# Patient Record
Sex: Male | Born: 1955 | Race: White | Hispanic: No | Marital: Single | Smoking: Former smoker
Health system: Southern US, Community
[De-identification: ages and names within clinical notes are randomized; demographics above are authoritative.]

## PROBLEM LIST (undated history)

## (undated) DIAGNOSIS — K219 Gastro-esophageal reflux disease without esophagitis: Secondary | ICD-10-CM

## (undated) DIAGNOSIS — I10 Essential (primary) hypertension: Secondary | ICD-10-CM

## (undated) DIAGNOSIS — J449 Chronic obstructive pulmonary disease, unspecified: Secondary | ICD-10-CM

## (undated) DIAGNOSIS — G4733 Obstructive sleep apnea (adult) (pediatric): Secondary | ICD-10-CM

## (undated) DIAGNOSIS — Z8619 Personal history of other infectious and parasitic diseases: Secondary | ICD-10-CM

## (undated) DIAGNOSIS — E785 Hyperlipidemia, unspecified: Secondary | ICD-10-CM

## (undated) HISTORY — DX: Essential (primary) hypertension: I10

## (undated) HISTORY — PX: ORTHOPEDIC SURGERY: SHX850

## (undated) HISTORY — DX: Gastro-esophageal reflux disease without esophagitis: K21.9

## (undated) HISTORY — DX: Hyperlipidemia, unspecified: E78.5

## (undated) HISTORY — DX: Obstructive sleep apnea (adult) (pediatric): G47.33

## (undated) HISTORY — PX: CATARACT EXTRACTION, BILATERAL: SHX1313

## (undated) HISTORY — DX: Personal history of other infectious and parasitic diseases: Z86.19

## (undated) HISTORY — PX: EYE SURGERY: SHX253

---

## 2000-08-08 ENCOUNTER — Encounter (INDEPENDENT_AMBULATORY_CARE_PROVIDER_SITE_OTHER): Payer: Self-pay

## 2000-08-08 ENCOUNTER — Ambulatory Visit (HOSPITAL_COMMUNITY): Admission: RE | Admit: 2000-08-08 | Discharge: 2000-08-08 | Payer: Self-pay | Admitting: Gastroenterology

## 2004-08-25 ENCOUNTER — Emergency Department (HOSPITAL_COMMUNITY): Admission: EM | Admit: 2004-08-25 | Discharge: 2004-08-25 | Payer: Self-pay | Admitting: Emergency Medicine

## 2008-08-04 ENCOUNTER — Inpatient Hospital Stay (HOSPITAL_COMMUNITY): Admission: EM | Admit: 2008-08-04 | Discharge: 2008-08-05 | Payer: Self-pay | Admitting: Emergency Medicine
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
## 2011-03-15 ENCOUNTER — Emergency Department (HOSPITAL_COMMUNITY)
Admission: EM | Admit: 2011-03-15 | Discharge: 2011-03-16 | Disposition: A | Discharge status: Home / Self Care | Payer: Self-pay | Attending: Emergency Medicine | Admitting: Emergency Medicine

## 2011-03-15 DIAGNOSIS — W540XXA Bitten by dog, initial encounter: Secondary | ICD-10-CM | POA: Insufficient documentation

## 2011-03-15 DIAGNOSIS — S01309A Unspecified open wound of unspecified ear, initial encounter: Secondary | ICD-10-CM | POA: Insufficient documentation

## 2011-06-22 LAB — POCT I-STAT, CHEM 8
Calcium, Ion: 1.04 — ABNORMAL LOW
HCT: 52
Hemoglobin: 17.7 — ABNORMAL HIGH
Sodium: 139

## 2011-06-22 LAB — TYPE AND SCREEN

## 2011-06-22 LAB — RAPID URINE DRUG SCREEN, HOSP PERFORMED: Benzodiazepines: POSITIVE — AB

## 2013-06-10 ENCOUNTER — Ambulatory Visit
Admission: RE | Admit: 2013-06-10 | Discharge: 2013-06-10 | Disposition: A | Discharge status: Home / Self Care | Payer: BC Managed Care – PPO | Source: Ambulatory Visit | Attending: Nurse Practitioner | Admitting: Nurse Practitioner

## 2013-06-10 ENCOUNTER — Other Ambulatory Visit: Payer: Self-pay | Admitting: Nurse Practitioner

## 2013-06-10 DIAGNOSIS — F17200 Nicotine dependence, unspecified, uncomplicated: Secondary | ICD-10-CM

## 2013-06-10 DIAGNOSIS — Z139 Encounter for screening, unspecified: Secondary | ICD-10-CM

## 2013-11-14 ENCOUNTER — Ambulatory Visit: Payer: Self-pay | Admitting: Podiatry

## 2013-11-20 ENCOUNTER — Encounter: Payer: Self-pay | Admitting: Podiatry

## 2013-11-20 ENCOUNTER — Ambulatory Visit (INDEPENDENT_AMBULATORY_CARE_PROVIDER_SITE_OTHER): Payer: BC Managed Care – PPO

## 2013-11-20 ENCOUNTER — Ambulatory Visit (INDEPENDENT_AMBULATORY_CARE_PROVIDER_SITE_OTHER): Payer: BC Managed Care – PPO | Admitting: Podiatry

## 2013-11-20 VITALS — BP 170/94 | HR 83 | Resp 18 | Ht 74.0 in | Wt 201.0 lb

## 2013-11-20 DIAGNOSIS — M79609 Pain in unspecified limb: Secondary | ICD-10-CM

## 2013-11-20 DIAGNOSIS — M7750 Other enthesopathy of unspecified foot: Secondary | ICD-10-CM

## 2013-11-20 DIAGNOSIS — M722 Plantar fascial fibromatosis: Secondary | ICD-10-CM

## 2013-11-20 DIAGNOSIS — M79606 Pain in leg, unspecified: Secondary | ICD-10-CM

## 2013-11-20 DIAGNOSIS — B372 Candidiasis of skin and nail: Secondary | ICD-10-CM

## 2013-11-20 MED ORDER — DICLOFENAC SODIUM 75 MG PO TBEC: 75.0000 mg | DELAYED_RELEASE_TABLET | Freq: Two times a day (BID) | ORAL | Status: DC

## 2013-11-20 MED ORDER — TERBINAFINE HCL 250 MG PO TABS: 250.0000 mg | ORAL_TABLET | Freq: Every day | ORAL | Status: DC

## 2013-11-20 MED ORDER — TRIAMCINOLONE ACETONIDE 10 MG/ML IJ SUSP
10.0000 mg | Freq: Once | INTRAMUSCULAR | Status: AC
  Administered 2013-11-20: 10 mg

## 2013-11-20 NOTE — Patient Instructions (Signed)

## 2013-11-20 NOTE — Progress Notes (Signed)
   Subjective:    Patient ID: [name], male    DOB: 1955-11-10, 58 y.o.   MRN: [id number]  HPI N B/L foot pain        L left dorsal midfoot, and right heel and inferior arch         D and O over 1 year        C left - swelling, pain             Right - pain         A left and right worse after rest and in the morning, but left has problems when stooping or climbing        T left foot lateral and left heel were injected, Ibuprofen and topical analgesic cream    Review of Systems  Constitutional: Negative.   HENT: Negative.   Eyes:       Cataracts and macular degeneration  Respiratory: Negative.   Cardiovascular: Negative.   Gastrointestinal: Negative.   Endocrine: Negative.   Musculoskeletal: Negative.   Skin: Positive for rash.       Athletes foot using Lotrimin   Allergic/Immunologic: Negative.   Neurological: Negative.        Objective:   Physical Exam        Assessment & Plan:

## 2013-11-21 NOTE — Progress Notes (Signed)
Subjective:     Patient ID: [name], male   DOB: 04-Aug-1956, 58 y.o.   MRN: [id number]  HPI patient presents stating he has been having heel pain in his right heel for about 6 months history of heel pain in his left heel with pain now in the dorsum of the foot and a history of injections by another doctor and skin condition left plantar foot and digits of several months duration   Review of Systems  All other systems reviewed and are negative.       Objective:   Physical Exam  Nursing note and vitals reviewed. Constitutional: He is oriented to person, place, and time.  Cardiovascular: Intact distal pulses.   Musculoskeletal: Normal range of motion.  Neurological: He is oriented to person, place, and time.  Skin: Skin is warm.   neurovascular status intact with range of motion adequate and mild equinus condition noted of both feet. Muscle strength adequate and I noted pain in the plantar heel right of a intense nature moderate discomfort in the left plantar heel and quite a bit of discomfort across the midtarsal joint left with inflammation noted. There is a patch of skin approximately 5 x 5 cm plantar aspect left arch and the digits have discoloration noted into through 5     Assessment:     Plantar fasciitis left heel with inflammation and probable compensatory tendinitis and inflammation dorsal left foot secondary to heel pain left along with dermatitis and mycotic infection left plantar foot and digits    Plan:     H&P and x-rays reviewed. Injected the right plantar heel 3 mg Kenalog 5 mg Xylocaine Marcaine mixture and the dorsum left foot 3 mg Kenalog 5 mg Xylocaine Marcaine mixture and dispensed fascial brace for the right foot. Discussed long-term orthotics and reviewed orthotic treatment and placed on full tear and 75 mg twice a day and Lamisil 250 mg daily for 30 days

## 2013-11-27 ENCOUNTER — Ambulatory Visit: Payer: BC Managed Care – PPO | Admitting: Podiatry

## 2013-12-04 ENCOUNTER — Ambulatory Visit (INDEPENDENT_AMBULATORY_CARE_PROVIDER_SITE_OTHER): Payer: BC Managed Care – PPO | Admitting: Podiatry

## 2013-12-04 VITALS — BP 150/96 | HR 87 | Resp 16 | Ht 72.0 in | Wt 200.0 lb

## 2013-12-04 DIAGNOSIS — M722 Plantar fascial fibromatosis: Secondary | ICD-10-CM

## 2013-12-04 NOTE — Progress Notes (Signed)
Subjective:     Patient ID: [name], male   DOB: 1956-06-28, 58 y.o.   MRN: [id number]  HPI patient presents stating my heels are feeling better but I still have pain if him on them too much or if I am to act   Review of Systems     Objective:   Physical Exam Neurovascular status unchanged with significant diminishment of pain in the plantar heel region of both feet with inflammation still noted upon deep palpation    Assessment:     Plantar fasciitis improved both feet but still present    Plan:     Explained the biomechanical nature of his condition and scanned for custom orthotics to reduce stress against his heels. Continue home physical therapy and brace usage and reappoint when they are complete

## 2013-12-27 ENCOUNTER — Ambulatory Visit (INDEPENDENT_AMBULATORY_CARE_PROVIDER_SITE_OTHER): Payer: BC Managed Care – PPO | Admitting: *Deleted

## 2013-12-27 DIAGNOSIS — M722 Plantar fascial fibromatosis: Secondary | ICD-10-CM

## 2013-12-27 NOTE — Progress Notes (Signed)
Dispensed patient's orthotics with oral and written instructions for wearing. Patient will follow up with Dr. Regal in 1 month for an orthotic check. 

## 2013-12-27 NOTE — Patient Instructions (Signed)

## 2014-01-15 ENCOUNTER — Ambulatory Visit: Payer: BC Managed Care – PPO | Admitting: Podiatry

## 2014-01-22 ENCOUNTER — Ambulatory Visit: Payer: BC Managed Care – PPO | Admitting: Podiatry

## 2014-01-27 ENCOUNTER — Ambulatory Visit (INDEPENDENT_AMBULATORY_CARE_PROVIDER_SITE_OTHER): Payer: BC Managed Care – PPO | Admitting: Podiatry

## 2014-01-27 ENCOUNTER — Encounter: Payer: Self-pay | Admitting: Podiatry

## 2014-01-27 VITALS — BP 165/95 | HR 84 | Resp 16

## 2014-01-27 DIAGNOSIS — M722 Plantar fascial fibromatosis: Secondary | ICD-10-CM

## 2014-01-27 MED ORDER — DICLOFENAC SODIUM 75 MG PO TBEC: 75.0000 mg | DELAYED_RELEASE_TABLET | Freq: Two times a day (BID) | ORAL | Status: DC

## 2014-01-27 NOTE — Progress Notes (Signed)
Subjective:     Patient ID: [name], male   DOB: 06/22/1956, 58 y.o.   MRN: [id number]  HPI patient states my heels are much better but still gets sore at the end of the day and the oral medicine seems to be helping   Review of Systems     Objective:   Physical Exam Neurovascular status intact no health history changes noted with minimal discomfort upon palpation of the plantar fascia    Assessment:     Improved plantar fasciitis of the heel region both feet    Plan:     Continue tear and at this time and advised on physical therapy anti-inflammatories and continued orthotic usage. Patient will be seen back if symptoms indicate

## 2014-01-28 ENCOUNTER — Encounter: Payer: Self-pay | Admitting: Podiatry

## 2014-01-28 NOTE — Progress Notes (Signed)
Prescription refill came over fax for lamisil 250 mg , per dr Charlsie Merlesregal no more

## 2014-02-19 ENCOUNTER — Ambulatory Visit: Payer: BC Managed Care – PPO | Admitting: Podiatry

## 2014-02-26 ENCOUNTER — Ambulatory Visit (INDEPENDENT_AMBULATORY_CARE_PROVIDER_SITE_OTHER): Payer: BC Managed Care – PPO | Admitting: Podiatry

## 2014-02-26 ENCOUNTER — Encounter: Payer: Self-pay | Admitting: Podiatry

## 2014-02-26 VITALS — BP 106/70 | HR 85 | Resp 12

## 2014-02-26 DIAGNOSIS — M722 Plantar fascial fibromatosis: Secondary | ICD-10-CM

## 2014-02-26 MED ORDER — TRIAMCINOLONE ACETONIDE 10 MG/ML IJ SUSP
10.0000 mg | Freq: Once | INTRAMUSCULAR | Status: AC
  Administered 2014-02-26: 10 mg

## 2014-02-26 NOTE — Progress Notes (Signed)
Subjective:     Patient ID: [name], male   DOB: 05/19/1956, 58 y.o.   MRN: [id number]  HPI patient states my heel has started to hurt again and I know I may need some kind of support   Review of Systems     Objective:   Physical Exam Neurovascular status intact with discomfort in the plantar heel region right at the insertional point in the calcaneus with biomechanical flattening the arch upon weight    Assessment:     Plan her fasciitis right with inflammation and fluid around the medial band    Plan:     H&P and x-ray reviewed and today I injected the right plantar fascia 3 mg Kenalog 5 mg like Marcaine mixture and scanned for custom orthotics to reduce all plantar pressure on the arch and foot

## 2014-03-06 ENCOUNTER — Emergency Department (HOSPITAL_COMMUNITY): Payer: BC Managed Care – PPO

## 2014-03-06 ENCOUNTER — Emergency Department (HOSPITAL_COMMUNITY)
Admission: EM | Admit: 2014-03-06 | Discharge: 2014-03-06 | Disposition: A | Discharge status: Home / Self Care | Payer: BC Managed Care – PPO | Attending: Emergency Medicine | Admitting: Emergency Medicine

## 2014-03-06 ENCOUNTER — Encounter (HOSPITAL_COMMUNITY): Payer: Self-pay | Admitting: Emergency Medicine

## 2014-03-06 DIAGNOSIS — K219 Gastro-esophageal reflux disease without esophagitis: Secondary | ICD-10-CM | POA: Insufficient documentation

## 2014-03-06 DIAGNOSIS — I10 Essential (primary) hypertension: Secondary | ICD-10-CM | POA: Insufficient documentation

## 2014-03-06 DIAGNOSIS — Z792 Long term (current) use of antibiotics: Secondary | ICD-10-CM | POA: Insufficient documentation

## 2014-03-06 DIAGNOSIS — R059 Cough, unspecified: Secondary | ICD-10-CM | POA: Insufficient documentation

## 2014-03-06 DIAGNOSIS — Z79899 Other long term (current) drug therapy: Secondary | ICD-10-CM | POA: Insufficient documentation

## 2014-03-06 DIAGNOSIS — A770 Spotted fever due to Rickettsia rickettsii: Secondary | ICD-10-CM

## 2014-03-06 DIAGNOSIS — Z791 Long term (current) use of non-steroidal anti-inflammatories (NSAID): Secondary | ICD-10-CM | POA: Insufficient documentation

## 2014-03-06 DIAGNOSIS — Z87891 Personal history of nicotine dependence: Secondary | ICD-10-CM | POA: Insufficient documentation

## 2014-03-06 DIAGNOSIS — R05 Cough: Secondary | ICD-10-CM | POA: Insufficient documentation

## 2014-03-06 DIAGNOSIS — A938 Other specified arthropod-borne viral fevers: Secondary | ICD-10-CM | POA: Insufficient documentation

## 2014-03-06 DIAGNOSIS — J441 Chronic obstructive pulmonary disease with (acute) exacerbation: Secondary | ICD-10-CM

## 2014-03-06 HISTORY — DX: Chronic obstructive pulmonary disease, unspecified: J44.9

## 2014-03-06 LAB — I-STAT TROPONIN, ED
TROPONIN I, POC: 0 ng/mL (ref 0.00–0.08)
TROPONIN I, POC: 0.01 ng/mL (ref 0.00–0.08)

## 2014-03-06 LAB — BASIC METABOLIC PANEL
BUN: 15 mg/dL (ref 6–23)
CHLORIDE: 99 meq/L (ref 96–112)
CO2: 18 mEq/L — ABNORMAL LOW (ref 19–32)
Calcium: 9.2 mg/dL (ref 8.4–10.5)
Creatinine, Ser: 0.75 mg/dL (ref 0.50–1.35)
GFR calc Af Amer: >90 mL/min (ref >90)
GFR calc non Af Amer: >90 mL/min (ref >90)
Glucose, Bld: 133 mg/dL — ABNORMAL HIGH (ref 70–99)
Potassium: 4.3 mEq/L (ref 3.7–5.3)
SODIUM: 132 meq/L — AB (ref 137–147)

## 2014-03-06 LAB — CBC
HEMATOCRIT: 35.1 % — AB (ref 39.0–52.0)
HEMOGLOBIN: 12.4 g/dL — AB (ref 13.0–17.0)
MCH: 33.9 pg (ref 26.0–34.0)
MCHC: 35.3 g/dL (ref 30.0–36.0)
MCV: 95.9 fL (ref 78.0–100.0)
Platelets: 142 10*3/uL — ABNORMAL LOW (ref 150–400)
RBC: 3.66 MIL/uL — ABNORMAL LOW (ref 4.22–5.81)
RDW: 12.5 % (ref 11.5–15.5)
WBC: 9.7 10*3/uL (ref 4.0–10.5)

## 2014-03-06 MED ORDER — DOXYCYCLINE HYCLATE 100 MG PO CAPS: 100.0000 mg | ORAL_CAPSULE | Freq: Two times a day (BID) | ORAL | Status: DC

## 2014-03-06 MED ORDER — ALBUTEROL SULFATE (2.5 MG/3ML) 0.083% IN NEBU
2.5000 mg | INHALATION_SOLUTION | Freq: Once | RESPIRATORY_TRACT | Status: AC
  Administered 2014-03-06: 2.5 mg via RESPIRATORY_TRACT
  Filled 2014-03-06: qty 3

## 2014-03-06 NOTE — ED Notes (Signed)
Pt states he woke up last night with SOB and has been dx with COPD. Pt states he had to use his inhalers last night, knows one of them is albuterol. Could not tell difference afterwards. Also reports 2 weeks ago pulled a tick off of his groin without any markings but wanted to make us aware.

## 2014-03-06 NOTE — ED Notes (Signed)
Patient declined wheelchair. RN escorted to exit 

## 2014-03-06 NOTE — ED Notes (Signed)
Pt ambulated to the bathroom with minimal assistance. Pulse was 110 and oxygen 97

## 2014-03-06 NOTE — ED Notes (Signed)
Pt states that he was outside in the heat and felt like he was struggling to take deep breaths.  Denies any pain.

## 2014-03-06 NOTE — ED Provider Notes (Signed)
CSN: [id number]     Arrival date & time 03/06/14  1337 History   First MD Initiated Contact with Patient 03/06/14 1743     Chief Complaint  Patient presents with  . Shortness of Breath     (Consider location/radiation/quality/duration/timing/severity/associated sxs/prior Treatment) Patient is a 58 y.o. male presenting with shortness of breath. The history is provided by the patient and the spouse.  Shortness of Breath Severity:  Moderate Onset quality:  Gradual Timing:  Constant Progression:  Worsening Chronicity:  New Relieved by:  Inhaler and rest Worsened by:  Activity Associated symptoms: cough (chronic) and wheezing   Associated symptoms: no abdominal pain, no chest pain, no fever, no headaches and no vomiting     58 yo male pw fatigue and SOB. Tick exposure 1-2 weeks ago. Had been there for about 7-10 days per wife. Did not initially realize it was a tick. Removed. Fatigue since this time. Last night felt worsening sob from chronic sob (COPD). Chronic cough. No fevers. No chest pain. No lower extremity edema or leg pain. No h/o DVT/PE.  Dyspnea worse on exertion.  Tried home albuterol with mild relief.   Past Medical History  Diagnosis Date  . Hypertension   . GERD (gastroesophageal reflux disease)   . COPD (chronic obstructive pulmonary disease)    Past Surgical History  Procedure Laterality Date  . Orthopedic surgery Bilateral     pt states fracture repairs to arms and legs.  . Eye surgery Bilateral   . Cataract extraction, bilateral     Family History  Problem Relation Age of Onset  . Heart disease Mother   . Hypertension Father    History  Substance Use Topics  . Smoking status: Former Games developer  . Smokeless tobacco: Not on file  . Alcohol Use: 3.0 oz/week    5 Cans of beer per week     Comment: daily    Review of Systems  Constitutional: Negative for fever and chills.  HENT: Negative for congestion and rhinorrhea.   Eyes: Negative for pain and visual  disturbance.  Respiratory: Positive for cough (chronic), shortness of breath and wheezing.   Cardiovascular: Negative for chest pain and leg swelling.  Gastrointestinal: Negative for nausea, vomiting, abdominal pain and diarrhea.  Genitourinary: Negative for flank pain and difficulty urinating.  Musculoskeletal: Negative for back pain.  Neurological: Negative for dizziness and headaches.  All other systems reviewed and are negative.     Allergies  Review of patient's allergies indicates no known allergies.  Home Medications   Prior to Admission medications   Medication Sig Start Date End Date Taking? Authorizing Provider  Aclidinium Bromide (TUDORZA PRESSAIR IN) Inhale 1 puff into the lungs daily.   Yes Historical Provider, MD  albuterol (PROVENTIL HFA;VENTOLIN HFA) 108 (90 BASE) MCG/ACT inhaler Inhale 2 puffs into the lungs every 6 (six) hours as needed for wheezing or shortness of breath.   Yes Historical Provider, MD  diclofenac (VOLTAREN) 75 MG EC tablet Take 75 mg by mouth 2 (two) times daily. In the morning and in the afternoon   Yes Historical Provider, MD  hydroxypropyl methylcellulose (ISOPTO TEARS) 2.5 % ophthalmic solution Place 2 drops into the left eye daily as needed for dry eyes.   Yes Historical Provider, MD  ibuprofen (ADVIL,MOTRIN) 200 MG tablet Take 400 mg by mouth every 6 (six) hours as needed for moderate pain.   Yes Historical Provider, MD  lisinopril (PRINIVIL,ZESTRIL) 20 MG tablet Take 20 mg by mouth every morning.  Yes Historical Provider, MD  Multiple Vitamins-Minerals (MEGA MULTI MEN PO) Take 1 tablet by mouth daily.   Yes Historical Provider, MD  pantoprazole (PROTONIX) 40 MG tablet Take 40 mg by mouth every morning.   Yes Historical Provider, MD  doxycycline (VIBRAMYCIN) 100 MG capsule Take 1 capsule (100 mg total) by mouth 2 (two) times daily. One po bid x 7 days 03/06/14   Stevie Kernyan McLennan, MD   BP 127/75  Pulse 119  Temp(Src) 98.5 F (36.9 C) (Oral)  Resp  18  SpO2 97% Physical Exam  Nursing note and vitals reviewed. Constitutional: He is oriented to person, place, and time. He appears well-developed and well-nourished. No distress.  HENT:  Head: Normocephalic and atraumatic.  Eyes: Conjunctivae are normal. Right eye exhibits no discharge. Left eye exhibits no discharge.  Neck: No tracheal deviation present.  Cardiovascular: Normal rate, regular rhythm, normal heart sounds and intact distal pulses.   Pulmonary/Chest: Effort normal. No stridor. No respiratory distress. He has wheezes (mild. diffuse). He has no rales.  Abdominal: Soft. He exhibits no distension. There is no tenderness. There is no guarding.  Musculoskeletal: He exhibits no edema and no tenderness.  Neurological: He is alert and oriented to person, place, and time.  Skin: Skin is warm and dry.  Psychiatric: He has a normal mood and affect. His behavior is normal.    ED Course  Procedures (including critical care time) Labs Review Labs Reviewed  BASIC METABOLIC PANEL - Abnormal; Notable for the following:    Sodium 132 (*)    CO2 18 (*)    Glucose, Bld 133 (*)    All other components within normal limits  CBC - Abnormal; Notable for the following:    RBC 3.66 (*)    Hemoglobin 12.4 (*)    HCT 35.1 (*)    Platelets 142 (*)    All other components within normal limits  I-STAT TROPOININ, ED  I-STAT TROPOININ, ED    Imaging Review Dg Chest 2 View (if Patient Has Fever And/or Copd)  03/06/2014   CLINICAL DATA:  Productive cough.  Emphysema.  EXAM: CHEST  2 VIEW  COMPARISON:  06/10/2013  FINDINGS: The cardiac silhouette, mediastinal and hilar contours are within normal limits and stable. There is mild tortuosity of the thoracic aorta. Mild emphysematous changes are again demonstrated. No definite acute overlying pulmonary process. There is artifact overlying the left lower chest. The bony thorax is intact.  IMPRESSION: Emphysematous changes and pulmonary scarring but no  definite acute overlying pulmonary process.   Electronically Signed   By: Loralie ChampagneMark  Gallerani M.D.   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 Onset after tick exposure.  HDS, af. Except for tachycardic from 90-110 (triage HR 119, improved upon my evaluation).  Mild wheezing. Resolved after albuterol neb. Not hypoxic during ED course.  No CP. EKG reassuring. Troponin neg x2. Doubt ACS Low risk for PE. Do not believe further imaging indicated at this time.  Tachycardia likely 2/2 beta agonist use.  Hyponatremia and thrombocytopenia on labs. Findings in conjunction with general illness after tick exposure raise suspicion for RMSF. Will treat with doxycycline.   Patient discharged home. Return precautions given. To follow up with PCP. Patient and spouse in agreement with plan.  Discussed case with  Dr. Rubin PayorPickering who is in agreement with assessment and plan.     Stevie Kernyan McLennan, MD 03/07/14 [phone]

## 2014-03-06 NOTE — Discharge Instructions (Signed)
Chronic Obstructive Pulmonary Disease Exacerbation Chronic obstructive pulmonary disease (COPD) is a common lung condition in which airflow from the lungs is limited. COPD is a general term that can be used to describe many different lung problems that limit airflow, including chronic bronchitis and emphysema. COPD exacerbations are episodes when breathing symptoms become much worse and require extra treatment. Without treatment, COPD exacerbations can be life threatening, and frequent COPD exacerbations can cause further damage to your lungs. CAUSES   Respiratory infections.   Exposure to smoke.   Exposure to air pollution, chemical fumes, or dust. Sometimes there is no apparent cause or trigger. RISK FACTORS  Smoking cigarettes.  Older age.  Frequent prior COPD exacerbations. SIGNS AND SYMPTOMS   Increased coughing.   Increased thick spit (sputum) production.   Increased wheezing.   Increased shortness of breath.   Rapid breathing.   Chest tightness. DIAGNOSIS  Your medical history, a physical exam, and tests will help your health care provider make a diagnosis. Tests may include:  A chest X-ray.  Basic lab tests.  Sputum testing.  An arterial blood gas test. TREATMENT  Depending on the severity of your COPD exacerbation, you may need to be admitted to a hospital for treatment. Some of the treatments commonly used to treat COPD exacerbations are:   Antibiotic medicines.   Bronchodilators. These are drugs that expand the air passages. They may be given with an inhaler or nebulizer. Spacer devices may be needed to help improve drug delivery.  Corticosteroid medicines.  Supplemental oxygen therapy.  HOME CARE INSTRUCTIONS   Do not smoke. Quitting smoking is very important to prevent COPD from getting worse and exacerbations from happening as often.  Avoid exposure to all substances that irritate the airway, especially to tobacco smoke.   If prescribed,  take your antibiotics as directed. Finish them even if you start to feel better.  Only take over-the-counter or prescription medicines as directed by your health care provider.It is important to use correct technique with inhaled medicines.  Drink enough fluids to keep your urine clear or pale yellow (unless you have a medical condition that requires fluid restriction).  Use a cool mist vaporizer. This makes it easier to clear your chest when you cough.   If you have a home nebulizer and oxygen, continue to use them as directed.   Maintain all necessary vaccinations to prevent infections.   Exercise regularly.   Eat a healthy diet.   Keep all follow-up appointments as directed by your health care provider. SEEK IMMEDIATE MEDICAL CARE IF:  You have worsening shortness of breath.   You have trouble talking.   You have severe chest pain.  You have blood in your sputum.  You have a fever.  You have weakness, vomit repeatedly, or faint.   You feel confused.   You continue to get worse. MAKE SURE YOU:   Understand these instructions.  Will watch your condition.  Will get help right away if you are not doing well or get worse. Document Released: 07/03/2007 Document Revised: 06/26/2013 Document Reviewed: 05/10/2013 [facility] Patient Information 2015 [place], [place]. This information is not intended to replace advice given to you by your health care provider. Make sure you discuss any questions you have with your health care provider.  [place] Spotted Fever [place] Spotted Fever (RMSF) is the oldest known tick-borne disease of people in the [place]. This disease was named because it was first described [place]  who had an illness characterized by a rash with red-purple-black spots. This disease is caused by a rickettsia (Rickettsia rickettsii), a bacteria carried by the tick. The [facility] wood tick and the  American dog tick, acquire and transmit the RMSF bacteria (pictures NOT actual size). When a larval, nymphal or adult tick feeds on an infected rodent or larger animal, the tick can become infected. Infected adult ticks then feed on people who may then get RMSF. The tick transmits the disease to humans during a prolonged period of feeding that lasts many hours, days or even a couple weeks. The bite is painless and frequently goes unnoticed. An infected male tick may also pass the rickettsial bacteria to her eggs that then may mature to be infected adult ticks. The rickettsia that causes RMSF can also get into a person's body through damaged skin. A tick bite is not necessary. People can get RMSF if they crush a tick and get it's blood or body fluids on their skin through a small cut or sore.  DIAGNOSIS Diagnosis is made by laboratory tests.  TREATMENT Treatment is with antibiotics (medications that kill rickettsia and other bacteria). Immediate treatment usually prevents death. GEOGRAPHIC RANGE This disease was reported only in the [facility] until 1931. RMSF has more recently been described among individuals in all states except [place], [place] and [place]. The highest reported incidences of RMSF now occur among residents of [place], [place], [place] and [address]. TIME OF YEAR  Most cases are diagnosed during late spring and summer when ticks are most active. However, especially in the warmer [place] states, a few cases occur during the winter. SYMPTOMS   Symptoms of RMSF begin from 2 to 14 days after a tick bite. The most common early symptoms are fever, muscle aches and headache followed by nausea (feeling sick to your stomach) or vomiting.  The RMSF rash is typically delayed until 3 or more days after symptom onset, and eventually develops in 9 of 10 infected patients by the 5th day of illness. If the disease is not treated it can cause death. If you get a fever, headache, muscle  aches, rash, nausea or vomiting within 2 weeks of a possible tick bite or exposure you should see your caregiver immediately. PREVENTION Ticks prefer to hide in shady, moist ground litter. They can often be found above the ground clinging to tall grass, brush, shrubs and low tree branches. They also inhabit lawns and gardens, especially at the edges of woodlands and around old stone walls. Within the areas where ticks generally live, no naturally vegetated area can be considered completely free of infected ticks. The best precaution against RMSF is to avoid contact with soil, leaf litter and vegetation as much as possible in tick infested areas. For those who enjoy gardening or walking in their yards, clear brush and mow tall grass around houses and at the edges of gardens. This may help reduce the tick population in the immediate area. Applications of chemical insecticides by a licensed professional in the spring (late May) and Fall (September) will also control ticks, especially in heavily infested areas. Treatment will never get rid of all the ticks. Getting rid of small animal populations that host ticks will also decrease the tick population. When working in the garden, [employer], or handling soil and vegetation, wear light-colored protective clothing and gloves. Spot-check often to prevent ticks from reaching the skin. Ticks cannot jump or fly. They will not drop from an above-ground perch onto  a passing animal. Once a tick gains access to human skin it climbs upward until it reaches a more protected area. For example, the back of the knee, groin, navel, armpit, ears or nape of the neck. It then begins the slow process of embedding itself in the skin. Campers, hikers, field workers, and others who spend time in wooded, brushy or tall grassy areas can avoid exposure to ticks by using the following precautions:  Wear light-colored clothing with a tight weave to spot ticks more easily and prevent  contact with the skin.  Wear long pants tucked into socks, long-sleeved shirts tucked into pants and enclosed shoes or boots along with insect repellent.  Spray clothes with insect repellent containing either DEET or Permethrin. Only DEET can be used on exposed skin. Follow the manufacturer's directions carefully.  Wear a hat and keep long hair pulled back.  Stay on cleared, well-worn trails whenever possible.  Spot-check yourself and others often for the presence of ticks on clothes. If you find one, there are likely to be others. Check thoroughly.  Remove clothes after leaving tick-infested areas. If possible, wash them to eliminate any unseen ticks. Check yourself, your children and any pets from head to toe for the presence of ticks.  Shower and shampoo. You can greatly reduce your chances of contracting RMSF if you remove attached ticks as soon as possible. Regular checks of the body, including all body sites covered by hair (head, armpits, genitals), allow removal of the tick before rickettsial transmission. To remove an attached tick, use a forceps or tweezers to detach the intact tick without leaving mouth parts in the skin. The tick bite wound should be cleansed after tick removal. Remember the most common symptoms of RMSF are fever, muscle aches, headache and nausea or vomiting with a later onset of rash. If you get these symptoms after a tick bite and while living in an area where RMSF is found, RMSF should be suspected. If the disease is not treated, it can cause death. See your caregiver immediately if you get these symptoms. Do this even if not aware of a tick bite. Document Released: 12/18/2000 Document Revised: 11/28/2011 Document Reviewed: 08/10/2009 [facility] Patient Information 2015 [place], [place]. This information is not intended to replace advice given to you by your health care provider. Make sure you discuss any questions you have with your health care provider.

## 2014-03-10 NOTE — ED Provider Notes (Signed)
I saw and evaluated the patient, reviewed the resident's note and I agree with the findings and plan.   EKG Interpretation   Date/Time:  Thursday March 06 2014 13:46:48 EDT Ventricular Rate:  118 PR Interval:  162 QRS Duration: 84 QT Interval:  316 QTC Calculation: 442 R Axis:   92 Text Interpretation:   Suspect arm lead reversal, interpretation  assumes no reversal Sinus tachycardia Rightward axis Possible Anterior  infarct , age undetermined Reconfirmed by [facility] Surgery CenterCKERING  MD, NATHAN [phone]([id number]) on  03/10/2014 6:58:03 AM     Patient shortness of breath. Tachycardia has improved. His history of COPD. He had tick exposure. We'll treat for discharge  Juliet Rudeathan R. Rubin PayorPickering, MD 03/10/14 [phone]

## 2014-03-19 ENCOUNTER — Ambulatory Visit: Payer: BC Managed Care – PPO | Admitting: Podiatry

## 2014-04-07 ENCOUNTER — Ambulatory Visit: Payer: BC Managed Care – PPO | Admitting: Podiatry

## 2014-04-10 ENCOUNTER — Ambulatory Visit (INDEPENDENT_AMBULATORY_CARE_PROVIDER_SITE_OTHER): Payer: BC Managed Care – PPO | Admitting: Podiatry

## 2014-04-10 ENCOUNTER — Encounter: Payer: Self-pay | Admitting: Podiatry

## 2014-04-10 VITALS — BP 121/86 | HR 90 | Resp 17

## 2014-04-10 DIAGNOSIS — M722 Plantar fascial fibromatosis: Secondary | ICD-10-CM

## 2014-04-10 MED ORDER — TRIAMCINOLONE ACETONIDE 10 MG/ML IJ SUSP
10.0000 mg | Freq: Once | INTRAMUSCULAR | Status: AC
  Administered 2014-04-10: 10 mg

## 2014-04-10 MED ORDER — PREDNISONE 10 MG PO TABS: ORAL_TABLET | ORAL | Status: DC

## 2014-04-11 NOTE — Progress Notes (Signed)
Subjective:     Patient ID: [name], male   DOB: 09-02-1956, 58 y.o.   MRN: [id number]  HPI patient states of the right heel is still really bothering him and it's worse when he is working its worse after work and it's now hurting 24 hours a day but he is very busy on cement floors   Review of Systems     Objective:   Physical Exam Neurovascular status intact with health history unchanged and exquisite discomfort plantar aspect right heel at the insertional point of the tendon into the calcaneus    Assessment:     Continue plan her fasciitis right to get irritated by extreme weightbearing hours    Plan:     We're trying to help the CPM off this area and today I did dispense air fracture walker with instructions to use as much as possible and I did dispense a fascially brace with instructions on usage. I injected the plantar fascia right 3 mg Kenalog 5 mg  Marcaine mixture and discussed continued ice therapy and supportive shoes

## 2014-04-24 ENCOUNTER — Ambulatory Visit (INDEPENDENT_AMBULATORY_CARE_PROVIDER_SITE_OTHER): Payer: BC Managed Care – PPO | Admitting: Podiatry

## 2014-04-24 ENCOUNTER — Encounter: Payer: Self-pay | Admitting: Podiatry

## 2014-04-24 VITALS — BP 137/89 | HR 91 | Resp 16

## 2014-04-24 DIAGNOSIS — M722 Plantar fascial fibromatosis: Secondary | ICD-10-CM

## 2014-04-24 NOTE — Progress Notes (Signed)
Subjective:     Patient ID: [name], male   DOB: September 26, 1955, 58 y.o.   MRN: [id number]  HPI patient states I'm doing a lot better with my foot and I feel like I'm able to walk normally without intense discomfort. States he is still using his boot and splint   Review of Systems     Objective:   Physical Exam Neurovascular status intact with muscle strength adequate in range of motion within normal limits. Plantar pain has reduced quite nicely upon palpation    Assessment:     Improved plantar fasciitis right heel    Plan:     Instructed on physical therapy continued boot usage and splint usage area reappoint for us to recheck if symptoms were to recur

## 2014-08-19 ENCOUNTER — Other Ambulatory Visit: Payer: Self-pay | Admitting: *Deleted

## 2014-08-19 MED ORDER — DICLOFENAC SODIUM 75 MG PO TBEC: 75.0000 mg | DELAYED_RELEASE_TABLET | Freq: Two times a day (BID) | ORAL | Status: DC

## 2014-08-19 NOTE — Telephone Encounter (Signed)
[facility] sent refill request for diclofenac 75 mg #60. Take one by mouth twice daily. Per dr Charlsie Merlesregal refill plus 3 refills.

## 2014-09-15 ENCOUNTER — Emergency Department (HOSPITAL_COMMUNITY): Payer: BC Managed Care – PPO

## 2014-09-15 ENCOUNTER — Emergency Department (HOSPITAL_COMMUNITY)
Admission: EM | Admit: 2014-09-15 | Discharge: 2014-09-15 | Disposition: A | Discharge status: Home / Self Care | Payer: BC Managed Care – PPO | Attending: Emergency Medicine | Admitting: Emergency Medicine

## 2014-09-15 ENCOUNTER — Encounter (HOSPITAL_COMMUNITY): Payer: Self-pay | Admitting: Family Medicine

## 2014-09-15 DIAGNOSIS — K219 Gastro-esophageal reflux disease without esophagitis: Secondary | ICD-10-CM | POA: Diagnosis not present

## 2014-09-15 DIAGNOSIS — R911 Solitary pulmonary nodule: Secondary | ICD-10-CM | POA: Insufficient documentation

## 2014-09-15 DIAGNOSIS — J441 Chronic obstructive pulmonary disease with (acute) exacerbation: Secondary | ICD-10-CM | POA: Diagnosis not present

## 2014-09-15 DIAGNOSIS — Z87891 Personal history of nicotine dependence: Secondary | ICD-10-CM | POA: Insufficient documentation

## 2014-09-15 DIAGNOSIS — Z791 Long term (current) use of non-steroidal anti-inflammatories (NSAID): Secondary | ICD-10-CM | POA: Diagnosis not present

## 2014-09-15 DIAGNOSIS — Z7952 Long term (current) use of systemic steroids: Secondary | ICD-10-CM | POA: Insufficient documentation

## 2014-09-15 DIAGNOSIS — R0602 Shortness of breath: Secondary | ICD-10-CM | POA: Diagnosis present

## 2014-09-15 DIAGNOSIS — I10 Essential (primary) hypertension: Secondary | ICD-10-CM | POA: Insufficient documentation

## 2014-09-15 DIAGNOSIS — Z79899 Other long term (current) drug therapy: Secondary | ICD-10-CM | POA: Insufficient documentation

## 2014-09-15 LAB — BASIC METABOLIC PANEL
ANION GAP: 8 (ref 5–15)
BUN: 10 mg/dL (ref 6–23)
CALCIUM: 8.6 mg/dL (ref 8.4–10.5)
CO2: 18 mmol/L — ABNORMAL LOW (ref 19–32)
Chloride: 107 mEq/L (ref 96–112)
Creatinine, Ser: 0.82 mg/dL (ref 0.50–1.35)
Glucose, Bld: 139 mg/dL — ABNORMAL HIGH (ref 70–99)
POTASSIUM: 3.9 mmol/L (ref 3.5–5.1)
SODIUM: 133 mmol/L — AB (ref 135–145)

## 2014-09-15 LAB — I-STAT TROPONIN, ED: TROPONIN I, POC: 0 ng/mL (ref 0.00–0.08)

## 2014-09-15 LAB — CBC
HCT: 42.2 % (ref 39.0–52.0)
Hemoglobin: 14.8 g/dL (ref 13.0–17.0)
MCH: 34.2 pg — ABNORMAL HIGH (ref 26.0–34.0)
MCHC: 35.1 g/dL (ref 30.0–36.0)
MCV: 97.5 fL (ref 78.0–100.0)
PLATELETS: 119 10*3/uL — AB (ref 150–400)
RBC: 4.33 MIL/uL (ref 4.22–5.81)
RDW: 12.7 % (ref 11.5–15.5)
WBC: 6.6 10*3/uL (ref 4.0–10.5)

## 2014-09-15 MED ORDER — IBUPROFEN 400 MG PO TABS
600.0000 mg | ORAL_TABLET | Freq: Once | ORAL | Status: AC
  Administered 2014-09-15: 600 mg via ORAL
  Filled 2014-09-15 (×2): qty 1

## 2014-09-15 MED ORDER — PREDNISONE 20 MG PO TABS: 20.0000 mg | ORAL_TABLET | Freq: Two times a day (BID) | ORAL | Status: DC

## 2014-09-15 MED ORDER — SODIUM CHLORIDE 0.9 % IV BOLUS (SEPSIS)
1000.0000 mL | Freq: Once | INTRAVENOUS | Status: AC
  Administered 2014-09-15: 1000 mL via INTRAVENOUS

## 2014-09-15 MED ORDER — ACETAMINOPHEN 325 MG PO TABS
650.0000 mg | ORAL_TABLET | Freq: Once | ORAL | Status: DC
  Filled 2014-09-15: qty 2

## 2014-09-15 MED ORDER — ALBUTEROL SULFATE (2.5 MG/3ML) 0.083% IN NEBU
5.0000 mg | INHALATION_SOLUTION | Freq: Once | RESPIRATORY_TRACT | Status: AC
  Administered 2014-09-15: 2.5 mg via RESPIRATORY_TRACT
  Filled 2014-09-15: qty 6

## 2014-09-15 MED ORDER — IPRATROPIUM-ALBUTEROL 0.5-2.5 (3) MG/3ML IN SOLN
3.0000 mL | Freq: Once | RESPIRATORY_TRACT | Status: AC
  Administered 2014-09-15: 3 mL via RESPIRATORY_TRACT
  Filled 2014-09-15: qty 3

## 2014-09-15 MED ORDER — PREDNISONE 20 MG PO TABS
60.0000 mg | ORAL_TABLET | Freq: Once | ORAL | Status: AC
  Administered 2014-09-15: 60 mg via ORAL
  Filled 2014-09-15: qty 3

## 2014-09-15 NOTE — ED Notes (Signed)
Per pt SOB and he woke himself up gasping for air last night. sts coughing up yellow mucous. sts severe cough. sts using puffers.

## 2014-09-15 NOTE — Discharge Instructions (Signed)
Chronic Asthmatic Bronchitis Chronic asthmatic bronchitis is a complication of persistent asthma. After a period of time with asthma, some people develop airflow obstruction that is present all the time, even when not having an asthma attack.There is also persistent inflammation of the airways, and the bronchial tubes produce more mucus. Chronic asthmatic bronchitis usually is a permanent problem with the lungs. CAUSES  Chronic asthmatic bronchitis happens most often in people who have asthma and also smoke cigarettes. Occasionally, it can happen to a person with long-standing or severe asthma even if the person is not a smoker. SIGNS AND SYMPTOMS  Chronic asthmatic bronchitis usually causes symptoms of both asthma and chronic bronchitis, including:   Coughing.  Increased sputum production.  Wheezing and shortness of breath.  Chest discomfort.  Recurring infections. DIAGNOSIS  Your health care provider will take a medical history and perform a physical exam. Chronic asthmatic bronchitis is suspected when a person with asthma has abnormal results on breathing tests (pulmonary function tests) even when breathing symptoms are at their best. Other tests, such as a chest X-ray, may be performed to rule out other conditions.  TREATMENT  Treatment involves controlling symptoms with medicine and lifestyle changes.  Your health care provider may prescribe asthma medicines, including inhaler and nebulizer medicines.  Infection can be treated with medicine to kill germs (antibiotics). Serious infections may require hospitalization. These can include:  Pneumonia.  Sinus infections.  Acute bronchitis.   Preventing infection and hospitalization is very important. Get an influenza vaccination every year as directed by your health care provider. Ask your health care provider whether you need a pneumonia vaccine.  Ask your health care provider whether you would benefit from a pulmonary  rehabilitation program. HOME CARE INSTRUCTIONS  Take medicines only as directed by your health care provider.  If you are a cigarette smoker, the most important thing that you can do is quit. Talk to your health care provider for help with quitting smoking.  Avoid pollen, dust, animal dander, molds, smoke, and other things that cause attacks.  Regular exercise is very important to help you feel better. Discuss possible exercise routines with your health care provider.  If animal dander is the cause of asthma, you may not be able to keep pets.  It is important that you:  Become educated about your medical condition.  Participate in maintaining wellness.  Seek medical care as directed. Delay in seeking medical care could cause permanent injury and may be a risk to your life. SEEK MEDICAL CARE IF:  You have wheezing and shortness of breath even if taking medicine to prevent attacks.  You have muscle aches, chest pain, or thickening of sputum.  Your sputum changes from clear or white to yellow, green, gray, or bloody. SEEK IMMEDIATE MEDICAL CARE IF:  Your usual medicines do not stop your wheezing.  You have increased coughing or shortness of breath or both.  You have increased difficulty breathing.  You have any problems from the medicine you are taking, such as a rash, itching, swelling, or trouble breathing. MAKE SURE YOU:   Understand these instructions.  Will watch your condition.  Will get help right away if you are not doing well or get worse. Document Released: 06/23/2006 Document Revised: 01/20/2014 Document Reviewed: 10/14/2013 Exit[employer] Patient Information 2015 [employer]. This information is not intended to replace advice given to you by your health care provider. Make sure you discuss any questions you have with your health care provider.  

## 2014-09-15 NOTE — ED Provider Notes (Signed)
CSN: [id number]     Arrival date & time 09/15/14  [id number] History   First MD Initiated Contact with Patient 09/15/14 [phone]     Chief Complaint  Patient presents with  . Shortness of Breath     (Consider location/radiation/quality/duration/timing/severity/associated sxs/prior Treatment) HPI   [name] is a 58 y.o. male who presents for evaluation of shortness of breath which began during the night, while asleep.  He uses albuterol inhaler with partial relief.  He went to work but was too short of breath to continue soaking here.  Had myalgias and cough with yellow mucus for several days.  He denies nausea, vomiting, weakness or dizziness.  He is taking his usual medications, without relief.  There are no other known modifying factors.   Past Medical History  Diagnosis Date  . Hypertension   . GERD (gastroesophageal reflux disease)   . COPD (chronic obstructive pulmonary disease)    Past Surgical History  Procedure Laterality Date  . Orthopedic surgery Bilateral     pt states fracture repairs to arms and legs.  . Eye surgery Bilateral   . Cataract extraction, bilateral     Family History  Problem Relation Age of Onset  . Heart disease Mother   . Hypertension Father    History  Substance Use Topics  . Smoking status: Former Games developermoker  . Smokeless tobacco: Not on file  . Alcohol Use: 3.0 oz/week    5 Cans of beer per week     Comment: daily    Review of Systems  All other systems reviewed and are negative.     Allergies  Review of patient's allergies indicates no known allergies.  Home Medications   Prior to Admission medications   Medication Sig Start Date End Date Taking? Authorizing Provider  Aclidinium Bromide (TUDORZA PRESSAIR IN) Inhale 1 puff into the lungs daily.   Yes Historical Provider, MD  albuterol (PROVENTIL HFA;VENTOLIN HFA) 108 (90 BASE) MCG/ACT inhaler Inhale 2 puffs into the lungs every 6 (six) hours as needed for wheezing or shortness of breath.    Yes Historical Provider, MD  diclofenac (VOLTAREN) 75 MG EC tablet Take 1 tablet (75 mg total) by mouth 2 (two) times daily. In the morning and in the afternoon Patient taking differently: Take 75 mg by mouth daily. In the morning and in the afternoon 08/19/14  Yes Charles PeriNorman S Brewer, DPM  ibuprofen (ADVIL,MOTRIN) 200 MG tablet Take 800 mg by mouth every 6 (six) hours as needed for moderate pain.    Yes Historical Provider, MD  lisinopril (PRINIVIL,ZESTRIL) 20 MG tablet Take 20 mg by mouth every morning.   Yes Historical Provider, MD  Multiple Vitamins-Minerals (MEGA MULTI MEN PO) Take 1 tablet by mouth daily.   Yes Historical Provider, MD  pantoprazole (PROTONIX) 40 MG tablet Take 40 mg by mouth every morning.   Yes Historical Provider, MD  Phenylephrine-DM-GG-APAP (MUCINEX FAST-MAX SEVERE COLD) 5-10-200-325 MG/10ML LIQD Take 20 mLs by mouth every 6 (six) hours as needed (cough/cold).   Yes Historical Provider, MD  doxycycline (VIBRAMYCIN) 100 MG capsule Take 1 capsule (100 mg total) by mouth 2 (two) times daily. One po bid x 7 days Patient not taking: Reported on 09/15/2014 03/06/14   Charles Kernyan McLennan, MD  hydroxypropyl methylcellulose (ISOPTO TEARS) 2.5 % ophthalmic solution Place 2 drops into the left eye daily as needed for dry eyes.    Historical Provider, MD  predniSONE (DELTASONE) 20 MG tablet Take 1 tablet (20 mg total) by mouth  2 (two) times daily. 09/15/14   Charles MelterElliott Brewer Dragon Thrush, MD   BP 104/83 mmHg  Pulse 103  Temp(Src) 99 F (37.2 C) (Oral)  Resp 18  SpO2 93% Physical Exam  Constitutional: He is oriented to person, place, and time. He appears well-developed and well-nourished. No distress.  HENT:  Head: Normocephalic and atraumatic.  Right Ear: External ear normal.  Left Ear: External ear normal.  Eyes: Conjunctivae and EOM are normal. Pupils are equal, round, and reactive to light.  Neck: Normal range of motion and phonation normal. Neck supple.  Cardiovascular: Normal rate, regular rhythm  and normal heart sounds.   Pulmonary/Chest: Effort normal. No respiratory distress. He exhibits no bony tenderness.  No increased work of breathing.  Mild decreased air movement bilaterally without audible wheezes, rales or rhonchi, on the initial examination prior to nebulizer treatment.  Abdominal: Soft. There is no tenderness.  Musculoskeletal: Normal range of motion.  Neurological: He is alert and oriented to person, place, and time. No cranial nerve deficit or sensory deficit. He exhibits normal muscle tone. Coordination normal.  Skin: Skin is warm, dry and intact.  Psychiatric: He has a normal mood and affect. His behavior is normal. Judgment and thought content normal.  Nursing note and vitals reviewed.   ED Course  Procedures (including critical care time)  Medications  ipratropium-albuterol (DUONEB) 0.5-2.5 (3) MG/3ML nebulizer solution 3 mL (3 mLs Nebulization Given 09/15/14 0937)  albuterol (PROVENTIL) (2.5 MG/3ML) 0.083% nebulizer solution 5 mg (2.5 mg Nebulization Given 09/15/14 0937)  predniSONE (DELTASONE) tablet 60 mg (60 mg Oral Given 09/15/14 0959)  sodium chloride 0.9 % bolus 1,000 mL (0 mLs Intravenous Stopped 09/15/14 1237)  ibuprofen (ADVIL,MOTRIN) tablet 600 mg (600 mg Oral Given 09/15/14 1115)    Patient Vitals for the past 24 hrs:  BP Temp Temp src Pulse Resp SpO2  09/15/14 1338 104/83 mmHg 99 F (37.2 C) Oral 103 18 93 %  09/15/14 1203 132/81 mmHg 99.8 F (37.7 C) Oral (!) 123 - 95 %  09/15/14 1100 129/77 mmHg - - (!) 126 - 91 %  09/15/14 1015 146/84 mmHg - - (!) 131 22 94 %  09/15/14 0908 (!) 174/138 mmHg - - (!) 140 26 92 %  09/15/14 0907 - 99.3 F (37.4 C) - - - -    1:53 PM Reevaluation with update and discussion. After initial assessment and treatment, an updated evaluation reveals he is more comfortable now.  Lungs have increased air movement without significant wheezing.  Findings discussed with patient and family member.  The patient understands  that he will need follow-up for an abnormal chest x-ray, by his primary care provider.  All questions were answered. [name]    Labs Review Labs Reviewed  BASIC METABOLIC PANEL - Abnormal; Notable for the following:    Sodium 133 (*)    CO2 18 (*)    Glucose, Bld 139 (*)    All other components within normal limits  CBC - Abnormal; Notable for the following:    MCH 34.2 (*)    Platelets 119 (*)    All other components within normal limits  I-STAT TROPOININ, ED    Imaging Review Dg Chest 2 View  09/15/2014   CLINICAL DATA:  Shortness of breath onset 1 day ago.  EXAM: CHEST  2 VIEW  COMPARISON:  09/15/2014.  FINDINGS: Trachea is midline. Heart size normal. Lungs are hyperinflated with linear scarring in the left lower lobe. A half rounded density projecting along the  right second anterior rib is again seen and new from 03/06/2014. No airspace consolidation or pleural fluid.  IMPRESSION: 1. Half rounded density projecting along the anterior right second rib margin, new from 03/06/2014. Additional evaluation with nonemergent CT chest without contrast is recommended in further evaluation, as clinically indicated. 2. Hyperinflation without acute finding.   Electronically Signed   By: Leanna Battles M.D.   On: 09/15/2014 12:58   Dg Chest Portable 1 View  09/15/2014   CLINICAL DATA:  Patient reports cough with fever for few days. The patient woke up last night gasping for air. Now short of breath. History of COPD and hypertension.  EXAM: PORTABLE CHEST - 1 VIEW  COMPARISON:  03/06/2014.  FINDINGS: Lungs are hyperinflated. There is mild patchy density at the left lung base reason the question of developing infiltrate or atelectasis. At the right lung apex there is question of a rounded mass. Recommend removing all EKG wires leads for a subsequent film. Consider erect PA chest if possible.  IMPRESSION: 1. Suspect left lower lobe atelectasis or early infiltrate. 2. Question of right upper lobe  mass warrants further evaluation. 3. Recommend follow-up erect PA chest in the department without any EKG wires or leads if possible.   Electronically Signed   By: Rosalie Gums M.D.   On: 09/15/2014 09:45     EKG Interpretation   Date/Time:  Monday September 15 2014 09:04:06 EST Ventricular Rate:  137 PR Interval:  160 QRS Duration: 74 QT Interval:  262 QTC Calculation: 395 R Axis:   100 Text Interpretation:  Sinus tachycardia Rightward axis Septal infarct ,  age undetermined Abnormal ECG since last tracing no significant change  Confirmed by Effie Shy  MD, Shamir Tuzzolino [phone]) on 09/15/2014 11:53:17 AM      MDM   Final diagnoses:  COPD exacerbation  Lung nodule    COPD exacerbation.  Improved with treatment.  Incidental lung abnormality, doubt acute worrisome findings.   Nursing Notes Reviewed/ Care Coordinated Applicable Imaging Reviewed Interpretation of Laboratory Data incorporated into ED treatment  The patient appears reasonably screened and/or stabilized for discharge and I doubt any other medical condition or other [facility] requiring further screening, evaluation, or treatment in the ED at this time prior to discharge.  Plan: Home Medications- Prednisone; Home Treatments- rest; return here if the recommended treatment, does not improve the symptoms; Recommended follow up- PCP in 1 week for check up and to arrange Chest CT.     Charles Melter, MD 09/15/14 [phone]

## 2014-09-15 NOTE — ED Notes (Signed)
Xray at the bedside.

## 2014-09-15 NOTE — ED Notes (Signed)
Provider at the bedside.  

## 2014-09-16 ENCOUNTER — Other Ambulatory Visit: Payer: Self-pay | Admitting: Nurse Practitioner

## 2014-09-16 DIAGNOSIS — R918 Other nonspecific abnormal finding of lung field: Secondary | ICD-10-CM

## 2014-09-17 ENCOUNTER — Ambulatory Visit
Admission: RE | Admit: 2014-09-17 | Discharge: 2014-09-17 | Disposition: A | Discharge status: Home / Self Care | Payer: BC Managed Care – PPO | Source: Ambulatory Visit | Attending: Nurse Practitioner | Admitting: Nurse Practitioner

## 2014-09-17 DIAGNOSIS — R918 Other nonspecific abnormal finding of lung field: Secondary | ICD-10-CM

## 2014-09-17 MED ORDER — IOHEXOL 300 MG/ML  SOLN
75.0000 mL | Freq: Once | INTRAMUSCULAR | Status: AC | PRN
Start: 2014-09-17 — End: 2014-09-17
  Administered 2014-09-17: 75 mL via INTRAVENOUS

## 2014-10-01 ENCOUNTER — Ambulatory Visit (INDEPENDENT_AMBULATORY_CARE_PROVIDER_SITE_OTHER): Payer: BLUE CROSS/BLUE SHIELD | Admitting: Internal Medicine

## 2014-10-01 ENCOUNTER — Encounter: Payer: Self-pay | Admitting: Internal Medicine

## 2014-10-01 VITALS — BP 124/76 | HR 91 | Ht 74.0 in | Wt 209.8 lb

## 2014-10-01 DIAGNOSIS — J449 Chronic obstructive pulmonary disease, unspecified: Secondary | ICD-10-CM

## 2014-10-01 DIAGNOSIS — Z23 Encounter for immunization: Secondary | ICD-10-CM

## 2014-10-01 DIAGNOSIS — R9389 Abnormal findings on diagnostic imaging of other specified body structures: Secondary | ICD-10-CM | POA: Insufficient documentation

## 2014-10-01 DIAGNOSIS — R938 Abnormal findings on diagnostic imaging of other specified body structures: Secondary | ICD-10-CM

## 2014-10-01 DIAGNOSIS — J441 Chronic obstructive pulmonary disease with (acute) exacerbation: Secondary | ICD-10-CM | POA: Insufficient documentation

## 2014-10-01 NOTE — Patient Instructions (Signed)
Pneumovax 23  Flu vax  Order - schedule PFT   Dx COPD mixed type

## 2014-10-01 NOTE — Assessment & Plan Note (Signed)
No parenchymal nodule. The specific right upper lobe shadow is insignificant. His exposure history puts him at long-term increased risk for bronchogenic carcinoma. Plan-surveillance

## 2014-10-01 NOTE — Assessment & Plan Note (Signed)
We discussed his dyspnea on exertion, chronic cough and exposure history. Plan-pneumococcal and flu vaccines were discussed in detail and administered. Formal PFT scheduled. Current bronchodilator medicines are appropriate pending results of PFT.

## 2014-10-01 NOTE — Progress Notes (Signed)
10/01/14- [age] yoM referred courtesy of Dr Tomi BambergerSusan Fuller; COPD, SOB, CT chest 09-17-14 showed lung mass.  Stopped smoking 2-1/2 years ago. 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 No history of pneumonia. He has been more aware of dyspnea on exertion for the last 2 years and was told 2 years ago that he had COPD. About 2 weeks ago he had an acute bronchitis syndrome "flu" slowly resolving. Chest x-ray showed question of a right upper lobe nodule. CT chest showed central lobular emphysema, bronchiectasis, and identified the right upper lobe shadow as a prior fracture of the anterior right second rib with bony overgrowth. He says he has had several rib fractures.  Daily cough-dry or with some clear phlegm. No blood. Occasional wheeze. He denies weight loss, exertional chest pain, palpitation, swollen glands, edema or fever.  CT chest 09/17/14-images reviewed with patient IMPRESSION: Underlying emphysema with mild bronchiectatic change. No parenchymal lung mass. The area questioned on the recent chest radiograph is felt to be due to a prior fracture of the anterior second rib on the right. No edema or consolidation. No adenopathy. Hepatic steatosis. Upper thoracic compression fractures are noted, age uncertain. Electronically Signed  By: Bretta BangWilliam Woodruff M.D.  On: 09/17/2014 10:17  Prior to Admission medications   Medication Sig Start Date End Date Taking? Authorizing Provider  Aclidinium Bromide (TUDORZA PRESSAIR IN) Inhale 1 puff into the lungs daily.   Yes Historical Provider, MD  Aflibercept 2 MG/0.05ML SOLN Inject into the eye. Injection every 5 weeks   Yes Historical Provider, MD  albuterol (PROVENTIL HFA;VENTOLIN HFA) 108 (90 BASE) MCG/ACT inhaler Inhale 2 puffs into the lungs every 6 (six) hours as needed for wheezing or shortness of breath.   Yes Historical  Provider, MD  diclofenac (VOLTAREN) 75 MG EC tablet Take 1 tablet (75 mg total) by mouth 2 (two) times daily. In the morning and in the afternoon Patient taking differently: Take 75 mg by mouth daily. In the morning and in the afternoon 08/19/14  Yes Kirstie PeriNorman S Regal, DPM  ibuprofen (ADVIL,MOTRIN) 200 MG tablet Take 800 mg by mouth every 6 (six) hours as needed for moderate pain.    Yes Historical Provider, MD  lisinopril (PRINIVIL,ZESTRIL) 20 MG tablet Take 20 mg by mouth every morning.   Yes Historical Provider, MD  Multiple Vitamins-Minerals (MEGA MULTI MEN PO) Take 1 tablet by mouth daily.   Yes Historical Provider, MD  pantoprazole (PROTONIX) 40 MG tablet Take 40 mg by mouth every morning.   Yes Historical Provider, MD   Past Medical History  Diagnosis Date  . Hypertension   . GERD (gastroesophageal reflux disease)   . COPD (chronic obstructive pulmonary disease)    Past Surgical History  Procedure Laterality Date  . Orthopedic surgery Bilateral     pt states fracture repairs to arms and legs.  . Eye surgery Bilateral   . Cataract extraction, bilateral     Family History  Problem Relation Age of Onset  . Heart disease Mother   . Hypertension Father    History   Social History  . Marital Status: Single    Spouse Name: N/A    Number of Children: 2  . Years of Education: N/A   Occupational History  . Holiday representativeconstruction    Social History Main Topics  . Smoking status: Former Smoker -- 1.00 packs/day for 35 years    Types: Cigarettes  Quit date: 10/01/2012  . Smokeless tobacco: Not on file  . Alcohol Use: 3.0 oz/week    5 Cans of beer per week     Comment: daily  . Drug Use: No  . Sexual Activity: Not on file   Other Topics Concern  . Not on file   Social History Narrative   ROS-see HPI Constitutional:   No-   weight loss, night sweats, fevers, chills, fatigue, lassitude. HEENT:   No-  headaches, difficulty swallowing, tooth/dental problems, sore throat,       No-   sneezing, itching, ear ache, nasal congestion, post nasal drip,  CV:  No-   chest pain, orthopnea, PND, swelling in lower extremities, anasarca,                                  dizziness, palpitations Resp: +  shortness of breath with exertion or at rest.             +   productive cough,  + non-productive cough,  No- coughing up of blood.              No-   change in color of mucus.  No- wheezing.  +snores Skin: No-   rash or lesions. GI:  No-   heartburn, indigestion, +abdominal pain, nausea, vomiting, diarrhea,                 change in bowel habits, loss of appetite GU: No-   dysuria, change in color of urine, no urgency or frequency.  No- flank pain. MS:  No-   joint pain or swelling.  No- decreased range of motion.  No- back pain. Neuro-     nothing unusual Psych:  No- change in mood or affect. No depression or anxiety.  No memory loss.  OBJ- Physical Exam General- Alert, Oriented, Affect-appropriate, Distress- none acute Skin- +facial rash c/w rosacea Lymphadenopathy- none Head- atraumatic            Eyes- Gross vision intact, PERRLA, conjunctivae and secretions clear            Ears- Hearing, canals-normal            Nose- Clear, no-Septal dev, mucus, polyps, erosion, perforation             Throat- Mallampati III-IV , mucosa clear , drainage- none, tonsils- atrophic Neck- flexible , trachea midline, no stridor , thyroid nl, carotid no bruit Chest - symmetrical excursion , unlabored           Heart/CV- RRR , no murmur , no gallop  , no rub, nl s1 s2                           - JVD- none , edema- none, stasis changes- none, varices- none           Lung- +clear but distant, wheeze- none, cough- none , dullness-none, rub- none           Chest wall-  Abd- tender-no, distended-no, bowel sounds-present, HSM- no Br/ Gen/ Rectal- Not done, not indicated Extrem- cyanosis- none, clubbing, none, atrophy- none, strength- nl,             +Nails bitten short Neuro- grossly intact to  observation

## 2014-12-09 ENCOUNTER — Ambulatory Visit: Payer: BLUE CROSS/BLUE SHIELD | Admitting: Podiatry

## 2014-12-15 ENCOUNTER — Ambulatory Visit (INDEPENDENT_AMBULATORY_CARE_PROVIDER_SITE_OTHER): Payer: BLUE CROSS/BLUE SHIELD | Admitting: Podiatry

## 2014-12-15 VITALS — BP 138/99 | HR 83 | Resp 16

## 2014-12-15 DIAGNOSIS — M722 Plantar fascial fibromatosis: Secondary | ICD-10-CM

## 2014-12-15 DIAGNOSIS — M779 Enthesopathy, unspecified: Secondary | ICD-10-CM

## 2014-12-15 MED ORDER — DICLOFENAC SODIUM 75 MG PO TBEC: 75.0000 mg | DELAYED_RELEASE_TABLET | Freq: Two times a day (BID) | ORAL | Status: DC

## 2014-12-15 MED ORDER — TRIAMCINOLONE ACETONIDE 10 MG/ML IJ SUSP
10.0000 mg | Freq: Once | INTRAMUSCULAR | Status: AC
  Administered 2014-12-15: 10 mg

## 2014-12-15 NOTE — Progress Notes (Signed)
Subjective:     Patient ID: [name], male   DOB: [birth year], 59 y.o.   MRN: [id number]  HPI patient states my pain is most pronounced on the top of my feet in my heels are also still bothering me. States it's just been going on for the last approximate 4 weeks   Review of Systems     Objective:   Physical Exam Neurovascular status intact with muscle strength adequate range of motion within normal limits and noted to have dorsal pain around the midtarsal joint bilateral and moderate plantar heel pain at the medial insertion    Assessment:     Continued plantar fasciitis and midtarsal joint inflammation with probable tendinitis dorsal left and right    Plan:     Reviewed condition and at this time did dorsal injections 3 mg Kenalog 5 mg Xylocaine and dispensed fascial brace for each foot to lift up the arch which was tolerated well. Reappoint to recheck again in the next 3 weeks

## 2015-01-05 ENCOUNTER — Ambulatory Visit: Payer: BLUE CROSS/BLUE SHIELD | Admitting: Podiatry

## 2015-01-14 ENCOUNTER — Encounter: Payer: Self-pay | Admitting: Podiatry

## 2015-01-14 ENCOUNTER — Ambulatory Visit (INDEPENDENT_AMBULATORY_CARE_PROVIDER_SITE_OTHER): Payer: BLUE CROSS/BLUE SHIELD | Admitting: Podiatry

## 2015-01-14 VITALS — BP 137/91 | HR 95 | Resp 15

## 2015-01-14 DIAGNOSIS — M79673 Pain in unspecified foot: Secondary | ICD-10-CM

## 2015-01-14 DIAGNOSIS — M779 Enthesopathy, unspecified: Secondary | ICD-10-CM

## 2015-01-14 MED ORDER — TRIAMCINOLONE ACETONIDE 10 MG/ML IJ SUSP
10.0000 mg | Freq: Once | INTRAMUSCULAR | Status: AC
  Administered 2015-01-14: 10 mg

## 2015-01-15 NOTE — Progress Notes (Signed)
Subjective:     Patient ID: [name], male   DOB: April 03, 1956, 59 y.o.   MRN: [id number]  HPI patient presents stating I'm doing a lot better with most my foot but the pain has moved more around the inside of the left ankle   Review of Systems     Objective:   Physical Exam Neurovascular status intact muscle strength adequate with significant reduction of primary inflammation. There is discomfort around the anterior tibial tendon as it comes across the ankle with mild edema noted but no indication of tendon dysfunction    Assessment:     Improved inflammatory condition with mild anterior tibial tendinitis left    Plan:     Careful steroid injection administered around the sheath of the anterior tibial tendon with explaining chances for rupture to patient prior to initiation of procedure. I then went ahead and advised on reduced activity heat and ice and supportive shoe gear. Reappoint if symptoms persist

## 2015-01-20 ENCOUNTER — Encounter: Payer: Self-pay | Admitting: Podiatry

## 2015-01-20 ENCOUNTER — Ambulatory Visit (INDEPENDENT_AMBULATORY_CARE_PROVIDER_SITE_OTHER): Payer: BLUE CROSS/BLUE SHIELD | Admitting: Podiatry

## 2015-01-20 VITALS — BP 124/84 | HR 90 | Resp 14

## 2015-01-20 DIAGNOSIS — M779 Enthesopathy, unspecified: Secondary | ICD-10-CM | POA: Diagnosis not present

## 2015-01-20 NOTE — Progress Notes (Signed)
Subjective:     Patient ID: [name], male   DOB: Nov 28, 1955, 59 y.o.   MRN: [id number]  HPI patient states I'm still having pain in this left ankle and not sure where it's coming from   Review of Systems     Objective:   Physical Exam Neurovascular status intact with no health history changes and continued discomfort in the dorsal medial aspect of the left ankle with no tendon dysfunction or muscular insufficiency at this time    Assessment:     Appears to be some kind of a inflammatory tendinitis or other condition left ankle    Plan:     Reviewed condition and at this point recommended reviewed usage of which patient has one at home. Aggressive heat and ice and if improvement not forthcoming in 3-4 weeks we may need to consider MRI or CT scan

## 2015-01-28 ENCOUNTER — Ambulatory Visit: Payer: BLUE CROSS/BLUE SHIELD | Admitting: Podiatry

## 2015-06-24 ENCOUNTER — Telehealth: Payer: Self-pay | Admitting: Internal Medicine

## 2015-06-24 DIAGNOSIS — R042 Hemoptysis: Secondary | ICD-10-CM

## 2015-06-24 MED ORDER — AZITHROMYCIN 250 MG PO TABS: ORAL_TABLET | ORAL | Status: AC

## 2015-06-24 NOTE — Telephone Encounter (Signed)
LVM for pt to return call

## 2015-06-24 NOTE — Telephone Encounter (Signed)
Pt's girlfriend Rosey Bath called back Pt has been having gray mucus mixed with dark red blood worse x2 weeks, no fever, increased SOB, some wheezing, no tightness. Rosey Bath stated pt "doesn't feel bad, it's just his cough" No otc meds taken so far Rosey Bath states pt cannot take mucinex - "messes up his breathing"  Will forward to CY for recs Pt last seen 09/2014 and rec'd to follow up in 1 year  Dr Maple Hudson please advise, thank you.

## 2015-06-24 NOTE — Telephone Encounter (Signed)
Offer Zpak and suggest Mucinex DM  Recommend outpatient CXR   Dx hemoptysis NOS

## 2015-06-24 NOTE — Telephone Encounter (Signed)
Spoke with pt's girlfriend. She is aware of CY's recommendation. Rx and order have been placed. Order has been faxed to [facility] ([address]) per their request.

## 2015-06-25 ENCOUNTER — Telehealth: Payer: Self-pay | Admitting: Internal Medicine

## 2015-06-25 NOTE — Telephone Encounter (Signed)
Spoke with pt's girlfriend, states that [facility] states they cannot get pt the cxr until a physical order for the cxr is in hand.  I advised that this order was faxed to [facility] yesterday. ATC [facility] X2, continued to be routed back to main menu and placed on hold for greater than 6 minutes each time.   Called pt back, states that they wish for the cxr order to be reprinted and mailed to verified address on file.   Order printed, signed, and placed in outgoing mail.  Nothing further needed.

## 2015-07-20 ENCOUNTER — Ambulatory Visit
Admission: RE | Admit: 2015-07-20 | Discharge: 2015-07-20 | Disposition: A | Discharge status: Home / Self Care | Payer: BLUE CROSS/BLUE SHIELD | Source: Ambulatory Visit | Attending: Internal Medicine | Admitting: Internal Medicine

## 2015-07-20 DIAGNOSIS — R042 Hemoptysis: Secondary | ICD-10-CM

## 2015-07-27 ENCOUNTER — Ambulatory Visit (INDEPENDENT_AMBULATORY_CARE_PROVIDER_SITE_OTHER)
Admission: RE | Admit: 2015-07-27 | Discharge: 2015-07-27 | Disposition: A | Discharge status: Home / Self Care | Payer: BLUE CROSS/BLUE SHIELD | Source: Ambulatory Visit | Attending: Internal Medicine | Admitting: Internal Medicine

## 2015-07-27 ENCOUNTER — Ambulatory Visit (INDEPENDENT_AMBULATORY_CARE_PROVIDER_SITE_OTHER): Payer: BLUE CROSS/BLUE SHIELD | Admitting: Internal Medicine

## 2015-07-27 ENCOUNTER — Other Ambulatory Visit (INDEPENDENT_AMBULATORY_CARE_PROVIDER_SITE_OTHER): Payer: BLUE CROSS/BLUE SHIELD

## 2015-07-27 ENCOUNTER — Encounter: Payer: Self-pay | Admitting: Internal Medicine

## 2015-07-27 VITALS — BP 122/84 | HR 86 | Ht 74.0 in | Wt 218.2 lb

## 2015-07-27 DIAGNOSIS — R9389 Abnormal findings on diagnostic imaging of other specified body structures: Secondary | ICD-10-CM

## 2015-07-27 DIAGNOSIS — Z23 Encounter for immunization: Secondary | ICD-10-CM | POA: Diagnosis not present

## 2015-07-27 DIAGNOSIS — J432 Centrilobular emphysema: Secondary | ICD-10-CM | POA: Diagnosis not present

## 2015-07-27 DIAGNOSIS — R918 Other nonspecific abnormal finding of lung field: Secondary | ICD-10-CM

## 2015-07-27 DIAGNOSIS — J449 Chronic obstructive pulmonary disease, unspecified: Secondary | ICD-10-CM

## 2015-07-27 DIAGNOSIS — R938 Abnormal findings on diagnostic imaging of other specified body structures: Secondary | ICD-10-CM | POA: Diagnosis not present

## 2015-07-27 LAB — CBC WITH DIFFERENTIAL/PLATELET
BASOS ABS: 0.1 10*3/uL (ref 0.0–0.1)
Basophils Relative: 0.7 % (ref 0.0–3.0)
Eosinophils Absolute: 0.2 10*3/uL (ref 0.0–0.7)
Eosinophils Relative: 2.5 % (ref 0.0–5.0)
HCT: 43.1 % (ref 39.0–52.0)
Hemoglobin: 14.5 g/dL (ref 13.0–17.0)
LYMPHS ABS: 1.2 10*3/uL (ref 0.7–4.0)
LYMPHS PCT: 16.1 % (ref 12.0–46.0)
MCHC: 33.7 g/dL (ref 30.0–36.0)
MCV: 97.9 fl (ref 78.0–100.0)
MONO ABS: 0.8 10*3/uL (ref 0.1–1.0)
Monocytes Relative: 10.9 % (ref 3.0–12.0)
NEUTROS PCT: 69.8 % (ref 43.0–77.0)
Neutro Abs: 5.4 10*3/uL (ref 1.4–7.7)
PLATELETS: 298 10*3/uL (ref 150.0–400.0)
RBC: 4.4 Mil/uL (ref 4.22–5.81)
RDW: 12.7 % (ref 11.5–15.5)
WBC: 7.8 10*3/uL (ref 4.0–10.5)

## 2015-07-27 NOTE — Patient Instructions (Addendum)
Order- schedule CT chest , no contrast     Dx lung nodules, hx asbestos exposure  Flu vax  Order- lab- CBC w diff, quantiferon Gold TB assay, a-1-AT assay   Order- lab- office spirometry  Dx centrilobular emphysema

## 2015-07-27 NOTE — Progress Notes (Signed)
10/01/14- 59 yoM referred courtesy of Dr Tomi Bamberger; COPD, SOB, CT chest 09-17-14 showed lung mass.  Stopped smoking 2-1/2 years ago. 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 No history of pneumonia. He has been more aware of dyspnea on exertion for the last 2 years and was told 2 years ago that he had COPD. About 2 weeks ago he had an acute bronchitis syndrome "flu" slowly resolving. Chest x-ray showed question of a right upper lobe nodule. CT chest showed central lobular emphysema, bronchiectasis, and identified the right upper lobe shadow as a prior fracture of the anterior right second rib with bony overgrowth. He says he has had several rib fractures.  Daily cough-dry or with some clear phlegm. No blood. Occasional wheeze. He denies weight loss, exertional chest pain, palpitation, swollen glands, edema or fever.  CT chest 09/17/14-images reviewed with patient IMPRESSION: Underlying emphysema with mild bronchiectatic change. No parenchymal lung mass. The area questioned on the recent chest radiograph is felt to be due to a prior fracture of the anterior second rib on the right. No edema or consolidation. No adenopathy. Hepatic steatosis. Upper thoracic compression fractures are noted, 59 age uncertain. Electronically Signed  By: Bretta Bang M.D.  On: 09/17/2014 10:17   07/27/15- 59 year old male former smoker followed for COPD, history of asbestos exposure Follows For: pt returns with xray today. pt states he feels fine, with some effects of the COPD thats expected. pt c/o SOB with exertion and some wheezing on awakening. no c/o chest tightness or cough.  We reviewed recent chest x-ray images showing soft fluffy nodularity upper left lung. Girlfriend tells him he coughs especially in his sleep and as he first wakes up. Clear mucus. Denies night sweats, fever,  purulent or bloody sputum, adenopathy, chest pain or recent infection. No history of TB exposure. Recent Z-Pak. CXR 07/20/15 IMPRESSION: Multiple new pulmonary nodular densities scattered throughout the left lung with associated left lower lobe infiltrate. Infectious etiology is suspected. Malignant etiology cannot be excluded. Follow-up chest x-rays recommended to demonstrate clearing. If these findings do not clear IV contrast-enhanced chest CT should be considered for further evaluation. Electronically Signed  By: Maisie Fus Register  On: 07/20/2015 10:27 CXR 07/27/15 IMPRESSION: 1. Nodules are again noted in the left mid and upper lung field, not present on the prior CT of the chest from December 2015. Recommend CT of the chest to assess further. 2. Emphysema. 3. Stable upper thoracic compression deformities. Electronically Signed  By: Dwyane Dee M.D.  On: 07/27/2015 11:15 Office Spirometry 07/27/2015-not valid. Despite repetitive coaching he did not generate acceptable curves.  ROS-see HPI Constitutional:   No-   weight loss, night sweats, fevers, chills, fatigue, lassitude. HEENT:   No-  headaches, difficulty swallowing, tooth/dental problems, sore throat,       No-  sneezing, itching, ear ache, nasal congestion, post nasal drip,  CV:  No-   chest pain, orthopnea, PND, swelling in lower extremities, anasarca,                                                    dizziness, palpitations Resp: +  shortness of breath with exertion or at rest.             +  productive cough,  + non-productive cough,  No- coughing up of blood.              No-   change in color of mucus.  No- wheezing.  +snores Skin: No-   rash or lesions. GI:  No-   heartburn, indigestion, abdominal pain, nausea, vomiting,  GU:  MS:  No-   joint pain or swelling.  . Neuro-     nothing unusual Psych:  No- change in mood or affect. No depression or anxiety.  No memory loss.  OBJ- Physical Exam General- Alert,  Oriented, Affect-appropriate, Distress- none acute Skin- +facial rash c/w rosacea Lymphadenopathy- none Head- atraumatic            Eyes- Gross vision intact, PERRLA, conjunctivae and secretions clear            Ears- Hearing, canals-normal            Nose- Clear, no-Septal dev, mucus, polyps, erosion, perforation             Throat- Mallampati III-IV , mucosa clear , drainage- none, tonsils- atrophic Neck- flexible , trachea midline, no stridor , thyroid nl, carotid no bruit Chest - symmetrical excursion , unlabored           Heart/CV- RRR , no murmur , no gallop  , no rub, nl s1 s2                           - JVD- none , edema- none, stasis changes- none, varices- none           Lung- +clear but distant, wheeze- none, cough- none , dullness-none, rub- none           Chest wall-  Abd- tender-no, distended-no, bowel sounds-present, HSM- no Br/ Gen/ Rectal- Not done, not indicated Extrem- cyanosis- none, clubbing, none, atrophy- none, strength- nl,             +Nails bitten short Neuro- grossly intact to observation

## 2015-07-28 NOTE — Assessment & Plan Note (Signed)
Previously a chest wall nodule was identified but the current left upper lung zone nodularity seen on chest x-ray is different and looks like an inflammatory process. Plan-flu vaccine, CBC with differential, QuantiFERON-TB assay, CT chest

## 2015-07-28 NOTE — Assessment & Plan Note (Signed)
Symptoms consistent with some chronic bronchitis. Consider trying formal PFT in the future. Current effort on office spirometry was clearly nonphysiologic despite a lot of coaching.

## 2015-07-29 ENCOUNTER — Ambulatory Visit (INDEPENDENT_AMBULATORY_CARE_PROVIDER_SITE_OTHER)
Admission: RE | Admit: 2015-07-29 | Discharge: 2015-07-29 | Disposition: A | Discharge status: Home / Self Care | Payer: BLUE CROSS/BLUE SHIELD | Source: Ambulatory Visit | Attending: Internal Medicine | Admitting: Internal Medicine

## 2015-07-29 DIAGNOSIS — R918 Other nonspecific abnormal finding of lung field: Secondary | ICD-10-CM | POA: Diagnosis not present

## 2015-07-29 LAB — QUANTIFERON TB GOLD ASSAY (BLOOD)
INTERFERON GAMMA RELEASE ASSAY: NEGATIVE
Mitogen value: >10 IU/mL
QUANTIFERON NIL VALUE: 0.02 [IU]/mL
QUANTIFERON TB AG MINUS NIL: 0 [IU]/mL
TB AG VALUE: 0.02 [IU]/mL

## 2015-07-30 ENCOUNTER — Telehealth: Payer: Self-pay | Admitting: Internal Medicine

## 2015-07-30 DIAGNOSIS — R911 Solitary pulmonary nodule: Secondary | ICD-10-CM

## 2015-07-30 LAB — GALACTOSE-ALPHA-1,3-GALACTOSE IGE: Galactose-alpha-1,3-galactose IgE: 9.75 kU/L — ABNORMAL HIGH (ref <0.35)

## 2015-07-30 MED ORDER — CLARITHROMYCIN 500 MG PO TABS: 500.0000 mg | ORAL_TABLET | Freq: Two times a day (BID) | ORAL | Status: DC

## 2015-07-30 NOTE — Telephone Encounter (Signed)
Notes Recorded by Waymon Budgelinton D Young, MD on 07/29/2015 at 7:31 PM CT chest- confirms multiple nodules in left lung, new this year. Suspect this is infection. Recommend: order- Rx Biaxin 500 mg, # 14, 1 twice daily with food             Order f/u CXR in 3 weeks  For dx lung nodules --  Called spoke with pt and is aware of results. [facility]. Pt is aware he will need repeat CXR done in 3 weeks (order placed). He will do so. Nothing further needed

## 2015-08-26 ENCOUNTER — Ambulatory Visit (INDEPENDENT_AMBULATORY_CARE_PROVIDER_SITE_OTHER)
Admission: RE | Admit: 2015-08-26 | Discharge: 2015-08-26 | Disposition: A | Discharge status: Home / Self Care | Payer: BLUE CROSS/BLUE SHIELD | Source: Ambulatory Visit | Attending: Internal Medicine | Admitting: Internal Medicine

## 2015-08-26 ENCOUNTER — Other Ambulatory Visit: Payer: Self-pay | Admitting: Internal Medicine

## 2015-08-26 DIAGNOSIS — R911 Solitary pulmonary nodule: Secondary | ICD-10-CM | POA: Diagnosis not present

## 2015-10-05 ENCOUNTER — Ambulatory Visit (INDEPENDENT_AMBULATORY_CARE_PROVIDER_SITE_OTHER)
Admission: RE | Admit: 2015-10-05 | Discharge: 2015-10-05 | Disposition: A | Discharge status: Home / Self Care | Payer: BLUE CROSS/BLUE SHIELD | Source: Ambulatory Visit | Attending: Internal Medicine | Admitting: Internal Medicine

## 2015-10-05 DIAGNOSIS — R911 Solitary pulmonary nodule: Secondary | ICD-10-CM | POA: Diagnosis not present

## 2015-10-26 ENCOUNTER — Telehealth: Payer: Self-pay | Admitting: Internal Medicine

## 2015-10-26 NOTE — Telephone Encounter (Signed)
Pt can be seen on Wednesday 10/28/15 at 3:15pm. Thanks.

## 2015-10-26 NOTE — Telephone Encounter (Signed)
Spoke with patient's girlfriend Aggie Cosier, states that the patient needs a sooner OV than 11/25/15 with CY. Pt has been more SOB and has been placed on O2 at night -- 2Liters cont. Pt was seen by Eye Doctor and was advised that they believed he had low O2 and that his eyes were not getting adequate amount of O2. The Ophthalmologist ordered an ONO which qualified the patient for nocturnal O2 (per the patient's girlfriend). Requesting sooner OV to evaluate for any further issues. Requesting a cxr in case there is something else going on. Please advise Dr Maple Hudson. Thanks.

## 2015-10-26 NOTE — Telephone Encounter (Signed)
Called spoke with Aggie Cosier. appt scheduled to see Dr. Maple Hudson on 2/8 at 3:15. Nothing further needed

## 2015-10-27 ENCOUNTER — Telehealth: Payer: Self-pay | Admitting: *Deleted

## 2015-10-27 MED ORDER — DICLOFENAC SODIUM 75 MG PO TBEC: 75.0000 mg | DELAYED_RELEASE_TABLET | Freq: Two times a day (BID) | ORAL | Status: DC

## 2015-10-27 NOTE — Telephone Encounter (Signed)
Refill request for Voltaren  #60 received.  Dr. Charlsie Merles refilled for #30 to get pt to an appt date.

## 2015-10-28 ENCOUNTER — Encounter: Payer: Self-pay | Admitting: Internal Medicine

## 2015-10-28 ENCOUNTER — Ambulatory Visit (INDEPENDENT_AMBULATORY_CARE_PROVIDER_SITE_OTHER): Payer: BLUE CROSS/BLUE SHIELD | Admitting: Internal Medicine

## 2015-10-28 VITALS — BP 132/80 | HR 91 | Ht 74.0 in | Wt 218.4 lb

## 2015-10-28 DIAGNOSIS — J42 Unspecified chronic bronchitis: Secondary | ICD-10-CM

## 2015-10-28 DIAGNOSIS — R938 Abnormal findings on diagnostic imaging of other specified body structures: Secondary | ICD-10-CM | POA: Diagnosis not present

## 2015-10-28 DIAGNOSIS — G4733 Obstructive sleep apnea (adult) (pediatric): Secondary | ICD-10-CM

## 2015-10-28 DIAGNOSIS — R918 Other nonspecific abnormal finding of lung field: Secondary | ICD-10-CM

## 2015-10-28 DIAGNOSIS — R9389 Abnormal findings on diagnostic imaging of other specified body structures: Secondary | ICD-10-CM

## 2015-10-28 DIAGNOSIS — J449 Chronic obstructive pulmonary disease, unspecified: Secondary | ICD-10-CM

## 2015-10-28 MED ORDER — BENZONATATE 200 MG PO CAPS: 200.0000 mg | ORAL_CAPSULE | Freq: Three times a day (TID) | ORAL | Status: DC | PRN

## 2015-10-28 MED ORDER — FLUTICASONE FUROATE-VILANTEROL 200-25 MCG/INH IN AEPB: 1.0000 | INHALATION_SPRAY | Freq: Every day | RESPIRATORY_TRACT | Status: DC

## 2015-10-28 NOTE — Patient Instructions (Addendum)
Order- unattended home sleep test    Dx OSA   Don't wear your oxygen on this study night  Order- schedule PFT     Dx chronic bronchitis  Sample Breo Ellipta 200    Inhale 1 puff, once daily, then rinse mouth  Script sent for benzonatate perles to use for cough if needed

## 2015-10-28 NOTE — Assessment & Plan Note (Signed)
Cough consistent with recent exacerbation. Plan-schedule PFT, try sample Breo Ellipta, benzonatate Perles

## 2015-10-28 NOTE — Assessment & Plan Note (Signed)
Getting smaller, consistent with inflammatory etiology. He will be watched long-term with at least occasional chest x-ray and possibly CT

## 2015-10-28 NOTE — Assessment & Plan Note (Signed)
Nodules look smaller on latest imaging consistent with inflammatory process. We will continue to watch.

## 2015-10-28 NOTE — Progress Notes (Signed)
10/01/14- 89 yoM referred courtesy of Dr Tomi Bamberger; COPD, SOB, CT chest 09-17-14 showed lung mass.  Stopped smoking 2-1/2 years ago. 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 No history of pneumonia. He has been more aware of dyspnea on exertion for the last 2 years and was told 2 years ago that he had COPD. About 2 weeks ago he had an acute bronchitis syndrome "flu" slowly resolving. Chest x-ray showed question of a right upper lobe nodule. CT chest showed central lobular emphysema, bronchiectasis, and identified the right upper lobe shadow as a prior fracture of the anterior right second rib with bony overgrowth. He says he has had several rib fractures.  Daily cough-dry or with some clear phlegm. No blood. Occasional wheeze. He denies weight loss, exertional chest pain, palpitation, swollen glands, edema or fever.  CT chest 09/17/14-images reviewed with patient IMPRESSION: Underlying emphysema with mild bronchiectatic change. No parenchymal lung mass. The area questioned on the recent chest radiograph is felt to be due to a prior fracture of the anterior second rib on the right. No edema or consolidation. No adenopathy. Hepatic steatosis. Upper thoracic compression fractures are noted, age uncertain. Electronically Signed  By: Bretta Bang M.D.  On: 09/17/2014 10:17   07/27/15- 60 year old male former smoker followed for COPD, history of asbestos exposure Follows For: pt returns with xray today. pt states he feels fine, with some effects of the COPD thats expected. pt c/o SOB with exertion and some wheezing on awakening. no c/o chest tightness or cough.  We reviewed recent chest x-ray images showing soft fluffy nodularity upper left lung. Girlfriend tells him he coughs especially in his sleep and as he first wakes up. Clear mucus. Denies night sweats, fever,  purulent or bloody sputum, adenopathy, chest pain or recent infection. No history of TB exposure. Recent Z-Pak. CXR 07/20/15 IMPRESSION: Multiple new pulmonary nodular densities scattered throughout the left lung with associated left lower lobe infiltrate. Infectious etiology is suspected. Malignant etiology cannot be excluded. Follow-up chest x-rays recommended to demonstrate clearing. If these findings do not clear IV contrast-enhanced chest CT should be considered for further evaluation. Electronically Signed  By: Maisie Fus Register  On: 07/20/2015 10:27 CXR 07/27/15 IMPRESSION: 1. Nodules are again noted in the left mid and upper lung field, not present on the prior CT of the chest from December 2015. Recommend CT of the chest to assess further. 2. Emphysema. 3. Stable upper thoracic compression deformities. Electronically Signed  By: Dwyane Dee M.D.  On: 07/27/2015 11:15 Office Spirometry 07/27/2015-not valid. Despite repetitive coaching he did not generate acceptable curves.  10/28/2015-60 year old male former smoker followed for COPD, history asbestos exposure/lung nodules O2 2 L sleep/[facility] ACUTE VISIT: Increased SOB; cough-productive-white to yellow. Pt states had attack recently from cough(was around heat) and could not breath-almost called 911 but was able to get to cold air and take breath. Eye (Dr Luciana Axe near [place]() put patient on O2-had ONO done as well. Describes episodes of obvious laryngospasm when he walked into a hot store, relieved by cool air and relaxation. We discussed oxygen during sleep. His girlfriend tells him he snores loudly suggesting possible sleep apnea. He has mostly stopped eating meat after our testing previously and is doing better. Developed a cough 2 weeks ago. We discussed lisinopril but he says he does not usually have a cough. They have had a chest infection at the time. Still notices  shortness of breath especially with exertion. Discussed  chest CT showing some improvement in nodules consistent with inflammation. No night sweats. CT chest 07/29/2015 IMPRESSION: Scattered peribronchovascular nodular consolidation throughout the left lung, most likely infectious in etiology, possibly atypical/fungal. Followup PA and lateral chest X-ray is recommended in 3-4 weeks following trial of antibiotic therapy to ensure resolution and exclude underlying malignancy. Electronically Signed  By: Leanna Battles M.D.  On: 07/29/2015 11:49 CXR 10/05/2015 IMPRESSION: No acute cardiopulmonary disease. Nodular opacities previously identified have decreased in size suggesting an infectious etiology. Continued follow-up chest x-rays to demonstrate complete resolution suggested. Electronically Signed  By: Maisie Fus Register  On: 10/05/2015 12:55  ROS-see HPI Constitutional:   No-   weight loss, night sweats, fevers, chills, fatigue, lassitude. HEENT:   No-  headaches, difficulty swallowing, tooth/dental problems, sore throat,       No-  sneezing, itching, ear ache, nasal congestion, post nasal drip,  CV:  No-   chest pain, orthopnea, PND, swelling in lower extremities, anasarca,                                                    dizziness, palpitations Resp: +  shortness of breath with exertion or at rest.             +   productive cough,  + non-productive cough,  No- coughing up of blood.              No-   change in color of mucus.  No- wheezing.  +snores Skin: No-   rash or lesions. GI:  No-   heartburn, indigestion, abdominal pain, nausea, vomiting,  GU:  MS:  No-   joint pain or swelling.  . Neuro-     nothing unusual Psych:  No- change in mood or affect. No depression or anxiety.  No memory loss.  OBJ- Physical Exam General- Alert, Oriented, Affect-appropriate, Distress- none acute Skin- +facial rash c/w rosacea Lymphadenopathy- none Head- atraumatic            Eyes- Gross vision intact, PERRLA, conjunctivae and secretions  clear            Ears- Hearing, canals-normal            Nose- Clear, no-Septal dev, mucus, polyps, erosion, perforation             Throat- Mallampati III-IV , mucosa clear , drainage- none, tonsils- atrophic Neck- flexible , trachea midline, no stridor , thyroid nl, carotid no bruit Chest - symmetrical excursion , unlabored           Heart/CV- RRR , no murmur , no gallop  , no rub, nl s1 s2                           - JVD- none , edema- none, stasis changes- none, varices- none           Lung- +clear but distant, wheeze- none, cough- none , dullness-none, rub- none           Chest wall-  Abd- tender-no, distended-no, bowel sounds-present, HSM- no Br/ Gen/ Rectal- Not done, not indicated Extrem- cyanosis- none, clubbing, none, atrophy- none, strength- nl,             +Nails bitten short Neuro- grossly intact to  observation

## 2015-11-04 ENCOUNTER — Ambulatory Visit (INDEPENDENT_AMBULATORY_CARE_PROVIDER_SITE_OTHER): Payer: BLUE CROSS/BLUE SHIELD | Admitting: Internal Medicine

## 2015-11-04 ENCOUNTER — Other Ambulatory Visit: Payer: Self-pay | Admitting: Internal Medicine

## 2015-11-04 DIAGNOSIS — J42 Unspecified chronic bronchitis: Secondary | ICD-10-CM | POA: Diagnosis not present

## 2015-11-04 DIAGNOSIS — G4733 Obstructive sleep apnea (adult) (pediatric): Secondary | ICD-10-CM

## 2015-11-04 LAB — PULMONARY FUNCTION TEST
DL/VA % pred: 67 %
DL/VA: 3.18 ml/min/mmHg/L
DLCO COR % PRED: 68 %
DLCO UNC: 23.37 ml/min/mmHg
DLCO cor: 23.85 ml/min/mmHg
DLCO unc % pred: 66 %
FEF 25-75 POST: 1.39 L/s
FEF 25-75 Pre: 1.15 L/sec
FEF2575-%Change-Post: 20 %
FEF2575-%Pred-Post: 43 %
FEF2575-%Pred-Pre: 35 %
FEV1-%CHANGE-POST: 6 %
FEV1-%PRED-POST: 67 %
FEV1-%PRED-PRE: 63 %
FEV1-POST: 2.64 L
FEV1-Pre: 2.47 L
FEV1FVC-%Change-Post: 4 %
FEV1FVC-%PRED-PRE: 80 %
FEV6-%Change-Post: 3 %
FEV6-%PRED-POST: 82 %
FEV6-%PRED-PRE: 79 %
FEV6-POST: 4.07 L
FEV6-PRE: 3.91 L
FEV6FVC-%CHANGE-POST: 1 %
FEV6FVC-%PRED-POST: 102 %
FEV6FVC-%PRED-PRE: 100 %
FVC-%CHANGE-POST: 2 %
FVC-%PRED-PRE: 78 %
FVC-%Pred-Post: 80 %
FVC-POST: 4.13 L
FVC-PRE: 4.04 L
PRE FEV6/FVC RATIO: 97 %
Post FEV1/FVC ratio: 64 %
Post FEV6/FVC ratio: 99 %
Pre FEV1/FVC ratio: 61 %
RV % pred: 155 %
RV: 3.62 L
TLC % PRED: 110 %
TLC: 8.2 L

## 2015-11-04 NOTE — Progress Notes (Signed)
PFT done today. 

## 2015-11-13 ENCOUNTER — Telehealth: Payer: Self-pay | Admitting: Internal Medicine

## 2015-11-13 ENCOUNTER — Other Ambulatory Visit: Payer: Self-pay | Admitting: *Deleted

## 2015-11-13 MED ORDER — FLUTICASONE FUROATE-VILANTEROL 200-25 MCG/INH IN AEPB: 1.0000 | INHALATION_SPRAY | Freq: Every day | RESPIRATORY_TRACT | Status: DC

## 2015-11-13 NOTE — Telephone Encounter (Signed)
Patient says Virgel Bouquet works great and wanted refills. Refill sent to pharmacy. Patient aware. Nothing further needed.

## 2015-11-25 ENCOUNTER — Ambulatory Visit (INDEPENDENT_AMBULATORY_CARE_PROVIDER_SITE_OTHER): Payer: BLUE CROSS/BLUE SHIELD | Admitting: Internal Medicine

## 2015-11-25 ENCOUNTER — Encounter: Payer: Self-pay | Admitting: Internal Medicine

## 2015-11-25 VITALS — BP 128/70 | HR 84 | Ht 74.0 in | Wt 218.4 lb

## 2015-11-25 DIAGNOSIS — J449 Chronic obstructive pulmonary disease, unspecified: Secondary | ICD-10-CM

## 2015-11-25 MED ORDER — UMECLIDINIUM-VILANTEROL 62.5-25 MCG/INH IN AEPB: 1.0000 | INHALATION_SPRAY | Freq: Every day | RESPIRATORY_TRACT | Status: DC

## 2015-11-25 NOTE — Patient Instructions (Addendum)
Keep appointment for sleep study   Sample Anoro Ellipta inhaler     Inhale 1 puff, once daily     Try this instead of Breo. See if either device makes a useful difference in your breathing  Appointment back after sleep study to review results

## 2015-11-25 NOTE — Progress Notes (Signed)
10/01/14- 60 yoM referred courtesy of Dr Tomi BambergerSusan Fuller; COPD, SOB, CT chest 09-17-14 showed lung mass.  Stopped smoking 2-1/2 years ago. 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 No history of pneumonia. He has been more aware of dyspnea on exertion for the last 2 years and was told 2 years ago that he had COPD. About 2 weeks ago he had an acute bronchitis syndrome "flu" slowly resolving. Chest x-ray showed question of a right upper lobe nodule. CT chest showed central lobular emphysema, bronchiectasis, and identified the right upper lobe shadow as a prior fracture of the anterior right second rib with bony overgrowth. He says he has had several rib fractures.  Daily cough-dry or with some clear phlegm. No blood. Occasional wheeze. He denies weight loss, exertional chest pain, palpitation, swollen glands, edema or fever.  CT chest 09/17/14-images reviewed with patient IMPRESSION: Underlying emphysema with mild bronchiectatic change. No parenchymal lung mass. The area questioned on the recent chest radiograph is felt to be due to a prior fracture of the anterior second rib on the right. No edema or consolidation. No adenopathy. Hepatic steatosis. Upper thoracic compression fractures are noted, age uncertain. Electronically Signed  By: Bretta BangWilliam Woodruff M.D.  On: 09/17/2014 10:17   07/27/15- 60 year old male former smoker followed for COPD, history of asbestos exposure Follows For: pt returns with xray today. pt states he feels fine, with some effects of the COPD thats expected. pt c/o SOB with exertion and some wheezing on awakening. no c/o chest tightness or cough.  We reviewed recent chest x-ray images showing soft fluffy nodularity upper left lung. Girlfriend tells him he coughs especially in his sleep and as he first wakes up. Clear mucus. Denies night sweats, fever,  purulent or bloody sputum, adenopathy, chest pain or recent infection. No history of TB exposure. Recent Z-Pak. CXR 07/20/15 IMPRESSION: Multiple new pulmonary nodular densities scattered throughout the left lung with associated left lower lobe infiltrate. Infectious etiology is suspected. Malignant etiology cannot be excluded. Follow-up chest x-rays recommended to demonstrate clearing. If these findings do not clear IV contrast-enhanced chest CT should be considered for further evaluation. Electronically Signed  By: Maisie Fushomas Register  On: 07/20/2015 10:27 CXR 07/27/15 IMPRESSION: 1. Nodules are again noted in the left mid and upper lung field, not present on the prior CT of the chest from December 2015. Recommend CT of the chest to assess further. 2. Emphysema. 3. Stable upper thoracic compression deformities. Electronically Signed  By: Dwyane DeePaul Barry M.D.  On: 07/27/2015 11:15 Office Spirometry 07/27/2015-not valid. Despite repetitive coaching he did not generate acceptable curves.  10/28/2015-60 year old male former smoker followed for COPD, history asbestos exposure/lung nodules O2 2 L sleep/[facility] ACUTE VISIT: Increased SOB; cough-productive-white to yellow. Pt states had attack recently from cough(was around heat) and could not breath-almost called 911 but was able to get to cold air and take breath. Eye (Dr Luciana Axeankin [place] put patient on O2-had ONO done as well. Describes episodes of obvious laryngospasm when he walked into a hot store, relieved by cool air and relaxation. We discussed oxygen during sleep. His girlfriend tells him he snores loudly suggesting possible sleep apnea. He has mostly stopped eating meat after our testing previously and is doing better. Developed a cough 2 weeks ago. We discussed lisinopril but he says he does not usually have a cough. They have had a chest infection at the time. Still notices  shortness of breath especially with exertion. Discussed  chest CT showing some improvement in nodules consistent with inflammation. No night sweats. CT chest 07/29/2015 IMPRESSION: Scattered peribronchovascular nodular consolidation throughout the left lung, most likely infectious in etiology, possibly atypical/fungal. Followup PA and lateral chest X-ray is recommended in 3-4 weeks following trial of antibiotic therapy to ensure resolution and exclude underlying malignancy. Electronically Signed  By: Leanna Battles M.D.  On: 07/29/2015 11:49 CXR 10/05/2015 IMPRESSION: No acute cardiopulmonary disease. Nodular opacities previously identified have decreased in size suggesting an infectious etiology. Continued follow-up chest x-rays to demonstrate complete resolution suggested. Electronically Signed  By: Maisie Fus Register  On: 10/05/2015 12:55  11/25/2015-60 year old male former smoker followed for COPD, history asbestos exposure/lung nodules, food (meat) allergy O2 2 L/[facility] FOLLOWS FOR: Pt here to review PFT results; Sleep Study scheduled next month. Pt states he breathing is about the same since last visit. 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 HEENT:   No-  headaches, difficulty swallowing, tooth/dental problems, sore throat,       No-  sneezing, itching, ear ache, nasal congestion, post nasal drip,  CV:  No-   chest pain, orthopnea, PND, swelling in lower extremities, anasarca,                                                    dizziness, palpitations Resp: +  shortness of breath with exertion or at rest.             +   productive cough,  + non-productive cough,  No- coughing up of blood.              No-   change in color of mucus.  No- wheezing.  +snores Skin: No-   rash or lesions. GI:  No-   heartburn, indigestion, abdominal pain, nausea, vomiting,  GU:  MS:  No-   joint pain or swelling.  . Neuro-     nothing  unusual Psych:  No- change in mood or affect. No depression or anxiety.  No memory loss.  OBJ- Physical Exam General- Alert, Oriented, Affect-appropriate, Distress- none acute Skin- +facial rash c/w rosacea Lymphadenopathy- none Head- atraumatic            Eyes- Gross vision intact, PERRLA, conjunctivae and secretions clear            Ears- Hearing, canals-normal            Nose- Clear, no-Septal dev, mucus, polyps, erosion, perforation             Throat- Mallampati III-IV , mucosa clear , drainage- none, tonsils- atrophic Neck- flexible , trachea midline, no stridor , thyroid nl, carotid no bruit Chest - symmetrical excursion , unlabored           Heart/CV- RRR , no murmur , no gallop  , no rub, nl s1 s2                           - JVD- none , edema- none, stasis changes- none, varices- none           Lung- +clear but distant, wheeze- none, cough- none , dullness-none, rub- none  Chest wall-  Abd- tender-no, distended-no, bowel sounds-present, HSM- no Br/ Gen/ Rectal- Not done, not indicated Extrem- cyanosis- none, clubbing, none, atrophy- none, strength- nl,             +Nails bitten short Neuro- grossly intact to observation

## 2015-12-01 ENCOUNTER — Telehealth: Payer: Self-pay | Admitting: *Deleted

## 2015-12-01 NOTE — Telephone Encounter (Signed)
Received refill request for Diclofenac.  Dr. Charlsie Merlesegal states pt needs an appt prior to refills.  Return fax as denied and pt needs an appt.

## 2016-01-12 ENCOUNTER — Ambulatory Visit (HOSPITAL_BASED_OUTPATIENT_CLINIC_OR_DEPARTMENT_OTHER): Payer: BLUE CROSS/BLUE SHIELD

## 2016-01-27 ENCOUNTER — Ambulatory Visit: Payer: BLUE CROSS/BLUE SHIELD | Admitting: Internal Medicine

## 2016-03-04 ENCOUNTER — Encounter (HOSPITAL_BASED_OUTPATIENT_CLINIC_OR_DEPARTMENT_OTHER): Payer: BLUE CROSS/BLUE SHIELD

## 2016-03-30 ENCOUNTER — Ambulatory Visit (INDEPENDENT_AMBULATORY_CARE_PROVIDER_SITE_OTHER): Payer: BLUE CROSS/BLUE SHIELD | Admitting: Podiatry

## 2016-03-30 ENCOUNTER — Ambulatory Visit (INDEPENDENT_AMBULATORY_CARE_PROVIDER_SITE_OTHER): Payer: BLUE CROSS/BLUE SHIELD

## 2016-03-30 ENCOUNTER — Encounter: Payer: Self-pay | Admitting: Podiatry

## 2016-03-30 DIAGNOSIS — M779 Enthesopathy, unspecified: Secondary | ICD-10-CM

## 2016-03-30 DIAGNOSIS — M79671 Pain in right foot: Secondary | ICD-10-CM

## 2016-03-30 DIAGNOSIS — M79672 Pain in left foot: Secondary | ICD-10-CM | POA: Diagnosis not present

## 2016-03-30 DIAGNOSIS — M722 Plantar fascial fibromatosis: Secondary | ICD-10-CM | POA: Diagnosis not present

## 2016-03-30 MED ORDER — DICLOFENAC SODIUM 75 MG PO TBEC: 75.0000 mg | DELAYED_RELEASE_TABLET | Freq: Two times a day (BID) | ORAL | Status: DC

## 2016-03-30 NOTE — Progress Notes (Signed)
Subjective:     Patient ID: [name], male   DOB: 07/11/1956, 60 y.o.   MRN: [id number]  HPI patient presents with discomfort in the forefoot bilateral and also heels and does not remember specific injury and states it's been about one month   Review of Systems     Objective:   Physical Exam Neurovascular status intact muscle strength adequate discomfort in the heels and forefoot that's mild in intensity with no specific areas of intense discomfort    Assessment:     Mild fasciitis capsulitis-like symptomatology    Plan:     Advised on physical therapy anti-inflammatories supportive shoe gear and placed on diclofenac 75 mg twice a day to reduce inflammation  X-rays were negative for signs of fracture with no indications of advanced arthritis

## 2016-03-31 ENCOUNTER — Ambulatory Visit: Payer: BLUE CROSS/BLUE SHIELD | Admitting: Internal Medicine

## 2016-04-14 ENCOUNTER — Other Ambulatory Visit: Payer: Self-pay | Admitting: Internal Medicine

## 2016-04-22 ENCOUNTER — Encounter (HOSPITAL_BASED_OUTPATIENT_CLINIC_OR_DEPARTMENT_OTHER): Payer: BLUE CROSS/BLUE SHIELD

## 2016-08-22 ENCOUNTER — Telehealth: Payer: Self-pay | Admitting: *Deleted

## 2016-08-22 MED ORDER — DICLOFENAC SODIUM 75 MG PO TBEC: 75.0000 mg | DELAYED_RELEASE_TABLET | Freq: Two times a day (BID) | ORAL | 2 refills | Status: DC

## 2016-08-22 NOTE — Telephone Encounter (Signed)
Received request for diclofenac refill #50. Dr. Everlena Cooperegal okayed #50 and pt needs an appt prior to future refills.

## 2016-10-21 ENCOUNTER — Other Ambulatory Visit: Payer: Self-pay | Admitting: Internal Medicine

## 2016-11-22 ENCOUNTER — Telehealth: Payer: Self-pay | Admitting: Internal Medicine

## 2016-11-22 NOTE — Telephone Encounter (Signed)
Spoke with pt's girlfriend, [name]. States that pt is complaining of his lungs "shutting down." Reports SOB, chest tightness, wheezing and cough. Denies fever. Symptoms started on Sunday. [name] states that the pt tried some OTC cough syrup with no relief. Pt has not been seen since 11/2015. We do not have any available appointments this week.  CY - please advise. Thanks.  No Known Allergies Current Outpatient Prescriptions on File Prior to Visit  Medication Sig Dispense Refill  . Aclidinium Bromide (TUDORZA PRESSAIR IN) Inhale 1 puff into the lungs daily.    . Aflibercept 2 MG/0.05ML SOLN Inject into the eye. Injection every 5 weeks    . albuterol (PROVENTIL HFA;VENTOLIN HFA) 108 (90 BASE) MCG/ACT inhaler Inhale 2 puffs into the lungs every 6 (six) hours as needed for wheezing or shortness of breath.    . benzonatate (TESSALON) 200 MG capsule Take 1 capsule (200 mg total) by mouth 3 (three) times daily as needed for cough. 30 capsule 1  . BREO ELLIPTA 200-25 MCG/INH AEPB INHALE 1 PUFF INTO LUNGS DAILY.  RINSE MOUTH 60 each 5  . diclofenac (VOLTAREN) 75 MG EC tablet Take 1 tablet (75 mg total) by mouth 2 (two) times daily. In the morning and in the afternoon 30 tablet 0  . diclofenac (VOLTAREN) 75 MG EC tablet Take 1 tablet (75 mg total) by mouth 2 (two) times daily. 50 tablet 2  . ibuprofen (ADVIL,MOTRIN) 200 MG tablet Take 800 mg by mouth every 6 (six) hours as needed for moderate pain.     Marland Kitchen. lisinopril (PRINIVIL,ZESTRIL) 20 MG tablet Take 20 mg by mouth every morning.    . Multiple Vitamins-Minerals (MEGA MULTI MEN PO) Take 1 tablet by mouth daily.    . pantoprazole (PROTONIX) 40 MG tablet Take 40 mg by mouth every morning.    . umeclidinium-vilanterol (ANORO ELLIPTA) 62.5-25 MCG/INH AEPB Inhale 1 puff into the lungs daily. 1 each 0   No current facility-administered medications on file prior to visit.

## 2016-11-22 NOTE — Telephone Encounter (Signed)
Pt can be seen Thursday 11-24-16 at 4:30pm slot. Thanks.

## 2016-11-22 NOTE — Telephone Encounter (Signed)
Pt scheduled 11/24/16 @ 430p with CY. Nothing further needed.

## 2016-11-24 ENCOUNTER — Encounter: Payer: Self-pay | Admitting: Internal Medicine

## 2016-11-24 ENCOUNTER — Ambulatory Visit (INDEPENDENT_AMBULATORY_CARE_PROVIDER_SITE_OTHER): Payer: BLUE CROSS/BLUE SHIELD | Admitting: Internal Medicine

## 2016-11-24 ENCOUNTER — Ambulatory Visit (INDEPENDENT_AMBULATORY_CARE_PROVIDER_SITE_OTHER)
Admission: RE | Admit: 2016-11-24 | Discharge: 2016-11-24 | Disposition: A | Discharge status: Home / Self Care | Payer: BLUE CROSS/BLUE SHIELD | Source: Ambulatory Visit | Attending: Internal Medicine | Admitting: Internal Medicine

## 2016-11-24 VITALS — BP 122/74 | HR 80 | Ht 74.0 in | Wt 199.4 lb

## 2016-11-24 DIAGNOSIS — J441 Chronic obstructive pulmonary disease with (acute) exacerbation: Secondary | ICD-10-CM | POA: Diagnosis not present

## 2016-11-24 DIAGNOSIS — R918 Other nonspecific abnormal finding of lung field: Secondary | ICD-10-CM | POA: Diagnosis not present

## 2016-11-24 DIAGNOSIS — J449 Chronic obstructive pulmonary disease, unspecified: Secondary | ICD-10-CM | POA: Diagnosis not present

## 2016-11-24 MED ORDER — TIOTROPIUM BROMIDE-OLODATEROL 2.5-2.5 MCG/ACT IN AERS: 2.0000 | INHALATION_SPRAY | Freq: Every day | RESPIRATORY_TRACT | 12 refills | Status: DC

## 2016-11-24 MED ORDER — TIOTROPIUM BROMIDE-OLODATEROL 2.5-2.5 MCG/ACT IN AERS: 2.0000 | INHALATION_SPRAY | Freq: Every day | RESPIRATORY_TRACT | 0 refills | Status: DC

## 2016-11-24 MED ORDER — GLYCOPYRROLATE-FORMOTEROL 9-4.8 MCG/ACT IN AERO: 2.0000 | INHALATION_SPRAY | Freq: Two times a day (BID) | RESPIRATORY_TRACT | 12 refills | Status: DC

## 2016-11-24 MED ORDER — LEVALBUTEROL HCL 0.63 MG/3ML IN NEBU
0.6300 mg | INHALATION_SOLUTION | Freq: Once | RESPIRATORY_TRACT | Status: AC
  Administered 2016-11-24: 0.63 mg via RESPIRATORY_TRACT

## 2016-11-24 MED ORDER — PREDNISONE 10 MG PO TABS: ORAL_TABLET | ORAL | 0 refills | Status: DC

## 2016-11-24 MED ORDER — ALBUTEROL SULFATE HFA 108 (90 BASE) MCG/ACT IN AERS: 2.0000 | INHALATION_SPRAY | Freq: Four times a day (QID) | RESPIRATORY_TRACT | 12 refills | Status: DC | PRN

## 2016-11-24 MED ORDER — METHYLPREDNISOLONE ACETATE 80 MG/ML IJ SUSP
80.0000 mg | Freq: Once | INTRAMUSCULAR | Status: AC
  Administered 2016-11-24: 80 mg via INTRAMUSCULAR

## 2016-11-24 NOTE — Assessment & Plan Note (Signed)
Exacerbation of COPD with. Occupational dust exposure but I think he probably also had infection triggering bronchitis-possibly viral Plan-nebulizer treatments Xopenex, Depo-Medrol, prednisone taper, try change from Breo/ Tudorza to [employer] Corporation[employer]iolto for comparison. CXR. He needs to minimize dust exposure-recommending more routine use of respirator mask

## 2016-11-24 NOTE — Assessment & Plan Note (Signed)
On previous imaging these were acting inflammatory and beginning to shrink. Plan-CXR

## 2016-11-24 NOTE — Progress Notes (Signed)
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 Despite repetitive coaching he did not generate acceptable curves.  Quant TB Gold- NEG Alpha-Gal- high  ----------------------------------------------------------------------------------------------------  11/25/2015-61 year old male former smoker followed for COPD, history asbestos exposure/lung nodules, food (meat) allergy O2 2 L/[facility] FOLLOWS FOR: Pt here to review PFT results; Sleep Study scheduled next month. Pt states he breathing is about the same since last visit. Quant TB Gold- NEG Alpha-Gal- high  11/25/2811-61 year old male former smoker followed for COPD, history asbestos exposure/lung nodules, food (meat) allergy PFT 11/04/2015-moderate obstruction, mild diffusion deficit, no response to dilator Increased of SOB and cough over the past few days. Productive cough that started with green mucus but changed over to clear phlegm recently.  He had been working in a Psychiatrist for 3 weeks, initially with a simple particle mask and then with a respirator mask. "Lead oxide dust everywhere" similar exacerbation a few years ago in a different dusty work environment, responded then to prednisone. Chest feels tight. Has been using [place] Now very tight especially in the last couple of days, without fever or chills, blood or chest pain  ROS-see HPI Constitutional:   No-   weight loss, night sweats, fevers, chills, fatigue, lassitude. HEENT:   No-  headaches, difficulty swallowing, tooth/dental problems, sore throat,       No-  sneezing, itching, ear ache, nasal congestion, post nasal drip,  CV:  No-   chest pain, orthopnea, PND, swelling in lower extremities, anasarca,                                                    dizziness, palpitations Resp: +  shortness of breath with exertion or at rest.              +   productive cough,  + non-productive cough,  No- coughing up of blood.         + change in color of mucus.  No- wheezing.  +snores Skin: No-   rash or lesions. GI:  No-   heartburn, indigestion, abdominal pain, nausea, vomiting,  GU:  MS:  No-   joint pain or swelling.  . Neuro-     nothing unusual Psych:  No- change in mood or affect. No depression or anxiety.  No memory loss.  OBJ- Physical Exam General- Alert, Oriented, Affect-appropriate, Distress- none acute Skin- +facial rash c/w rosacea Lymphadenopathy- none Head- atraumatic            Eyes- Gross vision intact, PERRLA, conjunctivae and secretions clear            Ears- Hearing, canals-normal            Nose- Clear, no-Septal dev, mucus, polyps, erosion, perforation             Throat- Mallampati III-IV , mucosa clear , drainage- none, tonsils- atrophic Neck- flexible , trachea midline, no stridor , thyroid nl, carotid no bruit Chest - symmetrical excursion , unlabored           Heart/CV- RRR , no murmur , no gallop  , no rub, nl s1 s2                           -  JVD- none , edema- none, stasis changes- none, varices- none           Lung- +clear but distant, wheeze- none, cough- none , dullness-none, rub- none           Chest wall-  Abd- tender-no, distended-no, bowel sounds-present, HSM- no Br/ Gen/ Rectal- Not done, not indicated Extrem- cyanosis- none, clubbing, none, atrophy- none, strength- nl,             +Nails bitten short Neuro- grossly intact to observation

## 2016-11-24 NOTE — Progress Notes (Signed)
Patient seen in the office today and instructed on use of Stiolto.  Patient expressed understanding and demonstrated technique. Shaleta Ruacho CMA 11/24/16 

## 2016-11-24 NOTE — Patient Instructions (Addendum)
Neb xop 0.63  Dx exacerbation COPD  Depo 80       Script sent for Zpak and prednisone taper  Order- CXR   Sample and script printed for Stiolto inhaler   Inhale 2 puffs, once daily    Try this instead of Tudorza  Ok to use your nebulizer up to 4 times daily if needed  Please call as needed

## 2016-11-25 ENCOUNTER — Telehealth: Payer: Self-pay | Admitting: Internal Medicine

## 2016-11-25 MED ORDER — AZITHROMYCIN 250 MG PO TABS: ORAL_TABLET | ORAL | 0 refills | Status: DC

## 2016-11-25 NOTE — Telephone Encounter (Signed)
Spoke with pharmacist and they did not get the rx was pts z pack. I have sent that in. Nothing further is needed at this time.    4. Waymon Budge, MD (Physician) at 11/24/2016 4:53 PM - Signed    Neb xop 0.63  Dx exacerbation COPD  Depo 80       Script sent for Zpak and prednisone taper  Order- CXR   Sample and script printed for Stiolto inhaler   Inhale 2 puffs, once daily    Try this instead of Tudorza  Ok to use your nebulizer up to 4 times daily if needed  Please call as needed

## 2016-11-30 ENCOUNTER — Telehealth: Payer: Self-pay

## 2016-11-30 NOTE — Telephone Encounter (Signed)
I called pt to advise him of his results and he wanted to see if CY can call in another antibiotic. He states his sinus infection did not go completely away. He informed me that the ZPAK never clears his sinus infection completely. Please advise. Thanks

## 2016-12-01 MED ORDER — AMOXICILLIN-POT CLAVULANATE 875-125 MG PO TABS: 1.0000 | ORAL_TABLET | Freq: Two times a day (BID) | ORAL | 0 refills | Status: DC

## 2016-12-01 NOTE — Telephone Encounter (Signed)
Spoke with pt. He is aware of CY's recommendation. Rx has been sent in. Nothing further was needed. 

## 2016-12-01 NOTE — Telephone Encounter (Signed)
Offer augmentin 875 mg, # 14, 1 twice daily, refill x 1 

## 2017-01-30 ENCOUNTER — Other Ambulatory Visit: Payer: Self-pay | Admitting: Internal Medicine

## 2017-01-30 NOTE — Telephone Encounter (Signed)
CY Please advise on refill. Pt last seen 11-2016. Thanks.

## 2017-01-31 ENCOUNTER — Telehealth: Payer: Self-pay | Admitting: Internal Medicine

## 2017-01-31 MED ORDER — PREDNISONE 10 MG PO TABS: ORAL_TABLET | ORAL | 0 refills | Status: DC

## 2017-01-31 NOTE — Telephone Encounter (Signed)
I think we sent that earlier. If not, then pred 10 mg, # 20, 4 X 2 DAYS, 3 X 2 DAYS, 2 X 2 DAYS, 1 X 2 DAYS

## 2017-01-31 NOTE — Telephone Encounter (Signed)
Pt c/o increased chest tightness, sob, prod cough with green mucus.  Denies fever, sharp chest pains, sinus congestion.  S/s present X4-5 days.  Pt has been taking maintenance inhalers to help with symptoms and nothing else.  Requesting a pred taper.    Pt uses [facility].    CY please advise if ok to refill.  Thanks!

## 2017-01-31 NOTE — Telephone Encounter (Signed)
Ok to refill 

## 2017-01-31 NOTE — Telephone Encounter (Signed)
lmomtcb x 1 for the pt 

## 2017-01-31 NOTE — Telephone Encounter (Signed)
Spoke with the pt and notified that rx was sent

## 2017-01-31 NOTE — Telephone Encounter (Signed)
lmtcb for pt. Rx sent to preferred pharmacy.  

## 2017-01-31 NOTE — Telephone Encounter (Signed)
Patient returned phone call..ert ° °

## 2017-05-24 ENCOUNTER — Encounter: Payer: Self-pay | Admitting: Podiatry

## 2017-05-24 ENCOUNTER — Ambulatory Visit (INDEPENDENT_AMBULATORY_CARE_PROVIDER_SITE_OTHER): Payer: BLUE CROSS/BLUE SHIELD | Admitting: Podiatry

## 2017-05-24 DIAGNOSIS — M722 Plantar fascial fibromatosis: Secondary | ICD-10-CM | POA: Diagnosis not present

## 2017-05-24 MED ORDER — TRIAMCINOLONE ACETONIDE 10 MG/ML IJ SUSP
10.0000 mg | Freq: Once | INTRAMUSCULAR | Status: AC
  Administered 2017-05-24: 10 mg

## 2017-05-24 MED ORDER — TRIAMCINOLONE ACETONIDE 10 MG/ML IJ SUSP: 10.0000 mg | Freq: Once | INTRAMUSCULAR | Status: DC

## 2017-05-24 NOTE — Progress Notes (Signed)
Subjective:    Patient ID: Charles ReachVoigt W Laughter Brewer, male   DOB: 61 y.o.   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 Discussed physical therapy for condition and reappoint for us to recheck

## 2017-06-05 ENCOUNTER — Other Ambulatory Visit: Payer: BLUE CROSS/BLUE SHIELD | Admitting: Orthotics

## 2017-06-08 ENCOUNTER — Telehealth: Payer: Self-pay | Admitting: Internal Medicine

## 2017-06-08 ENCOUNTER — Encounter: Payer: Self-pay | Admitting: Podiatry

## 2017-06-08 ENCOUNTER — Ambulatory Visit (INDEPENDENT_AMBULATORY_CARE_PROVIDER_SITE_OTHER): Payer: BLUE CROSS/BLUE SHIELD | Admitting: Podiatry

## 2017-06-08 DIAGNOSIS — M722 Plantar fascial fibromatosis: Secondary | ICD-10-CM | POA: Diagnosis not present

## 2017-06-08 MED ORDER — TRIAMCINOLONE ACETONIDE 10 MG/ML IJ SUSP
10.0000 mg | Freq: Once | INTRAMUSCULAR | Status: AC
  Administered 2017-06-08: 10 mg

## 2017-06-08 MED ORDER — PREDNISONE 10 MG PO TABS: ORAL_TABLET | ORAL | 0 refills | Status: DC

## 2017-06-08 NOTE — Telephone Encounter (Deleted)
CY - please advise. Thanks! 

## 2017-06-08 NOTE — Telephone Encounter (Signed)
Note on ACEi/ not my pt

## 2017-06-08 NOTE — Progress Notes (Signed)
Subjective:    Patient ID: [name], male   DOB: 61 y.o.   MRN: [id number]   HPI patient states that his heel is feeling quite a bit better but is still sore in one area and also he continues to develop forefoot pain bilateral with inflammation of the metatarsal joints    ROS      Objective:  Physical Exam neurovascular status intact with patient noted to have discomfort in the plantar fascial left of a moderate nature and does have a high arch foot structure with forefoot pressure that's been chronic bilateral. 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 Today I went ahead and I scheduled him with Raiford Noble to have orthotics made that we'll need to have thick metatarsal padding to reduce pressure on the metatarsals and also provide for moderate support but not too much as I believe it will be hard for him to tolerate. He is scheduled with Raiford Noble for this. I then went ahead and injected the left plantar fashion 3 mg Kenalog 5 mg Xylocaine

## 2017-06-08 NOTE — Telephone Encounter (Signed)
Called and spoke with pt and he is aware of CY recs.  pred taper has been sent to his pharmacy and nothing further is needed.

## 2017-06-08 NOTE — Telephone Encounter (Addendum)
Called and spoke with pt.  Pt reports of increased sob &  prod cough with green mucus x1w. Pt is requesting Rx to be sent to [facility] pharmacy, as this has helped previously.  CY please advise. Thanks.    Current Outpatient Prescriptions on File Prior to Visit  Medication Sig Dispense Refill  . Aclidinium Bromide (TUDORZA PRESSAIR IN) Inhale 1 puff into the lungs daily.    . Aflibercept 2 MG/0.05ML SOLN Inject into the eye. Injection every 5 weeks    . albuterol (PROVENTIL HFA;VENTOLIN HFA) 108 (90 Base) MCG/ACT inhaler Inhale 2 puffs into the lungs every 6 (six) hours as needed for wheezing or shortness of breath. 1 Inhaler 12  . diclofenac (VOLTAREN) 75 MG EC tablet Take 1 tablet (75 mg total) by mouth 2 (two) times daily. In the morning and in the afternoon 30 tablet 0  . ibuprofen (ADVIL,MOTRIN) 200 MG tablet Take 800 mg by mouth every 6 (six) hours as needed for moderate pain.     Marland Kitchen lisinopril (PRINIVIL,ZESTRIL) 20 MG tablet Take 20 mg by mouth every morning.    . Multiple Vitamins-Minerals (MEGA MULTI MEN PO) Take 1 tablet by mouth daily.    . pantoprazole (PROTONIX) 40 MG tablet Take 40 mg by mouth every morning.    . Tiotropium Bromide-Olodaterol (STIOLTO RESPIMAT) 2.5-2.5 MCG/ACT AERS Inhale 2 puffs into the lungs daily. 1 Inhaler 12   Current Facility-Administered Medications on File Prior to Visit  Medication Dose Route Frequency Provider Last Rate Last Dose  . triamcinolone acetonide (KENALOG) 10 MG/ML injection 10 mg  10 mg Other Once Lenn Sink, DPM        No Known Allergies

## 2017-06-08 NOTE — Telephone Encounter (Signed)
lmtcb x1 for pt. 

## 2017-06-08 NOTE — Telephone Encounter (Signed)
Ok prednisone 10 mg, # 20, 4 X 2 DAYS, 3 X 2 DAYS, 2 X 2 DAYS, 1 X 2 DAYS  

## 2017-06-08 NOTE — Telephone Encounter (Signed)
Pt returning call, was @ work and couldn't f hear phone he can be reached @ [phone].Caren Griffins

## 2017-06-12 ENCOUNTER — Other Ambulatory Visit: Payer: Self-pay | Admitting: Internal Medicine

## 2017-06-13 ENCOUNTER — Other Ambulatory Visit: Payer: BLUE CROSS/BLUE SHIELD | Admitting: Orthotics

## 2017-06-22 ENCOUNTER — Ambulatory Visit (INDEPENDENT_AMBULATORY_CARE_PROVIDER_SITE_OTHER): Payer: BLUE CROSS/BLUE SHIELD | Admitting: Orthotics

## 2017-06-22 DIAGNOSIS — M722 Plantar fascial fibromatosis: Secondary | ICD-10-CM

## 2017-06-22 DIAGNOSIS — M79671 Pain in right foot: Secondary | ICD-10-CM

## 2017-06-22 DIAGNOSIS — M79672 Pain in left foot: Secondary | ICD-10-CM

## 2017-06-22 NOTE — Progress Notes (Signed)

## 2017-07-13 ENCOUNTER — Ambulatory Visit: Payer: BLUE CROSS/BLUE SHIELD | Admitting: Orthotics

## 2017-07-13 DIAGNOSIS — M722 Plantar fascial fibromatosis: Secondary | ICD-10-CM

## 2017-07-13 DIAGNOSIS — M79671 Pain in right foot: Secondary | ICD-10-CM

## 2017-07-13 DIAGNOSIS — M779 Enthesopathy, unspecified: Secondary | ICD-10-CM

## 2017-07-13 DIAGNOSIS — M79672 Pain in left foot: Secondary | ICD-10-CM

## 2017-07-13 NOTE — Progress Notes (Signed)
Patient came in today to pick up custom made foot orthotics.  The goals were accomplished and the patient reported no dissatisfaction with said orthotics.  Patient was advised of breakin period and how to report any issues. 

## 2017-08-11 ENCOUNTER — Other Ambulatory Visit: Payer: Self-pay | Admitting: Internal Medicine

## 2017-08-25 ENCOUNTER — Other Ambulatory Visit: Payer: Self-pay | Admitting: Internal Medicine

## 2017-10-11 ENCOUNTER — Other Ambulatory Visit: Payer: Self-pay | Admitting: Internal Medicine

## 2017-10-23 ENCOUNTER — Telehealth: Payer: Self-pay | Admitting: Internal Medicine

## 2017-10-23 MED ORDER — PREDNISONE 10 MG PO TABS: ORAL_TABLET | ORAL | 0 refills | Status: DC

## 2017-10-23 NOTE — Telephone Encounter (Signed)
Spoke with pt. States that he does not feel good. Reports cough, chest congestion, SOB. Cough is producing green/yellow mucus. Denies chest tightness, wheezing, fever/chills. Symptoms started 3-4 days ago. He would like to have a prednisone prescription sent in.  CY - please advise. Thanks.  No Known Allergies  Current Outpatient Medications on File Prior to Visit  Medication Sig Dispense Refill  . Aclidinium Bromide (TUDORZA PRESSAIR IN) Inhale 1 puff into the lungs daily.    . Aflibercept 2 MG/0.05ML SOLN Inject into the eye. Injection every 5 weeks    . albuterol (PROVENTIL HFA;VENTOLIN HFA) 108 (90 Base) MCG/ACT inhaler Inhale 2 puffs into the lungs every 6 (six) hours as needed for wheezing or shortness of breath. 1 Inhaler 12  . BREO ELLIPTA 200-25 MCG/INH AEPB INHALE 1 PUFF INTO LUNGS DAILY.  RINSE MOUTH 60 each 1  . diclofenac (VOLTAREN) 75 MG EC tablet Take 1 tablet (75 mg total) by mouth 2 (two) times daily. In the morning and in the afternoon 30 tablet 0  . ibuprofen (ADVIL,MOTRIN) 200 MG tablet Take 800 mg by mouth every 6 (six) hours as needed for moderate pain.     Marland Kitchen. lisinopril (PRINIVIL,ZESTRIL) 20 MG tablet Take 20 mg by mouth every morning.    . Multiple Vitamins-Minerals (MEGA MULTI MEN PO) Take 1 tablet by mouth daily.    . pantoprazole (PROTONIX) 40 MG tablet Take 40 mg by mouth every morning.    . predniSONE (DELTASONE) 10 MG tablet Take 4 tabs x 2 days, 3 tabs x 2 days, 2 tabs x 2 days, 1 tab x 2 days then stop 20 tablet 0  . Tiotropium Bromide-Olodaterol (STIOLTO RESPIMAT) 2.5-2.5 MCG/ACT AERS Inhale 2 puffs into the lungs daily. 1 Inhaler 12  . TUDORZA PRESSAIR 400 MCG/ACT AEPB INHALE ONE PUFF TWICE A DAY 1 each 3   Current Facility-Administered Medications on File Prior to Visit  Medication Dose Route Frequency Provider Last Rate Last Dose  . triamcinolone acetonide (KENALOG) 10 MG/ML injection 10 mg  10 mg Other Once Lenn Sinkegal, Norman S, DPM

## 2017-10-23 NOTE — Telephone Encounter (Signed)
Spoke with pt. He is aware of CY's recommendation. Rx has been sent in. Nothing further was needed. 

## 2017-10-23 NOTE — Telephone Encounter (Signed)
Prednisone 10 mg, # 20, 4 X 2 DAYS, 3 X 2 DAYS, 2 X 2 DAYS, 1 X 2 DAYS  

## 2017-11-09 ENCOUNTER — Ambulatory Visit: Payer: BLUE CROSS/BLUE SHIELD | Admitting: Internal Medicine

## 2017-11-09 ENCOUNTER — Encounter: Payer: Self-pay | Admitting: Internal Medicine

## 2017-11-09 ENCOUNTER — Ambulatory Visit (INDEPENDENT_AMBULATORY_CARE_PROVIDER_SITE_OTHER)
Admission: RE | Admit: 2017-11-09 | Discharge: 2017-11-09 | Disposition: A | Discharge status: Home / Self Care | Payer: BLUE CROSS/BLUE SHIELD | Source: Ambulatory Visit | Attending: Internal Medicine | Admitting: Internal Medicine

## 2017-11-09 VITALS — BP 128/82 | HR 92 | Ht 74.0 in | Wt 215.8 lb

## 2017-11-09 DIAGNOSIS — J441 Chronic obstructive pulmonary disease with (acute) exacerbation: Secondary | ICD-10-CM

## 2017-11-09 DIAGNOSIS — G4734 Idiopathic sleep related nonobstructive alveolar hypoventilation: Secondary | ICD-10-CM | POA: Diagnosis not present

## 2017-11-09 DIAGNOSIS — J449 Chronic obstructive pulmonary disease, unspecified: Secondary | ICD-10-CM | POA: Diagnosis not present

## 2017-11-09 DIAGNOSIS — H353290 Exudative age-related macular degeneration, unspecified eye, stage unspecified: Secondary | ICD-10-CM | POA: Insufficient documentation

## 2017-11-09 MED ORDER — METHYLPREDNISOLONE ACETATE 80 MG/ML IJ SUSP
80.0000 mg | Freq: Once | INTRAMUSCULAR | Status: AC
  Administered 2017-11-09: 80 mg via INTRAMUSCULAR

## 2017-11-09 MED ORDER — DOXYCYCLINE HYCLATE 100 MG PO TABS: 100.0000 mg | ORAL_TABLET | Freq: Two times a day (BID) | ORAL | 0 refills | Status: DC

## 2017-11-09 MED ORDER — COMPRESSOR NEBULIZER MISC: 1.0000 [IU] | Freq: Once | 0 refills | Status: AC

## 2017-11-09 MED ORDER — LEVALBUTEROL HCL 0.63 MG/3ML IN NEBU
0.6300 mg | INHALATION_SOLUTION | Freq: Once | RESPIRATORY_TRACT | Status: AC
  Administered 2017-11-09: 0.63 mg via RESPIRATORY_TRACT

## 2017-11-09 MED ORDER — IPRATROPIUM-ALBUTEROL 0.5-2.5 (3) MG/3ML IN SOLN: 3.0000 mL | Freq: Four times a day (QID) | RESPIRATORY_TRACT | 12 refills | Status: DC | PRN

## 2017-11-09 NOTE — Assessment & Plan Note (Signed)
Mild exacerbation may have begun as viral illness. Plan-add home nebulizer with DuoNeb.  Nebulizer treatment Xopenex and Depo-Medrol today.  Doxycycline.  Add humidifier to home concentrator.

## 2017-11-09 NOTE — Progress Notes (Signed)
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 Despite repetitive coaching he did not generate acceptable curves.  Quant TB Gold- NEG Alpha-Gal- high PFT 11/04/2015-moderate obstruction, mild diffusion deficit, no response to dilator ----------------------------------------------------------------------------------------------------  11/24/16-62 year old male former smoker followed for COPD, history asbestos exposure/lung nodules, food (meat) allergy PFT 11/04/2015-moderate obstruction, mild diffusion deficit, no response to dilator Increased of SOB and cough over the past few days. Productive cough that started with green mucus but changed over to clear phlegm recently.  He had been working in a Psychiatristbattery plant for 3 weeks, initially with a simple particle mask and then with a respirator mask. "Lead oxide dust everywhere" similar exacerbation a few years ago in a different dusty work environment, responded then to prednisone. Chest feels tight. 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  We discussed potential rental from [facility]. 1 month exacerbation.  Prednisone taper helps some.  Some scant green sputum.  Walks miles daily for work and also does some exercise.  He is not sure that Brio helps at all.  Carlos Americanudorza does not help as much as it used to. Walk Test 11/09/17-O2 saturation nadir 92% CXR 11/24/16-  IMPRESSION: No acute  abnormality noted.  ROS-see HPI + = positive Constitutional:   No-   weight loss, night sweats, fevers, chills, fatigue, lassitude. HEENT:   No-  headaches, difficulty swallowing, tooth/dental problems, sore throat,       No-  sneezing, itching, ear ache, nasal congestion, post nasal drip,  CV:  No-   chest pain, orthopnea, PND, swelling in lower extremities, anasarca,                                                    dizziness, palpitations Resp: +  shortness of breath with exertion or at rest.             +   productive cough,  + non-productive cough,  No- coughing up of blood.         + change in color of mucus.  No- wheezing.  +snores Skin: No-   rash or lesions. GI:  No-   heartburn, indigestion, abdominal pain, nausea, vomiting,  GU:  MS:  No-   joint pain or swelling.  . Neuro-     nothing unusual Psych:  No- change in mood or affect. No depression or anxiety.  No memory loss.  OBJ- Physical Exam General- Alert, Oriented, Affect-appropriate, Distress- none acute Skin- +facial rash c/w rosacea Lymphadenopathy- none Head- atraumatic            Eyes- Gross vision intact, PERRLA, conjunctivae and secretions clear            Ears- Hearing, canals-normal            Nose- Clear, no-Septal dev, mucus, polyps, erosion, perforation  Throat- Mallampati III-IV , mucosa clear , drainage- none, tonsils- atrophic Neck- flexible , trachea midline, no stridor , thyroid nl, carotid no bruit Chest - symmetrical excursion , unlabored           Heart/CV- RRR , no murmur , no gallop  , no rub, nl s1 s2                           - JVD- none , edema- none, stasis changes- none, varices- none           Lung- +clear but distant, wheeze- none, cough + dry rattle, dullness-none, rub- none           Chest wall-  Abd- tender-no, distended-no, bowel sounds-present, HSM- no Br/ Gen/ Rectal- Not done, not indicated Extrem- cyanosis- none, clubbing, none, atrophy- none, strength- nl,              +Nails bitten short Neuro-+ tremor

## 2017-11-09 NOTE — Patient Instructions (Addendum)
Script sent for doxycycline antibiotic  Script printed for compressor nebulizer  And for ipratropium-albuterol neb solution  Order- [employer]- please add humidifier to home O2 concentrator                           POC rental for out of town travel 2L during sleep  Order- Walk test room air- qualifying  Order- CXR  Dx COPD exacerbation  Order- neb xop 0.63                Depo 80

## 2017-11-09 NOTE — Assessment & Plan Note (Signed)
Qualify for daytime oxygen with exertion based on walk test at this visit.  He may be able to rent a portable concentrator for use during sleep on work related out of town trips.

## 2017-11-10 ENCOUNTER — Telehealth: Payer: Self-pay | Admitting: Internal Medicine

## 2017-11-10 DIAGNOSIS — R918 Other nonspecific abnormal finding of lung field: Secondary | ICD-10-CM

## 2017-11-10 NOTE — Progress Notes (Signed)
LMTCB on preferred phone number listed for patient. 

## 2017-11-10 NOTE — Telephone Encounter (Signed)
Notes recorded by Waymon BudgeYoung, Clinton D, MD on 11/10/2017 at 10:34 AM EST CXR- there are small patch spots in the left lung that may be infection. I would like a repeat CXR in 3 weeks or so to make sure they are clearing.  Please order outpatient CXR future in 3 weeks Dx lung nodules  Advised pt of results. Pt understood and nothing further is needed.

## 2017-11-29 ENCOUNTER — Telehealth: Payer: Self-pay | Admitting: Internal Medicine

## 2017-11-29 ENCOUNTER — Ambulatory Visit (INDEPENDENT_AMBULATORY_CARE_PROVIDER_SITE_OTHER)
Admission: RE | Admit: 2017-11-29 | Discharge: 2017-11-29 | Disposition: A | Discharge status: Home / Self Care | Payer: BLUE CROSS/BLUE SHIELD | Source: Ambulatory Visit | Attending: Internal Medicine | Admitting: Internal Medicine

## 2017-11-29 DIAGNOSIS — R918 Other nonspecific abnormal finding of lung field: Secondary | ICD-10-CM

## 2017-11-29 NOTE — Telephone Encounter (Signed)
Located the letter up front. Pt's insurance will not be covering [name] this year. Covered alternatives are Incruse and Spiriva.  Dr. Maple HudsonYoung - please advise. Thanks.

## 2017-11-29 NOTE — Telephone Encounter (Signed)
Spoke with pt and advised will await CY response. Pt understood.

## 2017-11-29 NOTE — Telephone Encounter (Signed)
Pt is calling back [phone]

## 2017-11-30 MED ORDER — TIOTROPIUM BROMIDE MONOHYDRATE 2.5 MCG/ACT IN AERS: 2.0000 | INHALATION_SPRAY | Freq: Every day | RESPIRATORY_TRACT | 5 refills | Status: DC

## 2017-11-30 NOTE — Telephone Encounter (Signed)
Per CY-change Tudorza to Spiriva Respimat #1 inhale 2 puffs once daily with 11 refills. Thanks.

## 2017-11-30 NOTE — Telephone Encounter (Signed)
Spoke with pt. He is aware of the medication change. Rx has been sent in. Nothing further was needed. 

## 2018-01-10 ENCOUNTER — Encounter: Payer: Self-pay | Admitting: Internal Medicine

## 2018-01-10 ENCOUNTER — Ambulatory Visit: Payer: BLUE CROSS/BLUE SHIELD | Admitting: Internal Medicine

## 2018-01-10 VITALS — BP 138/80 | HR 98 | Ht 74.0 in | Wt 209.6 lb

## 2018-01-10 DIAGNOSIS — R911 Solitary pulmonary nodule: Secondary | ICD-10-CM | POA: Diagnosis not present

## 2018-01-10 DIAGNOSIS — J449 Chronic obstructive pulmonary disease, unspecified: Secondary | ICD-10-CM | POA: Diagnosis not present

## 2018-01-10 DIAGNOSIS — R918 Other nonspecific abnormal finding of lung field: Secondary | ICD-10-CM

## 2018-01-10 DIAGNOSIS — Z23 Encounter for immunization: Secondary | ICD-10-CM

## 2018-01-10 NOTE — Assessment & Plan Note (Signed)
Clinically stable without exacerbation.  We reviewed medications without change. Plan-Pneumovax with discussion

## 2018-01-10 NOTE — Assessment & Plan Note (Signed)
Nonspecific nodules have been stable but he is high risk based on smoking history and asbestos exposure. Plan-update CT chest

## 2018-01-10 NOTE — Progress Notes (Signed)
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 Despite repetitive coaching he did not generate acceptable curves.  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  We discussed potential rental from [place]. 1 month exacerbation.  Prednisone taper helps some.  Some scant green sputum.  Walks miles daily for work and also does some exercise.  He is not sure that Brio helps at all.  [employer] does not help as much as it used to. Walk Test 11/09/17-O2 saturation nadir 92% CXR 11/24/16-  IMPRESSION: No acute abnormality noted.  01/10/2018- 62 year old male former smoker followed for COPD, history asbestos exposure/lung nodules, food (meat) allergy,  macular degeneration Breo 200, albuterol hfa, Tudorza,  Breo 200,  Spiriva, albuterol hfa, neb Duoneb,  Likes Spiriva better than [place] (insurance changed) Much less cough now, occasionally productive of white sputum.  Thinks he is breathing better.  Dyspnea on exertion is not worse. ENT gave amoxicillin for sinus infection currently. Discussed lung nodules. CXR 3/14/ 19 IMPRESSION: COPD without evidence of residual pneumonia Nodular densities throughout the left lung. These were present on a prior chest CT of  07/29/2015. Given history of smoking, follow-up chest CT is recommended for stability.  ROS-see HPI + = positive Constitutional:   No-   weight loss, night sweats, fevers, chills, fatigue, lassitude. HEENT:   No-  headaches, difficulty swallowing, tooth/dental problems, sore throat,       No-  sneezing, itching, ear ache, nasal congestion, post nasal drip,  CV:  No-   chest pain, orthopnea, PND, swelling in lower extremities, anasarca,                                                 dizziness, palpitations Resp: +  shortness of breath with exertion or at rest.             +   productive cough,  + non-productive cough,  No- coughing up of blood.          change in color of mucus.  No- wheezing.  +snores Skin: No-   rash or lesions. GI:  No-   heartburn, indigestion, abdominal pain, nausea, vomiting,  GU:  MS:  No-   joint pain or swelling.  . Neuro-     nothing unusual Psych:  No- change in mood or affect. No depression or anxiety.  No memory loss.  OBJ- Physical Exam General- Alert, Oriented, Affect-appropriate, Distress- none acute Skin- +facial rash c/w rosacea Lymphadenopathy- none Head- atraumatic            Eyes- Gross vision intact, PERRLA, conjunctivae and secretions clear            Ears- Hearing, canals-normal            Nose- Clear, no-Septal dev, mucus, polyps, erosion, perforation  Throat- Mallampati III-IV , mucosa clear , drainage- none, tonsils- atrophic Neck- flexible , trachea midline, no stridor , thyroid nl, carotid no bruit Chest - symmetrical excursion , unlabored           Heart/CV- RRR , no murmur , no gallop  , no rub, nl s1 s2                           - JVD- none , edema- none, stasis changes- none, varices- none           Lung- +clear but distant, wheeze- none, cough + dry rattle, dullness-none, rub- none           Chest wall-  Abd- tender-no, distended-no, bowel sounds-present, HSM- no Br/ Gen/ Rectal- Not done, not indicated Extrem- cyanosis-  none, clubbing, none, atrophy- none, strength- nl,             +Nails bitten short Neuro-+ tremor hands

## 2018-01-10 NOTE — Patient Instructions (Addendum)
Order-  Schedule CT chest- no contrast      Dx lung nodules, asbestos exposure  Order- Pneumovax- 23 strain  Please call as needed

## 2018-01-17 ENCOUNTER — Other Ambulatory Visit: Payer: Self-pay | Admitting: Internal Medicine

## 2018-01-23 ENCOUNTER — Ambulatory Visit (INDEPENDENT_AMBULATORY_CARE_PROVIDER_SITE_OTHER)
Admission: RE | Admit: 2018-01-23 | Discharge: 2018-01-23 | Disposition: A | Discharge status: Home / Self Care | Payer: BLUE CROSS/BLUE SHIELD | Source: Ambulatory Visit | Attending: Internal Medicine | Admitting: Internal Medicine

## 2018-01-23 DIAGNOSIS — R911 Solitary pulmonary nodule: Secondary | ICD-10-CM

## 2018-01-24 ENCOUNTER — Telehealth: Payer: Self-pay | Admitting: Internal Medicine

## 2018-01-24 DIAGNOSIS — R918 Other nonspecific abnormal finding of lung field: Secondary | ICD-10-CM

## 2018-01-24 NOTE — Telephone Encounter (Signed)
Notes recorded by Waymon Budge, MD on 01/23/2018 at 7:31 PM EDT CT chest- some lung nodules are gone, while new ones have appeared. This is consistent with an inflammatory process. Radiologist suggests repeat CT in 6 months to check on these.   Recommend order future CT chest, no contrast, in 6 months, for dx multiple lung nodules. -------------------------------------- Attempted to call the pt. I did not receive an answer. I have left a message for pt to return our call.

## 2018-01-25 NOTE — Telephone Encounter (Signed)
Spoke with pt. He is aware of results. Order has been placed for repeat CT in 6 months. Nothing further was needed.

## 2018-03-12 ENCOUNTER — Ambulatory Visit: Payer: BLUE CROSS/BLUE SHIELD | Admitting: Internal Medicine

## 2018-03-21 ENCOUNTER — Ambulatory Visit: Payer: BLUE CROSS/BLUE SHIELD | Admitting: Internal Medicine

## 2018-04-04 ENCOUNTER — Ambulatory Visit: Payer: BLUE CROSS/BLUE SHIELD | Admitting: Internal Medicine

## 2018-04-10 ENCOUNTER — Encounter: Payer: Self-pay | Admitting: Internal Medicine

## 2018-04-10 ENCOUNTER — Ambulatory Visit: Payer: BLUE CROSS/BLUE SHIELD | Admitting: Internal Medicine

## 2018-04-10 VITALS — BP 146/88 | HR 87 | Ht 74.0 in | Wt 210.2 lb

## 2018-04-10 DIAGNOSIS — R918 Other nonspecific abnormal finding of lung field: Secondary | ICD-10-CM | POA: Diagnosis not present

## 2018-04-10 DIAGNOSIS — R911 Solitary pulmonary nodule: Secondary | ICD-10-CM

## 2018-04-10 DIAGNOSIS — J449 Chronic obstructive pulmonary disease, unspecified: Secondary | ICD-10-CM | POA: Diagnosis not present

## 2018-04-10 MED ORDER — PREDNISONE 10 MG PO TABS: ORAL_TABLET | ORAL | 0 refills | Status: DC

## 2018-04-10 NOTE — Progress Notes (Signed)
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 Despite repetitive coaching he did not generate acceptable curves.  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  Thinks he is breathing better.  Dyspnea on exertion is not worse. ENT gave amoxicillin for sinus infection currently. Discussed lung nodules. CXR 3/14/ 19 IMPRESSION: COPD without evidence of residual pneumonia Nodular densities throughout the left lung. These were present on a prior chest CT of 07/29/2015. Given history of smoking, follow-up chest CT is recommended for stability.  04/10/2018- 62 year old male former smoker followed for COPD, history asbestos exposure/lung nodules, food (meat) allergy,  macular degeneration Breo 200, Spiriva Respimat, albuterol HFA, DuoNeb -----COPD: Pt states last week he started having SOB and wheezing and usually has to take prednisone-would like to see if its needed.  Had done well until the last 3 days when he began noting more cough and shortness of breath with chest tightness.  He has been very consistent with his regular meds and prefers [place]iriva over [place].  Seems hot weather.  Wears a respirator mask at work. We reviewed his CT with report of small nodules and evidence  of bronchiolitis. CT chest 01/23/2018 IMPRESSION: 1. Previously described peribronchovascular nodularity in the left lung on 07/29/2015 chest CT study is absent on today's scan, compatible with resolved inflammatory nodularity. 2. New mild patchy tree-in-bud opacities throughout the left lung compatible with nonspecific mild infectious or inflammatory bronchiolitis. 3. A few new scattered solid pulmonary nodules in both lungs, largest 6 mm in the right middle lobe. Non-contrast chest CT at 3-6 months is recommended. If the nodules are stable at time of repeat CT, then future CT at 18-24 months (from today's scan) is considered optional for low-risk patients, but is recommended for high-risk patients. This recommendation follows the consensus statement: Guidelines for Management of Incidental Pulmonary Nodules Detected on CT Images: From the Fleischner Society 2017; Radiology 2017; 284:228-243. 4. No evidence of interstitial lung disease. 5. Moderate centrilobular emphysema, saber sheath trachea and diffuse bronchial wall thickening, suggesting COPD. 6. One vessel coronary atherosclerosis. 7. Diffuse hepatic steatosis.  Emphysema (ICD10-J43.9).   ROS-see HPI + = positive Constitutional:   No-   weight loss, night sweats, fevers, chills, fatigue, lassitude. HEENT:   No-  headaches, difficulty swallowing, tooth/dental problems, sore throat,       No-  sneezing, itching, ear ache, nasal congestion, post nasal drip,  CV:  No-   chest pain, orthopnea, PND, swelling in lower extremities, anasarca,                                                 dizziness, palpitations Resp: +  shortness of breath with exertion or at rest.             +   productive cough,  + non-productive cough,  No- coughing up of  blood.          change in color of mucus.  No- wheezing.  +snores Skin: No-   rash or lesions. GI:  No-   heartburn, indigestion, abdominal pain, nausea, vomiting,  GU:  MS:  No-   joint  pain or swelling.  . Neuro-     nothing unusual Psych:  No- change in mood or affect. No depression or anxiety.  No memory loss.  OBJ- Physical Exam General- Alert, Oriented, Affect-appropriate, Distress- none acute Skin- +facial rash c/w rosacea Lymphadenopathy- none Head- atraumatic            Eyes- Gross vision intact, PERRLA, conjunctivae and secretions clear            Ears- Hearing, canals-normal            Nose- Clear, no-Septal dev, mucus, polyps, erosion, perforation             Throat- Mallampati III-IV , mucosa clear , drainage- none, tonsils- atrophic Neck- flexible , trachea midline, no stridor , thyroid nl, carotid no bruit Chest - symmetrical excursion , unlabored           Heart/CV- RRR , no murmur , no gallop  , no rub, nl s1 s2                           - JVD- none , edema- none, stasis changes- none, varices- none           Lung- +clear but distant, wheeze- none, cough + dry rattle, dullness-none, rub- none           Chest wall-  Abd- tender-no, distended-no, bowel sounds-present, HSM- no Br/ Gen/ Rectal- Not done, not indicated Extrem- cyanosis- none, clubbing, none, atrophy- none, strength- nl,             +Nails bitten short Neuro-+ tremor hands

## 2018-04-10 NOTE — Assessment & Plan Note (Signed)
CT shows emphysema and bronchitis changes, and also small nodules and bronchiolitis which may be inflammatory.  He does wear a respirator at work. Plan-prednisone 8-day taper with steroid talk.

## 2018-04-10 NOTE — Patient Instructions (Addendum)
Script sent for prednisone   Order- schedule future CT chest no contrast in 6 months   Dx lung nodules 6 mm   Please call if we can help

## 2018-04-23 ENCOUNTER — Other Ambulatory Visit: Payer: Self-pay | Admitting: Internal Medicine

## 2018-05-09 ENCOUNTER — Other Ambulatory Visit: Payer: Self-pay | Admitting: Internal Medicine

## 2018-06-21 ENCOUNTER — Other Ambulatory Visit: Payer: Self-pay | Admitting: Internal Medicine

## 2018-07-13 ENCOUNTER — Ambulatory Visit: Payer: BLUE CROSS/BLUE SHIELD | Admitting: Internal Medicine

## 2018-07-17 ENCOUNTER — Telehealth: Payer: Self-pay | Admitting: Internal Medicine

## 2018-07-17 MED ORDER — AZITHROMYCIN 250 MG PO TABS: ORAL_TABLET | ORAL | 0 refills | Status: DC

## 2018-07-17 MED ORDER — BENZONATATE 200 MG PO CAPS: 200.0000 mg | ORAL_CAPSULE | Freq: Three times a day (TID) | ORAL | 0 refills | Status: DC | PRN

## 2018-07-17 NOTE — Telephone Encounter (Signed)
Suggest Zpak    250 mg, # 6, 2 today then one daily                Mucinex DM otc may help with cough and congestion                Ok to send benzonatate perles 200 mg, # 40, 1 every 8 hours if needed for cough  The pain med from his orthopedic surgeon will help with pain and with cough

## 2018-07-17 NOTE — Telephone Encounter (Signed)
Called pt and advised message from the provider. Pt understood and verbalized understanding. Nothing further is needed.   Rx's sent into his pharmacy.

## 2018-07-17 NOTE — Telephone Encounter (Signed)
Called and spoke with pt who states he has been coughing x1 week but states he also fell about a week ago.  Pt stated normally he is able to cough up the mucus but stated when he fell, he broke his collar bone and also cracked his ribs.  Pt had orthopedic surgery today but was told that he should get the cough and congestion taken care of.  Pt stated when he is able to cough up some mucus, he states it is green in color but it takes a while for him to be able to get it up and when he does get it up, he is in a lot of pain due to the breaks he sustained and due to just having surgery.  Pt wants something to be prescribed to help with his symptoms.  Dr. Maple Hudson, please advise on this for pt.  No Known Allergies   Current Outpatient Medications:  .  Aflibercept 2 MG/0.05ML SOLN, Inject into the eye. Injection every 5 weeks, Disp: , Rfl:  .  BREO ELLIPTA 200-25 MCG/INH AEPB, INHALE 1 PUFF INTO LUNGS DAILY.  RINSE MOUTH, Disp: 60 each, Rfl: 5 .  diclofenac (VOLTAREN) 75 MG EC tablet, Take 1 tablet (75 mg total) by mouth 2 (two) times daily. In the morning and in the afternoon, Disp: 30 tablet, Rfl: 0 .  ibuprofen (ADVIL,MOTRIN) 200 MG tablet, Take 800 mg by mouth every 6 (six) hours as needed for moderate pain. , Disp: , Rfl:  .  ipratropium-albuterol (DUONEB) 0.5-2.5 (3) MG/3ML SOLN, Take 3 mLs by nebulization every 6 (six) hours as needed., Disp: 75 mL, Rfl: 12 .  lisinopril (PRINIVIL,ZESTRIL) 20 MG tablet, Take 20 mg by mouth every morning., Disp: , Rfl:  .  Multiple Vitamins-Minerals (MEGA MULTI MEN PO), Take 1 tablet by mouth daily., Disp: , Rfl:  .  pantoprazole (PROTONIX) 40 MG tablet, Take 40 mg by mouth every morning., Disp: , Rfl:  .  predniSONE (DELTASONE) 10 MG tablet, 4 X 2 DAYS, 3 X 2 DAYS, 2 X 2 DAYS, 1 X 2 DAYS, Disp: 20 tablet, Rfl: 0 .  PROAIR HFA 108 (90 Base) MCG/ACT inhaler, INHALE 2 PUFFS INTO THE LUNGS EVERY 6 HOURS AS NEEDED FOR WHEEZING ORSHORTNESS OF BREATH., Disp: 8.5 g,  Rfl: 5 .  SPIRIVA RESPIMAT 2.5 MCG/ACT AERS, INHALE 2 PUFFS INTO THE LUNGS DAILY, Disp: 4 g, Rfl: 5  Current Facility-Administered Medications:  .  triamcinolone acetonide (KENALOG) 10 MG/ML injection 10 mg, 10 mg, Other, Once, [place]al, Kirstie Peri, DPM

## 2018-10-11 ENCOUNTER — Ambulatory Visit (INDEPENDENT_AMBULATORY_CARE_PROVIDER_SITE_OTHER)
Admission: RE | Admit: 2018-10-11 | Discharge: 2018-10-11 | Disposition: A | Discharge status: Home / Self Care | Payer: BLUE CROSS/BLUE SHIELD | Source: Ambulatory Visit | Attending: Internal Medicine | Admitting: Internal Medicine

## 2018-10-11 DIAGNOSIS — R911 Solitary pulmonary nodule: Secondary | ICD-10-CM | POA: Diagnosis not present

## 2018-10-15 ENCOUNTER — Ambulatory Visit: Payer: BLUE CROSS/BLUE SHIELD | Admitting: Internal Medicine

## 2018-10-15 ENCOUNTER — Encounter: Payer: Self-pay | Admitting: Internal Medicine

## 2018-10-15 VITALS — BP 142/88 | HR 92 | Ht 74.0 in | Wt 212.6 lb

## 2018-10-15 DIAGNOSIS — J449 Chronic obstructive pulmonary disease, unspecified: Secondary | ICD-10-CM

## 2018-10-15 DIAGNOSIS — R918 Other nonspecific abnormal finding of lung field: Secondary | ICD-10-CM

## 2018-10-15 MED ORDER — FLUTICASONE-UMECLIDIN-VILANT 100-62.5-25 MCG/INH IN AEPB: 1.0000 | INHALATION_SPRAY | Freq: Every day | RESPIRATORY_TRACT | 12 refills | Status: DC

## 2018-10-15 NOTE — Patient Instructions (Signed)
Sample and printed script for Trelegy inhaler   Inhale 1 puff, then rinse mouth, once daily. Try this instead of Breo/ Spiriva.   Order- schedule future CT chest no contrast in one year     Dx bilateral lung nodules  Please call if we can help

## 2018-10-15 NOTE — Assessment & Plan Note (Signed)
CT does demonstrate changes consistent with bronchitis and emphysema. Plan-try consolidating Breo and Spiriva into a Trelegy inhaler, which should offer lower total co-pay for him.

## 2018-10-15 NOTE — Assessment & Plan Note (Signed)
Nodules have looked inflammatory with shifting areas of involvement.  Fortunately so far no specific concerning lesion identified. Plan-follow-up chest CT in 1 year

## 2018-10-15 NOTE — Progress Notes (Signed)
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 Despite repetitive coaching he did not generate acceptable curves.  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 04/10/2018- 63 year old male former smoker followed for COPD, history asbestos exposure/lung nodules, food (meat) allergy,  macular degeneration Breo 200, Spiriva Respimat, albuterol HFA, DuoNeb -----COPD: Pt states last week he started having SOB and wheezing and usually has to take prednisone-would like to see if its needed.  Had done well until the last 3 days when he began noting more cough and shortness of breath with chest tightness.  He has been very consistent with his regular meds and prefers Spiriva over [place].  Seems hot weather.  Wears a respirator mask at work. We reviewed his CT with report of small nodules and evidence of bronchiolitis. CT chest 01/23/2018 IMPRESSION: 1. Previously described peribronchovascular nodularity in the left lung on 07/29/2015 chest CT study is absent on today's scan, compatible with resolved inflammatory nodularity. 2. New mild patchy tree-in-bud opacities throughout the left lung compatible with nonspecific mild infectious or inflammatory bronchiolitis. 3. A few new scattered solid pulmonary nodules in both lungs, largest 6 mm in the right middle lobe. Non-contrast chest CT at 3-6 months is recommended. If the nodules are stable at time of repeat CT, then future CT at 18-24 months (from today's scan) is considered optional for low-risk patients, but is recommended for high-risk patients. This  recommendation follows the consensus statement: Guidelines for Management of Incidental Pulmonary Nodules Detected on CT Images: From the Fleischner Society 2017; Radiology 2017; 284:228-243. 4. No evidence of interstitial lung disease. 5. Moderate centrilobular emphysema, saber sheath trachea and diffuse bronchial wall thickening, suggesting COPD. 6. One vessel coronary atherosclerosis. 7. Diffuse hepatic steatosis. Emphysema (ICD10-J43.9).  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  Fortunately breathing has been stable.  He expects an episodic acute bronchitis every 4 to 5 months, often requiring prednisone.  Most recent one he was able to interrupt with home nebulizer machine use and he feels stable at baseline now.  Denies discolored sputum, blood, adenopathy, fever. Discussed trying to combine his bronchodilators into a single Trelegy device. CT results reviewed with him. CT chest 10/11/2018- IMPRESSION: 1. No suspicious pulmonary nodules or masses are noted on today's examination. 2. There continues to be a few scattered 2-4 mm pulmonary nodules in the periphery of the lungs bilaterally. These are nonspecific, but strongly favored to reflect benign areas of mucoid impaction within terminal bronchioles. No follow-up needed if patient is low-risk (and has no known or suspected primary neoplasm). Non-contrast chest CT can be considered in 12 months if patient is high-risk. This recommendation follows the consensus statement: Guidelines for Management of Incidental Pulmonary Nodules Detected on CT Images: From the Fleischner Society 2017; Radiology 2017;  284:228-243. 3. Aortic atherosclerosis, in addition to left anterior descending coronary artery disease. Please note that although the presence of coronary artery calcium documents the presence of coronary artery disease, the severity of this disease and any potential stenosis cannot be  assessed on this non-gated CT examination. Assessment for potential risk factor modification, dietary therapy or pharmacologic therapy may be warranted, if clinically indicated. 4. Mild diffuse bronchial wall thickening with mild centrilobular and paraseptal emphysema; imaging findings suggestive of underlying COPD. 5. Hepatic steatosis. Aortic Atherosclerosis (ICD10-I70.0) and Emphysema (ICD10-J43.9).  ROS-see HPI + = positive Constitutional:   No-   weight loss, night sweats, fevers, chills, fatigue, lassitude. HEENT:   No-  headaches, difficulty swallowing, tooth/dental problems, sore throat,       No-  sneezing, itching, ear ache, nasal congestion, post nasal drip,  CV:  No-   chest pain, orthopnea, PND, swelling in lower extremities, anasarca,                                                 dizziness, palpitations Resp: +  shortness of breath with exertion or at rest.               productive cough,  + non-productive cough,  No- coughing up of blood.          change in color of mucus.  No- wheezing.  +snores Skin: No-   rash or lesions. GI:  No-   heartburn, indigestion, abdominal pain, nausea, vomiting,  GU:  MS:  No-   joint pain or swelling.  . Neuro-     nothing unusual Psych:  No- change in mood or affect. No depression or anxiety.  No memory loss.  OBJ- Physical Exam General- Alert, Oriented, Affect-appropriate, Distress- none acute Skin- +facial rash c/w rosacea Lymphadenopathy- none Head- atraumatic            Eyes- Gross vision intact, PERRLA, conjunctivae and secretions clear            Ears- Hearing, canals-normal            Nose- Clear, no-Septal dev, mucus, polyps, erosion,  perforation             Throat- Mallampati III-IV , mucosa clear , drainage- none, tonsils- atrophic Neck- flexible , trachea midline, no stridor , thyroid nl, carotid no bruit Chest - symmetrical excursion , unlabored           Heart/CV- RRR , no murmur , no gallop  , no rub, nl s1 s2                           - JVD- none , edema- none, stasis changes- none, varices- none           Lung- +clear but distant, wheeze- none, cough- none, dullness-none, rub- none           Chest wall-  Abd- tender-no, distended-no, bowel sounds-present, HSM- no Br/ Gen/ Rectal- Not done, not indicated Extrem- cyanosis- none, clubbing, none, atrophy- none, strength- nl,             +Nails bitten short Neuro-+ tremor hands

## 2018-11-16 ENCOUNTER — Other Ambulatory Visit: Payer: Self-pay | Admitting: Internal Medicine

## 2018-11-16 ENCOUNTER — Telehealth: Payer: Self-pay | Admitting: Internal Medicine

## 2018-11-16 NOTE — Telephone Encounter (Signed)
Called and left message for Patient to call back about Breo refill.  Per last OV 10/15/18, with Dr Maple Hudson,  Sample and printed script for Trelegy inhaler   Inhale 1 puff, then rinse mouth, once daily. Try this instead of Breo/ Spiriva.

## 2018-11-19 NOTE — Telephone Encounter (Signed)
ATC Patient.  Left message for Patient to call back when available. 

## 2018-11-20 NOTE — Telephone Encounter (Signed)
[phone].- Son says this is the patient new phone number I have lmom to call back.

## 2018-11-21 NOTE — Telephone Encounter (Signed)
ATC, NA and no VM available- closing msg per protocol

## 2018-12-12 ENCOUNTER — Telehealth: Payer: Self-pay | Admitting: Internal Medicine

## 2018-12-12 NOTE — Telephone Encounter (Signed)
Medication name and strength: Trelegy Ellipta Inhaler Provider: CY Pharmacy: [facility] Patient insurance ID: [id number]  Was the PA started on CMM?  Today 12/12/18 If yes, please enter the Key: [id number] Timeframe for approval/denial: 5-7 business days for determination

## 2018-12-14 ENCOUNTER — Telehealth: Payer: Self-pay | Admitting: Internal Medicine

## 2018-12-14 MED ORDER — PREDNISONE 10 MG PO TABS: ORAL_TABLET | ORAL | 0 refills | Status: DC

## 2018-12-14 MED ORDER — AZITHROMYCIN 250 MG PO TABS: ORAL_TABLET | ORAL | 0 refills | Status: DC

## 2018-12-14 NOTE — Telephone Encounter (Signed)
Primary Pulmonologist: Dr. Maple Hudson Last office visit and with whom: 10/15/2018 with Dr. Maple Hudson What do we see them for (pulmonary problems): COPD  Reason for call: Increased shortness of breath, wheezing, coughing. Onset was 3 days ago. Denies chest tightness, fever/chills/sweats, sick contacts or recent travel.  In the last month, have you been in contact with someone who was confirmed or suspected to have Conoravirus / COVID-19?  No Have you traveled internationally or to an area with more than 100 reported cases of Coronavirus / COVID-19?  No  Do you have any of the following symptoms developed in the last 30 days? Fever: No Cough: Yes Shortness of breath: Yes  When did your symptoms start?  3 days ago  If the patient has a fever, what is the last reading?  (use n/a if patient denies fever)  N/A . IF THE PATIENT STATES THEY DO NOT OWN A THERMOMETER, THEY MUST GO AND PURCHASE ONE When did the fever start?: N/A Have you taken any medication to suppress a fever (ie Ibuprofen, Aleve, Tylenol)?: N/A

## 2018-12-14 NOTE — Telephone Encounter (Addendum)
Tried to call patient regarding CY recommendations below, VM is full could not leave VM. Will try to call again later today When placing this zpak order use dx code J44.9 (COPD) per CY

## 2018-12-14 NOTE — Telephone Encounter (Signed)
Most likely this is an exacerbation of his known COPD  Offer prednisone 10 mg, # 20, 4 X 2 DAYS, 3 X 2 DAYS, 2 X 2 DAYS, 1 X 2 DAYS           Zpak to hold in case antibiotic needed  250 mg, # 6, 2 on day 1, then 1 daily  If develops fever, sore throat, unusual shortness of breath, muscle aches  Then go to ER

## 2018-12-14 NOTE — Telephone Encounter (Signed)
Spoke with pt and advised of Dr Roxy Cedar recommendations.  PT verbalized understanding.  Rx sent to pharmacy. Nothing further needed at this time.

## 2019-01-03 NOTE — Telephone Encounter (Signed)
Checked CMM and key # [id number] which is not showing up as "no matching record". Message sent to [facility] to check on result/determination.  Response from CMM: "[place] F.: It looks like this was successfully sent to the plan. The plan did come back stating this medication was on the drug list and therefore a PA was not required!"  Nothing further needed at this time.

## 2019-02-08 ENCOUNTER — Encounter (HOSPITAL_COMMUNITY): Payer: Self-pay

## 2019-02-08 ENCOUNTER — Ambulatory Visit (HOSPITAL_COMMUNITY)
Admission: EM | Admit: 2019-02-08 | Discharge: 2019-02-08 | Disposition: A | Discharge status: Home / Self Care | Payer: Commercial Managed Care - PPO | Attending: Emergency Medicine | Admitting: Emergency Medicine

## 2019-02-08 ENCOUNTER — Other Ambulatory Visit: Payer: Self-pay

## 2019-02-08 DIAGNOSIS — L0291 Cutaneous abscess, unspecified: Secondary | ICD-10-CM

## 2019-02-08 DIAGNOSIS — L02415 Cutaneous abscess of right lower limb: Secondary | ICD-10-CM | POA: Diagnosis not present

## 2019-02-08 MED ORDER — CEPHALEXIN 500 MG PO CAPS: 500.0000 mg | ORAL_CAPSULE | Freq: Three times a day (TID) | ORAL | 0 refills | Status: AC

## 2019-02-08 MED ORDER — LIDOCAINE HCL 2 % IJ SOLN
INTRAMUSCULAR | Status: AC
  Filled 2019-02-08: qty 20

## 2019-02-08 NOTE — ED Triage Notes (Signed)
Pt presents with abscess on backside of right leg X 3 days.

## 2019-02-08 NOTE — Discharge Instructions (Signed)
Cleanse daily with soap and water. Keep covered to keep clean.  Complete course of antibiotics.  If symptoms worsen or do not improve in the next week to return to be seen or to follow up with your PCP.

## 2019-02-08 NOTE — ED Provider Notes (Signed)
[facility]    CSN: [id number] Arrival date & time: 02/08/19  4174     History   Chief Complaint Chief Complaint  Patient presents with  . Abscess    HPI [name] is a 63 y.o. male.   [name] presents with complaints of abscess to posterior right thigh. Noted it three days ago and has increased in pain and size. His girl friend tried to drain it last night with a pin and only has small bloody drainage. No fevers. No known MRSA. States has had similar in the past and has needed I&D. No fever. No DM. Hx of HTN, GERD, COPD.     ROS per HPI, negative if not otherwise mentioned.      Past Medical History:  Diagnosis Date  . COPD (chronic obstructive pulmonary disease) (HCC)   . GERD (gastroesophageal reflux disease)   . Hypertension     Patient Active Problem List   Diagnosis Date Noted  . 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 Lung nodules 10/28/2015  . 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 Chest x-ray abnormality 10/01/2014    Past Surgical History:  Procedure Laterality Date  . CATARACT EXTRACTION, BILATERAL    . EYE SURGERY Bilateral   . ORTHOPEDIC SURGERY Bilateral    pt states fracture repairs to arms and legs.       Home Medications    Prior to Admission medications   Medication Sig Start Date End Date Taking? Authorizing Provider  Aflibercept 2 MG/0.05ML SOLN Inject into the eye. Injection every 5 weeks    [provider]  BREO ELLIPTA 200-25 MCG/INH AEPB INHALE 1 PUFF INTO LUNGS DAILY.  RINSE MOUTH 11/21/18   Young, Joni Fears D, MD  cephALEXin (KEFLEX) 500 MG capsule Take 1 capsule (500 mg total) by mouth 3 (three) times daily for 10 days. 02/08/19 02/18/19  Georgetta Haber, NP  Fluticasone-Umeclidin-Vilant (TRELEGY ELLIPTA) 100-62.5-25 MCG/INH AEPB Inhale 1 puff into the lungs daily. 10/15/18   Jetty Duhamel D, MD  ibuprofen (ADVIL,MOTRIN) 200 MG tablet Take 800 mg by mouth every 6 (six) hours as needed for  moderate pain.     [provider]  ipratropium-albuterol (DUONEB) 0.5-2.5 (3) MG/3ML SOLN Take 3 mLs by nebulization every 6 (six) hours as needed. 11/09/17   Jetty Duhamel D, MD  lisinopril (PRINIVIL,ZESTRIL) 20 MG tablet Take 20 mg by mouth every morning.    [provider]  Multiple Vitamins-Minerals (MEGA MULTI MEN PO) Take 1 tablet by mouth daily.    [provider]  pantoprazole (PROTONIX) 40 MG tablet Take 40 mg by mouth every morning.    [provider]  predniSONE (DELTASONE) 10 MG tablet Take 4 tablets a days x 2 days, 3 tablets a day x 2 days, 2 tablets a day x 2 days, 1 tablet a day x 2 days then stop. 12/14/18   Waymon Budge, MD  PROAIR HFA 108 [phone] Base) MCG/ACT inhaler INHALE 2 PUFFS INTO THE LUNGS EVERY 6 HOURS AS NEEDED FOR WHEEZING ORSHORTNESS OF BREATH. 05/09/18   Waymon Budge, MD  SPIRIVA RESPIMAT 2.5 MCG/ACT AERS INHALE 2 PUFFS INTO THE LUNGS DAILY 06/21/18   Waymon Budge, MD    Family History Family History  Problem Relation Age of Onset  . Heart disease Mother   . Hypertension Father     Social History Social History   Tobacco Use  . Smoking status: Former Smoker  Packs/day: 1.00    Years: 35.00    Pack years: 35.00    Types: Cigarettes    Last attempt to quit: 10/01/2012    Years since quitting: 6.3  . Smokeless tobacco: Never Used  Substance Use Topics  . Alcohol use: Yes    Alcohol/week: 5.0 standard drinks    Types: 5 Cans of beer per week    Comment: daily  . Drug use: No     Allergies   Patient has no known allergies.   Review of Systems Review of Systems   Physical Exam Triage Vital Signs ED Triage Vitals  Enc Vitals Group     BP 02/08/19 0830 114/90     Pulse Rate 02/08/19 0830 93     Resp 02/08/19 0830 17     Temp 02/08/19 0830 98 F (36.7 C)     Temp Source 02/08/19 0830 Oral     SpO2 02/08/19 0830 97 %     Weight --      Height --      Head Circumference --      Peak Flow --       Pain Score 02/08/19 0832 5     Pain Loc --      Pain Edu? --      Excl. in GC? --    No data found.  Updated Vital Signs BP 114/90 (BP Location: Left Arm)   Pulse 93   Temp 98 F (36.7 C) (Oral)   Resp 17   SpO2 97%   Visual Acuity Right Eye Distance:   Left Eye Distance:   Bilateral Distance:    Right Eye Near:   Left Eye Near:    Bilateral Near:     Physical Exam Constitutional:      Appearance: He is well-developed.  Cardiovascular:     Rate and Rhythm: Normal rate and regular rhythm.  Pulmonary:     Effort: Pulmonary effort is normal.     Breath sounds: Normal breath sounds.  Skin:    General: Skin is warm and dry.          Comments: Approximately 2cm in circumference fluctuant abscess with surrounding redness, tenderness; some open/abrased skin to proximal aspect of abscess; no active drainage or bleeding  Neurological:     Mental Status: He is alert and oriented to person, place, and time.      UC Treatments / Results  Labs (all labs ordered are listed, but only abnormal results are displayed) Labs Reviewed - No data to display  EKG None  Radiology No results found.  Procedures Incision and Drainage Date/Time: 02/08/2019 8:59 AM Performed by: Georgetta Haber, NP Authorized by: Georgetta Haber, NP   Consent:    Consent obtained:  Verbal   Consent given by:  Patient   Risks discussed:  Bleeding, incomplete drainage, pain, infection and damage to other organs   Alternatives discussed:  No treatment, delayed treatment, alternative treatment, observation and referral Location:    Type:  Abscess   Size:  2   Location:  Lower extremity   Lower extremity location:  Leg   Leg location:  R upper leg Pre-procedure details:    Skin preparation:  Betadine Anesthesia (see MAR for exact dosages):    Anesthesia method:  Local infiltration   Local anesthetic:  Lidocaine 2% w/o epi Procedure type:    Complexity:  Simple Procedure details:     Incision types:  Single straight (two incisions completed due to  inadequate drainage from initial incision with increased output from second incision )   Incision depth:  Dermal   Scalpel blade:  11   Wound management:  Probed and deloculated   Drainage:  Purulent and bloody   Drainage amount:  Moderate   Wound treatment:  Wound left open   Packing materials:  None Post-procedure details:    Patient tolerance of procedure:  Tolerated well, no immediate complications   (including critical care time)  Medications Ordered in UC Medications - No data to display  Initial Impression / Assessment and Plan / UC Course  I have reviewed the triage vital signs and the nursing notes.  Pertinent labs & imaging results that were available during my care of the patient were reviewed by me and considered in my medical decision making (see chart for details).     I&D completed and tolerated, moderate purulent drainage. Keflex initiated. Wound care discussed. Return precautions provided.  Patient verbalized understanding and agreeable to plan.   Final Clinical Impressions(s) / UC Diagnoses   Final diagnoses:  Abscess     Discharge Instructions     Cleanse daily with soap and water. Keep covered to keep clean.  Complete course of antibiotics.  If symptoms worsen or do not improve in the next week to return to be seen or to follow up with your PCP.     ED Prescriptions    Medication Sig Dispense Auth. Provider   cephALEXin (KEFLEX) 500 MG capsule Take 1 capsule (500 mg total) by mouth 3 (three) times daily for 10 days. 30 capsule Georgetta HaberBurky, Kathlean Cinco B, NP     Controlled Substance Prescriptions [place] Controlled Substance Registry consulted? Not Applicable   Georgetta HaberBurky, Gill Delrossi B, NP 02/08/19 [phone]

## 2019-03-01 ENCOUNTER — Other Ambulatory Visit: Payer: Self-pay | Admitting: Internal Medicine

## 2019-04-04 ENCOUNTER — Other Ambulatory Visit: Payer: Self-pay | Admitting: Internal Medicine

## 2019-05-06 ENCOUNTER — Telehealth: Payer: Self-pay | Admitting: Internal Medicine

## 2019-05-06 DIAGNOSIS — R059 Cough, unspecified: Secondary | ICD-10-CM

## 2019-05-06 DIAGNOSIS — R0602 Shortness of breath: Secondary | ICD-10-CM

## 2019-05-06 DIAGNOSIS — R05 Cough: Secondary | ICD-10-CM

## 2019-05-06 MED ORDER — AZITHROMYCIN 250 MG PO TABS: 250.0000 mg | ORAL_TABLET | Freq: Every day | ORAL | 0 refills | Status: DC

## 2019-05-06 MED ORDER — PREDNISONE 10 MG PO TABS: ORAL_TABLET | ORAL | 0 refills | Status: DC

## 2019-05-06 NOTE — Telephone Encounter (Signed)
Offer Zpak 250 mg, # 6, 2 today then one daily. Stay well hydrated If gets worse, go to ER

## 2019-05-06 NOTE — Telephone Encounter (Signed)
Ok prednisone 10 mg, # 20, 4 X 2 DAYS, 3 X 2 DAYS, 2 X 2 DAYS, 1 X 2 DAYS  

## 2019-05-06 NOTE — Telephone Encounter (Signed)
Called and spoke w/ pt regarding CY's recommendations. Pt verbalized understanding and inquired if he could be prescribed prednisone as well to help "clear up his lungs." I let him know I would get a message routed back to [facility] for follow up. Pt expressed understanding.   CY, please advise if you would be ok with prescribing pt prednisone in addition to the [place]. Zpak 250 mg, # 6, 2 today then one daily has been already been placed.

## 2019-05-06 NOTE — Telephone Encounter (Signed)
Called the patient and made him aware the prescription for Prednisone was approved by Dr. Annamaria Boots. Confirmed prescription to be sent to [facility]. Advised the patient that if he is not feeling better after he as completed the medication issued to call our office to let us know.   Prescriptions sent to pharmacy.  Patient voiced understanding. Nothing further needed at this time.

## 2019-05-06 NOTE — Telephone Encounter (Signed)
Called patient. Patient stated that he feels like he has an infection.  Patient reports increased shortness of breath, cough that is happening every few minutes and phlegm that is gray.  Patient denies fever but hasn't taken temperature since 05/03/2019.  Patient denies appointment.  Patient would like something sent in to [facility] prior to him going on vacation.  Dr. Annamaria Boots please advise. Thank you!

## 2019-06-06 ENCOUNTER — Other Ambulatory Visit: Payer: Self-pay | Admitting: Internal Medicine

## 2019-08-06 ENCOUNTER — Telehealth: Payer: Self-pay | Admitting: Nurse Practitioner

## 2019-08-06 NOTE — Telephone Encounter (Signed)
No record of Dr. Glori Bickers ever being PCP in Epic at all, will route to Dr. Glori Bickers to advise

## 2019-08-06 NOTE — Telephone Encounter (Signed)
Helene Kelp (girlfriend) called to make pt a new patient appointment.  She stated pt saw dr tower years ago and wanted to see if dr tower would take him back as new patient  Pt has [employer].

## 2019-08-06 NOTE — Telephone Encounter (Signed)
Not taking new patients currently  Thanks  

## 2019-08-07 NOTE — Telephone Encounter (Signed)
Left message asking teresa to call office

## 2019-08-08 NOTE — Telephone Encounter (Signed)
[name] returned call.  She was notified Dr.Tower isn't taking new patients.  She scheduled new patient appointment with Debbie on 08/09/19.

## 2019-08-09 ENCOUNTER — Encounter: Payer: Self-pay | Admitting: Family Medicine

## 2019-08-09 ENCOUNTER — Other Ambulatory Visit: Payer: Self-pay

## 2019-08-09 ENCOUNTER — Ambulatory Visit (INDEPENDENT_AMBULATORY_CARE_PROVIDER_SITE_OTHER): Payer: Commercial Managed Care - PPO | Admitting: Family Medicine

## 2019-08-09 VITALS — BP 118/74 | HR 95 | Temp 98.1°F | Ht 74.0 in | Wt 223.8 lb

## 2019-08-09 DIAGNOSIS — Z7689 Persons encountering health services in other specified circumstances: Secondary | ICD-10-CM | POA: Diagnosis not present

## 2019-08-09 DIAGNOSIS — Z23 Encounter for immunization: Secondary | ICD-10-CM | POA: Diagnosis not present

## 2019-08-09 DIAGNOSIS — J449 Chronic obstructive pulmonary disease, unspecified: Secondary | ICD-10-CM

## 2019-08-09 DIAGNOSIS — E663 Overweight: Secondary | ICD-10-CM | POA: Diagnosis not present

## 2019-08-09 DIAGNOSIS — Z789 Other specified health status: Secondary | ICD-10-CM

## 2019-08-09 DIAGNOSIS — I10 Essential (primary) hypertension: Secondary | ICD-10-CM

## 2019-08-09 DIAGNOSIS — K219 Gastro-esophageal reflux disease without esophagitis: Secondary | ICD-10-CM

## 2019-08-09 DIAGNOSIS — E785 Hyperlipidemia, unspecified: Secondary | ICD-10-CM

## 2019-08-09 DIAGNOSIS — Z7289 Other problems related to lifestyle: Secondary | ICD-10-CM

## 2019-08-09 DIAGNOSIS — Z125 Encounter for screening for malignant neoplasm of prostate: Secondary | ICD-10-CM

## 2019-08-09 DIAGNOSIS — F1090 Alcohol use, unspecified, uncomplicated: Secondary | ICD-10-CM

## 2019-08-09 MED ORDER — PANTOPRAZOLE SODIUM 20 MG PO TBEC: 20.0000 mg | DELAYED_RELEASE_TABLET | Freq: Every morning | ORAL | 0 refills | Status: DC

## 2019-08-09 NOTE — Patient Instructions (Signed)
Good to meet you today  I will notify you of lab results  Follow up in 6 months for a complete physical exam

## 2019-08-09 NOTE — Progress Notes (Signed)
Subjective:    Patient ID: [name], male    DOB: December 05, 1955, 63 y.o.   MRN: [id number]  HPI This is a 63 yo male who presents today to establish care. Saw Dr. Milinda Antis many years ago, more recently has been seeing Lu Duffel. Works as Special educational needs teacher. Enjoys work. Likes to hunt, fish. Has a long term girlfriend. Daughter is a trial attorney in [place]. Son in armed forces in [place].   Last CPE- not sure PSA- unknown Colonoscopy- Jeff Medoff, 2019 Tdap- unsure, will have today Flu- annual Dental- regular Eye- macular degeneration, regular, getting injections Exercise- some walking, some stretching  Accident last year- fell and broke ribs, tore both rotator cuffs. Left shoulder bursitis.   GERD- pantoprazole daily for years, no symptoms   COPD- regular follow up with pulmonary (Dr. Maple Hudson).   ROS- no headaches, no chest pain, no palpitations, no abdominal pain/diarrhea/constipation, no dysuria/frequency, right knee and left shoulder pain- [place] occasionally.   Past Medical History:  Diagnosis Date  . COPD (chronic obstructive pulmonary disease) (HCC)   . GERD (gastroesophageal reflux disease)   . History of hepatitis C    treated several years ago through [place]  . Hypertension    Past Surgical History:  Procedure Laterality Date  . CATARACT EXTRACTION, BILATERAL    . EYE SURGERY Bilateral   . ORTHOPEDIC SURGERY Bilateral    pt states fracture repairs to arms and legs.   Family History  Problem Relation Age of Onset  . Heart disease Mother   . Hypertension Father    Social History   Tobacco Use  . Smoking status: Former Smoker    Packs/day: 1.00    Years: 35.00    Pack years: 35.00    Types: Cigarettes    Quit date: 10/01/2010    Years since quitting: 8.8  . Smokeless tobacco: Never Used  Substance Use Topics  . Alcohol use: Yes    Alcohol/week: 42.0 standard drinks    Types: 42 Cans of beer per week    Comment: daily - 6  drinks a day  . Drug use: Yes    Types: Marijuana    Comment: last marijuana 2-3 weeks ago     Review of Systems Per HPI    Objective:   Physical Exam Physical Exam  Constitutional: Oriented to person, place, and time. He appears well-developed and well-nourished.  HENT:  Head: Normocephalic and atraumatic.  Eyes: Conjunctivae are normal.  Neck: Normal range of motion. Neck supple.  Cardiovascular: Normal rate, regular rhythm and normal heart sounds.   Pulmonary/Chest: Effort normal and breath sounds normal.  Musculoskeletal: Normal range of motion.  Neurological: Alert and oriented to person, place, and time.  Skin: Skin is warm and dry.  Psychiatric: Normal mood and affect. Behavior is normal. Judgment and thought content normal.  Vitals reviewed.   BP 118/74 (BP Location: Left Arm, Patient Position: Sitting, Cuff Size: Normal)   Pulse 95   Temp 98.1 F (36.7 C) (Temporal)   Ht 6\' 2"  (1.88 m)   Wt 223 lb 12.8 oz (101.5 kg)   SpO2 97%   BMI 28.73 kg/m  Wt Readings from Last 3 Encounters:  08/09/19 223 lb 12.8 oz (101.5 kg)  10/15/18 212 lb 9.6 oz (96.4 kg)  04/10/18 210 lb 3.2 oz (95.3 kg)      Assessment & Plan:  1. Encounter to establish care - reviewed available records in EMR - Discussed and encouraged healthy lifestyle  choices- adequate sleep, regular exercise, stress management and healthy food choices.    2. COPD mixed type ([place]) - compliant with inhalers, continue follow up with pulmonary  3. Overweight (BMI 25.0-29.9) - some weight gain over last year, encouraged him to make healthy food choices, avoid soda/sweet tea, processed and fast foods.  - Lipid Panel - CBC with Differential - Comprehensive metabolic panel - TSH - Hemoglobin A1c  4. Need for Tdap vaccination - Tdap vaccine greater than or equal to 7yo IM  5. Screening for prostate cancer - PSA  6. Essential hypertension - well controlled - Hemoglobin A1c  7. Hyperlipidemia,  unspecified hyperlipidemia type - pravastatin (PRAVACHOL) 20 MG tablet; Take 20 mg by mouth daily.  8. Gastroesophageal reflux disease, unspecified whether esophagitis present - will assess further at follow up to see if can be weaned - pantoprazole (PROTONIX) 20 MG tablet; Take 1 tablet (20 mg total) by mouth every morning.  Dispense: 90 tablet; Refill: 0  9. Alcohol use - he is currently drinking 6-7 beers nightly, reports that this is significantly less than he used to drink.  - discussed recommendations of no more than 2 alcoholic beverages per day for men and encouraged him to decrease intake  - follow up in 6 months for CPE  Clarene Reamer, FNP-BC  [facility], [place]  08/13/2019 6:22 AM  This visit occurred during the SARS-CoV-2 public health emergency.  Safety protocols were in place, including screening questions prior to the visit, additional usage of staff PPE, and extensive cleaning of exam room while observing appropriate contact time as indicated for disinfecting solutions.

## 2019-08-10 LAB — CBC WITH DIFFERENTIAL/PLATELET
Absolute Monocytes: 762 cells/uL (ref 200–950)
Basophils Absolute: 32 cells/uL (ref 0–200)
Basophils Relative: 0.5 %
Eosinophils Absolute: 158 cells/uL (ref 15–500)
Eosinophils Relative: 2.5 %
HCT: 43.1 % (ref 38.5–50.0)
Hemoglobin: 15.2 g/dL (ref 13.2–17.1)
Lymphs Abs: 1436 cells/uL (ref 850–3900)
MCH: 34.2 pg — ABNORMAL HIGH (ref 27.0–33.0)
MCHC: 35.3 g/dL (ref 32.0–36.0)
MCV: 97.1 fL (ref 80.0–100.0)
MPV: 10.7 fL (ref 7.5–12.5)
Monocytes Relative: 12.1 %
Neutro Abs: 3912 cells/uL (ref 1500–7800)
Neutrophils Relative %: 62.1 %
Platelets: 177 10*3/uL (ref 140–400)
RBC: 4.44 10*6/uL (ref 4.20–5.80)
RDW: 12 % (ref 11.0–15.0)
Total Lymphocyte: 22.8 %
WBC: 6.3 10*3/uL (ref 3.8–10.8)

## 2019-08-10 LAB — COMPREHENSIVE METABOLIC PANEL
AG Ratio: 1.6 (calc) (ref 1.0–2.5)
ALT: 42 U/L (ref 9–46)
AST: 45 U/L — ABNORMAL HIGH (ref 10–35)
Albumin: 4.5 g/dL (ref 3.6–5.1)
Alkaline phosphatase (APISO): 68 U/L (ref 35–144)
BUN: 12 mg/dL (ref 7–25)
CO2: 24 mmol/L (ref 20–32)
Calcium: 9.5 mg/dL (ref 8.6–10.3)
Chloride: 104 mmol/L (ref 98–110)
Creat: 0.99 mg/dL (ref 0.70–1.25)
Globulin: 2.9 g/dL (calc) (ref 1.9–3.7)
Glucose, Bld: 99 mg/dL (ref 65–99)
Potassium: 4.3 mmol/L (ref 3.5–5.3)
Sodium: 139 mmol/L (ref 135–146)
Total Bilirubin: 0.6 mg/dL (ref 0.2–1.2)
Total Protein: 7.4 g/dL (ref 6.1–8.1)

## 2019-08-10 LAB — PSA: PSA: 0.3 ng/mL (ref <=4.0)

## 2019-08-10 LAB — LIPID PANEL
Cholesterol: 179 mg/dL (ref <200)
HDL: 60 mg/dL (ref >=40)
LDL Cholesterol (Calc): 102 mg/dL (calc) — ABNORMAL HIGH
Non-HDL Cholesterol (Calc): 119 mg/dL (calc) (ref <130)
Total CHOL/HDL Ratio: 3 (calc) (ref <5.0)
Triglycerides: 84 mg/dL (ref <150)

## 2019-08-10 LAB — HEMOGLOBIN A1C
Hgb A1c MFr Bld: 5.4 % of total Hgb (ref <5.7)
Mean Plasma Glucose: 108 (calc)
eAG (mmol/L): 6 (calc)

## 2019-08-10 LAB — TSH: TSH: 4.67 mIU/L — ABNORMAL HIGH (ref 0.40–4.50)

## 2019-08-13 DIAGNOSIS — E785 Hyperlipidemia, unspecified: Secondary | ICD-10-CM | POA: Insufficient documentation

## 2019-08-13 DIAGNOSIS — E663 Overweight: Secondary | ICD-10-CM | POA: Insufficient documentation

## 2019-08-13 DIAGNOSIS — K219 Gastro-esophageal reflux disease without esophagitis: Secondary | ICD-10-CM | POA: Insufficient documentation

## 2019-08-13 DIAGNOSIS — I10 Essential (primary) hypertension: Secondary | ICD-10-CM | POA: Insufficient documentation

## 2019-08-27 ENCOUNTER — Encounter: Payer: Self-pay | Admitting: *Deleted

## 2019-09-18 ENCOUNTER — Telehealth: Payer: Self-pay | Admitting: Internal Medicine

## 2019-09-18 NOTE — Telephone Encounter (Signed)
Attempted to call patient but he did not answer. His VM is not setup. Will attempt to call patient back later.

## 2019-09-19 ENCOUNTER — Encounter (HOSPITAL_COMMUNITY)
Admission: RE | Admit: 2019-09-19 | Discharge: 2019-09-19 | Disposition: A | Discharge status: Home / Self Care | Payer: Commercial Managed Care - PPO | Source: Ambulatory Visit | Attending: Orthopedic Surgery | Admitting: Orthopedic Surgery

## 2019-09-19 ENCOUNTER — Inpatient Hospital Stay (HOSPITAL_COMMUNITY): Admission: RE | Admit: 2019-09-19 | Payer: Commercial Managed Care - PPO | Source: Ambulatory Visit

## 2019-09-19 ENCOUNTER — Ambulatory Visit (HOSPITAL_COMMUNITY)
Admission: RE | Admit: 2019-09-19 | Discharge: 2019-09-19 | Disposition: A | Discharge status: Home / Self Care | Payer: Commercial Managed Care - PPO | Source: Ambulatory Visit | Attending: Orthopedic Surgery | Admitting: Orthopedic Surgery

## 2019-09-19 ENCOUNTER — Other Ambulatory Visit: Payer: Self-pay | Admitting: Orthopedic Surgery

## 2019-09-19 ENCOUNTER — Encounter (HOSPITAL_COMMUNITY): Payer: Self-pay

## 2019-09-19 ENCOUNTER — Other Ambulatory Visit: Payer: Self-pay

## 2019-09-19 DIAGNOSIS — Z01811 Encounter for preprocedural respiratory examination: Secondary | ICD-10-CM

## 2019-09-19 LAB — URINALYSIS, ROUTINE W REFLEX MICROSCOPIC
Bilirubin Urine: NEGATIVE
Glucose, UA: NEGATIVE mg/dL
Hgb urine dipstick: NEGATIVE
Ketones, ur: NEGATIVE mg/dL
Leukocytes,Ua: NEGATIVE
Nitrite: NEGATIVE
Protein, ur: NEGATIVE mg/dL
Specific Gravity, Urine: 1.021 (ref 1.005–1.030)
pH: 5 (ref 5.0–8.0)

## 2019-09-19 LAB — COMPREHENSIVE METABOLIC PANEL
ALT: 38 U/L (ref 0–44)
AST: 36 U/L (ref 15–41)
Albumin: 4 g/dL (ref 3.5–5.0)
Alkaline Phosphatase: 54 U/L (ref 38–126)
Anion gap: 10 (ref 5–15)
BUN: 15 mg/dL (ref 8–23)
CO2: 22 mmol/L (ref 22–32)
Calcium: 8.7 mg/dL — ABNORMAL LOW (ref 8.9–10.3)
Chloride: 102 mmol/L (ref 98–111)
Creatinine, Ser: 0.82 mg/dL (ref 0.61–1.24)
GFR calc Af Amer: >60 mL/min (ref >60)
GFR calc non Af Amer: >60 mL/min (ref >60)
Glucose, Bld: 106 mg/dL — ABNORMAL HIGH (ref 70–99)
Potassium: 4.7 mmol/L (ref 3.5–5.1)
Sodium: 134 mmol/L — ABNORMAL LOW (ref 135–145)
Total Bilirubin: 1.1 mg/dL (ref 0.3–1.2)
Total Protein: 7.2 g/dL (ref 6.5–8.1)

## 2019-09-19 LAB — CBC WITH DIFFERENTIAL/PLATELET
Abs Immature Granulocytes: 0.02 10*3/uL (ref 0.00–0.07)
Basophils Absolute: 0 10*3/uL (ref 0.0–0.1)
Basophils Relative: 1 %
Eosinophils Absolute: 0.1 10*3/uL (ref 0.0–0.5)
Eosinophils Relative: 2 %
HCT: 34 % — ABNORMAL LOW (ref 39.0–52.0)
Hemoglobin: 11.8 g/dL — ABNORMAL LOW (ref 13.0–17.0)
Immature Granulocytes: 0 %
Lymphocytes Relative: 14 %
Lymphs Abs: 1.1 10*3/uL (ref 0.7–4.0)
MCH: 34.2 pg — ABNORMAL HIGH (ref 26.0–34.0)
MCHC: 34.7 g/dL (ref 30.0–36.0)
MCV: 98.6 fL (ref 80.0–100.0)
Monocytes Absolute: 0.9 10*3/uL (ref 0.1–1.0)
Monocytes Relative: 12 %
Neutro Abs: 5.5 10*3/uL (ref 1.7–7.7)
Neutrophils Relative %: 71 %
Platelets: 170 10*3/uL (ref 150–400)
RBC: 3.45 MIL/uL — ABNORMAL LOW (ref 4.22–5.81)
RDW: 11.7 % (ref 11.5–15.5)
WBC: 7.6 10*3/uL (ref 4.0–10.5)
nRBC: 0 % (ref 0.0–0.2)

## 2019-09-19 LAB — APTT: aPTT: 34 seconds (ref 24–36)

## 2019-09-19 LAB — SURGICAL PCR SCREEN
MRSA, PCR: NEGATIVE
Staphylococcus aureus: NEGATIVE

## 2019-09-19 LAB — PROTIME-INR
INR: 1 (ref 0.8–1.2)
Prothrombin Time: 13.1 seconds (ref 11.4–15.2)

## 2019-09-19 MED ORDER — ENSURE PRE-SURGERY PO LIQD
296.0000 mL | Freq: Once | ORAL | Status: DC
  Filled 2019-09-19: qty 296

## 2019-09-19 NOTE — Patient Instructions (Addendum)
DUE TO COVID-19 ONLY ONE VISITOR IS ALLOWED TO COME WITH YOU AND STAY IN THE WAITING ROOM ONLY DURING PRE OP AND PROCEDURE DAY OF SURGERY. THE 1 VISITOR MAY VISIT WITH YOU AFTER SURGERY IN YOUR PRIVATE ROOM DURING VISITING HOURS ONLY!  YOU NEED TO HAVE A COVID 19 TEST ON_______ @_______ , THIS TEST MUST BE DONE BEFORE SURGERY, COME  [address], [place] , [phone].  [facility]) ONCE YOUR COVID TEST IS COMPLETED, PLEASE BEGIN THE QUARANTINE INSTRUCTIONS AS OUTLINED IN YOUR HANDOUT.                [name]  09/19/2019   Your procedure is scheduled on: 09-23-19   [facility]  [facility]                    Report to admitting at     1100 AM     Call this number if you have problems the morning of surgery [phone]    Remember: NO SOLID FOOD AFTER MIDNIGHT THE NIGHT PRIOR TO SURGERY. NOTHING BY MOUTH EXCEPT CLEAR LIQUIDS UNTIL     1030 am . PLEASE FINISH ENSURE DRINK PER SURGEON ORDER  WHICH NEEDS TO BE COMPLETED AT       1030 am then nothing by mouth.    CLEAR LIQUID DIET   Foods Allowed                                                                                                           Foods Excluded  Coffee and tea, regular and decaf     NO creamer                                              liquids that you cannot  Plain Jell-O any favor except red or purple                                                        see through such as: Fruit ices (not with fruit pulp)                                                                                 milk, soups, orange juice  Iced Popsicles  All solid food Carbonated beverages, regular and diet                                    Cranberry, grape and apple juices Sports drinks like Gatorade Lightly seasoned clear broth or consume(fat free) Sugar, honey syrup  Sample Menu Breakfast                                 Lunch                                     Supper Cranberry juice                    Beef broth                            Chicken broth Jell-O                                     Grape juice                           Apple juice Coffee or tea                        Jell-O                                      Popsicle                                                Coffee or tea                        Coffee or tea  _____________________________________________________________________    BRUSH YOUR TEETH MORNING OF SURGERY AND RINSE YOUR MOUTH OUT, NO CHEWING GUM CANDY OR MINTS.     Take these medicines the morning of surgery with A SIP OF WATER: inhaler and bring with you,protonix,prevachol   DO NOT TAKE ANY DIABETIC MEDICATIONS DAY OF YOUR SURGERY                               You may not have any metal on your body including hair pins and              piercings  Do not wear jewelry,  lotions, powders or perfumes, deodorant              Men may shave face and neck.   Do not bring valuables to the hospital.  IS NOT             RESPONSIBLE   FOR VALUABLES.  Contacts, dentures or bridgework may not be worn into surgery.      Patients discharged the day of surgery will not be allowed to drive home. IF YOU ARE  HAVING SURGERY AND GOING HOME THE SAME DAY, YOU MUST HAVE AN ADULT TO DRIVE YOU HOME AND BE WITH YOU FOR 24 HOURS. YOU MAY GO HOME BY TAXI OR UBER OR ORTHERWISE, BUT AN ADULT MUST ACCOMPANY YOU HOME AND STAY WITH YOU FOR 24 HOURS.  Name and phone number of your driver:  Special Instructions: N/A              Please read over the following fact sheets you were given: _____________________________________________________________________             [facility] - Preparing for Surgery Before surgery, you can play an important role.  Because skin is not sterile, your skin needs to be as free of germs as possible.  You can reduce the number of germs on your  skin by washing with CHG (chlorahexidine gluconate) soap before surgery.  CHG is an antiseptic cleaner which kills germs and bonds with the skin to continue killing germs even after washing. Please DO NOT use if you have an allergy to CHG or antibacterial soaps.  If your skin becomes reddened/irritated stop using the CHG and inform your nurse when you arrive at Short Stay. Do not shave (including legs and underarms) for at least 48 hours prior to the first CHG shower.  You may shave your face/neck. Please follow these instructions carefully:  1.  Shower with CHG Soap the night before surgery and the  morning of Surgery.  2.  If you choose to wash your hair, wash your hair first as usual with your  normal  shampoo.  3.  After you shampoo, rinse your hair and body thoroughly to remove the  shampoo.                           4.  Use CHG as you would any other liquid soap.  You can apply chg directly  to the skin and wash                       Gently with a scrungie or clean washcloth.  5.  Apply the CHG Soap to your body ONLY FROM THE NECK DOWN.   Do not use on face/ open                           Wound or open sores. Avoid contact with eyes, ears mouth and genitals (private parts).                       Wash face,  Genitals (private parts) with your normal soap.             6.  Wash thoroughly, paying special attention to the area where your surgery  will be performed.  7.  Thoroughly rinse your body with warm water from the neck down.  8.  DO NOT shower/wash with your normal soap after using and rinsing off  the CHG Soap.                9.  Pat yourself dry with a clean towel.            10.  Wear clean pajamas.            11.  Place clean sheets on your bed the night of your first shower and do not  sleep with pets. Day of Surgery : Do  not apply any lotions/deodorants the morning of surgery.  Please wear clean clothes to the hospital/surgery center.  FAILURE TO FOLLOW THESE INSTRUCTIONS MAY  RESULT IN THE CANCELLATION OF YOUR SURGERY PATIENT SIGNATURE_________________________________  NURSE SIGNATURE__________________________________  ________________________________________________________________________   Adam Phenix  An incentive spirometer is a tool that can help keep your lungs clear and active. This tool measures how well you are filling your lungs with each breath. Taking long deep breaths may help reverse or decrease the chance of developing breathing (pulmonary) problems (especially infection) following:  A long period of time when you are unable to move or be active. BEFORE THE PROCEDURE   If the spirometer includes an indicator to show your best effort, your nurse or respiratory therapist will set it to a desired goal.  If possible, sit up straight or lean slightly forward. Try not to slouch.  Hold the incentive spirometer in an upright position. INSTRUCTIONS FOR USE  1. Sit on the edge of your bed if possible, or sit up as far as you can in bed or on a chair. 2. Hold the incentive spirometer in an upright position. 3. Breathe out normally. 4. Place the mouthpiece in your mouth and seal your lips tightly around it. 5. Breathe in slowly and as deeply as possible, raising the piston or the ball toward the top of the column. 6. Hold your breath for 3-5 seconds or for as long as possible. Allow the piston or ball to fall to the bottom of the column. 7. Remove the mouthpiece from your mouth and breathe out normally. 8. Rest for a few seconds and repeat Steps 1 through 7 at least 10 times every 1-2 hours when you are awake. Take your time and take a few normal breaths between deep breaths. 9. The spirometer may include an indicator to show your best effort. Use the indicator as a goal to work toward during each repetition. 10. After each set of 10 deep breaths, practice coughing to be sure your lungs are clear. If you have an incision (the cut made at the  time of surgery), support your incision when coughing by placing a pillow or rolled up towels firmly against it. Once you are able to get out of bed, walk around indoors and cough well. You may stop using the incentive spirometer when instructed by your caregiver.  RISKS AND COMPLICATIONS  Take your time so you do not get dizzy or light-headed.  If you are in pain, you may need to take or ask for pain medication before doing incentive spirometry. It is harder to take a deep breath if you are having pain. AFTER USE  Rest and breathe slowly and easily.  It can be helpful to keep track of a log of your progress. Your caregiver can provide you with a simple table to help with this. If you are using the spirometer at home, follow these instructions: [place] IF:   You are having difficultly using the spirometer.  You have trouble using the spirometer as often as instructed.  Your pain medication is not giving enough relief while using the spirometer.  You develop fever of 100.5 F (38.1 C) or higher. SEEK IMMEDIATE MEDICAL CARE IF:   You cough up bloody sputum that had not been present before.  You develop fever of 102 F (38.9 C) or greater.  You develop worsening pain at or near the incision site. MAKE SURE YOU:   Understand these instructions.  Will watch your condition.  Will get help right away if you are not doing well or get worse. Document Released: 01/16/2007 Document Revised: 11/28/2011 Document Reviewed: 03/19/2007 [facility] Patient Information 2014 [place], [place].   ________________________________________________________________________

## 2019-09-19 NOTE — Progress Notes (Signed)
Please place orders in epic for surgery . TY

## 2019-09-19 NOTE — Telephone Encounter (Signed)
ATC pt, there was no answer and I could not leave a message due to his voicemail not being set up. Will try back.

## 2019-09-21 ENCOUNTER — Other Ambulatory Visit (HOSPITAL_COMMUNITY)
Admission: RE | Admit: 2019-09-21 | Discharge: 2019-09-21 | Disposition: A | Discharge status: Home / Self Care | Payer: Commercial Managed Care - PPO | Source: Ambulatory Visit | Attending: Orthopedic Surgery | Admitting: Orthopedic Surgery

## 2019-09-21 DIAGNOSIS — Z20822 Contact with and (suspected) exposure to covid-19: Secondary | ICD-10-CM | POA: Insufficient documentation

## 2019-09-21 DIAGNOSIS — Z01812 Encounter for preprocedural laboratory examination: Secondary | ICD-10-CM | POA: Insufficient documentation

## 2019-09-21 LAB — SARS CORONAVIRUS 2 (TAT 6-24 HRS): SARS Coronavirus 2: NEGATIVE

## 2019-09-23 ENCOUNTER — Encounter (HOSPITAL_COMMUNITY)
Admission: RE | Disposition: A | Discharge status: Home / Self Care | Payer: Self-pay | Source: Home / Self Care | Attending: Orthopedic Surgery

## 2019-09-23 ENCOUNTER — Ambulatory Visit (HOSPITAL_COMMUNITY): Payer: Commercial Managed Care - PPO | Admitting: Physician Assistant

## 2019-09-23 ENCOUNTER — Ambulatory Visit (HOSPITAL_COMMUNITY): Payer: Commercial Managed Care - PPO

## 2019-09-23 ENCOUNTER — Encounter (HOSPITAL_COMMUNITY): Payer: Self-pay | Admitting: Orthopedic Surgery

## 2019-09-23 ENCOUNTER — Inpatient Hospital Stay (HOSPITAL_COMMUNITY)
Admission: RE | Admit: 2019-09-23 | Discharge: 2019-09-24 | DRG: 483 | Disposition: A | Discharge status: Home / Self Care | Payer: Commercial Managed Care - PPO | Attending: Orthopedic Surgery | Admitting: Orthopedic Surgery

## 2019-09-23 ENCOUNTER — Other Ambulatory Visit: Payer: Self-pay

## 2019-09-23 ENCOUNTER — Ambulatory Visit (HOSPITAL_COMMUNITY): Payer: Commercial Managed Care - PPO | Admitting: Anesthesiology

## 2019-09-23 DIAGNOSIS — Z20822 Contact with and (suspected) exposure to covid-19: Secondary | ICD-10-CM | POA: Diagnosis present

## 2019-09-23 DIAGNOSIS — J449 Chronic obstructive pulmonary disease, unspecified: Secondary | ICD-10-CM | POA: Diagnosis present

## 2019-09-23 DIAGNOSIS — Z7951 Long term (current) use of inhaled steroids: Secondary | ICD-10-CM

## 2019-09-23 DIAGNOSIS — M722 Plantar fascial fibromatosis: Secondary | ICD-10-CM

## 2019-09-23 DIAGNOSIS — I10 Essential (primary) hypertension: Secondary | ICD-10-CM | POA: Diagnosis present

## 2019-09-23 DIAGNOSIS — Z87891 Personal history of nicotine dependence: Secondary | ICD-10-CM

## 2019-09-23 DIAGNOSIS — K219 Gastro-esophageal reflux disease without esophagitis: Secondary | ICD-10-CM | POA: Diagnosis present

## 2019-09-23 DIAGNOSIS — S42292A Other displaced fracture of upper end of left humerus, initial encounter for closed fracture: Secondary | ICD-10-CM | POA: Diagnosis present

## 2019-09-23 DIAGNOSIS — Z96612 Presence of left artificial shoulder joint: Secondary | ICD-10-CM

## 2019-09-23 DIAGNOSIS — W1830XA Fall on same level, unspecified, initial encounter: Secondary | ICD-10-CM | POA: Diagnosis present

## 2019-09-23 DIAGNOSIS — Z79899 Other long term (current) drug therapy: Secondary | ICD-10-CM | POA: Diagnosis not present

## 2019-09-23 DIAGNOSIS — D62 Acute posthemorrhagic anemia: Secondary | ICD-10-CM | POA: Diagnosis not present

## 2019-09-23 DIAGNOSIS — Z419 Encounter for procedure for purposes other than remedying health state, unspecified: Secondary | ICD-10-CM

## 2019-09-23 DIAGNOSIS — Z7982 Long term (current) use of aspirin: Secondary | ICD-10-CM

## 2019-09-23 HISTORY — PX: REVERSE SHOULDER ARTHROPLASTY: SHX5054

## 2019-09-23 LAB — HEMOGLOBIN AND HEMATOCRIT, BLOOD
HCT: 36.9 % — ABNORMAL LOW (ref 39.0–52.0)
Hemoglobin: 12.3 g/dL — ABNORMAL LOW (ref 13.0–17.0)

## 2019-09-23 LAB — COMPREHENSIVE METABOLIC PANEL
ALT: 28 U/L (ref 0–44)
AST: 27 U/L (ref 15–41)
Albumin: 4 g/dL (ref 3.5–5.0)
Alkaline Phosphatase: 46 U/L (ref 38–126)
Anion gap: 10 (ref 5–15)
BUN: 16 mg/dL (ref 8–23)
CO2: 25 mmol/L (ref 22–32)
Calcium: 8.6 mg/dL — ABNORMAL LOW (ref 8.9–10.3)
Chloride: 102 mmol/L (ref 98–111)
Creatinine, Ser: 0.84 mg/dL (ref 0.61–1.24)
GFR calc Af Amer: >60 mL/min (ref >60)
GFR calc non Af Amer: >60 mL/min (ref >60)
Glucose, Bld: 169 mg/dL — ABNORMAL HIGH (ref 70–99)
Potassium: 4.8 mmol/L (ref 3.5–5.1)
Sodium: 137 mmol/L (ref 135–145)
Total Bilirubin: 1.4 mg/dL — ABNORMAL HIGH (ref 0.3–1.2)
Total Protein: 6.4 g/dL — ABNORMAL LOW (ref 6.5–8.1)

## 2019-09-23 LAB — CBC
HCT: 37 % — ABNORMAL LOW (ref 39.0–52.0)
Hemoglobin: 12.1 g/dL — ABNORMAL LOW (ref 13.0–17.0)
MCH: 34.9 pg — ABNORMAL HIGH (ref 26.0–34.0)
MCHC: 32.7 g/dL (ref 30.0–36.0)
MCV: 106.6 fL — ABNORMAL HIGH (ref 80.0–100.0)
Platelets: 269 10*3/uL (ref 150–400)
RBC: 3.47 MIL/uL — ABNORMAL LOW (ref 4.22–5.81)
RDW: 12.7 % (ref 11.5–15.5)
WBC: 13.1 10*3/uL — ABNORMAL HIGH (ref 4.0–10.5)
nRBC: 0 % (ref 0.0–0.2)

## 2019-09-23 LAB — MAGNESIUM: Magnesium: 2 mg/dL (ref 1.7–2.4)

## 2019-09-23 LAB — PHOSPHORUS: Phosphorus: 5.5 mg/dL — ABNORMAL HIGH (ref 2.5–4.6)

## 2019-09-23 SURGERY — ARTHROPLASTY, SHOULDER, TOTAL, REVERSE
Anesthesia: General | Site: Shoulder | Laterality: Left

## 2019-09-23 MED ORDER — OXYCODONE HCL 5 MG PO TABS: 10.0000 mg | ORAL_TABLET | ORAL | Status: DC | PRN

## 2019-09-23 MED ORDER — BUPIVACAINE HCL (PF) 0.5 % IJ SOLN
INTRAMUSCULAR | Status: DC | PRN
  Administered 2019-09-23: 15 mg via PERINEURAL

## 2019-09-23 MED ORDER — METHOCARBAMOL 500 MG PO TABS: 500.0000 mg | ORAL_TABLET | Freq: Four times a day (QID) | ORAL | Status: DC | PRN

## 2019-09-23 MED ORDER — ALUM & MAG HYDROXIDE-SIMETH 200-200-20 MG/5ML PO SUSP: 30.0000 mL | ORAL | Status: DC | PRN

## 2019-09-23 MED ORDER — SODIUM CHLORIDE 0.9 % IR SOLN
Status: DC | PRN
  Administered 2019-09-23: 1000 mL

## 2019-09-23 MED ORDER — LORAZEPAM 2 MG/ML IJ SOLN
1.0000 mg | INTRAMUSCULAR | Status: DC | PRN
  Administered 2019-09-23: 1 mg via INTRAVENOUS
  Administered 2019-09-23: 2 mg via INTRAVENOUS
  Filled 2019-09-23 (×2): qty 1

## 2019-09-23 MED ORDER — ONDANSETRON HCL 4 MG/2ML IJ SOLN: 4.0000 mg | Freq: Four times a day (QID) | INTRAMUSCULAR | Status: DC | PRN

## 2019-09-23 MED ORDER — FOLIC ACID 1 MG PO TABS
1.0000 mg | ORAL_TABLET | Freq: Every day | ORAL | Status: DC
  Administered 2019-09-23 – 2019-09-24 (×2): 1 mg via ORAL
  Filled 2019-09-23 (×2): qty 1

## 2019-09-23 MED ORDER — ONDANSETRON HCL 4 MG/2ML IJ SOLN
INTRAMUSCULAR | Status: DC | PRN
  Administered 2019-09-23: 4 mg via INTRAVENOUS

## 2019-09-23 MED ORDER — ONDANSETRON HCL 4 MG PO TABS: 4.0000 mg | ORAL_TABLET | Freq: Four times a day (QID) | ORAL | Status: DC | PRN

## 2019-09-23 MED ORDER — 0.9 % SODIUM CHLORIDE (POUR BTL) OPTIME
TOPICAL | Status: DC | PRN
  Administered 2019-09-23: 14:00:00 1000 mL

## 2019-09-23 MED ORDER — SUGAMMADEX SODIUM 200 MG/2ML IV SOLN
INTRAVENOUS | Status: DC | PRN
  Administered 2019-09-23: 200 mg via INTRAVENOUS

## 2019-09-23 MED ORDER — DIPHENHYDRAMINE HCL 12.5 MG/5ML PO ELIX: 12.5000 mg | ORAL_SOLUTION | ORAL | Status: DC | PRN

## 2019-09-23 MED ORDER — PROPOFOL 10 MG/ML IV BOLUS
INTRAVENOUS | Status: AC
  Filled 2019-09-23: qty 20

## 2019-09-23 MED ORDER — FENTANYL CITRATE (PF) 100 MCG/2ML IJ SOLN
INTRAMUSCULAR | Status: AC
  Filled 2019-09-23: qty 2

## 2019-09-23 MED ORDER — OXYCODONE-ACETAMINOPHEN 5-325 MG PO TABS: ORAL_TABLET | ORAL | 0 refills | Status: DC

## 2019-09-23 MED ORDER — PROPOFOL 10 MG/ML IV BOLUS
INTRAVENOUS | Status: DC | PRN
  Administered 2019-09-23: 200 mg via INTRAVENOUS

## 2019-09-23 MED ORDER — SODIUM CHLORIDE 3 % IN NEBU
INHALATION_SOLUTION | RESPIRATORY_TRACT | Status: AC
  Filled 2019-09-23: qty 4

## 2019-09-23 MED ORDER — CHLORHEXIDINE GLUCONATE 4 % EX LIQD: 60.0000 mL | Freq: Once | CUTANEOUS | Status: DC

## 2019-09-23 MED ORDER — CEFAZOLIN SODIUM-DEXTROSE 2-4 GM/100ML-% IV SOLN
2.0000 g | INTRAVENOUS | Status: DC
  Filled 2019-09-23: qty 100

## 2019-09-23 MED ORDER — PRAVASTATIN SODIUM 20 MG PO TABS
20.0000 mg | ORAL_TABLET | Freq: Every day | ORAL | Status: DC
  Administered 2019-09-23 – 2019-09-24 (×2): 20 mg via ORAL
  Filled 2019-09-23 (×2): qty 1

## 2019-09-23 MED ORDER — DOCUSATE SODIUM 100 MG PO CAPS
100.0000 mg | ORAL_CAPSULE | Freq: Two times a day (BID) | ORAL | Status: DC
  Administered 2019-09-23 – 2019-09-24 (×2): 100 mg via ORAL
  Filled 2019-09-23 (×2): qty 1

## 2019-09-23 MED ORDER — SODIUM CHLORIDE 0.9 % IV SOLN: INTRAVENOUS | Status: DC

## 2019-09-23 MED ORDER — MENTHOL 3 MG MT LOZG: 1.0000 | LOZENGE | OROMUCOSAL | Status: DC | PRN

## 2019-09-23 MED ORDER — THIAMINE HCL 100 MG PO TABS
100.0000 mg | ORAL_TABLET | Freq: Every day | ORAL | Status: DC
  Administered 2019-09-23 – 2019-09-24 (×2): 100 mg via ORAL
  Filled 2019-09-23 (×2): qty 1

## 2019-09-23 MED ORDER — ZOLPIDEM TARTRATE 5 MG PO TABS: 5.0000 mg | ORAL_TABLET | Freq: Every evening | ORAL | Status: DC | PRN

## 2019-09-23 MED ORDER — ADULT MULTIVITAMIN W/MINERALS CH
1.0000 | ORAL_TABLET | Freq: Every day | ORAL | Status: DC
  Administered 2019-09-23 – 2019-09-24 (×2): 1 via ORAL
  Filled 2019-09-23 (×2): qty 1

## 2019-09-23 MED ORDER — PHENYLEPHRINE 40 MCG/ML (10ML) SYRINGE FOR IV PUSH (FOR BLOOD PRESSURE SUPPORT)
PREFILLED_SYRINGE | INTRAVENOUS | Status: DC | PRN
  Administered 2019-09-23: 80 ug via INTRAVENOUS

## 2019-09-23 MED ORDER — LORAZEPAM 1 MG PO TABS: 1.0000 mg | ORAL_TABLET | ORAL | Status: DC | PRN

## 2019-09-23 MED ORDER — PHENYLEPHRINE HCL-NACL 10-0.9 MG/250ML-% IV SOLN
INTRAVENOUS | Status: DC | PRN
  Administered 2019-09-23: 40 ug/min via INTRAVENOUS

## 2019-09-23 MED ORDER — METOCLOPRAMIDE HCL 5 MG PO TABS: 5.0000 mg | ORAL_TABLET | Freq: Three times a day (TID) | ORAL | Status: DC | PRN

## 2019-09-23 MED ORDER — FENTANYL CITRATE (PF) 100 MCG/2ML IJ SOLN
INTRAMUSCULAR | Status: DC | PRN
  Administered 2019-09-23 (×2): 25 ug via INTRAVENOUS

## 2019-09-23 MED ORDER — MIDAZOLAM HCL 2 MG/2ML IJ SOLN
1.0000 mg | INTRAMUSCULAR | Status: DC
  Administered 2019-09-23: 2 mg via INTRAVENOUS
  Filled 2019-09-23: qty 2

## 2019-09-23 MED ORDER — ALBUMIN HUMAN 25 % IV SOLN: INTRAVENOUS | Status: DC | PRN

## 2019-09-23 MED ORDER — POVIDONE-IODINE 10 % EX SWAB: 2.0000 "application " | Freq: Once | CUTANEOUS | Status: DC

## 2019-09-23 MED ORDER — DEXAMETHASONE SODIUM PHOSPHATE 10 MG/ML IJ SOLN
INTRAMUSCULAR | Status: DC | PRN
  Administered 2019-09-23: 8 mg via INTRAVENOUS

## 2019-09-23 MED ORDER — SODIUM CHLORIDE 0.9 % IV BOLUS
500.0000 mL | Freq: Once | INTRAVENOUS | Status: AC
  Administered 2019-09-23: 500 mL via INTRAVENOUS

## 2019-09-23 MED ORDER — METHOCARBAMOL 1000 MG/10ML IJ SOLN
500.0000 mg | Freq: Four times a day (QID) | INTRAVENOUS | Status: DC | PRN
  Filled 2019-09-23: qty 5

## 2019-09-23 MED ORDER — FENTANYL CITRATE (PF) 100 MCG/2ML IJ SOLN
50.0000 ug | INTRAMUSCULAR | Status: DC
  Administered 2019-09-23: 100 ug via INTRAVENOUS
  Filled 2019-09-23: qty 2

## 2019-09-23 MED ORDER — PHENOL 1.4 % MT LIQD: 1.0000 | OROMUCOSAL | Status: DC | PRN

## 2019-09-23 MED ORDER — SUCCINYLCHOLINE CHLORIDE 200 MG/10ML IV SOSY
PREFILLED_SYRINGE | INTRAVENOUS | Status: DC | PRN
  Administered 2019-09-23: 140 mg via INTRAVENOUS

## 2019-09-23 MED ORDER — LEVALBUTEROL HCL 1.25 MG/0.5ML IN NEBU
INHALATION_SOLUTION | RESPIRATORY_TRACT | Status: AC
  Administered 2019-09-23: 1.25 mg
  Filled 2019-09-23: qty 0.5

## 2019-09-23 MED ORDER — ACETAMINOPHEN 500 MG PO TABS
1000.0000 mg | ORAL_TABLET | Freq: Once | ORAL | Status: AC
  Administered 2019-09-23: 1000 mg via ORAL
  Filled 2019-09-23: qty 2

## 2019-09-23 MED ORDER — STERILE WATER FOR IRRIGATION IR SOLN
Status: DC | PRN
  Administered 2019-09-23: 2000 mL

## 2019-09-23 MED ORDER — ROCURONIUM BROMIDE 10 MG/ML (PF) SYRINGE
PREFILLED_SYRINGE | INTRAVENOUS | Status: DC | PRN
  Administered 2019-09-23: 10 mg via INTRAVENOUS
  Administered 2019-09-23: 30 mg via INTRAVENOUS
  Administered 2019-09-23 (×3): 10 mg via INTRAVENOUS

## 2019-09-23 MED ORDER — LACTATED RINGERS IV SOLN: INTRAVENOUS | Status: DC

## 2019-09-23 MED ORDER — BISACODYL 5 MG PO TBEC: 5.0000 mg | DELAYED_RELEASE_TABLET | Freq: Every day | ORAL | Status: DC | PRN

## 2019-09-23 MED ORDER — TIZANIDINE HCL 4 MG PO TABS: 4.0000 mg | ORAL_TABLET | Freq: Three times a day (TID) | ORAL | 1 refills | Status: DC | PRN

## 2019-09-23 MED ORDER — POLYETHYLENE GLYCOL 3350 17 G PO PACK: 17.0000 g | PACK | Freq: Every day | ORAL | Status: DC | PRN

## 2019-09-23 MED ORDER — ACETAMINOPHEN 325 MG PO TABS: 325.0000 mg | ORAL_TABLET | Freq: Four times a day (QID) | ORAL | Status: DC | PRN

## 2019-09-23 MED ORDER — FENTANYL CITRATE (PF) 100 MCG/2ML IJ SOLN: 25.0000 ug | INTRAMUSCULAR | Status: DC | PRN

## 2019-09-23 MED ORDER — LISINOPRIL 20 MG PO TABS
20.0000 mg | ORAL_TABLET | Freq: Every day | ORAL | Status: DC
  Administered 2019-09-23 – 2019-09-24 (×2): 20 mg via ORAL
  Filled 2019-09-23 (×2): qty 1

## 2019-09-23 MED ORDER — TRANEXAMIC ACID-NACL 1000-0.7 MG/100ML-% IV SOLN
1000.0000 mg | INTRAVENOUS | Status: DC
  Filled 2019-09-23: qty 100

## 2019-09-23 MED ORDER — BUPIVACAINE LIPOSOME 1.3 % IJ SUSP
INTRAMUSCULAR | Status: DC | PRN
  Administered 2019-09-23: 10 mg via PERINEURAL

## 2019-09-23 MED ORDER — CELECOXIB 200 MG PO CAPS
400.0000 mg | ORAL_CAPSULE | Freq: Once | ORAL | Status: AC
  Administered 2019-09-23: 400 mg via ORAL
  Filled 2019-09-23: qty 2

## 2019-09-23 MED ORDER — OXYCODONE HCL 5 MG PO TABS: 5.0000 mg | ORAL_TABLET | ORAL | Status: DC | PRN

## 2019-09-23 MED ORDER — METOCLOPRAMIDE HCL 5 MG/ML IJ SOLN: 5.0000 mg | Freq: Three times a day (TID) | INTRAMUSCULAR | Status: DC | PRN

## 2019-09-23 MED ORDER — PANTOPRAZOLE SODIUM 40 MG PO TBEC
40.0000 mg | DELAYED_RELEASE_TABLET | Freq: Every day | ORAL | Status: DC
  Administered 2019-09-23 – 2019-09-24 (×2): 40 mg via ORAL
  Filled 2019-09-23 (×2): qty 1

## 2019-09-23 MED ORDER — LEVALBUTEROL HCL 1.25 MG/0.5ML IN NEBU: 1.2500 mg | INHALATION_SOLUTION | Freq: Once | RESPIRATORY_TRACT | Status: DC

## 2019-09-23 MED ORDER — HYDROMORPHONE HCL 1 MG/ML IJ SOLN: 0.5000 mg | INTRAMUSCULAR | Status: DC | PRN

## 2019-09-23 MED ORDER — ASPIRIN EC 325 MG PO TBEC
325.0000 mg | DELAYED_RELEASE_TABLET | Freq: Two times a day (BID) | ORAL | Status: DC
  Administered 2019-09-23 – 2019-09-24 (×2): 325 mg via ORAL
  Filled 2019-09-23 (×2): qty 1

## 2019-09-23 MED ORDER — THIAMINE HCL 100 MG/ML IJ SOLN
100.0000 mg | Freq: Every day | INTRAMUSCULAR | Status: DC
  Filled 2019-09-23 (×2): qty 1

## 2019-09-23 MED ORDER — CEFAZOLIN SODIUM-DEXTROSE 2-4 GM/100ML-% IV SOLN
2.0000 g | Freq: Four times a day (QID) | INTRAVENOUS | Status: AC
  Administered 2019-09-23 – 2019-09-24 (×3): 2 g via INTRAVENOUS
  Filled 2019-09-23 (×3): qty 100

## 2019-09-23 MED ORDER — PROMETHAZINE HCL 25 MG/ML IJ SOLN: 6.2500 mg | INTRAMUSCULAR | Status: DC | PRN

## 2019-09-23 MED ORDER — FLEET ENEMA 7-19 GM/118ML RE ENEM: 1.0000 | ENEMA | Freq: Once | RECTAL | Status: DC | PRN

## 2019-09-23 SURGICAL SUPPLY — 87 items
BAG ZIPLOCK 12X15 (MISCELLANEOUS) ×2 IMPLANT
BASEPLATE REV SHOULDER 29 OD (Plate) ×2 IMPLANT
BIT DRILL 1.6MX128 (BIT) ×2 IMPLANT
BIT DRILL 3.2 PERIPHERAL SCREW (BIT) ×2 IMPLANT
BLADE SAW SAG 73X25 THK (BLADE) ×1
BLADE SAW SGTL 73X25 THK (BLADE) ×1 IMPLANT
BODY PROXIMAL PTC 13X132.5 (Joint) ×1 IMPLANT
BOOTIES KNEE HIGH SLOAN (MISCELLANEOUS) ×4 IMPLANT
CABLE CR CO 1.8MM 36L (Orthopedic Implant) ×4 IMPLANT
CAP LOCKING COCR (Cap) ×2 IMPLANT
CLSR STERI-STRIP ANTIMIC 1/2X4 (GAUZE/BANDAGES/DRESSINGS) ×2 IMPLANT
COOLER ICEMAN CLASSIC (MISCELLANEOUS) IMPLANT
COVER BACK TABLE 60X90IN (DRAPES) ×2 IMPLANT
COVER SURGICAL LIGHT HANDLE (MISCELLANEOUS) ×2 IMPLANT
COVER WAND RF STERILE (DRAPES) IMPLANT
DRAPE INCISE IOBAN 66X45 STRL (DRAPES) ×2 IMPLANT
DRAPE ORTHO SPLIT 77X108 STRL (DRAPES) ×2
DRAPE POUCH INSTRU U-SHP 10X18 (DRAPES) ×2 IMPLANT
DRAPE SHEET LG 3/4 BI-LAMINATE (DRAPES) ×2 IMPLANT
DRAPE SURG 17X11 SM STRL (DRAPES) ×2 IMPLANT
DRAPE SURG ORHT 6 SPLT 77X108 (DRAPES) ×2 IMPLANT
DRAPE U-SHAPE 47X51 STRL (DRAPES) ×2 IMPLANT
DRESSING AQUACEL AG SP 3.5X10 (GAUZE/BANDAGES/DRESSINGS) ×1 IMPLANT
DRSG AQUACEL AG ADV 3.5X 6 (GAUZE/BANDAGES/DRESSINGS) ×2 IMPLANT
DRSG AQUACEL AG ADV 3.5X10 (GAUZE/BANDAGES/DRESSINGS) ×2 IMPLANT
DRSG AQUACEL AG SP 3.5X10 (GAUZE/BANDAGES/DRESSINGS) ×2
DURAPREP 26ML APPLICATOR (WOUND CARE) ×2 IMPLANT
ELECT BLADE TIP CTD 4 INCH (ELECTRODE) ×2 IMPLANT
ELECT REM PT RETURN 15FT ADLT (MISCELLANEOUS) ×2 IMPLANT
EVACUATOR 1/8 PVC DRAIN (DRAIN) IMPLANT
FACESHIELD WRAPAROUND (MASK) ×2 IMPLANT
GLENOSPHERE STANDARD 39 (Joint) ×2 IMPLANT
GLENOSPHERE STD 39 (Joint) ×1 IMPLANT
GLOVE BIO SURGEON STRL SZ7 (GLOVE) ×2 IMPLANT
GLOVE BIO SURGEON STRL SZ7.5 (GLOVE) ×2 IMPLANT
GLOVE BIOGEL PI IND STRL 7.5 (GLOVE) ×1 IMPLANT
GLOVE BIOGEL PI IND STRL 8 (GLOVE) ×1 IMPLANT
GLOVE BIOGEL PI INDICATOR 7.5 (GLOVE) ×1
GLOVE BIOGEL PI INDICATOR 8 (GLOVE) ×1
GOWN STRL REUS W/TWL LRG LVL3 (GOWN DISPOSABLE) ×2 IMPLANT
GOWN STRL REUS W/TWL XL LVL3 (GOWN DISPOSABLE) ×2 IMPLANT
GUIDEWIRE GLENOID 2.5X220 (WIRE) ×2 IMPLANT
HANDPIECE INTERPULSE COAX TIP (DISPOSABLE) ×1
HOOD PEEL AWAY FLYTE STAYCOOL (MISCELLANEOUS) ×6 IMPLANT
INSERT REV KIT SHOULDER 6X39 (Screw) ×2 IMPLANT
KIT BASIN OR (CUSTOM PROCEDURE TRAY) ×2 IMPLANT
KIT TURNOVER KIT A (KITS) IMPLANT
MANIFOLD NEPTUNE II (INSTRUMENTS) ×2 IMPLANT
NEEDLE MA TROC 1/2 (NEEDLE) IMPLANT
NS IRRIG 1000ML POUR BTL (IV SOLUTION) ×2 IMPLANT
PACK SHOULDER (CUSTOM PROCEDURE TRAY) ×2 IMPLANT
PAD COLD SHLDR WRAP-ON (PAD) IMPLANT
PENCIL SMOKE EVACUATOR (MISCELLANEOUS) IMPLANT
PROTECTOR NERVE ULNAR (MISCELLANEOUS) ×2 IMPLANT
PROXIMAL BODY PTC 13X132.5 (Joint) ×2 IMPLANT
RESTRAINT HEAD UNIVERSAL NS (MISCELLANEOUS) IMPLANT
RETRIEVER SUT HEWSON (MISCELLANEOUS) IMPLANT
SCREW 5.5X14 (Screw) ×4 IMPLANT
SCREW BONE THREAD 6.5X35 (Screw) ×2 IMPLANT
SCREW PERIPHERAL 30 (Screw) ×4 IMPLANT
SCREW SHLD ASSEMBLY AEQ 20 (Spacer) ×2 IMPLANT
SET HNDPC FAN SPRY TIP SCT (DISPOSABLE) ×1 IMPLANT
SLING ARM IMMOBILIZER LRG (SOFTGOODS) ×2 IMPLANT
SLING ARM IMMOBILIZER MED (SOFTGOODS) IMPLANT
SMARTMIX MINI TOWER (MISCELLANEOUS)
SPACER SHLD CMT AEQ 13X20 (Spacer) ×2 IMPLANT
SPONGE LAP 18X18 RF (DISPOSABLE) ×2 IMPLANT
STEM HUM PTC DISTAL 13X130 (Miscellaneous) ×2 IMPLANT
STRIP CLOSURE SKIN 1/2X4 (GAUZE/BANDAGES/DRESSINGS) ×4 IMPLANT
SUCTION FRAZIER HANDLE 10FR (MISCELLANEOUS)
SUCTION TUBE FRAZIER 10FR DISP (MISCELLANEOUS) IMPLANT
SUPPORT WRAP ARM LG (MISCELLANEOUS) ×2 IMPLANT
SUT ETHIBOND 2 OS 4 DA (SUTURE) IMPLANT
SUT ETHIBOND NAB CT1 #1 30IN (SUTURE) ×2 IMPLANT
SUT FIBERWIRE #2 38 REV NDL BL (SUTURE)
SUT MNCRL AB 4-0 PS2 18 (SUTURE) ×2 IMPLANT
SUT VIC AB 0 CT1 36 (SUTURE) IMPLANT
SUT VIC AB 2-0 CT1 27 (SUTURE) ×3
SUT VIC AB 2-0 CT1 TAPERPNT 27 (SUTURE) ×3 IMPLANT
SUTURE FIBERWR#2 38 REV NDL BL (SUTURE) IMPLANT
TAPE LABRALWHITE 1.5X36 (TAPE) ×4 IMPLANT
TOWEL OR 17X26 10 PK STRL BLUE (TOWEL DISPOSABLE) ×2 IMPLANT
TOWEL OR NON WOVEN STRL DISP B (DISPOSABLE) ×2 IMPLANT
TOWER SMARTMIX MINI (MISCELLANEOUS) IMPLANT
TRAY TIB REV AEQ 0MM (Shoulder) ×2 IMPLANT
WATER STERILE IRR 1000ML POUR (IV SOLUTION) ×2 IMPLANT
YANKAUER SUCT BULB TIP 10FT TU (MISCELLANEOUS) ×2 IMPLANT

## 2019-09-23 NOTE — H&P (Signed)
[name] is an 64 y.o. male.   Chief Complaint: Left shoulder fracture dislocation   HPI: 64 year old male status post fall with left shoulder intra-articular fracture dislocation.  Indicated for surgical treatment to try and improve functional outcome.  Past Medical History:  Diagnosis Date  . COPD (chronic obstructive pulmonary disease) ([place])   . GERD (gastroesophageal reflux disease)   . History of hepatitis C    treated several years ago through [place]  . Hypertension     Past Surgical History:  Procedure Laterality Date  . CATARACT EXTRACTION, BILATERAL    . EYE SURGERY Bilateral   . ORTHOPEDIC SURGERY Bilateral    pt states fracture repairs to arms and legs.    Family History  Problem Relation Age of Onset  . Heart disease Mother   . Hypertension Father    Social History:  reports that he quit smoking about 8 years ago. His smoking use included cigarettes. He has a 35.00 pack-year smoking history. He has never used smokeless tobacco. He reports current alcohol use of about 42.0 standard drinks of alcohol per week. He reports current drug use. Drug: Marijuana.  Allergies: No Known Allergies  Facility-Administered Medications Prior to Admission  Medication Dose Route Frequency Provider Last Rate Last Admin  . triamcinolone acetonide (KENALOG) 10 MG/ML injection 10 mg  10 mg Other Once Wallene Huh, DPM       Medications Prior to Admission  Medication Sig Dispense Refill  . acetaminophen (TYLENOL) 500 MG tablet Take 500-1,000 mg by mouth every 6 (six) hours as needed (for pain.).    Marland Kitchen aspirin EC 81 MG tablet Take 81 mg by mouth daily.    . Fluticasone-Umeclidin-Vilant (TRELEGY ELLIPTA) 100-62.5-25 MCG/INH AEPB Inhale 1 puff into the lungs daily. 60 each 12  . HYDROcodone-acetaminophen (NORCO/VICODIN) 5-325 MG tablet Take 1 tablet by mouth every 6 (six) hours as needed for moderate pain.    Marland Kitchen ibuprofen (ADVIL,MOTRIN) 200 MG tablet Take 200-400 mg by mouth every 6  (six) hours as needed for moderate pain.     Marland Kitchen ipratropium-albuterol (DUONEB) 0.5-2.5 (3) MG/3ML SOLN INHALE THE CONTENTS OF 1 VIAL (3 ML) VIANEBULIZER EVERY 6 HOURS AS NEEDED (Patient taking differently: Inhale 3 mLs into the lungs every 6 (six) hours as needed (for wheezing/shortness of breath). ) 180 mL 1  . ketoconazole (NIZORAL) 2 % cream Apply 1 application topically daily as needed for irritation.     Marland Kitchen lisinopril (PRINIVIL,ZESTRIL) 20 MG tablet Take 20 mg by mouth daily.     . Multiple Vitamin (MULTIVITAMIN WITH MINERALS) TABS tablet Take 1 tablet by mouth daily.    . pantoprazole (PROTONIX) 40 MG tablet Take 40 mg by mouth daily.    . pravastatin (PRAVACHOL) 20 MG tablet Take 20 mg by mouth daily.    Marland Kitchen PROAIR HFA 108 (90 Base) MCG/ACT inhaler INHALE 2 PUFFS INTO THE LUNGS EVERY 6 HOURS AS NEEDED FOR WHEEZING ORSHORTNESS OF BREATH. (Patient taking differently: Inhale 1-2 puffs into the lungs every 6 (six) hours as needed for wheezing or shortness of breath. ) 8.5 g 3  . Aflibercept 2 MG/0.05ML SOLN Inject 1 Dose into the eye every 8 (eight) weeks.     . pantoprazole (PROTONIX) 20 MG tablet Take 1 tablet (20 mg total) by mouth every morning. (Patient not taking: Reported on 09/19/2019) 90 tablet 0    No results found for this or any previous visit (from the past 48 hour(s)). DG Shoulder Left  Result  Date: 09/23/2019 CLINICAL DATA:  Shoulder fracture. EXAM: LEFT SHOULDER - 2+ VIEW; DG C-ARM 1-60 MIN-NO REPORT COMPARISON:  Outside x-rays 09/16/2019 FINDINGS: Fracture proximal humerus. Humeral head has been resected and a shoulder replacement been performed. Cerclage wires around the proximal humeral fracture. IMPRESSION: Left shoulder replacement for humeral fracture. Electronically Signed   By: Marlan Palau M.D.   On: 09/23/2019 16:38   DG C-Arm 1-60 Min-No Report  Result Date: 09/23/2019 Fluoroscopy was utilized by the requesting physician.  No radiographic interpretation.    Review of  Systems  All other systems reviewed and are negative.   Blood pressure (!) 120/95, pulse 91, temperature (!) 97.4 F (36.3 C), temperature source Oral, resp. rate 18, height 6\' 2"  (1.88 m), weight 104.3 kg, SpO2 98 %. Physical Exam   Assessment/Plan 65 year old male status post fall with left shoulder intra-articular fracture dislocation.  Indicated for surgical treatment to try and improve functional outcome. Plan left shoulder reverse total shoulder arthroplasty with cerclage fixation of fracture. Risks / benefits of surgery discussed Consent on chart  NPO for OR Preop antibiotics   64, MD 09/23/2019, 4:55 PM

## 2019-09-23 NOTE — Telephone Encounter (Signed)
Checked pt's chart and he is scheduled to have the shoulder surgery today at [facility]. Routing to Dr. Maple Hudson as an Lorain Childes.

## 2019-09-23 NOTE — Op Note (Signed)
Procedure(s): REVERSE SHOULDER ARTHROPLASTY Procedure Note  [name] male 64 y.o. 09/23/2019   Preoperative diagnosis: Left shoulder fracture dislocation  Postoperative diagnosis: Same  Procedure(s) and Anesthesia Type:    LEFT REVERSE SHOULDER ARTHROPLASTY with cerclage fixation of humerus fracture- Choice   Indications:  64 y.o. male  With left shoulder fracture dislocation after a fall with intra-articular fracture of the humeral head extending into a oblique metaphyseal fracture extending one third of the humerus.  Indicated for surgical treatment to try and improve functional outcome from this severe injury.  He understood risks benefits and alternatives to the procedure including but not limited to risk of bleeding infection damage to neurovascular structures, dislocation etc.  Elected to proceed.     Surgeon: Isabella Stalling   Assistants: Jeanmarie Hubert PA-C [facility] was present and scrubbed throughout the procedure and was essential in positioning, retraction, exposure, and closure)  Anesthesia: General endotracheal anesthesia with preoperative interscalene block given by the attending anesthesiologist    Procedure Detail  REVERSE SHOULDER ARTHROPLASTY   Estimated Blood Loss:  456mL         Drains: none  Blood Given: none          Specimens: none        Complications:  * No complications entered in OR log *         Disposition: PACU - hemodynamically stable.         Condition: stable      OPERATIVE FINDINGS:  A Tornier reverse total shoulder arthroplasty with modular revive stem with a size 39 glenosphere was placed with 2 cerclage cables around the proximal fracture.  The implant was press-fit distally with excellent fixation and stability.  PROCEDURE: The patient was identified in the preoperative holding area  where I personally marked the operative site after verifying site, side,  and procedure with the patient. An interscalene block  given by  the attending anesthesiologist in the holding area and the patient was taken back to the operating room where all extremities were  carefully padded in position after general anesthesia was induced. She  was placed in a beach-chair position and the operative upper extremity was  prepped and draped in a standard sterile fashion. An approximately 15-  cm incision was made from the tip of the coracoid process to the center  point of the humerus distal to the axilla. Dissection was carried  down through subcutaneous tissues to the level of the cephalic vein  which was taken laterally with the deltoid. The pectoralis major was  retracted medially. The subdeltoid space was developed and the lateral  edge of the conjoined tendon was identified. The undersurface of  conjoined tendon was palpated and the musculocutaneous nerve was not in  the field. Retractor was placed underneath the conjoined and second  retractor was placed lateral into the deltoid.  The fracture was identified and cleaned of fracture hematoma.  Biceps tendon was identified and traced proximally into the rotator interval.  It was released proximally.  The subscapularis was taken down off the lesser tuberosity with a peel with the underlying capsule.  The humeral head was exposed and disimpacted from the anterior glenoid.  The humeral head fracture with severe and involved the entire articular surface.  Small segment of superior supraspinatus insertion was released to allow adequate exposure.  The humeral head was cut at the surgical neck leaving the posterior rotator cuff intact. At this point the proximal humerus was retracted posteriorly allowing  exposure of the glenoid.  The glenoid was then prepared by removing the cartilage and the guide was used to place the central pin.  The central hole was then drilled and sized for the appropriate screw.  The base plate was then placed with the larger size base plate.  The peripheral  locking and nonlocking screws were drilled measured and filled.  A size 39 glenosphere was placed and impacted.  At this point the fracture was held reduced with reduction clamps while 2 cerclage cables were passed with one at the superior aspect of the pectoralis major insertion and 1 at the inferior aspect.  Great care was taken to provide adequate exposure to safely pass the cables.  The cable passing device was passed from medial to lateral and great care was taken to stay directly on bone as the device came around the back of the humerus.  The cerclage cables were provisionally tightened holding the fracture reduced.  This allowed a bit better exposure of the proximal humerus for preparation.  The sounding broaches were used to size the canal and x-ray was used to verify appropriate positioning and fit distally.  The implant was measured and the trial implant was put together on the back table with the appropriate sizes proximally distally and length.  The trial implant was placed with a centralized tray and standard poly-.  The joint was reduced and was noted to have appropriate soft tissue tension and stability.  Therefore the final implant was put together on the back table and placed in a press-fit fashion.  Prior to placing the implant 3 drill holes were made in the lesser tuberosity and suture tapes were passed.  It seated nicely.  The final tray and poly were placed in the joint was reduced. Again the soft tissue tension and stability was felt to be excellent.  The cables were then again and tightened and secured in place and cut. Final fluoroscopic imaging demonstrated appropriate length and alignment of the implant with anatomic reduction of the fracture.  The subscapularis was then repaired with the fiber tapes in a horizontal mattress configuration back to the lesser tuberosity. Copious irrigation was used. The wound was then closed in layers with 2-0 Vicryl and 4 Monocryl.  Steri-Strips  were applied as well as a light sterile dressing.  The patient was allowed to awaken from anesthesia transferred to the stretcher and taken to the recovery room in stable condition.  POSTOPERATIVE PLAN: The patient will be observed in the recovery room.  He has a desire to go home from the recovery room and not spend the night secondary to the large census of Covid patients in the hospital.  We will see how he does in the recovery room and if he has adequate pain control with the regional block and is doing well physiologically we could discharge him home with close follow-up.

## 2019-09-23 NOTE — Transfer of Care (Signed)
Immediate Anesthesia Transfer of Care Note  Patient: [name]  Procedure(s) Performed: Procedure(s): REVERSE SHOULDER ARTHROPLASTY (Left)  Patient Location: PACU  Anesthesia Type:General  Level of Consciousness:  sedated, patient cooperative and responds to stimulation  Airway & Oxygen Therapy:Patient Spontanous Breathing and Patient connected to face mask oxgen  Post-op Assessment:  Report given to PACU RN and Post -op Vital signs reviewed and stable  Post vital signs:  Reviewed and stable  Last Vitals:  Vitals:   09/23/19 1330 09/23/19 1331  BP:  (!) 120/95  Pulse: 89 91  Resp: 20 18  Temp:    SpO2: 95% 98%    Complications: No apparent anesthesia complications

## 2019-09-23 NOTE — Discharge Instructions (Signed)

## 2019-09-23 NOTE — Anesthesia Preprocedure Evaluation (Addendum)
Anesthesia Evaluation  Patient identified by MRN, date of birth, ID band Patient awake    Reviewed: Allergy & Precautions, NPO status , Patient's Chart, lab work & pertinent test results  History of Anesthesia Complications Negative for: history of anesthetic complications  Airway Mallampati: II  TM Distance: >3 FB Neck ROM: Full    Dental no notable dental hx. (+) Dental Advisory Given   Pulmonary COPD,  COPD inhaler, former smoker,    Pulmonary exam normal        Cardiovascular hypertension, Pt. on medications Normal cardiovascular exam     Neuro/Psych negative neurological ROS  negative psych ROS   GI/Hepatic GERD  Medicated,(+) Hepatitis -  Endo/Other  negative endocrine ROS  Renal/GU negative Renal ROS     Musculoskeletal negative musculoskeletal ROS (+)   Abdominal   Peds  Hematology negative hematology ROS (+)   Anesthesia Other Findings Day of surgery medications reviewed with the patient.  Reproductive/Obstetrics                            Anesthesia Physical Anesthesia Plan  ASA: III  Anesthesia Plan: General   Post-op Pain Management: GA combined w/ Regional for post-op pain   Induction: Intravenous  PONV Risk Score and Plan: 2 and Ondansetron and Dexamethasone  Airway Management Planned: Oral ETT  Additional Equipment:   Intra-op Plan:   Post-operative Plan: Extubation in OR  Informed Consent: I have reviewed the patients History and Physical, chart, labs and discussed the procedure including the risks, benefits and alternatives for the proposed anesthesia with the patient or authorized representative who has indicated his/her understanding and acceptance.     Dental advisory given  Plan Discussed with: Anesthesiologist and CRNA  Anesthesia Plan Comments:        Anesthesia Quick Evaluation

## 2019-09-23 NOTE — Anesthesia Procedure Notes (Signed)
Procedure Name: Intubation Date/Time: 09/23/2019 2:02 PM Performed by: Anne Fu, CRNA Pre-anesthesia Checklist: Patient identified, Emergency Drugs available, Suction available, Patient being monitored and Timeout performed Patient Re-evaluated:Patient Re-evaluated prior to induction Oxygen Delivery Method: Circle system utilized Preoxygenation: Pre-oxygenation with 100% oxygen Induction Type: IV induction Ventilation: Mask ventilation without difficulty Laryngoscope Size: Mac and 4 Grade View: Grade III Tube type: Oral Tube size: 7.5 mm Number of attempts: 1 Airway Equipment and Method: Stylet Placement Confirmation: ETT inserted through vocal cords under direct vision,  positive ETCO2 and breath sounds checked- equal and bilateral Secured at: 21 cm Tube secured with: Tape Dental Injury: Teeth and Oropharynx as per pre-operative assessment  Difficulty Due To: Difficulty was anticipated, Difficult Airway- due to large tongue and Difficult Airway- due to anterior larynx

## 2019-09-23 NOTE — Anesthesia Procedure Notes (Signed)
Anesthesia Regional Block: Interscalene brachial plexus block   Pre-Anesthetic Checklist: ,, timeout performed, Correct Patient, Correct Site, Correct Laterality, Correct Procedure, Correct Position, site marked, Risks and benefits discussed,  Surgical consent,  Pre-op evaluation,  At surgeon's request and post-op pain management  Laterality: Left  Prep: chloraprep       Needles:  Injection technique: Single-shot  Needle Type: Echogenic Stimulator Needle     Needle Length: 5cm  Needle Gauge: 22     Additional Needles:   Narrative:  Start time: 09/23/2019 1:09 PM End time: 09/23/2019 1:19 PM Injection made incrementally with aspirations every 5 mL.  Performed by: Personally  Anesthesiologist: Heather Roberts, MD  Additional Notes: Functioning IV was confirmed and monitors applied.  A 37mm 22ga echogenic arrow stimulator was used. Sterile prep and drape,hand hygiene and sterile gloves were used.Ultrasound guidance: relevant anatomy identified, needle position confirmed, local anesthetic spread visualized around nerve(s)., vascular puncture avoided.  Image printed for medical record.  Negative aspiration and negative test dose prior to incremental administration of local anesthetic. The patient tolerated the procedure well.

## 2019-09-23 NOTE — Anesthesia Postprocedure Evaluation (Signed)
Anesthesia Post Note  Patient: [name]  Procedure(s) Performed: REVERSE SHOULDER ARTHROPLASTY (Left Shoulder)     Patient location during evaluation: PACU Anesthesia Type: General Level of consciousness: awake and alert Pain management: pain level controlled Vital Signs Assessment: post-procedure vital signs reviewed and stable Respiratory status: spontaneous breathing, nonlabored ventilation, respiratory function stable and patient connected to nasal cannula oxygen Cardiovascular status: blood pressure returned to baseline and stable Postop Assessment: no apparent nausea or vomiting Anesthetic complications: no    Last Vitals:  Vitals:   09/23/19 1900 09/23/19 1929  BP: 120/80 106/79  Pulse:  (!) 109  Resp:  (!) 22  Temp: 36.9 C 36.5 C  SpO2:  100%    Last Pain:  Vitals:   09/23/19 1929  TempSrc: Oral  PainSc:                  Norma Montemurro S

## 2019-09-23 NOTE — Progress Notes (Signed)
Assisted Dr. Singer with left, ultrasound guided, interscalene  block. Side rails up, monitors on throughout procedure. See vital signs in flow sheet. Tolerated Procedure well.  

## 2019-09-24 LAB — BASIC METABOLIC PANEL
Anion gap: 9 (ref 5–15)
BUN: 16 mg/dL (ref 8–23)
CO2: 21 mmol/L — ABNORMAL LOW (ref 22–32)
Calcium: 8.1 mg/dL — ABNORMAL LOW (ref 8.9–10.3)
Chloride: 104 mmol/L (ref 98–111)
Creatinine, Ser: 0.69 mg/dL (ref 0.61–1.24)
GFR calc Af Amer: >60 mL/min (ref >60)
GFR calc non Af Amer: >60 mL/min (ref >60)
Glucose, Bld: 146 mg/dL — ABNORMAL HIGH (ref 70–99)
Potassium: 4.8 mmol/L (ref 3.5–5.1)
Sodium: 134 mmol/L — ABNORMAL LOW (ref 135–145)

## 2019-09-24 LAB — CBC
HCT: 30.3 % — ABNORMAL LOW (ref 39.0–52.0)
Hemoglobin: 9.7 g/dL — ABNORMAL LOW (ref 13.0–17.0)
MCH: 33.8 pg (ref 26.0–34.0)
MCHC: 32 g/dL (ref 30.0–36.0)
MCV: 105.6 fL — ABNORMAL HIGH (ref 80.0–100.0)
Platelets: 203 10*3/uL (ref 150–400)
RBC: 2.87 MIL/uL — ABNORMAL LOW (ref 4.22–5.81)
RDW: 12.3 % (ref 11.5–15.5)
WBC: 5.3 10*3/uL (ref 4.0–10.5)
nRBC: 0 % (ref 0.0–0.2)

## 2019-09-24 MED ORDER — ORAL CARE MOUTH RINSE
15.0000 mL | Freq: Two times a day (BID) | OROMUCOSAL | Status: DC
  Administered 2019-09-24: 15 mL via OROMUCOSAL

## 2019-09-24 NOTE — Progress Notes (Addendum)
   PATIENT ID: [name]   1 Day Post-Op Procedure(s) (LRB): REVERSE SHOULDER ARTHROPLASTY (Left)  Subjective: Doing well, given IV bolus overnight and ativan to help HR, vitals wnl now. Reports min pain, slept well and ready for dc this am.  Objective:  Vitals:   09/24/19 0315 09/24/19 0658  BP: 109/64 119/76  Pulse: 89 94  Resp: (!) 22 (!) 22  Temp: 97.9 F (36.6 C) 97.8 F (36.6 C)  SpO2: 92% 95%     L shoulder dressing c/d/i Wiggles fingers/flex and ext wrist, distally NVI  Labs:  Recent Labs    09/23/19 1748 09/23/19 1751 09/24/19 0230  HGB 12.3* 12.1* 9.7*   Recent Labs    09/23/19 1751 09/24/19 0230  WBC 13.1* 5.3  RBC 3.47* 2.87*  HCT 37.0* 30.3*  PLT 269 203   Recent Labs    09/23/19 1751 09/24/19 0230  NA 137 134*  K 4.8 4.8  CL 102 104  CO2 25 21*  BUN 16 16  CREATININE 0.84 0.69  GLUCOSE 169* 146*  CALCIUM 8.6* 8.1*    Assessment and Plan: 1 day s/p left rev TSA for fx abla- expected po, asymptomatic Sling Continue pain mgmt D/c home today when ready, follow up with dr. Ave Filter in 2 weeks  VTE proph: asa, scds

## 2019-09-24 NOTE — Progress Notes (Signed)
MEWS score now green.  

## 2019-09-24 NOTE — Progress Notes (Signed)
Paged on call PA to inform/update about patient's vitals getting checked every hour for the first 4 hours (starting upon admission to unit), the yellow MEWS score, the MEWS protocol that has been being followed, and interventions implemented (IV Ativan given). Patient is alert and oriented and patient's girlfriend also said that patient sounded fine over the phone. Charge Nurse also made aware. Requested new orders. Minus Liberty, PA put in order for Normal Saline IV bolus to run at 500 ml/hr followed by Normal Saline to run at 100 ml/hr. Will continue to monitor patient.

## 2019-09-24 NOTE — Progress Notes (Signed)
Patient discharged to home with family. Given all belongings, instructions, equipment. Verbalized understanding of instructions. Escorted to pov via w/c.

## 2019-09-24 NOTE — Evaluation (Signed)
Occupational Therapy Evaluation Patient Details Name: [name] MRN: [id number] DOB: 19-Mar-1956 Today's Date: 09/24/2019    History of Present Illness LEFT REVERSE SHOULDER ARTHROPLASTY   Clinical Impression   OT eval and education complete. Handout provided.    Follow Up Recommendations  No OT follow up          Precautions / Restrictions Precautions Precautions: Shoulder Type of Shoulder Precautions: pt allowed to perform elbow, wrist and hand.  No shoulder movement allowed Shoulder Interventions: Shoulder sling/immobilizer Restrictions Weight Bearing Restrictions: Yes LUE Weight Bearing: Non weight bearing      Mobility Bed Mobility Overal bed mobility: Independent                Transfers Overall transfer level: Independent                        ADL either performed or assessed with clinical judgement                      Pertinent Vitals/Pain Pain Assessment: No/denies pain           Communication Communication Communication: No difficulties   Cognition Arousal/Alertness: Awake/alert Behavior During Therapy: WFL for tasks assessed/performed Overall Cognitive Status: Within Functional Limits for tasks assessed                                           Shoulder Instructions Shoulder Instructions Donning/doffing shirt without moving shoulder: Minimal assistance;Patient able to independently direct caregiver Method for sponge bathing under operated UE: Minimal assistance;Patient able to independently direct caregiver Donning/doffing sling/immobilizer: Minimal assistance;Patient able to independently direct caregiver Correct positioning of sling/immobilizer: Minimal assistance;Patient able to independently direct caregiver ROM for elbow, wrist and digits of operated UE: Minimal assistance;Patient able to independently direct caregiver Sling wearing schedule (on at all times/off for ADL's): Patient able to  independently direct caregiver;Supervision/safety Proper positioning of operated UE when showering: Minimal assistance;Patient able to independently direct caregiver Positioning of UE while sleeping: Minimal assistance;Patient able to independently direct caregiver    Home Living Family/patient expects to be discharged to:: Private residence Living Arrangements: Spouse/significant other Available Help at Discharge: Family Type of Home: House                                  Prior Functioning/Environment Level of Independence: Independent                          OT Goals(Current goals can be found in the care plan section) Acute Rehab OT Goals Patient Stated Goal: home today OT Goal Formulation: With patient  OT Frequency:      AM-PAC OT "6 Clicks" Daily Activity     Outcome Measure Help from another person eating meals?: None Help from another person taking care of personal grooming?: A Little Help from another person toileting, which includes using toliet, bedpan, or urinal?: A Little Help from another person bathing (including washing, rinsing, drying)?: A Little Help from another person to put on and taking off regular upper body clothing?: A Little Help from another person to put on and taking off regular lower body clothing?: A Little 6 Click Score: 19   End of Session Equipment Utilized During Treatment: Other (comment)(sling)  Activity Tolerance: Patient tolerated treatment well Patient left: in chair                   Time: 7169-6789 OT Time Calculation (min): 22 min Charges:  OT General Charges $OT Visit: 1 Visit OT Evaluation $OT Eval Low Complexity: 1 Low  Kari Baars, OT Acute Rehabilitation Services [phone] Office- [phone]     Keeleigh Terris, Edwena Felty D 09/24/2019, 10:43 AM

## 2019-09-25 ENCOUNTER — Encounter: Payer: Self-pay | Admitting: *Deleted

## 2019-09-30 NOTE — Discharge Summary (Signed)
Patient ID: [name] MRN: [id number] DOB/AGE: Apr 06, 1956 64 y.o.  Admit date: 09/23/2019 Discharge date: 09/30/2019  Admission Diagnoses:  Active Problems:   S/P reverse total shoulder arthroplasty, left   Discharge Diagnoses:  Same  Past Medical History:  Diagnosis Date  . COPD (chronic obstructive pulmonary disease) (HCC)   . GERD (gastroesophageal reflux disease)   . History of hepatitis C    treated several years ago through [place]  . Hypertension     Surgeries: Procedure(s): REVERSE SHOULDER ARTHROPLASTY on 09/23/2019   Consultants:   Discharged Condition: Improved  Hospital Course: [name] Brewer is an 64 y.o. male who was admitted 09/23/2019 for operative treatment of left proximal humerus fracture. Patient has severe unremitting pain that affects sleep, daily activities, and work/hobbies. After pre-op clearance the patient was taken to the operating room on 09/23/2019 and underwent  Procedure(s): REVERSE SHOULDER ARTHROPLASTY.    Patient was given perioperative antibiotics:  Anti-infectives (From admission, onward)   Start     Dose/Rate Route Frequency Ordered Stop   09/23/19 1930  ceFAZolin (ANCEF) IVPB 2g/100 mL premix     2 g 200 mL/hr over 30 Minutes Intravenous Every 6 hours 09/23/19 1926 09/24/19 1012   09/23/19 1130  ceFAZolin (ANCEF) IVPB 2g/100 mL premix  Status:  Discontinued     2 g 200 mL/hr over 30 Minutes Intravenous On call to O.R. 09/23/19 1125 09/23/19 1913       Patient was given sequential compression devices, early ambulation, and chemoprophylaxis to prevent DVT.  Patient benefited maximally from hospital stay and there were no complications.    Recent vital signs: No data found.   Recent laboratory studies: No results for input(s): WBC, HGB, HCT, PLT, NA, K, CL, CO2, BUN, CREATININE, GLUCOSE, INR, CALCIUM in the last 72 hours.  Invalid input(s): PT, 2   Discharge Medications:   Allergies as of 09/24/2019   No Known Allergies      Medication List    STOP taking these medications   ibuprofen 200 MG tablet Commonly known as: ADVIL     TAKE these medications   acetaminophen 500 MG tablet Commonly known as: TYLENOL Take 500-1,000 mg by mouth every 6 (six) hours as needed (for pain.).   Aflibercept 2 MG/0.05ML Soln Inject 1 Dose into the eye every 8 (eight) weeks.   aspirin EC 81 MG tablet Take 81 mg by mouth daily.   Fluticasone-Umeclidin-Vilant 100-62.5-25 MCG/INH Aepb Commonly known as: Trelegy Ellipta Inhale 1 puff into the lungs daily.   HYDROcodone-acetaminophen 5-325 MG tablet Commonly known as: NORCO/VICODIN Take 1 tablet by mouth every 6 (six) hours as needed for moderate pain.   ipratropium-albuterol 0.5-2.5 (3) MG/3ML Soln Commonly known as: DUONEB INHALE THE CONTENTS OF 1 VIAL (3 ML) VIANEBULIZER EVERY 6 HOURS AS NEEDED What changed: See the new instructions.   ketoconazole 2 % cream Commonly known as: NIZORAL Apply 1 application topically daily as needed for irritation.   lisinopril 20 MG tablet Commonly known as: ZESTRIL Take 20 mg by mouth daily.   multivitamin with minerals Tabs tablet Take 1 tablet by mouth daily.   oxyCODONE-acetaminophen 5-325 MG tablet Commonly known as: Percocet Take 1-2 tablets every 4 hours as needed for post operative pain. MAX 6/day   pantoprazole 40 MG tablet Commonly known as: PROTONIX Take 40 mg by mouth daily. What changed: Another medication with the same name was removed. Continue taking this medication, and follow the directions you see here.   pravastatin  20 MG tablet Commonly known as: PRAVACHOL Take 20 mg by mouth daily.   ProAir HFA 108 (90 Base) MCG/ACT inhaler Generic drug: albuterol INHALE 2 PUFFS INTO THE LUNGS EVERY 6 HOURS AS NEEDED FOR WHEEZING ORSHORTNESS OF BREATH. What changed: See the new instructions.   tiZANidine 4 MG tablet Commonly known as: Zanaflex Take 1 tablet (4 mg total) by mouth every 8 (eight) hours as needed  for muscle spasms.       Diagnostic Studies: DG Chest 2 View  Result Date: 09/19/2019 CLINICAL DATA:  COPD. Pre-op respiratory exam for left humerus fracture. EXAM: CHEST - 2 VIEW COMPARISON:  11/29/2017 FINDINGS: The heart size and mediastinal contours are within normal limits. Pulmonary hyperinflation is again seen, consistent with COPD. No evidence of pulmonary infiltrate or edema. No evidence of pleural effusion. Several old right upper rib fracture deformities are noted. IMPRESSION: COPD. No active cardiopulmonary disease. Electronically Signed   By: Marlaine Hind M.D.   On: 09/19/2019 16:47   DG Shoulder Left  Result Date: 09/23/2019 CLINICAL DATA:  Shoulder fracture. EXAM: LEFT SHOULDER - 2+ VIEW; DG C-ARM 1-60 MIN-NO REPORT COMPARISON:  Outside x-rays 09/16/2019 FINDINGS: Fracture proximal humerus. Humeral head has been resected and a shoulder replacement been performed. Cerclage wires around the proximal humeral fracture. IMPRESSION: Left shoulder replacement for humeral fracture. Electronically Signed   By: Franchot Gallo M.D.   On: 09/23/2019 16:38   DG Shoulder Left Port  Result Date: 09/23/2019 CLINICAL DATA:  Status post reverse shoulder arthroplasty EXAM: LEFT SHOULDER COMPARISON:  Intraop films from earlier in the same day. FINDINGS: Left shoulder arthroplasty is noted. Proximal left humeral fracture is seen. Cerclage wires are noted in place IMPRESSION: Status post left shoulder arthroplasty Electronically Signed   By: Inez Catalina M.D.   On: 09/23/2019 18:56   DG C-Arm 1-60 Min-No Report  Result Date: 09/23/2019 Fluoroscopy was utilized by the requesting physician.  No radiographic interpretation.    Disposition: Discharge disposition: 01-Home or Self Care       Discharge Instructions    Call MD / Call 911   Complete by: As directed    If you experience chest pain or shortness of breath, CALL 911 and be transported to the hospital emergency room.  If you develope a  fever above 101 F, pus (white drainage) or increased drainage or redness at the wound, or calf pain, call your surgeon's office.   Call MD / Call 911   Complete by: As directed    If you experience chest pain or shortness of breath, CALL 911 and be transported to the hospital emergency room.  If you develope a fever above 101 F, pus (white drainage) or increased drainage or redness at the wound, or calf pain, call your surgeon's office.   Constipation Prevention   Complete by: As directed    Drink plenty of fluids.  Prune juice may be helpful.  You may use a stool softener, such as Colace (over the counter) 100 mg twice a day.  Use MiraLax (over the counter) for constipation as needed.   Constipation Prevention   Complete by: As directed    Drink plenty of fluids.  Prune juice may be helpful.  You may use a stool softener, such as Colace (over the counter) 100 mg twice a day.  Use MiraLax (over the counter) for constipation as needed.   Diet - low sodium heart healthy   Complete by: As directed    Diet - low  sodium heart healthy   Complete by: As directed    Increase activity slowly as tolerated   Complete by: As directed    Increase activity slowly as tolerated   Complete by: As directed       Follow-up Information    Jones Broom, MD. Schedule an appointment as soon as possible for a visit in 2 week(s).   Specialty: Orthopedic Surgery Contact information: [address] [place] [address] [phone]            Signed: Jiles Harold 09/30/2019, 3:43 PM

## 2019-10-16 ENCOUNTER — Encounter: Payer: Self-pay | Admitting: Internal Medicine

## 2019-10-16 ENCOUNTER — Ambulatory Visit: Payer: Commercial Managed Care - PPO | Admitting: Internal Medicine

## 2019-10-16 ENCOUNTER — Other Ambulatory Visit: Payer: Self-pay

## 2019-10-16 DIAGNOSIS — I7090 Unspecified atherosclerosis: Secondary | ICD-10-CM | POA: Insufficient documentation

## 2019-10-16 DIAGNOSIS — I2510 Atherosclerotic heart disease of native coronary artery without angina pectoris: Secondary | ICD-10-CM | POA: Diagnosis not present

## 2019-10-16 DIAGNOSIS — J449 Chronic obstructive pulmonary disease, unspecified: Secondary | ICD-10-CM | POA: Diagnosis not present

## 2019-10-16 DIAGNOSIS — I2584 Coronary atherosclerosis due to calcified coronary lesion: Secondary | ICD-10-CM | POA: Diagnosis not present

## 2019-10-16 DIAGNOSIS — R918 Other nonspecific abnormal finding of lung field: Secondary | ICD-10-CM | POA: Diagnosis not present

## 2019-10-16 MED ORDER — TRELEGY ELLIPTA 100-62.5-25 MCG/INH IN AEPB: 1.0000 | INHALATION_SPRAY | Freq: Every day | RESPIRATORY_TRACT | 12 refills | Status: DC

## 2019-10-16 MED ORDER — PROAIR HFA 108 (90 BASE) MCG/ACT IN AERS: INHALATION_SPRAY | RESPIRATORY_TRACT | 12 refills | Status: DC

## 2019-10-16 MED ORDER — IPRATROPIUM-ALBUTEROL 0.5-2.5 (3) MG/3ML IN SOLN: RESPIRATORY_TRACT | 12 refills | Status: DC

## 2019-10-16 NOTE — Assessment & Plan Note (Signed)
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

## 2019-10-16 NOTE — Assessment & Plan Note (Signed)
Clinically stable without exacerbation. Plan- refill meds

## 2019-10-16 NOTE — Patient Instructions (Addendum)
Med refills sent  Keep appointment for CT chest on 1/29 as planned- 1 year follow-up  Please call if we can help

## 2019-10-16 NOTE — Progress Notes (Signed)
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 Despite repetitive coaching he did not generate acceptable curves.  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  Fortunately breathing has been stable.  He expects an episodic acute bronchitis every 4 to 5 months, often requiring prednisone.  Most recent one he was able to interrupt with home nebulizer machine use and he feels stable at baseline now.  Denies discolored sputum, blood, adenopathy, fever. Discussed trying to combine his bronchodilators into a single Trelegy device. CT results reviewed with him. CT chest 10/11/2018- IMPRESSION: 1. No suspicious pulmonary nodules or masses are noted on today's examination. 2. There continues to be a few scattered 2-4 mm pulmonary nodules in the periphery of the lungs bilaterally. These are nonspecific, but strongly favored to reflect benign areas of mucoid impaction within terminal bronchioles. No follow-up needed if patient is low-risk (and has no known or suspected primary neoplasm). Non-contrast chest CT can be considered in 12 months if patient is high-risk. This recommendation follows the consensus statement: Guidelines  for Management of Incidental Pulmonary Nodules Detected on CT Images: From the Fleischner Society 2017; Radiology 2017; 284:228-243. 3. Aortic atherosclerosis, in addition to left anterior descending coronary artery disease. Please note that although the presence of coronary artery calcium documents the presence of coronary artery disease, the severity of this disease and any potential stenosis cannot be assessed on this non-gated CT examination. Assessment for potential risk factor modification, dietary therapy or pharmacologic therapy may be warranted, if clinically indicated. 4. Mild diffuse bronchial wall thickening with mild centrilobular and paraseptal emphysema; imaging findings suggestive of underlying COPD. 5. Hepatic steatosis. Aortic Atherosclerosis (ICD10-I70.0) and Emphysema (ICD10-J43.9).  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 Originally prescribed by his eye doctor for macular degeneration. Pending CT chest 10/18/19- Reminded Now laid off. Breathing ok, needing refills.  Girlfriend tells him he snores loudly but hasn't mentioned apnea and he doesn't notice daytime tiredness.  ROS-see HPI + = positive Constitutional:   No-   weight loss, night sweats, fevers, chills, fatigue, lassitude. HEENT:   No-  headaches, difficulty swallowing, tooth/dental problems, sore throat,       No-  sneezing, itching, ear ache, nasal congestion, post nasal drip,  CV:  No-   chest pain, orthopnea, PND, swelling in lower extremities, anasarca,                                                 dizziness, palpitations Resp: +  shortness of breath with exertion or at rest.               productive cough,  + non-productive cough,  No- coughing up of blood.          change in  color of mucus.  No- wheezing.  +snores Skin: No-   rash or lesions. GI:  No-   heartburn, indigestion, abdominal pain, nausea, vomiting,  GU:  MS:  No-   joint pain or swelling.  . Neuro-     nothing unusual Psych:  No- change in mood or affect. No depression or anxiety.  No memory loss.  OBJ- Physical Exam General- Alert, Oriented, Affect-appropriate, Distress- none acute Skin-  Lymphadenopathy- none Head- atraumatic            Eyes- Gross vision intact, PERRLA, conjunctivae and secretions clear            Ears- Hearing, canals-normal            Nose- Clear, no-Septal dev, mucus, polyps, erosion, perforation             Throat- Mallampati III-IV , mucosa clear , drainage- none, tonsils- atrophic Neck- flexible , trachea midline, no stridor , thyroid nl, carotid no bruit Chest - symmetrical excursion , unlabored           Heart/CV- RRR , no murmur , no gallop  , no rub, nl s1 s2                           - JVD- none , edema- none, stasis changes- none, varices- none           Lung- +clear but distant, wheeze- none, cough- none, dullness-none, rub- none           Chest wall-  Abd- tender-no, distended-no, bowel sounds-present, HSM- no Br/ Gen/ Rectal- Not done, not indicated Extrem- cyanosis- none, clubbing, none, atrophy- none, strength- nl,             +Nails bitten short. + L arm in sling. Neuro-+ tremor hands and mild head-bob

## 2019-10-16 NOTE — Assessment & Plan Note (Signed)
For f/u discussion w PCP

## 2019-10-18 ENCOUNTER — Ambulatory Visit (INDEPENDENT_AMBULATORY_CARE_PROVIDER_SITE_OTHER)
Admission: RE | Admit: 2019-10-18 | Discharge: 2019-10-18 | Disposition: A | Discharge status: Home / Self Care | Payer: Commercial Managed Care - PPO | Source: Ambulatory Visit | Attending: Internal Medicine | Admitting: Internal Medicine

## 2019-10-18 ENCOUNTER — Other Ambulatory Visit: Payer: Self-pay

## 2019-10-18 DIAGNOSIS — R918 Other nonspecific abnormal finding of lung field: Secondary | ICD-10-CM | POA: Diagnosis not present

## 2019-12-03 ENCOUNTER — Telehealth: Payer: Self-pay | Admitting: Internal Medicine

## 2019-12-03 NOTE — Telephone Encounter (Signed)
ATC patient on home and mobile numbers.  LM to call back.

## 2019-12-03 NOTE — Telephone Encounter (Signed)
Patient is returning phone call. Patient phone number is [phone].

## 2019-12-03 NOTE — Telephone Encounter (Signed)
lmtcb for pt.  

## 2019-12-04 MED ORDER — AZITHROMYCIN 250 MG PO TABS: ORAL_TABLET | ORAL | 0 refills | Status: DC

## 2019-12-04 MED ORDER — PREDNISONE 10 MG PO TABS: 20.0000 mg | ORAL_TABLET | Freq: Every day | ORAL | 0 refills | Status: AC

## 2019-12-04 NOTE — Telephone Encounter (Signed)
Pt called back-- correct pharmacy info is [facility] [phone]-- pt requests that you call him and leave vmail to let him know prescription has been called in

## 2019-12-04 NOTE — Telephone Encounter (Signed)
Pt returned call. Pt c/o increase in SOB, prod cough with grey mucus, fatigued x 3 days. Pt taking Trelegy daily. Pt used albuterol hfa BID on 3/16, no use of rescue inhaler today. Pt denies CP/tightness, f/c/s. Pt refused virtual visit.   Dr. Maple Hudson please advise. Thanks.   No Known Allergies  Current Outpatient Medications on File Prior to Visit  Medication Sig Dispense Refill  . acetaminophen (TYLENOL) 500 MG tablet Take 500-1,000 mg by mouth every 6 (six) hours as needed (for pain.).    Marland Kitchen Aflibercept 2 MG/0.05ML SOLN Inject 1 Dose into the eye every 8 (eight) weeks.     Marland Kitchen aspirin EC 81 MG tablet Take 81 mg by mouth daily.    . Fluticasone-Umeclidin-Vilant (TRELEGY ELLIPTA) 100-62.5-25 MCG/INH AEPB Inhale 1 puff into the lungs daily. Rinse mouth 60 each 12  . ipratropium-albuterol (DUONEB) 0.5-2.5 (3) MG/3ML SOLN INHALE THE CONTENTS OF 1 VIAL (3 ML) VIANEBULIZER EVERY 6 HOURS AS NEEDED 180 mL 12  . ketoconazole (NIZORAL) 2 % cream Apply 1 application topically daily as needed for irritation.     Marland Kitchen lisinopril (PRINIVIL,ZESTRIL) 20 MG tablet Take 20 mg by mouth daily.     . Multiple Vitamin (MULTIVITAMIN WITH MINERALS) TABS tablet Take 1 tablet by mouth daily.    Marland Kitchen oxyCODONE-acetaminophen (PERCOCET) 5-325 MG tablet Take 1-2 tablets every 4 hours as needed for post operative pain. MAX 6/day 30 tablet 0  . pantoprazole (PROTONIX) 40 MG tablet Take 40 mg by mouth daily.    . pravastatin (PRAVACHOL) 20 MG tablet Take 20 mg by mouth daily.    Marland Kitchen PROAIR HFA 108 (90 Base) MCG/ACT inhaler INHALE 2 PUFFS INTO THE LUNGS EVERY 6 HOURS AS NEEDED FOR WHEEZING ORSHORTNESS OF BREATH. 18 g 12  . tiZANidine (ZANAFLEX) 4 MG tablet Take 1 tablet (4 mg total) by mouth every 8 (eight) hours as needed for muscle spasms. 30 tablet 1   Current Facility-Administered Medications on File Prior to Visit  Medication Dose Route Frequency Provider Last Rate Last Admin  . triamcinolone acetonide (KENALOG) 10 MG/ML injection  10 mg  10 mg Other Once Lenn Sink, DPM

## 2019-12-04 NOTE — Telephone Encounter (Signed)
lmtcb for pt.    Dr. Maple Hudson, we havent been able to speak with pt. Per the front staff the pt is having increase in SOB and prod cough with grey mucus. Pt declined a virtual visit, can pt come into office for visit? Thanks.

## 2019-12-04 NOTE — Telephone Encounter (Signed)
Called and left patient a detailed message in regards to what prescriptions have been called in. Prescriptions have been sent.   Will close encounter.

## 2019-12-04 NOTE — Telephone Encounter (Signed)
Suggest we offer prednisone 20 mg, # 5, 1 daily                            And Zpak 250 mg, # 6,  2 today then one daily 

## 2019-12-04 NOTE — Telephone Encounter (Signed)
Patient is returning phone call. Patient phone number is [phone]. 

## 2019-12-04 NOTE — Telephone Encounter (Signed)
Left message for patient to call back. Will need to speak with him before calling in the medications.

## 2019-12-30 ENCOUNTER — Other Ambulatory Visit: Payer: Self-pay | Admitting: Primary Care

## 2019-12-31 NOTE — Telephone Encounter (Signed)
Not sure why the pharmacy sent to [facility] refill pool.

## 2020-01-28 ENCOUNTER — Ambulatory Visit (INDEPENDENT_AMBULATORY_CARE_PROVIDER_SITE_OTHER): Payer: Commercial Managed Care - PPO | Admitting: Ophthalmology

## 2020-01-28 ENCOUNTER — Other Ambulatory Visit: Payer: Self-pay

## 2020-01-28 ENCOUNTER — Encounter (INDEPENDENT_AMBULATORY_CARE_PROVIDER_SITE_OTHER): Payer: Self-pay | Admitting: Ophthalmology

## 2020-01-28 DIAGNOSIS — H35712 Central serous chorioretinopathy, left eye: Secondary | ICD-10-CM | POA: Insufficient documentation

## 2020-01-28 DIAGNOSIS — H4052X3 Glaucoma secondary to other eye disorders, left eye, severe stage: Secondary | ICD-10-CM | POA: Diagnosis not present

## 2020-01-28 DIAGNOSIS — H353212 Exudative age-related macular degeneration, right eye, with inactive choroidal neovascularization: Secondary | ICD-10-CM | POA: Diagnosis not present

## 2020-01-28 DIAGNOSIS — H353221 Exudative age-related macular degeneration, left eye, with active choroidal neovascularization: Secondary | ICD-10-CM

## 2020-01-28 DIAGNOSIS — H353211 Exudative age-related macular degeneration, right eye, with active choroidal neovascularization: Secondary | ICD-10-CM | POA: Insufficient documentation

## 2020-01-28 NOTE — Assessment & Plan Note (Signed)
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  Forms of therapy reviewed include the use of Anti-VEGF medications injected painlessly into the eye, as well as other possible treatment modalities, including thermal laser therapy. Fellow eye involvement and risks were discussed with the patient. 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 The treatment regimen is on a treat as needed basis with the intent to treat if necessary and extend interval of exams when possible. On average 1 out of 6 patients do not need lifetime therapy. However, the risk of recurrent disease is high for a lifetime.  Initially monthly, then periodic, examinations and evaluations will determine whether the next treatment is required on the day of the examination.  OS, has been stable in the past with chronic antiveg F therapy.  Follow-up examination in 3 months however anatomy has worsened OS.  Will need to repeat restart and repeat intravitreal Eylea commence next week

## 2020-01-28 NOTE — Progress Notes (Signed)
01/28/2020     CHIEF COMPLAINT Patient presents for Retina Follow Up   HISTORY OF PRESENT ILLNESS: [name] is a 64 y.o. male who presents to the clinic today for:   HPI    Retina Follow Up    Patient presents with  Wet AMD.  In both eyes.  Duration of 3 months.  Since onset it is stable.          Comments    3 month follow up - OCT OU Patient denies change in vision and overall has no complaints.        Last edited by Gerda Diss on 01/28/2020  1:22 PM. (History)      Referring physician: Elby Beck, [place],  [place] [address]  HISTORICAL INFORMATION:   Selected notes from the MEDICAL RECORD NUMBER    Lab Results  Component Value Date   HGBA1C 5.4 08/09/2019     CURRENT MEDICATIONS: Current Outpatient Medications (Ophthalmic Drugs)  Medication Sig  . Aflibercept 2 MG/0.05ML SOLN Inject 1 Dose into the eye every 8 (eight) weeks.    No current facility-administered medications for this visit. (Ophthalmic Drugs)   Current Outpatient Medications (Other)  Medication Sig  . acetaminophen (TYLENOL) 500 MG tablet Take 500-1,000 mg by mouth every 6 (six) hours as needed (for pain.).  Marland Kitchen aspirin EC 81 MG tablet Take 81 mg by mouth daily.  Marland Kitchen azithromycin (ZITHROMAX) 250 MG tablet Take 2 tablets on first day, then 1 tablet daily until finished.  . Fluticasone-Umeclidin-Vilant (TRELEGY ELLIPTA) 100-62.5-25 MCG/INH AEPB Inhale 1 puff into the lungs daily. Rinse mouth  . ipratropium-albuterol (DUONEB) 0.5-2.5 (3) MG/3ML SOLN INHALE THE CONTENTS OF 1 VIAL (3 ML) VIANEBULIZER EVERY 6 HOURS AS NEEDED  . ketoconazole (NIZORAL) 2 % cream Apply 1 application topically daily as needed for irritation.   Marland Kitchen lisinopril (ZESTRIL) 20 MG tablet TAKE 1 TABLET BY MOUTH ONCE A DAY  . Multiple Vitamin (MULTIVITAMIN WITH MINERALS) TABS tablet Take 1 tablet by mouth daily.  Marland Kitchen oxyCODONE-acetaminophen (PERCOCET) 5-325 MG tablet Take 1-2 tablets every 4  hours as needed for post operative pain. MAX 6/day  . pantoprazole (PROTONIX) 40 MG tablet Take 40 mg by mouth daily.  . pravastatin (PRAVACHOL) 20 MG tablet Take 20 mg by mouth daily.  Marland Kitchen PROAIR HFA 108 (90 Base) MCG/ACT inhaler INHALE 2 PUFFS INTO THE LUNGS EVERY 6 HOURS AS NEEDED FOR WHEEZING ORSHORTNESS OF BREATH.  Marland Kitchen tiZANidine (ZANAFLEX) 4 MG tablet Take 1 tablet (4 mg total) by mouth every 8 (eight) hours as needed for muscle spasms.   Current Facility-Administered Medications (Other)  Medication Route  . triamcinolone acetonide (KENALOG) 10 MG/ML injection 10 mg Other      REVIEW OF SYSTEMS:    ALLERGIES No Known Allergies  PAST MEDICAL HISTORY Past Medical History:  Diagnosis Date  . COPD (chronic obstructive pulmonary disease) ([place])   . GERD (gastroesophageal reflux disease)   . History of hepatitis C    treated several years ago through [place]  . Hypertension    Past Surgical History:  Procedure Laterality Date  . CATARACT EXTRACTION, BILATERAL    . EYE SURGERY Bilateral   . ORTHOPEDIC SURGERY Bilateral    pt states fracture repairs to arms and legs.  Marland Kitchen REVERSE SHOULDER ARTHROPLASTY Left 09/23/2019   Procedure: REVERSE SHOULDER ARTHROPLASTY;  Surgeon: Tania Ade, MD;  Location: [facility];  Service: Orthopedics;  Laterality: Left;    FAMILY  HISTORY Family History  Problem Relation Age of Onset  . Heart disease Mother   . Hypertension Father     SOCIAL HISTORY Social History   Tobacco Use  . Smoking status: Former Smoker    Packs/day: 1.00    Years: 35.00    Pack years: 35.00    Types: Cigarettes    Quit date: 10/01/2010    Years since quitting: 9.3  . Smokeless tobacco: Never Used  Substance Use Topics  . Alcohol use: Yes    Alcohol/week: 42.0 standard drinks    Types: 42 Cans of beer per week    Comment: daily - 6 drinks a day  . Drug use: Yes    Types: Marijuana    Comment: last marijuana 2-3 weeks ago         OPHTHALMIC EXAM:  Base Eye  Exam    Visual Acuity (Snellen - Linear)      Right Left   Dist [place] 20/30-2 20/200-1   Dist ph [place] 20/25-2 20/100-1       Tonometry (Tonopen, 1:28 PM)      Right Left   Pressure 16 14       Pupils      Pupils Dark Light Shape React APD   Right PERRL 3 2 Round Brisk None   Left PERRL 3 2 Round Brisk None       Visual Fields (Counting fingers)      Left Right    Full Full       Extraocular Movement      Right Left    Full Full       Neuro/Psych    Oriented x3: Yes   Mood/Affect: Normal       Dilation    Both eyes: 1.0% Mydriacyl, 2.5% Phenylephrine @ 1:28 PM        Slit Lamp and Fundus Exam    External Exam      Right Left   External Normal Normal       Slit Lamp Exam      Right Left   Lids/Lashes Normal Normal   Conjunctiva/Sclera White and quiet White and quiet   Cornea Clear Clear   Anterior Chamber Deep and quiet Deep and quiet   Iris Round and reactive Round and reactive   Lens Posterior chamber intraocular lens Posterior chamber intraocular lens   Anterior Vitreous Normal Normal       Fundus Exam      Right Left   Posterior Vitreous Vitrectomized Normal   Disc Normal Normal   C/D Ratio 0.3 0.3   Macula Retinal pigment epithelial mottling Macular thickening, Retinal pigment epithelial mottling, Retinal pigment epithelial detachment   Vessels Normal Normal   Periphery Normal Normal          IMAGING AND PROCEDURES  Imaging and Procedures for 01/28/20  OCT, Retina - OU - Both Eyes       Right Eye Quality was good. Scan locations included subfoveal. Central Foveal Thickness: 257. Progression has been stable. Findings include abnormal foveal contour, outer retinal atrophy.   Left Eye Quality was good. Scan locations included subfoveal. Central Foveal Thickness: 376. Progression has worsened. Findings include abnormal foveal contour, subretinal fluid, pigment epithelial detachment, subretinal scarring, subretinal hyper-reflective material.    Notes OD, overall stable.  Some outer retinal atrophy.  No signs of CNVM.   Increased subretinal fluid with and subretinal hyper reflective material, significant eyes progression of prior CNVM.  Today at a 63-month interval.  ASSESSMENT/PLAN:  Exudative age-related macular degeneration of left eye with active choroidal neovascularization (HCC) The nature of wet macular degeneration was discussed with the patient.  Forms of therapy reviewed include the use of Anti-VEGF medications injected painlessly into the eye, as well as other possible treatment modalities, including thermal laser therapy. Fellow eye involvement and risks were discussed with the patient. 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 The treatment regimen is on a treat as needed basis with the intent to treat if necessary and extend interval of exams when possible. On average 1 out of 6 patients do not need lifetime therapy. However, the risk of recurrent disease is high for a lifetime.  Initially monthly, then periodic, examinations and evaluations will determine whether the next treatment is required on the day of the examination.  OS, has been stable in the past with chronic antiveg F therapy.  Follow-up examination in 3 months however anatomy has worsened OS.  Will need to repeat restart and repeat intravitreal Eylea commence next week      ICD-10-CM   1. Exudative age-related macular degeneration of left eye with active choroidal neovascularization (HCC)  H35.3221 OCT, Retina - OU - Both Eyes  2. Neovascular glaucoma of left eye, severe stage  H40.52X3   3. Central serous chorioretinopathy of left eye  H35.712 OCT, Retina - OU - Both Eyes  4. Exudative age-related macular degeneration of right eye with inactive choroidal neovascularization (HCC)  H35.3212 OCT, Retina - OU - Both Eyes    1.  2.  3.  Ophthalmic Meds Ordered this visit:  No orders of the  defined types were placed in this encounter.      No follow-ups on file.  There are no Patient Instructions on file for this visit.   Explained the diagnoses, plan, and follow up with the patient and they expressed understanding.  Patient expressed understanding of the importance of proper follow up care.   Alford Highland Ariq Khamis M.D. Diseases & Surgery of the Retina and Vitreous [facility] 01/28/20     Abbreviations: M myopia (nearsighted); A astigmatism; H hyperopia (farsighted); P presbyopia; Mrx spectacle prescription;  CTL contact lenses; OD right eye; OS left eye; OU both eyes  XT exotropia; ET esotropia; PEK punctate epithelial keratitis; PEE punctate epithelial erosions; DES dry eye syndrome; MGD meibomian gland dysfunction; ATs artificial tears; PFAT's preservative free artificial tears; NSC nuclear sclerotic cataract; PSC posterior subcapsular cataract; ERM epi-retinal membrane; PVD posterior vitreous detachment; RD retinal detachment; DM diabetes mellitus; DR diabetic retinopathy; NPDR non-proliferative diabetic retinopathy; PDR proliferative diabetic retinopathy; CSME clinically significant macular edema; DME diabetic macular edema; dbh dot blot hemorrhages; CWS cotton wool spot; POAG primary open angle glaucoma; C/D cup-to-disc ratio; HVF humphrey visual field; GVF goldmann visual field; OCT optical coherence tomography; IOP intraocular pressure; BRVO Branch retinal vein occlusion; CRVO central retinal vein occlusion; CRAO central retinal artery occlusion; BRAO branch retinal artery occlusion; RT retinal tear; SB scleral buckle; PPV pars plana vitrectomy; VH Vitreous hemorrhage; PRP panretinal laser photocoagulation; IVK intravitreal kenalog; VMT vitreomacular traction; MH Macular hole;  NVD neovascularization of the disc; NVE neovascularization elsewhere; AREDS age related eye disease study; ARMD age related macular degeneration; POAG primary open angle glaucoma; EBMD  epithelial/anterior basement membrane dystrophy; ACIOL anterior chamber intraocular lens; IOL intraocular lens; PCIOL posterior chamber intraocular lens; Phaco/IOL phacoemulsification with intraocular lens placement; PRK photorefractive keratectomy; LASIK laser assisted in situ keratomileusis; HTN hypertension; DM diabetes mellitus;  COPD chronic obstructive pulmonary disease

## 2020-02-05 ENCOUNTER — Other Ambulatory Visit: Payer: Self-pay

## 2020-02-05 ENCOUNTER — Encounter (INDEPENDENT_AMBULATORY_CARE_PROVIDER_SITE_OTHER): Payer: Self-pay | Admitting: Ophthalmology

## 2020-02-05 ENCOUNTER — Ambulatory Visit (INDEPENDENT_AMBULATORY_CARE_PROVIDER_SITE_OTHER): Payer: Commercial Managed Care - PPO | Admitting: Ophthalmology

## 2020-02-05 DIAGNOSIS — H353221 Exudative age-related macular degeneration, left eye, with active choroidal neovascularization: Secondary | ICD-10-CM | POA: Diagnosis not present

## 2020-02-05 MED ORDER — AFLIBERCEPT 2MG/0.05ML IZ SOLN FOR KALEIDOSCOPE
2.0000 mg | INTRAVITREAL | Status: AC | PRN
  Administered 2020-02-05: 2 mg via INTRAVITREAL

## 2020-02-05 NOTE — Progress Notes (Signed)
+   02/05/2020     CHIEF COMPLAINT Patient presents for Retina Follow Up   HISTORY OF PRESENT ILLNESS: [name] is a 64 y.o. male who presents to the clinic today for:   HPI    Retina Follow Up    Patient presents with  Wet AMD.  In left eye.  This started 1 week ago.  Severity is mild.  Duration of 1 week.  Since onset it is stable.          Comments    1 Week AMD F/U OS, Eylea OS, no dilation  Pt denies noticeable changes to [place] OU since last visit. Pt denies ocular pain, flashes of light, or floaters OU.         Last edited by Ileana Roup, COA on 02/05/2020  9:45 AM. (History)      Referring physician: Emi Belfast, FNP [address] [place],  [place] [address]  HISTORICAL INFORMATION:   Selected notes from the MEDICAL RECORD NUMBER    Lab Results  Component Value Date   HGBA1C 5.4 08/09/2019     CURRENT MEDICATIONS: Current Outpatient Medications (Ophthalmic Drugs)  Medication Sig  . Aflibercept 2 MG/0.05ML SOLN Inject 1 Dose into the eye every 8 (eight) weeks.    No current facility-administered medications for this visit. (Ophthalmic Drugs)   Current Outpatient Medications (Other)  Medication Sig  . acetaminophen (TYLENOL) 500 MG tablet Take 500-1,000 mg by mouth every 6 (six) hours as needed (for pain.).  Marland Kitchen aspirin EC 81 MG tablet Take 81 mg by mouth daily.  Marland Kitchen azithromycin (ZITHROMAX) 250 MG tablet Take 2 tablets on first day, then 1 tablet daily until finished.  . Fluticasone-Umeclidin-Vilant (TRELEGY ELLIPTA) 100-62.5-25 MCG/INH AEPB Inhale 1 puff into the lungs daily. Rinse mouth  . ipratropium-albuterol (DUONEB) 0.5-2.5 (3) MG/3ML SOLN INHALE THE CONTENTS OF 1 VIAL (3 ML) VIANEBULIZER EVERY 6 HOURS AS NEEDED  . ketoconazole (NIZORAL) 2 % cream Apply 1 application topically daily as needed for irritation.   Marland Kitchen lisinopril (ZESTRIL) 20 MG tablet TAKE 1 TABLET BY MOUTH ONCE A DAY  . Multiple Vitamin (MULTIVITAMIN WITH MINERALS) TABS  tablet Take 1 tablet by mouth daily.  Marland Kitchen oxyCODONE-acetaminophen (PERCOCET) 5-325 MG tablet Take 1-2 tablets every 4 hours as needed for post operative pain. MAX 6/day  . pantoprazole (PROTONIX) 40 MG tablet Take 40 mg by mouth daily.  . pravastatin (PRAVACHOL) 20 MG tablet Take 20 mg by mouth daily.  Marland Kitchen PROAIR HFA 108 (90 Base) MCG/ACT inhaler INHALE 2 PUFFS INTO THE LUNGS EVERY 6 HOURS AS NEEDED FOR WHEEZING ORSHORTNESS OF BREATH.  Marland Kitchen tiZANidine (ZANAFLEX) 4 MG tablet Take 1 tablet (4 mg total) by mouth every 8 (eight) hours as needed for muscle spasms.   Current Facility-Administered Medications (Other)  Medication Route  . triamcinolone acetonide (KENALOG) 10 MG/ML injection 10 mg Other      REVIEW OF SYSTEMS:    ALLERGIES No Known Allergies  PAST MEDICAL HISTORY Past Medical History:  Diagnosis Date  . COPD (chronic obstructive pulmonary disease) (HCC)   . GERD (gastroesophageal reflux disease)   . History of hepatitis C    treated several years ago through [place]  . Hypertension    Past Surgical History:  Procedure Laterality Date  . CATARACT EXTRACTION, BILATERAL    . EYE SURGERY Bilateral   . ORTHOPEDIC SURGERY Bilateral    pt states fracture repairs to arms and legs.  Marland Kitchen REVERSE SHOULDER ARTHROPLASTY Left  09/23/2019   Procedure: REVERSE SHOULDER ARTHROPLASTY;  Surgeon: Jones Broom, MD;  Location: [facility];  Service: Orthopedics;  Laterality: Left;    FAMILY HISTORY Family History  Problem Relation Age of Onset  . Heart disease Mother   . Hypertension Father     SOCIAL HISTORY Social History   Tobacco Use  . Smoking status: Former Smoker    Packs/day: 1.00    Years: 35.00    Pack years: 35.00    Types: Cigarettes    Quit date: 10/01/2010    Years since quitting: 9.3  . Smokeless tobacco: Never Used  Substance Use Topics  . Alcohol use: Yes    Alcohol/week: 42.0 standard drinks    Types: 42 Cans of beer per week    Comment: daily - 6 drinks a day  .  Drug use: Yes    Types: Marijuana    Comment: last marijuana 2-3 weeks ago         OPHTHALMIC EXAM:  Base Eye Exam    Visual Acuity (ETDRS)      Right Left   Dist [place] 20/30 +1 20/200   Dist ph [place] 20/25 -2 NI       Tonometry (Tonopen, 9:49 AM)      Right Left   Pressure 13 13       Pupils      Pupils Dark Light Shape React APD   Right PERRL 4 3 Round Brisk None   Left PERRL 4 3 Round Brisk None       Visual Fields (Counting fingers)      Left Right    Full Full       Extraocular Movement      Right Left    Full Full       Neuro/Psych    Oriented x3: Yes   Mood/Affect: Normal       Dilation   Deferred today        Slit Lamp and Fundus Exam    External Exam      Right Left   External Normal Normal       Slit Lamp Exam      Right Left   Lids/Lashes Normal Normal   Conjunctiva/Sclera White and quiet White and quiet   Cornea Clear Clear   Anterior Chamber Deep and quiet Deep and quiet   Iris Round and reactive Round and reactive   Lens Posterior chamber intraocular lens Posterior chamber intraocular lens   Anterior Vitreous Normal Normal          IMAGING AND PROCEDURES  Imaging and Procedures for @TODAY @  OCT, Retina - OU - Both Eyes       Right Eye Quality was good. Central Foveal Thickness: 252. Progression has been stable. Findings include outer retinal atrophy.   Left Eye Quality was good. Scan locations included subfoveal. Central Foveal Thickness: 374. Progression has been stable. Findings include abnormal foveal contour, intraretinal fluid, subretinal fluid.   Notes Eylea OS today       Intravitreal Injection, Pharmacologic Agent - OS - Left Eye       Time Out 02/05/2020. 10:22 AM. Confirmed correct patient, procedure, site, and patient consented.   Anesthesia Topical anesthesia was used. Anesthetic medications included [place] 3.5%.   Procedure Preparation included Ofloxacin , 10% betadine to eyelids.   Injection:  2  mg aflibercept 02/07/2020) SOLN   NDC: Gretta Cool, Lot: [id number]   Route: Intravitreal, Site: Left Eye, Waste: 0 mg  Post-op Post injection  exam found visual acuity of at least counting fingers. The patient tolerated the procedure well. There were no complications. The patient received written and verbal post procedure care education. Post injection medications were not given.                 ASSESSMENT/PLAN:  No problem-specific Assessment & Plan notes found for this encounter.      ICD-10-CM   1. Exudative age-related macular degeneration of left eye with active choroidal neovascularization (HCC)  H35.3221 OCT, Retina - OU - Both Eyes    Intravitreal Injection, Pharmacologic Agent - OS - Left Eye    aflibercept (EYLEA) SOLN 2 mg    1.  2.  3.  Ophthalmic Meds Ordered this visit:  Meds ordered this encounter  Medications  . aflibercept (EYLEA) SOLN 2 mg       Return in about 5 weeks (around 03/11/2020) for EYLEA OCT, OS.  There are no Patient Instructions on file for this visit.   Explained the diagnoses, plan, and follow up with the patient and they expressed understanding.  Patient expressed understanding of the importance of proper follow up care.   Clent Demark Marra Fraga M.D. Diseases & Surgery of the Retina and Vitreous [facility] @TODAY @     Abbreviations: M myopia (nearsighted); A astigmatism; H hyperopia (farsighted); P presbyopia; Mrx spectacle prescription;  CTL contact lenses; OD right eye; OS left eye; OU both eyes  XT exotropia; ET esotropia; PEK punctate epithelial keratitis; PEE punctate epithelial erosions; DES dry eye syndrome; MGD meibomian gland dysfunction; ATs artificial tears; PFAT's preservative free artificial tears; [place] nuclear sclerotic cataract; PSC posterior subcapsular cataract; ERM epi-retinal membrane; PVD posterior vitreous detachment; RD retinal detachment; DM diabetes mellitus; DR diabetic retinopathy; NPDR  non-proliferative diabetic retinopathy; PDR proliferative diabetic retinopathy; CSME clinically significant macular edema; DME diabetic macular edema; dbh dot blot hemorrhages; CWS cotton wool spot; POAG primary open angle glaucoma; C/D cup-to-disc ratio; HVF humphrey visual field; GVF goldmann visual field; OCT optical coherence tomography; IOP intraocular pressure; BRVO Branch retinal vein occlusion; CRVO central retinal vein occlusion; CRAO central retinal artery occlusion; BRAO branch retinal artery occlusion; RT retinal tear; SB scleral buckle; PPV pars plana vitrectomy; VH Vitreous hemorrhage; PRP panretinal laser photocoagulation; IVK intravitreal kenalog; VMT vitreomacular traction; MH Macular hole;  NVD neovascularization of the disc; NVE neovascularization elsewhere; AREDS age related eye disease study; ARMD age related macular degeneration; POAG primary open angle glaucoma; EBMD epithelial/anterior basement membrane dystrophy; ACIOL anterior chamber intraocular lens; IOL intraocular lens; PCIOL posterior chamber intraocular lens; Phaco/IOL phacoemulsification with intraocular lens placement; [place] photorefractive keratectomy; LASIK laser assisted in situ keratomileusis; HTN hypertension; DM diabetes mellitus; COPD chronic obstructive pulmonary disease

## 2020-02-07 ENCOUNTER — Encounter: Payer: Commercial Managed Care - PPO | Admitting: Family Medicine

## 2020-02-28 ENCOUNTER — Other Ambulatory Visit: Payer: Self-pay

## 2020-02-28 ENCOUNTER — Encounter: Payer: Self-pay | Admitting: Family Medicine

## 2020-02-28 ENCOUNTER — Ambulatory Visit (INDEPENDENT_AMBULATORY_CARE_PROVIDER_SITE_OTHER): Payer: Commercial Managed Care - PPO | Admitting: Family Medicine

## 2020-02-28 VITALS — BP 110/62 | HR 89 | Temp 98.1°F | Ht 74.0 in | Wt 222.1 lb

## 2020-02-28 DIAGNOSIS — I10 Essential (primary) hypertension: Secondary | ICD-10-CM | POA: Diagnosis not present

## 2020-02-28 DIAGNOSIS — R5383 Other fatigue: Secondary | ICD-10-CM | POA: Diagnosis not present

## 2020-02-28 DIAGNOSIS — R7989 Other specified abnormal findings of blood chemistry: Secondary | ICD-10-CM | POA: Diagnosis not present

## 2020-02-28 DIAGNOSIS — Z0000 Encounter for general adult medical examination without abnormal findings: Secondary | ICD-10-CM | POA: Diagnosis not present

## 2020-02-28 DIAGNOSIS — E785 Hyperlipidemia, unspecified: Secondary | ICD-10-CM | POA: Diagnosis not present

## 2020-02-28 DIAGNOSIS — I2584 Coronary atherosclerosis due to calcified coronary lesion: Secondary | ICD-10-CM

## 2020-02-28 DIAGNOSIS — I2510 Atherosclerotic heart disease of native coronary artery without angina pectoris: Secondary | ICD-10-CM

## 2020-02-28 DIAGNOSIS — E663 Overweight: Secondary | ICD-10-CM

## 2020-02-28 DIAGNOSIS — J449 Chronic obstructive pulmonary disease, unspecified: Secondary | ICD-10-CM

## 2020-02-28 LAB — COMPREHENSIVE METABOLIC PANEL
ALT: 57 U/L — ABNORMAL HIGH (ref 0–53)
AST: 46 U/L — ABNORMAL HIGH (ref 0–37)
Albumin: 4.8 g/dL (ref 3.5–5.2)
Alkaline Phosphatase: 76 U/L (ref 39–117)
BUN: 14 mg/dL (ref 6–23)
CO2: 26 mEq/L (ref 19–32)
Calcium: 9.2 mg/dL (ref 8.4–10.5)
Chloride: 98 mEq/L (ref 96–112)
Creatinine, Ser: 0.87 mg/dL (ref 0.40–1.50)
GFR: 88.5 mL/min (ref >60.00)
Glucose, Bld: 98 mg/dL (ref 70–99)
Potassium: 4.8 mEq/L (ref 3.5–5.1)
Sodium: 131 mEq/L — ABNORMAL LOW (ref 135–145)
Total Bilirubin: 0.6 mg/dL (ref 0.2–1.2)
Total Protein: 7.6 g/dL (ref 6.0–8.3)

## 2020-02-28 LAB — CBC WITH DIFFERENTIAL/PLATELET
Basophils Absolute: 0 10*3/uL (ref 0.0–0.1)
Basophils Relative: 0.5 % (ref 0.0–3.0)
Eosinophils Absolute: 0.1 10*3/uL (ref 0.0–0.7)
Eosinophils Relative: 1.8 % (ref 0.0–5.0)
HCT: 39.5 % (ref 39.0–52.0)
Hemoglobin: 13.7 g/dL (ref 13.0–17.0)
Lymphocytes Relative: 18.2 % (ref 12.0–46.0)
Lymphs Abs: 1.1 10*3/uL (ref 0.7–4.0)
MCHC: 34.7 g/dL (ref 30.0–36.0)
MCV: 101.1 fl — ABNORMAL HIGH (ref 78.0–100.0)
Monocytes Absolute: 0.7 10*3/uL (ref 0.1–1.0)
Monocytes Relative: 11.9 % (ref 3.0–12.0)
Neutro Abs: 3.9 10*3/uL (ref 1.4–7.7)
Neutrophils Relative %: 67.6 % (ref 43.0–77.0)
Platelets: 167 10*3/uL (ref 150.0–400.0)
RBC: 3.91 Mil/uL — ABNORMAL LOW (ref 4.22–5.81)
RDW: 12.8 % (ref 11.5–15.5)
WBC: 5.8 10*3/uL (ref 4.0–10.5)

## 2020-02-28 LAB — VITAMIN D 25 HYDROXY (VIT D DEFICIENCY, FRACTURES): VITD: 43.01 ng/mL (ref 30.00–100.00)

## 2020-02-28 LAB — LIPID PANEL
Cholesterol: 188 mg/dL (ref 0–200)
HDL: 53.4 mg/dL (ref >39.00)
NonHDL: 134.63
Total CHOL/HDL Ratio: 4
Triglycerides: 204 mg/dL — ABNORMAL HIGH (ref 0.0–149.0)
VLDL: 40.8 mg/dL — ABNORMAL HIGH (ref 0.0–40.0)

## 2020-02-28 LAB — TSH: TSH: 3.9 u[IU]/mL (ref 0.35–4.50)

## 2020-02-28 LAB — LDL CHOLESTEROL, DIRECT: Direct LDL: 112 mg/dL

## 2020-02-28 LAB — T4, FREE: Free T4: 0.7 ng/dL (ref 0.60–1.60)

## 2020-02-28 MED ORDER — PANTOPRAZOLE SODIUM 20 MG PO TBEC: 20.0000 mg | DELAYED_RELEASE_TABLET | Freq: Every day | ORAL | 1 refills | Status: DC

## 2020-02-28 NOTE — Progress Notes (Signed)
Subjective:    Patient ID: [name], male    DOB: [birth year], 64 y.o.   MRN: [id number]  HPI Chief Complaint  Patient presents with  . Annual Exam    decreased energy   This is a 64 yo male who presents today for CPE. Has been overall doing well. Has been out of work for several months due to shoulder injury/ surgery. Just getting back to work.  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  Working as a Emergency planning/management officer.   Last CPE-unknown, have labs done 08/09/2019 PSA- Colonoscopy- 2019-Dr. 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   Review of Systems  Constitutional: Positive for fatigue.  HENT: Negative.   Eyes: Negative.   Respiratory: Positive for shortness of breath (stable, hx copd, followed by pulmonary).   Cardiovascular: Negative.   Gastrointestinal: Negative.   Endocrine: Negative.   Genitourinary: Negative.   Musculoskeletal:       Shoulder pain following surgery. Improved.   Skin: Negative.        Objective:   Physical Exam Vitals reviewed.  Constitutional:      General: He is not in acute distress.    Appearance: Normal appearance. He is normal weight. He is not ill-appearing, toxic-appearing or diaphoretic.  HENT:     Head: Normocephalic and atraumatic.     Right Ear: External ear normal.     Left Ear: External ear normal.  Eyes:     Conjunctiva/sclera: Conjunctivae normal.  Cardiovascular:     Rate and Rhythm: Normal rate and regular rhythm.     Pulses: Normal pulses.     Heart sounds: Normal heart sounds.  Pulmonary:     Breath sounds: Normal breath sounds.  Abdominal:     General: Abdomen is flat. There is no distension.     Palpations: Abdomen is soft. There is no mass.     Tenderness: There is no abdominal  tenderness. There is no guarding or rebound.     Hernia: No hernia is present.  Musculoskeletal:     Cervical back: Normal range of motion and neck supple.     Right lower leg: No edema.     Left lower leg: No edema.  Skin:    General: Skin is warm and dry.  Neurological:     Mental Status: He is alert and oriented to person, place, and time.  Psychiatric:        Mood and Affect: Mood normal.        Behavior: Behavior normal.        Thought Content: Thought content normal.        Judgment: Judgment normal.       BP 110/62 (BP Location: Right Arm, Patient Position: Sitting, Cuff Size: Normal)   Pulse 89   Temp 98.1 F (36.7 C) (Temporal)   Ht 6\' 2"  (1.88 m)   Wt 222 lb 1.9 oz (100.8 kg)   SpO2 97%   BMI 28.52 kg/m  Wt Readings from Last 3 Encounters:  02/28/20 222 lb 1.9 oz (100.8 kg)  10/16/19 216 lb (98 kg)  09/23/19 230 lb (104.3 kg)       Assessment & Plan:  1. Annual physical exam - Discussed and encouraged healthy lifestyle choices- adequate sleep, regular exercise, stress management and  healthy food choices.    2. Overweight (BMI 25.0-29.9) - has been working on diet, encouraged him to continue to work on weight loss, increased activity as tolerated - Lipid Panel - Vitamin D, 25-hydroxy  3. Hyperlipidemia, unspecified hyperlipidemia type - Lipid Panel - LDL cholesterol, direct  4. Essential hypertension -well controlled, continue lisinopril - Lipid Panel  5. Fatigue, unspecified type - likely due to decreased activity, not working following shoulder surgery - Comprehensive metabolic panel - CBC with Differential - Vitamin D, 25-hydroxy  6. Elevated TSH - TSH - T4, Free - T3; Future  7. Coronary artery disease due to calcified coronary lesion - per chest CT, no symptoms of angina, CHF - Lipid Panel - LDL cholesterol, direct  8. COPD mixed type ([place]) - breathing at baseline, continue pulmonary follow up - patient reports that he has had both  of his Covid vaccinations, I have asked him to provide dates so they can be entered into EMR  This visit occurred during the SARS-CoV-2 public health emergency.  Safety protocols were in place, including screening questions prior to the visit, additional usage of staff PPE, and extensive cleaning of exam room while observing appropriate contact time as indicated for disinfecting solutions.      Clarene Reamer, FNP-BC  [facility]  03/01/2020 5:34 PM

## 2020-02-28 NOTE — Patient Instructions (Signed)
Good to see you today  I have sent in a lower dose of pantoprazole, take daily for 4 weeks then go to every other day. If you tolerate without significant increase of symptoms, can stop. For occasional break through symptoms, can take over the counter famotidine (Pepcid). Avoid food triggers  Follow up in 6 months  Will call you with lab results  Keep up good work with watching diet- avoid starches, sugars, eat mostly protein, non starchy veggies, fruits, drink water and beverages without sugar

## 2020-03-11 ENCOUNTER — Encounter (INDEPENDENT_AMBULATORY_CARE_PROVIDER_SITE_OTHER): Payer: Self-pay | Admitting: Ophthalmology

## 2020-03-11 ENCOUNTER — Other Ambulatory Visit: Payer: Self-pay

## 2020-03-11 ENCOUNTER — Ambulatory Visit (INDEPENDENT_AMBULATORY_CARE_PROVIDER_SITE_OTHER): Payer: Commercial Managed Care - PPO | Admitting: Ophthalmology

## 2020-03-11 DIAGNOSIS — H353221 Exudative age-related macular degeneration, left eye, with active choroidal neovascularization: Secondary | ICD-10-CM

## 2020-03-11 MED ORDER — AFLIBERCEPT 2MG/0.05ML IZ SOLN FOR KALEIDOSCOPE
2.0000 mg | INTRAVITREAL | Status: AC | PRN
  Administered 2020-03-11: 2 mg via INTRAVITREAL

## 2020-03-11 NOTE — Progress Notes (Signed)
03/11/2020     CHIEF COMPLAINT Patient presents for Retina Follow Up   HISTORY OF PRESENT ILLNESS: [name] is a 64 y.o. male who presents to the clinic today for:   HPI    Retina Follow Up    Patient presents with  Wet AMD.  In left eye.  Duration of 5 weeks.  Since onset it is stable.          Comments    5 week follow up - OCT OU, Poss Eylea OS Patient denies change in vision and overall has no complaints.        Last edited by Berenice Bouton on 03/11/2020  8:03 AM. (History)      Referring physician: Emi Belfast, FNP [address] [place],  [place] [address]  HISTORICAL INFORMATION:   Selected notes from the MEDICAL RECORD NUMBER    Lab Results  Component Value Date   HGBA1C 5.4 08/09/2019     CURRENT MEDICATIONS: Current Outpatient Medications (Ophthalmic Drugs)  Medication Sig   Aflibercept 2 MG/0.05ML SOLN Inject 1 Dose into the eye every 8 (eight) weeks.    No current facility-administered medications for this visit. (Ophthalmic Drugs)   Current Outpatient Medications (Other)  Medication Sig   acetaminophen (TYLENOL) 500 MG tablet Take 500-1,000 mg by mouth every 6 (six) hours as needed (for pain.).   aspirin EC 81 MG tablet Take 81 mg by mouth daily.   azithromycin (ZITHROMAX) 250 MG tablet Take 2 tablets on first day, then 1 tablet daily until finished.   Fluticasone-Umeclidin-Vilant (TRELEGY ELLIPTA) 100-62.5-25 MCG/INH AEPB Inhale 1 puff into the lungs daily. Rinse mouth   ipratropium-albuterol (DUONEB) 0.5-2.5 (3) MG/3ML SOLN INHALE THE CONTENTS OF 1 VIAL (3 ML) VIANEBULIZER EVERY 6 HOURS AS NEEDED   ketoconazole (NIZORAL) 2 % cream Apply 1 application topically daily as needed for irritation.    lisinopril (ZESTRIL) 20 MG tablet TAKE 1 TABLET BY MOUTH ONCE A DAY   Multiple Vitamin (MULTIVITAMIN WITH MINERALS) TABS tablet Take 1 tablet by mouth daily.   pantoprazole (PROTONIX) 20 MG tablet Take 1 tablet (20 mg total) by  mouth daily.   pravastatin (PRAVACHOL) 20 MG tablet Take 20 mg by mouth daily.   PROAIR HFA 108 (90 Base) MCG/ACT inhaler INHALE 2 PUFFS INTO THE LUNGS EVERY 6 HOURS AS NEEDED FOR WHEEZING ORSHORTNESS OF BREATH.   tiZANidine (ZANAFLEX) 4 MG tablet Take 1 tablet (4 mg total) by mouth every 8 (eight) hours as needed for muscle spasms.   Current Facility-Administered Medications (Other)  Medication Route   triamcinolone acetonide (KENALOG) 10 MG/ML injection 10 mg Other      REVIEW OF SYSTEMS:    ALLERGIES No Known Allergies  PAST MEDICAL HISTORY Past Medical History:  Diagnosis Date   COPD (chronic obstructive pulmonary disease) (HCC)    GERD (gastroesophageal reflux disease)    History of hepatitis C    treated several years ago through [facility]   Hypertension    Past Surgical History:  Procedure Laterality Date   CATARACT EXTRACTION, BILATERAL     EYE SURGERY Bilateral    ORTHOPEDIC SURGERY Bilateral    pt states fracture repairs to arms and legs.   REVERSE SHOULDER ARTHROPLASTY Left 09/23/2019   Procedure: REVERSE SHOULDER ARTHROPLASTY;  Surgeon: Jones Broom, MD;  Location: [facility];  Service: Orthopedics;  Laterality: Left;    FAMILY HISTORY Family History  Problem Relation Age of Onset   Heart disease Mother  Hypertension Father     SOCIAL HISTORY Social History   Tobacco Use   Smoking status: Former Smoker    Packs/day: 1.00    Years: 35.00    Pack years: 35.00    Types: Cigarettes    Quit date: 10/01/2010    Years since quitting: 9.4   Smokeless tobacco: Never Used  Vaping Use   Vaping Use: Never used  Substance Use Topics   Alcohol use: Yes    Alcohol/week: 42.0 standard drinks    Types: 42 Cans of beer per week    Comment: daily - 6 drinks a day   Drug use: Yes    Types: Marijuana    Comment: last marijuana 2-3 weeks ago         OPHTHALMIC EXAM:  Base Eye Exam    Visual Acuity (Snellen - Linear)      Right Left    Dist [place] 20/30-2 20/200   Dist ph [place] NI 20/100       Tonometry (Tonopen, 8:08 AM)      Right Left   Pressure 17 15       Pupils      Pupils Dark Light Shape React APD   Right PERRL 3 2 Round Brisk None   Left PERRL 3 2 Round Brisk None       Visual Fields (Counting fingers)      Left Right    Full Full       Extraocular Movement      Right Left    Full Full       Neuro/Psych    Oriented x3: Yes   Mood/Affect: Normal       Dilation    Left eye: 1.0% Mydriacyl, 2.5% Phenylephrine @ 8:08 AM        Slit Lamp and Fundus Exam    External Exam      Right Left   External Normal Normal       Slit Lamp Exam      Right Left   Lids/Lashes Normal Normal   Conjunctiva/Sclera White and quiet White and quiet   Cornea Clear Clear   Anterior Chamber Deep and quiet Deep and quiet   Iris Round and reactive Round and reactive   Lens Posterior chamber intraocular lens Posterior chamber intraocular lens   Anterior Vitreous Normal Normal       Fundus Exam      Right Left   Posterior Vitreous Vitrectomized Normal   Disc Normal Normal   C/D Ratio 0.3 0.3   Macula Retinal pigment epithelial mottling Macular thickening, Retinal pigment epithelial mottling, Retinal pigment epithelial detachment   Vessels Normal Normal   Periphery Normal Normal          IMAGING AND PROCEDURES  Imaging and Procedures for 03/11/20  OCT, Retina - OU - Both Eyes       Right Eye Quality was good. Scan locations included subfoveal. Central Foveal Thickness: 252. Progression has been stable. Findings include subretinal hyper-reflective material, retinal drusen , abnormal foveal contour.   Left Eye Quality was good. Scan locations included subfoveal. Central Foveal Thickness: 316. Progression has improved. Findings include subretinal fluid, outer retinal tubulation, subretinal hyper-reflective material, retinal drusen , abnormal foveal contour.   Notes OS, much improved subretinal fluid and  debris status post Eylea 5 weeks previous.  Will repeat intravitreal Eylea today       Intravitreal Injection, Pharmacologic Agent - OS - Left Eye  Time Out 03/11/2020. 8:47 AM. Confirmed correct patient, procedure, site, and patient consented.   Anesthesia Topical anesthesia was used. Anesthetic medications included Akten 3.5%.   Procedure Preparation included Tobramycin 0.3%, 10% betadine to eyelids. A 30 gauge needle was used.   Injection:  2 mg aflibercept Alfonse Flavors) SOLN   NDC: [id number], Lot: [id number]   Route: Intravitreal, Site: Left Eye, Waste: 0 mg  Post-op Post injection exam found visual acuity of at least counting fingers. The patient tolerated the procedure well. There were no complications. The patient received written and verbal post procedure care education. Post injection medications were not given.                 ASSESSMENT/PLAN:  Exudative age-related macular degeneration of left eye with active choroidal neovascularization (HCC) Improved status post resumption of intravitreal Eylea 5 weeks previous, will repeat OS today and exam in 5 to 6 weeks      ICD-10-CM   1. Exudative age-related macular degeneration of left eye with active choroidal neovascularization (HCC)  H35.3221 OCT, Retina - OU - Both Eyes    Intravitreal Injection, Pharmacologic Agent - OS - Left Eye    aflibercept (EYLEA) SOLN 2 mg    1.OS, much improved subretinal fluid and debris status post Eylea 5 weeks previous.  Will repeat intravitreal Eylea today  2.  3.  Ophthalmic Meds Ordered this visit:  Meds ordered this encounter  Medications   aflibercept (EYLEA) SOLN 2 mg       Return in about 5 weeks (around 04/15/2020) for dilate, OS, EYLEA OCT.  There are no Patient Instructions on file for this visit.   Explained the diagnoses, plan, and follow up with the patient and they expressed understanding.  Patient expressed understanding of the importance of proper  follow up care.   Clent Demark Ozias Dicenzo M.D. Diseases & Surgery of the Retina and Vitreous [facility] [place] 03/11/20     Abbreviations: M myopia (nearsighted); A astigmatism; H hyperopia (farsighted); P presbyopia; Mrx spectacle prescription;  CTL contact lenses; OD right eye; OS left eye; OU both eyes  XT exotropia; ET esotropia; PEK punctate epithelial keratitis; PEE punctate epithelial erosions; DES dry eye syndrome; MGD meibomian gland dysfunction; ATs artificial tears; PFAT's preservative free artificial tears; [place] nuclear sclerotic cataract; PSC posterior subcapsular cataract; ERM epi-retinal membrane; PVD posterior vitreous detachment; RD retinal detachment; DM diabetes mellitus; DR diabetic retinopathy; NPDR non-proliferative diabetic retinopathy; PDR proliferative diabetic retinopathy; CSME clinically significant macular edema; DME diabetic macular edema; dbh dot blot hemorrhages; CWS cotton wool spot; POAG primary open angle glaucoma; C/D cup-to-disc ratio; HVF humphrey visual field; GVF goldmann visual field; OCT optical coherence tomography; IOP intraocular pressure; BRVO Branch retinal vein occlusion; CRVO central retinal vein occlusion; CRAO central retinal artery occlusion; BRAO branch retinal artery occlusion; RT retinal tear; SB scleral buckle; PPV pars plana vitrectomy; VH Vitreous hemorrhage; PRP panretinal laser photocoagulation; IVK intravitreal kenalog; VMT vitreomacular traction; MH Macular hole;  NVD neovascularization of the disc; NVE neovascularization elsewhere; AREDS age related eye disease study; ARMD age related macular degeneration; POAG primary open angle glaucoma; EBMD epithelial/anterior basement membrane dystrophy; ACIOL anterior chamber intraocular lens; IOL intraocular lens; PCIOL posterior chamber intraocular lens; Phaco/IOL phacoemulsification with intraocular lens placement; [place] photorefractive keratectomy; LASIK laser assisted in situ keratomileusis; HTN  hypertension; DM diabetes mellitus; COPD chronic obstructive pulmonary disease

## 2020-03-11 NOTE — Assessment & Plan Note (Signed)
Improved status post resumption of intravitreal Eylea 5 weeks previous, will repeat OS today and exam in 5 to 6 weeks

## 2020-03-26 ENCOUNTER — Telehealth: Payer: Self-pay | Admitting: Internal Medicine

## 2020-03-26 NOTE — Telephone Encounter (Signed)
Pt is returning call - [phone]

## 2020-03-26 NOTE — Telephone Encounter (Signed)
Patient not answering phone, will try back later this AM.

## 2020-03-26 NOTE — Telephone Encounter (Signed)
ATC phone just rang, when patient returns call send to Me at [phone].

## 2020-03-26 NOTE — Telephone Encounter (Signed)
ATC X2 unable to leave message.

## 2020-03-26 NOTE — Telephone Encounter (Signed)
Attempted to call patient again, will try back later today, unable to leave message.

## 2020-03-27 MED ORDER — PREDNISONE 10 MG PO TABS: ORAL_TABLET | ORAL | 0 refills | Status: DC

## 2020-03-27 MED ORDER — AZITHROMYCIN 250 MG PO TABS: ORAL_TABLET | ORAL | 0 refills | Status: DC

## 2020-03-27 NOTE — Telephone Encounter (Signed)
ATC pt, there was no answer and no option to leave a message. Will try back. 

## 2020-03-27 NOTE — Telephone Encounter (Signed)
Called and spoke with patient he states that about a week ago he started feeling bad. He states that he is having to use his nebulizer about twice a day but not using his rescue inhaler more often. Does have a productive cough with grey sputum, says its not white like it normally is.   Dr. Maple Hudson please advise on recommendations for this patient. He is requesting prednisone and possibly antibiotic. [facility]

## 2020-03-27 NOTE — Telephone Encounter (Signed)
Attempted to call pt but unable to reach and unable to leave a VM. Will try to call pt back later. I have meds pended until we are able to speak with pt.

## 2020-03-27 NOTE — Telephone Encounter (Signed)
Pt called back about this, please call back.  

## 2020-03-27 NOTE — Telephone Encounter (Signed)
Offer prednisone 10 mg, # 20, 4 X 2 DAYS, 3 X 2 DAYS, 2 X 2 DAYS, 1 X 2 DAYS           Zpak 250 mg, # 6  2 today then one daily

## 2020-03-27 NOTE — Telephone Encounter (Signed)
Spoke with patient and let him know about the Prednisone and z-pack sent to his pharmacy.  Verified the pharmacy on file is correct.  Nothing further needed.

## 2020-04-13 ENCOUNTER — Ambulatory Visit (INDEPENDENT_AMBULATORY_CARE_PROVIDER_SITE_OTHER): Payer: Self-pay

## 2020-04-13 ENCOUNTER — Other Ambulatory Visit: Payer: Self-pay

## 2020-04-13 ENCOUNTER — Encounter: Payer: Self-pay | Admitting: Podiatry

## 2020-04-13 ENCOUNTER — Ambulatory Visit: Payer: BLUE CROSS/BLUE SHIELD | Admitting: Podiatry

## 2020-04-13 VITALS — Temp 97.9°F

## 2020-04-13 DIAGNOSIS — M722 Plantar fascial fibromatosis: Secondary | ICD-10-CM

## 2020-04-13 DIAGNOSIS — M7662 Achilles tendinitis, left leg: Secondary | ICD-10-CM

## 2020-04-13 DIAGNOSIS — M779 Enthesopathy, unspecified: Secondary | ICD-10-CM

## 2020-04-13 NOTE — Patient Instructions (Signed)

## 2020-04-13 NOTE — Progress Notes (Signed)
Subjective:   Patient ID: [name], male   DOB: 64 y.o.   MRN: [id number]   HPI Patient states that he had pain in my right forefoot and my left the back of my heel has been bothersome.  States is been going on for around 8 months and had improved will be treated it previously but gradual recurrence over that time.   ROS      Objective:  Physical Exam  Neurovascular status intact with discomfort which is mostly now around the second MPJ right and around the posterior medial aspect of the left heel at the insertion of the Achilles into the calcaneus.  Also has moderate discomfort bilateral     Assessment:  Inflammatory capsulitis right with Achilles tendinitis acute left medial    Plan:  H&P reviewed both conditions and x-rays.  For the right I did forefoot block aspirated the joint getting a small amount of clear fluid and injected quarter cc dexamethasone Kenalog and for the left I did first discuss the possibility for rupture associated with Achilles tendon injection and he wants the injection I did a sterile prep medial side injected 3 mg Dexasone 5 mg liken advised on reduced activity.  Went ahead today and also casted for functional orthotics to reduce stress on his feet  X-rays indicate no signs of advanced arthritis stress fracture and minimal posterior Achilles spurring bilateral

## 2020-04-14 ENCOUNTER — Ambulatory Visit: Payer: Commercial Managed Care - PPO | Admitting: Internal Medicine

## 2020-04-14 ENCOUNTER — Encounter: Payer: Self-pay | Admitting: Internal Medicine

## 2020-04-14 VITALS — BP 120/70 | HR 109 | Temp 98.3°F | Ht 74.0 in | Wt 218.6 lb

## 2020-04-14 DIAGNOSIS — J449 Chronic obstructive pulmonary disease, unspecified: Secondary | ICD-10-CM

## 2020-04-14 DIAGNOSIS — R918 Other nonspecific abnormal finding of lung field: Secondary | ICD-10-CM

## 2020-04-14 MED ORDER — TRELEGY ELLIPTA 200-62.5-25 MCG/INH IN AEPB: 1.0000 | INHALATION_SPRAY | Freq: Every day | RESPIRATORY_TRACT | 0 refills | Status: DC

## 2020-04-14 NOTE — Patient Instructions (Signed)
Sample x 2 Trelegy 200   Inhale 1 puff then rinse mouth, once daily. Try this instead of Trelegy 100. If you are satisfied that the stronger version works better, then we can change your prescription, otherwise continue current meds.  Please call if we can help  Order- refer to [facility] Robinsons for low dose screening CT chest program    Dx COPD, mixed type

## 2020-04-14 NOTE — Assessment & Plan Note (Signed)
He understands gradual worsening over time is part of this disease. No obvious additional therapy to offer. Plan- continue current meds. Try samples of Telegy 200 for comparison.

## 2020-04-14 NOTE — Progress Notes (Signed)
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 Despite repetitive coaching he did not generate acceptable curves.  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 Originally prescribed by his eye doctor for macular degeneration. Pending CT chest 10/18/19- Reminded Now laid off. Breathing ok, needing refills.  Girlfriend tells him he snores loudly but hasn't mentioned apnea and he doesn't notice daytime tiredness.  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  worse since last visit.  more sob with less activity.  walks less and gets more sob.  using nebulizer almost daily. very little cough.  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 Little routine cough or wheeze now. Denies ankle edema or chest pain.  Discussed low dose screening CT program. CT chest 10/18/19-  IMPRESSION: 1. No acute findings or suspicious pulmonary nodules. 2. Stable old right-sided rib fractures and old T3 and T4 compression deformities. 3. Emphysema (ICD10-J43.9).  ROS-see HPI + = positive Constitutional:    No-   weight loss, night sweats, fevers, chills, fatigue, lassitude. HEENT:   No-  headaches, difficulty swallowing, tooth/dental problems, sore throat,       No-  sneezing, itching, ear ache, nasal congestion, post nasal drip,  CV:  No-   chest pain, orthopnea, PND, swelling in lower extremities, anasarca,                  dizziness, palpitations Resp: +  shortness of breath with exertion or at rest.               productive cough,  + non-productive cough,  No- coughing up of blood.          change in color of mucus.  No- wheezing.  +snores Skin: No-   rash or lesions. GI:  No-   heartburn, indigestion, abdominal pain, nausea, vomiting,  GU:  MS:  No-   joint pain or swelling.  . Neuro-     nothing unusual Psych:  No- change in mood or affect. No depression or anxiety.  No memory loss.  OBJ- Physical Exam General- Alert, Oriented, Affect-appropriate, Distress- none acute Skin-  Lymphadenopathy- none Head- atraumatic            Eyes- Gross vision intact, PERRLA, conjunctivae and secretions clear            Ears- Hearing, canals-normal            Nose- Clear, no-Septal dev, mucus, polyps, erosion, perforation             Throat- Mallampati III-IV , mucosa clear , drainage- none, tonsils- atrophic Neck- flexible , trachea midline, no stridor , thyroid nl, carotid no bruit Chest - symmetrical excursion , unlabored           Heart/CV- RRR , no murmur , no gallop  , no rub, nl s1 s2                           -  JVD- none , edema- none, stasis changes- none, varices- none           Lung- +clear but distant, wheeze- none, cough- none, dullness-none, rub- none           Chest wall-  Abd- tender-no, distended-no, bowel sounds-present, HSM- no Br/ Gen/ Rectal- Not done, not indicated Extrem- cyanosis- none, clubbing, none, atrophy- none, strength- nl,             Neuro-+ tremor hands and mild head-bob

## 2020-04-14 NOTE — Assessment & Plan Note (Signed)
Plan- low dose screening CT program

## 2020-04-15 ENCOUNTER — Ambulatory Visit (INDEPENDENT_AMBULATORY_CARE_PROVIDER_SITE_OTHER): Payer: 59 | Admitting: Ophthalmology

## 2020-04-15 ENCOUNTER — Encounter (INDEPENDENT_AMBULATORY_CARE_PROVIDER_SITE_OTHER): Payer: Self-pay | Admitting: Ophthalmology

## 2020-04-15 ENCOUNTER — Other Ambulatory Visit: Payer: Self-pay

## 2020-04-15 DIAGNOSIS — H353221 Exudative age-related macular degeneration, left eye, with active choroidal neovascularization: Secondary | ICD-10-CM

## 2020-04-15 DIAGNOSIS — H353212 Exudative age-related macular degeneration, right eye, with inactive choroidal neovascularization: Secondary | ICD-10-CM

## 2020-04-15 MED ORDER — AFLIBERCEPT 2MG/0.05ML IZ SOLN FOR KALEIDOSCOPE
2.0000 mg | INTRAVITREAL | Status: AC | PRN
  Administered 2020-04-15: 2 mg via INTRAVITREAL

## 2020-04-15 NOTE — Progress Notes (Signed)
04/15/2020     CHIEF COMPLAINT Patient presents for Retina Follow Up   HISTORY OF PRESENT ILLNESS: [name] is a 64 y.o. male who presents to the clinic today for:   HPI    Retina Follow Up    Patient presents with  Wet AMD.  In left eye.  Severity is moderate.  Duration of 5 weeks.  Since onset it is stable.  I, the attending physician,  performed the HPI with the patient and updated documentation appropriately.          Comments    5 Week AMD f\u OS. Possible Eylea OS. OCT  Pt states no changes or issues since last visit.  On questioning, patient does report that he did stop using his nightly oxygen.         Last edited by Edmon Crape, MD on 04/15/2020  8:45 AM. (History)      Referring physician: Emi Belfast, FNP [address] [place],  [place] [address]  HISTORICAL INFORMATION:   Selected notes from the MEDICAL RECORD NUMBER    Lab Results  Component Value Date   HGBA1C 5.4 08/09/2019     CURRENT MEDICATIONS: Current Outpatient Medications (Ophthalmic Drugs)  Medication Sig  . Aflibercept 2 MG/0.05ML SOLN Inject 1 Dose into the eye every 8 (eight) weeks.    No current facility-administered medications for this visit. (Ophthalmic Drugs)   Current Outpatient Medications (Other)  Medication Sig  . acetaminophen (TYLENOL) 500 MG tablet Take 500-1,000 mg by mouth every 6 (six) hours as needed (for pain.).  Marland Kitchen aspirin EC 81 MG tablet Take 81 mg by mouth daily.  . Fluticasone-Umeclidin-Vilant (TRELEGY ELLIPTA) 100-62.5-25 MCG/INH AEPB Inhale 1 puff into the lungs daily. Rinse mouth  . Fluticasone-Umeclidin-Vilant (TRELEGY ELLIPTA) 200-62.5-25 MCG/INH AEPB Inhale 1 puff into the lungs daily.  Marland Kitchen ipratropium-albuterol (DUONEB) 0.5-2.5 (3) MG/3ML SOLN INHALE THE CONTENTS OF 1 VIAL (3 ML) VIANEBULIZER EVERY 6 HOURS AS NEEDED  . ketoconazole (NIZORAL) 2 % cream Apply 1 application topically daily as needed for irritation.   Marland Kitchen lisinopril (ZESTRIL)  20 MG tablet TAKE 1 TABLET BY MOUTH ONCE A DAY  . Multiple Vitamin (MULTIVITAMIN WITH MINERALS) TABS tablet Take 1 tablet by mouth daily.  . pantoprazole (PROTONIX) 20 MG tablet Take 1 tablet (20 mg total) by mouth daily.  . pravastatin (PRAVACHOL) 20 MG tablet Take 20 mg by mouth daily.  Marland Kitchen PROAIR HFA 108 (90 Base) MCG/ACT inhaler INHALE 2 PUFFS INTO THE LUNGS EVERY 6 HOURS AS NEEDED FOR WHEEZING ORSHORTNESS OF BREATH.  Marland Kitchen tiZANidine (ZANAFLEX) 4 MG tablet Take 1 tablet (4 mg total) by mouth every 8 (eight) hours as needed for muscle spasms.   Current Facility-Administered Medications (Other)  Medication Route  . triamcinolone acetonide (KENALOG) 10 MG/ML injection 10 mg Other      REVIEW OF SYSTEMS:    ALLERGIES No Known Allergies  PAST MEDICAL HISTORY Past Medical History:  Diagnosis Date  . COPD (chronic obstructive pulmonary disease) (HCC)   . GERD (gastroesophageal reflux disease)   . History of hepatitis C    treated several years ago through [place]  . Hypertension    Past Surgical History:  Procedure Laterality Date  . CATARACT EXTRACTION, BILATERAL    . EYE SURGERY Bilateral   . ORTHOPEDIC SURGERY Bilateral    pt states fracture repairs to arms and legs.  Marland Kitchen REVERSE SHOULDER ARTHROPLASTY Left 09/23/2019   Procedure: REVERSE SHOULDER ARTHROPLASTY;  Surgeon:  Jones Broom, MD;  Location: [facility];  Service: Orthopedics;  Laterality: Left;    FAMILY HISTORY Family History  Problem Relation Age of Onset  . Heart disease Mother   . Hypertension Father     SOCIAL HISTORY Social History   Tobacco Use  . Smoking status: Former Smoker    Packs/day: 1.00    Years: 35.00    Pack years: 35.00    Types: Cigarettes    Quit date: 10/01/2010    Years since quitting: 9.5  . Smokeless tobacco: Never Used  Vaping Use  . Vaping Use: Never used  Substance Use Topics  . Alcohol use: Yes    Alcohol/week: 42.0 standard drinks    Types: 42 Cans of beer per week    Comment:  daily - 6 drinks a day  . Drug use: Yes    Types: Marijuana    Comment: last marijuana 2-3 weeks ago         OPHTHALMIC EXAM:  Base Eye Exam    Visual Acuity (Snellen - Linear)      Right Left   Dist [place] 20/50 + 20/400   Dist ph [place] 20/30 -1 20/200       Tonometry (Tonopen, 8:20 AM)      Right Left   Pressure 12 10       Pupils      Dark Light Shape React APD   Right 3 2 Round Brisk None   Left 3 2 Round Brisk None       Visual Fields (Counting fingers)      Left Right    Full Full       Neuro/Psych    Oriented x3: Yes   Mood/Affect: Normal       Dilation    Left eye: 1.0% Mydriacyl, 2.5% Phenylephrine @ 8:20 AM        Slit Lamp and Fundus Exam    External Exam      Right Left   External Normal Normal       Slit Lamp Exam      Right Left   Lids/Lashes Normal Normal   Conjunctiva/Sclera White and quiet White and quiet   Cornea Clear Clear   Anterior Chamber Deep and quiet Deep and quiet   Iris Round and reactive Round and reactive   Lens Posterior chamber intraocular lens Posterior chamber intraocular lens   Anterior Vitreous Normal Normal       Fundus Exam      Right Left   Posterior Vitreous  Normal   Disc  Normal   C/D Ratio  0.3   Macula  Macular thickening, Retinal pigment epithelial mottling, Retinal pigment epithelial detachment   Vessels  Normal   Periphery  Normal          IMAGING AND PROCEDURES  Imaging and Procedures for 04/15/20  OCT, Retina - OU - Both Eyes       Right Eye Quality was good. Scan locations included subfoveal. Central Foveal Thickness: 251. Progression has been stable. Findings include no SRF, abnormal foveal contour, outer retinal atrophy.   Left Eye Quality was good. Scan locations included subfoveal. Central Foveal Thickness: 360. Progression has worsened. Findings include outer retinal atrophy, subretinal fluid, subretinal hyper-reflective material, abnormal foveal contour.   Notes OS with recurrent  subretinal fluid, accounts for a decline in visual acuity.  Coincident with cessation of nightly oxygen use.  Will resume nightly oxygen to maximize oxygenation of the foveal region.  Intravitreal Injection, Pharmacologic Agent - OS - Left Eye       Time Out 04/15/2020. 8:49 AM. Confirmed correct patient, procedure, site, and patient consented.   Anesthesia Topical anesthesia was used. Anesthetic medications included Akten 3.5%.   Procedure Preparation included Ofloxacin , 10% betadine to eyelids, 5% betadine to ocular surface. A 30 gauge needle was used.   Injection:  2 mg aflibercept Gretta Cool) SOLN   NDC: L6038910, Lot: [id number]   Route: Intravitreal, Site: Left Eye, Waste: 0 mg  Post-op Post injection exam found visual acuity of at least counting fingers. The patient tolerated the procedure well. There were no complications. The patient received written and verbal post procedure care education. Post injection medications were not given.                 ASSESSMENT/PLAN:  Exudative age-related macular degeneration of left eye with active choroidal neovascularization (HCC) Increased subretinal fluid currently at 5-week examination left eye.  This is a reoccurrence of the disease.  Following treatment, thus I asked the patient if he is continuing his nightly oxygen.  I encouraged resumption of nightly oxygen while sleeping to maximize the oxygenation with this perfusion of the macula left eye.   On questioning, patient does report that he did stop using his nightly oxygen.  Encourage the resumption of this nightly oxygen to further maximize macular oxygenation as the disease left eye is reawakened and worsened.    ICD-10-CM   1. Exudative age-related macular degeneration of left eye with active choroidal neovascularization (HCC)  H35.3221 OCT, Retina - OU - Both Eyes    Intravitreal Injection, Pharmacologic Agent - OS - Left Eye    aflibercept (EYLEA) SOLN 2 mg  2.  Exudative age-related macular degeneration of right eye with inactive choroidal neovascularization (HCC)  H35.3212     1.  Eylea OS today and examination in 5 weeks hopefully post resumption of nightly oxygen use  2.  3.  Ophthalmic Meds Ordered this visit:  Meds ordered this encounter  Medications  . aflibercept (EYLEA) SOLN 2 mg       Return in about 5 weeks (around 05/20/2020) for dilate, OS, EYLEA OCT.  There are no Patient Instructions on file for this visit.   Explained the diagnoses, plan, and follow up with the patient and they expressed understanding.  Patient expressed understanding of the importance of proper follow up care.   Alford Highland Bingham Millette M.D. Diseases & Surgery of the Retina and Vitreous [facility] 04/15/20     Abbreviations: M myopia (nearsighted); A astigmatism; H hyperopia (farsighted); P presbyopia; Mrx spectacle prescription;  CTL contact lenses; OD right eye; OS left eye; OU both eyes  XT exotropia; ET esotropia; PEK punctate epithelial keratitis; PEE punctate epithelial erosions; DES dry eye syndrome; MGD meibomian gland dysfunction; ATs artificial tears; PFAT's preservative free artificial tears; NSC nuclear sclerotic cataract; PSC posterior subcapsular cataract; ERM epi-retinal membrane; PVD posterior vitreous detachment; RD retinal detachment; DM diabetes mellitus; DR diabetic retinopathy; NPDR non-proliferative diabetic retinopathy; PDR proliferative diabetic retinopathy; CSME clinically significant macular edema; DME diabetic macular edema; dbh dot blot hemorrhages; CWS cotton wool spot; POAG primary open angle glaucoma; C/D cup-to-disc ratio; HVF humphrey visual field; GVF goldmann visual field; OCT optical coherence tomography; IOP intraocular pressure; BRVO Branch retinal vein occlusion; CRVO central retinal vein occlusion; CRAO central retinal artery occlusion; BRAO branch retinal artery occlusion; RT retinal tear; SB scleral buckle; PPV  pars plana vitrectomy; VH Vitreous hemorrhage; PRP  panretinal laser photocoagulation; IVK intravitreal kenalog; VMT vitreomacular traction; MH Macular hole;  NVD neovascularization of the disc; NVE neovascularization elsewhere; AREDS age related eye disease study; ARMD age related macular degeneration; POAG primary open angle glaucoma; EBMD epithelial/anterior basement membrane dystrophy; ACIOL anterior chamber intraocular lens; IOL intraocular lens; PCIOL posterior chamber intraocular lens; Phaco/IOL phacoemulsification with intraocular lens placement; [place] photorefractive keratectomy; LASIK laser assisted in situ keratomileusis; HTN hypertension; DM diabetes mellitus; COPD chronic obstructive pulmonary disease

## 2020-04-15 NOTE — Assessment & Plan Note (Addendum)
Increased subretinal fluid currently at 5-week examination left eye.  This is a reoccurrence of the disease.  Following treatment, thus I asked the patient if he is continuing his nightly oxygen.  I encouraged resumption of nightly oxygen while sleeping to maximize the oxygenation with this perfusion of the macula left eye.

## 2020-05-04 ENCOUNTER — Ambulatory Visit: Payer: Self-pay | Admitting: Podiatry

## 2020-05-06 ENCOUNTER — Ambulatory Visit: Payer: 59 | Admitting: Podiatry

## 2020-05-06 ENCOUNTER — Other Ambulatory Visit: Payer: Self-pay

## 2020-05-06 ENCOUNTER — Encounter: Payer: Self-pay | Admitting: Podiatry

## 2020-05-06 DIAGNOSIS — M7662 Achilles tendinitis, left leg: Secondary | ICD-10-CM | POA: Diagnosis not present

## 2020-05-06 DIAGNOSIS — M7751 Other enthesopathy of right foot: Secondary | ICD-10-CM | POA: Diagnosis not present

## 2020-05-06 DIAGNOSIS — M779 Enthesopathy, unspecified: Secondary | ICD-10-CM

## 2020-05-06 NOTE — Progress Notes (Signed)
Subjective:   Patient ID: [name], male   DOB: 64 y.o.   MRN: [id number]   HPI Patient states the left heel seems to be improved some but I still have some discomfort but I am having still continued pain in my right forefoot   ROS      Objective:  Physical Exam  Neurovascular status intact with discomfort in the posterior left heel that is present but improved and is noted to have inflammation and pain of the 3rd MPJ over the second MPJ currently     Assessment:  Achilles tendinitis left present but improved with capsulitis right     Plan:  H&P condition reviewed and at this time I went ahead and I discussed for the Achilles stretching exercises ice therapy and heel lift and for the right I did do sterile prep and injected around the 3rd MPJ 3 mg dexamethasone 5 mg Xylocaine and patient will be seen back for orthotic dispensing and possibly to see me

## 2020-05-20 ENCOUNTER — Encounter (INDEPENDENT_AMBULATORY_CARE_PROVIDER_SITE_OTHER): Payer: 59 | Admitting: Ophthalmology

## 2020-05-22 ENCOUNTER — Other Ambulatory Visit: Payer: Self-pay | Admitting: Family Medicine

## 2020-05-26 ENCOUNTER — Other Ambulatory Visit: Payer: Self-pay

## 2020-05-26 ENCOUNTER — Ambulatory Visit: Payer: 59 | Admitting: Orthotics

## 2020-05-26 DIAGNOSIS — M722 Plantar fascial fibromatosis: Secondary | ICD-10-CM

## 2020-05-26 DIAGNOSIS — M779 Enthesopathy, unspecified: Secondary | ICD-10-CM

## 2020-05-26 DIAGNOSIS — M7662 Achilles tendinitis, left leg: Secondary | ICD-10-CM

## 2020-05-26 NOTE — Telephone Encounter (Signed)
Last office visit 02/28/2020 for CPE.  Diclofenac is not on current medication list.  Refill?

## 2020-05-26 NOTE — Progress Notes (Signed)
Patient came in today to p/up functional foot orthotics.   The orthotics were assessed to both fit and function.  The F/O addressed the biomechanical issues/pathologies as intended, offering good longitudinal arch support, proper offloading, and foot support. There weren't any signs of discomfort or irritation.  The F/O fit properly in footwear with minimal trimming/adjustments. 

## 2020-05-27 ENCOUNTER — Encounter (INDEPENDENT_AMBULATORY_CARE_PROVIDER_SITE_OTHER): Payer: Self-pay | Admitting: Ophthalmology

## 2020-05-27 ENCOUNTER — Ambulatory Visit (INDEPENDENT_AMBULATORY_CARE_PROVIDER_SITE_OTHER): Payer: 59 | Admitting: Ophthalmology

## 2020-05-27 ENCOUNTER — Other Ambulatory Visit: Payer: Self-pay

## 2020-05-27 ENCOUNTER — Other Ambulatory Visit: Payer: Self-pay | Admitting: Podiatry

## 2020-05-27 DIAGNOSIS — H353221 Exudative age-related macular degeneration, left eye, with active choroidal neovascularization: Secondary | ICD-10-CM | POA: Diagnosis not present

## 2020-05-27 MED ORDER — AFLIBERCEPT 2MG/0.05ML IZ SOLN FOR KALEIDOSCOPE
2.0000 mg | INTRAVITREAL | Status: AC | PRN
  Administered 2020-05-27: 2 mg via INTRAVITREAL

## 2020-05-27 NOTE — Patient Instructions (Signed)
Patient to report any change in visual acuity or distortions in either eye.

## 2020-05-27 NOTE — Telephone Encounter (Signed)
Please advise 

## 2020-05-27 NOTE — Telephone Encounter (Signed)
Called pt and advise him of msg from Silva Bandy, [place].  Pt verbalized understanding.

## 2020-05-27 NOTE — Telephone Encounter (Signed)
Please call patient and let him know that he will need to get diclofenac from podiatry who prescribed it for him previously. I

## 2020-05-27 NOTE — Assessment & Plan Note (Signed)
OS with persistent subretinal fluid and foveal elevation on the basis of CNVM, not changing on persistent repeat injection of intravitreal Eylea OS.  May be resistant now may consider change of medications upon next visit

## 2020-05-27 NOTE — Progress Notes (Signed)
05/27/2020     CHIEF COMPLAINT Patient presents for Retina Follow Up   HISTORY OF PRESENT ILLNESS: [name] is a 64 y.o. male who presents to the clinic today for:   HPI    Retina Follow Up    Patient presents with  Wet AMD.  In left eye.  This started 6 weeks ago.  Severity is mild.  Duration of 6 weeks.  Since onset it is stable.          Comments    6 Week AMD F/U OS, poss Eylea OS  Pt reports possible slight decline in [place] OS. Pt denies any other new symptoms OU.       Last edited by Ileana Roup, COA on 05/27/2020  8:05 AM. (History)      Referring physician: Emi Belfast, FNP [address] [place],  [place] [address]  HISTORICAL INFORMATION:   Selected notes from the MEDICAL RECORD NUMBER    Lab Results  Component Value Date   HGBA1C 5.4 08/09/2019     CURRENT MEDICATIONS: Current Outpatient Medications (Ophthalmic Drugs)  Medication Sig   Aflibercept 2 MG/0.05ML SOLN Inject 1 Dose into the eye every 8 (eight) weeks.    No current facility-administered medications for this visit. (Ophthalmic Drugs)   Current Outpatient Medications (Other)  Medication Sig   acetaminophen (TYLENOL) 500 MG tablet Take 500-1,000 mg by mouth every 6 (six) hours as needed (for pain.).   aspirin EC 81 MG tablet Take 81 mg by mouth daily.   Fluticasone-Umeclidin-Vilant (TRELEGY ELLIPTA) 100-62.5-25 MCG/INH AEPB Inhale 1 puff into the lungs daily. Rinse mouth   Fluticasone-Umeclidin-Vilant (TRELEGY ELLIPTA) 200-62.5-25 MCG/INH AEPB Inhale 1 puff into the lungs daily.   ipratropium-albuterol (DUONEB) 0.5-2.5 (3) MG/3ML SOLN INHALE THE CONTENTS OF 1 VIAL (3 ML) VIANEBULIZER EVERY 6 HOURS AS NEEDED   ketoconazole (NIZORAL) 2 % cream Apply 1 application topically daily as needed for irritation.    lisinopril (ZESTRIL) 20 MG tablet TAKE 1 TABLET BY MOUTH ONCE A DAY   Multiple Vitamin (MULTIVITAMIN WITH MINERALS) TABS tablet Take 1 tablet by mouth daily.    pantoprazole (PROTONIX) 20 MG tablet Take 1 tablet (20 mg total) by mouth daily.   pravastatin (PRAVACHOL) 20 MG tablet Take 20 mg by mouth daily.   PROAIR HFA 108 (90 Base) MCG/ACT inhaler INHALE 2 PUFFS INTO THE LUNGS EVERY 6 HOURS AS NEEDED FOR WHEEZING ORSHORTNESS OF BREATH.   tiZANidine (ZANAFLEX) 4 MG tablet Take 1 tablet (4 mg total) by mouth every 8 (eight) hours as needed for muscle spasms.   Current Facility-Administered Medications (Other)  Medication Route   triamcinolone acetonide (KENALOG) 10 MG/ML injection 10 mg Other      REVIEW OF SYSTEMS:    ALLERGIES No Known Allergies  PAST MEDICAL HISTORY Past Medical History:  Diagnosis Date   COPD (chronic obstructive pulmonary disease) (HCC)    GERD (gastroesophageal reflux disease)    History of hepatitis C    treated several years ago through [facility]   Hypertension    Past Surgical History:  Procedure Laterality Date   CATARACT EXTRACTION, BILATERAL     EYE SURGERY Bilateral    ORTHOPEDIC SURGERY Bilateral    pt states fracture repairs to arms and legs.   REVERSE SHOULDER ARTHROPLASTY Left 09/23/2019   Procedure: REVERSE SHOULDER ARTHROPLASTY;  Surgeon: Jones Broom, MD;  Location: [facility];  Service: Orthopedics;  Laterality: Left;    FAMILY HISTORY Family History  Problem  Relation Age of Onset   Heart disease Mother    Hypertension Father     SOCIAL HISTORY Social History   Tobacco Use   Smoking status: Former Smoker    Packs/day: 1.00    Years: 35.00    Pack years: 35.00    Types: Cigarettes    Quit date: 10/01/2010    Years since quitting: 9.6   Smokeless tobacco: Never Used  Vaping Use   Vaping Use: Never used  Substance Use Topics   Alcohol use: Yes    Alcohol/week: 42.0 standard drinks    Types: 42 Cans of beer per week    Comment: daily - 6 drinks a day   Drug use: Yes    Types: Marijuana    Comment: last marijuana 2-3 weeks ago         OPHTHALMIC EXAM:  Base  Eye Exam    Visual Acuity (ETDRS)      Right Left   Dist [place] 20/40 20/200   Dist ph [place] 20/30 -2 NI       Tonometry (Tonopen, 8:05 AM)      Right Left   Pressure 15 15       Pupils      Pupils Dark Light Shape React APD   Right PERRL 3 2 Round Brisk None   Left PERRL 3 2 Round Brisk None       Visual Fields (Counting fingers)      Left Right    Full Full       Extraocular Movement      Right Left    Full Full       Neuro/Psych    Oriented x3: Yes   Mood/Affect: Normal       Dilation    Left eye: 1.0% Mydriacyl, 2.5% Phenylephrine @ 8:07 AM        Slit Lamp and Fundus Exam    External Exam      Right Left   External Normal Normal       Slit Lamp Exam      Right Left   Lids/Lashes Normal Normal   Conjunctiva/Sclera White and quiet White and quiet   Cornea Clear Clear   Anterior Chamber Deep and quiet Deep and quiet   Iris Round and reactive Round and reactive   Lens Posterior chamber intraocular lens Posterior chamber intraocular lens   Anterior Vitreous Normal Normal       Fundus Exam      Right Left   Posterior Vitreous  Normal   Disc  Normal   C/D Ratio  0.3   Macula  Macular thickening, Retinal pigment epithelial mottling, Retinal pigment epithelial detachment, Epiretinal membrane   Vessels  Normal   Periphery  Normal          IMAGING AND PROCEDURES  Imaging and Procedures for 05/27/20  OCT, Retina - OU - Both Eyes       Right Eye Quality was good. Scan locations included subfoveal. Central Foveal Thickness: 257. Progression has been stable.   Left Eye Quality was good. Scan locations included subfoveal. Central Foveal Thickness: 377. Progression has worsened. Findings include abnormal foveal contour, cystoid macular edema.   Notes OS with persistent subretinal fluid and foveal elevation on the basis of CNVM, not changing on persistent repeat injection of intravitreal Eylea OS.  May be resistant now may consider change of medications  upon next visit       Intravitreal Injection, Pharmacologic Agent - OS -  Left Eye       Time Out 05/27/2020. 8:33 AM. Confirmed correct patient, procedure, site, and patient consented.   Anesthesia Topical anesthesia was used. Anesthetic medications included Akten 3.5%.   Procedure Preparation included Ofloxacin , 10% betadine to eyelids, 5% betadine to ocular surface. A 30 gauge needle was used.   Injection:  2 mg aflibercept Gretta Cool) SOLN   NDC: L6038910, Lot: [id number]   Route: Intravitreal, Site: Left Eye, Waste: 0 mg  Post-op Post injection exam found visual acuity of at least counting fingers. The patient tolerated the procedure well. There were no complications. The patient received written and verbal post procedure care education. Post injection medications were not given.                 ASSESSMENT/PLAN:  Exudative age-related macular degeneration of left eye with active choroidal neovascularization (HCC) OS with persistent subretinal fluid and foveal elevation on the basis of CNVM, not changing on persistent repeat injection of intravitreal Eylea OS.  May be resistant now may consider change of medications upon next visit      ICD-10-CM   1. Exudative age-related macular degeneration of left eye with active choroidal neovascularization (HCC)  H35.3221 OCT, Retina - OU - Both Eyes    Intravitreal Injection, Pharmacologic Agent - OS - Left Eye    aflibercept (EYLEA) SOLN 2 mg    1.  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  Repeat exam  left eye 5 weeks with intravitreal Avastin  3.  Ophthalmic Meds Ordered this visit:  Meds ordered this encounter  Medications   aflibercept (EYLEA) SOLN 2 mg       Return in about 5 weeks (around 07/01/2020) for dilate, OS, AVASTIN OCT, change med.  Patient  Instructions  Patient to report any change in visual acuity or distortions in either eye.    Explained the diagnoses, plan, and follow up with the patient and they expressed understanding.  Patient expressed understanding of the importance of proper follow up care.   Alford Highland Meigan Pates M.D. Diseases & Surgery of the Retina and Vitreous [facility] 05/27/20     Abbreviations: M myopia (nearsighted); A astigmatism; H hyperopia (farsighted); P presbyopia; Mrx spectacle prescription;  CTL contact lenses; OD right eye; OS left eye; OU both eyes  XT exotropia; ET esotropia; PEK punctate epithelial keratitis; PEE punctate epithelial erosions; DES dry eye syndrome; MGD meibomian gland dysfunction; ATs artificial tears; PFAT's preservative free artificial tears; NSC nuclear sclerotic cataract; PSC posterior subcapsular cataract; ERM epi-retinal membrane; PVD posterior vitreous detachment; RD retinal detachment; DM diabetes mellitus; DR diabetic retinopathy; NPDR non-proliferative diabetic retinopathy; PDR proliferative diabetic retinopathy; CSME clinically significant macular edema; DME diabetic macular edema; dbh dot blot hemorrhages; CWS cotton wool spot; POAG primary open angle glaucoma; C/D cup-to-disc ratio; HVF humphrey visual field; GVF goldmann visual field; OCT optical coherence tomography; IOP intraocular pressure; BRVO Branch retinal vein occlusion; CRVO central retinal vein occlusion; CRAO central retinal artery occlusion; BRAO branch retinal artery occlusion; RT retinal tear; SB scleral buckle; PPV pars plana vitrectomy; VH Vitreous hemorrhage; PRP panretinal laser photocoagulation; IVK intravitreal kenalog; VMT vitreomacular traction; MH Macular hole;  NVD neovascularization of the disc; NVE neovascularization elsewhere; AREDS age related eye disease study; ARMD age related macular degeneration; POAG primary open angle glaucoma; EBMD epithelial/anterior basement membrane dystrophy;  ACIOL anterior chamber intraocular lens; IOL intraocular lens; PCIOL posterior chamber intraocular lens; Phaco/IOL phacoemulsification with intraocular lens placement; [place] photorefractive keratectomy; LASIK laser assisted in situ keratomileusis; HTN hypertension; DM diabetes mellitus; COPD chronic obstructive pulmonary disease

## 2020-05-28 NOTE — Telephone Encounter (Signed)
Only needs to take if having pain

## 2020-06-11 ENCOUNTER — Ambulatory Visit: Payer: 59 | Admitting: Podiatry

## 2020-06-16 ENCOUNTER — Other Ambulatory Visit: Payer: 59 | Admitting: Orthotics

## 2020-06-18 ENCOUNTER — Ambulatory Visit: Payer: 59 | Admitting: Podiatry

## 2020-06-26 ENCOUNTER — Ambulatory Visit: Payer: 59 | Admitting: Podiatry

## 2020-06-26 ENCOUNTER — Other Ambulatory Visit: Payer: Self-pay

## 2020-06-26 ENCOUNTER — Encounter: Payer: Self-pay | Admitting: Podiatry

## 2020-06-26 DIAGNOSIS — M7662 Achilles tendinitis, left leg: Secondary | ICD-10-CM

## 2020-06-26 NOTE — Progress Notes (Signed)
Subjective:   Patient ID: [name], male   DOB: 64 y.o.   MRN: [id number]   HPI Patient states overall he is doing pretty well but he is got one spot on his left heel that he feels like if he can get better he would be in great shape   ROS      Objective:  Physical Exam  Neurovascular status intact with patient's left posterior lateral heel showing 1 area of inflammation that is painful     Assessment:  Achilles tendinitis left with one area that has remained stubborn and painful     Plan:  H&P reviewed condition and we are can do 1 more very careful steroid injection after I explained chances for rupture.  I did not put it into the tendon but kept in the side 3 mg Dexasone 5 g liken I advised him on reduced activity immobilization and will reappoint as needed

## 2020-07-06 ENCOUNTER — Other Ambulatory Visit: Payer: Self-pay

## 2020-07-06 ENCOUNTER — Ambulatory Visit (INDEPENDENT_AMBULATORY_CARE_PROVIDER_SITE_OTHER): Payer: 59 | Admitting: Ophthalmology

## 2020-07-06 ENCOUNTER — Encounter (INDEPENDENT_AMBULATORY_CARE_PROVIDER_SITE_OTHER): Payer: Self-pay | Admitting: Ophthalmology

## 2020-07-06 DIAGNOSIS — H353221 Exudative age-related macular degeneration, left eye, with active choroidal neovascularization: Secondary | ICD-10-CM

## 2020-07-06 DIAGNOSIS — H35712 Central serous chorioretinopathy, left eye: Secondary | ICD-10-CM | POA: Diagnosis not present

## 2020-07-06 MED ORDER — BEVACIZUMAB CHEMO INJECTION 1.25MG/0.05ML SYRINGE FOR KALEIDOSCOPE
1.2500 mg | INTRAVITREAL | Status: AC | PRN
  Administered 2020-07-06: 1.25 mg via INTRAVITREAL

## 2020-07-06 NOTE — Assessment & Plan Note (Signed)
Chronic subretinal fluid and intraretinal fluid with subfoveal disciform type scar and hyper reflective material.  Sent and recurrent on Eylea.  We will attempt to change due to possible resistance and use intravitreal Avastin OS today

## 2020-07-06 NOTE — Assessment & Plan Note (Signed)
This component of disease left eye is chronic and inferiorly not in the fovea.

## 2020-07-06 NOTE — Patient Instructions (Signed)
Patient instructed to report new onset visual acuity decline or distortions in either eye

## 2020-07-06 NOTE — Progress Notes (Signed)
07/06/2020     CHIEF COMPLAINT Patient presents for Retina Follow Up   HISTORY OF PRESENT ILLNESS: [name] is a 64 y.o. male who presents to the clinic today for:   HPI    Retina Follow Up    Patient presents with  Wet AMD.  In left eye.  This started 6 weeks ago.  Severity is mild.  Duration of 6 weeks.  Since onset it is stable.          Comments    6 Week AMD F/U OS, poss Avastin OS  Pt denies noticeable changes to [place] OU since last visit. Pt denies ocular pain, flashes of light, or floaters OU.         Last edited by Ileana Roup, COA on 07/06/2020  8:30 AM. (History)      Referring physician: Emi Belfast, FNP [address] [place],  [place] [address]  HISTORICAL INFORMATION:   Selected notes from the MEDICAL RECORD NUMBER    Lab Results  Component Value Date   HGBA1C 5.4 08/09/2019     CURRENT MEDICATIONS: Current Outpatient Medications (Ophthalmic Drugs)  Medication Sig  . Aflibercept 2 MG/0.05ML SOLN Inject 1 Dose into the eye every 8 (eight) weeks.    No current facility-administered medications for this visit. (Ophthalmic Drugs)   Current Outpatient Medications (Other)  Medication Sig  . acetaminophen (TYLENOL) 500 MG tablet Take 500-1,000 mg by mouth every 6 (six) hours as needed (for pain.).  Marland Kitchen aspirin EC 81 MG tablet Take 81 mg by mouth daily.  . diclofenac (VOLTAREN) 75 MG EC tablet TAKE 1 TABLET BY MOUTH TWICE A DAY  . Fluticasone-Umeclidin-Vilant (TRELEGY ELLIPTA) 100-62.5-25 MCG/INH AEPB Inhale 1 puff into the lungs daily. Rinse mouth  . Fluticasone-Umeclidin-Vilant (TRELEGY ELLIPTA) 200-62.5-25 MCG/INH AEPB Inhale 1 puff into the lungs daily.  Marland Kitchen ipratropium-albuterol (DUONEB) 0.5-2.5 (3) MG/3ML SOLN INHALE THE CONTENTS OF 1 VIAL (3 ML) VIANEBULIZER EVERY 6 HOURS AS NEEDED  . ketoconazole (NIZORAL) 2 % cream Apply 1 application topically daily as needed for irritation.   Marland Kitchen lisinopril (ZESTRIL) 20 MG tablet TAKE 1 TABLET BY  MOUTH ONCE A DAY  . Multiple Vitamin (MULTIVITAMIN WITH MINERALS) TABS tablet Take 1 tablet by mouth daily.  . pantoprazole (PROTONIX) 20 MG tablet Take 1 tablet (20 mg total) by mouth daily.  . pravastatin (PRAVACHOL) 20 MG tablet Take 20 mg by mouth daily.  Marland Kitchen PROAIR HFA 108 (90 Base) MCG/ACT inhaler INHALE 2 PUFFS INTO THE LUNGS EVERY 6 HOURS AS NEEDED FOR WHEEZING ORSHORTNESS OF BREATH.  Marland Kitchen tiZANidine (ZANAFLEX) 4 MG tablet Take 1 tablet (4 mg total) by mouth every 8 (eight) hours as needed for muscle spasms.   Current Facility-Administered Medications (Other)  Medication Route  . triamcinolone acetonide (KENALOG) 10 MG/ML injection 10 mg Other      REVIEW OF SYSTEMS:    ALLERGIES No Known Allergies  PAST MEDICAL HISTORY Past Medical History:  Diagnosis Date  . COPD (chronic obstructive pulmonary disease) (HCC)   . GERD (gastroesophageal reflux disease)   . History of hepatitis C    treated several years ago through [place]  . Hypertension    Past Surgical History:  Procedure Laterality Date  . CATARACT EXTRACTION, BILATERAL    . EYE SURGERY Bilateral   . ORTHOPEDIC SURGERY Bilateral    pt states fracture repairs to arms and legs.  Marland Kitchen REVERSE SHOULDER ARTHROPLASTY Left 09/23/2019   Procedure: REVERSE SHOULDER ARTHROPLASTY;  Surgeon: Jones Broom, MD;  Location: [facility];  Service: Orthopedics;  Laterality: Left;    FAMILY HISTORY Family History  Problem Relation Age of Onset  . Heart disease Mother   . Hypertension Father     SOCIAL HISTORY Social History   Tobacco Use  . Smoking status: Former Smoker    Packs/day: 1.00    Years: 35.00    Pack years: 35.00    Types: Cigarettes    Quit date: 10/01/2010    Years since quitting: 9.7  . Smokeless tobacco: Never Used  Vaping Use  . Vaping Use: Never used  Substance Use Topics  . Alcohol use: Yes    Alcohol/week: 42.0 standard drinks    Types: 42 Cans of beer per week    Comment: daily - 6 drinks a day  . Drug  use: Yes    Types: Marijuana    Comment: last marijuana 2-3 weeks ago         OPHTHALMIC EXAM: Base Eye Exam    Visual Acuity (ETDRS)      Right Left   Dist [place] 20/25 -2 20/400   Dist ph [place]  20/200       Tonometry (Tonopen, 8:30 AM)      Right Left   Pressure 10 08       Pupils      Pupils Dark Light Shape React APD   Right PERRL 3 2 Round Brisk None   Left PERRL 3 2 Round Brisk None       Visual Fields (Counting fingers)      Left Right    Full Full       Extraocular Movement      Right Left    Full Full       Neuro/Psych    Oriented x3: Yes   Mood/Affect: Normal       Dilation    Left eye: 1.0% Mydriacyl, 2.5% Phenylephrine @ 8:33 AM        Slit Lamp and Fundus Exam    External Exam      Right Left   External Normal Normal       Slit Lamp Exam      Right Left   Lids/Lashes Normal Normal   Conjunctiva/Sclera White and quiet White and quiet   Cornea Clear Clear   Anterior Chamber Deep and quiet Deep and quiet   Iris Round and reactive Round and reactive   Lens Posterior chamber intraocular lens Posterior chamber intraocular lens   Anterior Vitreous Normal Normal       Fundus Exam      Right Left   Posterior Vitreous  Normal   Disc  Normal   C/D Ratio  0.3   Macula  Macular thickening, Retinal pigment epithelial mottling, Retinal pigment epithelial detachment, Epiretinal membrane, old serous rd inferior macula   Vessels  Normal   Periphery  Normal          IMAGING AND PROCEDURES  Imaging and Procedures for 07/06/20  OCT, Retina - OU - Both Eyes       Right Eye Quality was good. Scan locations included subfoveal. Central Foveal Thickness: 257. Progression has been stable. Findings include epiretinal membrane, no SRF, no IRF, retinal drusen .   Left Eye Quality was good. Scan locations included subfoveal. Central Foveal Thickness: 365. Progression has improved. Findings include abnormal foveal contour, cystoid macular edema,  subretinal fluid.   Notes OS with persistent subretinal fluid and foveal elevation on  the basis of CNVM, stable on persistent repeat injection of intravitreal Eylea OS.    OD with minor epiretinal membrane temporal aspect of the fovea yet no effect on fovea.       Intravitreal Injection, Pharmacologic Agent - OS - Left Eye       Time Out 07/06/2020. 9:08 AM. Confirmed correct patient, procedure, site, and patient consented.   Anesthesia Topical anesthesia was used. Anesthetic medications included Akten 3.5%.   Procedure Preparation included Ofloxacin , 10% betadine to eyelids, 5% betadine to ocular surface. A 30 gauge needle was used.   Injection:  1.25 mg Bevacizumab (AVASTIN) SOLN   NDC: 70360-001-02, Lot: [id number]   Route: Intravitreal, Site: Left Eye, Waste: 0 mg  Post-op Post injection exam found visual acuity of at least counting fingers. The patient tolerated the procedure well. There were no complications. The patient received written and verbal post procedure care education. Post injection medications were not given.                 ASSESSMENT/PLAN:  Central serous chorioretinopathy of left eye This component of disease left eye is chronic and inferiorly not in the fovea.  Exudative age-related macular degeneration of left eye with active choroidal neovascularization (HCC) Chronic subretinal fluid and intraretinal fluid with subfoveal disciform type scar and hyper reflective material.  Sent and recurrent on Eylea.  We will attempt to change due to possible resistance and use intravitreal Avastin OS today      ICD-10-CM   1. Exudative age-related macular degeneration of left eye with active choroidal neovascularization (HCC)  H35.3221 OCT, Retina - OU - Both Eyes    Intravitreal Injection, Pharmacologic Agent - OS - Left Eye    Bevacizumab (AVASTIN) SOLN 1.25 mg  2. Central serous chorioretinopathy of left eye  H35.712     1.  Persistent and recurrent  subretinal fluid left eye on intravitreal Eylea for long duration, now apparently resistant. 2.  Commence intravitreal Avastin OS today.  3.  Ophthalmic Meds Ordered this visit:  Meds ordered this encounter  Medications  . Bevacizumab (AVASTIN) SOLN 1.25 mg       Return in about 5 weeks (around 08/10/2020) for dilate, OS, AVASTIN OCT.  There are no Patient Instructions on file for this visit.   Explained the diagnoses, plan, and follow up with the patient and they expressed understanding.  Patient expressed understanding of the importance of proper follow up care.   Alford Highland Sanya Kobrin M.D. Diseases & Surgery of the Retina and Vitreous [facility] 07/06/20     Abbreviations: M myopia (nearsighted); A astigmatism; H hyperopia (farsighted); P presbyopia; Mrx spectacle prescription;  CTL contact lenses; OD right eye; OS left eye; OU both eyes  XT exotropia; ET esotropia; PEK punctate epithelial keratitis; PEE punctate epithelial erosions; DES dry eye syndrome; MGD meibomian gland dysfunction; ATs artificial tears; PFAT's preservative free artificial tears; NSC nuclear sclerotic cataract; PSC posterior subcapsular cataract; ERM epi-retinal membrane; PVD posterior vitreous detachment; RD retinal detachment; DM diabetes mellitus; DR diabetic retinopathy; NPDR non-proliferative diabetic retinopathy; PDR proliferative diabetic retinopathy; CSME clinically significant macular edema; DME diabetic macular edema; dbh dot blot hemorrhages; CWS cotton wool spot; POAG primary open angle glaucoma; C/D cup-to-disc ratio; HVF humphrey visual field; GVF goldmann visual field; OCT optical coherence tomography; IOP intraocular pressure; BRVO Branch retinal vein occlusion; CRVO central retinal vein occlusion; CRAO central retinal artery occlusion; BRAO branch retinal artery occlusion; RT retinal tear; SB scleral buckle; PPV pars  plana vitrectomy; VH Vitreous hemorrhage; PRP panretinal laser  photocoagulation; IVK intravitreal kenalog; VMT vitreomacular traction; MH Macular hole;  NVD neovascularization of the disc; NVE neovascularization elsewhere; AREDS age related eye disease study; ARMD age related macular degeneration; POAG primary open angle glaucoma; EBMD epithelial/anterior basement membrane dystrophy; ACIOL anterior chamber intraocular lens; IOL intraocular lens; PCIOL posterior chamber intraocular lens; Phaco/IOL phacoemulsification with intraocular lens placement; [employer] photorefractive keratectomy; LASIK laser assisted in situ keratomileusis; HTN hypertension; DM diabetes mellitus; COPD chronic obstructive pulmonary disease

## 2020-07-23 ENCOUNTER — Ambulatory Visit: Payer: 59 | Admitting: Podiatry

## 2020-07-23 ENCOUNTER — Encounter (INDEPENDENT_AMBULATORY_CARE_PROVIDER_SITE_OTHER): Payer: 59 | Admitting: Ophthalmology

## 2020-07-29 ENCOUNTER — Other Ambulatory Visit: Payer: Self-pay

## 2020-07-29 ENCOUNTER — Ambulatory Visit: Payer: 59 | Admitting: Podiatry

## 2020-07-29 ENCOUNTER — Encounter: Payer: Self-pay | Admitting: Podiatry

## 2020-07-29 DIAGNOSIS — M7662 Achilles tendinitis, left leg: Secondary | ICD-10-CM

## 2020-07-29 DIAGNOSIS — M779 Enthesopathy, unspecified: Secondary | ICD-10-CM

## 2020-07-29 DIAGNOSIS — G629 Polyneuropathy, unspecified: Secondary | ICD-10-CM

## 2020-07-29 MED ORDER — PREDNISONE 10 MG PO TABS: ORAL_TABLET | ORAL | 0 refills | Status: DC

## 2020-07-29 MED ORDER — GABAPENTIN 300 MG PO CAPS: 300.0000 mg | ORAL_CAPSULE | Freq: Three times a day (TID) | ORAL | 3 refills | Status: DC

## 2020-08-03 NOTE — Progress Notes (Signed)
Subjective:   Patient ID: [name], male   DOB: 64 y.o.   MRN: [id number]   HPI Patient states the one area of his heel that we worked out is modestly improved but he just seems to have a lot of pain in his foot and he is trying to stay active neuro   ROS      Objective:  Physical Exam  Vascular status intact with patient's left heel and foot improved but still painful upon palpation     Assessment:  Continued posterior pain left heel along with moderate fascial pain and forefoot pain left over right     Plan:  Reviewed different possibilities including the possibility that there may be some neuropathic or chronic type pain symptom associated with this and at this time I have recommended gabapentin to try to see how much this would help.  Patient will be seen back for [place] to recheck and is encouraged to call with questions and I educated him on gabapentin and how I want him to use it

## 2020-08-10 ENCOUNTER — Encounter (INDEPENDENT_AMBULATORY_CARE_PROVIDER_SITE_OTHER): Payer: Self-pay | Admitting: Ophthalmology

## 2020-08-10 ENCOUNTER — Ambulatory Visit (INDEPENDENT_AMBULATORY_CARE_PROVIDER_SITE_OTHER): Payer: 59 | Admitting: Ophthalmology

## 2020-08-10 ENCOUNTER — Other Ambulatory Visit: Payer: Self-pay

## 2020-08-10 DIAGNOSIS — H353221 Exudative age-related macular degeneration, left eye, with active choroidal neovascularization: Secondary | ICD-10-CM

## 2020-08-10 DIAGNOSIS — H353211 Exudative age-related macular degeneration, right eye, with active choroidal neovascularization: Secondary | ICD-10-CM | POA: Diagnosis not present

## 2020-08-10 MED ORDER — BEVACIZUMAB CHEMO INJECTION 1.25MG/0.05ML SYRINGE FOR KALEIDOSCOPE
1.2500 mg | INTRAVITREAL | Status: AC | PRN
  Administered 2020-08-10: 1.25 mg via INTRAVITREAL

## 2020-08-10 NOTE — Progress Notes (Signed)
08/10/2020     CHIEF COMPLAINT Patient presents for Retina Follow Up   HISTORY OF PRESENT ILLNESS: [name] is a 64 y.o. male who presents to the clinic today for:   HPI    Retina Follow Up    Patient presents with  Wet AMD.  In left eye.  This started 5 weeks ago.  Severity is mild.  Duration of 5 weeks.  Since onset it is stable.          Comments    5 Week AMD F/U OS, poss Avastin OS  Pt denies noticeable changes to [place] OU since last visit. Pt denies ocular pain, flashes of light, or floaters OU.         Last edited by Ileana Roup, COA on 08/10/2020  8:17 AM. (History)      Referring physician: Emi Belfast, FNP [address] [place],  [place] [address]  HISTORICAL INFORMATION:   Selected notes from the MEDICAL RECORD NUMBER    Lab Results  Component Value Date   HGBA1C 5.4 08/09/2019     CURRENT MEDICATIONS: Current Outpatient Medications (Ophthalmic Drugs)  Medication Sig  . Aflibercept 2 MG/0.05ML SOLN Inject 1 Dose into the eye every 8 (eight) weeks.    No current facility-administered medications for this visit. (Ophthalmic Drugs)   Current Outpatient Medications (Other)  Medication Sig  . acetaminophen (TYLENOL) 500 MG tablet Take 500-1,000 mg by mouth every 6 (six) hours as needed (for pain.).  Marland Kitchen aspirin EC 81 MG tablet Take 81 mg by mouth daily.  . diclofenac (VOLTAREN) 75 MG EC tablet TAKE 1 TABLET BY MOUTH TWICE A DAY  . Fluticasone-Umeclidin-Vilant (TRELEGY ELLIPTA) 100-62.5-25 MCG/INH AEPB Inhale 1 puff into the lungs daily. Rinse mouth  . Fluticasone-Umeclidin-Vilant (TRELEGY ELLIPTA) 200-62.5-25 MCG/INH AEPB Inhale 1 puff into the lungs daily.  Marland Kitchen gabapentin (NEURONTIN) 300 MG capsule Take 1 capsule (300 mg total) by mouth 3 (three) times daily.  Marland Kitchen ipratropium-albuterol (DUONEB) 0.5-2.5 (3) MG/3ML SOLN INHALE THE CONTENTS OF 1 VIAL (3 ML) VIANEBULIZER EVERY 6 HOURS AS NEEDED  . ketoconazole (NIZORAL) 2 % cream Apply 1  application topically daily as needed for irritation.   Marland Kitchen lisinopril (ZESTRIL) 20 MG tablet TAKE 1 TABLET BY MOUTH ONCE A DAY  . Multiple Vitamin (MULTIVITAMIN WITH MINERALS) TABS tablet Take 1 tablet by mouth daily.  . pantoprazole (PROTONIX) 20 MG tablet Take 1 tablet (20 mg total) by mouth daily.  . pravastatin (PRAVACHOL) 20 MG tablet Take 20 mg by mouth daily.  . predniSONE (DELTASONE) 10 MG tablet 12 day tapering dose  . PROAIR HFA 108 (90 Base) MCG/ACT inhaler INHALE 2 PUFFS INTO THE LUNGS EVERY 6 HOURS AS NEEDED FOR WHEEZING ORSHORTNESS OF BREATH.  Marland Kitchen tiZANidine (ZANAFLEX) 4 MG tablet Take 1 tablet (4 mg total) by mouth every 8 (eight) hours as needed for muscle spasms.   Current Facility-Administered Medications (Other)  Medication Route  . triamcinolone acetonide (KENALOG) 10 MG/ML injection 10 mg Other      REVIEW OF SYSTEMS:    ALLERGIES No Known Allergies  PAST MEDICAL HISTORY Past Medical History:  Diagnosis Date  . COPD (chronic obstructive pulmonary disease) (HCC)   . GERD (gastroesophageal reflux disease)   . History of hepatitis C    treated several years ago through [place]  . Hypertension    Past Surgical History:  Procedure Laterality Date  . CATARACT EXTRACTION, BILATERAL    . EYE SURGERY Bilateral   .  ORTHOPEDIC SURGERY Bilateral    pt states fracture repairs to arms and legs.  Marland Kitchen REVERSE SHOULDER ARTHROPLASTY Left 09/23/2019   Procedure: REVERSE SHOULDER ARTHROPLASTY;  Surgeon: Jones Broom, MD;  Location: [facility];  Service: Orthopedics;  Laterality: Left;    FAMILY HISTORY Family History  Problem Relation Age of Onset  . Heart disease Mother   . Hypertension Father     SOCIAL HISTORY Social History   Tobacco Use  . Smoking status: Former Smoker    Packs/day: 1.00    Years: 35.00    Pack years: 35.00    Types: Cigarettes    Quit date: 10/01/2010    Years since quitting: 9.8  . Smokeless tobacco: Never Used  Vaping Use  . Vaping Use:  Never used  Substance Use Topics  . Alcohol use: Yes    Alcohol/week: 42.0 standard drinks    Types: 42 Cans of beer per week    Comment: daily - 6 drinks a day  . Drug use: Yes    Types: Marijuana    Comment: last marijuana 2-3 weeks ago         OPHTHALMIC EXAM:  Base Eye Exam    Visual Acuity (ETDRS)      Right Left   Dist [place] 20/30 -2 20/400   Dist ph [place] NI NI       Tonometry (Tonopen, 8:17 AM)      Right Left   Pressure 14 15       Pupils      Pupils Dark Light Shape React APD   Right PERRL 3 2 Round Brisk None   Left PERRL 3 2 Round Brisk None       Visual Fields (Counting fingers)      Left Right    Full Full       Extraocular Movement      Right Left    Full Full       Neuro/Psych    Oriented x3: Yes   Mood/Affect: Normal       Dilation    Left eye: 1.0% Mydriacyl, 2.5% Phenylephrine @ 8:20 AM        Slit Lamp and Fundus Exam    External Exam      Right Left   External Normal Normal       Slit Lamp Exam      Right Left   Lids/Lashes Normal Normal   Conjunctiva/Sclera White and quiet White and quiet   Cornea Clear Clear   Anterior Chamber Deep and quiet Deep and quiet   Iris Round and reactive Round and reactive   Lens Posterior chamber intraocular lens Posterior chamber intraocular lens   Anterior Vitreous Normal Normal       Fundus Exam      Right Left   Posterior Vitreous  Normal   Disc  Normal   C/D Ratio  0.3   Macula  Macular thickening, Retinal pigment epithelial mottling, Retinal pigment epithelial detachment, Epiretinal membrane, old serous rd inferior macula   Vessels  Normal   Periphery  Normal          IMAGING AND PROCEDURES  Imaging and Procedures for 08/10/20  OCT, Retina - OU - Both Eyes       Right Eye Quality was good. Scan locations included subfoveal. Central Foveal Thickness: 257. Progression has worsened. Findings include subretinal fluid.   Left Eye Quality was good. Scan locations included  subfoveal. Central Foveal Thickness: 365. Progression has worsened.  Notes OD with new subretinal fluid,, will need to commence evaluation therapy in the near future OD.  OS with new onset thickening inferior to the fovea, Out of proportion and worse than last visit       Intravitreal Injection, Pharmacologic Agent - OS - Left Eye       Time Out 08/10/2020. 9:28 AM. Confirmed correct patient, procedure, site, and patient consented.   Anesthesia Topical anesthesia was used. Anesthetic medications included Akten 3.5%.   Procedure Preparation included Ofloxacin , 10% betadine to eyelids, 5% betadine to ocular surface. A 30 gauge needle was used.   Injection:  1.25 mg Bevacizumab (AVASTIN) SOLN   NDC: 70360-001-02   Route: Intravitreal, Site: Left Eye, Waste: 0 mg  Post-op Post injection exam found visual acuity of at least counting fingers. The patient tolerated the procedure well. There were no complications. The patient received written and verbal post procedure care education. Post injection medications were not given.                 ASSESSMENT/PLAN:  Exudative age-related macular degeneration of right eye with active choroidal neovascularization (HCC) Newly active subretinal fluid OD, will need to commence therapy soon  Exudative age-related macular degeneration of left eye with active choroidal neovascularization (HCC) Subretinal fluid persistent centrally OU as increasing inferiorly, will repeat injection today with high-dose Avastin, and will consider therapeutic change next      ICD-10-CM   1. Exudative age-related macular degeneration of left eye with active choroidal neovascularization (HCC)  H35.3221 OCT, Retina - OU - Both Eyes    Intravitreal Injection, Pharmacologic Agent - OS - Left Eye    Bevacizumab (AVASTIN) SOLN 1.25 mg  2. Exudative age-related macular degeneration of right eye with active choroidal neovascularization (HCC)  H35.3211      1.  2.  3.  Ophthalmic Meds Ordered this visit:  Meds ordered this encounter  Medications  . Bevacizumab (AVASTIN) SOLN 1.25 mg       Return in about 8 days (around 08/18/2020) for DILATE OU, OPTOS FFA R/L, COLOR FP, OCT, EYLEA OCT, OD.  There are no Patient Instructions on file for this visit.   Explained the diagnoses, plan, and follow up with the patient and they expressed understanding.  Patient expressed understanding of the importance of proper follow up care.   Alford Highland Fern Canova M.D. Diseases & Surgery of the Retina and Vitreous [facility] 08/10/20     Abbreviations: M myopia (nearsighted); A astigmatism; H hyperopia (farsighted); P presbyopia; Mrx spectacle prescription;  CTL contact lenses; OD right eye; OS left eye; OU both eyes  XT exotropia; ET esotropia; PEK punctate epithelial keratitis; PEE punctate epithelial erosions; DES dry eye syndrome; MGD meibomian gland dysfunction; ATs artificial tears; PFAT's preservative free artificial tears; NSC nuclear sclerotic cataract; PSC posterior subcapsular cataract; ERM epi-retinal membrane; PVD posterior vitreous detachment; RD retinal detachment; DM diabetes mellitus; DR diabetic retinopathy; NPDR non-proliferative diabetic retinopathy; PDR proliferative diabetic retinopathy; CSME clinically significant macular edema; DME diabetic macular edema; dbh dot blot hemorrhages; CWS cotton wool spot; POAG primary open angle glaucoma; C/D cup-to-disc ratio; HVF humphrey visual field; GVF goldmann visual field; OCT optical coherence tomography; IOP intraocular pressure; BRVO Branch retinal vein occlusion; CRVO central retinal vein occlusion; CRAO central retinal artery occlusion; BRAO branch retinal artery occlusion; RT retinal tear; SB scleral buckle; PPV pars plana vitrectomy; VH Vitreous hemorrhage; PRP panretinal laser photocoagulation; IVK intravitreal kenalog; VMT vitreomacular traction; MH Macular hole;  NVD  neovascularization of the disc; NVE neovascularization elsewhere; AREDS age related eye disease study; ARMD age related macular degeneration; POAG primary open angle glaucoma; EBMD epithelial/anterior basement membrane dystrophy; ACIOL anterior chamber intraocular lens; IOL intraocular lens; PCIOL posterior chamber intraocular lens; Phaco/IOL phacoemulsification with intraocular lens placement; [place] photorefractive keratectomy; LASIK laser assisted in situ keratomileusis; HTN hypertension; DM diabetes mellitus; COPD chronic obstructive pulmonary disease

## 2020-08-10 NOTE — Assessment & Plan Note (Signed)
Subretinal fluid persistent centrally OU as increasing inferiorly, will repeat injection today with high-dose Avastin, and will consider therapeutic change next

## 2020-08-10 NOTE — Assessment & Plan Note (Signed)
Newly active subretinal fluid OD, will need to commence therapy soon

## 2020-08-12 ENCOUNTER — Other Ambulatory Visit: Payer: Self-pay | Admitting: Family Medicine

## 2020-08-12 DIAGNOSIS — E785 Hyperlipidemia, unspecified: Secondary | ICD-10-CM

## 2020-08-12 NOTE — Telephone Encounter (Signed)
Pharmacy requests refill on: Pravastatin Sodium 20 mg   LAST REFILL: 08/08/2019 LAST OV: 02/28/2020 NEXT OV: 08/31/2020 PHARMACY: [facility]

## 2020-08-20 ENCOUNTER — Encounter (INDEPENDENT_AMBULATORY_CARE_PROVIDER_SITE_OTHER): Payer: Self-pay | Admitting: Ophthalmology

## 2020-08-20 ENCOUNTER — Ambulatory Visit (INDEPENDENT_AMBULATORY_CARE_PROVIDER_SITE_OTHER): Payer: 59 | Admitting: Ophthalmology

## 2020-08-20 ENCOUNTER — Other Ambulatory Visit: Payer: Self-pay

## 2020-08-20 DIAGNOSIS — H353211 Exudative age-related macular degeneration, right eye, with active choroidal neovascularization: Secondary | ICD-10-CM

## 2020-08-20 DIAGNOSIS — H353221 Exudative age-related macular degeneration, left eye, with active choroidal neovascularization: Secondary | ICD-10-CM | POA: Diagnosis not present

## 2020-08-20 MED ORDER — AFLIBERCEPT 2MG/0.05ML IZ SOLN FOR KALEIDOSCOPE
2.0000 mg | INTRAVITREAL | Status: AC | PRN
  Administered 2020-08-20: 2 mg via INTRAVITREAL

## 2020-08-20 MED ORDER — FLUORESCEIN SODIUM 10 % IV SOLN
500.0000 mg | INTRAVENOUS | Status: AC | PRN
  Administered 2020-08-20: 500 mg via INTRAVENOUS

## 2020-08-20 NOTE — Progress Notes (Signed)
08/20/2020     CHIEF COMPLAINT Patient presents for Retina Follow Up   HISTORY OF PRESENT ILLNESS: [name] is a 64 y.o. 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  This started 10 days ago.  Severity is mild.  Duration of 10 days.  Since onset it is stable.          Comments    10 Day F/U OU, FFA R/L, poss Eylea OD  Pt denies noticeable changes to [place] OU since last visit. Pt denies ocular pain, flashes of light, or floaters OU. Pt is on oxygen - compliant.        Last edited by Edmon Crapeankin, Augustin Bun A, MD on 08/20/2020 11:54 AM. (History)      Referring physician: Emi BelfastGessner, Deborah B, FNP [address] [place],  [place] [id number]  HISTORICAL INFORMATION:   Selected notes from the MEDICAL RECORD NUMBER    Lab Results  Component Value Date   HGBA1C 5.4 08/09/2019     CURRENT MEDICATIONS: Current Outpatient Medications (Ophthalmic Drugs)  Medication Sig  . Aflibercept 2 MG/0.05ML SOLN Inject 1 Dose into the eye every 8 (eight) weeks.    No current facility-administered medications for this visit. (Ophthalmic Drugs)   Current Outpatient Medications (Other)  Medication Sig  . acetaminophen (TYLENOL) 500 MG tablet Take 500-1,000 mg by mouth every 6 (six) hours as needed (for pain.).  Marland Kitchen. aspirin EC 81 MG tablet Take 81 mg by mouth daily.  . diclofenac (VOLTAREN) 75 MG EC tablet TAKE 1 TABLET BY MOUTH TWICE A DAY  . Fluticasone-Umeclidin-Vilant (TRELEGY ELLIPTA) 100-62.5-25 MCG/INH AEPB Inhale 1 puff into the lungs daily. Rinse mouth  . Fluticasone-Umeclidin-Vilant (TRELEGY ELLIPTA) 200-62.5-25 MCG/INH AEPB Inhale 1 puff into the lungs daily.  Marland Kitchen. gabapentin (NEURONTIN) 300 MG capsule Take 1 capsule (300 mg total) by mouth 3 (three) times daily.  Marland Kitchen. ipratropium-albuterol (DUONEB) 0.5-2.5 (3) MG/3ML SOLN INHALE THE CONTENTS OF 1 VIAL (3 ML) VIANEBULIZER EVERY 6 HOURS AS NEEDED  . ketoconazole  (NIZORAL) 2 % cream Apply 1 application topically daily as needed for irritation.   Marland Kitchen. lisinopril (ZESTRIL) 20 MG tablet TAKE 1 TABLET BY MOUTH ONCE A DAY  . Multiple Vitamin (MULTIVITAMIN WITH MINERALS) TABS tablet Take 1 tablet by mouth daily.  . pantoprazole (PROTONIX) 20 MG tablet Take 1 tablet (20 mg total) by mouth daily.  . pravastatin (PRAVACHOL) 20 MG tablet TAKE 1 TABLET BY MOUTH ONCE DAILY  . predniSONE (DELTASONE) 10 MG tablet 12 day tapering dose  . PROAIR HFA 108 (90 Base) MCG/ACT inhaler INHALE 2 PUFFS INTO THE LUNGS EVERY 6 HOURS AS NEEDED FOR WHEEZING ORSHORTNESS OF BREATH.  Marland Kitchen. tiZANidine (ZANAFLEX) 4 MG tablet Take 1 tablet (4 mg total) by mouth every 8 (eight) hours as needed for muscle spasms.   Current Facility-Administered Medications (Other)  Medication Route  . triamcinolone acetonide (KENALOG) 10 MG/ML injection 10 mg Other      REVIEW OF SYSTEMS:    ALLERGIES No Known Allergies  PAST MEDICAL HISTORY Past Medical History:  Diagnosis Date  . COPD (chronic obstructive pulmonary disease) (HCC)   . GERD (gastroesophageal reflux disease)   . History of hepatitis C    treated several years ago [facility]  . Hypertension    Past Surgical History:  Procedure Laterality Date  . CATARACT EXTRACTION, BILATERAL    .  EYE SURGERY Bilateral   . ORTHOPEDIC SURGERY Bilateral    pt states fracture repairs to arms and legs.  Marland Kitchen REVERSE SHOULDER ARTHROPLASTY Left 09/23/2019   Procedure: REVERSE SHOULDER ARTHROPLASTY;  Surgeon: Jones Broom, MD;  Location: [facility];  Service: Orthopedics;  Laterality: Left;    FAMILY HISTORY Family History  Problem Relation Age of Onset  . Heart disease Mother   . Hypertension Father     SOCIAL HISTORY Social History   Tobacco Use  . Smoking status: Former Smoker    Packs/day: 1.00    Years: 35.00    Pack years: 35.00    Types: Cigarettes    Quit date: 10/01/2010    Years since quitting: 9.8  . Smokeless tobacco: Never  Used  Vaping Use  . Vaping Use: Never used  Substance Use Topics  . Alcohol use: Yes    Alcohol/week: 42.0 standard drinks    Types: 42 Cans of beer per week    Comment: daily - 6 drinks a day  . Drug use: Yes    Types: Marijuana    Comment: last marijuana 2-3 weeks ago         OPHTHALMIC EXAM:  Base Eye Exam    Visual Acuity (ETDRS)      Right Left   Dist [place] 20/30 +2 20/200   Dist ph  NI 20/100       Tonometry (Tonopen, 10:44 AM)      Right Left   Pressure 22 22       Pupils      Pupils Dark Light Shape React APD   Right PERRL 3 2 Round Brisk None   Left PERRL 3 2 Round Brisk None       Visual Fields (Counting fingers)      Left Right    Full Full       Extraocular Movement      Right Left    Full Full       Neuro/Psych    Oriented x3: Yes   Mood/Affect: Normal       Dilation    Both eyes: 1.0% Mydriacyl, 2.5% Phenylephrine @ 10:47 AM        Slit Lamp and Fundus Exam    External Exam      Right Left   External Normal Normal       Slit Lamp Exam      Right Left   Lids/Lashes Normal Normal   Conjunctiva/Sclera White and quiet White and quiet   Cornea Clear Clear   Anterior Chamber Deep and quiet Deep and quiet   Iris Round and reactive Round and reactive   Lens Posterior chamber intraocular lens Posterior chamber intraocular lens   Anterior Vitreous Normal Normal       Fundus Exam      Right Left   Posterior Vitreous Vitrectomized Normal   Disc Normal Normal   C/D Ratio 0.3 0.3   Macula Retinal pigment epithelial mottling Macular thickening, Retinal pigment epithelial mottling, Retinal pigment epithelial detachment, Epiretinal membrane, old serous rd inferior macula   Vessels Normal Normal   Periphery Normal Normal          IMAGING AND PROCEDURES  Imaging and Procedures for 08/20/20  OCT, Retina - OU - Both Eyes       Right Eye Quality was good. Scan locations included subfoveal. Central Foveal Thickness: 324. Progression  has worsened. Findings include subretinal fluid.   Left Eye Quality was good. Scan locations included  subfoveal. Central Foveal Thickness: 365. Progression has improved. Findings include subretinal fluid, epiretinal membrane.   Notes OD with new subretinal fluid,, will need to commence evaluation therapy in the today OD.  OS with improved thickening, persistent subretinal fluid, some component of epiretinal membrane.,  Status post recent injection Eylea       Color Fundus Photography Optos - OU - Both Eyes       Right Eye Progression has been stable. Disc findings include normal observations. Macula : retinal pigment epithelium abnormalities. Vessels : normal observations. Periphery : normal observations.   Left Eye Progression has improved. Disc findings include normal observations. Macula : retinal pigment epithelium abnormalities, epiretinal membrane. Vessels : normal observations. Periphery : normal observations.   Notes OD, subretinal fluid serous detachment likely from CS CR yet in this age group most likely CNVM or rap-like lesion, will treat with intravitreal Eylea OD today  OS chronic subretinal fluid       Fluorescein Angiography Optos (Transit OD)       Injection:  500 mg Fluorescein Sodium 10 % injection   NDC: 9166919724   Route: Intravenous, Site: Right ArmRight Eye   Progression has worsened. Early phase findings include window defect, leakage. Mid/Late phase findings include leakage, window defect. Choroidal neovascularization is occult.   Left Eye Mid/Late phase findings include window defect. Choroidal neovascularization is occult.   Notes Multifocal RPE hotspots of "sick" RPE, central serous retinopathy.  These areas of leakage are superonasal and inferonasal to the macular region which accounts for the serous retinal detachment to right eye.  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 11:59 AM. Confirmed correct patient, procedure, site, and patient consented.   Anesthesia Topical anesthesia was used. Anesthetic medications included [place] 3.5%.   Procedure Preparation included 10% betadine to eyelids, 5% betadine to ocular surface. A 30 gauge needle was used.   Injection:  2 mg aflibercept Gretta Cool) SOLN   NDC: L6038910, Lot: [id number]   Route: Intravitreal, Site: Right Eye, Waste: 0 mg  Post-op Post injection exam found visual acuity of at least counting fingers. The patient tolerated the procedure well. There were no complications. The patient received written and verbal post procedure care education. Post injection medications were not given.                 ASSESSMENT/PLAN:  No problem-specific Assessment & Plan notes found for this encounter.      ICD-10-CM   1. Exudative age-related macular degeneration of right eye with active choroidal neovascularization (HCC)  H35.3211 OCT, Retina - OU - Both Eyes    Color Fundus Photography Optos - OU - Both Eyes    Fluorescein Angiography Optos (Transit OD)    Fluorescein Sodium 10 % injection 500 mg    Intravitreal Injection, Pharmacologic Agent - OD - Right Eye    aflibercept (EYLEA) SOLN 2 mg  2. Exudative age-related macular degeneration of left eye with active choroidal neovascularization (HCC)  H35.3221 OCT, Retina - OU - Both Eyes    Color Fundus Photography Optos - OU - Both Eyes    Fluorescein Angiography Optos (Transit OD)    Fluorescein Sodium 10 % injection  500 mg    1.  2.  3.  Ophthalmic Meds Ordered this visit:  Meds ordered this encounter  Medications  . Fluorescein Sodium 10 % injection 500 mg  . aflibercept (EYLEA) SOLN 2 mg       Return in about 5 weeks (around 09/24/2020)  for dilate, OD, EYLEA OCT.  There are no Patient Instructions on file for this visit.   Explained the diagnoses, plan, and follow up with the patient and they expressed understanding.  Patient expressed understanding of the importance of proper follow up care.   Alford Highland Marino Rogerson M.D. Diseases & Surgery of the Retina and Vitreous [facility] 08/20/20     Abbreviations: M myopia (nearsighted); A astigmatism; H hyperopia (farsighted); P presbyopia; Mrx spectacle prescription;  CTL contact lenses; OD right eye; OS left eye; OU both eyes  XT exotropia; ET esotropia; PEK punctate epithelial keratitis; PEE punctate epithelial erosions; DES dry eye syndrome; MGD meibomian gland dysfunction; ATs artificial tears; PFAT's preservative free artificial tears; NSC nuclear sclerotic cataract; PSC posterior subcapsular cataract; ERM epi-retinal membrane; PVD posterior vitreous detachment; RD retinal detachment; DM diabetes mellitus; DR diabetic retinopathy; NPDR non-proliferative diabetic retinopathy; PDR proliferative diabetic retinopathy; CSME clinically significant macular edema; DME diabetic macular edema; dbh dot blot hemorrhages; CWS cotton wool spot; POAG primary open angle glaucoma; C/D cup-to-disc ratio; HVF humphrey visual field; GVF goldmann visual field; OCT optical coherence tomography; IOP intraocular pressure; BRVO Branch retinal vein occlusion; CRVO central retinal vein occlusion; CRAO central retinal artery occlusion; BRAO branch retinal artery occlusion; RT retinal tear; SB scleral buckle; PPV pars plana vitrectomy; VH Vitreous hemorrhage; PRP panretinal laser photocoagulation; IVK intravitreal kenalog; VMT vitreomacular traction; MH Macular hole;  NVD neovascularization of the disc; NVE neovascularization elsewhere; AREDS age related eye disease study; ARMD age related macular degeneration; POAG primary open angle glaucoma; EBMD epithelial/anterior basement membrane dystrophy; ACIOL  anterior chamber intraocular lens; IOL intraocular lens; PCIOL posterior chamber intraocular lens; Phaco/IOL phacoemulsification with intraocular lens placement; PRK photorefractive keratectomy; LASIK laser assisted in situ keratomileusis; HTN hypertension; DM diabetes mellitus; COPD chronic obstructive pulmonary disease

## 2020-08-26 ENCOUNTER — Ambulatory Visit: Payer: 59 | Admitting: Podiatry

## 2020-08-26 ENCOUNTER — Other Ambulatory Visit: Payer: Self-pay

## 2020-08-26 ENCOUNTER — Encounter: Payer: Self-pay | Admitting: Podiatry

## 2020-08-26 DIAGNOSIS — M76822 Posterior tibial tendinitis, left leg: Secondary | ICD-10-CM

## 2020-08-26 DIAGNOSIS — M7662 Achilles tendinitis, left leg: Secondary | ICD-10-CM

## 2020-08-26 MED ORDER — DICLOFENAC SODIUM 75 MG PO TBEC: 75.0000 mg | DELAYED_RELEASE_TABLET | Freq: Two times a day (BID) | ORAL | 2 refills | Status: DC

## 2020-08-26 MED ORDER — TRIAMCINOLONE ACETONIDE 10 MG/ML IJ SUSP
10.0000 mg | Freq: Once | INTRAMUSCULAR | Status: AC
  Administered 2020-08-26: 10 mg

## 2020-08-26 NOTE — Progress Notes (Signed)
Subjective:   Patient ID: [name], male   DOB: 64 y.o.   MRN: [id number]   HPI Patient presents stating the Achilles seems somewhat better and also the nerve pain I was getting seems to be improved but I am getting pain on the inside of his right ankle   ROS      Objective:  Physical Exam  Neurovascular status intact with improvement of Achilles tendinitis right with inflammation pain of the posterior tibial tendon as it comes around the medial malleolus right     Assessment:  Possibility we may be dealing now with compensatory posterior tibial tendinitis versus Achilles tendinitis     Plan:  H&P reviewed condition sterile prep done and injected the posterior tibial tendon as it comes behind the medial malleolus 3 mg Kenalog 5 mg Xylocaine advised on ice therapy anti-inflammatories and will be seen back and may require further treatment depending on response

## 2020-08-31 ENCOUNTER — Encounter: Payer: Self-pay | Admitting: Family Medicine

## 2020-08-31 ENCOUNTER — Ambulatory Visit (INDEPENDENT_AMBULATORY_CARE_PROVIDER_SITE_OTHER): Payer: 59 | Admitting: Family Medicine

## 2020-08-31 ENCOUNTER — Other Ambulatory Visit: Payer: Self-pay

## 2020-08-31 VITALS — BP 136/86 | HR 90 | Temp 97.8°F | Ht 74.0 in | Wt 230.1 lb

## 2020-08-31 DIAGNOSIS — Z789 Other specified health status: Secondary | ICD-10-CM

## 2020-08-31 DIAGNOSIS — E785 Hyperlipidemia, unspecified: Secondary | ICD-10-CM

## 2020-08-31 DIAGNOSIS — Z7289 Other problems related to lifestyle: Secondary | ICD-10-CM

## 2020-08-31 DIAGNOSIS — K219 Gastro-esophageal reflux disease without esophagitis: Secondary | ICD-10-CM

## 2020-08-31 DIAGNOSIS — R7989 Other specified abnormal findings of blood chemistry: Secondary | ICD-10-CM | POA: Diagnosis not present

## 2020-08-31 DIAGNOSIS — E663 Overweight: Secondary | ICD-10-CM | POA: Diagnosis not present

## 2020-08-31 LAB — LIPID PANEL
Cholesterol: 180 mg/dL (ref 0–200)
HDL: 58.6 mg/dL (ref >39.00)
LDL Cholesterol: 104 mg/dL — ABNORMAL HIGH (ref 0–99)
NonHDL: 121.76
Total CHOL/HDL Ratio: 3
Triglycerides: 88 mg/dL (ref 0.0–149.0)
VLDL: 17.6 mg/dL (ref 0.0–40.0)

## 2020-08-31 LAB — COMPREHENSIVE METABOLIC PANEL
ALT: 41 U/L (ref 0–53)
AST: 33 U/L (ref 0–37)
Albumin: 4.4 g/dL (ref 3.5–5.2)
Alkaline Phosphatase: 68 U/L (ref 39–117)
BUN: 10 mg/dL (ref 6–23)
CO2: 29 mEq/L (ref 19–32)
Calcium: 9.2 mg/dL (ref 8.4–10.5)
Chloride: 105 mEq/L (ref 96–112)
Creatinine, Ser: 0.86 mg/dL (ref 0.40–1.50)
GFR: 91.86 mL/min (ref >60.00)
Glucose, Bld: 90 mg/dL (ref 70–99)
Potassium: 4.5 mEq/L (ref 3.5–5.1)
Sodium: 140 mEq/L (ref 135–145)
Total Bilirubin: 0.3 mg/dL (ref 0.2–1.2)
Total Protein: 7.6 g/dL (ref 6.0–8.3)

## 2020-08-31 LAB — HEMOGLOBIN A1C: Hgb A1c MFr Bld: 5.4 % (ref 4.6–6.5)

## 2020-08-31 NOTE — Patient Instructions (Addendum)
Can decrease your pantoprazole to every other day, for break through symptoms, can take Pepcid, Tums, etc. Try every other day for 1 month then go to as needed.   Follow up in 6 months for your annual exam  There is not one right eating plan for everyone.  It may take trial and error to find what will work for you.  It is important to get adequate protein and fiber with your meals.  It is okay to not eat breakfast or to skip meals if you are not hungry.  Avoid snacking between meals.  Unless you are on a fluid restriction, drink 80 to 90 ounces of water a day.  Suggested resources- www.dietdoctor.com/diabetes/diet www.adaptyourlifeacademy.com-there is a quiz to help you determine how many carbohydrates you should eat a day  www.thefastingmethod.com  If you have diabetes or access to a blood sugar machine, I recommend you check your blood sugar daily and keep a log.  Vary the time you check your blood sugar such as fasting, before meal, 2 hours after a meal and at bedtime.  Look for trends with the foods you are eating and be a scientist of your body.  Here are some guidelines to help you with meal planning -  Avoid all processed and packaged foods (bread, pasta, crackers, chips, etc) and beverages containing calories.  Avoid added sugars and excessive natural sugars.  Pay attention to how you feel if you consume artificial sweeteners.  Do they make you more hungry or raise your blood sugar?  With every meal and snack, aim to get 20 g of protein (3 ounces of meat, 4 ounces of fish, 3 eggs, protein powder, 1 cup [place] yogurt, 1 cup cottage cheese, etc.)  Increase fiber in the form of non-starchy vegetables.  These help you feel full with very little carbohydrates and are good for gut health.  Nonstarchy vegetables include summer squash, onions, peppers, tomatoes, eggplant, broccoli, cauliflower, cabbage, lettuce, spinach.  Have small amounts of good fats such as avocado, nuts, olive oil, nut  butters, olives.  Add a little cheese to your meals to make them tasty.   Try to plan your meals for the week and do some meal preparation when able.  If possible, make lunches for the week ahead of time.  Plan a couple of dinners and make enough so you can have leftovers.  Build in a treat once a week.

## 2020-08-31 NOTE — Progress Notes (Signed)
Subjective:    Patient ID: [name], male    DOB: [birth year], 64 y.o.   MRN: [id number]  HPI Chief Complaint  Patient presents with  . Follow-up   This is a 64 yo male who presents today for 6 month follow up.  Has been feeling ok, pain worse on bottom of his feet. Achilles tendon pain. Seeing podiatry. Was on prednisone.   GERD- taking pantoprazole 20 mg daily, no symptoms.  Prior, had to restrict his diet but reports now that he is able to eat anything without symptoms.  COPD- with overexertion. Has had Covid vaccines x 2. On the fence about booster.  Has regular pulmonary follow-up.  Elevated LFTs-noted at last visit.  Patient has gained some weight, was recently on prednisone.  He also reports that he drinks from 2-10 beers a night.  Has done this for many years.  Nocturia-he goes to bed between 9 and 10 and awakens between 3 and 4 to urinate.  Does not feel that he urinates excessively during the day.  Does not limit liquids prior to bedtime.  Overweight-he reports that he just had a holiday trip with his family and has not been eating very well.  His girlfriend who usually does a lot of cooking for both of them has been in the process of moving.  He reports that he eats breakfast most days, usually hard-boiled egg or a "bar."  Lunch is a sandwich and chips.  Dinner varies.  He likes about wide variety of foods.  He does not drink soda, juice or sweet tea.  Exercise has been decreased with foot pain.  Review of Systems Per HPI    Objective:   Physical Exam Physical Exam  Constitutional: Oriented to person, place, and time. Appears well-developed and well-nourished.  HENT:  Head: Normocephalic and atraumatic.  Eyes: Conjunctivae are normal.  Neck: Normal range of motion. Neck supple.  Cardiovascular: Normal rate, regular rhythm and normal heart sounds.   Pulmonary/Chest: Effort normal and breath sounds normal.  Musculoskeletal: No lower extremity edema.    Neurological: Alert and oriented to person, place, and time.  Skin: Skin is warm and dry.  Psychiatric: Normal mood and affect. Behavior is normal. Judgment and thought content normal.  Vitals reviewed.     BP 136/86 (BP Location: Left Arm, Patient Position: Sitting, Cuff Size: Large)   Pulse 90   Temp 97.8 F (36.6 C) (Temporal)   Ht 6\' 2"  (1.88 m)   Wt 230 lb 1.9 oz (104.4 kg)   SpO2 96%   BMI 29.55 kg/m  Wt Readings from Last 3 Encounters:  08/31/20 230 lb 1.9 oz (104.4 kg)  04/14/20 (!) 218 lb 9.6 oz (99.2 kg)  02/28/20 222 lb 1.9 oz (100.8 kg)       Assessment & Plan:  1. Elevated LFTs -Discussed weight loss and limiting alcohol use - Comprehensive metabolic panel  2. Hyperlipidemia, unspecified hyperlipidemia type - Lipid Panel  3. Overweight (BMI 25.0-29.9) -Provided verbal and written information regarding recommended dietary changes - Hemoglobin A1c  4. Gastroesophageal reflux disease, unspecified whether esophagitis present -Discussed weaning pantoprazole to every other day, provided written instructions  5. Alcohol use -Discussed recommended amount for males as no more than 2 drinks daily.  Discussed long-term effects of overuse as well as increased risk of certain cancers.  -Follow-up in 6 months for CPE  This visit occurred during the SARS-CoV-2 public health emergency.  Safety protocols were in place, including screening  questions prior to the visit, additional usage of staff PPE, and extensive cleaning of exam room while observing appropriate contact time as indicated for disinfecting solutions.      Olean Ree, FNP-BC   Primary Care at [facility], [facility]  08/31/2020 10:06 AM

## 2020-09-01 MED ORDER — PRAVASTATIN SODIUM 40 MG PO TABS: 40.0000 mg | ORAL_TABLET | Freq: Every day | ORAL | 3 refills | Status: DC

## 2020-09-01 NOTE — Addendum Note (Signed)
Addended by: Olean Ree B on: 09/01/2020 09:55 AM   Modules accepted: Orders

## 2020-09-24 ENCOUNTER — Ambulatory Visit (INDEPENDENT_AMBULATORY_CARE_PROVIDER_SITE_OTHER): Payer: 59 | Admitting: Ophthalmology

## 2020-09-24 ENCOUNTER — Encounter (INDEPENDENT_AMBULATORY_CARE_PROVIDER_SITE_OTHER): Payer: Self-pay | Admitting: Ophthalmology

## 2020-09-24 ENCOUNTER — Other Ambulatory Visit: Payer: Self-pay

## 2020-09-24 DIAGNOSIS — H353221 Exudative age-related macular degeneration, left eye, with active choroidal neovascularization: Secondary | ICD-10-CM | POA: Diagnosis not present

## 2020-09-24 DIAGNOSIS — G4733 Obstructive sleep apnea (adult) (pediatric): Secondary | ICD-10-CM | POA: Insufficient documentation

## 2020-09-24 DIAGNOSIS — H353211 Exudative age-related macular degeneration, right eye, with active choroidal neovascularization: Secondary | ICD-10-CM

## 2020-09-24 MED ORDER — AFLIBERCEPT 2MG/0.05ML IZ SOLN FOR KALEIDOSCOPE
2.0000 mg | INTRAVITREAL | Status: AC | PRN
  Administered 2020-09-24: 2 mg via INTRAVITREAL

## 2020-09-24 NOTE — Assessment & Plan Note (Addendum)
Today at 6-week follow-up, now subretinal fluid in the left eye has improved although present just a month ago  Each of these findings are coincident with with now excellent compliance with nightly oxygen "all night".  In previous months he would not replace it if it was displaced during the night  He has been using it nightly throughout the entire night for the past 5 weeks

## 2020-09-24 NOTE — Progress Notes (Signed)
09/24/2020     CHIEF COMPLAINT Patient presents for Retina Follow Up (5 Week AMD F/U OD, poss Eylea OD//Pt sts VA OD may be a little fuzzy, but sts he feels it's due to the tobacco dust at work. VA OS stable.)   HISTORY OF PRESENT ILLNESS: [name] is a 65 y.o. 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  This started 5 weeks ago.  Severity is mild.  Duration of 5 weeks.  Since onset it is stable. Additional comments: 5 Week AMD F/U OD, poss Eylea OD  Pt sts VA OD may be a little fuzzy, but sts he feels it's due to the tobacco dust at work. VA OS stable.       Last edited by Ileana Roup, COA on 09/24/2020 11:10 AM. (History)      Referring physician: Emi Belfast, FNP No address on file  HISTORICAL INFORMATION:   Selected notes from the MEDICAL RECORD NUMBER    Lab Results  Component Value Date   HGBA1C 5.4 08/31/2020     CURRENT MEDICATIONS: Current Outpatient Medications (Ophthalmic Drugs)  Medication Sig  . Aflibercept 2 MG/0.05ML SOLN Inject 1 Dose into the eye every 8 (eight) weeks.    No current facility-administered medications for this visit. (Ophthalmic Drugs)   Current Outpatient Medications (Other)  Medication Sig  . acetaminophen (TYLENOL) 500 MG tablet Take 500-1,000 mg by mouth every 6 (six) hours as needed (for pain.).  Marland Kitchen aspirin EC 81 MG tablet Take 81 mg by mouth daily.  . diclofenac (VOLTAREN) 75 MG EC tablet Take 1 tablet (75 mg total) by mouth 2 (two) times daily.  . Fluticasone-Umeclidin-Vilant (TRELEGY ELLIPTA) 100-62.5-25 MCG/INH AEPB Inhale 1 puff into the lungs daily. Rinse mouth  . Fluticasone-Umeclidin-Vilant (TRELEGY ELLIPTA) 200-62.5-25 MCG/INH AEPB Inhale 1 puff into the lungs daily.  Marland Kitchen gabapentin (NEURONTIN) 300 MG capsule Take 1 capsule (300 mg total) by mouth 3 (three) times daily.  Marland Kitchen ipratropium-albuterol (DUONEB) 0.5-2.5 (3) MG/3ML SOLN INHALE  THE CONTENTS OF 1 VIAL (3 ML) VIANEBULIZER EVERY 6 HOURS AS NEEDED  . lisinopril (ZESTRIL) 20 MG tablet TAKE 1 TABLET BY MOUTH ONCE A DAY  . Multiple Vitamin (MULTIVITAMIN WITH MINERALS) TABS tablet Take 1 tablet by mouth daily.  . pantoprazole (PROTONIX) 20 MG tablet Take 1 tablet (20 mg total) by mouth daily.  . pravastatin (PRAVACHOL) 40 MG tablet Take 1 tablet (40 mg total) by mouth daily.  Marland Kitchen PROAIR HFA 108 (90 Base) MCG/ACT inhaler INHALE 2 PUFFS INTO THE LUNGS EVERY 6 HOURS AS NEEDED FOR WHEEZING ORSHORTNESS OF BREATH.  Marland Kitchen tiZANidine (ZANAFLEX) 4 MG tablet Take 1 tablet (4 mg total) by mouth every 8 (eight) hours as needed for muscle spasms.   No current facility-administered medications for this visit. (Other)      REVIEW OF SYSTEMS:    ALLERGIES No Known Allergies  PAST MEDICAL HISTORY Past Medical History:  Diagnosis Date  . COPD (chronic obstructive pulmonary disease) (HCC)   . GERD (gastroesophageal reflux disease)   . History of hepatitis C    treated several years ago through [place]  . Hypertension    Past Surgical History:  Procedure Laterality Date  . CATARACT EXTRACTION, BILATERAL    . EYE SURGERY Bilateral   . ORTHOPEDIC SURGERY Bilateral    pt states fracture repairs to arms and  legs.  Marland Kitchen REVERSE SHOULDER ARTHROPLASTY Left 09/23/2019   Procedure: REVERSE SHOULDER ARTHROPLASTY;  Surgeon: Jones Broom, MD;  Location: [facility];  Service: Orthopedics;  Laterality: Left;    FAMILY HISTORY Family History  Problem Relation Age of Onset  . Heart disease Mother   . Hypertension Father     SOCIAL HISTORY Social History   Tobacco Use  . Smoking status: Former Smoker    Packs/day: 1.00    Years: 35.00    Pack years: 35.00    Types: Cigarettes    Quit date: 10/01/2010    Years since quitting: 9.9  . Smokeless tobacco: Never Used  Vaping Use  . Vaping Use: Never used  Substance Use Topics  . Alcohol use: Yes    Alcohol/week: 42.0 standard drinks    Types:  42 Cans of beer per week    Comment: daily - 6 drinks a day  . Drug use: Yes    Types: Marijuana    Comment: last marijuana 2-3 weeks ago         OPHTHALMIC EXAM:  Base Eye Exam    Visual Acuity (ETDRS)      Right Left   Dist [place] 20/40 20/200   Dist ph [place] 20/30 NI       Tonometry (Tonopen, 11:10 AM)      Right Left   Pressure 14 15       Pupils      Pupils Dark Light Shape React APD   Right PERRL 3 2 Round Brisk None   Left PERRL 3 2 Round Brisk None       Visual Fields (Counting fingers)      Left Right    Full Full       Extraocular Movement      Right Left    Full Full       Neuro/Psych    Oriented x3: Yes   Mood/Affect: Normal       Dilation    Right eye: 1.0% Mydriacyl, 2.5% Phenylephrine @ 11:14 AM        Slit Lamp and Fundus Exam    External Exam      Right Left   External Normal Normal       Slit Lamp Exam      Right Left   Lids/Lashes Normal Normal   Conjunctiva/Sclera White and quiet White and quiet   Cornea Clear Clear   Anterior Chamber Deep and quiet Deep and quiet   Iris Round and reactive Round and reactive   Lens Posterior chamber intraocular lens Posterior chamber intraocular lens   Anterior Vitreous Normal Normal       Fundus Exam      Right Left   Posterior Vitreous Vitrectomized    Disc Normal    C/D Ratio 0.3    Macula Retinal pigment epithelial mottling    Vessels Normal    Periphery Normal           IMAGING AND PROCEDURES  Imaging and Procedures for 09/24/20  OCT, Retina - OU - Both Eyes       Right Eye Quality was good. Scan locations included subfoveal. Central Foveal Thickness: 272. Progression has improved. Findings include abnormal foveal contour.   Left Eye Quality was good. Scan locations included subfoveal. Central Foveal Thickness: 351. Progression has improved. Findings include abnormal foveal contour.   Notes OD much less subretinal fluid in and superior the fovea now currently at 5-week  follow-up for repeat injection Eylea  OD today  OS also with much less subretinal fluid, at 6-week follow-up today   Each of these findings are coincident with with now excellent compliance with nightly oxygen "all night".  In previous months he would not replace it if it was displaced during the night  He has been using it nightly throughout the entire night for the past 5 weeks        Intravitreal Injection, Pharmacologic Agent - OD - Right Eye       Time Out 09/24/2020. 11:35 AM. Confirmed correct patient, procedure, site, and patient consented.   Anesthesia Topical anesthesia was used. Anesthetic medications included Akten 3.5%.   Procedure Preparation included 10% betadine to eyelids, 5% betadine to ocular surface, Ofloxacin . A 30 gauge needle was used.   Injection:  2 mg aflibercept Alfonse Flavors) SOLN   NDC: [id number], Lot: [id number]   Route: Intravitreal, Site: Right Eye, Waste: 0 mg  Post-op Post injection exam found visual acuity of at least counting fingers. The patient tolerated the procedure well. There were no complications. The patient received written and verbal post procedure care education. Post injection medications were not given.                 ASSESSMENT/PLAN:  Exudative age-related macular degeneration of right eye with active choroidal neovascularization (HCC) Improved at 5-week interval, repeat intravitreal Eylea OD today  Each of these findings are coincident with with now excellent compliance with nightly oxygen "all night".  In previous months he would not replace it if it was displaced during the night  He has been using it nightly throughout the entire night for the past 5 weeks  Exudative age-related macular degeneration of left eye with active choroidal neovascularization ([place]) Today at 6-week follow-up, now subretinal fluid in the left eye has improved although present just a month ago  Each of these findings are coincident with with  now excellent compliance with nightly oxygen "all night".  In previous months he would not replace it if it was displaced during the night  He has been using it nightly throughout the entire night for the past 5 weeks      ICD-10-CM   1. Exudative age-related macular degeneration of right eye with active choroidal neovascularization (HCC)  H35.3211 OCT, Retina - OU - Both Eyes    Intravitreal Injection, Pharmacologic Agent - OD - Right Eye    aflibercept (EYLEA) SOLN 2 mg  2. Exudative age-related macular degeneration of left eye with active choroidal neovascularization ([place])  H35.3221   3. Obstructive sleep apnea hypopnea, severe  G47.33     1.  Patient has had a rather miraculous improvement of the serous subretinal fluid in each eye over the last interval of 5 weeks.  OD had a region superior and into the FAZ which had not improved after recent injection, but now has improved coincident with our last discussion whereas I ask him to uses nightly oxygen all night should it be displaced during his sleep.  He has done so and now each eye has had a remarkable improvement in lessening of the subretinal fluid, serous retinal detachment component of CNVM.  This could also simply be less nightly stress hormone release as he has normal more normal oxygenation with his underlying COPD illness  2.  Has had witnessed (girlfriend) sleep apnea when falling asleep during an afternoon, is now motivated to proceed with full sleep apnea testing to determine whether CPAP would be more appropriate  3.  Repeat  injection intravitreal Eylea OD today for CNVM to maintain visual functioning and protect acuity   Or.  I do urge the patient have formal sleep testing whether at home or in laboratory and proceed with therapy as directed by the sleep study specialist Ophthalmic Meds Ordered this visit:  Meds ordered this encounter  Medications  . aflibercept (EYLEA) SOLN 2 mg       Return in about 5 weeks (around  10/29/2020) for DILATE OU, EYLEA OCT, OD.  There are no Patient Instructions on file for this visit.   Explained the diagnoses, plan, and follow up with the patient and they expressed understanding.  Patient expressed understanding of the importance of proper follow up care.   [name] M.D. Diseases & Surgery of the Retina and Vitreous [facility] 09/24/20     Abbreviations: M myopia (nearsighted); A astigmatism; H hyperopia (farsighted); P presbyopia; Mrx spectacle prescription;  CTL contact lenses; OD right eye; OS left eye; OU both eyes  XT exotropia; ET esotropia; PEK punctate epithelial keratitis; PEE punctate epithelial erosions; DES dry eye syndrome; MGD meibomian gland dysfunction; ATs artificial tears; PFAT's preservative free artificial tears; NSC nuclear sclerotic cataract; PSC posterior subcapsular cataract; ERM epi-retinal membrane; PVD posterior vitreous detachment; RD retinal detachment; DM diabetes mellitus; DR diabetic retinopathy; NPDR non-proliferative diabetic retinopathy; PDR proliferative diabetic retinopathy; CSME clinically significant macular edema; DME diabetic macular edema; dbh dot blot hemorrhages; CWS cotton wool spot; POAG primary open angle glaucoma; C/D cup-to-disc ratio; HVF humphrey visual field; GVF goldmann visual field; OCT optical coherence tomography; IOP intraocular pressure; BRVO Branch retinal vein occlusion; CRVO central retinal vein occlusion; CRAO central retinal artery occlusion; BRAO branch retinal artery occlusion; RT retinal tear; SB scleral buckle; PPV pars plana vitrectomy; VH Vitreous hemorrhage; PRP panretinal laser photocoagulation; IVK intravitreal kenalog; VMT vitreomacular traction; MH Macular hole;  NVD neovascularization of the disc; NVE neovascularization elsewhere; AREDS age related eye disease study; ARMD age related macular degeneration; POAG primary open angle glaucoma; EBMD epithelial/anterior basement membrane  dystrophy; ACIOL anterior chamber intraocular lens; IOL intraocular lens; PCIOL posterior chamber intraocular lens; Phaco/IOL phacoemulsification with intraocular lens placement; PRK photorefractive keratectomy; LASIK laser assisted in situ keratomileusis; HTN hypertension; DM diabetes mellitus; COPD chronic obstructive pulmonary disease

## 2020-09-24 NOTE — Assessment & Plan Note (Addendum)
Improved at 5-week interval, repeat intravitreal Eylea OD today  Each of these findings are coincident with with now excellent compliance with nightly oxygen "all night".  In previous months he would not replace it if it was displaced during the night  He has been using it nightly throughout the entire night for the past 5 weeks
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
## 2020-09-25 ENCOUNTER — Encounter: Payer: Self-pay | Admitting: Internal Medicine

## 2020-09-25 ENCOUNTER — Ambulatory Visit: Payer: 59 | Admitting: Internal Medicine

## 2020-09-25 VITALS — BP 130/90 | HR 84 | Temp 97.9°F | Ht 74.0 in | Wt 225.4 lb

## 2020-09-25 DIAGNOSIS — G4733 Obstructive sleep apnea (adult) (pediatric): Secondary | ICD-10-CM | POA: Diagnosis not present

## 2020-09-25 DIAGNOSIS — R0683 Snoring: Secondary | ICD-10-CM

## 2020-09-25 DIAGNOSIS — J9611 Chronic respiratory failure with hypoxia: Secondary | ICD-10-CM

## 2020-09-25 DIAGNOSIS — J449 Chronic obstructive pulmonary disease, unspecified: Secondary | ICD-10-CM | POA: Diagnosis not present

## 2020-09-25 MED ORDER — PREDNISONE 10 MG PO TABS: ORAL_TABLET | ORAL | 0 refills | Status: DC

## 2020-09-25 MED ORDER — AMOXICILLIN-POT CLAVULANATE 875-125 MG PO TABS: 1.0000 | ORAL_TABLET | Freq: Two times a day (BID) | ORAL | 0 refills | Status: DC

## 2020-09-25 NOTE — Patient Instructions (Signed)
Order- schedule home sleep test   Dx snoring  Please call us about 2 weeks after your sleep test for results and recommendations. If appropriate, we can get CPAP connected to your home O2, working with [facility].  Scripts sent for augmentin antibiotic and for a prednisone taper.

## 2020-10-20 ENCOUNTER — Ambulatory Visit (INDEPENDENT_AMBULATORY_CARE_PROVIDER_SITE_OTHER): Payer: 59 | Admitting: Internal Medicine

## 2020-10-20 ENCOUNTER — Ambulatory Visit (INDEPENDENT_AMBULATORY_CARE_PROVIDER_SITE_OTHER)
Admission: RE | Admit: 2020-10-20 | Discharge: 2020-10-20 | Disposition: A | Discharge status: Home / Self Care | Payer: PRIVATE HEALTH INSURANCE | Source: Ambulatory Visit | Attending: Internal Medicine | Admitting: Internal Medicine

## 2020-10-20 ENCOUNTER — Encounter: Payer: Self-pay | Admitting: Internal Medicine

## 2020-10-20 ENCOUNTER — Other Ambulatory Visit: Payer: Self-pay

## 2020-10-20 VITALS — BP 124/78 | HR 93 | Temp 97.8°F | Wt 233.0 lb

## 2020-10-20 DIAGNOSIS — K5900 Constipation, unspecified: Secondary | ICD-10-CM

## 2020-10-20 NOTE — Patient Instructions (Signed)
Constipation, Adult Constipation is when a person has trouble pooping (having a bowel movement). When you have this condition, you may poop fewer than 3 times a week. Your poop (stool) may also be dry, hard, or bigger than normal. Follow these instructions at home: Eating and drinking  Eat foods that have a lot of fiber, such as: ? Fresh fruits and vegetables. ? Whole grains. ? Beans.  Eat less of foods that are low in fiber and high in fat and sugar, such as: ? French fries. ? Hamburgers. ? Cookies. ? Candy. ? Soda.  Drink enough fluid to keep your pee (urine) pale yellow.   General instructions  Exercise regularly or as told by your doctor. Try to do 150 minutes of exercise each week.  Go to the restroom when you feel like you need to poop. Do not hold it in.  Take over-the-counter and prescription medicines only as told by your doctor. These include any fiber supplements.  When you poop: ? Do deep breathing while relaxing your lower belly (abdomen). ? Relax your pelvic floor. The pelvic floor is a group of muscles that support the rectum, bladder, and intestines (as well as the uterus in women).  Watch your condition for any changes. Tell your doctor if you notice any.  Keep all follow-up visits as told by your doctor. This is important. Contact a doctor if:  You have pain that gets worse.  You have a fever.  You have not pooped for 4 days.  You vomit.  You are not hungry.  You lose weight.  You are bleeding from the opening of the butt (anus).  You have thin, pencil-like poop. Get help right away if:  You have a fever, and your symptoms suddenly get worse.  You leak poop or have blood in your poop.  Your belly feels hard or bigger than normal (bloated).  You have very bad belly pain.  You feel dizzy or you faint. Summary  Constipation is when a person poops fewer than 3 times a week, has trouble pooping, or has poop that is dry, hard, or bigger than  normal.  Eat foods that have a lot of fiber.  Drink enough fluid to keep your pee (urine) pale yellow.  Take over-the-counter and prescription medicines only as told by your doctor. These include any fiber supplements. This information is not intended to replace advice given to you by your health care provider. Make sure you discuss any questions you have with your health care provider. Document Revised: 07/24/2019 Document Reviewed: 07/24/2019 Elsevier Patient Education  2021 [employer]  

## 2020-10-20 NOTE — Progress Notes (Signed)
Subjective:    Patient ID: [name], male    DOB: 1955/12/20, 65 y.o.   MRN: [id number]  HPI  Pt presents to the clinic today with c/o constipation. This started 2 weeks ago. He is used to having 2 BM's daily. He reports his stools are small, hard. He reports some associated bloating. He denies nausea, vomiting, reflux, diarrhea or blood in his stool. He denies recent changes in diet or medications. He has tried Dulcolax, Mag Citrate and Mirilax as needed with some relief. He does not know when the last colonoscopy he had was, but reports it was done by Dr. Kinnie Scales. He denies history of diverticulosis.  Review of Systems  Past Medical History:  Diagnosis Date  . COPD (chronic obstructive pulmonary disease) (HCC)   . GERD (gastroesophageal reflux disease)   . History of hepatitis C    treated several years ago through [place]  . Hypertension     Current Outpatient Medications  Medication Sig Dispense Refill  . acetaminophen (TYLENOL) 500 MG tablet Take 500-1,000 mg by mouth every 6 (six) hours as needed (for pain.).    Marland Kitchen Aflibercept 2 MG/0.05ML SOLN Inject 1 Dose into the eye every 8 (eight) weeks.     Marland Kitchen amoxicillin-clavulanate (AUGMENTIN) 875-125 MG tablet Take 1 tablet by mouth 2 (two) times daily. 14 tablet 0  . aspirin EC 81 MG tablet Take 81 mg by mouth daily.    . diclofenac (VOLTAREN) 75 MG EC tablet Take 1 tablet (75 mg total) by mouth 2 (two) times daily. 50 tablet 2  . Fluticasone-Umeclidin-Vilant (TRELEGY ELLIPTA) 100-62.5-25 MCG/INH AEPB Inhale 1 puff into the lungs daily. Rinse mouth 60 each 12  . Fluticasone-Umeclidin-Vilant (TRELEGY ELLIPTA) 200-62.5-25 MCG/INH AEPB Inhale 1 puff into the lungs daily. 2 each 0  . gabapentin (NEURONTIN) 300 MG capsule Take 1 capsule (300 mg total) by mouth 3 (three) times daily. 90 capsule 3  . ipratropium-albuterol (DUONEB) 0.5-2.5 (3) MG/3ML SOLN INHALE THE CONTENTS OF 1 VIAL (3 ML) VIANEBULIZER EVERY 6 HOURS AS NEEDED 180 mL 12  .  lisinopril (ZESTRIL) 20 MG tablet TAKE 1 TABLET BY MOUTH ONCE A DAY 90 tablet 3  . Multiple Vitamin (MULTIVITAMIN WITH MINERALS) TABS tablet Take 1 tablet by mouth daily.    . pantoprazole (PROTONIX) 20 MG tablet Take 1 tablet (20 mg total) by mouth daily. 90 tablet 1  . pravastatin (PRAVACHOL) 40 MG tablet Take 1 tablet (40 mg total) by mouth daily. 90 tablet 3  . predniSONE (DELTASONE) 10 MG tablet 4 X 2 DAYS, 3 X 2 DAYS, 2 X 2 DAYS, 1 X 2 DAYS 20 tablet 0  . PROAIR HFA 108 (90 Base) MCG/ACT inhaler INHALE 2 PUFFS INTO THE LUNGS EVERY 6 HOURS AS NEEDED FOR WHEEZING ORSHORTNESS OF BREATH. 18 g 12   No current facility-administered medications for this visit.    No Known Allergies  Family History  Problem Relation Age of Onset  . Heart disease Mother   . Hypertension Father     Social History   Socioeconomic History  . Marital status: Single    Spouse name: Not on file  . Number of children: 2  . Years of education: Not on file  . Highest education level: Not on file  Occupational History  . Occupation: Holiday representative  Tobacco Use  . Smoking status: Former Smoker    Packs/day: 1.00    Years: 35.00    Pack years: 35.00    Types: Cigarettes  Quit date: 10/01/2010    Years since quitting: 10.0  . Smokeless tobacco: Never Used  Vaping Use  . Vaping Use: Never used  Substance and Sexual Activity  . Alcohol use: Yes    Alcohol/week: 42.0 standard drinks    Types: 42 Cans of beer per week    Comment: daily - 6 drinks a day  . Drug use: Yes    Types: Marijuana    Comment: last marijuana 2-3 weeks ago  . Sexual activity: Not Currently  Other Topics Concern  . Not on file  Social History Narrative  . Not on file   Social Determinants of Health   Financial Resource Strain: Not on file  Food Insecurity: Not on file  Transportation Needs: Not on file  Physical Activity: Not on file  Stress: Not on file  Social Connections: Not on file  Intimate Partner Violence: Not on  file     Constitutional: Denies fever, malaise, fatigue, headache or abrupt weight changes.  Respiratory: Denies difficulty breathing, shortness of breath, cough or sputum production.   Cardiovascular: Denies chest pain, chest tightness, palpitations or swelling in the hands or feet.  Gastrointestinal: Pt reports constipation. Denies abdominal pain, bloating, diarrhea or blood in the stool.    No other specific complaints in a complete review of systems (except as listed in HPI above).     Objective:   Physical Exam   BP 124/78   Pulse 93   Temp 97.8 F (36.6 C) (Temporal)   Wt 233 lb (105.7 kg)   SpO2 98%   BMI 29.92 kg/m   Wt Readings from Last 3 Encounters:  09/25/20 225 lb 6.4 oz (102.2 kg)  08/31/20 230 lb 1.9 oz (104.4 kg)  04/14/20 (!) 218 lb 9.6 oz (99.2 kg)    General: Appears his stated age, obese, in NAD. Cardiovascular: Normal rate and rhythm.  Pulmonary/Chest: Normal effort and positive vesicular breath sounds. No respiratory distress. No wheezes, rales or ronchi noted.  Abdomen: Soft and nontender. Hypoactive bowel sounds. No distention or masses noted.  Neurological: Alert and oriented.   BMET    Component Value Date/Time   NA 140 08/31/2020 1013   K 4.5 08/31/2020 1013   CL 105 08/31/2020 1013   CO2 29 08/31/2020 1013   GLUCOSE 90 08/31/2020 1013   BUN 10 08/31/2020 1013   CREATININE 0.86 08/31/2020 1013   CREATININE 0.99 08/09/2019 1551   CALCIUM 9.2 08/31/2020 1013   GFRNONAA >60 09/24/2019 0230   GFRAA >60 09/24/2019 0230    Lipid Panel     Component Value Date/Time   CHOL 180 08/31/2020 1013   TRIG 88.0 08/31/2020 1013   HDL 58.60 08/31/2020 1013   CHOLHDL 3 08/31/2020 1013   VLDL 17.6 08/31/2020 1013   LDLCALC 104 (H) 08/31/2020 1013   LDLCALC 102 (H) 08/09/2019 1551    CBC    Component Value Date/Time   WBC 5.8 02/28/2020 1025   RBC 3.91 (L) 02/28/2020 1025   HGB 13.7 02/28/2020 1025   HCT 39.5 02/28/2020 1025   PLT 167.0  02/28/2020 1025   MCV 101.1 (H) 02/28/2020 1025   MCH 33.8 09/24/2019 0230   MCHC 34.7 02/28/2020 1025   RDW 12.8 02/28/2020 1025   LYMPHSABS 1.1 02/28/2020 1025   MONOABS 0.7 02/28/2020 1025   EOSABS 0.1 02/28/2020 1025   BASOSABS 0.0 02/28/2020 1025    Hgb A1C Lab Results  Component Value Date   HGBA1C 5.4 08/31/2020  Assessment & Plan:   Constipation:  Xray abdomen today to assess stool burden He will continue Mirilax 2 x day for now Encouraged adequate water and fiber intake  Return precautions discussed Nicki Reaper, NP This visit occurred during the SARS-CoV-2 public health emergency.  Safety protocols were in place, including screening questions prior to the visit, additional usage of staff PPE, and extensive cleaning of exam room while observing appropriate contact time as indicated for disinfecting solutions.

## 2020-10-23 ENCOUNTER — Other Ambulatory Visit: Payer: Self-pay

## 2020-10-23 ENCOUNTER — Ambulatory Visit: Payer: 59

## 2020-10-23 DIAGNOSIS — G4733 Obstructive sleep apnea (adult) (pediatric): Secondary | ICD-10-CM | POA: Diagnosis not present

## 2020-10-23 DIAGNOSIS — R0683 Snoring: Secondary | ICD-10-CM

## 2020-10-27 ENCOUNTER — Telehealth: Payer: Self-pay

## 2020-10-27 DIAGNOSIS — G4733 Obstructive sleep apnea (adult) (pediatric): Secondary | ICD-10-CM | POA: Diagnosis not present

## 2020-10-27 NOTE — Telephone Encounter (Signed)
Try fleets enema. If no results in 2 hours repeat. If no results recommend ER eval.

## 2020-10-27 NOTE — Telephone Encounter (Signed)
Pt was seen on 10/20/20; pt last BM was on 10/26/20 that was very constipated and hard and very small amt. and pt is taking miralax tid for 1 month. Since seen on 10/20/20 no normal BM and pt asked Aggie Cosier to call doctor and he never ask her to call doctor. Pt had to go up 2 sizes in pants due to bloating for over a month. Aggie Cosier said pt looks like he is ready to deliver as being 9 mths pregnant. Pt is not gaining wt but extremely swollen; Aggie Cosier said pt is passing gas. Pt is usually regular having BM's prior to the past month. No loose stools, no N &V, pt does not complain about abd pain unless trying to have BM without results and then pt has lower abd pain; Pt had abd xray on 10/20/20. Pt has not mentioned seeing any blood. No fever. Aggie Cosier said she is worried and thinks the pt is also worried because usually pt does not complain about anything. Pt is presently at work and is not in any distress at this time. Sending note to Pamala Hurry NP for further instructions with above info.

## 2020-10-28 ENCOUNTER — Emergency Department (HOSPITAL_COMMUNITY)
Admission: EM | Admit: 2020-10-28 | Discharge: 2020-10-28 | Disposition: A | Discharge status: Home / Self Care | Payer: 59 | Attending: Emergency Medicine | Admitting: Emergency Medicine

## 2020-10-28 ENCOUNTER — Other Ambulatory Visit: Payer: Self-pay

## 2020-10-28 ENCOUNTER — Encounter (HOSPITAL_COMMUNITY): Payer: Self-pay | Admitting: Emergency Medicine

## 2020-10-28 ENCOUNTER — Emergency Department (HOSPITAL_COMMUNITY): Payer: 59

## 2020-10-28 DIAGNOSIS — I2510 Atherosclerotic heart disease of native coronary artery without angina pectoris: Secondary | ICD-10-CM | POA: Diagnosis not present

## 2020-10-28 DIAGNOSIS — R109 Unspecified abdominal pain: Secondary | ICD-10-CM | POA: Diagnosis present

## 2020-10-28 DIAGNOSIS — I10 Essential (primary) hypertension: Secondary | ICD-10-CM | POA: Insufficient documentation

## 2020-10-28 DIAGNOSIS — Z96612 Presence of left artificial shoulder joint: Secondary | ICD-10-CM | POA: Insufficient documentation

## 2020-10-28 DIAGNOSIS — Z7982 Long term (current) use of aspirin: Secondary | ICD-10-CM | POA: Insufficient documentation

## 2020-10-28 DIAGNOSIS — J449 Chronic obstructive pulmonary disease, unspecified: Secondary | ICD-10-CM | POA: Insufficient documentation

## 2020-10-28 DIAGNOSIS — K219 Gastro-esophageal reflux disease without esophagitis: Secondary | ICD-10-CM | POA: Insufficient documentation

## 2020-10-28 DIAGNOSIS — Z7951 Long term (current) use of inhaled steroids: Secondary | ICD-10-CM | POA: Diagnosis not present

## 2020-10-28 DIAGNOSIS — Z79899 Other long term (current) drug therapy: Secondary | ICD-10-CM | POA: Diagnosis not present

## 2020-10-28 DIAGNOSIS — Z87891 Personal history of nicotine dependence: Secondary | ICD-10-CM | POA: Diagnosis not present

## 2020-10-28 DIAGNOSIS — K5900 Constipation, unspecified: Secondary | ICD-10-CM | POA: Insufficient documentation

## 2020-10-28 LAB — LIPASE, BLOOD: Lipase: 25 U/L (ref 11–51)

## 2020-10-28 LAB — COMPREHENSIVE METABOLIC PANEL
ALT: 60 U/L — ABNORMAL HIGH (ref 0–44)
AST: 49 U/L — ABNORMAL HIGH (ref 15–41)
Albumin: 4.1 g/dL (ref 3.5–5.0)
Alkaline Phosphatase: 60 U/L (ref 38–126)
Anion gap: 10 (ref 5–15)
BUN: 16 mg/dL (ref 8–23)
CO2: 22 mmol/L (ref 22–32)
Calcium: 9.1 mg/dL (ref 8.9–10.3)
Chloride: 104 mmol/L (ref 98–111)
Creatinine, Ser: 0.9 mg/dL (ref 0.61–1.24)
GFR, Estimated: >60 mL/min (ref >60)
Glucose, Bld: 93 mg/dL (ref 70–99)
Potassium: 3.8 mmol/L (ref 3.5–5.1)
Sodium: 136 mmol/L (ref 135–145)
Total Bilirubin: 0.6 mg/dL (ref 0.3–1.2)
Total Protein: 7.4 g/dL (ref 6.5–8.1)

## 2020-10-28 LAB — CBC
HCT: 41.2 % (ref 39.0–52.0)
Hemoglobin: 13.8 g/dL (ref 13.0–17.0)
MCH: 33.6 pg (ref 26.0–34.0)
MCHC: 33.5 g/dL (ref 30.0–36.0)
MCV: 100.2 fL — ABNORMAL HIGH (ref 80.0–100.0)
Platelets: 162 10*3/uL (ref 150–400)
RBC: 4.11 MIL/uL — ABNORMAL LOW (ref 4.22–5.81)
RDW: 11.7 % (ref 11.5–15.5)
WBC: 6.3 10*3/uL (ref 4.0–10.5)
nRBC: 0 % (ref 0.0–0.2)

## 2020-10-28 LAB — URINALYSIS, ROUTINE W REFLEX MICROSCOPIC
Bilirubin Urine: NEGATIVE
Glucose, UA: NEGATIVE mg/dL
Hgb urine dipstick: NEGATIVE
Ketones, ur: NEGATIVE mg/dL
Leukocytes,Ua: NEGATIVE
Nitrite: NEGATIVE
Protein, ur: NEGATIVE mg/dL
Specific Gravity, Urine: 1.01 (ref 1.005–1.030)
pH: 6 (ref 5.0–8.0)

## 2020-10-28 MED ORDER — IOHEXOL 300 MG/ML  SOLN
100.0000 mL | Freq: Once | INTRAMUSCULAR | Status: AC | PRN
  Administered 2020-10-28: 100 mL via INTRAVENOUS

## 2020-10-28 MED ORDER — SENNOSIDES-DOCUSATE SODIUM 8.6-50 MG PO TABS: 1.0000 | ORAL_TABLET | Freq: Every day | ORAL | 0 refills | Status: DC

## 2020-10-28 NOTE — Discharge Instructions (Signed)
For tomorrow, I would recommend aggressive regimen of MiraLAX; take 1 serving every 2 hours until you have a bowel movement up to 5 servings.  Then continue 2 servings daily.  Would additionally recommend taking the prescribed Peri-Colace daily.  Recommend follow-up with your primary doctor as well as your gastroenterologist.

## 2020-10-28 NOTE — Telephone Encounter (Signed)
Patient's girlfriend Aggie Cosier (on [place]) called stating that they called yesterday and has not heard anything back. Aggie Cosier was advised of Cecile Sheerer NP recommendations. Aggie Cosier stated that patient is not going to the ER and sit for hours when he has a provider here at the office. Advised patient's girlfriend again of [facility] advice and if this does not help and he refuses to go to the ER to call the office back.

## 2020-10-28 NOTE — ED Provider Notes (Signed)
[facility] EMERGENCY DEPARTMENT Provider Note   CSN: [id number] Arrival date & time: 10/28/20  1706     History Chief Complaint  Patient presents with  . Abdominal Pain    [name] is a 65 y.o. male.  Presents to ER with concern for abdominal pain.  Patient reports that he has been having constipation for the past 3 weeks.  Has been trying MiraLAX and had tried some Dulcolax with minimal relief.  Has occasional small stool.  Still passing gas.  No nausea or vomiting.  Over the last couple days he has had increased in pain.  Describes pain as dull ache, in general, mild.  Has not taken anything for his pain.  HPI     Past Medical History:  Diagnosis Date  . COPD (chronic obstructive pulmonary disease) (HCC)   . GERD (gastroesophageal reflux disease)   . History of hepatitis C    treated several years ago through [place]  . Hypertension     Patient Active Problem List   Diagnosis Date Noted  . Obstructive sleep apnea hypopnea, severe 09/24/2020  . Exudative age-related macular degeneration of left eye with active choroidal neovascularization (HCC) 01/28/2020  . 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  . Exudative age-related macular degeneration of right eye with active choroidal neovascularization (HCC) 01/28/2020  . CAD (coronary artery disease) 10/16/2019  . S/P reverse total shoulder arthroplasty, left 09/23/2019  . Overweight (BMI 25.0-29.9) 08/13/2019  . Hyperlipidemia 08/13/2019  . Essential hypertension 08/13/2019  . Gastroesophageal reflux disease 08/13/2019  . 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 Lung nodules 10/28/2015  . 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 Chest x-ray abnormality 10/01/2014    Past Surgical History:  Procedure Laterality Date  . CATARACT EXTRACTION, BILATERAL    . EYE SURGERY Bilateral   . ORTHOPEDIC SURGERY Bilateral    pt states fracture repairs to arms and legs.  Marland Kitchen REVERSE SHOULDER  ARTHROPLASTY Left 09/23/2019   Procedure: REVERSE SHOULDER ARTHROPLASTY;  Surgeon: Jones Broom, MD;  Location: [facility];  Service: Orthopedics;  Laterality: Left;       Family History  Problem Relation Age of Onset  . Heart disease Mother   . Hypertension Father     Social History   Tobacco Use  . Smoking status: Former Smoker    Packs/day: 1.00    Years: 35.00    Pack years: 35.00    Types: Cigarettes    Quit date: 10/01/2010    Years since quitting: 10.0  . Smokeless tobacco: Never Used  Vaping Use  . Vaping Use: Never used  Substance Use Topics  . Alcohol use: Yes    Alcohol/week: 42.0 standard drinks    Types: 42 Cans of beer per week    Comment: daily - 6 drinks a day  . Drug use: Yes    Types: Marijuana    Comment: last marijuana 2-3 weeks ago    Home Medications Prior to Admission medications   Medication Sig Start Date End Date Taking? Authorizing Provider  senna-docusate (SENOKOT-S) 8.6-50 MG tablet Take 1 tablet by mouth daily. 10/28/20  Yes Milagros Loll, MD  acetaminophen (TYLENOL) 500 MG tablet Take 500-1,000 mg by mouth every 6 (six) hours as needed (for pain.).    [provider]  Aflibercept 2 MG/0.05ML SOLN Inject 1 Dose into the eye every 8 (eight) weeks.     [provider]  aspirin EC 81 MG tablet Take 81 mg  by mouth daily.    [provider]  diclofenac (VOLTAREN) 75 MG EC tablet Take 1 tablet (75 mg total) by mouth 2 (two) times daily. 08/26/20   Lenn Sink, DPM  Fluticasone-Umeclidin-Vilant (TRELEGY ELLIPTA) 100-62.5-25 MCG/INH AEPB Inhale 1 puff into the lungs daily. Rinse mouth 10/16/19   Jetty Duhamel D, MD  Fluticasone-Umeclidin-Vilant (TRELEGY ELLIPTA) 200-62.5-25 MCG/INH AEPB Inhale 1 puff into the lungs daily. 04/14/20   Waymon Budge, MD  gabapentin (NEURONTIN) 300 MG capsule Take 1 capsule (300 mg total) by mouth 3 (three) times daily. 07/29/20   Lenn Sink, DPM  ipratropium-albuterol (DUONEB)  0.5-2.5 (3) MG/3ML SOLN INHALE THE CONTENTS OF 1 VIAL (3 ML) VIANEBULIZER EVERY 6 HOURS AS NEEDED 10/16/19   Jetty Duhamel D, MD  lisinopril (ZESTRIL) 20 MG tablet TAKE 1 TABLET BY MOUTH ONCE A DAY 12/31/19   Emi Belfast, FNP  Multiple Vitamin (MULTIVITAMIN WITH MINERALS) TABS tablet Take 1 tablet by mouth daily.    [provider]  pantoprazole (PROTONIX) 20 MG tablet Take 1 tablet (20 mg total) by mouth daily. 02/28/20   Emi Belfast, FNP  pravastatin (PRAVACHOL) 40 MG tablet Take 1 tablet (40 mg total) by mouth daily. 09/01/20   Emi Belfast, FNP  predniSONE (DELTASONE) 10 MG tablet 4 X 2 DAYS, 3 X 2 DAYS, 2 X 2 DAYS, 1 X 2 DAYS 09/25/20   Waymon Budge, MD  PROAIR HFA 108 [phone] Base) MCG/ACT inhaler INHALE 2 PUFFS INTO THE LUNGS EVERY 6 HOURS AS NEEDED FOR WHEEZING ORSHORTNESS OF BREATH. 10/16/19   Waymon Budge, MD    Allergies    Patient has no known allergies.  Review of Systems   Review of Systems  Constitutional: Negative for chills and fever.  HENT: Negative for ear pain and sore throat.   Eyes: Negative for pain and visual disturbance.  Respiratory: Negative for cough and shortness of breath.   Cardiovascular: Negative for chest pain and palpitations.  Gastrointestinal: Positive for abdominal pain and constipation. Negative for vomiting.  Genitourinary: Negative for dysuria and hematuria.  Musculoskeletal: Negative for arthralgias and back pain.  Skin: Negative for color change and rash.  Neurological: Negative for seizures and syncope.  All other systems reviewed and are negative.   Physical Exam Updated Vital Signs BP (!) 149/105 (BP Location: Left Arm)   Pulse 89   Temp 98 F (36.7 C) (Oral)   Resp 20   SpO2 93%   Physical Exam Vitals and nursing note reviewed.  Constitutional:      Appearance: He is well-developed and well-nourished.  HENT:     Head: Normocephalic and atraumatic.  Eyes:     Conjunctiva/sclera: Conjunctivae normal.   Cardiovascular:     Rate and Rhythm: Normal rate and regular rhythm.     Heart sounds: No murmur heard.   Pulmonary:     Effort: Pulmonary effort is normal. No respiratory distress.     Breath sounds: Normal breath sounds.  Abdominal:     Palpations: Abdomen is soft.     Comments: Mild TTP in LLQ, RLQ, no rebound or guarding  Musculoskeletal:        General: No edema.     Cervical back: Neck supple.  Skin:    General: Skin is warm and dry.  Neurological:     General: No focal deficit present.     Mental Status: He is alert.  Psychiatric:        Mood and  Affect: Mood and affect normal.     ED Results / Procedures / Treatments   Labs (all labs ordered are listed, but only abnormal results are displayed) Labs Reviewed  COMPREHENSIVE METABOLIC PANEL - Abnormal; Notable for the following components:      Result Value   AST 49 (*)    ALT 60 (*)    All other components within normal limits  CBC - Abnormal; Notable for the following components:   RBC 4.11 (*)    MCV 100.2 (*)    All other components within normal limits  LIPASE, BLOOD  URINALYSIS, ROUTINE W REFLEX MICROSCOPIC    EKG None  Radiology CT ABDOMEN PELVIS W CONTRAST  Result Date: 10/28/2020 CLINICAL DATA:  Left lower quadrant pain generalized abdominal pain, constipation or obstruction. EXAM: CT ABDOMEN AND PELVIS WITH CONTRAST TECHNIQUE: Multidetector CT imaging of the abdomen and pelvis was performed using the standard protocol following bolus administration of intravenous contrast. CONTRAST:  OMNIPAQUE IOHEXOL 300 MG/ML  SOLN COMPARISON:  Radiograph 10/20/2020 FINDINGS: Lower chest: Lung bases are clear. Normal heart size. No pericardial effusion. Hepatobiliary: Diffuse hepatic hypoattenuation compatible with hepatic steatosis. There is a geographic region of spared parenchymal attenuation in the anterior right lobe with a small serpiginous blushes contrast immediately adjacent. No other concerning or focal  liver lesions. Normal gallbladder. Normal biliary tree. Pancreas: Partial fatty replacement of the pancreas. No pancreatic ductal dilatation or surrounding inflammatory changes. Spleen: Normal in size. No concerning splenic lesions. Adrenals/Urinary Tract: Normal adrenal glands. Kidneys are normally located with symmetric enhancement and excretion. Mild symmetric bilateral perinephric stranding, a nonspecific finding which may correlate with advanced age or decreased renal function. Regions of cortical scarring bilaterally. Fluid attenuation cyst in the upper pole right kidney measures up to 3.4 cm without concerning features. No suspicious renal lesion, urolithiasis or hydronephrosis. Urinary bladder is unremarkable for the degree of distention. Stomach/Bowel: Distal esophagus, stomach and duodenal sweep are unremarkable. No small bowel wall thickening or dilatation. No evidence of obstruction. A normal appendix is visualized. No colonic dilatation or wall thickening. Scattered colonic diverticula without focal inflammation to suggest diverticulitis. Vascular/Lymphatic: Atherosclerotic calcifications within the abdominal aorta and branch vessels. No aneurysm or ectasia. No enlarged abdominopelvic lymph nodes. Reproductive: The prostate and seminal vesicles are unremarkable. Other: No abdominopelvic free fluid or free gas. No bowel containing hernias. Small fat containing inguinal hernias. Musculoskeletal: The osseous structures appear diffusely demineralized which may limit detection of small or nondisplaced fractures. No acute osseous abnormality or suspicious osseous lesion. Multilevel degenerative changes are present in the imaged portions of the spine. Prior right femoral intramedullary nail and side plate and screw fixation construct without acute complication. Bones of the pelvis and proximal femora are intact and aligned with mild degenerative changes in the bilateral hips. IMPRESSION: 1. No acute  intra-abdominal process. Specifically, no evidence of bowel obstruction. 2. Hepatic steatosis. Geographic region of spared parenchymal attenuation in the anterior right lobe with a small serpiginous blushes contrast immediately adjacent, possibly reflecting a transient hepatic attenuation difference associated with intrahepatic shunt versus focal fatty sparing. 3. Mild symmetric bilateral perinephric stranding, a nonspecific finding which may correlate with advanced age or decreased renal function. Recommend correlation with patient's symptoms and urinalysis as clinically warranted. 4. Colonic diverticulosis without evidence of diverticulitis. 5. Prior right femoral intramedullary nail and side plate and screw fixation construct without acute complication. 6. Aortic Atherosclerosis (ICD10-I70.0). Electronically Signed   By: Kreg Shropshire M.D.   On: 10/28/2020 23:01  Procedures Procedures   Medications Ordered in ED Medications  iohexol (OMNIPAQUE) 300 MG/ML solution 100 mL (100 mLs Intravenous Contrast Given 10/28/20 2241)    ED Course  I have reviewed the triage vital signs and the nursing notes.  Pertinent labs & imaging results that were available during my care of the patient were reviewed by me and considered in my medical decision making (see chart for details).    MDM Rules/Calculators/A&P                         65 year old male presents to ER with concern for abdominal pain constipation.  On exam well-appearing in no acute distress.  Noted some tenderness on exam.  Lab work is all grossly within normal limits.  CT scan demonstrating no acute findings.  Recommended bowel regimen, follow-up with primary doctor and GI.   After the discussed management above, the patient was determined to be safe for discharge.  The patient was in agreement with this plan and all questions regarding their care were answered.  ED return precautions were discussed and the patient will return to the ED with  any significant worsening of condition.  Final Clinical Impression(s) / ED Diagnoses Final diagnoses:  Constipation, unspecified constipation type    Rx / DC Orders ED Discharge Orders         Ordered    senna-docusate (SENOKOT-S) 8.6-50 MG tablet  Daily        10/28/20 2327           Milagros Loll, MD 10/28/20 2357

## 2020-10-28 NOTE — Telephone Encounter (Signed)
Spoke to [place] ok per DPR and she expressed understanding

## 2020-10-28 NOTE — Telephone Encounter (Signed)
Needs ER eval if no results from enema

## 2020-10-28 NOTE — Telephone Encounter (Signed)
Spoke with patient's girlfriend who called back requesting a Production designer, theatre/television/film.  She is concerned about taking him to the ER with an extensive wait and COVID exposure and is also unhappy that she had to call back today to get a response as she didn't get a call back yesterday.  I apologized that she never received a call back yesterday and continued to discuss patients situation.  I explained to her that given that the patient has had no response to the OTC med treatment and that he continues to have stool seepage only without any formed stool and excessive bloating,  it is suspicious for impaction.  I explained to her further what that meant and that trying the enemas could help with that which was why it was suggested.  But, if patient does not produce a BM, then he definitely needs a more thorough evaluation.  The impaction could be too high or too severe or there could be other issues going on which need CT scanning.   We discussed getting a scan outpatient but I told her this could take several days to get set up and even if it was ordered stat, and a problem identified, he would likely only need to go to the hospital anyway.   She still maintains that he has no pain and is eating regular full size meals without any nausea or vomiting.     Aggie Cosier thanks me for speaking with her today and explaining things further.  She states that after speaking with me she does understand that he needs to do the enemas and then go to ER if no results and why.   Marcell Barlow does want me to ask Rene Kocher if there is anything that can be done outpatient to keep him out of the ER but after speaking with me, she thinks it best he just goes to the hospital if no results.  I did tell her I would pass along to [facility] and if anything else could be done I would let her know.

## 2020-10-28 NOTE — ED Triage Notes (Signed)
Patient states he has not had a full bowel movement in several weeks. Patient reports feeling bloated, rates pain 5/10. Was given laxative and enema without relief.

## 2020-10-29 ENCOUNTER — Telehealth: Payer: Self-pay

## 2020-10-29 MED ORDER — PANTOPRAZOLE SODIUM 20 MG PO TBEC: 20.0000 mg | DELAYED_RELEASE_TABLET | Freq: Every day | ORAL | 1 refills | Status: DC

## 2020-10-29 NOTE — Telephone Encounter (Signed)
Pharmacy requests refill on: Pantoprazole 20 mg   LAST REFILL: 02/28/2020 (Q-90, R-1) LAST OV: 10/20/2020 NEXT OV: 03/01/2021 PHARMACY: [facility]

## 2020-11-02 ENCOUNTER — Ambulatory Visit (INDEPENDENT_AMBULATORY_CARE_PROVIDER_SITE_OTHER): Payer: 59 | Admitting: Ophthalmology

## 2020-11-02 ENCOUNTER — Encounter (INDEPENDENT_AMBULATORY_CARE_PROVIDER_SITE_OTHER): Payer: Self-pay | Admitting: Ophthalmology

## 2020-11-02 ENCOUNTER — Other Ambulatory Visit: Payer: Self-pay

## 2020-11-02 DIAGNOSIS — H353221 Exudative age-related macular degeneration, left eye, with active choroidal neovascularization: Secondary | ICD-10-CM

## 2020-11-02 DIAGNOSIS — G4733 Obstructive sleep apnea (adult) (pediatric): Secondary | ICD-10-CM

## 2020-11-02 DIAGNOSIS — H353211 Exudative age-related macular degeneration, right eye, with active choroidal neovascularization: Secondary | ICD-10-CM | POA: Diagnosis not present

## 2020-11-02 MED ORDER — AFLIBERCEPT 2MG/0.05ML IZ SOLN FOR KALEIDOSCOPE
2.0000 mg | INTRAVITREAL | Status: AC | PRN
  Administered 2020-11-02: 2 mg via INTRAVITREAL

## 2020-11-02 NOTE — Assessment & Plan Note (Signed)
Today at 12 weeks post injection left eye, will need injection left eye in the near future

## 2020-11-02 NOTE — Assessment & Plan Note (Signed)
Patient had recent home study sleep study under direction of Dr. Maple Hudson, results pending

## 2020-11-02 NOTE — Assessment & Plan Note (Signed)
Much improved OD today at current follow-up examination of 5 weeks.  We will repeat injection today and examination right eye again in 5 weeks

## 2020-11-02 NOTE — Progress Notes (Signed)
11/02/2020     CHIEF COMPLAINT Patient presents for Retina Follow Up (5 Wk FU OU, POSS EYLEA OD///Pt reports stable vision OD. Pt denies any new F/F, pain, or pressure OD.)   HISTORY OF PRESENT ILLNESS: [name] is a 65 y.o. 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  This started 5 weeks ago.  Severity is mild.  Duration of 5 weeks.  Since onset it is stable. Additional comments: 5 Wk FU OU, POSS EYLEA OD   Pt reports stable vision OD. Pt denies any new F/F, pain, or pressure OD.          Comments    Patient has been extremely compliant with using nighttime oxygen supplementation.       Last edited by Edmon Crapeankin, Deirdre Gryder A, MD on 11/02/2020 11:16 AM. (History)      Referring physician: No referring provider defined for this encounter.  HISTORICAL INFORMATION:   Selected notes from the MEDICAL RECORD NUMBER    Lab Results  Component Value Date   HGBA1C 5.4 08/31/2020     CURRENT MEDICATIONS: Current Outpatient Medications (Ophthalmic Drugs)  Medication Sig  . Aflibercept 2 MG/0.05ML SOLN Inject 1 Dose into the eye every 8 (eight) weeks.    No current facility-administered medications for this visit. (Ophthalmic Drugs)   Current Outpatient Medications (Other)  Medication Sig  . acetaminophen (TYLENOL) 500 MG tablet Take 500-1,000 mg by mouth every 6 (six) hours as needed (for pain.).  Marland Kitchen. aspirin EC 81 MG tablet Take 81 mg by mouth daily.  . diclofenac (VOLTAREN) 75 MG EC tablet Take 1 tablet (75 mg total) by mouth 2 (two) times daily.  . Fluticasone-Umeclidin-Vilant (TRELEGY ELLIPTA) 100-62.5-25 MCG/INH AEPB Inhale 1 puff into the lungs daily. Rinse mouth  . Fluticasone-Umeclidin-Vilant (TRELEGY ELLIPTA) 200-62.5-25 MCG/INH AEPB Inhale 1 puff into the lungs daily.  Marland Kitchen. gabapentin (NEURONTIN) 300 MG capsule Take 1 capsule (300 mg total) by mouth 3 (three) times daily.  Marland Kitchen.  ipratropium-albuterol (DUONEB) 0.5-2.5 (3) MG/3ML SOLN INHALE THE CONTENTS OF 1 VIAL (3 ML) VIANEBULIZER EVERY 6 HOURS AS NEEDED  . lisinopril (ZESTRIL) 20 MG tablet TAKE 1 TABLET BY MOUTH ONCE A DAY  . Multiple Vitamin (MULTIVITAMIN WITH MINERALS) TABS tablet Take 1 tablet by mouth daily.  . pantoprazole (PROTONIX) 20 MG tablet Take 1 tablet (20 mg total) by mouth daily.  . pravastatin (PRAVACHOL) 40 MG tablet Take 1 tablet (40 mg total) by mouth daily.  . predniSONE (DELTASONE) 10 MG tablet 4 X 2 DAYS, 3 X 2 DAYS, 2 X 2 DAYS, 1 X 2 DAYS  . PROAIR HFA 108 (90 Base) MCG/ACT inhaler INHALE 2 PUFFS INTO THE LUNGS EVERY 6 HOURS AS NEEDED FOR WHEEZING ORSHORTNESS OF BREATH.  . senna-docusate (SENOKOT-S) 8.6-50 MG tablet Take 1 tablet by mouth daily.   No current facility-administered medications for this visit. (Other)      REVIEW OF SYSTEMS:    ALLERGIES No Known Allergies  PAST MEDICAL HISTORY Past Medical History:  Diagnosis Date  . COPD (chronic obstructive pulmonary disease) (HCC)   . GERD (gastroesophageal reflux disease)   . History of hepatitis C    treated several years ago through [facility]  . Hypertension    Past Surgical History:  Procedure Laterality Date  . CATARACT EXTRACTION, BILATERAL    . EYE SURGERY Bilateral   .  ORTHOPEDIC SURGERY Bilateral    pt states fracture repairs to arms and legs.  Marland Kitchen REVERSE SHOULDER ARTHROPLASTY Left 09/23/2019   Procedure: REVERSE SHOULDER ARTHROPLASTY;  Surgeon: Jones Broom, MD;  Location: [facility];  Service: Orthopedics;  Laterality: Left;    FAMILY HISTORY Family History  Problem Relation Age of Onset  . Heart disease Mother   . Hypertension Father     SOCIAL HISTORY Social History   Tobacco Use  . Smoking status: Former Smoker    Packs/day: 1.00    Years: 35.00    Pack years: 35.00    Types: Cigarettes    Quit date: 10/01/2010    Years since quitting: 10.0  . Smokeless tobacco: Never Used  Vaping Use  . Vaping Use:  Never used  Substance Use Topics  . Alcohol use: Yes    Alcohol/week: 42.0 standard drinks    Types: 42 Cans of beer per week    Comment: daily - 6 drinks a day  . Drug use: Yes    Types: Marijuana    Comment: last marijuana 2-3 weeks ago         OPHTHALMIC EXAM: Base Eye Exam    Visual Acuity (ETDRS)      Right Left   Dist [place] 20/40 +2 20/200   Dist ph [place] NI 20/80 -2       Tonometry (Tonopen, 10:39 AM)      Right Left   Pressure 15 18       Pupils      Pupils Dark Light Shape React APD   Right PERRL 3 2 Round Brisk None   Left PERRL 3 2 Round Brisk None       Visual Fields (Counting fingers)      Left Right    Full Full       Extraocular Movement      Right Left    Full Full       Neuro/Psych    Oriented x3: Yes   Mood/Affect: Normal       Dilation    Both eyes: 1.0% Mydriacyl, 2.5% Phenylephrine @ 10:39 AM        Slit Lamp and Fundus Exam    External Exam      Right Left   External Normal Normal       Slit Lamp Exam      Right Left   Lids/Lashes Normal Normal   Conjunctiva/Sclera White and quiet White and quiet   Cornea Clear Clear   Anterior Chamber Deep and quiet Deep and quiet   Iris Round and reactive Round and reactive   Lens Posterior chamber intraocular lens Posterior chamber intraocular lens   Anterior Vitreous Normal Normal       Fundus Exam      Right Left   Posterior Vitreous Vitrectomized    Disc Normal    C/D Ratio 0.3 0.35   Macula Retinal pigment epithelial mottling, no disciform scar, no macular thickening, no hemorrhage Epiretinal membrane, no macular thickening, Retinal pigment epithelial mottling, no hemorrhage   Vessels Normal    Periphery Normal           IMAGING AND PROCEDURES  Imaging and Procedures for 11/02/20  OCT, Retina - OU - Both Eyes       Right Eye Quality was good. Scan locations included subfoveal. Central Foveal Thickness: 269. Progression has improved. Findings include abnormal foveal  contour.   Left Eye Quality was good. Scan locations included subfoveal. Central Foveal  Thickness: 351. Progression has improved. Findings include abnormal foveal contour.   Notes OD much less subretinal fluid in and superior the fovea now currently at 5-week follow-up for repeat injection Eylea OD today  OS also with much less subretinal fluid, at 12-week follow-up today   Each of these findings are coincident with with now excellent compliance with nightly oxygen "all night".  In previous months he would not replace it if it was displaced during the night  He has been using it nightly throughout the entire night for the past 11 weeks        Intravitreal Injection, Pharmacologic Agent - OD - Right Eye       Time Out 11/02/2020. 11:20 AM. Confirmed correct patient, procedure, site, and patient consented.   Anesthesia Topical anesthesia was used. Anesthetic medications included Akten 3.5%.   Procedure Preparation included 10% betadine to eyelids, 5% betadine to ocular surface, Ofloxacin . A 30 gauge needle was used.   Injection:  2 mg aflibercept Gretta Cool) SOLN   NDC: L6038910, Lot: [id number]   Route: Intravitreal, Site: Right Eye, Waste: 0 mg  Post-op Post injection exam found visual acuity of at least counting fingers. The patient tolerated the procedure well. There were no complications. The patient received written and verbal post procedure care education. Post injection medications were not given.                 ASSESSMENT/PLAN:  Obstructive sleep apnea hypopnea, severe Patient had recent home study sleep study under direction of Dr. Maple Hudson, results pending  Exudative age-related macular degeneration of left eye with active choroidal neovascularization (HCC) Today at 12 weeks post injection left eye, will need injection left eye in the near future      ICD-10-CM   1. Exudative age-related macular degeneration of right eye with active choroidal  neovascularization (HCC)  H35.3211 OCT, Retina - OU - Both Eyes    Intravitreal Injection, Pharmacologic Agent - OD - Right Eye    aflibercept (EYLEA) SOLN 2 mg  2. Obstructive sleep apnea hypopnea, severe  G47.33   3. Exudative age-related macular degeneration of left eye with active choroidal neovascularization (HCC)  H35.3221     1.  At 5-week interval, much less subretinal fluid in the right eye as compared to onset 08-20-20 coincidentally patient is highly compliant now with nightly oxygen supplementation (not CPAP ).  Follow-up right eye again in 5 weeks dilate OD D possible injection  Moreover patient recently completed home sleep study test, awaiting results via Dr. Jetty Duhamel.  2.  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  Ophthalmic Meds Ordered this visit:  Meds ordered this encounter  Medications  . aflibercept (EYLEA) SOLN 2 mg       Return in about 5 weeks (around 12/07/2020), or ,, 1 week follow-up dilate OS possible Eylea OCT, for dilate, OD, EYLEA OCT.  There are no Patient Instructions on file for this visit.   Explained the diagnoses, plan, and follow up with the patient and they expressed understanding.  Patient expressed understanding of the importance of proper follow up care.   Alford Highland Olesya Wike M.D. Diseases & Surgery of the Retina and Vitreous [facility] 11/02/20     Abbreviations: M myopia (nearsighted); A astigmatism; H hyperopia (farsighted); P presbyopia; Mrx spectacle prescription;  CTL contact lenses; OD right  eye; OS left eye; OU both eyes  XT exotropia; ET esotropia; PEK punctate epithelial keratitis; PEE punctate epithelial erosions; DES dry eye syndrome; MGD meibomian gland dysfunction; ATs artificial tears; PFAT's preservative free artificial tears; NSC nuclear sclerotic cataract;  PSC posterior subcapsular cataract; ERM epi-retinal membrane; PVD posterior vitreous detachment; RD retinal detachment; DM diabetes mellitus; DR diabetic retinopathy; NPDR non-proliferative diabetic retinopathy; PDR proliferative diabetic retinopathy; CSME clinically significant macular edema; DME diabetic macular edema; dbh dot blot hemorrhages; CWS cotton wool spot; POAG primary open angle glaucoma; C/D cup-to-disc ratio; HVF humphrey visual field; GVF goldmann visual field; OCT optical coherence tomography; IOP intraocular pressure; BRVO Branch retinal vein occlusion; CRVO central retinal vein occlusion; CRAO central retinal artery occlusion; BRAO branch retinal artery occlusion; RT retinal tear; SB scleral buckle; PPV pars plana vitrectomy; VH Vitreous hemorrhage; PRP panretinal laser photocoagulation; IVK intravitreal kenalog; VMT vitreomacular traction; MH Macular hole;  NVD neovascularization of the disc; NVE neovascularization elsewhere; AREDS age related eye disease study; ARMD age related macular degeneration; POAG primary open angle glaucoma; EBMD epithelial/anterior basement membrane dystrophy; ACIOL anterior chamber intraocular lens; IOL intraocular lens; PCIOL posterior chamber intraocular lens; Phaco/IOL phacoemulsification with intraocular lens placement; PRK photorefractive keratectomy; LASIK laser assisted in situ keratomileusis; HTN hypertension; DM diabetes mellitus; COPD chronic obstructive pulmonary disease

## 2020-11-04 ENCOUNTER — Telehealth: Payer: Self-pay | Admitting: *Deleted

## 2020-11-04 NOTE — Telephone Encounter (Signed)
Patient is requesting something else that he can take instead of Gabapentin, having problems using bathroom and his PCP has suggested that he stop the medicine. Please advise.

## 2020-11-04 NOTE — Telephone Encounter (Signed)
He should come in next week to discuss

## 2020-11-04 NOTE — Telephone Encounter (Signed)
Called and spoke with patient to schedule/confirmed appointment Wednesday 23rd @9 :45.

## 2020-11-08 ENCOUNTER — Encounter: Payer: Self-pay | Admitting: Internal Medicine

## 2020-11-08 DIAGNOSIS — J9611 Chronic respiratory failure with hypoxia: Secondary | ICD-10-CM | POA: Insufficient documentation

## 2020-11-08 NOTE — Assessment & Plan Note (Signed)
Bronchitic exacerbation Plan- augmentin, prednisone taper

## 2020-11-08 NOTE — Assessment & Plan Note (Signed)
[facility] 2L for sleep. Concern was end-organ damage/ macular degeneration.

## 2020-11-08 NOTE — Assessment & Plan Note (Signed)
Witnessed hx c/w OSA Plan- scheduling sleep study

## 2020-11-10 ENCOUNTER — Encounter (INDEPENDENT_AMBULATORY_CARE_PROVIDER_SITE_OTHER): Payer: Self-pay | Admitting: Ophthalmology

## 2020-11-10 ENCOUNTER — Other Ambulatory Visit: Payer: Self-pay

## 2020-11-10 ENCOUNTER — Ambulatory Visit (INDEPENDENT_AMBULATORY_CARE_PROVIDER_SITE_OTHER): Payer: 59 | Admitting: Ophthalmology

## 2020-11-10 DIAGNOSIS — H353221 Exudative age-related macular degeneration, left eye, with active choroidal neovascularization: Secondary | ICD-10-CM

## 2020-11-10 MED ORDER — AFLIBERCEPT 2MG/0.05ML IZ SOLN FOR KALEIDOSCOPE
2.0000 mg | INTRAVITREAL | Status: AC | PRN
  Administered 2020-11-10: 2 mg via INTRAVITREAL

## 2020-11-10 NOTE — Progress Notes (Signed)
11/10/2020     CHIEF COMPLAINT Patient presents for Retina Follow Up (3 Month f\u OS. Possible Eylea OS. OCT/Pt states no changes or issues.)  In 1 week post recent injection Eylea OD,    He has been using it nightly throughout the entire night for the past 12 weeks HISTORY OF PRESENT ILLNESS: [name] is a 65 y.o. male who presents to the clinic today for:   HPI    Retina Follow Up    Patient presents with  Wet AMD.  In left eye.  Severity is moderate.  Duration of 3 months.  Since onset it is stable.  I, the attending physician,  performed the HPI with the patient and updated documentation appropriately. Additional comments: 3 Month f\u OS. Possible Eylea OS. OCT Pt states no changes or issues.       Last edited by [name] on 11/10/2020  8:05 AM. (History)      Referring physician: No referring provider defined for this encounter.  HISTORICAL INFORMATION:   Selected notes from the MEDICAL RECORD NUMBER    Lab Results  Component Value Date   HGBA1C 5.4 08/31/2020     CURRENT MEDICATIONS: Current Outpatient Medications (Ophthalmic Drugs)  Medication Sig  . Aflibercept 2 MG/0.05ML SOLN Inject 1 Dose into the eye every 8 (eight) weeks.    No current facility-administered medications for this visit. (Ophthalmic Drugs)   Current Outpatient Medications (Other)  Medication Sig  . acetaminophen (TYLENOL) 500 MG tablet Take 500-1,000 mg by mouth every 6 (six) hours as needed (for pain.).  Marland Kitchen aspirin EC 81 MG tablet Take 81 mg by mouth daily.  . diclofenac (VOLTAREN) 75 MG EC tablet Take 1 tablet (75 mg total) by mouth 2 (two) times daily.  . Fluticasone-Umeclidin-Vilant (TRELEGY ELLIPTA) 100-62.5-25 MCG/INH AEPB Inhale 1 puff into the lungs daily. Rinse mouth  . Fluticasone-Umeclidin-Vilant (TRELEGY ELLIPTA) 200-62.5-25 MCG/INH AEPB Inhale 1 puff into the lungs daily.  Marland Kitchen gabapentin (NEURONTIN) 300 MG capsule Take 1 capsule (300 mg total) by mouth 3 (three)  times daily.  Marland Kitchen ipratropium-albuterol (DUONEB) 0.5-2.5 (3) MG/3ML SOLN INHALE THE CONTENTS OF 1 VIAL (3 ML) VIANEBULIZER EVERY 6 HOURS AS NEEDED  . lisinopril (ZESTRIL) 20 MG tablet TAKE 1 TABLET BY MOUTH ONCE A DAY  . Multiple Vitamin (MULTIVITAMIN WITH MINERALS) TABS tablet Take 1 tablet by mouth daily.  . pantoprazole (PROTONIX) 20 MG tablet Take 1 tablet (20 mg total) by mouth daily.  . pravastatin (PRAVACHOL) 40 MG tablet Take 1 tablet (40 mg total) by mouth daily.  . predniSONE (DELTASONE) 10 MG tablet 4 X 2 DAYS, 3 X 2 DAYS, 2 X 2 DAYS, 1 X 2 DAYS  . PROAIR HFA 108 (90 Base) MCG/ACT inhaler INHALE 2 PUFFS INTO THE LUNGS EVERY 6 HOURS AS NEEDED FOR WHEEZING ORSHORTNESS OF BREATH.  . senna-docusate (SENOKOT-S) 8.6-50 MG tablet Take 1 tablet by mouth daily.   No current facility-administered medications for this visit. (Other)      REVIEW OF SYSTEMS:    ALLERGIES No Known Allergies  PAST MEDICAL HISTORY Past Medical History:  Diagnosis Date  . COPD (chronic obstructive pulmonary disease) (HCC)   . GERD (gastroesophageal reflux disease)   . History of hepatitis C    treated several years ago through [place]  . Hypertension    Past Surgical History:  Procedure Laterality Date  . CATARACT EXTRACTION, BILATERAL    . EYE SURGERY Bilateral   . ORTHOPEDIC SURGERY Bilateral  pt states fracture repairs to arms and legs.  Marland Kitchen. REVERSE SHOULDER ARTHROPLASTY Left 09/23/2019   Procedure: REVERSE SHOULDER ARTHROPLASTY;  Surgeon: Jones Broomhandler, Justin, MD;  Location: [facility];  Service: Orthopedics;  Laterality: Left;    FAMILY HISTORY Family History  Problem Relation Age of Onset  . Heart disease Mother   . Hypertension Father     SOCIAL HISTORY Social History   Tobacco Use  . Smoking status: Former Smoker    Packs/day: 1.00    Years: 35.00    Pack years: 35.00    Types: Cigarettes    Quit date: 10/01/2010    Years since quitting: 10.1  . Smokeless tobacco: Never Used  Vaping Use   . Vaping Use: Never used  Substance Use Topics  . Alcohol use: Yes    Alcohol/week: 42.0 standard drinks    Types: 42 Cans of beer per week    Comment: daily - 6 drinks a day  . Drug use: Yes    Types: Marijuana    Comment: last marijuana 2-3 weeks ago         OPHTHALMIC EXAM: Base Eye Exam    Visual Acuity (Snellen - Linear)      Right Left   Dist [place] 20/40 20/200   Dist ph  20/30 -1 NI       Tonometry (Tonopen, 8:08 AM)      Right Left   Pressure 16 16       Pupils      Pupils Dark Light Shape React APD   Right PERRL 3 2 Round Brisk None   Left PERRL 3 2 Round Brisk None       Visual Fields (Counting fingers)      Left Right    Full Full       Neuro/Psych    Oriented x3: Yes   Mood/Affect: Normal       Dilation    Left eye: 1.0% Mydriacyl, 2.5% Phenylephrine @ 8:08 AM        Slit Lamp and Fundus Exam    External Exam      Right Left   External Normal Normal       Slit Lamp Exam      Right Left   Lids/Lashes Normal Normal   Conjunctiva/Sclera White and quiet White and quiet   Cornea Clear Clear   Anterior Chamber Deep and quiet Deep and quiet   Iris Round and reactive Round and reactive   Lens Posterior chamber intraocular lens Posterior chamber intraocular lens   Anterior Vitreous Normal Normal       Fundus Exam      Right Left   Posterior Vitreous  Normal   Disc  Normal   C/D Ratio  0.3   Macula  Macular thickening, Retinal pigment epithelial mottling, Retinal pigment epithelial detachment, Epiretinal membrane, old serous rd inferior macula   Vessels  Normal   Periphery  Normal          IMAGING AND PROCEDURES  Imaging and Procedures for 11/10/20  OCT, Retina - OU - Both Eyes       Right Eye Quality was good. Scan locations included subfoveal. Central Foveal Thickness: 264. Progression has improved. Findings include abnormal foveal contour.   Left Eye Quality was good. Scan locations included subfoveal. Central Foveal  Thickness: 367. Progression has improved. Findings include abnormal foveal contour.   Notes OD much less subretinal fluid in and superior the fovea now currently at 1-week follow-up after recent  repeat injection Eylea OD   OS also with much less subretinal fluid, at 13-week follow-up today   Each of these findings are coincident with with now excellent compliance with nightly oxygen "all night".  In previous months he would not replace it if it was displaced during the night  He has been using it nightly throughout the entire night for the past 12 weeks        Intravitreal Injection, Pharmacologic Agent - OS - Left Eye       Time Out 11/10/2020. 8:51 AM. Confirmed correct patient, procedure, site, and patient consented.   Anesthesia Topical anesthesia was used. Anesthetic medications included Akten 3.5%.   Procedure Preparation included Ofloxacin , 10% betadine to eyelids, 5% betadine to ocular surface. A 30 gauge needle was used.   Injection:  2 mg aflibercept Gretta Cool) SOLN   NDC: L6038910, Lot: [id number]   Route: Intravitreal, Site: Left Eye, Waste: 0 mg  Post-op Post injection exam found visual acuity of at least counting fingers. The patient tolerated the procedure well. There were no complications. The patient received written and verbal post procedure care education. Post injection medications were not given.                 ASSESSMENT/PLAN:  Exudative age-related macular degeneration of left eye with active choroidal neovascularization (HCC) OS also with much less subretinal fluid, at 13-week follow-up today   Each of these findings are coincident with with now excellent compliance with nightly oxygen "all night".  In previous months he would not replace it if it was displaced during the night  He has been using it nightly throughout the entire night for the past 12 weeks      ICD-10-CM   1. Exudative age-related macular degeneration of left eye with  active choroidal neovascularization (HCC)  H35.3221 OCT, Retina - OU - Both Eyes    Intravitreal Injection, Pharmacologic Agent - OS - Left Eye    aflibercept (EYLEA) SOLN 2 mg    1.OS also with much less subretinal fluid, at 13-week follow-up today   Each of these findings are coincident with with now excellent compliance with nightly oxygen "all night".  In previous months he would not replace it if it was displaced during the night  Repeat injection Eylea OS today to maintain.  There is subfoveal scarring on OCT examination which limits the acuity, yet subretinal fluid remains.  72-month interval no real change overall in the configuration and anatomy of the left eye.  2.  OD, right eye at 1 week post injection Eylea, much less disease activity.  Follow-up OD and dilate as scheduled  3.  Ophthalmic Meds Ordered this visit:  Meds ordered this encounter  Medications  . aflibercept (EYLEA) SOLN 2 mg       Return in about 3 months (around 02/07/2021) for dilate, OS, EYLEA OCT.  There are no Patient Instructions on file for this visit.   Explained the diagnoses, plan, and follow up with the patient and they expressed understanding.  Patient expressed understanding of the importance of proper follow up care.   Alford Highland Markelle Najarian M.D. Diseases & Surgery of the Retina and Vitreous [facility] 11/10/20     Abbreviations: M myopia (nearsighted); A astigmatism; H hyperopia (farsighted); P presbyopia; Mrx spectacle prescription;  CTL contact lenses; OD right eye; OS left eye; OU both eyes  XT exotropia; ET esotropia; PEK punctate epithelial keratitis; PEE punctate epithelial erosions; DES dry eye syndrome;  MGD meibomian gland dysfunction; ATs artificial tears; PFAT's preservative free artificial tears; NSC nuclear sclerotic cataract; PSC posterior subcapsular cataract; ERM epi-retinal membrane; PVD posterior vitreous detachment; RD retinal detachment; DM diabetes mellitus;  DR diabetic retinopathy; NPDR non-proliferative diabetic retinopathy; PDR proliferative diabetic retinopathy; CSME clinically significant macular edema; DME diabetic macular edema; dbh dot blot hemorrhages; CWS cotton wool spot; POAG primary open angle glaucoma; C/D cup-to-disc ratio; HVF humphrey visual field; GVF goldmann visual field; OCT optical coherence tomography; IOP intraocular pressure; BRVO Branch retinal vein occlusion; CRVO central retinal vein occlusion; CRAO central retinal artery occlusion; BRAO branch retinal artery occlusion; RT retinal tear; SB scleral buckle; PPV pars plana vitrectomy; VH Vitreous hemorrhage; PRP panretinal laser photocoagulation; IVK intravitreal kenalog; VMT vitreomacular traction; MH Macular hole;  NVD neovascularization of the disc; NVE neovascularization elsewhere; AREDS age related eye disease study; ARMD age related macular degeneration; POAG primary open angle glaucoma; EBMD epithelial/anterior basement membrane dystrophy; ACIOL anterior chamber intraocular lens; IOL intraocular lens; PCIOL posterior chamber intraocular lens; Phaco/IOL phacoemulsification with intraocular lens placement; PRK photorefractive keratectomy; LASIK laser assisted in situ keratomileusis; HTN hypertension; DM diabetes mellitus; COPD chronic obstructive pulmonary disease

## 2020-11-10 NOTE — Assessment & Plan Note (Signed)
OS also with much less subretinal fluid, at 13-week follow-up today   Each of these findings are coincident with with now excellent compliance with nightly oxygen "all night".  In previous months he would not replace it if it was displaced during the night  He has been using it nightly throughout the entire night for the past 12 weeks

## 2020-11-11 ENCOUNTER — Other Ambulatory Visit: Payer: Self-pay | Admitting: Internal Medicine

## 2020-11-11 ENCOUNTER — Encounter: Payer: Self-pay | Admitting: Podiatry

## 2020-11-11 ENCOUNTER — Ambulatory Visit: Payer: 59 | Admitting: Podiatry

## 2020-11-11 DIAGNOSIS — M7662 Achilles tendinitis, left leg: Secondary | ICD-10-CM | POA: Diagnosis not present

## 2020-11-11 DIAGNOSIS — M76822 Posterior tibial tendinitis, left leg: Secondary | ICD-10-CM | POA: Diagnosis not present

## 2020-11-11 DIAGNOSIS — G629 Polyneuropathy, unspecified: Secondary | ICD-10-CM

## 2020-11-11 MED ORDER — TRELEGY ELLIPTA 100-62.5-25 MCG/INH IN AEPB: 1.0000 | INHALATION_SPRAY | Freq: Every day | RESPIRATORY_TRACT | 12 refills | Status: DC

## 2020-11-11 MED ORDER — PREGABALIN 75 MG PO CAPS: 75.0000 mg | ORAL_CAPSULE | Freq: Two times a day (BID) | ORAL | 5 refills | Status: DC

## 2020-11-11 NOTE — Progress Notes (Signed)
Subjective:   Patient ID: [name], male   DOB: 65 y.o.   MRN: [id number]   HPI Patient presents stating that he has had trouble with his medication and states it was effective in controlling his pain but created significant constipation   ROS      Objective:  Physical Exam  Nerve root vascular status intact with patient found to have chronic foot pain bilateral and works extensive hours and is on his feet and was controlled with gabapentin which unfortunately is created significant constipation which required 1 night hospitalization     Assessment:  Probability of medication creating hospitalization with constipation     Plan:  H&P reviewed condition at great length in case.  I am heartened that he had improvement regular try him on Lyrica and if it causes problems then will become more difficult to find a way to control what may be stenosis or other pathology creating nerve irritation.  Conveyed that finding to him and reappoint and begin Lyrica daily followed by twice daily 75 mg which was prescribed today

## 2020-11-11 NOTE — Addendum Note (Signed)
Addended by: Wyvonne Lenz on: 11/11/2020 03:24 PM   Modules accepted: Orders

## 2020-11-24 ENCOUNTER — Other Ambulatory Visit: Payer: Self-pay | Admitting: Podiatry

## 2020-11-24 NOTE — Telephone Encounter (Signed)
Please advise 

## 2020-11-26 ENCOUNTER — Telehealth: Payer: Self-pay | Admitting: Podiatry

## 2020-11-26 NOTE — Telephone Encounter (Signed)
Not sure. I sent it in yesterday

## 2020-11-26 NOTE — Telephone Encounter (Signed)
Patient calling to inform Dr. Charlsie Merles that the replacement prescription diclofenac (VOLTAREN) 75 MG EC tablet    was not at the pharmacy for pick up yet (patient was taking gabapentin but wasn't reacting to it well) . Does the Rx need to be resent or should the patient wait, and call the pharmacy again.

## 2020-11-26 NOTE — Telephone Encounter (Signed)
Ok, thank you, I will let the patient know

## 2020-12-07 ENCOUNTER — Encounter (INDEPENDENT_AMBULATORY_CARE_PROVIDER_SITE_OTHER): Payer: Self-pay | Admitting: Ophthalmology

## 2020-12-07 ENCOUNTER — Other Ambulatory Visit: Payer: Self-pay

## 2020-12-07 ENCOUNTER — Ambulatory Visit (INDEPENDENT_AMBULATORY_CARE_PROVIDER_SITE_OTHER): Payer: 59 | Admitting: Ophthalmology

## 2020-12-07 ENCOUNTER — Encounter (INDEPENDENT_AMBULATORY_CARE_PROVIDER_SITE_OTHER): Payer: 59 | Admitting: Ophthalmology

## 2020-12-07 DIAGNOSIS — H353211 Exudative age-related macular degeneration, right eye, with active choroidal neovascularization: Secondary | ICD-10-CM

## 2020-12-07 DIAGNOSIS — G4733 Obstructive sleep apnea (adult) (pediatric): Secondary | ICD-10-CM

## 2020-12-07 MED ORDER — AFLIBERCEPT 2MG/0.05ML IZ SOLN FOR KALEIDOSCOPE
2.0000 mg | INTRAVITREAL | Status: AC | PRN
  Administered 2020-12-07: 2 mg via INTRAVITREAL

## 2020-12-07 NOTE — Assessment & Plan Note (Signed)
Patient still has not made appointment for sleep study, will contact Dr. Roxy Cedar office soon he says  Patient does continue on nightly oxygen supplementation, this is also coincided with improvement of macular function in the right eye with less subretinal fluid without otherwise changing the medication usage to supplement his wet ARMD treatment

## 2020-12-07 NOTE — Assessment & Plan Note (Signed)
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

## 2020-12-07 NOTE — Progress Notes (Signed)
12/07/2020     CHIEF COMPLAINT Patient presents for Retina Follow Up (5 Wk F/U OD, poss Eylea OD//Pt denies noticeable changes to [place] OU since last visit. Pt denies ocular pain, flashes of light, or floaters OU. ///)   HISTORY OF PRESENT ILLNESS: [name] is a 65 y.o. 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  This started 5 weeks ago.  Severity is mild.  Duration of 5 weeks.  Since onset it is stable. Additional comments: 5 Wk F/U OD, poss Eylea OD  Pt denies noticeable changes to [place] OU since last visit. Pt denies ocular pain, flashes of light, or floaters OU.           Last edited by Ileana Roup, COA on 12/07/2020  8:06 AM. (History)      Referring physician: No referring provider defined for this encounter.  HISTORICAL INFORMATION:   Selected notes from the MEDICAL RECORD NUMBER    Lab Results  Component Value Date   HGBA1C 5.4 08/31/2020     CURRENT MEDICATIONS: Current Outpatient Medications (Ophthalmic Drugs)  Medication Sig  . Aflibercept 2 MG/0.05ML SOLN Inject 1 Dose into the eye every 8 (eight) weeks.    No current facility-administered medications for this visit. (Ophthalmic Drugs)   Current Outpatient Medications (Other)  Medication Sig  . acetaminophen (TYLENOL) 500 MG tablet Take 500-1,000 mg by mouth every 6 (six) hours as needed (for pain.).  Marland Kitchen aspirin EC 81 MG tablet Take 81 mg by mouth daily.  . diclofenac (VOLTAREN) 75 MG EC tablet TAKE 1 TABLET BY MOUTH TWICE A DAY  . Fluticasone-Umeclidin-Vilant (TRELEGY ELLIPTA) 100-62.5-25 MCG/INH AEPB Inhale 1 puff into the lungs daily.  . Fluticasone-Umeclidin-Vilant (TRELEGY ELLIPTA) 200-62.5-25 MCG/INH AEPB Inhale 1 puff into the lungs daily.  Marland Kitchen ipratropium-albuterol (DUONEB) 0.5-2.5 (3) MG/3ML SOLN INHALE THE CONTENTS OF 1 VIAL (3 ML) VIANEBULIZER EVERY 6 HOURS AS NEEDED  . lisinopril (ZESTRIL) 20 MG tablet TAKE 1  TABLET BY MOUTH ONCE A DAY  . Multiple Vitamin (MULTIVITAMIN WITH MINERALS) TABS tablet Take 1 tablet by mouth daily.  . pantoprazole (PROTONIX) 20 MG tablet Take 1 tablet (20 mg total) by mouth daily.  . pravastatin (PRAVACHOL) 40 MG tablet Take 1 tablet (40 mg total) by mouth daily.  . predniSONE (DELTASONE) 10 MG tablet 4 X 2 DAYS, 3 X 2 DAYS, 2 X 2 DAYS, 1 X 2 DAYS  . pregabalin (LYRICA) 75 MG capsule Take 1 capsule (75 mg total) by mouth 2 (two) times daily.  Marland Kitchen PROAIR HFA 108 (90 Base) MCG/ACT inhaler INHALE 2 PUFFS INTO THE LUNGS EVERY 6 HOURS AS NEEDED FOR WHEEZING ORSHORTNESS OF BREATH.  . senna-docusate (SENOKOT-S) 8.6-50 MG tablet Take 1 tablet by mouth daily.   No current facility-administered medications for this visit. (Other)      REVIEW OF SYSTEMS:    ALLERGIES No Known Allergies  PAST MEDICAL HISTORY Past Medical History:  Diagnosis Date  . COPD (chronic obstructive pulmonary disease) (HCC)   . GERD (gastroesophageal reflux disease)   . History of hepatitis C    treated several years ago through [place]  . Hypertension    Past Surgical History:  Procedure Laterality Date  . CATARACT EXTRACTION, BILATERAL    . EYE SURGERY Bilateral   . ORTHOPEDIC SURGERY Bilateral    pt states fracture repairs to arms  and legs.  Marland Kitchen REVERSE SHOULDER ARTHROPLASTY Left 09/23/2019   Procedure: REVERSE SHOULDER ARTHROPLASTY;  Surgeon: Jones Broom, MD;  Location: [facility];  Service: Orthopedics;  Laterality: Left;    FAMILY HISTORY Family History  Problem Relation Age of Onset  . Heart disease Mother   . Hypertension Father     SOCIAL HISTORY Social History   Tobacco Use  . Smoking status: Former Smoker    Packs/day: 1.00    Years: 35.00    Pack years: 35.00    Types: Cigarettes    Quit date: 10/01/2010    Years since quitting: 10.1  . Smokeless tobacco: Never Used  Vaping Use  . Vaping Use: Never used  Substance Use Topics  . Alcohol use: Yes    Alcohol/week: 42.0  standard drinks    Types: 42 Cans of beer per week    Comment: daily - 6 drinks a day  . Drug use: Yes    Types: Marijuana    Comment: last marijuana 2-3 weeks ago         OPHTHALMIC EXAM:  Base Eye Exam    Visual Acuity (ETDRS)      Right Left   Dist [place] 20/30 +1 20/400   Dist ph [place] NI 20/200       Tonometry (Tonopen, 8:06 AM)      Right Left   Pressure 16 14       Pupils      Pupils Dark Light Shape React APD   Right PERRL 3.5 2.5 Round Brisk None   Left PERRL 3.5 2.5 Round Brisk None       Visual Fields (Counting fingers)      Left Right    Full Full       Extraocular Movement      Right Left    Full Full       Neuro/Psych    Oriented x3: Yes   Mood/Affect: Normal       Dilation    Right eye: 1.0% Mydriacyl, 2.5% Phenylephrine @ 8:09 AM        Slit Lamp and Fundus Exam    External Exam      Right Left   External Normal Normal       Slit Lamp Exam      Right Left   Lids/Lashes Normal Normal   Conjunctiva/Sclera White and quiet White and quiet   Cornea Clear Clear   Anterior Chamber Deep and quiet Deep and quiet   Iris Round and reactive Round and reactive   Lens Posterior chamber intraocular lens Posterior chamber intraocular lens   Anterior Vitreous Normal Normal       Fundus Exam      Right Left   Posterior Vitreous Vitrectomized    Disc Normal    C/D Ratio 0.3    Macula Retinal pigment epithelial mottling, no disciform scar, no macular thickening, no hemorrhage    Vessels Normal    Periphery Normal, Good laser retinopexy temporally and inferonasal from prior retinal detachment repair           IMAGING AND PROCEDURES  Imaging and Procedures for 12/07/20  OCT, Retina - OU - Both Eyes       Right Eye Quality was good. Scan locations included subfoveal. Central Foveal Thickness: 256. Progression has improved. Findings include abnormal foveal contour.   Left Eye Quality was good. Scan locations included subfoveal. Central  Foveal Thickness: 345. Progression has improved. Findings include abnormal foveal contour.  Notes OD much less subretinal fluid in and superior the fovea now currently at 5-week follow-up after recent repeat injection Eylea OD , and on resumption of nightly oxygen supplementation some 3-1/2 months previous  OS also with much less subretinal fluid, at 5-week follow-up today   Each of these findings are coincident with with now excellent compliance with nightly oxygen "all night" since November-December 2021.  In previous months he would not replace it if it was displaced during the night  He has been using it nightly throughout the entire night for the past 17 weeks        Intravitreal Injection, Pharmacologic Agent - OD - Right Eye       Time Out 12/07/2020. 9:03 AM. Confirmed correct patient, procedure, site, and patient consented.   Anesthesia Topical anesthesia was used. Anesthetic medications included Akten 3.5%.   Procedure Preparation included 10% betadine to eyelids, 5% betadine to ocular surface, Ofloxacin . A 30 gauge needle was used.   Injection:  2 mg aflibercept Gretta Cool) SOLN   NDC: L6038910, Lot: [id number]   Route: Intravitreal, Site: Right Eye, Waste: 0 mg  Post-op Post injection exam found visual acuity of at least counting fingers. The patient tolerated the procedure well. There were no complications. The patient received written and verbal post procedure care education. Post injection medications were not given.                 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 Roxy Cedar office soon he says  Patient does continue on nightly oxygen supplementation, this is also coincided with improvement of macular function in the right eye with less subretinal fluid without otherwise changing the medication usage to supplement his wet ARMD treatment      ICD-10-CM   1. Exudative age-related macular degeneration of right eye with active choroidal neovascularization (HCC)  H35.3211 OCT, Retina - OU - Both Eyes    Intravitreal Injection, Pharmacologic Agent - OD - Right Eye    aflibercept (EYLEA) SOLN 2 mg  2. Obstructive sleep apnea hypopnea, severe  G47.33     1.  Wet ARMD OD, improved macular function and anatomy as compared to November 2021 coincident with ongoing unchanging interval of intravitreal Eylea but more importantly nightly use of oxygen supplementation resumed by patient.  2.  Patient has not completed a sleep study as of yet, he will contact Dr. Roxy Cedar office and  3.  Dilate follow-up OS as scheduled  Ophthalmic Meds Ordered this visit:  Meds ordered this encounter  Medications  . aflibercept (EYLEA) SOLN 2 mg       Return in about 6 weeks (around 01/18/2021) for dilate, OD, EYLEA OCT.  There are no Patient Instructions on file for this visit.   Explained the diagnoses, plan, and follow up with the patient and they expressed understanding.  Patient expressed understanding of the importance of proper follow up care.   Alford Highland Rankin M.D. Diseases & Surgery of the Retina and Vitreous [facility] 12/07/20     Abbreviations: M myopia (nearsighted); A astigmatism;  H hyperopia (farsighted); P presbyopia; Mrx spectacle prescription;  CTL contact lenses; OD right eye; OS left eye; OU both eyes  XT exotropia; ET esotropia; PEK punctate epithelial keratitis; PEE punctate epithelial erosions; DES dry eye syndrome; MGD meibomian  gland dysfunction; ATs artificial tears; PFAT's preservative free artificial tears; NSC nuclear sclerotic cataract; PSC posterior subcapsular cataract; ERM epi-retinal membrane; PVD posterior vitreous detachment; RD retinal detachment; DM diabetes mellitus; DR diabetic retinopathy; NPDR non-proliferative diabetic retinopathy; PDR proliferative diabetic retinopathy; CSME clinically significant macular edema; DME diabetic macular edema; dbh dot blot hemorrhages; CWS cotton wool spot; POAG primary open angle glaucoma; C/D cup-to-disc ratio; HVF humphrey visual field; GVF goldmann visual field; OCT optical coherence tomography; IOP intraocular pressure; BRVO Branch retinal vein occlusion; CRVO central retinal vein occlusion; CRAO central retinal artery occlusion; BRAO branch retinal artery occlusion; RT retinal tear; SB scleral buckle; PPV pars plana vitrectomy; VH Vitreous hemorrhage; PRP panretinal laser photocoagulation; IVK intravitreal kenalog; VMT vitreomacular traction; MH Macular hole;  NVD neovascularization of the disc; NVE neovascularization elsewhere; AREDS age related eye disease study; ARMD age related macular degeneration; POAG primary open angle glaucoma; EBMD epithelial/anterior basement membrane dystrophy; ACIOL anterior chamber intraocular lens; IOL intraocular lens; PCIOL posterior chamber intraocular lens; Phaco/IOL phacoemulsification with intraocular lens placement; PRK photorefractive keratectomy; LASIK laser assisted in situ keratomileusis; HTN hypertension; DM diabetes mellitus; COPD chronic obstructive pulmonary disease

## 2020-12-08 ENCOUNTER — Encounter: Payer: Self-pay | Admitting: *Deleted

## 2020-12-21 ENCOUNTER — Other Ambulatory Visit: Payer: Self-pay | Admitting: *Deleted

## 2020-12-21 DIAGNOSIS — Z87891 Personal history of nicotine dependence: Secondary | ICD-10-CM

## 2020-12-24 ENCOUNTER — Ambulatory Visit: Payer: 59 | Admitting: Internal Medicine

## 2020-12-29 ENCOUNTER — Telehealth: Payer: Self-pay | Admitting: Internal Medicine

## 2020-12-29 DIAGNOSIS — G4733 Obstructive sleep apnea (adult) (pediatric): Secondary | ICD-10-CM

## 2020-12-29 NOTE — Telephone Encounter (Signed)
Sleep study does show severe obstructive sleep apnea, averaging 62 apneas/ hour.  Since he already has home O2 2L through [facility], please order DME [facility] to add CPAP auto 5-20, mask of choice, humidifier, supplies, AirView/ card and bleed in current O2 2L during sleep.

## 2020-12-29 NOTE — Telephone Encounter (Signed)
Dr Maple Hudson, please advise on pt's HST done 10/24/20, thanks

## 2020-12-29 NOTE — Telephone Encounter (Signed)
ATC Phone does not have voicemail. Will attempt to contact at later time.

## 2020-12-30 NOTE — Telephone Encounter (Signed)
ATC x2, no answer, no vm.

## 2020-12-31 NOTE — Telephone Encounter (Signed)
lmtcb for pt. (pt had named [name])

## 2020-12-31 NOTE — Telephone Encounter (Signed)
[phone] pt is calling back

## 2020-12-31 NOTE — Telephone Encounter (Signed)
I have attempted to call the pt back but there was no VM that picked up.  The line keeps ringing and then goes to a fast busy signal.

## 2021-01-04 NOTE — Telephone Encounter (Signed)
Called patient, he did not answer nor is his VM setup. Will attempt one last time later today before closing message.

## 2021-01-05 NOTE — Telephone Encounter (Signed)
Called and spoke to pt. Informed him of the results per Dr. Maple Hudson. Order placed and advised pt to contact our office back once he gets his CPAP to schedule an OV. Pt aware of the CPAP shortage. Pt verbalized understanding and denied any further questions or concerns at this time.

## 2021-01-21 ENCOUNTER — Encounter (INDEPENDENT_AMBULATORY_CARE_PROVIDER_SITE_OTHER): Payer: 59 | Admitting: Ophthalmology

## 2021-01-21 ENCOUNTER — Encounter (INDEPENDENT_AMBULATORY_CARE_PROVIDER_SITE_OTHER): Payer: Self-pay
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
## 2021-01-22 ENCOUNTER — Other Ambulatory Visit: Payer: Self-pay | Admitting: Sports Medicine

## 2021-01-22 ENCOUNTER — Ambulatory Visit (INDEPENDENT_AMBULATORY_CARE_PROVIDER_SITE_OTHER): Payer: 59 | Admitting: Internal Medicine

## 2021-01-22 ENCOUNTER — Other Ambulatory Visit: Payer: Self-pay

## 2021-01-22 ENCOUNTER — Telehealth: Payer: Self-pay | Admitting: Podiatry

## 2021-01-22 ENCOUNTER — Encounter: Payer: Self-pay | Admitting: Internal Medicine

## 2021-01-22 VITALS — BP 120/80 | HR 95 | Temp 99.2°F | Ht 74.0 in | Wt 229.4 lb

## 2021-01-22 DIAGNOSIS — J9611 Chronic respiratory failure with hypoxia: Secondary | ICD-10-CM

## 2021-01-22 DIAGNOSIS — J449 Chronic obstructive pulmonary disease, unspecified: Secondary | ICD-10-CM

## 2021-01-22 DIAGNOSIS — G4733 Obstructive sleep apnea (adult) (pediatric): Secondary | ICD-10-CM | POA: Diagnosis not present

## 2021-01-22 MED ORDER — PREGABALIN 75 MG PO CAPS: 75.0000 mg | ORAL_CAPSULE | Freq: Two times a day (BID) | ORAL | 5 refills | Status: DC

## 2021-01-22 MED ORDER — BREZTRI AEROSPHERE 160-9-4.8 MCG/ACT IN AERO
2.0000 | INHALATION_SPRAY | Freq: Two times a day (BID) | RESPIRATORY_TRACT | 0 refills | Status: DC
Start: 2021-01-22 — End: 2021-01-27

## 2021-01-22 NOTE — Patient Instructions (Signed)
Order- DME [facility]- please make Mr Underdown's CPAP a priority due to severe OSA.  If it will be a very long wait, ok to substitute Luna G3.Marland Kitchen Please communicate with Mr Breighner.  Keep appointment for CT chest on 5/18 as planned  Order- sample x 2 Breztri inhaler   Inhale 2 puffs then rinse mouth twice daily. Try this instead of Trelegy. If it is clearly better, let us know and we can change the prescription.   Please call if we can help

## 2021-01-22 NOTE — Progress Notes (Signed)
Refilled Lyrica

## 2021-01-22 NOTE — Telephone Encounter (Signed)
Patient called regarding prescription Lyrica, stated the pharmacy has yet to receive the order. I noticed the order was sent back in February but the transmission for electronic order failed,Patient has requested the order be resent, much needed currently having flare up. I also offered to schedule patient for follow up but requested only for the order not appointment,  Please Advise

## 2021-01-22 NOTE — Telephone Encounter (Signed)
Pt called stating her pharmacy never received an order for Lyrica. Please advise.

## 2021-01-25 ENCOUNTER — Other Ambulatory Visit: Payer: Self-pay | Admitting: Podiatry

## 2021-01-25 MED ORDER — PREGABALIN 100 MG PO CAPS: 100.0000 mg | ORAL_CAPSULE | Freq: Two times a day (BID) | ORAL | 3 refills | Status: DC

## 2021-01-27 ENCOUNTER — Encounter: Payer: Self-pay | Admitting: Adult Health

## 2021-01-27 ENCOUNTER — Other Ambulatory Visit: Payer: Self-pay

## 2021-01-27 ENCOUNTER — Other Ambulatory Visit: Payer: Self-pay | Admitting: Internal Medicine

## 2021-01-27 ENCOUNTER — Ambulatory Visit (INDEPENDENT_AMBULATORY_CARE_PROVIDER_SITE_OTHER): Payer: 59 | Admitting: Adult Health

## 2021-01-27 VITALS — BP 144/82 | HR 82 | Temp 97.7°F | Ht 71.46 in | Wt 229.4 lb

## 2021-01-27 DIAGNOSIS — J449 Chronic obstructive pulmonary disease, unspecified: Secondary | ICD-10-CM | POA: Diagnosis not present

## 2021-01-27 DIAGNOSIS — Z125 Encounter for screening for malignant neoplasm of prostate: Secondary | ICD-10-CM | POA: Insufficient documentation

## 2021-01-27 DIAGNOSIS — R7989 Other specified abnormal findings of blood chemistry: Secondary | ICD-10-CM | POA: Diagnosis not present

## 2021-01-27 DIAGNOSIS — I10 Essential (primary) hypertension: Secondary | ICD-10-CM | POA: Diagnosis not present

## 2021-01-27 DIAGNOSIS — I7090 Unspecified atherosclerosis: Secondary | ICD-10-CM

## 2021-01-27 DIAGNOSIS — Z87891 Personal history of nicotine dependence: Secondary | ICD-10-CM | POA: Insufficient documentation

## 2021-01-27 NOTE — Patient Instructions (Addendum)
Chronic Obstructive Pulmonary Disease  Chronic obstructive pulmonary disease (COPD) is a long-term (chronic) lung problem. When you have COPD, it is hard for air to get in and out of your lungs. Usually the condition gets worse over time, and your lungs will never return to normal. There are things you can do to keep yourself as healthy as possible. What are the causes?  Smoking. This is the most common cause.  Certain genes passed from parent to child (inherited). What increases the risk?  Being exposed to secondhand smoke from cigarettes, pipes, or cigars.  Being exposed to chemicals and other irritants, such as fumes and dust in the work environment.  Having chronic lung conditions or infections. What are the signs or symptoms?  Shortness of breath, especially during physical activity.  A long-term cough with a large amount of thick mucus. Sometimes, the cough may not have any mucus (dry cough).  Wheezing.  Breathing quickly.  Skin that looks gray or blue, especially in the fingers, toes, or lips.  Feeling tired (fatigue).  Weight loss.  Chest tightness.  Having infections often.  Episodes when breathing symptoms become much worse (exacerbations). At the later stages of this disease, you may have swelling in the ankles, feet, or legs. How is this treated?  Taking medicines.  Quitting smoking, if you smoke.  Rehabilitation. This includes steps to make your body work better. It may involve a team of specialists.  Doing exercises.  Making changes to your diet.  Using oxygen.  Lung surgery.  Lung transplant.  Comfort measures (palliative care). Follow these instructions at home: Medicines  Take over-the-counter and prescription medicines only as told by your doctor.  Talk to your doctor before taking any cough or allergy medicines. You may need to avoid medicines that cause your lungs to be dry. Lifestyle  If you smoke, stop smoking. Smoking makes  the problem worse.  Do not smoke or use any products that contain nicotine or tobacco. If you need help quitting, ask your doctor.  Avoid being around things that make your breathing worse. This may include smoke, chemicals, and fumes.  Stay active, but remember to rest as well.  Learn and use tips on how to manage stress and control your breathing.  Make sure you get enough sleep. Most adults need at least 7 hours of sleep every night.  Eat healthy foods. Eat smaller meals more often. Rest before meals. Controlled breathing Learn and use tips on how to control your breathing as told by your doctor. Try:  Breathing in (inhaling) through your nose for 1 second. Then, pucker your lips and breath out (exhale) through your lips for 2 seconds.  Putting one hand on your belly (abdomen). Breathe in slowly through your nose for 1 second. Your hand on your belly should move out. Pucker your lips and breathe out slowly through your lips. Your hand on your belly should move in as you breathe out.   Controlled coughing Learn and use controlled coughing to clear mucus from your lungs. Follow these steps: 1. Lean your head a little forward. 2. Breathe in deeply. 3. Try to hold your breath for 3 seconds. 4. Keep your mouth slightly open while coughing 2 times. 5. Spit any mucus out into a tissue. 6. Rest and do the steps again 1 or 2 times as needed. General instructions  Make sure you get all the shots (vaccines) that your doctor recommends. Ask your doctor about a flu shot and a  pneumonia shot.  Use oxygen therapy and pulmonary rehabilitation if told by your doctor. If you need home oxygen therapy, ask your doctor if you should buy a tool to measure your oxygen level (oximeter).  Make a COPD action plan with your doctor. This helps you to know what to do if you feel worse than usual.  Manage any other conditions you have as told by your doctor.  Avoid going outside when it is very hot, cold,  or humid.  Avoid people who have a sickness you can catch (contagious).  Keep all follow-up visits. Contact a doctor if:  You cough up more mucus than usual.  There is a change in the color or thickness of the mucus.  It is harder to breathe than usual.  Your breathing is faster than usual.  You have trouble sleeping.  You need to use your medicines more often than usual.  You have trouble doing your normal activities such as getting dressed or walking around the house. Get help right away if:  You have shortness of breath while resting.  You have shortness of breath that stops you from: ? Being able to talk. ? Doing normal activities.  Your chest hurts for longer than 5 minutes.  Your skin color is more blue than usual.  Your pulse oximeter shows that you have low oxygen for longer than 5 minutes.  You have a fever.  You feel too tired to breathe normally. These symptoms may represent a serious problem that is an emergency. Do not wait to see if the symptoms will go away. Get medical help right away. Call your local emergency services (911 in the [place] Do not drive yourself to the hospital. Summary  Chronic obstructive pulmonary disease (COPD) is a long-term lung problem.  The way your lungs work will never return to normal. Usually the condition gets worse over time. There are things you can do to keep yourself as healthy as possible.  Take over-the-counter and prescription medicines only as told by your doctor.  If you smoke, stop. Smoking makes the problem worse. This information is not intended to replace advice given to you by your health care provider. Make sure you discuss any questions you have with your health care provider. Document Revised: 07/14/2020 Document Reviewed: 07/14/2020 Elsevier Patient Education  2021 [employer] Health Maintenance, Male Adopting a healthy lifestyle and getting preventive care are important in promoting health and  wellness. Ask your health care provider about:  The right schedule for you to have regular tests and exams.  Things you can do on your own to prevent diseases and keep yourself healthy. What should I know about diet, weight, and exercise? Eat a healthy diet  Eat a diet that includes plenty of vegetables, fruits, low-fat dairy products, and lean protein.  Do not eat a lot of foods that are high in solid fats, added sugars, or sodium.   Maintain a healthy weight Body mass index (BMI) is a measurement that can be used to identify possible weight problems. It estimates body fat based on height and weight. Your health care provider can help determine your BMI and help you achieve or maintain a healthy weight. Get regular exercise Get regular exercise. This is one of the most important things you can do for your health. Most adults should:  Exercise for at least 150 minutes each week. The exercise should increase your heart rate and make you sweat (moderate-intensity exercise).  Do strengthening exercises at least twice  a week. This is in addition to the moderate-intensity exercise.  Spend less time sitting. Even light physical activity can be beneficial. Watch cholesterol and blood lipids Have your blood tested for lipids and cholesterol at 65 years of age, then have this test every 5 years. You may need to have your cholesterol levels checked more often if:  Your lipid or cholesterol levels are high.  You are older than 65 years of age.  You are at high risk for heart disease. What should I know about cancer screening? Many types of cancers can be detected early and may often be prevented. Depending on your health history and family history, you may need to have cancer screening at various ages. This may include screening for:  Colorectal cancer.  Prostate cancer.  Skin cancer.  Lung cancer. What should I know about heart disease, diabetes, and high blood pressure? Blood pressure  and heart disease  High blood pressure causes heart disease and increases the risk of stroke. This is more likely to develop in people who have high blood pressure readings, are of African descent, or are overweight.  Talk with your health care provider about your target blood pressure readings.  Have your blood pressure checked: ? Every 3-5 years if you are 3518-65 years of age. ? Every year if you are 65 years old or older.  If you are between the ages of 1865 and 1475 and are a current or former smoker, ask your health care provider if you should have a one-time screening for abdominal aortic aneurysm (AAA). Diabetes Have regular diabetes screenings. This checks your fasting blood sugar level. Have the screening done:  Once every three years after age 65 if you are at a normal weight and have a low risk for diabetes.  More often and at a younger age if you are overweight or have a high risk for diabetes. What should I know about preventing infection? Hepatitis B If you have a higher risk for hepatitis B, you should be screened for this virus. Talk with your health care provider to find out if you are at risk for hepatitis B infection. Hepatitis C Blood testing is recommended for:  Everyone born from [id number] through 1965.  Anyone with known risk factors for hepatitis C. Sexually transmitted infections (STIs)  You should be screened each year for STIs, including gonorrhea and chlamydia, if: ? You are sexually active and are younger than 65 years of age. ? You are older than 65 years of age and your health care provider tells you that you are at risk for this type of infection. ? Your sexual activity has changed since you were last screened, and you are at increased risk for chlamydia or gonorrhea. Ask your health care provider if you are at risk.  Ask your health care provider about whether you are at high risk for HIV. Your health care provider may recommend a prescription medicine to help  prevent HIV infection. If you choose to take medicine to prevent HIV, you should first get tested for HIV. You should then be tested every 3 months for as long as you are taking the medicine. Follow these instructions at home: Lifestyle  Do not use any products that contain nicotine or tobacco, such as cigarettes, e-cigarettes, and chewing tobacco. If you need help quitting, ask your health care provider.  Do not use street drugs.  Do not share needles.  Ask your health care provider for help if you need support or  information about quitting drugs. Alcohol use  Do not drink alcohol if your health care provider tells you not to drink.  If you drink alcohol: ? Limit how much you have to 0-2 drinks a day. ? Be aware of how much alcohol is in your drink. In the [place] one drink equals one 12 oz bottle of beer (355 mL), one 5 oz glass of wine (148 mL), or one 1 oz glass of hard liquor (44 mL). General instructions  Schedule regular health, dental, and eye exams.  Stay current with your vaccines.  Tell your health care provider if: ? You often feel depressed. ? You have ever been abused or do not feel safe at home. Summary  Adopting a healthy lifestyle and getting preventive care are important in promoting health and wellness.  Follow your health care provider's instructions about healthy diet, exercising, and getting tested or screened for diseases.  Follow your health care provider's instructions on monitoring your cholesterol and blood pressure. This information is not intended to replace advice given to you by your health care provider. Make sure you discuss any questions you have with your health care provider. Document Revised: 08/29/2018 Document Reviewed: 08/29/2018 Elsevier Patient Education  2021 [employer]   Psyllium granules or powder for solution What is this medicine? PSYLLIUM (SIL i yum) is a bulk-forming fiber laxative. This medicine is used to treat  constipation. Increasing fiber in the diet may also help lower cholesterol and promote heart health for some people. This medicine may be used for other purposes; ask your health care provider or pharmacist if you have questions. COMMON BRAND NAME(S): Fiber Therapy, GenFiber, Geri-Mucil, Hydrocil, Konsyl, Metamucil, Metamucil MultiHealth, Mucilin, Natural Fiber Therapy, Reguloid What should I tell my health care provider before I take this medicine? They need to know if you have any of these conditions:  blockage in your bowel  difficulty swallowing  inflammatory bowel disease  phenylketonuria  stomach or intestine problems  sudden change in bowel habits lasting more than 2 weeks  an unusual or allergic reaction to psyllium, other medicines, dyes, or preservatives  pregnant or trying or get pregnant  breast-feeding How should I use this medicine? Mix this medicine into a full glass (240 mL) of water or other cool drink. Take this medicine by mouth. Follow the directions on the package labeling, or take as directed by your health care professional. Take your medicine at regular intervals. Do not take your medicine more often than directed. Talk to your pediatrician regarding the use of this medicine in children. While this drug may be prescribed for children as young as 53 years old for selected conditions, precautions do apply. Overdosage: If you think you have taken too much of this medicine contact a poison control center or emergency room at once. NOTE: This medicine is only for you. Do not share this medicine with others. What if I miss a dose? If you miss a dose, take it as soon as you can. If it is almost time for your next dose, take only that dose. Do not take double or extra doses. What may interact with this medicine? Interactions are not expected. Take this product at least 2 hours before or after other medicines. This list may not describe all possible interactions. Give  your health care provider a list of all the medicines, herbs, non-prescription drugs, or dietary supplements you use. Also tell them if you smoke, drink alcohol, or use illegal drugs. Some items may interact with  your medicine. What should I watch for while using this medicine? Check with your doctor or health care professional if your symptoms do not start to get better or if they get worse. Stop using this medicine and contact your doctor or health care professional if you have rectal bleeding or if you have to treat your constipation for more than 1 week. These could be signs of a more serious condition. Drink several glasses of water a day while you are taking this medicine. This will help to relieve constipation and prevent dehydration. What side effects may I notice from receiving this medicine? Side effects that you should report to your doctor or health care professional as soon as possible:  allergic reactions like skin rash, itching or hives, swelling of the face, lips, or tongue  breathing problems  chest pain  nausea, vomiting  rectal bleeding  trouble swallowing Side effects that usually do not require medical attention (report to your doctor or health care professional if they continue or are bothersome):  bloating  gas  stomach cramps This list may not describe all possible side effects. Call your doctor for medical advice about side effects. You may report side effects to FDA at 1-800-FDA-1088. Where should I keep my medicine? Keep out of the reach of children. Store at room temperature between 15 and 30 degrees C (59 and 86 degrees F). Protect from moisture. Throw away any unused medicine after the expiration date. NOTE: This sheet is a summary. It may not cover all possible information. If you have questions about this medicine, talk to your doctor, pharmacist, or health care provider.  2021 Elsevier/[employer] Standard (2018-01-30 15:41:08)

## 2021-01-27 NOTE — Progress Notes (Signed)
New Patient Office Visit  Subjective:  Patient ID: [name], male    DOB: Aug 27, 1956  Age: 65 y.o. MRN: [id number]  CC:  Chief Complaint  Patient presents with  . Transitions Of Care    Pt would like to discuss COPD. Is being treated for Foot Pain, Constipation, and Sleep Apnea  . Constipation    HPI Juanangel W Hai Brewer presents for has chronic pain in his feet and was given gabapentin and caused constipation he saw gastroenterology and he was started on Lyrica and stopped gabapentin. He was started on Linzess by gastroenterology seems to be helping.    Still needs CPAP waiting on it.   Sees Dr. [facility], quit smoking 10 - 12 years ago. Still has chronic shortness of breath but reports it is stable with medications. Denies any recent infections.   Do see that he has arteriosclerosis on CT in past, may benefit from coronary calcium scoring, we discussed he will have his lung CT  This month and will wait until after that.   Patient  denies any fever, body aches,chills, rash, chest pain,nausea, vomiting, or diarrhea.  Denies dizziness, lightheadedness, pre syncopal or syncopal episodes.   Past Medical History:  Diagnosis Date  . COPD (chronic obstructive pulmonary disease) (HCC)   . GERD (gastroesophageal reflux disease)   . History of hepatitis C    treated several years ago through [place]  . Hypertension     Past Surgical History:  Procedure Laterality Date  . CATARACT EXTRACTION, BILATERAL    . EYE SURGERY Bilateral   . ORTHOPEDIC SURGERY Bilateral    pt states fracture repairs to arms and legs.  Marland Kitchen REVERSE SHOULDER ARTHROPLASTY Left 09/23/2019   Procedure: REVERSE SHOULDER ARTHROPLASTY;  Surgeon: Jones Broom, MD;  Location: [facility];  Service: Orthopedics;  Laterality: Left;    Family History  Problem Relation Age of Onset  . Heart disease Mother   . Hypertension Father     Social History   Socioeconomic History  . Marital status: Single    Spouse  name: Not on file  . Number of children: 2  . Years of education: Not on file  . Highest education level: Not on file  Occupational History  . Occupation: Holiday representative  Tobacco Use  . Smoking status: Former Smoker    Packs/day: 1.00    Years: 35.00    Pack years: 35.00    Types: Cigarettes    Quit date: 10/01/2010    Years since quitting: 10.3  . Smokeless tobacco: Never Used  Vaping Use  . Vaping Use: Never used  Substance and Sexual Activity  . Alcohol use: Yes    Alcohol/week: 42.0 standard drinks    Types: 42 Cans of beer per week    Comment: daily - 6 drinks a day  . Drug use: Yes    Types: Marijuana    Comment: last marijuana 2-3 weeks ago  . Sexual activity: Not Currently  Other Topics Concern  . Not on file  Social History Narrative  . Not on file   Social Determinants of Health   Financial Resource Strain: Not on file  Food Insecurity: Not on file  Transportation Needs: Not on file  Physical Activity: Not on file  Stress: Not on file  Social Connections: Not on file  Intimate Partner Violence: Not on file    ROS Review of Systems  Constitutional: Negative.   HENT: Negative.   Eyes: Negative.   Respiratory: Positive  for shortness of breath. Negative for apnea, cough, choking, chest tightness, wheezing and stridor.   Cardiovascular: Negative.  Negative for chest pain, palpitations and leg swelling.  Gastrointestinal: Positive for constipation. Negative for abdominal distention, abdominal pain, anal bleeding, blood in stool, diarrhea, nausea, rectal pain and vomiting.  Genitourinary: Negative.   Musculoskeletal: Positive for arthralgias. Negative for back pain, gait problem, joint swelling, myalgias, neck pain and neck stiffness.  Neurological: Negative.   Psychiatric/Behavioral: Negative.     Objective:   Today's Vitals: BP (!) 144/82 (BP Location: Left Arm, Patient Position: Sitting)   Pulse 82   Temp 97.7 F (36.5 C)   Ht 5' 11.46" (1.815 m)   Wt  229 lb 6.4 oz (104.1 kg)   SpO2 96%   BMI 31.59 kg/m   Physical Exam Vitals and nursing note reviewed.  Constitutional:      General: He is not in acute distress.    Appearance: Normal appearance. He is well-developed. He is obese. He is not ill-appearing, toxic-appearing or diaphoretic.     Comments: Patient is alert and oriented and responsive to questions Engages in eye contact with provider. Speaks in full sentences without any pauses without any shortness of breath or distress.    HENT:     Head: Normocephalic and atraumatic.     Right Ear: Hearing, tympanic membrane, ear canal and external ear normal. There is no impacted cerumen.     Left Ear: Hearing, tympanic membrane, ear canal and external ear normal. There is no impacted cerumen.     Nose: Nose normal. No congestion or rhinorrhea.     Mouth/Throat:     Mouth: Mucous membranes are moist.     Pharynx: Oropharynx is clear. Uvula midline. No oropharyngeal exudate or posterior oropharyngeal erythema.  Eyes:     General: Lids are normal. No scleral icterus.       Right eye: No discharge.        Left eye: No discharge.     Extraocular Movements: Extraocular movements intact.     Conjunctiva/sclera: Conjunctivae normal.     Pupils: Pupils are equal, round, and reactive to light.  Neck:     Thyroid: No thyromegaly.     Vascular: Normal carotid pulses. No carotid bruit, hepatojugular reflux or JVD.     Trachea: Trachea and phonation normal. No tracheal tenderness or tracheal deviation.     Meningeal: Brudzinski's sign absent.  Cardiovascular:     Rate and Rhythm: Normal rate and regular rhythm.     Pulses: Normal pulses.     Heart sounds: Normal heart sounds, S1 normal and S2 normal. Heart sounds not distant. No murmur heard. No friction rub. No gallop.   Pulmonary:     Effort: Pulmonary effort is normal. No accessory muscle usage or respiratory distress.     Breath sounds: Normal breath sounds. No stridor. No wheezing or  rales.  Chest:     Chest wall: No tenderness.  Abdominal:     General: Bowel sounds are normal. There is no distension.     Palpations: Abdomen is soft. There is no mass.     Tenderness: There is no abdominal tenderness. There is no right CVA tenderness, left CVA tenderness, guarding or rebound.     Hernia: No hernia is present.     Comments: Round  Genitourinary:    Comments: Deferred.  Musculoskeletal:        General: No tenderness or deformity. Normal range of motion.  Cervical back: Full passive range of motion without pain, normal range of motion and neck supple.     Comments: Patient moves on and off of exam table and in room without difficulty. Gait is normal in hall and in room. Patient is oriented to person place time and situation. Patient answers questions appropriately and engages in conversation.   Lymphadenopathy:     Head:     Right side of head: No submental, submandibular, tonsillar, preauricular, posterior auricular or occipital adenopathy.     Left side of head: No submental, submandibular, tonsillar, preauricular, posterior auricular or occipital adenopathy.     Cervical: No cervical adenopathy.  Skin:    General: Skin is warm and dry.     Capillary Refill: Capillary refill takes less than 2 seconds.     Coloration: Skin is not pale.     Findings: No erythema or rash.     Nails: There is no clubbing.  Neurological:     Mental Status: He is alert and oriented to person, place, and time.     GCS: GCS eye subscore is 4. GCS verbal subscore is 5. GCS motor subscore is 6.     Cranial Nerves: No cranial nerve deficit.     Sensory: No sensory deficit.     Motor: No weakness or abnormal muscle tone.     Coordination: Coordination normal.     Gait: Gait normal.     Deep Tendon Reflexes: Reflexes are normal and symmetric. Reflexes normal.  Psychiatric:        Mood and Affect: Mood normal.        Speech: Speech normal.        Behavior: Behavior normal.         Thought Content: Thought content normal.        Judgment: Judgment normal.     Assessment & Plan:   Problem List Items Addressed This Visit      Cardiovascular and Mediastinum   Essential hypertension   Relevant Orders   CBC with Differential/Platelet   Comprehensive metabolic panel   TSH   Arteriosclerosis     Respiratory   COPD mixed type (HCC) - Primary   Relevant Orders   B Nat Peptide     Other   Screening for prostate cancer   Elevated LFTs   Relevant Orders   Lipid panel   PSA   Former smoker      1. COPD mixed type (HCC) Continue to see Dr. Maple [facility].  - B Nat Peptide; Future- will check denies edema.   2. Elevated LFTs  - Lipid panel; Future   3. Essential hypertension Elevated blood pressure today, usually controlled per patient will check again at next visit. Monitor at home if able.  - CBC with Differential/Platelet; Future - Comprehensive metabolic panel; Future - TSH; Future  4. Screening for prostate cancer  PSA   5. Arteriosclerosis- on CT scan. Consider Coronary Calcium scoring and cardiology consult.   6. Former smoker Continues to not smoke.    Outpatient Encounter Medications as of 01/27/2021  Medication Sig  . acetaminophen (TYLENOL) 500 MG tablet Take 500-1,000 mg by mouth every 6 (six) hours as needed (for pain.).  Marland Kitchen. Aflibercept 2 MG/0.05ML SOLN Inject 1 Dose into the eye every 8 (eight) weeks.   Marland Kitchen. aspirin EC 81 MG tablet Take 81 mg by mouth daily.  . diclofenac (VOLTAREN) 75 MG EC tablet TAKE 1 TABLET BY MOUTH TWICE A DAY  . Fluticasone-Umeclidin-Vilant (TRELEGY ELLIPTA)  100-62.5-25 MCG/INH AEPB Inhale 1 puff into the lungs daily.  Marland Kitchen ipratropium-albuterol (DUONEB) 0.5-2.5 (3) MG/3ML SOLN INHALE THE CONTENTS OF 1 VIAL (3 ML) VIANEBULIZER EVERY 6 HOURS AS NEEDED  . ketoconazole (NIZORAL) 2 % cream SMARTSIG:1 Topical Every Night  . ketoconazole (NIZORAL) 2 % shampoo Apply topically.  Marland Kitchen LINZESS 290 MCG CAPS capsule Take 290 mcg by  mouth daily.  Marland Kitchen lisinopril (ZESTRIL) 20 MG tablet TAKE 1 TABLET BY MOUTH ONCE A DAY  . Multiple Vitamin (MULTIVITAMIN WITH MINERALS) TABS tablet Take 1 tablet by mouth daily.  . pantoprazole (PROTONIX) 20 MG tablet Take 1 tablet (20 mg total) by mouth daily.  . pravastatin (PRAVACHOL) 40 MG tablet Take 1 tablet (40 mg total) by mouth daily.  . pregabalin (LYRICA) 100 MG capsule Take 1 capsule (100 mg total) by mouth 2 (two) times daily.  . pregabalin (LYRICA) 75 MG capsule Take 1 capsule (75 mg total) by mouth 2 (two) times daily.  Marland Kitchen PROAIR HFA 108 (90 Base) MCG/ACT inhaler INHALE 2 PUFFS INTO THE LUNGS EVERY 6 HOURS AS NEEDED FOR WHEEZING ORSHORTNESS OF BREATH.  . Budeson-Glycopyrrol-Formoterol (BREZTRI AEROSPHERE) 160-9-4.8 MCG/ACT AERO Inhale 2 puffs into the lungs 2 (two) times daily. (Patient not taking: Reported on 01/27/2021)   No facility-administered encounter medications on file as of 01/27/2021.    Return precautions given. Red Flags discussed. The patient was given clear instructions to go to ER or return to medical center if any red flags develop, symptoms do not improve, worsen or new problems develop. They verbalized understanding.    Risks, benefits, and alternatives of the medications and treatment plan prescribed today were discussed, and patient expressed understanding.    Education regarding symptom management and diagnosis given to patient on AVS.  Patient was in agreement with treatment plan.   Continue to follow with  Eula Fried. Susan Bleich AGNP-C, FNP-C for routine health maintenance.   Eula Fried. Emelyn Roen AGNP-C, FNP-C Red Flags discussed. The patient was given clear instructions to go to ER or return to medical center if any red flags develop, symptoms do not improve, worsen or new problems develop. They verbalized understanding.   Follow-up: Return in about 3 months (around 04/29/2021), or if symptoms worsen or fail to improve, for at any time for any worsening  symptoms, Go to Emergency room/ urgent care if worse.   Jairo Ben, FNP

## 2021-01-29 ENCOUNTER — Other Ambulatory Visit (INDEPENDENT_AMBULATORY_CARE_PROVIDER_SITE_OTHER): Payer: 59

## 2021-01-29 ENCOUNTER — Other Ambulatory Visit: Payer: Self-pay

## 2021-01-29 DIAGNOSIS — I10 Essential (primary) hypertension: Secondary | ICD-10-CM | POA: Diagnosis not present

## 2021-01-29 DIAGNOSIS — R7989 Other specified abnormal findings of blood chemistry: Secondary | ICD-10-CM

## 2021-01-29 DIAGNOSIS — J449 Chronic obstructive pulmonary disease, unspecified: Secondary | ICD-10-CM

## 2021-01-29 LAB — LIPID PANEL
Cholesterol: 179 mg/dL (ref 0–200)
HDL: 56.3 mg/dL (ref >39.00)
NonHDL: 122.77
Total CHOL/HDL Ratio: 3
Triglycerides: 202 mg/dL — ABNORMAL HIGH (ref 0.0–149.0)
VLDL: 40.4 mg/dL — ABNORMAL HIGH (ref 0.0–40.0)

## 2021-01-29 LAB — BRAIN NATRIURETIC PEPTIDE: Pro B Natriuretic peptide (BNP): 9 pg/mL (ref 0.0–100.0)

## 2021-01-29 LAB — CBC WITH DIFFERENTIAL/PLATELET
Basophils Absolute: 0 10*3/uL (ref 0.0–0.1)
Basophils Relative: 0.8 % (ref 0.0–3.0)
Eosinophils Absolute: 0.1 10*3/uL (ref 0.0–0.7)
Eosinophils Relative: 1.9 % (ref 0.0–5.0)
HCT: 42.2 % (ref 39.0–52.0)
Hemoglobin: 14.4 g/dL (ref 13.0–17.0)
Lymphocytes Relative: 25.5 % (ref 12.0–46.0)
Lymphs Abs: 1.4 10*3/uL (ref 0.7–4.0)
MCHC: 34.2 g/dL (ref 30.0–36.0)
MCV: 99.3 fl (ref 78.0–100.0)
Monocytes Absolute: 0.6 10*3/uL (ref 0.1–1.0)
Monocytes Relative: 10.6 % (ref 3.0–12.0)
Neutro Abs: 3.3 10*3/uL (ref 1.4–7.7)
Neutrophils Relative %: 61.2 % (ref 43.0–77.0)
Platelets: 164 10*3/uL (ref 150.0–400.0)
RBC: 4.25 Mil/uL (ref 4.22–5.81)
RDW: 13.1 % (ref 11.5–15.5)
WBC: 5.3 10*3/uL (ref 4.0–10.5)

## 2021-01-29 LAB — COMPREHENSIVE METABOLIC PANEL
ALT: 65 U/L — ABNORMAL HIGH (ref 0–53)
AST: 50 U/L — ABNORMAL HIGH (ref 0–37)
Albumin: 4.6 g/dL (ref 3.5–5.2)
Alkaline Phosphatase: 68 U/L (ref 39–117)
BUN: 14 mg/dL (ref 6–23)
CO2: 23 mEq/L (ref 19–32)
Calcium: 9.1 mg/dL (ref 8.4–10.5)
Chloride: 104 mEq/L (ref 96–112)
Creatinine, Ser: 0.78 mg/dL (ref 0.40–1.50)
GFR: 94.34 mL/min (ref >60.00)
Glucose, Bld: 93 mg/dL (ref 70–99)
Potassium: 4.3 mEq/L (ref 3.5–5.1)
Sodium: 138 mEq/L (ref 135–145)
Total Bilirubin: 0.5 mg/dL (ref 0.2–1.2)
Total Protein: 7.4 g/dL (ref 6.0–8.3)

## 2021-01-29 LAB — TSH: TSH: 4.4 u[IU]/mL (ref 0.35–4.50)

## 2021-01-29 LAB — LDL CHOLESTEROL, DIRECT: Direct LDL: 106 mg/dL

## 2021-01-29 LAB — PSA: PSA: 0.25 ng/mL (ref 0.10–4.00)

## 2021-02-01 ENCOUNTER — Other Ambulatory Visit: Payer: Self-pay | Admitting: Internal Medicine

## 2021-02-01 NOTE — Progress Notes (Signed)
Triglycerides are elevated.  Discuss lifestyle modification with patient e.g. increase exercise, fiber, fruits, vegetables, lean meat, and omega 3/fish intake and decrease saturated fat.  If patient following strict diet and exercise program already please schedule follow up appointment with primary care physician.   AST and ALT liver enzymes elevated avoid alcohol and tylenol.  CBC within normal limits.  BNP is within normal limits TSH for thyroid within normal limits.  PSA for prostate within normal limits.  Add on acute hepatitis panel and GGT to labs if able.  Recheck lipid panel, CMP in 3 months.

## 2021-02-02 ENCOUNTER — Telehealth: Payer: Self-pay

## 2021-02-02 DIAGNOSIS — E785 Hyperlipidemia, unspecified: Secondary | ICD-10-CM

## 2021-02-02 NOTE — Telephone Encounter (Signed)
Lipid panel and CMP have been ordered for future labs.

## 2021-02-03 ENCOUNTER — Ambulatory Visit (INDEPENDENT_AMBULATORY_CARE_PROVIDER_SITE_OTHER): Payer: 59 | Admitting: Acute Care

## 2021-02-03 ENCOUNTER — Ambulatory Visit
Admission: RE | Admit: 2021-02-03 | Discharge: 2021-02-03 | Disposition: A | Discharge status: Home / Self Care | Payer: 59 | Source: Ambulatory Visit | Attending: Acute Care | Admitting: Acute Care

## 2021-02-03 ENCOUNTER — Other Ambulatory Visit: Payer: Self-pay

## 2021-02-03 ENCOUNTER — Encounter: Payer: Self-pay | Admitting: Acute Care

## 2021-02-03 VITALS — BP 128/84 | HR 98 | Temp 97.3°F | Ht 74.0 in | Wt 228.8 lb

## 2021-02-03 DIAGNOSIS — Z87891 Personal history of nicotine dependence: Secondary | ICD-10-CM

## 2021-02-03 MED ORDER — IPRATROPIUM-ALBUTEROL 0.5-2.5 (3) MG/3ML IN SOLN: RESPIRATORY_TRACT | 9 refills | Status: DC

## 2021-02-03 NOTE — Patient Instructions (Signed)
Thank you for participating in the [facility]. It was our pleasure to meet you today. We will call you with the results of your scan within the next few days. Your scan will be assigned a Lung RADS category score by the physicians reading the scans.  This Lung RADS score determines follow up scanning.  See below for description of categories, and follow up screening recommendations. We will be in touch to schedule your follow up screening annually or based on recommendations of our providers. We will fax a copy of your scan results to your Primary Care Physician, or the physician who referred you to the program, to ensure they have the results. Please call the office if you have any questions or concerns regarding your scanning experience or results.  Our office number is [phone]. Please speak with Denise Phelps, RN. She is our Lung Cancer Screening RN. If she is unavailable when you call, please have the office staff send her a message. She will return your call at her earliest convenience. Remember, if your scan is normal, we will scan you annually as long as you continue to meet the criteria for the program. (Age 55-77, Current smoker or smoker who has quit within the last 15 years). If you are a smoker, remember, quitting is the single most powerful action that you can take to decrease your risk of lung cancer and other pulmonary, breathing related problems. We know quitting is hard, and we are here to help.  Please let us know if there is anything we can do to help you meet your goal of quitting. If you are a former smoker, congratulations. We are proud of you! Remain smoke free! Remember you can refer friends or family members through the number above.  We will screen them to make sure they meet criteria for the program. Thank you for helping us take better care of you by participating in Lung Screening.  Lung RADS Categories:  Lung RADS 1: no nodules  or definitely non-concerning nodules.  Recommendation is for a repeat annual scan in 12 months.  Lung RADS 2:  nodules that are non-concerning in appearance and behavior with a very low likelihood of becoming an active cancer. Recommendation is for a repeat annual scan in 12 months.  Lung RADS 3: nodules that are probably non-concerning , includes nodules with a low likelihood of becoming an active cancer.  Recommendation is for a 6-month repeat screening scan. Often noted after an upper respiratory illness. We will be in touch to make sure you have no questions, and to schedule your 6-month scan.  Lung RADS 4 A: nodules with concerning findings, recommendation is most often for a follow up scan in 3 months or additional testing based on our provider's assessment of the scan. We will be in touch to make sure you have no questions and to schedule the recommended 3 month follow up scan.  Lung RADS 4 B:  indicates findings that are concerning. We will be in touch with you to schedule additional diagnostic testing based on our provider's  assessment of the scan.   

## 2021-02-03 NOTE — Progress Notes (Addendum)
Shared Decision Making Visit Lung Cancer Screening Program [phone])   Eligibility:  Age 65 y.o.  Pack Years Smoking History Calculation 40 pack year smoking history (# packs/per year x # years smoked)  Recent History of coughing up blood  no  Unexplained weight loss? no ( >Than 15 pounds within the last 6 months )  Prior History Lung / other cancer no (Diagnosis within the last 5 years already requiring surveillance chest CT Scans).  Smoking Status Former Smoker  Former Smokers: Years since quit: 10 years  Quit Date: 2012  Visit Components:  Discussion included one or more decision making aids. yes  Discussion included risk/benefits of screening. yes  Discussion included potential follow up diagnostic testing for abnormal scans. yes  Discussion included meaning and risk of over diagnosis. yes  Discussion included meaning and risk of False Positives. yes  Discussion included meaning of total radiation exposure. yes  Counseling Included:  Importance of adherence to annual lung cancer LDCT screening. yes  Impact of comorbidities on ability to participate in the program. yes  Ability and willingness to under diagnostic treatment. yes  Smoking Cessation Counseling:  Current Smokers:   Discussed importance of smoking cessation. yes  Information about tobacco cessation classes and interventions provided to patient. yes  Patient provided with "ticket" for LDCT Scan. yes  Symptomatic Patient. no  Counseling  Diagnosis Code: Tobacco Use Z72.0  Asymptomatic Patient yes  Counseling (Intermediate counseling: > three minutes counseling) K8127  Former Smokers:   Discussed the importance of maintaining cigarette abstinence. yes  Diagnosis Code: Personal History of Nicotine Dependence. N17.001  Information about tobacco cessation classes and interventions provided to patient. Yes  Patient provided with "ticket" for LDCT Scan. yes  Written Order for Lung Cancer  Screening with LDCT placed in Epic. Yes (CT Chest Lung Cancer Screening Low Dose W/O CM) [id number] Z12.2-Screening of respiratory organs Z87.891-Personal history of nicotine dependence  I spent 25 minutes of face to face time with Mr. Tuberville discussing the risks and benefits of lung cancer screening. We viewed a power point together that explained in detail the above noted topics. We took the time to pause the power point at intervals to allow for questions to be asked and answered to ensure understanding. We discussed that he had taken the single most powerful action possible to decrease his risk of developing lung cancer when he quit smoking. I counseled him to remain smoke free, and to contact me if he ever had the desire to smoke again so that I can provide resources and tools to help support the effort to remain smoke free. We discussed the time and location of the scan, and that either  Abigail Miyamoto RN or I will call with the results within  24-48 hours of receiving them. He has my card and contact information in the event he needs to speak with me, in addition to a copy of the power point we reviewed as a resource. He verbalized understanding of all of the above and had no further questions upon leaving the office.     I explained to the patient that there has been a high incidence of coronary artery disease noted on these exams. I explained that this is a non-gated exam therefore degree or severity cannot be determined. This patient is currently on statin therapy. I have asked the patient to follow-up with their PCP regarding any incidental finding of coronary artery disease and management with diet or medication as they feel is clinically  indicated. The patient verbalized understanding of the above and had no further questions.   I explained to the patient that there has been a high incidence of coronary artery disease noted on these exams. I explained that this is a non-gated exam therefore degree  or severity cannot be determined. This patient is on statin therapy. I have asked the patient to follow-up with their PCP regarding any incidental finding of coronary artery disease and management with diet or medication as their PCP  feels is clinically indicated. The patient verbalized understanding of the above and had no further questions upon completion of the visit.       Bevelyn Ngo, NP 02/03/2021

## 2021-02-05 NOTE — Progress Notes (Signed)
Please call patient and let them  know their  low dose Ct was read as a Lung RADS 1, negative study: no nodules or definitely benign nodules. Radiology recommendation is for a repeat LDCT in 12 months. .Please let them  know we will order and schedule their  annual screening scan for 01/2022. Please let them  know there was notation of CAD on their  scan.  Please remind the patient  that this is a non-gated exam therefore degree or severity of disease  cannot be determined. Please have them  follow up with their PCP regarding potential risk factor modification, dietary therapy or pharmacologic therapy if clinically indicated. Pt.  is currently on statin therapy. Please place order for annual  screening scan for  01/2022 and fax results to PCP. Thanks so much. 

## 2021-02-05 NOTE — Progress Notes (Signed)
Please let patient know there was notation of Hepatic steatosis. This  is a term that describes the build up of fat in the liver. It is normal to have small amounts of fat in your liver, but when the proportion of liver cells that contain fat exceeds more than 5% it is indicative of early stage fatty liver.Treatment often involves reducing risk factors through a diet and exercise plan. It is generally a benign condition, but in a small percentage of patients it does require follow up. Please have the patient follow up with PCP regarding potential risk factor modification, dietary therapy or pharmacologic therapy if clinically indicated. Thanks so much   

## 2021-02-09 ENCOUNTER — Encounter (INDEPENDENT_AMBULATORY_CARE_PROVIDER_SITE_OTHER): Payer: 59 | Admitting: Ophthalmology

## 2021-02-12 ENCOUNTER — Telehealth: Payer: Self-pay | Admitting: Internal Medicine

## 2021-02-12 DIAGNOSIS — Z87891 Personal history of nicotine dependence: Secondary | ICD-10-CM

## 2021-02-16 NOTE — Telephone Encounter (Signed)
Attempted to call pt but unable to leave message. Will call back.

## 2021-02-17 ENCOUNTER — Telehealth: Payer: Self-pay | Admitting: Internal Medicine

## 2021-02-17 MED ORDER — PREDNISONE 10 MG PO TABS: 10.0000 mg | ORAL_TABLET | Freq: Every day | ORAL | 0 refills | Status: DC

## 2021-02-17 NOTE — Telephone Encounter (Signed)
Prednisone 10 mg, # 20, 4 X 2 DAYS, 3 X 2 DAYS, 2 X 2 DAYS, 1 X 2 DAYS Thanks

## 2021-02-17 NOTE — Telephone Encounter (Signed)
Spoke with the pt  He is c/o constant cough- white, bubbly sputum and increased DOE x 3 days  He states also has minimal wheezing  No chest tightness, f/c/s, aches  He is taking his trelegy daily  Using albuterol if needed and this helps some  He is wondering if he needs some prednisone  He has not taken a covid test and states he has had 4 covid vax Please advise, thanks!  No Known Allergies  Current Outpatient Medications on File Prior to Visit  Medication Sig Dispense Refill  . acetaminophen (TYLENOL) 500 MG tablet Take 500-1,000 mg by mouth every 6 (six) hours as needed (for pain.).    Marland Kitchen Aflibercept 2 MG/0.05ML SOLN Inject 1 Dose into the eye every 8 (eight) weeks.     Marland Kitchen aspirin EC 81 MG tablet Take 81 mg by mouth daily.    . diclofenac (VOLTAREN) 75 MG EC tablet TAKE 1 TABLET BY MOUTH TWICE A DAY 50 tablet 2  . Fluticasone-Umeclidin-Vilant (TRELEGY ELLIPTA) 100-62.5-25 MCG/INH AEPB Inhale 1 puff into the lungs daily. 60 each 12  . ipratropium-albuterol (DUONEB) 0.5-2.5 (3) MG/3ML SOLN INHALE THE CONTENTS OF 1 VIAL VIA NEBULIZER EVERY 6 HOURS AS NEEDED 180 mL 9  . ketoconazole (NIZORAL) 2 % cream SMARTSIG:1 Topical Every Night    . ketoconazole (NIZORAL) 2 % shampoo Apply topically.    Marland Kitchen LINZESS 290 MCG CAPS capsule Take 290 mcg by mouth daily.    Marland Kitchen lisinopril (ZESTRIL) 20 MG tablet TAKE 1 TABLET BY MOUTH ONCE A DAY 90 tablet 3  . Multiple Vitamin (MULTIVITAMIN WITH MINERALS) TABS tablet Take 1 tablet by mouth daily.    . pantoprazole (PROTONIX) 20 MG tablet Take 1 tablet (20 mg total) by mouth daily. 90 tablet 1  . pravastatin (PRAVACHOL) 40 MG tablet Take 1 tablet (40 mg total) by mouth daily. 90 tablet 3  . pregabalin (LYRICA) 100 MG capsule Take 1 capsule (100 mg total) by mouth 2 (two) times daily. 60 capsule 3  . pregabalin (LYRICA) 75 MG capsule Take 1 capsule (75 mg total) by mouth 2 (two) times daily. 60 capsule 5  . PROAIR HFA 108 (90 Base) MCG/ACT inhaler INHALE 2 PUFFS  INTO THE LUNGS EVERY 6 HOURS AS NEEDED FOR WHEEZING ORSHORTNESS OF BREATH. 18 g 12   No current facility-administered medications on file prior to visit.

## 2021-02-17 NOTE — Telephone Encounter (Signed)
Spoke with the pt and notified of recs per CDY  He verbalized understanding  Rx was sent to pharm  

## 2021-02-18 ENCOUNTER — Encounter: Payer: Self-pay | Admitting: *Deleted

## 2021-02-18 NOTE — Telephone Encounter (Signed)
CT screening for lung cancer results as follows: needs repeat annual screening CT low dose chest, does show emphysema, he is followed by Dr. Maple Hudson in Pulmonary. Fatty liver is seen on CT. Also shows coronary artery plaque, definitely advise continuing statin, and aggressive lifestyle and dietary changes, at follow up we can consider Coronary Artery Calcium scoring scan.   IMPRESSION: 1. Lung-RADS 1, negative. Continue annual screening with low-dose chest CT without contrast in 12 months. 2. Hepatic steatosis. 3. Coronary artery atherosclerosis. 4. Emphysema (ICD10-J43.9).  CC: pulmonary Dr. Maple Hudson.

## 2021-02-18 NOTE — Telephone Encounter (Signed)
Attempted to contact pt. Unable to leave message.  Letter mailed to pt with CT results per Kandice Robinsons, NP.    Copy of CT sent to PCP.  Order placed for 1 yr f/u CT.

## 2021-02-22 ENCOUNTER — Encounter (INDEPENDENT_AMBULATORY_CARE_PROVIDER_SITE_OTHER): Payer: 59 | Admitting: Ophthalmology

## 2021-02-24 ENCOUNTER — Other Ambulatory Visit: Payer: Self-pay

## 2021-02-24 ENCOUNTER — Encounter (INDEPENDENT_AMBULATORY_CARE_PROVIDER_SITE_OTHER): Payer: Self-pay | Admitting: Ophthalmology

## 2021-02-24 ENCOUNTER — Ambulatory Visit (INDEPENDENT_AMBULATORY_CARE_PROVIDER_SITE_OTHER): Payer: 59 | Admitting: Ophthalmology

## 2021-02-24 DIAGNOSIS — H353221 Exudative age-related macular degeneration, left eye, with active choroidal neovascularization: Secondary | ICD-10-CM | POA: Diagnosis not present

## 2021-02-24 DIAGNOSIS — H353211 Exudative age-related macular degeneration, right eye, with active choroidal neovascularization: Secondary | ICD-10-CM | POA: Diagnosis not present

## 2021-02-24 NOTE — Progress Notes (Signed)
02/24/2021     CHIEF COMPLAINT Patient presents for Retina Follow Up (11 Wk F/U OD, poss Eylea OD//Pt denies noticeable changes to [place] OU since last visit. Pt denies ocular pain, flashes of light, or floaters OU. //)   HISTORY OF PRESENT ILLNESS: [name] is a 65 y.o. male who presents to the clinic today for:   HPI    Retina Follow Up    Diagnosis: Wet AMD   Laterality: right eye   Onset: 11 weeks ago   Severity: mild   Duration: 11 weeks   Course: stable   Comments: 11 Wk F/U OD, poss Eylea OD  Pt denies noticeable changes to [place] OU since last visit. Pt denies ocular pain, flashes of light, or floaters OU.          Last edited by Ileana Roup, COA on 02/24/2021  8:00 AM. (History)      Referring physician: Berniece Pap, FNP [address] [facility] [address]  [place] [address]  HISTORICAL INFORMATION:   Selected notes from the MEDICAL RECORD NUMBER    Lab Results  Component Value Date   HGBA1C 5.4 08/31/2020     CURRENT MEDICATIONS: Current Outpatient Medications (Ophthalmic Drugs)  Medication Sig  . Aflibercept 2 MG/0.05ML SOLN Inject 1 Dose into the eye every 8 (eight) weeks.    No current facility-administered medications for this visit. (Ophthalmic Drugs)   Current Outpatient Medications (Other)  Medication Sig  . acetaminophen (TYLENOL) 500 MG tablet Take 500-1,000 mg by mouth every 6 (six) hours as needed (for pain.).  Marland Kitchen aspirin EC 81 MG tablet Take 81 mg by mouth daily.  . diclofenac (VOLTAREN) 75 MG EC tablet TAKE 1 TABLET BY MOUTH TWICE A DAY  . Fluticasone-Umeclidin-Vilant (TRELEGY ELLIPTA) 100-62.5-25 MCG/INH AEPB Inhale 1 puff into the lungs daily.  Marland Kitchen ipratropium-albuterol (DUONEB) 0.5-2.5 (3) MG/3ML SOLN INHALE THE CONTENTS OF 1 VIAL VIA NEBULIZER EVERY 6 HOURS AS NEEDED  . ketoconazole (NIZORAL) 2 % cream SMARTSIG:1 Topical Every Night  . ketoconazole (NIZORAL) 2 % shampoo Apply topically.  Marland Kitchen LINZESS 290 MCG CAPS capsule Take 290  mcg by mouth daily.  Marland Kitchen lisinopril (ZESTRIL) 20 MG tablet TAKE 1 TABLET BY MOUTH ONCE A DAY  . Multiple Vitamin (MULTIVITAMIN WITH MINERALS) TABS tablet Take 1 tablet by mouth daily.  . pantoprazole (PROTONIX) 20 MG tablet Take 1 tablet (20 mg total) by mouth daily.  . pravastatin (PRAVACHOL) 40 MG tablet Take 1 tablet (40 mg total) by mouth daily.  . predniSONE (DELTASONE) 10 MG tablet Take 1 tablet (10 mg total) by mouth daily with breakfast.  . pregabalin (LYRICA) 100 MG capsule Take 1 capsule (100 mg total) by mouth 2 (two) times daily.  . pregabalin (LYRICA) 75 MG capsule Take 1 capsule (75 mg total) by mouth 2 (two) times daily.  Marland Kitchen PROAIR HFA 108 (90 Base) MCG/ACT inhaler INHALE 2 PUFFS INTO THE LUNGS EVERY 6 HOURS AS NEEDED FOR WHEEZING ORSHORTNESS OF BREATH.   No current facility-administered medications for this visit. (Other)      REVIEW OF SYSTEMS:    ALLERGIES No Known Allergies  PAST MEDICAL HISTORY Past Medical History:  Diagnosis Date  . COPD (chronic obstructive pulmonary disease) (HCC)   . GERD (gastroesophageal reflux disease)   . History of hepatitis C    treated several years ago through [place]  . Hypertension    Past Surgical History:  Procedure Laterality Date  . CATARACT EXTRACTION, BILATERAL    .  EYE SURGERY Bilateral   . ORTHOPEDIC SURGERY Bilateral    pt states fracture repairs to arms and legs.  Marland Kitchen REVERSE SHOULDER ARTHROPLASTY Left 09/23/2019   Procedure: REVERSE SHOULDER ARTHROPLASTY;  Surgeon: Jones Broom, MD;  Location: [facility];  Service: Orthopedics;  Laterality: Left;    FAMILY HISTORY Family History  Problem Relation Age of Onset  . Heart disease Mother   . Hypertension Father     SOCIAL HISTORY Social History   Tobacco Use  . Smoking status: Former Smoker    Packs/day: 1.00    Years: 35.00    Pack years: 35.00    Types: Cigarettes    Quit date: 10/01/2010    Years since quitting: 10.4  . Smokeless tobacco: Never Used  Vaping  Use  . Vaping Use: Never used  Substance Use Topics  . Alcohol use: Yes    Alcohol/week: 42.0 standard drinks    Types: 42 Cans of beer per week    Comment: daily - 6 drinks a day  . Drug use: Yes    Types: Marijuana    Comment: last marijuana 2-3 weeks ago         OPHTHALMIC EXAM: Base Eye Exam    Visual Acuity (ETDRS)      Right Left   Dist [place] 20/25 -2 20/400   Dist ph [place]  20/100       Tonometry (Tonopen, 8:02 AM)      Right Left   Pressure 17 15       Pupils      Pupils Dark Light Shape React APD   Right PERRL 3 2 Round Brisk None   Left PERRL 3 2 Round Brisk None       Visual Fields (Counting fingers)      Left Right    Full Full       Extraocular Movement      Right Left    Full Full       Neuro/Psych    Oriented x3: Yes   Mood/Affect: Normal       Dilation    Right eye: 1.0% Mydriacyl, 2.5% Phenylephrine @ 8:02 AM        Slit Lamp and Fundus Exam    External Exam      Right Left   External Normal Normal       Slit Lamp Exam      Right Left   Lids/Lashes Normal Normal   Conjunctiva/Sclera White and quiet White and quiet   Cornea Clear Clear   Anterior Chamber Deep and quiet Deep and quiet   Iris Round and reactive Round and reactive   Lens Posterior chamber intraocular lens Posterior chamber intraocular lens   Anterior Vitreous Normal Normal       Fundus Exam      Right Left   Posterior Vitreous Vitrectomized    Disc Normal    C/D Ratio 0.3    Macula Retinal pigment epithelial mottling, no disciform scar, no macular thickening, no hemorrhage    Vessels Normal    Periphery Normal, Good laser retinopexy temporally and inferonasal from prior retinal detachment repair           IMAGING AND PROCEDURES  Imaging and Procedures for 02/24/21  OCT, Retina - OU - Both Eyes       Right Eye Quality was good. Scan locations included subfoveal. Central Foveal Thickness: 267. Progression has improved. Findings include abnormal foveal  contour.   Left Eye Quality was good.  Scan locations included subfoveal. Central Foveal Thickness: 281. Progression has improved. Findings include abnormal foveal contour.   Notes OD much less subretinal fluid in a 8189-month follow-up interval, 11 weeks 2 days, and we will thus observe. OS also with much less subretinal fluid, at 2089-month interval today OS much less, on no therapy will continue to observe   Each of these findings are coincident with with now excellent compliance with nightly oxygen "all night" since November-December 2021.  In previous months he would not replace it if it was displaced during the night  He has been using it nightly throughout the entire night for the past 6 to 7 months                ASSESSMENT/PLAN:  Exudative age-related macular degeneration of right eye with active choroidal neovascularization (HCC) No active recurrence no subretinal fluid at 11-week 2-day interval today.  We will follow-up in 4 weeks to monitor closely.  This stability and lack of recurrence is also coincident with continuous use of nightly oxygen for the last 7 months  Exudative age-related macular degeneration of left eye with active choroidal neovascularization (HCC) At 2789-month interval today much less subretinal fluid.  Will observe We will follow-up in 4 weeks to monitor closely.  This stability and lack of recurrence is also coincident with continuous use of nightly oxygen for the last 7 months       ICD-10-CM   1. Exudative age-related macular degeneration of right eye with active choroidal neovascularization (HCC)  H35.3211 OCT, Retina - OU - Both Eyes  2. Exudative age-related macular degeneration of left eye with active choroidal neovascularization (HCC)  H35.3221     1.  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  Each eye has now improved and stabilized and seems more responsive to prior therapy since patient has been using nightly oxygen likely  preventing severe nightly hypoxic maculopathy damage from chronic lung disease and some degree of obstructive sleep apnea.  2.  Last intraocular injection of antivegF OU was around 3 months previous.  Each eye has continued to improve thus roughly 4 to 5 weeks off of therapy.  3.  Follow-up in 4 weeks simply to monitor and be ready for treatment should recurrences develop  Ophthalmic Meds Ordered this visit:  No orders of the defined types were placed in this encounter.      Return in about 4 weeks (around 03/24/2021) for DILATE OU, OCT.  There are no Patient Instructions on file for this visit.   Explained the diagnoses, plan, and follow up with the patient and they expressed understanding.  Patient expressed understanding of the importance of proper follow up care.   Alford HighlandGary A. Harmoni Lucus M.D. Diseases & Surgery of the Retina and Vitreous [facility] 02/24/21     Abbreviations: M myopia (nearsighted); A astigmatism; H hyperopia (farsighted); P presbyopia; Mrx spectacle prescription;  CTL contact lenses; OD right eye; OS left eye; OU both eyes  XT exotropia; ET esotropia; PEK punctate epithelial keratitis; PEE punctate epithelial erosions; DES dry eye syndrome; MGD meibomian gland dysfunction; ATs artificial tears; PFAT's preservative free artificial tears; NSC nuclear sclerotic cataract; PSC posterior subcapsular cataract; ERM epi-retinal membrane; PVD posterior vitreous detachment; RD retinal detachment; DM diabetes mellitus; DR diabetic retinopathy; NPDR non-proliferative diabetic retinopathy; PDR proliferative diabetic retinopathy; CSME clinically significant macular edema; DME diabetic macular edema; dbh dot blot hemorrhages; CWS cotton wool spot; POAG primary open angle glaucoma; C/D cup-to-disc ratio;  HVF humphrey visual field; GVF goldmann visual field; OCT optical coherence tomography; IOP intraocular pressure; BRVO Branch retinal vein occlusion; CRVO central retinal  vein occlusion; CRAO central retinal artery occlusion; BRAO branch retinal artery occlusion; RT retinal tear; SB scleral buckle; PPV pars plana vitrectomy; VH Vitreous hemorrhage; PRP panretinal laser photocoagulation; IVK intravitreal kenalog; VMT vitreomacular traction; MH Macular hole;  NVD neovascularization of the disc; NVE neovascularization elsewhere; AREDS age related eye disease study; ARMD age related macular degeneration; POAG primary open angle glaucoma; EBMD epithelial/anterior basement membrane dystrophy; ACIOL anterior chamber intraocular lens; IOL intraocular lens; PCIOL posterior chamber intraocular lens; Phaco/IOL phacoemulsification with intraocular lens placement; PRK photorefractive keratectomy; LASIK laser assisted in situ keratomileusis; HTN hypertension; DM diabetes mellitus; COPD chronic obstructive pulmonary disease

## 2021-02-24 NOTE — Assessment & Plan Note (Addendum)
At 33-month interval today much less subretinal fluid.  Will observe We will follow-up in 4 weeks to monitor closely.  This stability and lack of recurrence is also coincident with continuous use of nightly oxygen for the last 7 months

## 2021-02-24 NOTE — Assessment & Plan Note (Signed)
No active recurrence no subretinal fluid at 11-week 2-day interval today.  We will follow-up in 4 weeks to monitor closely.  This stability and lack of recurrence is also coincident with continuous use of nightly oxygen for the last 7 months

## 2021-03-01 ENCOUNTER — Encounter: Payer: 59 | Admitting: Internal Medicine

## 2021-03-15 ENCOUNTER — Ambulatory Visit: Payer: 59 | Admitting: Internal Medicine

## 2021-03-18 ENCOUNTER — Other Ambulatory Visit: Payer: Self-pay | Admitting: Family Medicine

## 2021-03-24 ENCOUNTER — Ambulatory Visit (INDEPENDENT_AMBULATORY_CARE_PROVIDER_SITE_OTHER): Payer: 59 | Admitting: Ophthalmology

## 2021-03-24 ENCOUNTER — Other Ambulatory Visit: Payer: Self-pay

## 2021-03-24 DIAGNOSIS — J449 Chronic obstructive pulmonary disease, unspecified: Secondary | ICD-10-CM

## 2021-03-24 DIAGNOSIS — H353221 Exudative age-related macular degeneration, left eye, with active choroidal neovascularization: Secondary | ICD-10-CM | POA: Diagnosis not present

## 2021-03-24 DIAGNOSIS — H353211 Exudative age-related macular degeneration, right eye, with active choroidal neovascularization: Secondary | ICD-10-CM

## 2021-03-24 DIAGNOSIS — G4733 Obstructive sleep apnea (adult) (pediatric): Secondary | ICD-10-CM

## 2021-03-24 NOTE — Assessment & Plan Note (Signed)
Patient continues on nighttime oxygen to maximize macular, retinal oxygenation nightly

## 2021-03-24 NOTE — Assessment & Plan Note (Signed)
Review of the notes disclose the patient has been consistently using nighttime oxygen supplementation since early December 2021.  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  Notably patient has not required any antivegF therapy in the left eye for 4.5  Months.  OS will not treat today and will continue to monitor as patient continues to use nightly oxygen and potentially CPAP in the near future once the machine and its equipment is arranged

## 2021-03-24 NOTE — Patient Instructions (Signed)
Patient instructed to contact the office promptly if he is not successful with oxygen use and/or CPAP has not effectively use throughout the night 100% or if new onset visual symptoms or distortions develop

## 2021-03-24 NOTE — Assessment & Plan Note (Signed)
Similarly patient has been using nighttime oxygen throughout the entire night since early December 2021, over which time, the chronic serous subretinal fluid collection which was present as completely resolved slowly early 2022 and has been completely resolved since March 2022 and remained so only 3.5 months post most recent injection antivegF  Continue to encourage nightly oxygen use we will continue to monitor closely for recurrences as the right eye is only now 1.5 months off of medical effective antivegF.  We will follow-up again in 4 to 6 weeks dilate OU with OCT

## 2021-03-24 NOTE — Assessment & Plan Note (Signed)
Patient has acquired his own CPAP machine from an individual yet is awaiting [place] [facility] pulmonary and/or sleep study evaluation

## 2021-03-24 NOTE — Progress Notes (Signed)
03/24/2021     CHIEF COMPLAINT Patient presents for Macular Degeneration   HISTORY OF PRESENT ILLNESS: [name] is a 64 y.o. 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  Currently right eye last injection 3 months previous and left eye 4 months previous.  Last visit was 4 weeks ago at which time decision was made to simply observe, as findings of vision, acuity and OCT at that time showed no signs of recurrence as patient coincidentally had resume nighttime oxygen. and is trying to resume Night Time CPAP usage Last edited by Edmon Crape, MD on 03/24/2021  8:30 AM.      Referring physician: Berniece Pap, FNP [address] [facility] [address]  [place] [address]  HISTORICAL INFORMATION:   Selected notes from the MEDICAL RECORD NUMBER    Lab Results  Component Value Date   HGBA1C 5.4 08/31/2020     CURRENT MEDICATIONS: Current Outpatient Medications (Ophthalmic Drugs)  Medication Sig   Aflibercept 2 MG/0.05ML SOLN Inject 1 Dose into the eye every 8 (eight) weeks.    No current facility-administered medications for this visit. (Ophthalmic Drugs)   Current Outpatient Medications (Other)  Medication Sig   acetaminophen (TYLENOL) 500 MG tablet Take 500-1,000 mg by mouth every 6 (six) hours as needed (for pain.).   aspirin EC 81 MG tablet Take 81 mg by mouth daily.   diclofenac (VOLTAREN) 75 MG EC tablet TAKE 1 TABLET BY MOUTH TWICE A DAY   Fluticasone-Umeclidin-Vilant (TRELEGY ELLIPTA) 100-62.5-25 MCG/INH AEPB Inhale 1 puff into the lungs daily.   ipratropium-albuterol (DUONEB) 0.5-2.5 (3) MG/3ML SOLN INHALE THE CONTENTS OF 1 VIAL VIA NEBULIZER EVERY 6 HOURS AS NEEDED   ketoconazole (NIZORAL) 2 % cream SMARTSIG:1 Topical Every Night   ketoconazole (NIZORAL) 2 % shampoo Apply topically.   LINZESS 290 MCG CAPS capsule Take 290 mcg by mouth daily.   lisinopril  (ZESTRIL) 20 MG tablet TAKE 1 TABLET BY MOUTH ONCE A DAY   Multiple Vitamin (MULTIVITAMIN WITH MINERALS) TABS tablet Take 1 tablet by mouth daily.   pantoprazole (PROTONIX) 20 MG tablet Take 1 tablet (20 mg total) by mouth daily.   pravastatin (PRAVACHOL) 40 MG tablet Take 1 tablet (40 mg total) by mouth daily.   predniSONE (DELTASONE) 10 MG tablet Take 1 tablet (10 mg total) by mouth daily with breakfast.   pregabalin (LYRICA) 100 MG capsule Take 1 capsule (100 mg total) by mouth 2 (two) times daily.   pregabalin (LYRICA) 75 MG capsule Take 1 capsule (75 mg total) by mouth 2 (two) times daily.   PROAIR HFA 108 (90 Base) MCG/ACT inhaler INHALE 2 PUFFS INTO THE LUNGS EVERY 6 HOURS AS NEEDED FOR WHEEZING ORSHORTNESS OF BREATH.   No current facility-administered medications for this visit. (Other)      REVIEW OF SYSTEMS:    ALLERGIES No Known Allergies  PAST MEDICAL HISTORY Past Medical History:  Diagnosis Date   COPD (chronic obstructive pulmonary disease) (HCC)    GERD (gastroesophageal reflux disease)    History of hepatitis C    treated several years ago through [facility]   Hypertension    Past Surgical History:  Procedure Laterality Date   CATARACT EXTRACTION, BILATERAL     EYE SURGERY Bilateral    ORTHOPEDIC SURGERY Bilateral    pt states fracture repairs to arms and legs.   REVERSE SHOULDER ARTHROPLASTY  Left 09/23/2019   Procedure: REVERSE SHOULDER ARTHROPLASTY;  Surgeon: Jones Broom, MD;  Location: [facility];  Service: Orthopedics;  Laterality: Left;    FAMILY HISTORY Family History  Problem Relation Age of Onset   Heart disease Mother    Hypertension Father     SOCIAL HISTORY Social History   Tobacco Use   Smoking status: Former    Packs/day: 1.00    Years: 35.00    Pack years: 35.00    Types: Cigarettes    Quit date: 10/01/2010    Years since quitting: 10.4   Smokeless tobacco: Never  Vaping Use   Vaping Use: Never used  Substance Use Topics   Alcohol  use: Yes    Alcohol/week: 42.0 standard drinks    Types: 42 Cans of beer per week    Comment: daily - 6 drinks a day   Drug use: Yes    Types: Marijuana    Comment: last marijuana 2-3 weeks ago         OPHTHALMIC EXAM:  Base Eye Exam     Visual Acuity (ETDRS)       Right Left   Dist [place] 20/25 -2 20/400         Tonometry (Tonopen, 8:27 AM)       Right Left   Pressure 13 12         Pupils       Pupils React APD   Right PERRL Brisk None   Left PERRL Brisk None         Neuro/Psych     Oriented x3: Yes   Mood/Affect: Normal         Dilation     Both eyes: 1.0% Mydriacyl, 2.5% Phenylephrine @ 8:30 AM           Slit Lamp and Fundus Exam     External Exam       Right Left   External Normal Normal         Slit Lamp Exam       Right Left   Lids/Lashes Normal Normal   Conjunctiva/Sclera White and quiet White and quiet   Cornea Clear Clear   Anterior Chamber Deep and quiet Deep and quiet   Iris Round and reactive Round and reactive   Lens Posterior chamber intraocular lens Posterior chamber intraocular lens   Anterior Vitreous Normal Normal         Fundus Exam       Right Left   Posterior Vitreous Vitrectomized Normal   Disc Normal Normal   C/D Ratio 0.3 0.3   Macula Retinal pigment epithelial mottling, no disciform scar, no macular thickening, no hemorrhage Macular thickening, Retinal pigment epithelial mottling, Retinal pigment epithelial detachment, Epiretinal membrane, old serous rd inferior macula   Vessels Normal Normal   Periphery Normal, Good laser retinopexy temporally and inferonasal from prior retinal detachment repair Normal            IMAGING AND PROCEDURES  Imaging and Procedures for 03/24/21  OCT, Retina - OU - Both Eyes       Right Eye Quality was good. Scan locations included subfoveal. Central Foveal Thickness: 268. Progression has improved. Findings include abnormal foveal contour.   Left Eye Quality was  good. Scan locations included subfoveal. Central Foveal Thickness: 257. Progression has improved. Findings include abnormal foveal contour.   Notes OD much less subretinal fluid in a 3.70-month follow-up interval,  and we will thus observe. OS also with much less subretinal  fluid, at 4.47-month interval today OS much less, on no intravitreal therapy will continue to observe   Each of these findings are coincident with with now excellent compliance with nightly oxygen "all night" since November-December 2021.  In previous months he would not replace it if it was displaced during the night  He has been using it nightly throughout the entire night for the past 7-8 months               ASSESSMENT/PLAN:  COPD mixed type (HCC) Patient continues on nighttime oxygen to maximize macular, retinal oxygenation nightly  Obstructive sleep apnea hypopnea, severe Patient has acquired his own CPAP machine from an individual yet is awaiting [place] [facility] pulmonary and/or sleep study evaluation  Exudative age-related macular degeneration of left eye with active choroidal neovascularization (HCC) Review of the notes disclose the patient has been consistently using nighttime oxygen supplementation since early December 2021.  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  Notably patient has not required any antivegF therapy in the left eye for 4.5  Months.  OS will not treat today and will continue to monitor as patient continues to use nightly oxygen and potentially CPAP in the near future once the machine and its equipment is arranged  Exudative age-related macular degeneration of right eye with active choroidal neovascularization (HCC) Similarly patient has been using nighttime oxygen throughout the entire night since early December 2021, over which time, the chronic serous subretinal fluid  collection which was present as completely resolved slowly early 2022 and has been completely resolved since March 2022 and remained so only 3.5 months post most recent injection antivegF  Continue to encourage nightly oxygen use we will continue to monitor closely for recurrences as the right eye is only now 1.5 months off of medical effective antivegF.  We will follow-up again in 4 to 6 weeks dilate OU with OCT      ICD-10-CM   1. Exudative age-related macular degeneration of left eye with active choroidal neovascularization (HCC)  H35.3221 OCT, Retina - OU - Both Eyes    2. COPD mixed type (HCC)  J44.9     3. Obstructive sleep apnea hypopnea, severe  G47.33     4. Exudative age-related macular degeneration of right eye with active choroidal neovascularization (HCC)  H35.3211 OCT, Retina - OU - Both Eyes      1.  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 No antivegF has been delivered to the left eye for 4.5 months and for the  right eye IN 3.5 months with no sign of recurrences and completely dry subretinal space OU.  This latter finding is coincident with effective nightly use of oxygen supplementation to prevent the most severe forms of sleep apnea and macular hypoxia pending availability and reception institution of CPAP therapy, due to supply issues  2.  Patient will continue to use nightly oxygen and replacement of such if removed during the middle of the night in order to maximize the current excellent result anatomically OU  3.  We will follow closely and return follow-up visit in 5 to 6 weeks with observation again and OCT monitoring upon next visit  Ophthalmic Meds Ordered this visit:  No orders of the defined types were placed in this encounter.       Return in about 6 weeks (around 05/05/2021) for DILATE OU, OCT.  Patient Instructions  Patient instructed to contact the office promptly if he is not successful with oxygen use and/or CPAP has not effectively use throughout the night 100% or if new onset visual symptoms or distortions develop   Explained the diagnoses, plan, and follow up with the patient and they expressed understanding.  Patient expressed understanding of the importance of proper follow up care.   Alford HighlandGary A. Matthan Sledge M.D. Diseases & Surgery of the Retina and Vitreous [facility] 03/24/21     Abbreviations: M myopia (nearsighted); A astigmatism; H hyperopia (farsighted); P presbyopia; Mrx spectacle prescription;  CTL contact lenses; OD right eye; OS left eye; OU both eyes  XT exotropia; ET esotropia; PEK punctate epithelial keratitis; PEE punctate epithelial erosions; DES dry eye syndrome; MGD meibomian gland dysfunction; ATs artificial tears; PFAT's preservative free artificial tears; NSC nuclear sclerotic cataract; PSC posterior subcapsular cataract; ERM epi-retinal membrane; PVD posterior vitreous detachment; RD retinal detachment; DM diabetes mellitus; DR diabetic retinopathy; NPDR non-proliferative diabetic retinopathy; PDR proliferative diabetic retinopathy; CSME clinically significant macular edema; DME diabetic macular edema; dbh dot blot hemorrhages; CWS cotton wool spot; POAG primary open angle glaucoma; C/D cup-to-disc ratio; HVF humphrey visual field; GVF goldmann visual field; OCT optical coherence tomography; IOP intraocular pressure; BRVO Branch retinal vein occlusion; CRVO central retinal vein occlusion; CRAO central retinal artery occlusion; BRAO branch retinal artery occlusion; RT retinal tear; SB scleral buckle; PPV pars plana vitrectomy; VH Vitreous hemorrhage; PRP panretinal laser photocoagulation; IVK intravitreal kenalog; VMT vitreomacular traction; MH Macular hole;  NVD neovascularization of  the disc; NVE neovascularization elsewhere; AREDS age related eye disease study; ARMD age related macular degeneration; POAG primary open angle glaucoma; EBMD epithelial/anterior basement membrane dystrophy; ACIOL anterior chamber intraocular lens; IOL intraocular lens; PCIOL posterior chamber intraocular lens; Phaco/IOL phacoemulsification with intraocular lens placement; PRK photorefractive keratectomy; LASIK laser assisted in situ keratomileusis; HTN hypertension; DM diabetes mellitus; COPD chronic obstructive pulmonary disease

## 2021-03-25 ENCOUNTER — Telehealth: Payer: Self-pay

## 2021-03-25 NOTE — Telephone Encounter (Signed)
Medication ordered already on 03/18/2021

## 2021-04-14 ENCOUNTER — Ambulatory Visit: Payer: Commercial Managed Care - PPO | Admitting: Internal Medicine

## 2021-04-15 ENCOUNTER — Other Ambulatory Visit: Payer: Self-pay | Admitting: Internal Medicine

## 2021-04-19 ENCOUNTER — Other Ambulatory Visit: Payer: Self-pay | Admitting: Podiatry

## 2021-04-19 NOTE — Telephone Encounter (Signed)
Please advise 

## 2021-04-20 ENCOUNTER — Telehealth: Payer: Self-pay | Admitting: Adult Health

## 2021-04-20 MED ORDER — PANTOPRAZOLE SODIUM 20 MG PO TBEC: 20.0000 mg | DELAYED_RELEASE_TABLET | Freq: Every day | ORAL | 1 refills | Status: DC

## 2021-04-20 NOTE — Telephone Encounter (Signed)
Patient called and is requesting a refill on hispantoprazole (PROTONIX) 20 MG tablet

## 2021-05-03 ENCOUNTER — Ambulatory Visit (INDEPENDENT_AMBULATORY_CARE_PROVIDER_SITE_OTHER): Payer: 59 | Admitting: Primary Care

## 2021-05-03 ENCOUNTER — Ambulatory Visit: Payer: 59 | Admitting: Adult Health

## 2021-05-03 ENCOUNTER — Other Ambulatory Visit: Payer: Self-pay

## 2021-05-03 ENCOUNTER — Encounter: Payer: Self-pay | Admitting: Primary Care

## 2021-05-03 VITALS — BP 128/90 | HR 95 | Temp 97.8°F | Ht 74.0 in | Wt 229.2 lb

## 2021-05-03 DIAGNOSIS — J449 Chronic obstructive pulmonary disease, unspecified: Secondary | ICD-10-CM

## 2021-05-03 DIAGNOSIS — G4733 Obstructive sleep apnea (adult) (pediatric): Secondary | ICD-10-CM

## 2021-05-03 NOTE — Assessment & Plan Note (Signed)
Patient had HST in February 2022 that showed severe OSA. We palced a DME order back in April 2022 for him to be started on auto CPAP 5-15cm h20. He is still awaiting CPAP from [facility], he was able to borrow a friend's [employer]Med Aircurve 10 machine. I advised that he will need to contact [facility] to have them help him register machine under his name, change pressure setting and provide him with supplies.  Recommendations: - Aim to wear CPAP every night for 4-6 hous or more; do not drive if exeriencing excessive daytime fatigue or somolence. Continue to work on weight loss efforts.  Orders: - Fax order for CPAP machine set up with [facility]. Settings auto 5-15cm h20 or 10cm h20. Please supply patient with CPAP supplies and new SD card. He will also need O2 adapter up for CPAP.   Follow-up: - 3 months with Dr. Maple Hudson or Waynetta Sandy NP

## 2021-05-03 NOTE — Assessment & Plan Note (Signed)
-   Stable, continue Trelegy and prn albuterol hfa

## 2021-05-03 NOTE — Patient Instructions (Addendum)
Recommendations: - Please call [facility] [phone] to set up a time to bring CPAP machine. Setting need to change to either auto 5-15cm h20 or 10cm h20. Supply you with new SD card and supplies.  - Aim to wear CPAP every night for 4-6 hous or more; do not drive if exeriencing excessive daytime fatigue or somolence. Continue to work on weifght  Orders: - Place order for CPAP machine set up with [facility]. Settings auto 5-15cm h20 or 10cm h20. Please supply patient with supplies and new SD card. He will also need O2 adapter up for CPAP.   Follow-up: - 3 months with Dr. Maple Hudson or Waynetta Sandy NP     CPAP and BPAP Information CPAP and BPAP (also called BiPAP) are methods that use air pressure to keep your airways open and to help you breathe well. CPAP and BPAP use different amounts of pressure. Your health care provider will tell you whether CPAP or BPAP would be more helpful for you. CPAP stands for "continuous positive airway pressure." With CPAP, the amount of pressure stays the same while you breathe in (inhale) and out (exhale). BPAP stands for "bi-level positive airway pressure." With BPAP, the amount of pressure will be higher when you inhale and lower when you exhale. This allows you to take larger breaths. CPAP or BPAP may be used in the hospital, or your health care provider may want you to use it at home. You may need to have a sleep study before your healthcare provider can order a machine for you to use at home. What are the advantages? CPAP or BPAP can be helpful if you have: Sleep apnea. Chronic obstructive pulmonary disease (COPD). Heart failure. Medical conditions that cause muscle weakness, including muscular dystrophy or amyotrophic lateral sclerosis (ALS). Other problems that cause breathing to be shallow, weak, abnormal, or difficult. CPAP and BPAP are most commonly used for obstructive sleep apnea (OSA) to keepthe airways from collapsing when the muscles relax during sleep. What  are the risks? Generally, this is a safe treatment. However, problems may occur, including: Irritated skin or skin sores if the mask does not fit properly. Dry or stuffy nose or nosebleeds. Dry mouth. Feeling gassy or bloated. Sinus or lung infection if the equipment is not cleaned properly. When should CPAP or BPAP be used? In most cases, the mask only needs to be worn during sleep. Generally, the mask needs to be worn throughout the night and during any daytime naps. People with certain medical conditions may also need to wear the mask at other times, such as when they are awake. Follow instructions from your health care providerabout when to use the machine. What happens during CPAP or BPAP?  Both CPAP and BPAP are provided by a small machine with a flexible plastic tube that attaches to a plastic mask that you wear. Air is blown through the mask into your nose or mouth. The amount of pressure that is used to blow the air can be adjusted on the machine. Your health care provider will set the pressuresetting and help you find the best mask for you. Tips for using the mask Because the mask needs to be snug, some people feel trapped or closed-in (claustrophobic) when first using the mask. If you feel this way, you may need to get used to the mask. One way to do this is to hold the mask loosely over your nose or mouth and then gradually apply the mask more snugly. You can also gradually increase  the amount of time that you use the mask. Masks are available in various types and sizes. If your mask does not fit well, talk with your health care provider about getting a different one. Some common types of masks include: Full face masks, which fit over the mouth and nose. Nasal masks, which fit over the nose. Nasal pillow or prong masks, which fit into the nostrils. If you are using a mask that fits over your nose and you tend to breathe through your mouth, a chin strap may be applied to help keep your  mouth closed. Use a skin barrier to protect your skin as told by your health care provider. Some CPAP and BPAP machines have alarms that may sound if the mask comes off or develops a leak. If you have trouble with the mask, it is very important that you talk with your health care provider about finding a way to make the mask easier to tolerate. Do not stop using the mask. There could be a negative impact on your health if you stop using the mask. Tips for using the machine Place your CPAP or BPAP machine on a secure table or stand near an electrical outlet. Know where the on/off switch is on the machine. Follow instructions from your health care provider about how to set the pressure on your machine and when you should use it. Do not eat or drink while the CPAP or BPAP machine is on. Food or fluids could get pushed into your lungs by the pressure of the CPAP or BPAP. For home use, CPAP and BPAP machines can be rented or purchased through home health care companies. Many different brands of machines are available. Renting a machine before purchasing may help you find out which particular machine works well for you. Your health insurance company may also decide which machine you may get. Keep the CPAP or BPAP machine and attachments clean. Ask your health care provider for specific instructions. Check the humidifier if you have a dry stuffy nose or nosebleeds. Make sure it is working correctly. Follow these instructions at home: Take over-the-counter and prescription medicines only as told by your health care provider. Ask if you can take sinus medicine if your sinuses are blocked. Do not use any products that contain nicotine or tobacco. These products include cigarettes, chewing tobacco, and vaping devices, such as e-cigarettes. If you need help quitting, ask your health care provider. Keep all follow-up visits. This is important. Contact a health care provider if: You have redness or pressure sores  on your head, face, mouth, or nose from the mask or head gear. You have trouble using the CPAP or BPAP machine. You cannot tolerate wearing the CPAP or BPAP mask. Someone tells you that you snore even when wearing your CPAP or BPAP. Get help right away if: You have trouble breathing. You feel confused. Summary CPAP and BPAP are methods that use air pressure to keep your airways open and to help you breathe well. If you have trouble with the mask, it is very important that you talk with your health care provider about finding a way to make the mask easier to tolerate. Do not stop using the mask. There could be a negative impact to your health if you stop using the mask. Follow instructions from your health care provider about when to use the machine. This information is not intended to replace advice given to you by your health care provider. Make sure you discuss any questions  you have with your healthcare provider. Document Revised: 08/14/2020 Document Reviewed: 08/14/2020 Elsevier Patient Education  2022 [employer].

## 2021-05-03 NOTE — Progress Notes (Signed)
@Patient  ID: III, male    DOB: 1956-06-01, 65 y.o.   MRN: [id number]  No chief complaint on file.   Referring provider: [id number], F*  HPI: 70 year former smoker. PMH COPD, chronic respiratory failure, OSA, gerd, HTN. Patient of Dr. 77, last seen on 01/22/21. Maintained on Trelegy 100.   05/03/2021 Patient presents today for follow-up. HST in February 2022 showed severe OSA, AHI 63/hr. We placed an order for new CPAP machine with DME in April 2022. He has been able to borrowed a machine from a friend that he would like to use until he is able to get a CPAP machine from [facility].  He wears oxygen for hx macular degeneration. No issues with his breathing. He is compliant with Trelegy May 2022 daily. Requires his Albuterol inhaler 0-2 times a day. He was treated from COPD exacerbation in June with prednisone taper.   No Known Allergies  Immunization History  Administered Date(s) Administered   Influenza Split 06/19/2017   Influenza,inj,Quad PF,6+ Mos 10/01/2014, 07/27/2015, 08/20/2018, 06/21/2019   Influenza-Unspecified 06/19/2020   Moderna Sars-Covid-2 Vaccination 11/13/2019, 12/04/2019   PPD Test 08/19/2015   Pneumococcal Conjugate-13 10/01/2014   Pneumococcal Polysaccharide-23 01/10/2018   Tdap 08/09/2019    Past Medical History:  Diagnosis Date   COPD (chronic obstructive pulmonary disease) (HCC)    GERD (gastroesophageal reflux disease)    History of hepatitis C    treated several years ago through [facility]   Hypertension     Tobacco History: Social History   Tobacco Use  Smoking Status Former   Packs/day: 1.00   Years: 35.00   Pack years: 35.00   Types: Cigarettes   Start date: 12   Quit date: 10/01/2010   Years since quitting: 10.5  Smokeless Tobacco Never   Counseling given: Not Answered   Outpatient Medications Prior to Visit  Medication Sig Dispense Refill   acetaminophen (TYLENOL) 500 MG tablet Take 500-1,000 mg by mouth every 6  (six) hours as needed (for pain.).     Aflibercept 2 MG/0.05ML SOLN Inject 1 Dose into the eye every 8 (eight) weeks.      aspirin EC 81 MG tablet Take 81 mg by mouth daily.     diclofenac (VOLTAREN) 75 MG EC tablet TAKE 1 TABLET BY MOUTH TWICE A DAY 50 tablet 2   Fluticasone-Umeclidin-Vilant (TRELEGY ELLIPTA) 100-62.5-25 MCG/INH AEPB Inhale 1 puff into the lungs daily. 60 each 12   ipratropium-albuterol (DUONEB) 0.5-2.5 (3) MG/3ML SOLN INHALE THE CONTENTS OF 1 VIAL VIA NEBULIZER EVERY 6 HOURS AS NEEDED 180 mL 9   ketoconazole (NIZORAL) 2 % cream SMARTSIG:1 Topical Every Night     ketoconazole (NIZORAL) 2 % shampoo Apply topically.     LINZESS 290 MCG CAPS capsule Take 290 mcg by mouth daily.     lisinopril (ZESTRIL) 20 MG tablet TAKE 1 TABLET BY MOUTH ONCE A DAY 90 tablet 3   Multiple Vitamin (MULTIVITAMIN WITH MINERALS) TABS tablet Take 1 tablet by mouth daily.     pantoprazole (PROTONIX) 20 MG tablet Take 1 tablet (20 mg total) by mouth daily. 90 tablet 1   pravastatin (PRAVACHOL) 40 MG tablet Take 1 tablet (40 mg total) by mouth daily. 90 tablet 3   pregabalin (LYRICA) 100 MG capsule Take 1 capsule (100 mg total) by mouth 2 (two) times daily. 60 capsule 3   pregabalin (LYRICA) 75 MG capsule Take 1 capsule (75 mg total) by mouth 2 (two) times daily. 60 capsule 5  PROAIR HFA 108 (90 Base) MCG/ACT inhaler INHALE 2 PUFFS INTO THE LUNGS EVERY 6 HOURS AS NEEDED FOR WHEEZING ORSHORTNESS OF BREATH. 18 g 12   predniSONE (DELTASONE) 10 MG tablet Take 1 tablet (10 mg total) by mouth daily with breakfast. 20 tablet 0   No facility-administered medications prior to visit.    Review of Systems  Review of Systems  Constitutional: Negative.   HENT: Negative.    Respiratory:  Negative for cough, chest tightness, shortness of breath and wheezing.   Cardiovascular: Negative.     Physical Exam  BP 128/90   Pulse 95   Temp 97.8 F (36.6 C) (Oral)   Ht 6\' 2"  (1.88 m)   Wt 229 lb 3.2 oz (104 kg)    SpO2 98%   BMI 29.43 kg/m  Physical Exam Constitutional:      Appearance: Normal appearance.  HENT:     Head: Normocephalic and atraumatic.     Nose:     Comments: Mallampati class III Cardiovascular:     Rate and Rhythm: Normal rate and regular rhythm.  Pulmonary:     Effort: Pulmonary effort is normal.     Breath sounds: Normal breath sounds. No wheezing, rhonchi or rales.  Musculoskeletal:        General: Normal range of motion.  Skin:    General: Skin is warm and dry.  Neurological:     General: No focal deficit present.     Mental Status: He is alert and oriented to person, place, and time. Mental status is at baseline.  Psychiatric:        Mood and Affect: Mood normal.        Behavior: Behavior normal.        Thought Content: Thought content normal.        Judgment: Judgment normal.     Lab Results:  CBC    Component Value Date/Time   WBC 5.3 01/29/2021 0945   RBC 4.25 01/29/2021 0945   HGB 14.4 01/29/2021 0945   HCT 42.2 01/29/2021 0945   PLT 164.0 01/29/2021 0945   MCV 99.3 01/29/2021 0945   MCH 33.6 10/28/2020 1817   MCHC 34.2 01/29/2021 0945   RDW 13.1 01/29/2021 0945   LYMPHSABS 1.4 01/29/2021 0945   MONOABS 0.6 01/29/2021 0945   EOSABS 0.1 01/29/2021 0945   BASOSABS 0.0 01/29/2021 0945    BMET    Component Value Date/Time   NA 138 01/29/2021 0945   K 4.3 01/29/2021 0945   CL 104 01/29/2021 0945   CO2 23 01/29/2021 0945   GLUCOSE 93 01/29/2021 0945   BUN 14 01/29/2021 0945   CREATININE 0.78 01/29/2021 0945   CREATININE 0.99 08/09/2019 1551   CALCIUM 9.1 01/29/2021 0945   GFRNONAA >60 10/28/2020 1817   GFRAA >60 09/24/2019 0230    BNP No results found for: BNP  ProBNP    Component Value Date/Time   PROBNP 9.0 01/29/2021 0945    Imaging: No results found.   Assessment & Plan:   Obstructive sleep apnea hypopnea, severe Patient had HST in February 2022 that showed severe OSA. We palced a DME order back in April 2022 for him to  be started on auto CPAP 5-15cm h20. He is still awaiting CPAP from [facility], he was able to borrow a friend's [employer]Med Aircurve 10 machine. I advised that he will need to contact [facility] to have them help him register machine under his name, change pressure setting and provide him with supplies.  Recommendations: - Aim to wear CPAP every night for 4-6 hous or more; do not drive if exeriencing excessive daytime fatigue or somolence. Continue to work on weight loss efforts.  Orders: - Fax order for CPAP machine set up with [facility]. Settings auto 5-15cm h20 or 10cm h20. Please supply patient with CPAP supplies and new SD card. He will also need O2 adapter up for CPAP.   Follow-up: - 3 months with Dr. Maple Hudson or Waynetta Sandy NP    COPD mixed type [facility] [place]) - Stable, continue Trelegy and prn albuterol hfa      Glenford Bayley, NP 05/03/2021

## 2021-05-04 ENCOUNTER — Encounter (INDEPENDENT_AMBULATORY_CARE_PROVIDER_SITE_OTHER): Payer: Self-pay | Admitting: Ophthalmology

## 2021-05-04 ENCOUNTER — Ambulatory Visit (INDEPENDENT_AMBULATORY_CARE_PROVIDER_SITE_OTHER): Payer: 59 | Admitting: Ophthalmology

## 2021-05-04 DIAGNOSIS — H353211 Exudative age-related macular degeneration, right eye, with active choroidal neovascularization: Secondary | ICD-10-CM

## 2021-05-04 DIAGNOSIS — G4733 Obstructive sleep apnea (adult) (pediatric): Secondary | ICD-10-CM

## 2021-05-04 DIAGNOSIS — H353221 Exudative age-related macular degeneration, left eye, with active choroidal neovascularization: Secondary | ICD-10-CM

## 2021-05-04 DIAGNOSIS — H35712 Central serous chorioretinopathy, left eye: Secondary | ICD-10-CM | POA: Diagnosis not present

## 2021-05-04 NOTE — Progress Notes (Signed)
05/04/2021     CHIEF COMPLAINT Patient presents for Retina Follow Up   HISTORY OF PRESENT ILLNESS: [name] is a 65 y.o. male who presents to the clinic today for:   HPI     Retina Follow Up           Diagnosis: Wet AMD   Laterality: both eyes   Onset: 6 weeks ago   Severity: mild   Duration: 6 weeks   Course: stable         Comments   6 week fu OU and OCT Pt states VA OU stable since last visit. Pt denies FOL, floaters, or ocular pain OU.        Last edited by Demetrios Loll, COA on 05/04/2021  8:15 AM.      Referring physician: Berniece Pap, FNP [address]  [place] [address]  HISTORICAL INFORMATION:   Selected notes from the MEDICAL RECORD NUMBER    Lab Results  Component Value Date   HGBA1C 5.4 08/31/2020     CURRENT MEDICATIONS: Current Outpatient Medications (Ophthalmic Drugs)  Medication Sig   Aflibercept 2 MG/0.05ML SOLN Inject 1 Dose into the eye every 8 (eight) weeks.    No current facility-administered medications for this visit. (Ophthalmic Drugs)   Current Outpatient Medications (Other)  Medication Sig   acetaminophen (TYLENOL) 500 MG tablet Take 500-1,000 mg by mouth every 6 (six) hours as needed (for pain.).   aspirin EC 81 MG tablet Take 81 mg by mouth daily.   diclofenac (VOLTAREN) 75 MG EC tablet TAKE 1 TABLET BY MOUTH TWICE A DAY   Fluticasone-Umeclidin-Vilant (TRELEGY ELLIPTA) 100-62.5-25 MCG/INH AEPB Inhale 1 puff into the lungs daily.   ipratropium-albuterol (DUONEB) 0.5-2.5 (3) MG/3ML SOLN INHALE THE CONTENTS OF 1 VIAL VIA NEBULIZER EVERY 6 HOURS AS NEEDED   ketoconazole (NIZORAL) 2 % cream SMARTSIG:1 Topical Every Night   ketoconazole (NIZORAL) 2 % shampoo Apply topically.   LINZESS 290 MCG CAPS capsule Take 290 mcg by mouth daily.   lisinopril (ZESTRIL) 20 MG tablet TAKE 1 TABLET BY MOUTH ONCE A DAY   Multiple Vitamin (MULTIVITAMIN WITH MINERALS) TABS tablet Take 1 tablet by mouth daily.    pantoprazole (PROTONIX) 20 MG tablet Take 1 tablet (20 mg total) by mouth daily.   pravastatin (PRAVACHOL) 40 MG tablet Take 1 tablet (40 mg total) by mouth daily.   pregabalin (LYRICA) 100 MG capsule Take 1 capsule (100 mg total) by mouth 2 (two) times daily.   pregabalin (LYRICA) 75 MG capsule Take 1 capsule (75 mg total) by mouth 2 (two) times daily.   PROAIR HFA 108 (90 Base) MCG/ACT inhaler INHALE 2 PUFFS INTO THE LUNGS EVERY 6 HOURS AS NEEDED FOR WHEEZING ORSHORTNESS OF BREATH.   No current facility-administered medications for this visit. (Other)      REVIEW OF SYSTEMS:    ALLERGIES No Known Allergies  PAST MEDICAL HISTORY Past Medical History:  Diagnosis Date   COPD (chronic obstructive pulmonary disease) (HCC)    GERD (gastroesophageal reflux disease)    History of hepatitis C    treated several years ago through [facility]   Hypertension    Past Surgical History:  Procedure Laterality Date   CATARACT EXTRACTION, BILATERAL     EYE SURGERY Bilateral    ORTHOPEDIC SURGERY Bilateral    pt states fracture repairs to arms and legs.   REVERSE SHOULDER ARTHROPLASTY Left 09/23/2019   Procedure: REVERSE SHOULDER ARTHROPLASTY;  Surgeon:  Jones Broom, MD;  Location: [facility];  Service: Orthopedics;  Laterality: Left;    FAMILY HISTORY Family History  Problem Relation Age of Onset   Heart disease Mother    Hypertension Father     SOCIAL HISTORY Social History   Tobacco Use   Smoking status: Former    Packs/day: 1.00    Years: 35.00    Pack years: 35.00    Types: Cigarettes    Start date: 20    Quit date: 10/01/2010    Years since quitting: 10.5   Smokeless tobacco: Never  Vaping Use   Vaping Use: Never used  Substance Use Topics   Alcohol use: Yes    Alcohol/week: 42.0 standard drinks    Types: 42 Cans of beer per week    Comment: daily - 6 drinks a day   Drug use: Yes    Types: Marijuana    Comment: last marijuana 2-3 weeks ago         OPHTHALMIC  EXAM:  Base Eye Exam     Visual Acuity (ETDRS)       Right Left   Dist [place] 20/40 -2 20/400   Dist ph [place] NI 20/200         Tonometry (Tonopen, 8:18 AM)       Right Left   Pressure 15 13         Pupils       Pupils Dark Light Shape React APD   Right PERRL 3 2 Round Brisk None   Left PERRL 3 2 Round Brisk None         Visual Fields (Counting fingers)       Left Right    Full Full         Extraocular Movement       Right Left    Full Full         Neuro/Psych     Oriented x3: Yes   Mood/Affect: Normal         Dilation     Both eyes: 1.0% Mydriacyl, 2.5% Phenylephrine @ 8:19 AM           Slit Lamp and Fundus Exam     External Exam       Right Left   External Normal Normal         Slit Lamp Exam       Right Left   Lids/Lashes Normal Normal   Conjunctiva/Sclera White and quiet White and quiet   Cornea Clear Clear   Anterior Chamber Deep and quiet Deep and quiet   Iris Round and reactive Round and reactive   Lens Posterior chamber intraocular lens Posterior chamber intraocular lens   Anterior Vitreous Normal Normal         Fundus Exam       Right Left   Posterior Vitreous Vitrectomized Normal   Disc Normal Normal   C/D Ratio 0.3 0.3   Macula Retinal pigment epithelial mottling, no disciform scar, no macular thickening, no hemorrhage Macular thickening, Retinal pigment epithelial mottling, Retinal pigment epithelial detachment, Epiretinal membrane, old serous rd inferior macula   Vessels Normal Normal   Periphery Normal, Good laser retinopexy temporally and inferonasal from prior retinal detachment repair Normal            IMAGING AND PROCEDURES  Imaging and Procedures for 05/04/21  OCT, Retina - OU - Both Eyes       Right Eye Quality was good. Scan locations included subfoveal. Central  Foveal Thickness: 265. Progression has improved. Findings include abnormal foveal contour.   Left Eye Quality was good. Scan  locations included subfoveal. Central Foveal Thickness: 298. Progression has improved. Findings include abnormal foveal contour, subretinal fluid.   Notes OD much less subretinal fluid in a 2668-month follow-up interval,  and we will thus observe. OS also with much less subretinal fluid, at 6 -month interval today OS much less, on no intravitreal therapy will continue to observe   Each of these findings are coincident with with now excellent compliance with nightly oxygen "all night" since November-December 2021.  In previous months he would not replace it if it was displaced during the night  He has been using it nightly throughout the entire night for the past 9 months               ASSESSMENT/PLAN:  Central serous chorioretinopathy of left eye Slight increase in subretinal fluid left eye as compared to 5 weeks previous, but still less than June 2022 OS  Will continue to observe as patient continues on nightly oxygen supplementation potentially helping prevent macular hypoxia and still awaiting final CPAP equipment delivery  Exudative age-related macular degeneration of left eye with active choroidal neovascularization (HCC) No clear signs of CNVM OS  Exudative age-related macular degeneration of right eye with active choroidal neovascularization (HCC) OD now some 5 months post most recent injection and no signs of recurrence with excellent visual acuity.  Has continued on oxygen supplementation at night  Patient to start actual CPAP with oxygen supplementation in the very near future  Obstructive sleep apnea hypopnea, severe Patient now scheduled to commence with CPAP with oxygen supplementation in the very near future after testing positive for sleep apnea some 6 months prior.  Equipment now arriving     ICD-10-CM   1. Exudative age-related macular degeneration of left eye with active choroidal neovascularization (HCC)  H35.3221 OCT, Retina - OU - Both Eyes    2. Exudative  age-related macular degeneration of right eye with active choroidal neovascularization (HCC)  H35.3211 OCT, Retina - OU - Both Eyes    3. Central serous chorioretinopathy of left eye  H35.712     4. Obstructive sleep apnea hypopnea, severe  G47.33       1.  2.  3.  Ophthalmic Meds Ordered this visit:  No orders of the defined types were placed in this encounter.      Return in about 8 weeks (around 06/29/2021) for DILATE OU, OCT.  There are no Patient Instructions on file for this visit.   Explained the diagnoses, plan, and follow up with the patient and they expressed understanding.  Patient expressed understanding of the importance of proper follow up care.   Alford HighlandGary A. Daran Favaro M.D. Diseases & Surgery of the Retina and Vitreous [facility] 05/04/21     Abbreviations: M myopia (nearsighted); A astigmatism; H hyperopia (farsighted); P presbyopia; Mrx spectacle prescription;  CTL contact lenses; OD right eye; OS left eye; OU both eyes  XT exotropia; ET esotropia; PEK punctate epithelial keratitis; PEE punctate epithelial erosions; DES dry eye syndrome; MGD meibomian gland dysfunction; ATs artificial tears; PFAT's preservative free artificial tears; NSC nuclear sclerotic cataract; PSC posterior subcapsular cataract; ERM epi-retinal membrane; PVD posterior vitreous detachment; RD retinal detachment; DM diabetes mellitus; DR diabetic retinopathy; NPDR non-proliferative diabetic retinopathy; PDR proliferative diabetic retinopathy; CSME clinically significant macular edema; DME diabetic macular edema; dbh dot blot hemorrhages; CWS cotton wool spot; POAG primary open  angle glaucoma; C/D cup-to-disc ratio; HVF humphrey visual field; GVF goldmann visual field; OCT optical coherence tomography; IOP intraocular pressure; BRVO Branch retinal vein occlusion; CRVO central retinal vein occlusion; CRAO central retinal artery occlusion; BRAO branch retinal artery occlusion; RT retinal  tear; SB scleral buckle; PPV pars plana vitrectomy; VH Vitreous hemorrhage; PRP panretinal laser photocoagulation; IVK intravitreal kenalog; VMT vitreomacular traction; MH Macular hole;  NVD neovascularization of the disc; NVE neovascularization elsewhere; AREDS age related eye disease study; ARMD age related macular degeneration; POAG primary open angle glaucoma; EBMD epithelial/anterior basement membrane dystrophy; ACIOL anterior chamber intraocular lens; IOL intraocular lens; PCIOL posterior chamber intraocular lens; Phaco/IOL phacoemulsification with intraocular lens placement; PRK photorefractive keratectomy; LASIK laser assisted in situ keratomileusis; HTN hypertension; DM diabetes mellitus; COPD chronic obstructive pulmonary disease

## 2021-05-04 NOTE — Assessment & Plan Note (Signed)
Slight increase in subretinal fluid left eye as compared to 5 weeks previous, but still less than June 2022 OS  Will continue to observe as patient continues on nightly oxygen supplementation potentially helping prevent macular hypoxia and still awaiting final CPAP equipment delivery

## 2021-05-04 NOTE — Assessment & Plan Note (Signed)
OD now some 5 months post most recent injection and no signs of recurrence with excellent visual acuity.  Has continued on oxygen supplementation at night  Patient to start actual CPAP with oxygen supplementation in the very near future

## 2021-05-04 NOTE — Assessment & Plan Note (Signed)
Patient now scheduled to commence with CPAP with oxygen supplementation in the very near future after testing positive for sleep apnea some 6 months prior.  Equipment now arriving

## 2021-05-04 NOTE — Assessment & Plan Note (Signed)
No clear signs of CNVM OS

## 2021-05-11 ENCOUNTER — Telehealth: Payer: Self-pay

## 2021-05-11 DIAGNOSIS — G4733 Obstructive sleep apnea (adult) (pediatric): Secondary | ICD-10-CM

## 2021-05-11 NOTE — Telephone Encounter (Signed)
EW please advise if you want to order a new cpap for the pt since [facility] cannot use the settings on his old machine.  thanks

## 2021-05-14 NOTE — Telephone Encounter (Signed)
Attempted to call [facility] at [phone] and was on hold for over 10 min, will have to call back later

## 2021-05-14 NOTE — Telephone Encounter (Signed)
Can you double confirm that hey can not do a set pressure of 10cm h20 (if not able to do auto 5-15cm h20)

## 2021-05-17 NOTE — Telephone Encounter (Signed)
Called [facility] spoke with Kennith Center she said it has to be a set pressure setting.   Order placed for set pressure setting of 10cm h2o per Buelah Manis NP.  Nothing further needed at this time.

## 2021-06-15 ENCOUNTER — Ambulatory Visit: Payer: 59 | Admitting: Adult Health

## 2021-06-19 ENCOUNTER — Encounter: Payer: Self-pay | Admitting: Internal Medicine

## 2021-06-19 NOTE — Assessment & Plan Note (Addendum)
Trelegy does help, used as directed. Has rescue inhaler and nebulizer when needed. Plan- continue CXR surveillance.Given samples Breztri to compare with Trelegy Keep appt for CT screening.

## 2021-06-19 NOTE — Assessment & Plan Note (Signed)
We are asking [facility] to make him priority to get his CPAP as ordered. Plan o auto 5-20

## 2021-06-19 NOTE — Assessment & Plan Note (Signed)
Continues to need O2 2L for sleep as originally prescribed by his eye doctor for vision preservation.

## 2021-06-19 NOTE — Progress Notes (Signed)
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 Despite repetitive coaching he did not generate acceptable curves.  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 Mainly for sleep. HST 10/24/20- AHI 62.5/ hr, desaturation to 68%- mean 88% (RA) body weight 225 lbs CPAP auto 5-20 [facility] ordered 01/05/21    Download- Waiting on machine from [facility] weight today 229 lbs ------Feels like breathing is a little worse since last visit, but feels the trelegy is working. Still waiting on cpap machine from [facility]. CT chest screening due 02/03/21 Trelegy helps breathing. Uses O2 for sleep but can't take tank to work and still does hard physical work.   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 Mainly for sleep.   Can't take O2 to his very physical job. Saw NP 05/03/21- CPAP [facility]- not yet delivered Using CPAP from a friend. Prednisone taper for COPD exacerbation June, 2022.  Body  weight today-230 lbs Covid vax-2 Moderna Flu vax- ------Pt states he SOB.      Arrival RA O2 sat today 96%. Using O2 2L for sleep and has adapter when he gets to pick up his new CPAP( 5-15) this week. Has been using one borrowed friend set on 10. No acute events. Does ok at work, but very DOE on stairs. Discussed portable O2 for use away from work> walk test today. Discussed CT showing atherosclerosis> cardiology referral for risk stratification. Girlfreind home after resection of brain tumor- probably met. Walk Test room air 06/21/21- 3 laps.O2 nadir 91%, Max HR 121.  CT chest low dose screen 02/04/21 IMPRESSION: 1. Lung-RADS 1, negative. Continue annual screening with low-dose chest CT without contrast in 12 months. 2. Hepatic steatosis. 3. Coronary artery atherosclerosis. 4. Emphysema (ICD10-J43.9).   ROS-see HPI + = positive Constitutional:   No-   weight loss, night sweats, fevers, chills, fatigue, lassitude. HEENT:   No-  headaches, difficulty swallowing, tooth/dental problems, sore throat,       No-  sneezing, itching, ear ache, nasal congestion, post nasal drip,  CV:  No-   chest pain, orthopnea, PND, swelling in lower extremities, anasarca,                   dizziness, palpitations Resp: +  shortness of breath with exertion or at rest.               productive cough,  + non-productive cough,  No- coughing up of blood.          change in color of mucus.  No- wheezing.  +snores Skin: No-   rash or lesions. GI:  No-   heartburn, indigestion, abdominal pain, nausea, vomiting,  GU:  MS:  No-   joint pain or swelling.  . Neuro-  nothing unusual Psych:  No- change in mood or affect. No depression or anxiety.  No memory loss.  OBJ- Physical Exam General- Alert, Oriented, Affect-appropriate, Distress- none acute Skin-  Lymphadenopathy- none Head- atraumatic            Eyes- Gross vision intact, PERRLA, conjunctivae and secretions clear            Ears- Hearing,  canals-normal            Nose- Clear, no-Septal dev, mucus, polyps, erosion, perforation             Throat- Mallampati III-IV , mucosa clear , drainage- none, tonsils- atrophic Neck- flexible , trachea midline, no stridor , thyroid nl, carotid no bruit Chest - symmetrical excursion , unlabored           Heart/CV- RRR , no murmur , no gallop  , no rub, nl s1 s2                           - JVD- none , edema- none, stasis changes- none, varices- none           Lung-  distant, wheeze- none, cough+congested, dullness-none, rub- none           Chest wall-  Abd- tender-no, distended-no, bowel sounds-present, HSM- no Br/ Gen/ Rectal- Not done, not indicated Extrem- cyanosis- none, clubbing, none, atrophy- none, strength- nl,             Neuro-+ tremor hands and mild head-bob

## 2021-06-21 ENCOUNTER — Other Ambulatory Visit: Payer: Self-pay

## 2021-06-21 ENCOUNTER — Encounter: Payer: Self-pay | Admitting: Internal Medicine

## 2021-06-21 ENCOUNTER — Ambulatory Visit: Payer: BC Managed Care – PPO | Admitting: Internal Medicine

## 2021-06-21 VITALS — BP 130/82 | HR 95 | Temp 98.3°F | Ht 74.0 in | Wt 230.4 lb

## 2021-06-21 DIAGNOSIS — I2510 Atherosclerotic heart disease of native coronary artery without angina pectoris: Secondary | ICD-10-CM | POA: Diagnosis not present

## 2021-06-21 DIAGNOSIS — J9611 Chronic respiratory failure with hypoxia: Secondary | ICD-10-CM

## 2021-06-21 DIAGNOSIS — Z23 Encounter for immunization: Secondary | ICD-10-CM | POA: Diagnosis not present

## 2021-06-21 DIAGNOSIS — G4733 Obstructive sleep apnea (adult) (pediatric): Secondary | ICD-10-CM

## 2021-06-21 DIAGNOSIS — I2584 Coronary atherosclerosis due to calcified coronary lesion: Secondary | ICD-10-CM

## 2021-06-21 DIAGNOSIS — J449 Chronic obstructive pulmonary disease, unspecified: Secondary | ICD-10-CM

## 2021-06-21 NOTE — Patient Instructions (Addendum)
Order- O2 qualifying walk test- POC   dx COPD mixed type  Order-referral to Cardiology for coronary disease risk stratification  Please let us know if you have any problems with your CPAP when you get it this week.  Order- Flu vax standard

## 2021-06-21 NOTE — Assessment & Plan Note (Signed)
COPD, former smoker with DOE and known CAD.  Plan- cardiology referral for risk stratification

## 2021-06-21 NOTE — Assessment & Plan Note (Signed)
Has been using friend's machine on 10. Expects to get his own from [employer] (5-15) this week and will continue to bleed in O2 2L during sleep.  Plan- officee visit here in 31-90 day window

## 2021-06-22 ENCOUNTER — Encounter: Payer: Self-pay | Admitting: Internal Medicine

## 2021-06-22 NOTE — Assessment & Plan Note (Signed)
He will continue O2 for sleep, bleeding through his CPAP.

## 2021-06-22 NOTE — Assessment & Plan Note (Signed)
Stable but more aware of DOE. He maintains O2 saturation with brisk walk Plan continue current inhaled meds.

## 2021-06-29 ENCOUNTER — Ambulatory Visit (INDEPENDENT_AMBULATORY_CARE_PROVIDER_SITE_OTHER): Payer: BC Managed Care – PPO | Admitting: Ophthalmology

## 2021-06-29 ENCOUNTER — Other Ambulatory Visit: Payer: Self-pay

## 2021-06-29 ENCOUNTER — Encounter (INDEPENDENT_AMBULATORY_CARE_PROVIDER_SITE_OTHER): Payer: Self-pay | Admitting: Ophthalmology

## 2021-06-29 DIAGNOSIS — H353211 Exudative age-related macular degeneration, right eye, with active choroidal neovascularization: Secondary | ICD-10-CM | POA: Diagnosis not present

## 2021-06-29 DIAGNOSIS — G4733 Obstructive sleep apnea (adult) (pediatric): Secondary | ICD-10-CM | POA: Diagnosis not present

## 2021-06-29 DIAGNOSIS — H353221 Exudative age-related macular degeneration, left eye, with active choroidal neovascularization: Secondary | ICD-10-CM | POA: Diagnosis not present

## 2021-06-29 DIAGNOSIS — H353231 Exudative age-related macular degeneration, bilateral, with active choroidal neovascularization: Secondary | ICD-10-CM | POA: Diagnosis not present

## 2021-06-29 NOTE — Assessment & Plan Note (Addendum)
Patient reports officially now using CPAP for the last 4 weeks.  Due to supply chain issues and restrictions on access to equipment for 9 months  More recent months patient was using his own oxygen source at night, during which coincided day time of decreasing medication usage intravitreally  OD much less subretinal fluid in a 18-month follow-up , post most recent injection, and we will thus continue to observe. OS also with much less subretinal fluid, at 8 -month interval today OS much less, on no intravitreal therapy will continue to observe   Each of these findings are coincident with with now excellent compliance with nightly oxygen "all night" since November-December 2021.  Now since September 2022, on CPAP  in previous months he would not replace it if it was displaced during the night  He has been using it nightly throughout the entire night for the past 11 months

## 2021-06-29 NOTE — Progress Notes (Signed)
06/29/2021     CHIEF COMPLAINT Patient presents for  Chief Complaint  Patient presents with   Retina Follow Up      HISTORY OF PRESENT ILLNESS: [name] is a 65 y.o. male who presents to the clinic today for:   HPI     Retina Follow Up   Patient presents with  Wet AMD.  In both eyes.  This started 8 weeks ago.  Severity is mild.  Duration of 8 weeks.  Since onset it is stable.        Comments   8 week fu ou oct. Patient states vision is stable and unchanged since last visit. Denies any new floaters or FOL.       Last edited by [name] on 06/29/2021  8:01 AM.      Referring physician: Berniece Pap, FNP [address]  [place] [address]  HISTORICAL INFORMATION:   Selected notes from the MEDICAL RECORD NUMBER    Lab Results  Component Value Date   HGBA1C 5.4 08/31/2020     CURRENT MEDICATIONS: Current Outpatient Medications (Ophthalmic Drugs)  Medication Sig   Aflibercept 2 MG/0.05ML SOLN Inject 1 Dose into the eye every 8 (eight) weeks.    No current facility-administered medications for this visit. (Ophthalmic Drugs)   Current Outpatient Medications (Other)  Medication Sig   acetaminophen (TYLENOL) 500 MG tablet Take 500-1,000 mg by mouth every 6 (six) hours as needed (for pain.).   aspirin EC 81 MG tablet Take 81 mg by mouth daily.   diclofenac (VOLTAREN) 75 MG EC tablet TAKE 1 TABLET BY MOUTH TWICE A DAY   Fluticasone-Umeclidin-Vilant (TRELEGY ELLIPTA) 100-62.5-25 MCG/INH AEPB Inhale 1 puff into the lungs daily.   ipratropium-albuterol (DUONEB) 0.5-2.5 (3) MG/3ML SOLN INHALE THE CONTENTS OF 1 VIAL VIA NEBULIZER EVERY 6 HOURS AS NEEDED   ketoconazole (NIZORAL) 2 % cream SMARTSIG:1 Topical Every Night   ketoconazole (NIZORAL) 2 % shampoo Apply topically.   LINZESS 290 MCG CAPS capsule Take 290 mcg by mouth daily.   lisinopril (ZESTRIL) 20 MG tablet TAKE 1 TABLET BY MOUTH ONCE A DAY   Multiple Vitamin  (MULTIVITAMIN WITH MINERALS) TABS tablet Take 1 tablet by mouth daily.   pantoprazole (PROTONIX) 20 MG tablet Take 1 tablet (20 mg total) by mouth daily.   pravastatin (PRAVACHOL) 40 MG tablet Take 1 tablet (40 mg total) by mouth daily.   pregabalin (LYRICA) 100 MG capsule Take 1 capsule (100 mg total) by mouth 2 (two) times daily.   pregabalin (LYRICA) 75 MG capsule Take 1 capsule (75 mg total) by mouth 2 (two) times daily.   PROAIR HFA 108 (90 Base) MCG/ACT inhaler INHALE 2 PUFFS INTO THE LUNGS EVERY 6 HOURS AS NEEDED FOR WHEEZING ORSHORTNESS OF BREATH.   No current facility-administered medications for this visit. (Other)      REVIEW OF SYSTEMS:    ALLERGIES No Known Allergies  PAST MEDICAL HISTORY Past Medical History:  Diagnosis Date   COPD (chronic obstructive pulmonary disease) (HCC)    GERD (gastroesophageal reflux disease)    History of hepatitis C    treated several years ago through [facility]   Hypertension    Past Surgical History:  Procedure Laterality Date   CATARACT EXTRACTION, BILATERAL     EYE SURGERY Bilateral    ORTHOPEDIC SURGERY Bilateral    pt states fracture repairs to arms and legs.   REVERSE SHOULDER ARTHROPLASTY Left 09/23/2019   Procedure: REVERSE  SHOULDER ARTHROPLASTY;  Surgeon: Jones Broom, MD;  Location: [facility];  Service: Orthopedics;  Laterality: Left;    FAMILY HISTORY Family History  Problem Relation Age of Onset   Heart disease Mother    Hypertension Father     SOCIAL HISTORY Social History   Tobacco Use   Smoking status: Former    Packs/day: 1.00    Years: 35.00    Pack years: 35.00    Types: Cigarettes    Start date: 24    Quit date: 10/01/2010    Years since quitting: 10.7   Smokeless tobacco: Never  Vaping Use   Vaping Use: Never used  Substance Use Topics   Alcohol use: Yes    Alcohol/week: 42.0 standard drinks    Types: 42 Cans of beer per week    Comment: daily - 6 drinks a day   Drug use: Yes    Types:  Marijuana    Comment: last marijuana 2-3 weeks ago         OPHTHALMIC EXAM:  Base Eye Exam     Visual Acuity (ETDRS)       Right Left   Dist [place] 20/40 20/400   Dist ph [place] 20/30 20/100 +2         Tonometry (Tonopen, 8:03 AM)       Right Left   Pressure 21 15  Checked right eye twice, 21 both times.        Pupils       Pupils Dark Light Shape React APD   Right PERRL 3 2 Round Brisk None   Left PERRL 3 2 Round Brisk None         Extraocular Movement       Right Left    Full Full         Neuro/Psych     Oriented x3: Yes   Mood/Affect: Normal         Dilation     Both eyes: 1.0% Mydriacyl, 2.5% Phenylephrine @ 8:03 AM           Slit Lamp and Fundus Exam     External Exam       Right Left   External Normal Normal         Slit Lamp Exam       Right Left   Lids/Lashes Normal Normal   Conjunctiva/Sclera White and quiet White and quiet   Cornea Clear Clear   Anterior Chamber Deep and quiet Deep and quiet   Iris Round and reactive Round and reactive   Lens Posterior chamber intraocular lens Posterior chamber intraocular lens   Anterior Vitreous Normal Normal         Fundus Exam       Right Left   Posterior Vitreous Vitrectomized Normal   Disc Normal Normal   C/D Ratio 0.3 0.3   Macula Retinal pigment epithelial mottling, no disciform scar, no macular thickening, no hemorrhage Macular thickening, Retinal pigment epithelial mottling, Retinal pigment epithelial detachment, Epiretinal membrane, old serous rd inferior macula   Vessels Normal Normal   Periphery Normal, Good laser retinopexy temporally and inferonasal from prior retinal detachment repair Normal            IMAGING AND PROCEDURES  Imaging and Procedures for 06/29/21  OCT, Retina - OU - Both Eyes       Right Eye Quality was good. Scan locations included subfoveal. Central Foveal Thickness: 272. Progression has improved. Findings include abnormal foveal contour.  Left Eye Quality was good. Scan locations included subfoveal. Central Foveal Thickness: 261. Progression has improved. Findings include abnormal foveal contour, subretinal fluid.   Notes OD much less subretinal fluid in a 65-month follow-up , post most recent injection, and we will thus continue to observe. OS also with much less subretinal fluid, at 8 -month interval today OS much less, on no intravitreal therapy will continue to observe   Each of these findings are coincident with with now excellent compliance with nightly oxygen "all night" since November-December 2021.  Now since September 2022, on CPAP  in previous months he would not replace it if it was displaced during the night  He has been using it nightly throughout the entire night for the past 11 months               ASSESSMENT/PLAN:  Obstructive sleep apnea hypopnea, severe Patient reports officially now using CPAP for the last 4 weeks.  Due to supply chain issues and restrictions on access to equipment for 9 months  More recent months patient was using his own oxygen source at night, during which coincided day time of decreasing medication usage intravitreally  OD much less subretinal fluid in a 23-month follow-up , post most recent injection, and we will thus continue to observe. OS also with much less subretinal fluid, at 8 -month interval today OS much less, on no intravitreal therapy will continue to observe   Each of these findings are coincident with with now excellent compliance with nightly oxygen "all night" since November-December 2021.  Now since September 2022, on CPAP  in previous months he would not replace it if it was displaced during the night  He has been using it nightly throughout the entire night for the past 11 months  Exudative age-related macular degeneration of right eye with active choroidal neovascularization (HCC) OD much less subretinal fluid in a 92-month follow-up , post most  recent injection, and we will thus continue to observe. OS also with much less subretinal fluid, at 8 -month interval today OS much less, on no intravitreal therapy will continue to observe   Each of these findings are coincident with with now excellent compliance with nightly oxygen "all night" since November-December 2021.  Now since September 2022, on CPAP  in previous months he would not replace it if it was displaced during the night  He has been using it nightly throughout the entire night for the past 11 months  Exudative age-related macular degeneration of left eye with active choroidal neovascularization (HCC) OD much less subretinal fluid in a 45-month follow-up , post most recent injection, and we will thus continue to observe. OS also with much less subretinal fluid, at 8 -month interval today OS much less, on no intravitreal therapy will continue to observe   Each of these findings are coincident with with now excellent compliance with nightly oxygen "all night" since November-December 2021.  Now since September 2022, on CPAP  in previous months he would not replace it if it was displaced during the night  He has been using it nightly throughout the entire night for the past 11 months     ICD-10-CM   1. Exudative age-related macular degeneration of left eye with active choroidal neovascularization (HCC)  H35.3221 OCT, Retina - OU - Both Eyes    2. Exudative age-related macular degeneration of right eye with active choroidal neovascularization (HCC)  H35.3211 OCT, Retina - OU - Both Eyes    3. Obstructive sleep  apnea hypopnea, severe  G47.33       1.  OD persistent resolution of subretinal fluid as compared to 1 year ago and particularly no therapy required now for the last 7 months OD with antivegF.  This is coincident with nightly oxygen use that was promulgated and finally was consistently instituted by the patient at home, as he awaited the supply chain issues to acquire  CPAP machinery which has been done now for the last 1 month  2.  OS similarly persistent resolution of subretinal fluid yet visual acuity limited by subfoveal scarring from prior CNVM activity as recent as 9 and 12 months prior.  Condition left eye now also without treatment for approximately 8 months and coincident with the features of use of n nightly oxygen supplementation and now for the last month CPAP properly for his severe OSA  3.  I reviewed the findings with the patient today as well as the OCT, and have encouraged continued nightly therapy as he is doing as it is likely contributing to preventing nightly macular hypoxic and hypertensive stress from untreated OSA  Ophthalmic Meds Ordered this visit:  No orders of the defined types were placed in this encounter.      Return in about 3 months (around 09/29/2021) for DILATE OU, COLOR FP, OCT.  There are no Patient Instructions on file for this visit.   Explained the diagnoses, plan, and follow up with the patient and they expressed understanding.  Patient expressed understanding of the importance of proper follow up care.   Alford Highland Lema Heinkel M.D. Diseases & Surgery of the Retina and Vitreous [facility] 06/29/21     Abbreviations: M myopia (nearsighted); A astigmatism; H hyperopia (farsighted); P presbyopia; Mrx spectacle prescription;  CTL contact lenses; OD right eye; OS left eye; OU both eyes  XT exotropia; ET esotropia; PEK punctate epithelial keratitis; PEE punctate epithelial erosions; DES dry eye syndrome; MGD meibomian gland dysfunction; ATs artificial tears; PFAT's preservative free artificial tears; NSC nuclear sclerotic cataract; PSC posterior subcapsular cataract; ERM epi-retinal membrane; PVD posterior vitreous detachment; RD retinal detachment; DM diabetes mellitus; DR diabetic retinopathy; NPDR non-proliferative diabetic retinopathy; PDR proliferative diabetic retinopathy; CSME clinically significant  macular edema; DME diabetic macular edema; dbh dot blot hemorrhages; CWS cotton wool spot; POAG primary open angle glaucoma; C/D cup-to-disc ratio; HVF humphrey visual field; GVF goldmann visual field; OCT optical coherence tomography; IOP intraocular pressure; BRVO Branch retinal vein occlusion; CRVO central retinal vein occlusion; CRAO central retinal artery occlusion; BRAO branch retinal artery occlusion; RT retinal tear; SB scleral buckle; PPV pars plana vitrectomy; VH Vitreous hemorrhage; PRP panretinal laser photocoagulation; IVK intravitreal kenalog; VMT vitreomacular traction; MH Macular hole;  NVD neovascularization of the disc; NVE neovascularization elsewhere; AREDS age related eye disease study; ARMD age related macular degeneration; POAG primary open angle glaucoma; EBMD epithelial/anterior basement membrane dystrophy; ACIOL anterior chamber intraocular lens; IOL intraocular lens; PCIOL posterior chamber intraocular lens; Phaco/IOL phacoemulsification with intraocular lens placement; PRK photorefractive keratectomy; LASIK laser assisted in situ keratomileusis; HTN hypertension; DM diabetes mellitus; COPD chronic obstructive pulmonary disease

## 2021-06-29 NOTE — Assessment & Plan Note (Signed)
OD much less subretinal fluid in a 7-month follow-up , post most recent injection, and we will thus continue to observe. OS also with much less subretinal fluid, at 8 -month interval today OS much less, on no intravitreal therapy will continue to observe   Each of these findings are coincident with with now excellent compliance with nightly oxygen "all night" since November-December 2021.  Now since September 2022, on CPAP  in previous months he would not replace it if it was displaced during the night  He has been using it nightly throughout the entire night for the past 11 months 

## 2021-06-29 NOTE — Assessment & Plan Note (Signed)
OD much less subretinal fluid in a 66-month follow-up , post most recent injection, and we will thus continue to observe. OS also with much less subretinal fluid, at 8 -month interval today OS much less, on no intravitreal therapy will continue to observe   Each of these findings are coincident with with now excellent compliance with nightly oxygen "all night" since November-December 2021.  Now since September 2022, on CPAP  in previous months he would not replace it if it was displaced during the night  He has been using it nightly throughout the entire night for the past 11 months

## 2021-07-26 ENCOUNTER — Ambulatory Visit: Payer: 59 | Admitting: Internal Medicine

## 2021-07-27 ENCOUNTER — Inpatient Hospital Stay (HOSPITAL_COMMUNITY)
Admission: EM | Admit: 2021-07-27 | Discharge: 2021-08-30 | DRG: 003 | Disposition: A | Discharge status: Other Acute Inpatient Hospital | Payer: BC Managed Care – PPO | Attending: General Surgery | Admitting: General Surgery

## 2021-07-27 ENCOUNTER — Inpatient Hospital Stay (HOSPITAL_COMMUNITY): Payer: BC Managed Care – PPO | Admitting: Anesthesiology

## 2021-07-27 ENCOUNTER — Emergency Department (HOSPITAL_COMMUNITY): Payer: BC Managed Care – PPO

## 2021-07-27 ENCOUNTER — Encounter (HOSPITAL_COMMUNITY): Payer: Self-pay | Admitting: Radiology

## 2021-07-27 ENCOUNTER — Other Ambulatory Visit: Payer: Self-pay

## 2021-07-27 DIAGNOSIS — E876 Hypokalemia: Secondary | ICD-10-CM | POA: Diagnosis not present

## 2021-07-27 DIAGNOSIS — J942 Hemothorax: Secondary | ICD-10-CM

## 2021-07-27 DIAGNOSIS — R579 Shock, unspecified: Secondary | ICD-10-CM | POA: Diagnosis present

## 2021-07-27 DIAGNOSIS — I10 Essential (primary) hypertension: Secondary | ICD-10-CM | POA: Diagnosis present

## 2021-07-27 DIAGNOSIS — Z8249 Family history of ischemic heart disease and other diseases of the circulatory system: Secondary | ICD-10-CM | POA: Diagnosis not present

## 2021-07-27 DIAGNOSIS — B1920 Unspecified viral hepatitis C without hepatic coma: Secondary | ICD-10-CM | POA: Diagnosis present

## 2021-07-27 DIAGNOSIS — J9601 Acute respiratory failure with hypoxia: Secondary | ICD-10-CM | POA: Diagnosis present

## 2021-07-27 DIAGNOSIS — F064 Anxiety disorder due to known physiological condition: Secondary | ICD-10-CM | POA: Diagnosis present

## 2021-07-27 DIAGNOSIS — E872 Acidosis, unspecified: Secondary | ICD-10-CM | POA: Diagnosis present

## 2021-07-27 DIAGNOSIS — Z87891 Personal history of nicotine dependence: Secondary | ICD-10-CM | POA: Diagnosis not present

## 2021-07-27 DIAGNOSIS — R0902 Hypoxemia: Secondary | ICD-10-CM

## 2021-07-27 DIAGNOSIS — E875 Hyperkalemia: Secondary | ICD-10-CM | POA: Diagnosis not present

## 2021-07-27 DIAGNOSIS — E785 Hyperlipidemia, unspecified: Secondary | ICD-10-CM | POA: Diagnosis present

## 2021-07-27 DIAGNOSIS — M898X9 Other specified disorders of bone, unspecified site: Secondary | ICD-10-CM | POA: Diagnosis present

## 2021-07-27 DIAGNOSIS — J9690 Respiratory failure, unspecified, unspecified whether with hypoxia or hypercapnia: Secondary | ICD-10-CM

## 2021-07-27 DIAGNOSIS — R451 Restlessness and agitation: Secondary | ICD-10-CM | POA: Diagnosis not present

## 2021-07-27 DIAGNOSIS — R339 Retention of urine, unspecified: Secondary | ICD-10-CM | POA: Diagnosis not present

## 2021-07-27 DIAGNOSIS — Z01818 Encounter for other preprocedural examination: Secondary | ICD-10-CM

## 2021-07-27 DIAGNOSIS — F05 Delirium due to known physiological condition: Secondary | ICD-10-CM | POA: Diagnosis not present

## 2021-07-27 DIAGNOSIS — J189 Pneumonia, unspecified organism: Secondary | ICD-10-CM | POA: Diagnosis present

## 2021-07-27 DIAGNOSIS — Z20822 Contact with and (suspected) exposure to covid-19: Principal | ICD-10-CM | POA: Diagnosis present

## 2021-07-27 DIAGNOSIS — J9621 Acute and chronic respiratory failure with hypoxia: Secondary | ICD-10-CM | POA: Diagnosis not present

## 2021-07-27 DIAGNOSIS — S2241XA Multiple fractures of ribs, right side, initial encounter for closed fracture: Principal | ICD-10-CM | POA: Diagnosis present

## 2021-07-27 DIAGNOSIS — R739 Hyperglycemia, unspecified: Secondary | ICD-10-CM | POA: Diagnosis present

## 2021-07-27 DIAGNOSIS — K219 Gastro-esophageal reflux disease without esophagitis: Secondary | ICD-10-CM | POA: Diagnosis present

## 2021-07-27 DIAGNOSIS — F1011 Alcohol abuse, in remission: Secondary | ICD-10-CM | POA: Diagnosis not present

## 2021-07-27 DIAGNOSIS — D696 Thrombocytopenia, unspecified: Secondary | ICD-10-CM | POA: Diagnosis present

## 2021-07-27 DIAGNOSIS — Z4659 Encounter for fitting and adjustment of other gastrointestinal appliance and device: Secondary | ICD-10-CM

## 2021-07-27 DIAGNOSIS — E8770 Fluid overload, unspecified: Secondary | ICD-10-CM | POA: Diagnosis present

## 2021-07-27 DIAGNOSIS — Z419 Encounter for procedure for purposes other than remedying health state, unspecified: Secondary | ICD-10-CM

## 2021-07-27 DIAGNOSIS — W11XXXA Fall on and from ladder, initial encounter: Secondary | ICD-10-CM | POA: Diagnosis present

## 2021-07-27 DIAGNOSIS — J939 Pneumothorax, unspecified: Secondary | ICD-10-CM

## 2021-07-27 DIAGNOSIS — Z4682 Encounter for fitting and adjustment of non-vascular catheter: Secondary | ICD-10-CM

## 2021-07-27 DIAGNOSIS — S32810A Multiple fractures of pelvis with stable disruption of pelvic ring, initial encounter for closed fracture: Secondary | ICD-10-CM

## 2021-07-27 DIAGNOSIS — J159 Unspecified bacterial pneumonia: Secondary | ICD-10-CM | POA: Diagnosis not present

## 2021-07-27 DIAGNOSIS — T148XXA Other injury of unspecified body region, initial encounter: Secondary | ICD-10-CM

## 2021-07-27 DIAGNOSIS — Z978 Presence of other specified devices: Secondary | ICD-10-CM

## 2021-07-27 DIAGNOSIS — Z96612 Presence of left artificial shoulder joint: Secondary | ICD-10-CM | POA: Diagnosis present

## 2021-07-27 DIAGNOSIS — T1490XA Injury, unspecified, initial encounter: Secondary | ICD-10-CM

## 2021-07-27 DIAGNOSIS — F1010 Alcohol abuse, uncomplicated: Secondary | ICD-10-CM | POA: Diagnosis present

## 2021-07-27 DIAGNOSIS — D62 Acute posthemorrhagic anemia: Secondary | ICD-10-CM | POA: Diagnosis not present

## 2021-07-27 DIAGNOSIS — S270XXA Traumatic pneumothorax, initial encounter: Secondary | ICD-10-CM | POA: Diagnosis present

## 2021-07-27 DIAGNOSIS — W19XXXA Unspecified fall, initial encounter: Secondary | ICD-10-CM | POA: Diagnosis present

## 2021-07-27 DIAGNOSIS — R319 Hematuria, unspecified: Secondary | ICD-10-CM | POA: Diagnosis not present

## 2021-07-27 DIAGNOSIS — J988 Other specified respiratory disorders: Secondary | ICD-10-CM

## 2021-07-27 DIAGNOSIS — S32811A Multiple fractures of pelvis with unstable disruption of pelvic ring, initial encounter for closed fracture: Secondary | ICD-10-CM | POA: Diagnosis present

## 2021-07-27 DIAGNOSIS — J90 Pleural effusion, not elsewhere classified: Secondary | ICD-10-CM | POA: Diagnosis not present

## 2021-07-27 DIAGNOSIS — J440 Chronic obstructive pulmonary disease with acute lower respiratory infection: Secondary | ICD-10-CM | POA: Diagnosis present

## 2021-07-27 DIAGNOSIS — Z930 Tracheostomy status: Secondary | ICD-10-CM

## 2021-07-27 DIAGNOSIS — E559 Vitamin D deficiency, unspecified: Secondary | ICD-10-CM | POA: Diagnosis present

## 2021-07-27 LAB — LACTIC ACID, PLASMA: Lactic Acid, Venous: 4.7 mmol/L (ref 0.5–1.9)

## 2021-07-27 LAB — CBC
HCT: 44.2 % (ref 39.0–52.0)
Hemoglobin: 14.6 g/dL (ref 13.0–17.0)
MCH: 34.4 pg — ABNORMAL HIGH (ref 26.0–34.0)
MCHC: 33 g/dL (ref 30.0–36.0)
MCV: 104 fL — ABNORMAL HIGH (ref 80.0–100.0)
Platelets: 223 10*3/uL (ref 150–400)
RBC: 4.25 MIL/uL (ref 4.22–5.81)
RDW: 12.1 % (ref 11.5–15.5)
WBC: 15.6 10*3/uL — ABNORMAL HIGH (ref 4.0–10.5)
nRBC: 0.3 % — ABNORMAL HIGH (ref 0.0–0.2)

## 2021-07-27 LAB — COMPREHENSIVE METABOLIC PANEL
ALT: 89 U/L — ABNORMAL HIGH (ref 0–44)
AST: 114 U/L — ABNORMAL HIGH (ref 15–41)
Albumin: 4.5 g/dL (ref 3.5–5.0)
Alkaline Phosphatase: 78 U/L (ref 38–126)
Anion gap: 13 (ref 5–15)
BUN: 20 mg/dL (ref 8–23)
CO2: 20 mmol/L — ABNORMAL LOW (ref 22–32)
Calcium: 8.8 mg/dL — ABNORMAL LOW (ref 8.9–10.3)
Chloride: 106 mmol/L (ref 98–111)
Creatinine, Ser: 1.38 mg/dL — ABNORMAL HIGH (ref 0.61–1.24)
GFR, Estimated: 57 mL/min — ABNORMAL LOW (ref >60)
Glucose, Bld: 176 mg/dL — ABNORMAL HIGH (ref 70–99)
Potassium: 4.4 mmol/L (ref 3.5–5.1)
Sodium: 139 mmol/L (ref 135–145)
Total Bilirubin: 0.9 mg/dL (ref 0.3–1.2)
Total Protein: 8.1 g/dL (ref 6.5–8.1)

## 2021-07-27 LAB — I-STAT CHEM 8, ED
BUN: 26 mg/dL — ABNORMAL HIGH (ref 8–23)
Calcium, Ion: 1.11 mmol/L — ABNORMAL LOW (ref 1.15–1.40)
Chloride: 109 mmol/L (ref 98–111)
Creatinine, Ser: 1.3 mg/dL — ABNORMAL HIGH (ref 0.61–1.24)
Glucose, Bld: 172 mg/dL — ABNORMAL HIGH (ref 70–99)
HCT: 45 % (ref 39.0–52.0)
Hemoglobin: 15.3 g/dL (ref 13.0–17.0)
Potassium: 4.5 mmol/L (ref 3.5–5.1)
Sodium: 140 mmol/L (ref 135–145)
TCO2: 22 mmol/L (ref 22–32)

## 2021-07-27 LAB — ETHANOL: Alcohol, Ethyl (B): <10 mg/dL (ref <10)

## 2021-07-27 LAB — RESP PANEL BY RT-PCR (FLU A&B, COVID) ARPGX2
Influenza A by PCR: NEGATIVE
Influenza B by PCR: NEGATIVE
SARS Coronavirus 2 by RT PCR: NEGATIVE

## 2021-07-27 LAB — SAMPLE TO BLOOD BANK

## 2021-07-27 LAB — PROTIME-INR
INR: 1.1 (ref 0.8–1.2)
Prothrombin Time: 14.4 seconds (ref 11.4–15.2)

## 2021-07-27 LAB — SURGICAL PCR SCREEN
MRSA, PCR: NEGATIVE
Staphylococcus aureus: NEGATIVE

## 2021-07-27 MED ORDER — POVIDONE-IODINE 10 % EX SWAB: 2.0000 "application " | Freq: Once | CUTANEOUS | Status: DC

## 2021-07-27 MED ORDER — MORPHINE SULFATE (PF) 4 MG/ML IV SOLN
INTRAVENOUS | Status: AC
  Administered 2021-07-27: 4 mg via INTRAVENOUS
  Filled 2021-07-27: qty 1

## 2021-07-27 MED ORDER — IPRATROPIUM-ALBUTEROL 0.5-2.5 (3) MG/3ML IN SOLN
3.0000 mL | RESPIRATORY_TRACT | Status: AC
  Administered 2021-07-27 – 2021-07-30 (×15): 3 mL via RESPIRATORY_TRACT
  Filled 2021-07-27 (×16): qty 3

## 2021-07-27 MED ORDER — LIDOCAINE 5 % EX PTCH
1.0000 | MEDICATED_PATCH | CUTANEOUS | Status: DC
  Administered 2021-07-27 – 2021-08-29 (×31): 1 via TRANSDERMAL
  Filled 2021-07-27 (×32): qty 1

## 2021-07-27 MED ORDER — KETAMINE HCL 50 MG/5ML IJ SOSY
PREFILLED_SYRINGE | INTRAMUSCULAR | Status: AC
  Administered 2021-07-27: 20 mg
  Filled 2021-07-27: qty 5

## 2021-07-27 MED ORDER — IOHEXOL 300 MG/ML  SOLN
100.0000 mL | Freq: Once | INTRAMUSCULAR | Status: AC | PRN
  Administered 2021-07-27: 100 mL via INTRAVENOUS

## 2021-07-27 MED ORDER — LACTATED RINGERS IV SOLN: INTRAVENOUS | Status: DC

## 2021-07-27 MED ORDER — LORAZEPAM 2 MG/ML IJ SOLN: 1.0000 mg | INTRAMUSCULAR | Status: DC | PRN

## 2021-07-27 MED ORDER — ENOXAPARIN SODIUM 40 MG/0.4ML IJ SOSY
40.0000 mg | PREFILLED_SYRINGE | Freq: Two times a day (BID) | INTRAMUSCULAR | Status: DC
  Administered 2021-07-28 – 2021-07-30 (×3): 40 mg via SUBCUTANEOUS
  Filled 2021-07-27 (×4): qty 0.4

## 2021-07-27 MED ORDER — KETAMINE HCL 50 MG/5ML IJ SOSY: 20.0000 mg | PREFILLED_SYRINGE | Freq: Once | INTRAMUSCULAR | Status: AC

## 2021-07-27 MED ORDER — CHLORHEXIDINE GLUCONATE CLOTH 2 % EX PADS
6.0000 | MEDICATED_PAD | Freq: Every day | CUTANEOUS | Status: DC
  Administered 2021-07-27 – 2021-08-03 (×9): 6 via TOPICAL

## 2021-07-27 MED ORDER — OXYCODONE HCL 5 MG PO TABS
5.0000 mg | ORAL_TABLET | ORAL | Status: DC | PRN
Start: 2021-07-27 — End: 2021-07-27
  Administered 2021-07-27: 10 mg via ORAL
  Filled 2021-07-27: qty 2

## 2021-07-27 MED ORDER — MELATONIN 3 MG PO TABS
3.0000 mg | ORAL_TABLET | Freq: Every evening | ORAL | Status: DC | PRN
  Filled 2021-07-27: qty 1

## 2021-07-27 MED ORDER — CHLORHEXIDINE GLUCONATE 4 % EX LIQD
60.0000 mL | Freq: Once | CUTANEOUS | Status: AC
  Administered 2021-07-28: 4 via TOPICAL
  Filled 2021-07-27: qty 60

## 2021-07-27 MED ORDER — ACETAMINOPHEN 500 MG PO TABS
1000.0000 mg | ORAL_TABLET | Freq: Four times a day (QID) | ORAL | Status: DC
  Administered 2021-07-27 – 2021-07-28 (×4): 1000 mg via ORAL
  Filled 2021-07-27 (×4): qty 2

## 2021-07-27 MED ORDER — ADULT MULTIVITAMIN W/MINERALS CH
1.0000 | ORAL_TABLET | Freq: Every day | ORAL | Status: DC
  Administered 2021-07-27 – 2021-07-28 (×2): 1 via ORAL
  Filled 2021-07-27 (×2): qty 1

## 2021-07-27 MED ORDER — LORAZEPAM 1 MG PO TABS: 1.0000 mg | ORAL_TABLET | ORAL | Status: DC | PRN

## 2021-07-27 MED ORDER — MIDAZOLAM HCL 2 MG/2ML IJ SOLN
INTRAMUSCULAR | Status: AC
  Filled 2021-07-27: qty 2

## 2021-07-27 MED ORDER — CEFAZOLIN SODIUM-DEXTROSE 2-4 GM/100ML-% IV SOLN
2.0000 g | INTRAVENOUS | Status: DC
  Filled 2021-07-27: qty 100

## 2021-07-27 MED ORDER — MORPHINE SULFATE (PF) 4 MG/ML IV SOLN: 4.0000 mg | Freq: Once | INTRAVENOUS | Status: AC

## 2021-07-27 MED ORDER — DOCUSATE SODIUM 100 MG PO CAPS
100.0000 mg | ORAL_CAPSULE | Freq: Two times a day (BID) | ORAL | Status: DC
  Administered 2021-07-27 – 2021-07-28 (×2): 100 mg via ORAL
  Filled 2021-07-27 (×2): qty 1

## 2021-07-27 MED ORDER — MUPIROCIN 2 % EX OINT: 1.0000 "application " | TOPICAL_OINTMENT | Freq: Two times a day (BID) | CUTANEOUS | Status: DC

## 2021-07-27 MED ORDER — METOPROLOL TARTRATE 5 MG/5ML IV SOLN
5.0000 mg | Freq: Four times a day (QID) | INTRAVENOUS | Status: AC | PRN
  Administered 2021-08-01 – 2021-08-02 (×4): 5 mg via INTRAVENOUS
  Filled 2021-07-27 (×4): qty 5

## 2021-07-27 MED ORDER — FOLIC ACID 1 MG PO TABS
1.0000 mg | ORAL_TABLET | Freq: Every day | ORAL | Status: DC
  Administered 2021-07-27 – 2021-07-28 (×2): 1 mg via ORAL
  Filled 2021-07-27 (×2): qty 1

## 2021-07-27 MED ORDER — FENTANYL CITRATE (PF) 100 MCG/2ML IJ SOLN
INTRAMUSCULAR | Status: AC
  Administered 2021-07-27: 100 ug
  Filled 2021-07-27: qty 2

## 2021-07-27 MED ORDER — SPIRITUS FRUMENTI
1.0000 | Freq: Every day | ORAL | Status: DC
  Filled 2021-07-27 (×4): qty 1

## 2021-07-27 MED ORDER — ORAL CARE MOUTH RINSE
15.0000 mL | Freq: Two times a day (BID) | OROMUCOSAL | Status: DC
  Administered 2021-07-28: 15 mL via OROMUCOSAL

## 2021-07-27 MED ORDER — METHOCARBAMOL 500 MG PO TABS
1000.0000 mg | ORAL_TABLET | Freq: Three times a day (TID) | ORAL | Status: DC
  Administered 2021-07-27 – 2021-07-28 (×4): 1000 mg via ORAL
  Filled 2021-07-27 (×5): qty 2

## 2021-07-27 MED ORDER — IPRATROPIUM-ALBUTEROL 0.5-2.5 (3) MG/3ML IN SOLN
3.0000 mL | Freq: Four times a day (QID) | RESPIRATORY_TRACT | Status: DC | PRN
  Administered 2021-08-02: 3 mL via RESPIRATORY_TRACT
  Filled 2021-07-27: qty 3

## 2021-07-27 MED ORDER — OXYCODONE HCL 5 MG PO TABS
5.0000 mg | ORAL_TABLET | ORAL | Status: DC | PRN
  Administered 2021-07-27 – 2021-07-28 (×4): 10 mg via ORAL
  Filled 2021-07-27 (×4): qty 2

## 2021-07-27 MED ORDER — HYDROMORPHONE HCL 1 MG/ML IJ SOLN
1.0000 mg | INTRAMUSCULAR | Status: DC | PRN
Start: 2021-07-27 — End: 2021-08-30
  Administered 2021-07-27 – 2021-07-28 (×8): 2 mg via INTRAVENOUS
  Administered 2021-08-10 (×2): 1 mg via INTRAVENOUS
  Administered 2021-08-11 – 2021-08-22 (×9): 2 mg via INTRAVENOUS
  Administered 2021-08-24: 1 mg via INTRAVENOUS
  Filled 2021-07-27 (×14): qty 2
  Filled 2021-07-27: qty 1
  Filled 2021-07-27: qty 2
  Filled 2021-07-27: qty 1
  Filled 2021-07-27: qty 2
  Filled 2021-07-27: qty 1
  Filled 2021-07-27 (×2): qty 2

## 2021-07-27 MED ORDER — LACTATED RINGERS IV BOLUS
1000.0000 mL | Freq: Once | INTRAVENOUS | Status: AC
  Administered 2021-07-27: 1000 mL via INTRAVENOUS

## 2021-07-27 MED ORDER — THIAMINE HCL 100 MG/ML IJ SOLN
100.0000 mg | Freq: Every day | INTRAMUSCULAR | Status: DC
  Administered 2021-07-28: 100 mg via INTRAVENOUS
  Filled 2021-07-27: qty 2

## 2021-07-27 MED ORDER — ONDANSETRON 4 MG PO TBDP: 4.0000 mg | ORAL_TABLET | Freq: Four times a day (QID) | ORAL | Status: DC | PRN

## 2021-07-27 MED ORDER — GABAPENTIN 300 MG PO CAPS
300.0000 mg | ORAL_CAPSULE | Freq: Three times a day (TID) | ORAL | Status: DC
  Filled 2021-07-27: qty 1

## 2021-07-27 MED ORDER — THIAMINE HCL 100 MG PO TABS
100.0000 mg | ORAL_TABLET | Freq: Every day | ORAL | Status: DC
  Administered 2021-07-27: 100 mg via ORAL
  Filled 2021-07-27: qty 1

## 2021-07-27 MED ORDER — MORPHINE SULFATE (PF) 2 MG/ML IV SOLN
1.0000 mg | INTRAVENOUS | Status: DC | PRN
  Administered 2021-07-27: 4 mg via INTRAVENOUS
  Filled 2021-07-27: qty 2

## 2021-07-27 MED ORDER — ONDANSETRON HCL 4 MG/2ML IJ SOLN
4.0000 mg | Freq: Four times a day (QID) | INTRAMUSCULAR | Status: DC | PRN
  Administered 2021-07-27: 4 mg via INTRAVENOUS
  Filled 2021-07-27: qty 2

## 2021-07-27 NOTE — ED Triage Notes (Signed)
Pt via EMS after falling off of eight foot ladder. Fall was caused by a pipe on which he was working bursting and causing him to fall backwards. Pt fell onto back without LOC. Denies LOC. Abrasion to R head and R elbow. Diminished to absent lung sounds on the R, with obvious deformity. Arrives on NRB 15 L, after being hypoxic to 85% and appeared to be cyanotic on EMS arrival.

## 2021-07-27 NOTE — Progress Notes (Signed)
Orthopedic Tech Progress Note Patient Details:  [name] [birth year] [id number]  Level 2 trauma  Patient ID: [name], male   DOB: 10/17/1955, 65 y.o.   MRN: [id number]  Donald Pore 07/27/2021, 4:51 PM

## 2021-07-27 NOTE — Progress Notes (Signed)
Responded to ED page to support patient that fell off eight  foot ladder while working on pipe that burst causing the fall.  Patient is talking with staff.  Provided emotional support and ministry of presence.  Chaplain available as needed.  Venida Jarvis, [place], B [facility], Pager [phone]

## 2021-07-27 NOTE — Anesthesia Preprocedure Evaluation (Deleted)
Anesthesia Evaluation  Patient identified by MRN, date of birth, ID band Patient awake    Reviewed: Allergy & Precautions, NPO status , Patient's Chart, lab work & pertinent test results  Airway Mallampati: II  TM Distance: >3 FB Neck ROM: Full    Dental  (+) Chipped, Missing   Pulmonary sleep apnea , COPD, former smoker,  Right pneumothorax with chest tube in place On 10L O2   Pulmonary exam normal breath sounds clear to auscultation       Cardiovascular Exercise Tolerance: Good hypertension, + CAD  Normal cardiovascular exam Rhythm:Regular Rate:Normal     Neuro/Psych negative neurological ROS  negative psych ROS   GI/Hepatic GERD  ,(+)     substance abuse  alcohol use and marijuana use,   Endo/Other  negative endocrine ROS  Renal/GU negative Renal ROS  negative genitourinary   Musculoskeletal   Abdominal   Peds negative pediatric ROS (+)  Hematology  (+) anemia ,   Anesthesia Other Findings Right chest tube Poly trauma (7 rib fractures, SI fracture)  Reproductive/Obstetrics                          Anesthesia Physical Anesthesia Plan  ASA: 3  Anesthesia Plan: General   Post-op Pain Management:    Induction: Intravenous  PONV Risk Score and Plan: 2  Airway Management Planned: Oral ETT  Additional Equipment: None  Intra-op Plan:   Post-operative Plan: Extubation in OR  Informed Consent: I have reviewed the patients History and Physical, chart, labs and discussed the procedure including the risks, benefits and alternatives for the proposed anesthesia with the patient or authorized representative who has indicated his/her understanding and acceptance.     Dental advisory given  Plan Discussed with: CRNA, Anesthesiologist and Surgeon  Anesthesia Plan Comments: (Very hyperkalemic. Needs repeat K+ and treatment if truly elevated >6.0. GETA. Tanna Furry, MD  )      Anesthesia Quick Evaluation

## 2021-07-27 NOTE — ED Notes (Addendum)
Trauma Response Nurse Note-  Reason for Call / Reason for Trauma activation:   - L2 fall from ladder  Initial Focused Assessment (If applicable, or please see trauma documentation):  - GCS 15 - R rib pain - SOB - hypertensive SBP 190's.  - 18G PIV L AC - Abrasion to R head - no LOC -100% NRB  Interventions:  - Chest tube placed (right side to 20cm suction) - C-collar placed - 18G PIV placed to R FA - Fentanyl given - morphine given - CXR - CT pan scan - Placed on HFNC @ 10L - Took pt to 4NICU - Spoke with pt's daughter and sister  Plan of Care as of this note:  - Admit to 4NICU for observation - UA / UDS pending (notified 4N RN this is needed) - Pain control - Surgery for pelvis in am   Event Summary:   - Pt was at work and a pipe exploded resulting in him falling off of ladder approximately 31ft onto right side and back. Pt did hit head but had no LOC.  Pt came in without c-collar but c-collar was placed in ED.  CXR done. Pelvic XR deferred per EDP at this time.  Chest tube placed on the right side and was placed onto 20cm of H20.  Dr. Bedelia Person was notified @ 1551 and came to pt's bedside @ 1605 while we were in CT.  TRN [employer] [phone]

## 2021-07-27 NOTE — Progress Notes (Signed)
Trauma Response Nurse Note-  Reason for Call / Reason for Trauma activation:   - notified by primary RN Nehemiah Settle regarding excruciating pain, unrelieved by morphine q2h PRN pushes and oxycodone IR q4h  Notified Dr. Fredricka Bonine trauma attending on call regarding pain, PRN changed to dilaudid, increased frequency of oxy IR and added gabapentin. Brooke Charity fundraiser notified. Please call TRN for further assistance. [phone]

## 2021-07-27 NOTE — ED Provider Notes (Addendum)
[facility] EMERGENCY DEPARTMENT Provider Note   CSN: [id number] Arrival date & time: 07/27/21  1531     History Chief Complaint  Patient presents with   [name] is a 65 y.o. male.  65 yo M with a chief complaints of right-sided chest wall pain after a fall from a ladder.  Patient was approximately 6 feet in the air.  Complaining of severe pain and difficulty breathing upon EMS arrival.  Found to have absent lung sounds on the right side and an oxygen saturation in the 70s.  Was placed on nonrebreather and transported to the ED.  They reported seeing some deformity to the right posterior rib angles and felt like he had paroxysmal chest wall movement.  Patient denies headache neck pain abdominal pain extremity pain.  Tells me that he has broken his ribs multiple times in the past and thinks this feels the same.  The history is provided by the patient.  Fall This is a new problem. The current episode started 6 to 12 hours ago. The problem occurs constantly. The problem has not changed since onset.Associated symptoms include chest pain and shortness of breath. Pertinent negatives include no abdominal pain and no headaches. Nothing aggravates the symptoms. Nothing relieves the symptoms. He has tried nothing for the symptoms. The treatment provided no relief.      Past Medical History:  Diagnosis Date   COPD (chronic obstructive pulmonary disease) ([place])    GERD (gastroesophageal reflux disease)    History of hepatitis C    treated several years ago through [facility]   Hypertension     Patient Active Problem List   Diagnosis Date Noted   Fall 07/27/2021   Screening for prostate cancer 01/27/2021   Elevated LFTs 01/27/2021   Former smoker 01/27/2021   Chronic respiratory failure with hypoxia ([place]) 11/08/2020   Obstructive sleep apnea hypopnea, severe 09/24/2020   Exudative age-related macular degeneration of left eye with active choroidal  neovascularization ([place]) 01/28/2020   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   Exudative age-related macular degeneration of right eye with active choroidal neovascularization ([place]) 01/28/2020   CAD (coronary artery disease) 10/16/2019   S/P reverse total shoulder arthroplasty, left 09/23/2019   Overweight (BMI 25.0-29.9) 08/13/2019   Hyperlipidemia 08/13/2019   Essential hypertension 08/13/2019   Gastroesophageal reflux disease 08/13/2019   Macular degeneration, wet ([place]) 11/09/2017   Lung nodules 10/28/2015   COPD mixed type ([place]) 10/01/2014   Chest x-ray abnormality 10/01/2014    Past Surgical History:  Procedure Laterality Date   CATARACT EXTRACTION, BILATERAL     EYE SURGERY Bilateral    ORTHOPEDIC SURGERY Bilateral    pt states fracture repairs to arms and legs.   REVERSE SHOULDER ARTHROPLASTY Left 09/23/2019   Procedure: REVERSE SHOULDER ARTHROPLASTY;  Surgeon: Tania Ade, MD;  Location: [facility];  Service: Orthopedics;  Laterality: Left;       Family History  Problem Relation Age of Onset   Heart disease Mother    Hypertension Father     Social History   Tobacco Use   Smoking status: Former    Packs/day: 1.00    Years: 35.00    Pack years: 35.00    Types: Cigarettes    Start date: 28    Quit date: 10/01/2010    Years since quitting: 10.8   Smokeless tobacco: Never  Vaping Use   Vaping Use: Never used  Substance Use Topics   Alcohol  use: Yes    Alcohol/week: 42.0 standard drinks    Types: 42 Cans of beer per week    Comment: daily - 6 drinks a day   Drug use: Yes    Types: Marijuana    Comment: last marijuana 2-3 weeks ago    Home Medications Prior to Admission medications   Medication Sig Start Date End Date Taking? Authorizing Provider  acetaminophen (TYLENOL) 500 MG tablet Take 500-1,000 mg by mouth every 6 (six) hours as needed (for pain.).    [provider]  Aflibercept 2 MG/0.05ML SOLN Inject 1 Dose into the eye  every 8 (eight) weeks.     [provider]  aspirin EC 81 MG tablet Take 81 mg by mouth daily.    [provider]  diclofenac (VOLTAREN) 75 MG EC tablet TAKE 1 TABLET BY MOUTH TWICE A DAY 04/21/21   Wallene Huh, DPM  Fluticasone-Umeclidin-Vilant (TRELEGY ELLIPTA) 100-62.5-25 MCG/INH AEPB Inhale 1 puff into the lungs daily. 11/11/20   Baird Lyons D, MD  ipratropium-albuterol (DUONEB) 0.5-2.5 (3) MG/3ML SOLN INHALE THE CONTENTS OF 1 VIAL VIA NEBULIZER EVERY 6 HOURS AS NEEDED 02/03/21   Magdalen Spatz, NP  ketoconazole (NIZORAL) 2 % cream SMARTSIG:1 Topical Every Night 12/18/20   [provider]  ketoconazole (NIZORAL) 2 % shampoo Apply topically. 12/17/20   [provider]  LINZESS 290 MCG CAPS capsule Take 290 mcg by mouth daily. 01/26/21   [provider]  lisinopril (ZESTRIL) 20 MG tablet TAKE 1 TABLET BY MOUTH ONCE A DAY 03/18/21   Flinchum, Kelby Aline, FNP  Multiple Vitamin (MULTIVITAMIN WITH MINERALS) TABS tablet Take 1 tablet by mouth daily.    [provider]  pantoprazole (PROTONIX) 20 MG tablet Take 1 tablet (20 mg total) by mouth daily. 04/20/21   Flinchum, Kelby Aline, FNP  pravastatin (PRAVACHOL) 40 MG tablet Take 1 tablet (40 mg total) by mouth daily. 09/01/20   Elby Beck, FNP  pregabalin (LYRICA) 100 MG capsule Take 1 capsule (100 mg total) by mouth 2 (two) times daily. 01/25/21   Wallene Huh, DPM  pregabalin (LYRICA) 75 MG capsule Take 1 capsule (75 mg total) by mouth 2 (two) times daily. 01/22/21   Stover, Titorya, DPM  PROAIR HFA 108 (90 Base) MCG/ACT inhaler INHALE 2 PUFFS INTO THE LUNGS EVERY 6 HOURS AS NEEDED FOR WHEEZING ORSHORTNESS OF BREATH. 10/16/19   Deneise Lever, MD    Allergies    Patient has no known allergies.  Review of Systems   Review of Systems  Constitutional:  Negative for chills and fever.  HENT:  Negative for congestion and facial swelling.   Eyes:  Negative for discharge and visual disturbance.   Respiratory:  Positive for shortness of breath.   Cardiovascular:  Positive for chest pain. Negative for palpitations.  Gastrointestinal:  Negative for abdominal pain, diarrhea and vomiting.  Musculoskeletal:  Negative for arthralgias and myalgias.  Skin:  Negative for color change and rash.  Neurological:  Negative for tremors, syncope and headaches.  Psychiatric/Behavioral:  Negative for confusion and dysphoric mood.    Physical Exam Updated Vital Signs BP 110/81   Pulse (!) 117   Temp 98.3 F (36.8 C) (Oral)   Resp (!) 40   Ht 6\' 2"  (1.88 m)   Wt 99.8 kg   SpO2 97%   BMI 28.25 kg/m   Physical Exam Vitals and nursing note reviewed.  Constitutional:      Appearance: He is well-developed. He  is diaphoretic.  HENT:     Head: Normocephalic.     Comments: Abrasion left forehead Eyes:     Pupils: Pupils are equal, round, and reactive to light.  Neck:     Vascular: No JVD.  Cardiovascular:     Rate and Rhythm: Regular rhythm. Tachycardia present.     Heart sounds: No murmur heard.   No friction rub. No gallop.  Pulmonary:     Effort: Respiratory distress present.     Breath sounds: No wheezing.     Comments: Pain and crepitus along the right chest wall.  Tachypnea.  Absent lung sounds on the right. Chest:     Chest wall: Tenderness present.  Abdominal:     General: There is no distension.     Tenderness: There is no abdominal tenderness. There is no guarding or rebound.  Musculoskeletal:        General: Normal range of motion.     Cervical back: Normal range of motion and neck supple.     Comments: Abrasion left elbow.  Midline spine palpated without obvious tenderness stepoffs or deformities.    Significant pain to R posterior rib angles.  Pelvis stable, no pain with palpation of the extremities.  Internal and external rotation of the lower extremities without pain.  PMS intact to lower extremities.    2+ radial pulses.   Skin:    Coloration: Skin is not pale.      Findings: No rash.  Neurological:     Mental Status: He is alert and oriented to person, place, and time.  Psychiatric:        Behavior: Behavior normal.    ED Results / Procedures / Treatments   Labs (all labs ordered are listed, but only abnormal results are displayed) Labs Reviewed  COMPREHENSIVE METABOLIC PANEL - Abnormal; Notable for the following components:      Result Value   CO2 20 (*)    Glucose, Bld 176 (*)    Creatinine, Ser 1.38 (*)    Calcium 8.8 (*)    AST 114 (*)    ALT 89 (*)    GFR, Estimated 57 (*)    All other components within normal limits  CBC - Abnormal; Notable for the following components:   WBC 15.6 (*)    MCV 104.0 (*)    MCH 34.4 (*)    nRBC 0.3 (*)    All other components within normal limits  LACTIC ACID, PLASMA - Abnormal; Notable for the following components:   Lactic Acid, Venous 4.7 (*)    All other components within normal limits  I-STAT CHEM 8, ED - Abnormal; Notable for the following components:   BUN 26 (*)    Creatinine, Ser 1.30 (*)    Glucose, Bld 172 (*)    Calcium, Ion 1.11 (*)    All other components within normal limits  RESP PANEL BY RT-PCR (FLU A&B, COVID) [id number]  SURGICAL PCR SCREEN  ETHANOL  PROTIME-INR  URINALYSIS, ROUTINE W REFLEX MICROSCOPIC  CBC  RAPID URINE DRUG SCREEN, HOSP PERFORMED  COMPREHENSIVE METABOLIC PANEL  CBC WITH DIFFERENTIAL/PLATELET  VITAMIN D 25 HYDROXY (VIT D DEFICIENCY, FRACTURES)  HCV RNA QUANT RFLX ULTRA OR GENOTYP  HEPATITIS PANEL, ACUTE  HIV ANTIBODY (ROUTINE TESTING W REFLEX)  SAMPLE TO BLOOD BANK  TYPE AND SCREEN    EKG None  Radiology CT Head Wo Contrast  Result Date: 07/27/2021 CLINICAL DATA:  Thrown from a ladder, uncertain loss of consciousness, chest trauma EXAM:  CT HEAD WITHOUT CONTRAST TECHNIQUE: Contiguous axial images were obtained from the base of the skull through the vertex without intravenous contrast. COMPARISON:  08/03/2008 FINDINGS: Brain: Mild atrophy. Normal  ventricular morphology. No midline shift or mass effect. Mild small vessel chronic ischemic changes of deep cerebral white matter. No intracranial hemorrhage, mass lesion, or evidence of acute infarction. No extra-axial fluid collections. Vascular: No hyperdense vessels. Skull: Intact. Small chronic lucency lateral RIGHT frontal bone unchanged since 2009. Sinuses/Orbits: Small air-fluid level LEFT maxillary sinus. Prior surgery at the anterior walls of the maxillary sinuses bilaterally. Other: N/A IMPRESSION: Atrophy with small vessel chronic ischemic changes of deep cerebral white matter. No acute intracranial abnormalities. Small air-fluid level LEFT maxillary sinus without observed fracture. Electronically Signed   By: Lavonia Dana M.D.   On: 07/27/2021 16:45   CT Cervical Spine Wo Contrast  Result Date: 07/27/2021 CLINICAL DATA:  Thrown off a ladder, neck trauma, dangerous injury mechanism, uncertain loss of consciousness, pain all over EXAM: CT CERVICAL SPINE WITHOUT CONTRAST TECHNIQUE: Multidetector CT imaging of the cervical spine was performed without intravenous contrast. Multiplanar CT image reconstructions were also generated. COMPARISON:  08/03/2008 FINDINGS: Alignment: Normal Skull base and vertebrae: Osseous demineralization. Scattered motion artifacts. Superior endplate compression deformities of T3 and T4, unchanged since 10/11/2018. No acute fracture, subluxation or bone destruction. Mild scattered facet degenerative changes. Soft tissues and spinal canal: Prevertebral soft tissues normal thickness. Atherosclerotic calcifications at the carotid bifurcations bilaterally. Disc levels:  No specific abnormalities Upper chest: Small RIGHT apex pneumothorax. Other: N/A IMPRESSION: Chronic superior endplate compression deformities of T3 and T4. No acute cervical spine abnormalities. Small RIGHT apex pneumothorax. Critical Value/emergent results were called by telephone at the time of interpretation on  07/27/2021 at 4:33 pm to provider Lori-Ann Lindfors , who verbally acknowledged these results. Electronically Signed   By: Lavonia Dana M.D.   On: 07/27/2021 16:33   CT CHEST ABDOMEN PELVIS W CONTRAST  Result Date: 07/27/2021 CLINICAL DATA:  Fall off ladder. EXAM: CT CHEST, ABDOMEN, AND PELVIS WITH CONTRAST TECHNIQUE: Multidetector CT imaging of the chest, abdomen and pelvis was performed following the standard protocol during bolus administration of intravenous contrast. CONTRAST:  134mL OMNIPAQUE IOHEXOL 300 MG/ML  SOLN COMPARISON:  Feb 03, 2021. FINDINGS: CT CHEST FINDINGS Cardiovascular: No significant vascular findings. Normal heart size. No pericardial effusion. Mediastinum/Nodes: No enlarged mediastinal, hilar, or axillary lymph nodes. Thyroid gland, trachea, and esophagus demonstrate no significant findings. Lungs/Pleura: Left lung is clear. Small right apical pneumothorax is noted. Right-sided pigtail chest tube is noted laterally in the right hemithorax. Small right pleural effusion is noted. Mild right posterior basilar subsegmental atelectasis is noted. Musculoskeletal: Displaced fractures are seen involving the posterior portions of the right fifth, 6, seventh, eighth, ninth, tenth and eleventh ribs. Extensive subcutaneous emphysema is seen posteriorly in the right chest. Old T3 and T4 compression fractures are noted which were present on prior exam. CT ABDOMEN PELVIS FINDINGS Hepatobiliary: No gallstones or biliary dilatation is noted. Hepatic steatosis is noted. Pancreas: Unremarkable. No pancreatic ductal dilatation or surrounding inflammatory changes. Spleen: Normal in size without focal abnormality. Adrenals/Urinary Tract: Adrenal glands appear normal. Right renal cyst is noted. No hydronephrosis or renal obstruction is noted. Urinary bladder is unremarkable. Stomach/Bowel: Stomach is within normal limits. Appendix appears normal. No evidence of bowel wall thickening, distention, or inflammatory changes.  Vascular/Lymphatic: Aortic atherosclerosis. No enlarged abdominal or pelvic lymph nodes. Reproductive: Prostate is unremarkable. Other: Small pelvic hematoma is noted anterior to the  urinary bladder on the left side. No hernia is noted. Musculoskeletal: Mildly displaced fractures are seen involving bilateral inferior pubic rami. Nondisplaced left sacral fracture is noted. Nondisplaced right iliac wing fracture is noted. Widening of right sacroiliac joint is noted. Mildly displaced left superior pubic ramus fracture is noted. IMPRESSION: Small right apical pneumothorax is noted with right-sided pigtail chest tube already in place. Small right pleural effusion is noted with adjacent subsegmental atelectasis of the right lower lobe. Multiple displaced posterior right rib fractures are noted as described above. Nondisplaced right iliac wing fracture is noted with associated widening of the right sacroiliac joint. Displaced fractures are seen involving both inferior pubic rami as well as the left superior pubic ramus. Probable nondisplaced left sacral fracture is noted. Critical Value/emergent results were called by telephone at the time of interpretation on 07/27/2021 at 4:45 pm to provider Halil Rentz , who verbally acknowledged these results. Small pelvic hematoma is noted anterior to the urinary bladder. Hepatic steatosis. Electronically Signed   By: Lupita RaiderJames  Green Jr M.D.   On: 07/27/2021 16:46   DG Chest Port 1 View  Result Date: 07/27/2021 CLINICAL DATA:  falling off of eight foot ladder. EXAM: PORTABLE CHEST 1 VIEW. Incomplete view of the chest with collimation the lower hemi thoraces - per ED physician, patient was not stable and needed the chest tube right away. COMPARISON:  Chest x-ray 09/19/2019. CT chest 02/03/2021 FINDINGS: The heart and mediastinal contours are only partially visualized due to collimation off view. Aortic calcification Collimation of the lower bilateral hemi thoraces. No focal  consolidation. No pulmonary edema. No large pleural effusion identified. At least moderate volume right pneumothorax. No left pneumothorax. No acute osseous abnormality. Partially visualized reverse total shoulder arthroplasty on the left. IMPRESSION: 1. At least moderate volume right pneumothorax. 2. Incomplete chest x-ray. Collimation of the lower bilateral hemi-thoraces. These results were called by telephone at the time of interpretation on 07/27/2021 at 3:49 pm to provider Jordon Kristiansen , who verbally acknowledged these results. Electronically Signed   By: Tish FredericksonMorgane  Naveau M.D.   On: 07/27/2021 15:53    Procedures CHEST TUBE INSERTION  Date/Time: 07/27/2021 4:54 PM Performed by: Melene PlanFloyd, Indie Nickerson, DO Authorized by: Melene PlanFloyd, Imajean Mcdermid, DO   Consent:    Consent obtained:  Verbal   Consent given by:  Patient   Risks, benefits, and alternatives were discussed: yes     Risks discussed:  Bleeding, incomplete drainage and infection   Alternatives discussed:  No treatment, delayed treatment and alternative treatment Universal protocol:    Procedure explained and questions answered to patient or proxy's satisfaction: yes     Imaging studies available: yes     Immediately prior to procedure, a time out was called: yes     Patient identity confirmed:  Verbally with patient Pre-procedure details:    Skin preparation:  Chlorhexidine   Preparation: Patient was prepped and draped in the usual sterile fashion   Sedation:    Sedation type:  None Anesthesia:    Anesthesia method:  Local infiltration   Local anesthetic:  Lidocaine 2% WITH epi and lidocaine 1% w/o epi Procedure details:    Placement location:  R lateral   Tube size (Fr):  8   Ultrasound guidance: no     Tension pneumothorax: no     Tube connected to:  Suction   Drainage characteristics:  Bloody   Suture material:  0 silk   Dressing:  Petrolatum-impregnated gauze Post-procedure details:    Post-insertion x-ray findings:  tube in good position      Procedure completion:  Tolerated well, no immediate complications   Medications Ordered in ED Medications  lactated ringers infusion ( Intravenous New Bag/Given 07/27/21 1815)  ipratropium-albuterol (DUONEB) 0.5-2.5 (3) MG/3ML nebulizer solution 3 mL (has no administration in time range)  acetaminophen (TYLENOL) tablet 1,000 mg (1,000 mg Oral Given 07/27/21 1703)  melatonin tablet 3 mg (has no administration in time range)  docusate sodium (COLACE) capsule 100 mg (has no administration in time range)  ondansetron (ZOFRAN-ODT) disintegrating tablet 4 mg (has no administration in time range)    Or  ondansetron (ZOFRAN) injection 4 mg (has no administration in time range)  metoprolol tartrate (LOPRESSOR) injection 5 mg (has no administration in time range)  LORazepam (ATIVAN) tablet 1-4 mg (has no administration in time range)    Or  LORazepam (ATIVAN) injection 1-4 mg (has no administration in time range)  thiamine tablet 100 mg (100 mg Oral Given 07/27/21 1806)    Or  thiamine (B-1) injection 100 mg ( Intravenous See Alternative 07/27/21 1806)  folic acid (FOLVITE) tablet 1 mg (1 mg Oral Given 07/27/21 1806)  multivitamin with minerals tablet 1 tablet (1 tablet Oral Given 07/27/21 1806)  enoxaparin (LOVENOX) injection 40 mg (has no administration in time range)  methocarbamol (ROBAXIN) tablet 1,000 mg (1,000 mg Oral Given 07/27/21 1731)  lidocaine (LIDODERM) 5 % 1 patch (1 patch Transdermal Patch Applied 07/27/21 1815)  spiritus frumenti (ethyl alcohol) solution 1 each (has no administration in time range)  ipratropium-albuterol (DUONEB) 0.5-2.5 (3) MG/3ML nebulizer solution 3 mL (3 mLs Nebulization Given 07/27/21 1934)  chlorhexidine (HIBICLENS) 4 % liquid 4 application (has no administration in time range)  povidone-iodine 10 % swab 2 application (has no administration in time range)  ceFAZolin (ANCEF) IVPB 2g/100 mL premix (has no administration in time range)  Chlorhexidine Gluconate Cloth 2 %  PADS 6 each (6 each Topical Given 07/27/21 1814)  MEDLINE mouth rinse (has no administration in time range)  mupirocin ointment (BACTROBAN) 2 % 1 application (has no administration in time range)  HYDROmorphone (DILAUDID) injection 1-2 mg (2 mg Intravenous Given 07/27/21 2000)  oxyCODONE (Oxy IR/ROXICODONE) immediate release tablet 5-10 mg (10 mg Oral Given 07/27/21 2010)  gabapentin (NEURONTIN) capsule 300 mg (has no administration in time range)  fentaNYL (SUBLIMAZE) 100 MCG/2ML injection (100 mcg  Given 07/27/21 1538)  ketamine 50 mg in normal saline 5 mL (10 mg/mL) syringe (20 mg Intravenous Given 07/27/21 1543)  iohexol (OMNIPAQUE) 300 MG/ML solution 100 mL (100 mLs Intravenous Contrast Given 07/27/21 1625)  morphine 4 MG/ML injection 4 mg (4 mg Intravenous Given 07/27/21 1703)  lactated ringers bolus 1,000 mL (0 mLs Intravenous Stopped 07/27/21 1815)    ED Course  I have reviewed the triage vital signs and the nursing notes.  Pertinent labs & imaging results that were available during my care of the patient were reviewed by me and considered in my medical decision making (see chart for details).    MDM Rules/Calculators/A&P                           65 yo M with a chief complaints of right-sided chest wall pain difficulty breathing after falling off of a ladder.  Patient with absent lung sounds on the right side on my initial exam.  Arrived in a seated position.  Palpation of the midline spine without obvious tenderness, though distracting injury with chest discomfort. Chest  x-ray concerning for right-sided pneumothorax.  Chest tube placed.  Patient's respiratory effort and oxygenation is improved.  We will send to CT head C-spine chest abdomen pelvis.  Patient with a stable pelvis on initial exam deferred plain film of the pelvis as he was getting a CT scan imminently.  CT scan does show multiple pelvic fractures.  Multiple rib fractures on the right.  I discussed with trauma  surgery.  CRITICAL CARE Performed by: Cecilio Asper   Total critical care time: 35 minutes  Critical care time was exclusive of separately billable procedures and treating other patients.  Critical care was necessary to treat or prevent imminent or life-threatening deterioration.  Critical care was time spent personally by me on the following activities: development of treatment plan with patient and/or surrogate as well as nursing, discussions with consultants, evaluation of patient's response to treatment, examination of patient, obtaining history from patient or surrogate, ordering and performing treatments and interventions, ordering and review of laboratory studies, ordering and review of radiographic studies, pulse oximetry and re-evaluation of patient's condition.  The patients results and plan were reviewed and discussed.   Any x-rays performed were independently reviewed by myself.   Differential diagnosis were considered with the presenting HPI.  Medications  lactated ringers infusion ( Intravenous New Bag/Given 07/27/21 1815)  ipratropium-albuterol (DUONEB) 0.5-2.5 (3) MG/3ML nebulizer solution 3 mL (has no administration in time range)  acetaminophen (TYLENOL) tablet 1,000 mg (1,000 mg Oral Given 07/27/21 1703)  melatonin tablet 3 mg (has no administration in time range)  docusate sodium (COLACE) capsule 100 mg (has no administration in time range)  ondansetron (ZOFRAN-ODT) disintegrating tablet 4 mg (has no administration in time range)    Or  ondansetron (ZOFRAN) injection 4 mg (has no administration in time range)  metoprolol tartrate (LOPRESSOR) injection 5 mg (has no administration in time range)  LORazepam (ATIVAN) tablet 1-4 mg (has no administration in time range)    Or  LORazepam (ATIVAN) injection 1-4 mg (has no administration in time range)  thiamine tablet 100 mg (100 mg Oral Given 07/27/21 1806)    Or  thiamine (B-1) injection 100 mg ( Intravenous See  Alternative [id number])  folic acid (FOLVITE) tablet 1 mg (1 mg Oral Given 07/27/21 1806)  multivitamin with minerals tablet 1 tablet (1 tablet Oral Given 07/27/21 1806)  enoxaparin (LOVENOX) injection 40 mg (has no administration in time range)  methocarbamol (ROBAXIN) tablet 1,000 mg (1,000 mg Oral Given 07/27/21 1731)  lidocaine (LIDODERM) 5 % 1 patch (1 patch Transdermal Patch Applied 07/27/21 1815)  spiritus frumenti (ethyl alcohol) solution 1 each (has no administration in time range)  ipratropium-albuterol (DUONEB) 0.5-2.5 (3) MG/3ML nebulizer solution 3 mL (3 mLs Nebulization Given 07/27/21 1934)  chlorhexidine (HIBICLENS) 4 % liquid 4 application (has no administration in time range)  povidone-iodine 10 % swab 2 application (has no administration in time range)  ceFAZolin (ANCEF) IVPB 2g/100 mL premix (has no administration in time range)  Chlorhexidine Gluconate Cloth 2 % PADS 6 each (6 each Topical Given 07/27/21 1814)  MEDLINE mouth rinse (has no administration in time range)  mupirocin ointment (BACTROBAN) 2 % 1 application (has no administration in time range)  HYDROmorphone (DILAUDID) injection 1-2 mg (2 mg Intravenous Given 07/27/21 2000)  oxyCODONE (Oxy IR/ROXICODONE) immediate release tablet 5-10 mg (10 mg Oral Given 07/27/21 2010)  gabapentin (NEURONTIN) capsule 300 mg (has no administration in time range)  fentaNYL (SUBLIMAZE) 100 MCG/2ML injection (100 mcg  Given 07/27/21 1538)  ketamine 50 mg in normal saline 5 mL (10 mg/mL) syringe (20 mg Intravenous Given 07/27/21 1543)  iohexol (OMNIPAQUE) 300 MG/ML solution 100 mL (100 mLs Intravenous Contrast Given 07/27/21 1625)  morphine 4 MG/ML injection 4 mg (4 mg Intravenous Given 07/27/21 1703)  lactated ringers bolus 1,000 mL (0 mLs Intravenous Stopped 07/27/21 1815)    Vitals:   07/27/21 1800 07/27/21 1900 07/27/21 1934 07/27/21 2000  BP: 125/84 110/81    Pulse: (!) 126 (!) 117    Resp: (!) 39 (!) 40    Temp:    98.3 F (36.8 C)   TempSrc:    Oral  SpO2: 95% 97% 97%   Weight:      Height:        Final diagnoses:  Trauma  Closed fracture of multiple ribs of right side, initial encounter  Hemopneumothorax on right  Multiple closed fractures of pelvis with stable disruption of pelvic ring, initial encounter ([place])    Admission/ observation were discussed with the admitting physician, patient and/or family and they are comfortable with the plan.    Final Clinical Impression(s) / ED Diagnoses Final diagnoses:  Trauma  Closed fracture of multiple ribs of right side, initial encounter  Hemopneumothorax on right  Multiple closed fractures of pelvis with stable disruption of pelvic ring, initial encounter [facility])    Rx / [place] Orders ED Discharge Orders     None          Deno Etienne, DO 07/27/21 2018
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
## 2021-07-28 ENCOUNTER — Inpatient Hospital Stay (HOSPITAL_COMMUNITY): Payer: BC Managed Care – PPO

## 2021-07-28 ENCOUNTER — Encounter (HOSPITAL_COMMUNITY)
Admission: EM | Disposition: A | Discharge status: Nursing Home (Includes ICF) | Payer: Self-pay | Source: Home / Self Care

## 2021-07-28 LAB — CBC WITH DIFFERENTIAL/PLATELET
Abs Immature Granulocytes: 0.09 10*3/uL — ABNORMAL HIGH (ref 0.00–0.07)
Basophils Absolute: 0 10*3/uL (ref 0.0–0.1)
Basophils Relative: 0 %
Eosinophils Absolute: 0 10*3/uL (ref 0.0–0.5)
Eosinophils Relative: 0 %
HCT: 37.6 % — ABNORMAL LOW (ref 39.0–52.0)
Hemoglobin: 12.8 g/dL — ABNORMAL LOW (ref 13.0–17.0)
Immature Granulocytes: 1 %
Lymphocytes Relative: 3 %
Lymphs Abs: 0.4 10*3/uL — ABNORMAL LOW (ref 0.7–4.0)
MCH: 35.1 pg — ABNORMAL HIGH (ref 26.0–34.0)
MCHC: 34 g/dL (ref 30.0–36.0)
MCV: 103 fL — ABNORMAL HIGH (ref 80.0–100.0)
Monocytes Absolute: 1.3 10*3/uL — ABNORMAL HIGH (ref 0.1–1.0)
Monocytes Relative: 10 %
Neutro Abs: 10.9 10*3/uL — ABNORMAL HIGH (ref 1.7–7.7)
Neutrophils Relative %: 86 %
Platelets: 149 10*3/uL — ABNORMAL LOW (ref 150–400)
RBC: 3.65 MIL/uL — ABNORMAL LOW (ref 4.22–5.81)
RDW: 12.3 % (ref 11.5–15.5)
WBC: 12.7 10*3/uL — ABNORMAL HIGH (ref 4.0–10.5)
nRBC: 0 % (ref 0.0–0.2)

## 2021-07-28 LAB — BASIC METABOLIC PANEL
Anion gap: 8 (ref 5–15)
Anion gap: 8 (ref 5–15)
Anion gap: 8 (ref 5–15)
BUN: 29 mg/dL — ABNORMAL HIGH (ref 8–23)
BUN: 31 mg/dL — ABNORMAL HIGH (ref 8–23)
BUN: 32 mg/dL — ABNORMAL HIGH (ref 8–23)
CO2: 23 mmol/L (ref 22–32)
CO2: 23 mmol/L (ref 22–32)
CO2: 20 mmol/L — ABNORMAL LOW (ref 22–32)
Calcium: 8.1 mg/dL — ABNORMAL LOW (ref 8.9–10.3)
Calcium: 8 mg/dL — ABNORMAL LOW (ref 8.9–10.3)
Calcium: 7.3 mg/dL — ABNORMAL LOW (ref 8.9–10.3)
Chloride: 103 mmol/L (ref 98–111)
Chloride: 103 mmol/L (ref 98–111)
Chloride: 105 mmol/L (ref 98–111)
Creatinine, Ser: 1.53 mg/dL — ABNORMAL HIGH (ref 0.61–1.24)
Creatinine, Ser: 1.81 mg/dL — ABNORMAL HIGH (ref 0.61–1.24)
Creatinine, Ser: 1.66 mg/dL — ABNORMAL HIGH (ref 0.61–1.24)
GFR, Estimated: 50 mL/min — ABNORMAL LOW (ref >60)
GFR, Estimated: 41 mL/min — ABNORMAL LOW (ref >60)
GFR, Estimated: 46 mL/min — ABNORMAL LOW (ref >60)
Glucose, Bld: 155 mg/dL — ABNORMAL HIGH (ref 70–99)
Glucose, Bld: 112 mg/dL — ABNORMAL HIGH (ref 70–99)
Glucose, Bld: 118 mg/dL — ABNORMAL HIGH (ref 70–99)
Potassium: 6.2 mmol/L — ABNORMAL HIGH (ref 3.5–5.1)
Potassium: 5.6 mmol/L — ABNORMAL HIGH (ref 3.5–5.1)
Potassium: 5.5 mmol/L — ABNORMAL HIGH (ref 3.5–5.1)
Sodium: 134 mmol/L — ABNORMAL LOW (ref 135–145)
Sodium: 134 mmol/L — ABNORMAL LOW (ref 135–145)
Sodium: 133 mmol/L — ABNORMAL LOW (ref 135–145)

## 2021-07-28 LAB — POCT I-STAT 7, (LYTES, BLD GAS, ICA,H+H)
Acid-base deficit: 6 mmol/L — ABNORMAL HIGH (ref 0.0–2.0)
Bicarbonate: 22.9 mmol/L (ref 20.0–28.0)
Calcium, Ion: 1.14 mmol/L — ABNORMAL LOW (ref 1.15–1.40)
HCT: 30 % — ABNORMAL LOW (ref 39.0–52.0)
Hemoglobin: 10.2 g/dL — ABNORMAL LOW (ref 13.0–17.0)
O2 Saturation: 100 %
Patient temperature: 98.2
Potassium: 4.8 mmol/L (ref 3.5–5.1)
Sodium: 136 mmol/L (ref 135–145)
TCO2: 25 mmol/L (ref 22–32)
pCO2 arterial: 63.3 mmHg — ABNORMAL HIGH (ref 32.0–48.0)
pH, Arterial: 7.166 — CL (ref 7.350–7.450)
pO2, Arterial: 217 mmHg — ABNORMAL HIGH (ref 83.0–108.0)

## 2021-07-28 LAB — URINALYSIS, ROUTINE W REFLEX MICROSCOPIC
Bilirubin Urine: NEGATIVE
Glucose, UA: NEGATIVE mg/dL
Ketones, ur: NEGATIVE mg/dL
Leukocytes,Ua: NEGATIVE
Nitrite: NEGATIVE
Protein, ur: 30 mg/dL — AB
Specific Gravity, Urine: 1.044 — ABNORMAL HIGH (ref 1.005–1.030)
pH: 5 (ref 5.0–8.0)

## 2021-07-28 LAB — GLUCOSE, CAPILLARY
Glucose-Capillary: 214 mg/dL — ABNORMAL HIGH (ref 70–99)
Glucose-Capillary: 110 mg/dL — ABNORMAL HIGH (ref 70–99)
Glucose-Capillary: 120 mg/dL — ABNORMAL HIGH (ref 70–99)
Glucose-Capillary: 116 mg/dL — ABNORMAL HIGH (ref 70–99)
Glucose-Capillary: 151 mg/dL — ABNORMAL HIGH (ref 70–99)

## 2021-07-28 LAB — RAPID URINE DRUG SCREEN, HOSP PERFORMED
Amphetamines: NOT DETECTED
Barbiturates: NOT DETECTED
Benzodiazepines: NOT DETECTED
Cocaine: NOT DETECTED
Opiates: POSITIVE — AB
Tetrahydrocannabinol: POSITIVE — AB

## 2021-07-28 LAB — HIV ANTIBODY (ROUTINE TESTING W REFLEX): HIV Screen 4th Generation wRfx: NONREACTIVE

## 2021-07-28 LAB — COMPREHENSIVE METABOLIC PANEL
ALT: 76 U/L — ABNORMAL HIGH (ref 0–44)
AST: 86 U/L — ABNORMAL HIGH (ref 15–41)
Albumin: 3.9 g/dL (ref 3.5–5.0)
Alkaline Phosphatase: 51 U/L (ref 38–126)
Anion gap: 9 (ref 5–15)
BUN: 28 mg/dL — ABNORMAL HIGH (ref 8–23)
CO2: 20 mmol/L — ABNORMAL LOW (ref 22–32)
Calcium: 8.2 mg/dL — ABNORMAL LOW (ref 8.9–10.3)
Chloride: 104 mmol/L (ref 98–111)
Creatinine, Ser: 1.48 mg/dL — ABNORMAL HIGH (ref 0.61–1.24)
GFR, Estimated: 53 mL/min — ABNORMAL LOW (ref >60)
Glucose, Bld: 162 mg/dL — ABNORMAL HIGH (ref 70–99)
Potassium: 6.6 mmol/L (ref 3.5–5.1)
Sodium: 133 mmol/L — ABNORMAL LOW (ref 135–145)
Total Bilirubin: 0.9 mg/dL (ref 0.3–1.2)
Total Protein: 7 g/dL (ref 6.5–8.1)

## 2021-07-28 LAB — POCT I-STAT, CHEM 8
BUN: 32 mg/dL — ABNORMAL HIGH (ref 8–23)
Calcium, Ion: 1.17 mmol/L (ref 1.15–1.40)
Chloride: 104 mmol/L (ref 98–111)
Creatinine, Ser: 1.5 mg/dL — ABNORMAL HIGH (ref 0.61–1.24)
Glucose, Bld: 148 mg/dL — ABNORMAL HIGH (ref 70–99)
HCT: 36 % — ABNORMAL LOW (ref 39.0–52.0)
Hemoglobin: 12.2 g/dL — ABNORMAL LOW (ref 13.0–17.0)
Potassium: 6.6 mmol/L (ref 3.5–5.1)
Sodium: 136 mmol/L (ref 135–145)
TCO2: 25 mmol/L (ref 22–32)

## 2021-07-28 LAB — HEPATITIS PANEL, ACUTE
HCV Ab: REACTIVE — AB
Hep A IgM: NONREACTIVE
Hep B C IgM: NONREACTIVE
Hepatitis B Surface Ag: NONREACTIVE

## 2021-07-28 LAB — HEMOGLOBIN A1C
Hgb A1c MFr Bld: 5.4 % (ref 4.8–5.6)
Mean Plasma Glucose: 108.28 mg/dL

## 2021-07-28 LAB — VITAMIN D 25 HYDROXY (VIT D DEFICIENCY, FRACTURES): Vit D, 25-Hydroxy: 21.5 ng/mL — ABNORMAL LOW (ref 30–100)

## 2021-07-28 SURGERY — CLOSED REDUCTION, PELVIS, WITH PERCUTANEOUS FIXATION
Anesthesia: General | Laterality: Right

## 2021-07-28 MED ORDER — LORAZEPAM 2 MG/ML IJ SOLN
1.0000 mg | INTRAMUSCULAR | Status: AC | PRN
Start: 2021-07-28 — End: 2021-07-30
  Administered 2021-07-28 – 2021-07-29 (×2): 1 mg via INTRAVENOUS
  Administered 2021-07-29: 2 mg via INTRAVENOUS
  Administered 2021-07-29: 1 mg via INTRAVENOUS
  Administered 2021-07-30: 2 mg via INTRAVENOUS
  Administered 2021-07-30: 4 mg via INTRAVENOUS
  Administered 2021-07-30 (×2): 3 mg via INTRAVENOUS
  Filled 2021-07-28 (×6): qty 1
  Filled 2021-07-28 (×2): qty 2
  Filled 2021-07-28: qty 1

## 2021-07-28 MED ORDER — ROCURONIUM BROMIDE 10 MG/ML (PF) SYRINGE
PREFILLED_SYRINGE | INTRAVENOUS | Status: AC
  Administered 2021-07-28: 100 mg
  Filled 2021-07-28: qty 10

## 2021-07-28 MED ORDER — ADULT MULTIVITAMIN W/MINERALS CH
1.0000 | ORAL_TABLET | Freq: Every day | ORAL | Status: DC
  Administered 2021-07-29 – 2021-08-30 (×32): 1
  Filled 2021-07-28 (×32): qty 1

## 2021-07-28 MED ORDER — DEXTROSE 50 % IV SOLN
1.0000 | Freq: Once | INTRAVENOUS | Status: AC
  Administered 2021-07-28: 50 mL via INTRAVENOUS
  Filled 2021-07-28: qty 50

## 2021-07-28 MED ORDER — VASOPRESSIN 20 UNITS/100 ML INFUSION FOR SHOCK
0.0000 [IU]/min | INTRAVENOUS | Status: DC
  Administered 2021-07-29 (×3): 0.04 [IU]/min via INTRAVENOUS
  Filled 2021-07-28 (×2): qty 100

## 2021-07-28 MED ORDER — INSULIN ASPART 100 UNIT/ML IJ SOLN
0.0000 [IU] | INTRAMUSCULAR | Status: DC
Start: 2021-07-28 — End: 2021-07-28

## 2021-07-28 MED ORDER — SODIUM CHLORIDE 0.9 % IV SOLN
INTRAVENOUS | Status: DC
  Administered 2021-07-28 (×2): 100 mL/h via INTRAVENOUS

## 2021-07-28 MED ORDER — KETAMINE HCL 50 MG/5ML IJ SOSY
PREFILLED_SYRINGE | INTRAMUSCULAR | Status: AC
  Administered 2021-07-28: 50 mg via INTRAVENOUS
  Filled 2021-07-28: qty 10

## 2021-07-28 MED ORDER — DOCUSATE SODIUM 50 MG/5ML PO LIQD
100.0000 mg | Freq: Two times a day (BID) | ORAL | Status: DC
  Administered 2021-07-28 – 2021-08-07 (×18): 100 mg
  Filled 2021-07-28 (×21): qty 10

## 2021-07-28 MED ORDER — NOREPINEPHRINE 4 MG/250ML-% IV SOLN: 0.0000 ug/min | INTRAVENOUS | Status: DC

## 2021-07-28 MED ORDER — ORAL CARE MOUTH RINSE
15.0000 mL | OROMUCOSAL | Status: DC
  Administered 2021-07-28 – 2021-08-30 (×320): 15 mL via OROMUCOSAL

## 2021-07-28 MED ORDER — ONDANSETRON HCL 4 MG/2ML IJ SOLN
INTRAMUSCULAR | Status: AC
  Filled 2021-07-28: qty 2

## 2021-07-28 MED ORDER — PHENYLEPHRINE 40 MCG/ML (10ML) SYRINGE FOR IV PUSH (FOR BLOOD PRESSURE SUPPORT)
PREFILLED_SYRINGE | INTRAVENOUS | Status: AC
  Filled 2021-07-28: qty 10

## 2021-07-28 MED ORDER — FENTANYL CITRATE (PF) 250 MCG/5ML IJ SOLN
INTRAMUSCULAR | Status: AC
  Filled 2021-07-28: qty 5

## 2021-07-28 MED ORDER — MIDAZOLAM HCL 2 MG/2ML IJ SOLN
1.0000 mg | INTRAMUSCULAR | Status: DC | PRN
  Administered 2021-07-30 – 2021-08-02 (×5): 2 mg via INTRAVENOUS
  Administered 2021-08-09 – 2021-08-10 (×3): 1 mg via INTRAVENOUS
  Administered 2021-08-11: 2 mg via INTRAVENOUS
  Filled 2021-07-28 (×9): qty 2

## 2021-07-28 MED ORDER — FENTANYL BOLUS VIA INFUSION
50.0000 ug | INTRAVENOUS | Status: DC | PRN
  Administered 2021-07-30 (×2): 100 ug via INTRAVENOUS
  Administered 2021-07-31: 50 ug via INTRAVENOUS
  Administered 2021-07-31 – 2021-08-01 (×3): 100 ug via INTRAVENOUS
  Administered 2021-08-01: 50 ug via INTRAVENOUS
  Administered 2021-08-02 – 2021-08-06 (×4): 100 ug via INTRAVENOUS
  Administered 2021-08-06: 50 ug via INTRAVENOUS
  Administered 2021-08-07: 100 ug via INTRAVENOUS
  Administered 2021-08-07 (×2): 50 ug via INTRAVENOUS
  Administered 2021-08-07: 75 ug via INTRAVENOUS
  Administered 2021-08-07 (×3): 100 ug via INTRAVENOUS
  Administered 2021-08-07: 50 ug via INTRAVENOUS
  Administered 2021-08-08 (×2): 75 ug via INTRAVENOUS
  Administered 2021-08-08: 50 ug via INTRAVENOUS
  Administered 2021-08-08: 75 ug via INTRAVENOUS
  Administered 2021-08-08: 100 ug via INTRAVENOUS
  Administered 2021-08-08 (×3): 50 ug via INTRAVENOUS
  Administered 2021-08-09: 100 ug via INTRAVENOUS
  Administered 2021-08-09: 75 ug via INTRAVENOUS
  Filled 2021-07-28: qty 100

## 2021-07-28 MED ORDER — NOREPINEPHRINE 4 MG/250ML-% IV SOLN
0.0000 ug/min | INTRAVENOUS | Status: DC
  Administered 2021-07-28 – 2021-07-29 (×2): 5 ug/min via INTRAVENOUS
  Administered 2021-07-29: 1 ug/min via INTRAVENOUS
  Filled 2021-07-28 (×3): qty 250

## 2021-07-28 MED ORDER — THIAMINE HCL 100 MG/ML IJ SOLN
100.0000 mg | Freq: Every day | INTRAMUSCULAR | Status: DC
  Administered 2021-07-31 – 2021-08-10 (×3): 100 mg via INTRAVENOUS
  Filled 2021-07-28 (×3): qty 2

## 2021-07-28 MED ORDER — METHOCARBAMOL 500 MG PO TABS
1000.0000 mg | ORAL_TABLET | Freq: Three times a day (TID) | ORAL | Status: DC
  Administered 2021-07-28 – 2021-08-30 (×98): 1000 mg
  Filled 2021-07-28 (×98): qty 2

## 2021-07-28 MED ORDER — ROCURONIUM BROMIDE 50 MG/5ML IV SOLN
100.0000 mg | Freq: Once | INTRAVENOUS | Status: AC
  Administered 2021-07-28: 100 mg via INTRAVENOUS
  Filled 2021-07-28: qty 10

## 2021-07-28 MED ORDER — INSULIN ASPART 100 UNIT/ML IJ SOLN
INTRAMUSCULAR | Status: AC
  Administered 2021-07-28: 10 [IU] via INTRAVENOUS
  Filled 2021-07-28: qty 1

## 2021-07-28 MED ORDER — MIDAZOLAM HCL 2 MG/2ML IJ SOLN
INTRAMUSCULAR | Status: AC
  Filled 2021-07-28: qty 2

## 2021-07-28 MED ORDER — THIAMINE HCL 100 MG PO TABS
100.0000 mg | ORAL_TABLET | Freq: Every day | ORAL | Status: DC
  Administered 2021-07-29 – 2021-08-30 (×30): 100 mg
  Filled 2021-07-28 (×30): qty 1

## 2021-07-28 MED ORDER — KETAMINE HCL 50 MG/5ML IJ SOSY
PREFILLED_SYRINGE | INTRAMUSCULAR | Status: AC
  Administered 2021-07-28: 50 mg via INTRAVENOUS
  Filled 2021-07-28: qty 5

## 2021-07-28 MED ORDER — POLYETHYLENE GLYCOL 3350 17 G PO PACK
17.0000 g | PACK | Freq: Every day | ORAL | Status: DC
  Administered 2021-07-29 – 2021-07-31 (×3): 17 g
  Filled 2021-07-28 (×3): qty 1

## 2021-07-28 MED ORDER — FENTANYL 2500MCG IN NS 250ML (10MCG/ML) PREMIX INFUSION
50.0000 ug/h | INTRAVENOUS | Status: DC
  Administered 2021-07-28: 50 ug/h via INTRAVENOUS
  Administered 2021-07-29: 100 ug/h via INTRAVENOUS
  Administered 2021-07-30: 80 ug/h via INTRAVENOUS
  Administered 2021-07-31: 100 ug/h via INTRAVENOUS
  Administered 2021-08-01 – 2021-08-02 (×2): 150 ug/h via INTRAVENOUS
  Administered 2021-08-02: 100 ug/h via INTRAVENOUS
  Administered 2021-08-03: 50 ug/h via INTRAVENOUS
  Administered 2021-08-05 – 2021-08-09 (×4): 100 ug/h via INTRAVENOUS
  Filled 2021-07-28 (×13): qty 250

## 2021-07-28 MED ORDER — GABAPENTIN 250 MG/5ML PO SOLN
300.0000 mg | Freq: Three times a day (TID) | ORAL | Status: DC
  Administered 2021-07-28 – 2021-08-11 (×41): 300 mg
  Filled 2021-07-28 (×44): qty 6

## 2021-07-28 MED ORDER — SODIUM CHLORIDE 0.9 % IV SOLN
250.0000 mL | INTRAVENOUS | Status: DC
  Administered 2021-08-09 – 2021-08-11 (×3): 250 mL via INTRAVENOUS

## 2021-07-28 MED ORDER — CHLORHEXIDINE GLUCONATE 0.12% ORAL RINSE (MEDLINE KIT)
15.0000 mL | Freq: Two times a day (BID) | OROMUCOSAL | Status: DC
  Administered 2021-07-28 – 2021-08-30 (×65): 15 mL via OROMUCOSAL

## 2021-07-28 MED ORDER — MELATONIN 3 MG PO TABS
3.0000 mg | ORAL_TABLET | Freq: Every evening | ORAL | Status: DC | PRN
  Administered 2021-07-30 – 2021-08-17 (×2): 3 mg
  Filled 2021-07-28 (×4): qty 1

## 2021-07-28 MED ORDER — INSULIN ASPART 100 UNIT/ML IV SOLN: 5.0000 [IU] | Freq: Once | INTRAVENOUS | Status: AC

## 2021-07-28 MED ORDER — FENTANYL CITRATE PF 50 MCG/ML IJ SOSY
50.0000 ug | PREFILLED_SYRINGE | Freq: Once | INTRAMUSCULAR | Status: AC
  Administered 2021-07-28: 50 ug via INTRAVENOUS

## 2021-07-28 MED ORDER — ACETAMINOPHEN 160 MG/5ML PO SOLN
1000.0000 mg | Freq: Four times a day (QID) | ORAL | Status: DC
  Administered 2021-07-28 – 2021-08-04 (×25): 1000 mg
  Filled 2021-07-28 (×27): qty 40.6

## 2021-07-28 MED ORDER — SODIUM CHLORIDE 0.9 % IV BOLUS
500.0000 mL | Freq: Once | INTRAVENOUS | Status: AC
  Administered 2021-07-28: 500 mL via INTRAVENOUS

## 2021-07-28 MED ORDER — FOLIC ACID 1 MG PO TABS
1.0000 mg | ORAL_TABLET | Freq: Every day | ORAL | Status: DC
  Administered 2021-07-29 – 2021-08-30 (×32): 1 mg
  Filled 2021-07-28 (×32): qty 1

## 2021-07-28 MED ORDER — OXYCODONE HCL 5 MG/5ML PO SOLN
5.0000 mg | ORAL | Status: DC | PRN
  Administered 2021-07-28 – 2021-07-30 (×2): 5 mg
  Filled 2021-07-28 (×2): qty 5

## 2021-07-28 MED ORDER — INSULIN ASPART 100 UNIT/ML IJ SOLN
INTRAMUSCULAR | Status: AC
  Filled 2021-07-28: qty 1

## 2021-07-28 MED ORDER — VECURONIUM BROMIDE 10 MG IV SOLR
INTRAVENOUS | Status: AC
  Filled 2021-07-28: qty 10

## 2021-07-28 MED ORDER — FENTANYL CITRATE PF 50 MCG/ML IJ SOSY
PREFILLED_SYRINGE | INTRAMUSCULAR | Status: AC
  Administered 2021-07-28: 50 ug
  Filled 2021-07-28: qty 1

## 2021-07-28 MED ORDER — DEXAMETHASONE SODIUM PHOSPHATE 10 MG/ML IJ SOLN
INTRAMUSCULAR | Status: AC
  Filled 2021-07-28: qty 1

## 2021-07-28 MED ORDER — KETAMINE HCL 50 MG/5ML IJ SOSY
1.5000 mg/kg | PREFILLED_SYRINGE | Freq: Once | INTRAMUSCULAR | Status: AC
  Administered 2021-07-28: 50 mg via INTRAVENOUS

## 2021-07-28 MED ORDER — LORAZEPAM 1 MG PO TABS: 1.0000 mg | ORAL_TABLET | ORAL | Status: AC | PRN

## 2021-07-28 MED ORDER — PROPOFOL 10 MG/ML IV BOLUS
INTRAVENOUS | Status: AC
  Filled 2021-07-28: qty 40

## 2021-07-28 MED ORDER — SODIUM ZIRCONIUM CYCLOSILICATE 10 G PO PACK
10.0000 g | PACK | Freq: Once | ORAL | Status: AC
  Administered 2021-07-28: 10 g via ORAL
  Filled 2021-07-28: qty 1

## 2021-07-28 MED ORDER — DEXMEDETOMIDINE HCL IN NACL 400 MCG/100ML IV SOLN
0.0000 ug/kg/h | INTRAVENOUS | Status: AC
  Administered 2021-07-28: 0.3 ug/kg/h via INTRAVENOUS
  Administered 2021-07-29 (×3): 0.8 ug/kg/h via INTRAVENOUS
  Administered 2021-07-29: 0.4 ug/kg/h via INTRAVENOUS
  Administered 2021-07-30 (×2): 0.9 ug/kg/h via INTRAVENOUS
  Administered 2021-07-30: 0.5 ug/kg/h via INTRAVENOUS
  Administered 2021-07-30 – 2021-07-31 (×2): 0.9 ug/kg/h via INTRAVENOUS
  Administered 2021-07-31: 0.5 ug/kg/h via INTRAVENOUS
  Administered 2021-07-31: 1.2 ug/kg/h via INTRAVENOUS
  Filled 2021-07-28 (×12): qty 100

## 2021-07-28 NOTE — Progress Notes (Signed)
Report given to Amy on 4N.

## 2021-07-28 NOTE — Progress Notes (Signed)
Trauma/Critical Care Follow Up Note  Subjective:    Overnight Issues:   Objective:  Vital signs for last 24 hours: Temp:  [96.2 F (35.7 C)-99 F (37.2 C)] 97.8 F (36.6 C) (11/09 0838) Pulse Rate:  [85-141] 89 (11/09 0838) Resp:  [10-45] 17 (11/09 0838) BP: (103-194)/(68-116) 117/68 (11/09 0838) SpO2:  [85 %-100 %] 96 % (11/09 0838) FiO2 (%):  [40 %] 40 % (11/09 0410) Weight:  [99.8 kg] 99.8 kg (11/08 1606)  Hemodynamic parameters for last 24 hours:    Intake/Output from previous day: 11/08 0701 - 11/09 0700 In: 2345.2 [I.V.:1246.3; IV Piggyback:1098.9] Out: 1130 [Urine:800; Chest Tube:330]  Intake/Output this shift: Total I/O In: 74.9 [I.V.:74.9] Out: 50 [Chest Tube:50]  Vent settings for last 24 hours: FiO2 (%):  [40 %] 40 %  Physical Exam:  Gen: moderately uncomfortable, no distress Neuro: non-focal exam HEENT: PERRL Neck: supple CV: RRR Pulm: unlabored breathing Abd: soft, NT GU: clear yellow urine Extr: wwp, no edema   Results for orders placed or performed during the hospital encounter of 07/27/21 (from the past 24 hour(s))  Resp Panel by RT-PCR (Flu A&B, Covid) Nasopharyngeal Swab     Status: None   Collection Time: 07/27/21  3:34 PM   Specimen: Nasopharyngeal Swab; Nasopharyngeal(NP) swabs in vial transport medium  Result Value Ref Range   SARS Coronavirus 2 by RT PCR NEGATIVE NEGATIVE   Influenza A by PCR NEGATIVE NEGATIVE   Influenza B by PCR NEGATIVE NEGATIVE  Comprehensive metabolic panel     Status: Abnormal   Collection Time: 07/27/21  3:34 PM  Result Value Ref Range   Sodium 139 135 - 145 mmol/L   Potassium 4.4 3.5 - 5.1 mmol/L   Chloride 106 98 - 111 mmol/L   CO2 20 (L) 22 - 32 mmol/L   Glucose, Bld 176 (H) 70 - 99 mg/dL   BUN 20 8 - 23 mg/dL   Creatinine, Ser 4.76 (H) 0.61 - 1.24 mg/dL   Calcium 8.8 (L) 8.9 - 10.3 mg/dL   Total Protein 8.1 6.5 - 8.1 g/dL   Albumin 4.5 3.5 - 5.0 g/dL   AST 546 (H) 15 - 41 U/L   ALT 89 (H) 0 - 44  U/L   Alkaline Phosphatase 78 38 - 126 U/L   Total Bilirubin 0.9 0.3 - 1.2 mg/dL   GFR, Estimated 57 (L) >60 mL/min   Anion gap 13 5 - 15  CBC     Status: Abnormal   Collection Time: 07/27/21  3:34 PM  Result Value Ref Range   WBC 15.6 (H) 4.0 - 10.5 K/uL   RBC 4.25 4.22 - 5.81 MIL/uL   Hemoglobin 14.6 13.0 - 17.0 g/dL   HCT 50.3 54.6 - 56.8 %   MCV 104.0 (H) 80.0 - 100.0 fL   MCH 34.4 (H) 26.0 - 34.0 pg   MCHC 33.0 30.0 - 36.0 g/dL   RDW 12.7 51.7 - 00.1 %   Platelets 223 150 - 400 K/uL   nRBC 0.3 (H) 0.0 - 0.2 %  Ethanol     Status: None   Collection Time: 07/27/21  3:34 PM  Result Value Ref Range   Alcohol, Ethyl (B) <10 <10 mg/dL  Lactic acid, plasma     Status: Abnormal   Collection Time: 07/27/21  3:34 PM  Result Value Ref Range   Lactic Acid, Venous 4.7 (HH) 0.5 - 1.9 mmol/L  Protime-INR     Status: None   Collection Time: 07/27/21  3:34 PM  Result Value Ref Range   Prothrombin Time 14.4 11.4 - 15.2 seconds   INR 1.1 0.8 - 1.2  Sample to Blood Bank     Status: None   Collection Time: 07/27/21  3:38 PM  Result Value Ref Range   Blood Bank Specimen SAMPLE AVAILABLE FOR TESTING    Sample Expiration      07/28/2021,2359 Performed at [facility], [address] [place], [place] [address]   Type and screen Order type and screen if day of surgery is less than 15 days from draw of preadmission visit or order morning of surgery if day of surgery is greater than 6 days from preadmission visit.     Status: None   Collection Time: 07/27/21  3:38 PM  Result Value Ref Range   ABO/RH(D) O POS    Antibody Screen NEG    Sample Expiration      07/30/2021,2359 Performed at [facility], [address], [place], [place] [address]   I-Stat Chem 8, ED     Status: Abnormal   Collection Time: 07/27/21  3:44 PM  Result Value Ref Range   Sodium 140 135 - 145 mmol/L   Potassium 4.5 3.5 - 5.1 mmol/L   Chloride 109 98 - 111 mmol/L   BUN 26 (H) 8 - 23 mg/dL   Creatinine,  Ser 6.78 (H) 0.61 - 1.24 mg/dL   Glucose, Bld 938 (H) 70 - 99 mg/dL   Calcium, Ion 1.01 (L) 1.15 - 1.40 mmol/L   TCO2 22 22 - 32 mmol/L   Hemoglobin 15.3 13.0 - 17.0 g/dL   HCT 75.1 02.5 - 85.2 %  Surgical PCR screen     Status: None   Collection Time: 07/27/21  5:54 PM   Specimen: Nasal Mucosa; Nasal Swab  Result Value Ref Range   MRSA, PCR NEGATIVE NEGATIVE   Staphylococcus aureus NEGATIVE NEGATIVE  Comprehensive metabolic panel     Status: Abnormal   Collection Time: 07/28/21  4:23 AM  Result Value Ref Range   Sodium 133 (L) 135 - 145 mmol/L   Potassium 6.6 (HH) 3.5 - 5.1 mmol/L   Chloride 104 98 - 111 mmol/L   CO2 20 (L) 22 - 32 mmol/L   Glucose, Bld 162 (H) 70 - 99 mg/dL   BUN 28 (H) 8 - 23 mg/dL   Creatinine, Ser 7.78 (H) 0.61 - 1.24 mg/dL   Calcium 8.2 (L) 8.9 - 10.3 mg/dL   Total Protein 7.0 6.5 - 8.1 g/dL   Albumin 3.9 3.5 - 5.0 g/dL   AST 86 (H) 15 - 41 U/L   ALT 76 (H) 0 - 44 U/L   Alkaline Phosphatase 51 38 - 126 U/L   Total Bilirubin 0.9 0.3 - 1.2 mg/dL   GFR, Estimated 53 (L) >60 mL/min   Anion gap 9 5 - 15  CBC WITH DIFFERENTIAL     Status: Abnormal   Collection Time: 07/28/21  4:23 AM  Result Value Ref Range   WBC 12.7 (H) 4.0 - 10.5 K/uL   RBC 3.65 (L) 4.22 - 5.81 MIL/uL   Hemoglobin 12.8 (L) 13.0 - 17.0 g/dL   HCT 24.2 (L) 35.3 - 61.4 %   MCV 103.0 (H) 80.0 - 100.0 fL   MCH 35.1 (H) 26.0 - 34.0 pg   MCHC 34.0 30.0 - 36.0 g/dL   RDW 43.1 54.0 - 08.6 %   Platelets 149 (L) 150 - 400 K/uL   nRBC 0.0  0.0 - 0.2 %   Neutrophils Relative % 86 %   Neutro Abs 10.9 (H) 1.7 - 7.7 K/uL   Lymphocytes Relative 3 %   Lymphs Abs 0.4 (L) 0.7 - 4.0 K/uL   Monocytes Relative 10 %   Monocytes Absolute 1.3 (H) 0.1 - 1.0 K/uL   Eosinophils Relative 0 %   Eosinophils Absolute 0.0 0.0 - 0.5 K/uL   Basophils Relative 0 %   Basophils Absolute 0.0 0.0 - 0.1 K/uL   Immature Granulocytes 1 %   Abs Immature Granulocytes 0.09 (H) 0.00 - 0.07 K/uL  Urinalysis, Routine w  reflex microscopic     Status: Abnormal   Collection Time: 07/28/21  6:35 AM  Result Value Ref Range   Color, Urine YELLOW YELLOW   APPearance HAZY (A) CLEAR   Specific Gravity, Urine 1.044 (H) 1.005 - 1.030   pH 5.0 5.0 - 8.0   Glucose, UA NEGATIVE NEGATIVE mg/dL   Hgb urine dipstick LARGE (A) NEGATIVE   Bilirubin Urine NEGATIVE NEGATIVE   Ketones, ur NEGATIVE NEGATIVE mg/dL   Protein, ur 30 (A) NEGATIVE mg/dL   Nitrite NEGATIVE NEGATIVE   Leukocytes,Ua NEGATIVE NEGATIVE   RBC / HPF 21-50 0 - 5 RBC/hpf   WBC, UA 0-5 0 - 5 WBC/hpf   Bacteria, UA RARE (A) NONE SEEN   Squamous Epithelial / LPF 0-5 0 - 5   Mucus PRESENT    Granular Casts, UA PRESENT   Rapid urine drug screen (hospital performed)     Status: Abnormal   Collection Time: 07/28/21  6:35 AM  Result Value Ref Range   Opiates POSITIVE (A) NONE DETECTED   Cocaine NONE DETECTED NONE DETECTED   Benzodiazepines NONE DETECTED NONE DETECTED   Amphetamines NONE DETECTED NONE DETECTED   Tetrahydrocannabinol POSITIVE (A) NONE DETECTED   Barbiturates NONE DETECTED NONE DETECTED  Basic metabolic panel     Status: Abnormal   Collection Time: 07/28/21  7:06 AM  Result Value Ref Range   Sodium 134 (L) 135 - 145 mmol/L   Potassium 6.2 (H) 3.5 - 5.1 mmol/L   Chloride 103 98 - 111 mmol/L   CO2 23 22 - 32 mmol/L   Glucose, Bld 155 (H) 70 - 99 mg/dL   BUN 29 (H) 8 - 23 mg/dL   Creatinine, Ser 1.57 (H) 0.61 - 1.24 mg/dL   Calcium 8.1 (L) 8.9 - 10.3 mg/dL   GFR, Estimated 50 (L) >60 mL/min   Anion gap 8 5 - 15  I-STAT, chem 8     Status: Abnormal   Collection Time: 07/28/21  8:08 AM  Result Value Ref Range   Sodium 136 135 - 145 mmol/L   Potassium 6.6 (HH) 3.5 - 5.1 mmol/L   Chloride 104 98 - 111 mmol/L   BUN 32 (H) 8 - 23 mg/dL   Creatinine, Ser 2.62 (H) 0.61 - 1.24 mg/dL   Glucose, Bld 035 (H) 70 - 99 mg/dL   Calcium, Ion 5.97 4.16 - 1.40 mmol/L   TCO2 25 22 - 32 mmol/L   Hemoglobin 12.2 (L) 13.0 - 17.0 g/dL   HCT 38.4 (L)  53.6 - 52.0 %   Comment NOTIFIED PHYSICIAN   Glucose, capillary     Status: Abnormal   Collection Time: 07/28/21  9:07 AM  Result Value Ref Range   Glucose-Capillary 214 (H) 70 - 99 mg/dL    Assessment & Plan:  Present on Admission:  Fall    LOS: 1 day   Additional  comments:I reviewed the patient's new clinical lab test results.   and I reviewed the patients new imaging test results.    Fall off ladder   R PTX - CT placed in ED, CT to suction, IS, repeat CXR in AM R rib fractures - aggressive multimodal pain control, IS, pulm toilet. Improved O2 reqt but still work on pain management  Pelvic fx (L sacrum, R iliac, b/l inf and L sup pubic rami) with pelvic hematoma - ortho c/s, Dr. Carola Frost, notified at 1620, plan for SI screw, rescheduled to 11/10 due to hyperkalemia COPD - prn duonebs HTN Hx of Hep C GERD Hyperkalemia - insulin/D50, recheck at noon today, lokelma if still elevated  FEN: CLD DVT - SCDs, LMWH Dispo - ICU   Critical Care Total Time: 35 minutes  Diamantina Monks, MD Trauma & General Surgery Please use [email or web address] to contact on call provider  07/28/2021  *Care during the described time interval was provided by me. I have reviewed this patient's available data, including medical history, events of note, physical examination and test results as part of my evaluation.

## 2021-07-28 NOTE — Progress Notes (Signed)
PT Cancellation Note  Patient Details Name: [name] MRN: [id number] DOB: 1956/08/03   Cancelled Treatment:    Reason Eval/Treat Not Completed: Patient at procedure or test/unavailable Patient in OR for pelvic ring stabilization. Will re-attempt at later date.   Demia Viera A. Dan Humphreys PT, DPT Acute Rehabilitation Services Pager [phone] Office [phone]    Viviann Spare 07/28/2021, 7:49 AM

## 2021-07-28 NOTE — Procedures (Signed)
Arterial Catheter Insertion Procedure Note  JERRIS FLEER  [id number]  Jan 09, 1956  Date:07/28/21  Time:5:30 PM    Provider Performing: Suszanne Conners    Procedure: Insertion of Arterial Line (09628) without US guidance  Indication(s) Blood pressure monitoring and/or need for frequent ABGs  Consent Unable to obtain consent due to emergent nature of procedure.  Anesthesia None   Time Out Verified patient identification, verified procedure, site/side was marked, verified correct patient position, special equipment/implants available, medications/allergies/relevant history reviewed, required imaging and test results available.   Sterile Technique Maximal sterile technique including full sterile barrier drape, hand hygiene, sterile gown, sterile gloves, mask, hair covering, sterile ultrasound probe cover (if used).   Procedure Description Area of catheter insertion was cleaned with chlorhexidine and draped in sterile fashion. Without real-time ultrasound guidance an arterial catheter was placed into the right radial artery.  Appropriate arterial tracings confirmed on monitor.     Complications/Tolerance None; patient tolerated the procedure well.   EBL Minimal   Specimen(s) None

## 2021-07-28 NOTE — Progress Notes (Signed)
OT Cancellation Note  Patient Details Name: SILVIANO NEUSER MRN: [id number] DOB: [birth year]   Cancelled Treatment:    Reason Eval/Treat Not Completed: Patient not medically ready.  Pt with planned pelvic ring fixation 11/10 (cancelled for today due to hyperkalemia).  Will hold OT until post procedure.   Eber Jones., OTR/L Acute Rehabilitation Services Pager [phone] Office [phone]   Jeani Hawking M 07/28/2021, 10:03 AM

## 2021-07-28 NOTE — Progress Notes (Signed)
Critical lab value: Potassium 6.6.  Notified Trauma MD.  Awaiting orders.

## 2021-07-28 NOTE — Progress Notes (Addendum)
Pt's has increased WOB and BP is declining. RN placed pt on non-rebreather and called MD. Dr. Bedelia Person gave orders for bolis and she would be up to assess pt.    RN will continue to monitor.   Vanessa Ralphs, RN

## 2021-07-28 NOTE — TOC CAGE-AID Note (Signed)
Transition of Care [facility]) - CAGE-AID Screening   Patient Details  Name: [name] MRN: [id number] Date of Birth: 04/01/1956  Transition of [facility]) CM/SW Contact:    Makenzi Bannister C Tarpley-Carter, LCSWA Phone Number: 07/28/2021, 2:50 PM   Clinical Narrative: Pt is unable to participate in Cage Aid.   Jenya Putz Tarpley-Carter, MSW, LCSW-A Pronouns:  She/Her/Hers [facility]itions of Care Clinical Social Worker Direct Number:  [phone] [email or web address]  CAGE-AID Screening: Substance Abuse Screening unable to be completed due to: : Patient unable to participate             Substance Abuse Education Offered: No  Substance abuse interventions: Transport planner

## 2021-07-29 ENCOUNTER — Inpatient Hospital Stay (HOSPITAL_COMMUNITY): Payer: BC Managed Care – PPO | Admitting: Certified Registered"

## 2021-07-29 ENCOUNTER — Encounter (HOSPITAL_COMMUNITY)
Admission: EM | Disposition: A | Discharge status: Nursing Home (Includes ICF) | Payer: Self-pay | Source: Home / Self Care

## 2021-07-29 ENCOUNTER — Inpatient Hospital Stay: Payer: Self-pay

## 2021-07-29 ENCOUNTER — Inpatient Hospital Stay (HOSPITAL_COMMUNITY): Payer: BC Managed Care – PPO

## 2021-07-29 ENCOUNTER — Encounter (HOSPITAL_COMMUNITY): Payer: Self-pay

## 2021-07-29 HISTORY — PX: ORIF PELVIC FRACTURE WITH PERCUTANEOUS SCREWS: SHX6800

## 2021-07-29 LAB — BASIC METABOLIC PANEL
Anion gap: 5 (ref 5–15)
BUN: 29 mg/dL — ABNORMAL HIGH (ref 8–23)
CO2: 20 mmol/L — ABNORMAL LOW (ref 22–32)
Calcium: 7.6 mg/dL — ABNORMAL LOW (ref 8.9–10.3)
Chloride: 108 mmol/L (ref 98–111)
Creatinine, Ser: 1.08 mg/dL (ref 0.61–1.24)
GFR, Estimated: >60 mL/min (ref >60)
Glucose, Bld: 178 mg/dL — ABNORMAL HIGH (ref 70–99)
Potassium: 4.9 mmol/L (ref 3.5–5.1)
Sodium: 133 mmol/L — ABNORMAL LOW (ref 135–145)

## 2021-07-29 LAB — GLUCOSE, CAPILLARY
Glucose-Capillary: 196 mg/dL — ABNORMAL HIGH (ref 70–99)
Glucose-Capillary: 172 mg/dL — ABNORMAL HIGH (ref 70–99)
Glucose-Capillary: 219 mg/dL — ABNORMAL HIGH (ref 70–99)
Glucose-Capillary: 176 mg/dL — ABNORMAL HIGH (ref 70–99)
Glucose-Capillary: 169 mg/dL — ABNORMAL HIGH (ref 70–99)

## 2021-07-29 LAB — CBC
HCT: 29.3 % — ABNORMAL LOW (ref 39.0–52.0)
Hemoglobin: 9.7 g/dL — ABNORMAL LOW (ref 13.0–17.0)
MCH: 34.6 pg — ABNORMAL HIGH (ref 26.0–34.0)
MCHC: 33.1 g/dL (ref 30.0–36.0)
MCV: 104.6 fL — ABNORMAL HIGH (ref 80.0–100.0)
Platelets: 92 10*3/uL — ABNORMAL LOW (ref 150–400)
RBC: 2.8 MIL/uL — ABNORMAL LOW (ref 4.22–5.81)
RDW: 12.3 % (ref 11.5–15.5)
WBC: 7.7 10*3/uL (ref 4.0–10.5)
nRBC: 0 % (ref 0.0–0.2)

## 2021-07-29 LAB — HCV RNA QUANT RFLX ULTRA OR GENOTYP
HCV RNA Qnt(log copy/mL): UNDETERMINED log10 IU/mL
HepC Qn: NOT DETECTED IU/mL

## 2021-07-29 LAB — PHOSPHORUS: Phosphorus: 1.7 mg/dL — ABNORMAL LOW (ref 2.5–4.6)

## 2021-07-29 LAB — PREPARE RBC (CROSSMATCH)

## 2021-07-29 LAB — MAGNESIUM: Magnesium: 2 mg/dL (ref 1.7–2.4)

## 2021-07-29 SURGERY — CLOSED REDUCTION, PELVIS, WITH PERCUTANEOUS FIXATION
Anesthesia: General | Laterality: Right

## 2021-07-29 MED ORDER — PROSOURCE TF PO LIQD
45.0000 mL | Freq: Two times a day (BID) | ORAL | Status: DC
  Administered 2021-07-29: 45 mL
  Filled 2021-07-29: qty 45

## 2021-07-29 MED ORDER — MIDAZOLAM HCL 5 MG/5ML IJ SOLN
INTRAMUSCULAR | Status: DC | PRN
  Administered 2021-07-29 (×2): 2 mg via INTRAVENOUS

## 2021-07-29 MED ORDER — MIDAZOLAM HCL 2 MG/2ML IJ SOLN
INTRAMUSCULAR | Status: AC
  Filled 2021-07-29: qty 2

## 2021-07-29 MED ORDER — CEFAZOLIN SODIUM-DEXTROSE 2-3 GM-%(50ML) IV SOLR
INTRAVENOUS | Status: DC | PRN
  Administered 2021-07-29: 2 g via INTRAVENOUS

## 2021-07-29 MED ORDER — ROCURONIUM BROMIDE 100 MG/10ML IV SOLN
INTRAVENOUS | Status: DC | PRN
  Administered 2021-07-29: 60 mg via INTRAVENOUS

## 2021-07-29 MED ORDER — PROPOFOL 10 MG/ML IV BOLUS
INTRAVENOUS | Status: DC | PRN
  Administered 2021-07-29: 50 mg via INTRAVENOUS

## 2021-07-29 MED ORDER — SODIUM CHLORIDE 0.9% FLUSH
10.0000 mL | Freq: Two times a day (BID) | INTRAVENOUS | Status: DC
  Administered 2021-07-29: 30 mL
  Administered 2021-07-30: 20 mL
  Administered 2021-07-30 – 2021-08-03 (×8): 10 mL
  Administered 2021-08-03 – 2021-08-04 (×2): 30 mL
  Administered 2021-08-04 – 2021-08-08 (×7): 10 mL
  Administered 2021-08-09: 20 mL
  Administered 2021-08-09: 30 mL
  Administered 2021-08-10: 10 mL
  Administered 2021-08-10: 30 mL
  Administered 2021-08-11: 10 mL
  Administered 2021-08-11: 30 mL
  Administered 2021-08-12 – 2021-08-16 (×9): 10 mL
  Administered 2021-08-16: 21:00:00 30 mL
  Administered 2021-08-17 – 2021-08-19 (×6): 10 mL

## 2021-07-29 MED ORDER — LACTATED RINGERS IV SOLN: INTRAVENOUS | Status: DC | PRN

## 2021-07-29 MED ORDER — SODIUM CHLORIDE 0.9% FLUSH: 10.0000 mL | INTRAVENOUS | Status: DC | PRN

## 2021-07-29 MED ORDER — SPIRITUS FRUMENTI
1.0000 | Freq: Every day | ORAL | Status: DC
Start: 2021-07-30 — End: 2021-08-02
  Administered 2021-07-29 – 2021-08-01 (×4): 1
  Filled 2021-07-29 (×5): qty 1

## 2021-07-29 MED ORDER — IBUPROFEN 200 MG PO TABS
600.0000 mg | ORAL_TABLET | Freq: Four times a day (QID) | ORAL | Status: DC
  Administered 2021-07-29 – 2021-08-13 (×59): 600 mg
  Filled 2021-07-29 (×60): qty 3

## 2021-07-29 MED ORDER — SUGAMMADEX SODIUM 500 MG/5ML IV SOLN
INTRAVENOUS | Status: AC
  Filled 2021-07-29: qty 5

## 2021-07-29 MED ORDER — CEFAZOLIN SODIUM-DEXTROSE 2-4 GM/100ML-% IV SOLN
2.0000 g | Freq: Three times a day (TID) | INTRAVENOUS | Status: AC
  Administered 2021-07-29 – 2021-07-30 (×3): 2 g via INTRAVENOUS
  Filled 2021-07-29 (×3): qty 100

## 2021-07-29 MED ORDER — VITAL HIGH PROTEIN PO LIQD
1000.0000 mL | ORAL | Status: AC
  Administered 2021-07-29: 1000 mL

## 2021-07-29 MED ORDER — FENTANYL CITRATE (PF) 250 MCG/5ML IJ SOLN
INTRAMUSCULAR | Status: AC
  Filled 2021-07-29: qty 5

## 2021-07-29 MED ORDER — PROPOFOL 10 MG/ML IV BOLUS
INTRAVENOUS | Status: AC
  Filled 2021-07-29: qty 20

## 2021-07-29 MED ORDER — PHENYLEPHRINE 40 MCG/ML (10ML) SYRINGE FOR IV PUSH (FOR BLOOD PRESSURE SUPPORT)
PREFILLED_SYRINGE | INTRAVENOUS | Status: DC | PRN
  Administered 2021-07-29: 80 ug via INTRAVENOUS
  Administered 2021-07-29: 120 ug via INTRAVENOUS
  Administered 2021-07-29: 40 ug via INTRAVENOUS

## 2021-07-29 MED ORDER — SUGAMMADEX SODIUM 200 MG/2ML IV SOLN
INTRAVENOUS | Status: DC | PRN
  Administered 2021-07-29: 200 mg via INTRAVENOUS

## 2021-07-29 MED ORDER — DEXAMETHASONE SODIUM PHOSPHATE 10 MG/ML IJ SOLN
INTRAMUSCULAR | Status: DC | PRN
  Administered 2021-07-29: 10 mg via INTRAVENOUS

## 2021-07-29 MED ORDER — 0.9 % SODIUM CHLORIDE (POUR BTL) OPTIME
TOPICAL | Status: DC | PRN
  Administered 2021-07-29: 1000 mL

## 2021-07-29 MED ORDER — PIVOT 1.5 CAL PO LIQD
1000.0000 mL | ORAL | Status: DC
  Administered 2021-07-29 – 2021-08-08 (×8): 1000 mL

## 2021-07-29 SURGICAL SUPPLY — 43 items
BAG COUNTER SPONGE SURGICOUNT (BAG) ×2 IMPLANT
BAG SPNG CNTER NS LX DISP (BAG) ×1
BRUSH SCRUB EZ PLAIN DRY (MISCELLANEOUS) ×4 IMPLANT
COVER SURGICAL LIGHT HANDLE (MISCELLANEOUS) ×2 IMPLANT
DRAPE C-ARM 42X72 X-RAY (DRAPES) IMPLANT
DRAPE C-ARMOR (DRAPES) ×2 IMPLANT
DRAPE INCISE IOBAN 66X45 STRL (DRAPES) ×2 IMPLANT
DRAPE LAPAROTOMY TRNSV 102X78 (DRAPES) ×2 IMPLANT
DRAPE U-SHAPE 47X51 STRL (DRAPES) ×2 IMPLANT
DRESSING MEPILEX FLEX 4X4 (GAUZE/BANDAGES/DRESSINGS) ×1 IMPLANT
DRSG MEPILEX BORDER 4X4 (GAUZE/BANDAGES/DRESSINGS) ×2 IMPLANT
DRSG MEPILEX FLEX 4X4 (GAUZE/BANDAGES/DRESSINGS) ×2
ELECT REM PT RETURN 9FT ADLT (ELECTROSURGICAL) ×2
ELECTRODE REM PT RTRN 9FT ADLT (ELECTROSURGICAL) ×1 IMPLANT
GLOVE SRG 8 PF TXTR STRL LF DI (GLOVE) ×1 IMPLANT
GLOVE SURG ENC MOIS LTX SZ7.5 (GLOVE) ×2 IMPLANT
GLOVE SURG ENC MOIS LTX SZ8 (GLOVE) ×2 IMPLANT
GLOVE SURG UNDER POLY LF SZ7.5 (GLOVE) ×2 IMPLANT
GLOVE SURG UNDER POLY LF SZ8 (GLOVE) ×2
GOWN STRL REUS W/ TWL LRG LVL3 (GOWN DISPOSABLE) ×2 IMPLANT
GOWN STRL REUS W/ TWL XL LVL3 (GOWN DISPOSABLE) ×1 IMPLANT
GOWN STRL REUS W/TWL LRG LVL3 (GOWN DISPOSABLE) ×4
GOWN STRL REUS W/TWL XL LVL3 (GOWN DISPOSABLE) ×2
GUIDE PIN DRILL TIP 2.8X450HIP (PIN) ×6
KIT BASIN OR (CUSTOM PROCEDURE TRAY) ×2 IMPLANT
KIT TURNOVER KIT B (KITS) ×2 IMPLANT
MANIFOLD NEPTUNE II (INSTRUMENTS) ×2 IMPLANT
NS IRRIG 1000ML POUR BTL (IV SOLUTION) ×2 IMPLANT
PACK GENERAL/GYN (CUSTOM PROCEDURE TRAY) ×2 IMPLANT
PAD ARMBOARD 7.5X6 YLW CONV (MISCELLANEOUS) ×4 IMPLANT
PIN GUIDE DRILL TIP 2.8X450HIP (PIN) ×3 IMPLANT
SCREW CANN 8X140X40 (Screw) ×2 IMPLANT
SCREW CANN 8X145X40 (Screw) ×2 IMPLANT
SCREW CANN THRD TIMAX 8X180 (Screw) ×2 IMPLANT
STAPLER VISISTAT 35W (STAPLE) ×2 IMPLANT
SUT ETHILON 2 0 FS 18 (SUTURE) ×2 IMPLANT
SUT VIC AB 2-0 FS1 27 (SUTURE) ×2 IMPLANT
TOWEL GREEN STERILE (TOWEL DISPOSABLE) ×4 IMPLANT
TOWEL GREEN STERILE FF (TOWEL DISPOSABLE) ×2 IMPLANT
UNDERPAD 30X36 HEAVY ABSORB (UNDERPADS AND DIAPERS) ×2 IMPLANT
WASHER 8.0 (Orthopedic Implant) ×2 IMPLANT
WASHER CANN FLAT 8 (Orthopedic Implant) ×2 IMPLANT
WATER STERILE IRR 1000ML POUR (IV SOLUTION) ×2 IMPLANT

## 2021-07-29 NOTE — Procedures (Signed)
Intubation Procedure Note  [name]  [id number]  November 16, 1955  Date:07/29/21  Time:9:50 AM   Provider Performing:Anyesha N Monik Lins    Procedure: Intubation (31500)  Indication(s) Respiratory Failure  Consent Risks of the procedure as well as the alternatives and risks of each were explained to the patient and/or caregiver.  Consent for the procedure was obtained and is signed in the bedside chart   Anesthesia Rocuronium and ketamine   Time Out Verified patient identification, verified procedure, site/side was marked, verified correct patient position, special equipment/implants available, medications/allergies/relevant history reviewed, required imaging and test results available.   Sterile Technique Usual hand hygeine, masks, and gloves were used   Procedure Description Patient positioned in bed supine.  Sedation given as noted above.  Patient was intubated with endotracheal tube using Glidescope.  View was Grade 1 full glottis .  Number of attempts was 1.  Colorimetric CO2 detector was consistent with tracheal placement.   Complications/Tolerance None; patient tolerated the procedure well. Chest X-ray is ordered to verify placement. Post-procedural hypotension, responsive to fluid bolus, levophed ordered for PIV admin if persistent hypotension after bolus  EBL none   Specimen(s) None

## 2021-07-29 NOTE — Anesthesia Preprocedure Evaluation (Signed)
Anesthesia Evaluation  Patient identified by MRN, date of birth, ID band  Reviewed: Allergy & Precautions, Patient's Chart, lab work & pertinent test results  History of Anesthesia Complications Negative for: history of anesthetic complications  Airway Mallampati: Intubated       Dental   Pulmonary sleep apnea , COPD, former smoker,       + intubated    Cardiovascular hypertension,  Rhythm:Regular Rate:Tachycardia     Neuro/Psych negative neurological ROS     GI/Hepatic GERD  ,(+) Hepatitis -, C  Endo/Other  negative endocrine ROS  Renal/GU negative Renal ROS  negative genitourinary   Musculoskeletal negative musculoskeletal ROS (+)   Abdominal   Peds  Hematology  (+) anemia ,   Anesthesia Other Findings  S/p fall from ladder with multiple traumatic injuries including R PTX, rib fxs, pelvic fxs. Currently intubated and requiring Levophed. Had hyperkalemia yesterday which has now improved.   Reproductive/Obstetrics                           Anesthesia Physical Anesthesia Plan  ASA: 4  Anesthesia Plan: General   Post-op Pain Management:    Induction: Intravenous and Inhalational  PONV Risk Score and Plan: 2 and Treatment may vary due to age or medical condition and Midazolam  Airway Management Planned: Oral ETT  Additional Equipment:   Intra-op Plan:   Post-operative Plan: Post-operative intubation/ventilation  Informed Consent: I have reviewed the patients History and Physical, chart, labs and discussed the procedure including the risks, benefits and alternatives for the proposed anesthesia with the patient or authorized representative who has indicated his/her understanding and acceptance.     Consent reviewed with POA  Plan Discussed with:   Anesthesia Plan Comments:         Anesthesia Quick Evaluation

## 2021-07-29 NOTE — Progress Notes (Signed)
Trauma/Critical Care Follow Up Note  Subjective:    Overnight Issues:   Objective:  Vital signs for last 24 hours: Temp:  [97.2 F (36.2 C)-99.6 F (37.6 C)] 99 F (37.2 C) (11/10 0800) Pulse Rate:  [75-126] 96 (11/10 0930) Resp:  [10-33] 19 (11/10 0930) BP: (69-134)/(42-101) 122/72 (11/10 0930) SpO2:  [87 %-100 %] 93 % (11/10 0930) Arterial Line BP: (72-147)/(43-73) 123/63 (11/10 0930) FiO2 (%):  [40 %-100 %] 40 % (11/10 0815)  Hemodynamic parameters for last 24 hours:    Intake/Output from previous day: 11/09 0701 - 11/10 0700 In: 4040.8 [P.O.:60; I.V.:3980.8] Out: 1350 [Urine:1150; Chest Tube:200]  Intake/Output this shift: Total I/O In: 301.3 [I.V.:301.3] Out: 400 [Urine:400]  Vent settings for last 24 hours: Vent Mode: PSV;CPAP FiO2 (%):  [40 %-100 %] 40 % Set Rate:  [18 bmp-24 bmp] 24 bmp Vt Set:  [650 mL] 650 mL PEEP:  [5 cmH20] 5 cmH20 Pressure Support:  [15 cmH20] 15 cmH20 Plateau Pressure:  [14 cmH20-19 cmH20] 19 cmH20  Physical Exam:  Gen: comfortable, no distress Neuro: non-focal exam, interactive HEENT: PERRL Neck: supple CV: RRR Pulm: unlabored breathing on PSV Abd: soft, NT GU: clear yellow urine Extr: wwp, no edema   Results for orders placed or performed during the hospital encounter of 07/27/21 (from the past 24 hour(s))  Hemoglobin A1c     Status: None   Collection Time: 07/28/21 10:50 AM  Result Value Ref Range   Hgb A1c MFr Bld 5.4 4.8 - 5.6 %   Mean Plasma Glucose 108.28 mg/dL  Glucose, capillary     Status: Abnormal   Collection Time: 07/28/21 11:19 AM  Result Value Ref Range   Glucose-Capillary 110 (H) 70 - 99 mg/dL  Basic metabolic panel     Status: Abnormal   Collection Time: 07/28/21 12:02 PM  Result Value Ref Range   Sodium 134 (L) 135 - 145 mmol/L   Potassium 5.6 (H) 3.5 - 5.1 mmol/L   Chloride 103 98 - 111 mmol/L   CO2 23 22 - 32 mmol/L   Glucose, Bld 112 (H) 70 - 99 mg/dL   BUN 31 (H) 8 - 23 mg/dL   Creatinine,  Ser 6.38 (H) 0.61 - 1.24 mg/dL   Calcium 8.0 (L) 8.9 - 10.3 mg/dL   GFR, Estimated 41 (L) >60 mL/min   Anion gap 8 5 - 15  Glucose, capillary     Status: Abnormal   Collection Time: 07/28/21  4:10 PM  Result Value Ref Range   Glucose-Capillary 120 (H) 70 - 99 mg/dL  I-STAT 7, (LYTES, BLD GAS, ICA, H+H)     Status: Abnormal   Collection Time: 07/28/21  5:55 PM  Result Value Ref Range   pH, Arterial 7.166 (LL) 7.350 - 7.450   pCO2 arterial 63.3 (H) 32.0 - 48.0 mmHg   pO2, Arterial 217 (H) 83.0 - 108.0 mmHg   Bicarbonate 22.9 20.0 - 28.0 mmol/L   TCO2 25 22 - 32 mmol/L   O2 Saturation 100.0 %   Acid-base deficit 6.0 (H) 0.0 - 2.0 mmol/L   Sodium 136 135 - 145 mmol/L   Potassium 4.8 3.5 - 5.1 mmol/L   Calcium, Ion 1.14 (L) 1.15 - 1.40 mmol/L   HCT 30.0 (L) 39.0 - 52.0 %   Hemoglobin 10.2 (L) 13.0 - 17.0 g/dL   Patient temperature 17.7 F    Collection site Web designer by RT    Sample type ARTERIAL  Comment NOTIFIED PHYSICIAN   Basic metabolic panel     Status: Abnormal   Collection Time: 07/28/21  6:36 PM  Result Value Ref Range   Sodium 133 (L) 135 - 145 mmol/L   Potassium 5.5 (H) 3.5 - 5.1 mmol/L   Chloride 105 98 - 111 mmol/L   CO2 20 (L) 22 - 32 mmol/L   Glucose, Bld 118 (H) 70 - 99 mg/dL   BUN 32 (H) 8 - 23 mg/dL   Creatinine, Ser 1.82 (H) 0.61 - 1.24 mg/dL   Calcium 7.3 (L) 8.9 - 10.3 mg/dL   GFR, Estimated 46 (L) >60 mL/min   Anion gap 8 5 - 15  Glucose, capillary     Status: Abnormal   Collection Time: 07/28/21  7:18 PM  Result Value Ref Range   Glucose-Capillary 116 (H) 70 - 99 mg/dL  Glucose, capillary     Status: Abnormal   Collection Time: 07/28/21 11:02 PM  Result Value Ref Range   Glucose-Capillary 151 (H) 70 - 99 mg/dL  Glucose, capillary     Status: Abnormal   Collection Time: 07/29/21  3:05 AM  Result Value Ref Range   Glucose-Capillary 196 (H) 70 - 99 mg/dL  Basic metabolic panel     Status: Abnormal   Collection Time: 07/29/21  4:43 AM   Result Value Ref Range   Sodium 133 (L) 135 - 145 mmol/L   Potassium 4.9 3.5 - 5.1 mmol/L   Chloride 108 98 - 111 mmol/L   CO2 20 (L) 22 - 32 mmol/L   Glucose, Bld 178 (H) 70 - 99 mg/dL   BUN 29 (H) 8 - 23 mg/dL   Creatinine, Ser 9.93 0.61 - 1.24 mg/dL   Calcium 7.6 (L) 8.9 - 10.3 mg/dL   GFR, Estimated >71 >69 mL/min   Anion gap 5 5 - 15  CBC     Status: Abnormal   Collection Time: 07/29/21  4:43 AM  Result Value Ref Range   WBC 7.7 4.0 - 10.5 K/uL   RBC 2.80 (L) 4.22 - 5.81 MIL/uL   Hemoglobin 9.7 (L) 13.0 - 17.0 g/dL   HCT 67.8 (L) 93.8 - 10.1 %   MCV 104.6 (H) 80.0 - 100.0 fL   MCH 34.6 (H) 26.0 - 34.0 pg   MCHC 33.1 30.0 - 36.0 g/dL   RDW 75.1 02.5 - 85.2 %   Platelets 92 (L) 150 - 400 K/uL   nRBC 0.0 0.0 - 0.2 %  Glucose, capillary     Status: Abnormal   Collection Time: 07/29/21  7:47 AM  Result Value Ref Range   Glucose-Capillary 172 (H) 70 - 99 mg/dL    Assessment & Plan: The plan of care was discussed with the bedside nurse for the day, Jen L, who is in agreement with this plan and no additional concerns were raised.   Present on Admission:  Fall    LOS: 2 days   Additional comments:I reviewed the patient's new clinical lab test results.   and I reviewed the patients new imaging test results.    Fall off ladder   R PTX - CT placed in ED, CT to WS post-op, IS, repeat CXR in AM R rib fractures - aggressive multimodal pain control, IS, pulm toilet Pelvic fx (L sacrum, R iliac, b/l inf and L sup pubic rami) with pelvic hematoma - ortho c/s, Dr. Carola Frost, SI screw today VDRF - intubated 11/9, on PSV this AM, sats 91%. Assess for extubation 11/11 H/o  alcohol abuse - EtOH per tube COPD - prn duonebs HTN Hx of Hep C GERD Hyperkalemia - resolved, monitor  FEN: NPO, OGT, TF post-op DVT - SCDs, LMWH (hold today for procedure in combo with plts 92) Foley - place for retention today PICC - place today Dispo - ICU   Critical Care Total Time: 35 minutes  Diamantina Monks, MD Trauma & General Surgery Please use [email or web address] to contact on call provider  07/29/2021  *Care during the described time interval was provided by me. I have reviewed this patient's available data, including medical history, events of note, physical examination and test results as part of my evaluation.

## 2021-07-29 NOTE — Progress Notes (Signed)
Peripherally Inserted Central Catheter Placement  The IV Nurse has discussed with the patient and/or persons authorized to consent for the patient, the purpose of this procedure and the potential benefits and risks involved with this procedure.  The benefits include less needle sticks, lab draws from the catheter, and the patient may be discharged home with the catheter. Risks include, but not limited to, infection, bleeding, blood clot (thrombus formation), and puncture of an artery; nerve damage and irregular heartbeat and possibility to perform a PICC exchange if needed/ordered by physician.  Alternatives to this procedure were also discussed.  Bard Power PICC patient education guide, fact sheet on infection prevention and patient information card has been provided to patient /or left at bedside.    PICC Placement Documentation  PICC Triple Lumen 07/29/21 PICC Right Basilic 43 cm 0 cm (Active)  Indication for Insertion or Continuance of Line Vasoactive infusions 07/29/21 1635  Exposed Catheter (cm) 0 cm 07/29/21 1635  Site Assessment Clean;Dry;Intact 07/29/21 1635  Lumen #1 Status Flushed;Saline locked;Blood return noted 07/29/21 1635  Lumen #2 Status Blood return noted;Saline locked;Flushed 07/29/21 1635  Lumen #3 Status Blood return noted;Saline locked;Flushed 07/29/21 1635  Dressing Type Transparent 07/29/21 1635  Dressing Status Clean;Dry;Intact 07/29/21 1635  Antimicrobial disc in place? Yes 07/29/21 1635  Dressing Intervention New dressing;Other (Comment) 07/29/21 1635  Dressing Change Due 08/05/21 07/29/21 1635    Daughter signed consent at bedside   Maximino Greenland 07/29/2021, 4:36 PM

## 2021-07-29 NOTE — Op Note (Signed)
07/29/2021  12:45 PM  PATIENT:  [name]  18-May-1956 male   MEDICAL RECORD NUMBER: [id number]  PRE-OPERATIVE DIAGNOSIS:  UNSTABLE PELVIC RING FRACTURE  POST-OPERATIVE DIAGNOSIS:  UNSTABLE PELVIC RING FRACTURE  PROCEDURE:  Procedure(s): PERCUTANEOUS SACRO-ILIAC SCREW FIXATION, RIGHT AND LEFT (BILATERAL), OF THE POSTERIOR PELVIC RING  SURGEON:  Surgeon(s) and Role:    Myrene Galas, MD - Primary  PHYSICIAN ASSISTANT: None.  ANESTHESIA:   general  EBL:  15 mL   BLOOD ADMINISTERED:none  DRAINS: none   LOCAL MEDICATIONS USED:  NONE  SPECIMEN:  No Specimen  DISPOSITION OF SPECIMEN:  N/A  COUNTS:  YES  TOURNIQUET:  * No tourniquets in log *  DICTATION: .Note written in EPIC  PLAN OF CARE: Admit to inpatient   PATIENT DISPOSITION:  PACU - hemodynamically stable.   Delay start of Pharmacological VTE agent (>24hrs) due to surgical blood loss or risk of bleeding: no  BRIEF SUMMARY AND INDICATIONS FOR PROCEDURE:  Patient sustained an unstable pelvic ring injury and I saw and evaluated him in the trauma bay as a trauma activation. I discussed with the risks and benefits of the surgery with the patient including nonunion,, malunion, arthritis, loss of fixation, sexual dysfunction, pain, nerve injury, vessel injury, DVT, PE, and others.  We also specifically discussed urologic injury and footdrop. After full discussion, consent obtained and decision made to proceed. In the interim, however, the patient developed respiratory failure and has been intubated in the ICU.   BRIEF SUMMARY OF PROCEDURE:  The patient was taken to the operating room, where general anesthesia was induced.  The abdomen and flank were prepped and draped in the usual sterile fashion. Attention was turned first to the right SI joint, which was gapped open.  A true lateral view of S1 vertebral body was used to obtain the correct starting point and trajectory. The guide pin was advanced through the  iliac bone, across the right SI joint and into the S1 vetebral body. Before advancing the pin we checked its position on the inlet to make sure that it was posterior to the ala and anterior to the canal. We checked the outlet to make sure that it was superior to the S1 foramina and between the vertebral discs.  The guide pin was advanced. We then turned our attention to the left side of the posterior pelvis and further advanced the wire across the left ala, the SI joint, and to the outer cortex of the left ilium.  On this side we also checked  its position on the inlet to make sure that it was posterior to the ala and anterior to the canal, and the outlet to confirm superior to the S1 foramina and below the ala. The pin was measured for length, not drilled because the bone quality was rather poor, and then the screw with washer inserted. During both pin and screw placement I manipulated the pelvis to assist reduction.  Final images showed appropriate placement and length across both sides of the pelvis. An additional S2 screw was placed in similar manner, using the lateral for starting pint and trajectory. The pin was driven through the ilium, across one SI joint into the S2 vertebral body, and then driven across the opposite sacral body, SI joint, and ilium. We again used inlet and outlet views to carefully assume position in addition to final lateral view, as well. The screw with washer was then inserted after measuring and drilling. Excellent purchase was obtained and  the screw was again carefully checked for length and trajectory using inlet, outlet, and lateral views. Wounds were irrigated thoroughly and closed in standard fashion with nylon. Sterile dressings were applied.    PROGNOSIS:  With regard to mobilization, the patient will be NWB on the RLE for wight weeks.  Patient is at risk for SI joint arthrosis and pain. Patient will remain on the Trauma Service and may restart pharmacologic   DVT prophylaxis if no other contraindications.  Will follow closely.       Doralee Albino. Carola Frost, M.D.

## 2021-07-29 NOTE — Progress Notes (Signed)
Initial Nutrition Assessment  DOCUMENTATION CODES:   Not applicable  INTERVENTION:   Tube feeding via OG tube:  D/C Vital High Protein  Pivot 1.5 at 70 ml/h (1680 ml per day)  Provides 2520 kcal, 157 gm protein, 1275 ml free water daily    NUTRITION DIAGNOSIS:   Increased nutrient needs related to post-op healing as evidenced by estimated needs.  GOAL:   Patient will meet greater than or equal to 90% of their needs  MONITOR:   TF tolerance  REASON FOR ASSESSMENT:   Consult, Ventilator Enteral/tube feeding initiation and management  ASSESSMENT:   Pt with PMH of ETOH abuse, COPD, HTN, GERD, and hepatitis C admitted after fall from ladder with R PTX s/p CT, R rib fxs, and pelvic fx s/p fixation.    11/10 s/p screw fixation of pelvic ring   Patient is currently intubated on ventilator support Temp (24hrs), Avg:98.6 F (37 C), Min:97.5 F (36.4 C), Max:99.6 F (37.6 C)  Medications reviewed and include: colace, folic acid, MVI with minerals, miralax, vodka daily, thiamine  NS @ 100 ml/hr  Precedex  Fentanyl Levophed @ 1 mcg   Labs reviewed: Na 133 CBG's: 172-219  CT: 200 ml    Current TF Vital High Protein @ 40 ml/hr with 45 ml ProSource TF BID Provides: 1040 kcal and 106 grams protein   NUTRITION - FOCUSED PHYSICAL EXAM:  Flowsheet Row Most Recent Value  Orbital Region No depletion  Upper Arm Region No depletion  Thoracic and Lumbar Region No depletion  Buccal Region No depletion  Temple Region No depletion  Clavicle Bone Region No depletion  Clavicle and Acromion Bone Region No depletion  Scapular Bone Region No depletion  Dorsal Hand No depletion  Patellar Region No depletion  Anterior Thigh Region No depletion  Posterior Calf Region No depletion  Edema (RD Assessment) Mild  Hair Reviewed  Eyes Unable to assess  Mouth Unable to assess  Skin Reviewed  Nails Reviewed       Diet Order:   Diet Order             Diet NPO time  specified  Diet effective now                   EDUCATION NEEDS:   Not appropriate for education at this time  Skin:  Skin Assessment:  (R hip incision)  Last BM:  unknown  Height:   Ht Readings from Last 1 Encounters:  07/27/21 6\' 2"  (1.88 m)    Weight:   Wt Readings from Last 1 Encounters:  07/27/21 99.8 kg    BMI:  Body mass index is 28.25 kg/m.  Estimated Nutritional Needs:   Kcal:  2400-2600  Protein:  135-145 grams  Fluid:  > 2 L/day  13/08/22., RD, LDN, CNSC See AMiON for contact information

## 2021-07-29 NOTE — Progress Notes (Addendum)
Contacted on call Trauma MD due to pt becoming more hypotensive despite titrating Levophed gtt.  Levo gtt at max.  MD ordered Vasopressin 0.04 units/min.  Also informed MD of pt's CBG result of 151 without coverage.  No new orders given in regards to the CBG.  Will hang Vaso and continue to monitor.

## 2021-07-29 NOTE — Anesthesia Postprocedure Evaluation (Signed)
Anesthesia Post Note  Patient: [name]  Procedure(s) Performed: SI SCREW FIXATION OF RIGHT PELVIC RING (Right)     Patient location during evaluation: SICU Anesthesia Type: General Level of consciousness: sedated Pain management: pain level controlled Vital Signs Assessment: post-procedure vital signs reviewed and stable Respiratory status: patient remains intubated per anesthesia plan Cardiovascular status: stable Postop Assessment: no apparent nausea or vomiting Anesthetic complications: no   No notable events documented.  Last Vitals:  Vitals:   07/29/21 1245 07/29/21 1300  BP:  130/76  Pulse: 70 69  Resp: (!) 24 (!) 24  Temp:    SpO2: (!) 89% (!) 89%    Last Pain:  Vitals:   07/29/21 0800  TempSrc: Axillary  PainSc:                  Lucretia Kern

## 2021-07-29 NOTE — Progress Notes (Signed)
Patient seen and examined for worsening respiratory status. In impending respiratory failure at the time of my exam with tachypnea and accessory muscle use. Recommended intubation, patient agreeable. Discussed code status, patient desires full code. Patient designates daughter, Dian Situ, as sole decision-maker and she is at bedside, accepts this responsibility.   Critical care time:  Diamantina Monks, MD General and Trauma Surgery [facility] Surgery

## 2021-07-29 NOTE — Transfer of Care (Signed)
Immediate Anesthesia Transfer of Care Note  Patient: Kaymon W Shapley III  Procedure(s) Performed: SI SCREW FIXATION OF RIGHT PELVIC RING (Right)  Patient Location: ICU  Anesthesia Type:General  Level of Consciousness: Patient remains intubated per anesthesia plan  Airway & Oxygen Therapy: Patient remains intubated per anesthesia plan  Post-op Assessment: Report given to RN and Post -op Vital signs reviewed and stable  Post vital signs: Reviewed and stable  Last Vitals:  Vitals Value Taken Time  BP    Temp    Pulse 76 07/29/21 1220  Resp 25 07/29/21 1220  SpO2 91 % 07/29/21 1220  Vitals shown include unvalidated device data.  Last Pain:  Vitals:   07/29/21 0800  TempSrc: Axillary  PainSc:       Patients Stated Pain Goal: 9 (07/28/21 0245)  Complications: No notable events documented.

## 2021-07-30 ENCOUNTER — Inpatient Hospital Stay (HOSPITAL_COMMUNITY): Payer: BC Managed Care – PPO

## 2021-07-30 LAB — GLUCOSE, CAPILLARY
Glucose-Capillary: 198 mg/dL — ABNORMAL HIGH (ref 70–99)
Glucose-Capillary: 196 mg/dL — ABNORMAL HIGH (ref 70–99)
Glucose-Capillary: 142 mg/dL — ABNORMAL HIGH (ref 70–99)
Glucose-Capillary: 125 mg/dL — ABNORMAL HIGH (ref 70–99)
Glucose-Capillary: 173 mg/dL — ABNORMAL HIGH (ref 70–99)
Glucose-Capillary: 138 mg/dL — ABNORMAL HIGH (ref 70–99)

## 2021-07-30 LAB — PHOSPHORUS
Phosphorus: <1 mg/dL — CL (ref 2.5–4.6)
Phosphorus: 2.5 mg/dL (ref 2.5–4.6)

## 2021-07-30 LAB — BASIC METABOLIC PANEL
Anion gap: 5 (ref 5–15)
BUN: 21 mg/dL (ref 8–23)
CO2: 20 mmol/L — ABNORMAL LOW (ref 22–32)
Calcium: 7.6 mg/dL — ABNORMAL LOW (ref 8.9–10.3)
Chloride: 109 mmol/L (ref 98–111)
Creatinine, Ser: 0.83 mg/dL (ref 0.61–1.24)
GFR, Estimated: >60 mL/min (ref >60)
Glucose, Bld: 206 mg/dL — ABNORMAL HIGH (ref 70–99)
Potassium: 3.9 mmol/L (ref 3.5–5.1)
Sodium: 134 mmol/L — ABNORMAL LOW (ref 135–145)

## 2021-07-30 LAB — CBC
HCT: 24 % — ABNORMAL LOW (ref 39.0–52.0)
Hemoglobin: 7.9 g/dL — ABNORMAL LOW (ref 13.0–17.0)
MCH: 34.6 pg — ABNORMAL HIGH (ref 26.0–34.0)
MCHC: 32.9 g/dL (ref 30.0–36.0)
MCV: 105.3 fL — ABNORMAL HIGH (ref 80.0–100.0)
Platelets: 65 10*3/uL — ABNORMAL LOW (ref 150–400)
RBC: 2.28 MIL/uL — ABNORMAL LOW (ref 4.22–5.81)
RDW: 12.2 % (ref 11.5–15.5)
WBC: 3.2 10*3/uL — ABNORMAL LOW (ref 4.0–10.5)
nRBC: 0 % (ref 0.0–0.2)

## 2021-07-30 LAB — MAGNESIUM: Magnesium: 2.1 mg/dL (ref 1.7–2.4)

## 2021-07-30 MED ORDER — GUAIFENESIN 100 MG/5ML PO LIQD
10.0000 mL | ORAL | Status: DC
  Administered 2021-07-30 – 2021-08-30 (×183): 10 mL
  Filled 2021-07-30 (×11): qty 15
  Filled 2021-07-30: qty 10
  Filled 2021-07-30 (×14): qty 15
  Filled 2021-07-30: qty 10
  Filled 2021-07-30 (×21): qty 15
  Filled 2021-07-30: qty 10
  Filled 2021-07-30 (×92): qty 15
  Filled 2021-07-30: qty 10
  Filled 2021-07-30 (×33): qty 15
  Filled 2021-07-30: qty 10
  Filled 2021-07-30 (×8): qty 15

## 2021-07-30 MED ORDER — SODIUM PHOSPHATES 45 MMOLE/15ML IV SOLN: 15.0000 mmol | Freq: Once | INTRAVENOUS | Status: DC

## 2021-07-30 MED ORDER — CHLORDIAZEPOXIDE HCL 5 MG PO CAPS
5.0000 mg | ORAL_CAPSULE | Freq: Three times a day (TID) | ORAL | Status: DC
  Administered 2021-07-30 (×2): 5 mg
  Filled 2021-07-30 (×2): qty 1

## 2021-07-30 MED ORDER — SODIUM PHOSPHATES 45 MMOLE/15ML IV SOLN
30.0000 mmol | Freq: Once | INTRAVENOUS | Status: AC
  Administered 2021-07-30: 30 mmol via INTRAVENOUS
  Filled 2021-07-30: qty 10

## 2021-07-30 MED ORDER — SODIUM PHOSPHATES 45 MMOLE/15ML IV SOLN
15.0000 mmol | Freq: Once | INTRAVENOUS | Status: AC
  Administered 2021-07-30: 15 mmol via INTRAVENOUS
  Filled 2021-07-30: qty 5

## 2021-07-30 MED ORDER — OXYCODONE HCL 5 MG/5ML PO SOLN
5.0000 mg | ORAL | Status: DC | PRN
  Administered 2021-07-30 (×2): 5 mg
  Administered 2021-07-31 – 2021-08-09 (×13): 10 mg
  Administered 2021-08-11: 5 mg
  Administered 2021-08-11 – 2021-08-12 (×4): 10 mg
  Administered 2021-08-13: 5 mg
  Administered 2021-08-13 (×2): 10 mg
  Administered 2021-08-13: 5 mg
  Administered 2021-08-14 – 2021-08-19 (×11): 10 mg
  Administered 2021-08-20: 5 mg
  Administered 2021-08-20 – 2021-08-21 (×3): 10 mg
  Administered 2021-08-21: 5 mg
  Administered 2021-08-22 – 2021-08-23 (×3): 10 mg
  Administered 2021-08-23 – 2021-08-24 (×2): 5 mg
  Administered 2021-08-24 – 2021-08-25 (×2): 10 mg
  Administered 2021-08-25 (×2): 5 mg
  Administered 2021-08-25: 10 mg
  Administered 2021-08-25: 5 mg
  Administered 2021-08-26 – 2021-08-28 (×11): 10 mg
  Filled 2021-07-30: qty 10
  Filled 2021-07-30: qty 5
  Filled 2021-07-30 (×4): qty 10
  Filled 2021-07-30 (×2): qty 5
  Filled 2021-07-30: qty 10
  Filled 2021-07-30: qty 5
  Filled 2021-07-30 (×6): qty 10
  Filled 2021-07-30: qty 5
  Filled 2021-07-30: qty 10
  Filled 2021-07-30: qty 5
  Filled 2021-07-30 (×2): qty 10
  Filled 2021-07-30: qty 5
  Filled 2021-07-30: qty 10
  Filled 2021-07-30: qty 5
  Filled 2021-07-30 (×7): qty 10
  Filled 2021-07-30: qty 5
  Filled 2021-07-30 (×20): qty 10
  Filled 2021-07-30: qty 5
  Filled 2021-07-30 (×3): qty 10
  Filled 2021-07-30 (×2): qty 5
  Filled 2021-07-30 (×2): qty 10
  Filled 2021-07-30: qty 5
  Filled 2021-07-30: qty 10
  Filled 2021-07-30: qty 5
  Filled 2021-07-30: qty 10

## 2021-07-30 NOTE — Progress Notes (Signed)
Notified on call MD of critical lab result.  Phosphorus < 1.0.  Awaiting orders.

## 2021-07-30 NOTE — Progress Notes (Signed)
Pt placed back on full vent support due to increased RR, increased WOB and hypertension. Pt tolerating well at this time. RN aware, RT will continue to monitor

## 2021-07-30 NOTE — Progress Notes (Signed)
Orthopaedic Trauma Service Progress Note  Patient ID: [name] MRN: [id number] DOB/AGE: March 25, 1956 65 y.o.  Subjective:  Remains on vent   ROS  Objective:   VITALS:   Vitals:   07/30/21 0700 07/30/21 0736 07/30/21 0800 07/30/21 0819  BP: 131/84     Pulse: (!) 101     Resp: (!) 24     Temp:   99 F (37.2 C)   TempSrc:   Axillary   SpO2: 96% 97%  100%  Weight:      Height:        Estimated body mass index is 28.25 kg/m as calculated from the following:   Height as of this encounter: 6\' 2"  (1.88 m).   Weight as of this encounter: 99.8 kg.   Intake/Output      11/10 0701 11/11 0700 11/11 0701 11/12 0700   P.O.     I.V. (mL/kg) 3621 (36.3)    NG/GT 1421.7    IV Piggyback 259.5    Total Intake(mL/kg) 5302.2 (53.1)    Urine (mL/kg/hr) 1915 (0.8)    Blood 10    Chest Tube 110    Total Output 2035    Net +3267.2           LABS  Results for orders placed or performed during the hospital encounter of 07/27/21 (from the past 24 hour(s))  Prepare RBC (crossmatch)     Status: None   Collection Time: 07/29/21 10:19 AM  Result Value Ref Range   Order Confirmation      ORDER PROCESSED BY BLOOD BANK Performed at [facility], [address], [place], [place]   Glucose, capillary     Status: Abnormal   Collection Time: 07/29/21  3:11 PM  Result Value Ref Range   Glucose-Capillary 219 (H) 70 - 99 mg/dL   Comment 1 Notify RN    Comment 2 Document in Chart   Phosphorus     Status: Abnormal   Collection Time: 07/29/21  5:45 PM  Result Value Ref Range   Phosphorus 1.7 (L) 2.5 - 4.6 mg/dL  Magnesium     Status: None   Collection Time: 07/29/21  5:45 PM  Result Value Ref Range   Magnesium 2.0 1.7 - 2.4 mg/dL  Glucose, capillary     Status: Abnormal   Collection Time: 07/29/21  7:05 PM  Result Value Ref Range   Glucose-Capillary 176 (H) 70 - 99 mg/dL  Glucose,  capillary     Status: Abnormal   Collection Time: 07/29/21 11:06 PM  Result Value Ref Range   Glucose-Capillary 169 (H) 70 - 99 mg/dL  Glucose, capillary     Status: Abnormal   Collection Time: 07/30/21  3:34 AM  Result Value Ref Range   Glucose-Capillary 198 (H) 70 - 99 mg/dL  Phosphorus     Status: Abnormal   Collection Time: 07/30/21  3:45 AM  Result Value Ref Range   Phosphorus <1.0 (LL) 2.5 - 4.6 mg/dL  Magnesium     Status: None   Collection Time: 07/30/21  3:45 AM  Result Value Ref Range   Magnesium 2.1 1.7 - 2.4 mg/dL  CBC     Status: Abnormal   Collection Time: 07/30/21  6:12 AM  Result Value Ref Range   WBC 3.2 (L)  4.0 - 10.5 K/uL   RBC 2.28 (L) 4.22 - 5.81 MIL/uL   Hemoglobin 7.9 (L) 13.0 - 17.0 g/dL   HCT 00.3 (L) 70.4 - 88.8 %   MCV 105.3 (H) 80.0 - 100.0 fL   MCH 34.6 (H) 26.0 - 34.0 pg   MCHC 32.9 30.0 - 36.0 g/dL   RDW 91.6 94.5 - 03.8 %   Platelets 65 (L) 150 - 400 K/uL   nRBC 0.0 0.0 - 0.2 %  Basic metabolic panel     Status: Abnormal   Collection Time: 07/30/21  6:12 AM  Result Value Ref Range   Sodium 134 (L) 135 - 145 mmol/L   Potassium 3.9 3.5 - 5.1 mmol/L   Chloride 109 98 - 111 mmol/L   CO2 20 (L) 22 - 32 mmol/L   Glucose, Bld 206 (H) 70 - 99 mg/dL   BUN 21 8 - 23 mg/dL   Creatinine, Ser 8.82 0.61 - 1.24 mg/dL   Calcium 7.6 (L) 8.9 - 10.3 mg/dL   GFR, Estimated >80 >03 mL/min   Anion gap 5 5 - 15  Glucose, capillary     Status: Abnormal   Collection Time: 07/30/21  7:42 AM  Result Value Ref Range   Glucose-Capillary 196 (H) 70 - 99 mg/dL     PHYSICAL EXAM:   Gen: vent Pelvis: dressing R flank stable   Ext warm   + DP pulses B, symmetric   No asymmetric swelling   Assessment/Plan: 1 Day Post-Op     Anti-infectives (From admission, onward)    Start     Dose/Rate Route Frequency Ordered Stop   07/29/21 1800  ceFAZolin (ANCEF) IVPB 2g/100 mL premix        2 g 200 mL/hr over 30 Minutes Intravenous Every 8 hours 07/29/21 1241  07/30/21 1759   07/28/21 0815  ceFAZolin (ANCEF) IVPB 2g/100 mL premix  Status:  Discontinued        2 g 200 mL/hr over 30 Minutes Intravenous On call to O.R. 07/27/21 1744 07/28/21 0900     .  POD/HD#: 1  65 y/o male s/p fall with complex, unstable pelvic ring injury   - fall  - unstable pelvic ring fracture s/p Trans-sacral screw fixation (s1 and s2, R to L)  NWB R leg x 8 weeks  WBAT L leg for transfers only x 6 weeks  Therapies once stable   Dressing changes as needed starting tomorrow   - ABL anemia/Hemodynamics  Monitor  + thrombocytopenia   - Medical issues   Per TS  - DVT/PE prophylaxis:  SCDs  Dc'd Lovenox for now due to thrombocytopenia   - ID:   Periop abx   - Metabolic Bone Disease:  + vitamin d insufficiency   - Activity:  As above   - Impediments to fracture healing:  Vitamin d insufficiency   Marijuana use   - Dispo:  Ortho issue stable  Start therapies once medically stable     Mearl Latin, PA-C [phone] (C) 07/30/2021, 9:54 AM  [facility] [address] [place] [address] [phone] [phone] (F)    After 5pm and on the weekends please log on to Amion, go to orthopaedics and the look under the [facility] Call for the provider(s) on call. You can also call our office at [phone] and then follow the prompts to be connected to the call team.

## 2021-07-30 NOTE — Progress Notes (Signed)
Trauma/Critical Care Follow Up Note  Subjective:    Overnight Issues:   Objective:  Vital signs for last 24 hours: Temp:  [98.3 F (36.8 C)-99.2 F (37.3 C)] 99 F (37.2 C) (11/11 0800) Pulse Rate:  [65-121] 101 (11/11 0700) Resp:  [15-30] 24 (11/11 0700) BP: (100-137)/(62-86) 131/84 (11/11 0700) SpO2:  [88 %-100 %] 100 % (11/11 0819) Arterial Line BP: (90-311)/(5-77) 170/77 (11/11 0700) FiO2 (%):  [40 %] 40 % (11/11 0819)  Hemodynamic parameters for last 24 hours:    Intake/Output from previous day: 11/10 0701 - 11/11 0700 In: 5302.2 [I.V.:3621; NG/GT:1421.7; IV Piggyback:259.5] Out: 2035 [Urine:1915; Blood:10; Chest Tube:110]  Intake/Output this shift: No intake/output data recorded.  Vent settings for last 24 hours: Vent Mode: PSV;CPAP FiO2 (%):  [40 %] 40 % Set Rate:  [24 bmp] 24 bmp Vt Set:  [650 mL] 650 mL PEEP:  [5 cmH20] 5 cmH20 Pressure Support:  [5 cmH20] 5 cmH20 Plateau Pressure:  [16 cmH20-19 cmH20] 16 cmH20  Physical Exam:  Gen: comfortable, no distress Neuro: non-focal exam HEENT: PERRL Neck: supple CV: RRR Pulm: unlabored breathing Abd: soft, NT GU: clear yellow urine Extr: wwp, no edema   Results for orders placed or performed during the hospital encounter of 07/27/21 (from the past 24 hour(s))  Glucose, capillary     Status: Abnormal   Collection Time: 07/29/21  3:11 PM  Result Value Ref Range   Glucose-Capillary 219 (H) 70 - 99 mg/dL   Comment 1 Notify RN    Comment 2 Document in Chart   Phosphorus     Status: Abnormal   Collection Time: 07/29/21  5:45 PM  Result Value Ref Range   Phosphorus 1.7 (L) 2.5 - 4.6 mg/dL  Magnesium     Status: None   Collection Time: 07/29/21  5:45 PM  Result Value Ref Range   Magnesium 2.0 1.7 - 2.4 mg/dL  Glucose, capillary     Status: Abnormal   Collection Time: 07/29/21  7:05 PM  Result Value Ref Range   Glucose-Capillary 176 (H) 70 - 99 mg/dL  Glucose, capillary     Status: Abnormal    Collection Time: 07/29/21 11:06 PM  Result Value Ref Range   Glucose-Capillary 169 (H) 70 - 99 mg/dL  Glucose, capillary     Status: Abnormal   Collection Time: 07/30/21  3:34 AM  Result Value Ref Range   Glucose-Capillary 198 (H) 70 - 99 mg/dL  Phosphorus     Status: Abnormal   Collection Time: 07/30/21  3:45 AM  Result Value Ref Range   Phosphorus <1.0 (LL) 2.5 - 4.6 mg/dL  Magnesium     Status: None   Collection Time: 07/30/21  3:45 AM  Result Value Ref Range   Magnesium 2.1 1.7 - 2.4 mg/dL  CBC     Status: Abnormal   Collection Time: 07/30/21  6:12 AM  Result Value Ref Range   WBC 3.2 (L) 4.0 - 10.5 K/uL   RBC 2.28 (L) 4.22 - 5.81 MIL/uL   Hemoglobin 7.9 (L) 13.0 - 17.0 g/dL   HCT 99.3 (L) 57.0 - 17.7 %   MCV 105.3 (H) 80.0 - 100.0 fL   MCH 34.6 (H) 26.0 - 34.0 pg   MCHC 32.9 30.0 - 36.0 g/dL   RDW 93.9 03.0 - 09.2 %   Platelets 65 (L) 150 - 400 K/uL   nRBC 0.0 0.0 - 0.2 %  Basic metabolic panel     Status: Abnormal   Collection  Time: 07/30/21  6:12 AM  Result Value Ref Range   Sodium 134 (L) 135 - 145 mmol/L   Potassium 3.9 3.5 - 5.1 mmol/L   Chloride 109 98 - 111 mmol/L   CO2 20 (L) 22 - 32 mmol/L   Glucose, Bld 206 (H) 70 - 99 mg/dL   BUN 21 8 - 23 mg/dL   Creatinine, Ser 1.02 0.61 - 1.24 mg/dL   Calcium 7.6 (L) 8.9 - 10.3 mg/dL   GFR, Estimated >58 >52 mL/min   Anion gap 5 5 - 15  Glucose, capillary     Status: Abnormal   Collection Time: 07/30/21  7:42 AM  Result Value Ref Range   Glucose-Capillary 196 (H) 70 - 99 mg/dL    Assessment & Plan: The plan of care was discussed with the bedside nurse for the day, Traci, who is in agreement with this plan and no additional concerns were raised.   Present on Admission:  Fall    LOS: 3 days   Additional comments:I reviewed the patient's new clinical lab test results.   and I reviewed the patients new imaging test results.    Fall off ladder   R PTX - CT placed in ED, CT to WS, CXR pending R rib fractures -  aggressive multimodal pain control, IS, pulm toilet Pelvic fx (L sacrum, R iliac, b/l inf and L sup pubic rami) with pelvic hematoma - ortho c/s, Dr. Carola Frost, SI screw 11/10 VDRF - intubated 11/9, . Assess for extubation 11/11 H/o alcohol abuse - EtOH per tube, CIWA, TOC COPD - prn duonebs HTN Hx of Hep C GERD Shock - levophed off at 0230 this AM FEN: NPO, OGT, TF post-op DVT - SCDs, hold LMWH for worsening thrombocytopenia, check HIT Ab, replete hypophosphatemia, PM recheck Foley - placed for retention 11/10 PICC - placed 11/10 Dispo - ICU   Critical Care Total Time: 45 minutes  Diamantina Monks, MD Trauma & General Surgery Please use [email or web address] to contact on call provider  07/30/2021  *Care during the described time interval was provided by me. I have reviewed this patient's available data, including medical history, events of note, physical examination and test results as part of my evaluation.

## 2021-07-31 ENCOUNTER — Inpatient Hospital Stay (HOSPITAL_COMMUNITY): Payer: BC Managed Care – PPO | Admitting: Anesthesiology

## 2021-07-31 ENCOUNTER — Inpatient Hospital Stay (HOSPITAL_COMMUNITY): Payer: BC Managed Care – PPO

## 2021-07-31 LAB — POCT I-STAT 7, (LYTES, BLD GAS, ICA,H+H)
Acid-Base Excess: 0 mmol/L (ref 0.0–2.0)
Bicarbonate: 25.8 mmol/L (ref 20.0–28.0)
Calcium, Ion: 1.16 mmol/L (ref 1.15–1.40)
HCT: 24 % — ABNORMAL LOW (ref 39.0–52.0)
Hemoglobin: 8.2 g/dL — ABNORMAL LOW (ref 13.0–17.0)
O2 Saturation: 100 %
Patient temperature: 98.8
Potassium: 4.3 mmol/L (ref 3.5–5.1)
Sodium: 140 mmol/L (ref 135–145)
TCO2: 27 mmol/L (ref 22–32)
pCO2 arterial: 45.7 mmHg (ref 32.0–48.0)
pH, Arterial: 7.359 (ref 7.350–7.450)
pO2, Arterial: 374 mmHg — ABNORMAL HIGH (ref 83.0–108.0)

## 2021-07-31 LAB — HEPARIN INDUCED PLATELET AB (HIT ANTIBODY): Heparin Induced Plt Ab: 0.149 OD (ref 0.000–0.400)

## 2021-07-31 LAB — GLUCOSE, CAPILLARY
Glucose-Capillary: 144 mg/dL — ABNORMAL HIGH (ref 70–99)
Glucose-Capillary: 126 mg/dL — ABNORMAL HIGH (ref 70–99)
Glucose-Capillary: 128 mg/dL — ABNORMAL HIGH (ref 70–99)
Glucose-Capillary: 146 mg/dL — ABNORMAL HIGH (ref 70–99)
Glucose-Capillary: 182 mg/dL — ABNORMAL HIGH (ref 70–99)
Glucose-Capillary: 134 mg/dL — ABNORMAL HIGH (ref 70–99)

## 2021-07-31 LAB — BASIC METABOLIC PANEL
Anion gap: 5 (ref 5–15)
BUN: 23 mg/dL (ref 8–23)
CO2: 22 mmol/L (ref 22–32)
Calcium: 7.7 mg/dL — ABNORMAL LOW (ref 8.9–10.3)
Chloride: 111 mmol/L (ref 98–111)
Creatinine, Ser: 0.78 mg/dL (ref 0.61–1.24)
GFR, Estimated: >60 mL/min (ref >60)
Glucose, Bld: 107 mg/dL — ABNORMAL HIGH (ref 70–99)
Potassium: 4.3 mmol/L (ref 3.5–5.1)
Sodium: 138 mmol/L (ref 135–145)

## 2021-07-31 LAB — CBC
HCT: 22.8 % — ABNORMAL LOW (ref 39.0–52.0)
Hemoglobin: 7.3 g/dL — ABNORMAL LOW (ref 13.0–17.0)
MCH: 34 pg (ref 26.0–34.0)
MCHC: 32 g/dL (ref 30.0–36.0)
MCV: 106 fL — ABNORMAL HIGH (ref 80.0–100.0)
Platelets: 88 10*3/uL — ABNORMAL LOW (ref 150–400)
RBC: 2.15 MIL/uL — ABNORMAL LOW (ref 4.22–5.81)
RDW: 13.1 % (ref 11.5–15.5)
WBC: 3.4 10*3/uL — ABNORMAL LOW (ref 4.0–10.5)
nRBC: 0.6 % — ABNORMAL HIGH (ref 0.0–0.2)

## 2021-07-31 LAB — TYPE AND SCREEN
ABO/RH(D): O POS
Antibody Screen: NEGATIVE
Unit division: 0

## 2021-07-31 LAB — BPAM RBC
Blood Product Expiration Date: 202212032359
Unit Type and Rh: 5100

## 2021-07-31 LAB — PHOSPHORUS: Phosphorus: 1.5 mg/dL — ABNORMAL LOW (ref 2.5–4.6)

## 2021-07-31 MED ORDER — PROPOFOL 1000 MG/100ML IV EMUL
5.0000 ug/kg/min | INTRAVENOUS | Status: DC
  Administered 2021-07-31 (×2): 10 ug/kg/min via INTRAVENOUS
  Administered 2021-08-01 (×2): 20 ug/kg/min via INTRAVENOUS
  Administered 2021-08-01: 30 ug/kg/min via INTRAVENOUS
  Administered 2021-08-02: 20 ug/kg/min via INTRAVENOUS
  Administered 2021-08-02: 50 ug/kg/min via INTRAVENOUS
  Administered 2021-08-02: 40 ug/kg/min via INTRAVENOUS
  Administered 2021-08-03 (×5): 35 ug/kg/min via INTRAVENOUS
  Administered 2021-08-03: 30 ug/kg/min via INTRAVENOUS
  Administered 2021-08-04 (×2): 40 ug/kg/min via INTRAVENOUS
  Filled 2021-07-31 (×20): qty 100

## 2021-07-31 MED ORDER — SODIUM PHOSPHATES 45 MMOLE/15ML IV SOLN
45.0000 mmol | Freq: Once | INTRAVENOUS | Status: AC
  Administered 2021-07-31: 45 mmol via INTRAVENOUS
  Filled 2021-07-31: qty 15

## 2021-07-31 MED ORDER — DEXMEDETOMIDINE HCL IN NACL 400 MCG/100ML IV SOLN
0.0000 ug/kg/h | INTRAVENOUS | Status: DC
Start: 2021-07-31 — End: 2021-08-01
  Administered 2021-07-31: 0.9 ug/kg/h via INTRAVENOUS
  Administered 2021-08-01: 0.8 ug/kg/h via INTRAVENOUS
  Filled 2021-07-31 (×3): qty 100

## 2021-07-31 MED ORDER — PROPOFOL 10 MG/ML IV BOLUS
INTRAVENOUS | Status: DC | PRN
  Administered 2021-07-31: 100 mg via INTRAVENOUS

## 2021-07-31 MED ORDER — LORAZEPAM 2 MG/ML IJ SOLN
1.0000 mg | INTRAMUSCULAR | Status: AC | PRN
  Administered 2021-08-01 (×2): 4 mg via INTRAVENOUS
  Administered 2021-08-01 – 2021-08-02 (×3): 2 mg via INTRAVENOUS
  Filled 2021-07-31: qty 2
  Filled 2021-07-31 (×2): qty 1
  Filled 2021-07-31 (×2): qty 2

## 2021-07-31 MED ORDER — DEXAMETHASONE SODIUM PHOSPHATE 4 MG/ML IJ SOLN
4.0000 mg | Freq: Four times a day (QID) | INTRAMUSCULAR | Status: AC
  Administered 2021-07-31 – 2021-08-01 (×4): 4 mg via INTRAVENOUS
  Filled 2021-07-31 (×4): qty 1

## 2021-07-31 MED ORDER — LORAZEPAM 1 MG PO TABS: 1.0000 mg | ORAL_TABLET | ORAL | Status: AC | PRN

## 2021-07-31 MED ORDER — NOREPINEPHRINE 4 MG/250ML-% IV SOLN: 0.0000 ug/min | INTRAVENOUS | Status: DC

## 2021-07-31 MED ORDER — FUROSEMIDE 10 MG/ML IJ SOLN
40.0000 mg | Freq: Once | INTRAMUSCULAR | Status: AC
  Administered 2021-07-31: 40 mg via INTRAVENOUS
  Filled 2021-07-31: qty 4

## 2021-07-31 MED ORDER — CHLORDIAZEPOXIDE HCL 5 MG PO CAPS
10.0000 mg | ORAL_CAPSULE | Freq: Three times a day (TID) | ORAL | Status: DC
  Administered 2021-07-31 – 2021-08-01 (×6): 10 mg
  Filled 2021-07-31 (×6): qty 2

## 2021-07-31 MED ORDER — SUCCINYLCHOLINE CHLORIDE 200 MG/10ML IV SOSY
PREFILLED_SYRINGE | INTRAVENOUS | Status: DC | PRN
  Administered 2021-07-31: 120 mg via INTRAVENOUS

## 2021-07-31 MED ORDER — PANTOPRAZOLE 2 MG/ML SUSPENSION
40.0000 mg | Freq: Every day | ORAL | Status: DC
  Administered 2021-07-31 – 2021-08-30 (×30): 40 mg
  Filled 2021-07-31 (×30): qty 20

## 2021-07-31 MED ORDER — PHENYLEPHRINE 40 MCG/ML (10ML) SYRINGE FOR IV PUSH (FOR BLOOD PRESSURE SUPPORT)
PREFILLED_SYRINGE | INTRAVENOUS | Status: DC | PRN
  Administered 2021-07-31: 120 ug via INTRAVENOUS

## 2021-07-31 NOTE — Anesthesia Procedure Notes (Addendum)
Procedure Name: Intubation Date/Time: 07/31/2021 7:02 AM Performed by: Quentin Ore, CRNA Pre-anesthesia Checklist: Patient identified, Emergency Drugs available, Suction available and Patient being monitored Patient Re-evaluated:Patient Re-evaluated prior to induction Oxygen Delivery Method: Circle system utilized Preoxygenation: Pre-oxygenation with 100% oxygen Induction Type: IV induction Ventilation: Mask ventilation without difficulty Laryngoscope Size: Glidescope and 4 Grade View: Grade I Tube type: Oral Tube size: 7.5 mm Number of attempts: 1 Airway Equipment and Method: Stylet, Oral airway and Video-laryngoscopy Placement Confirmation: ETT inserted through vocal cords under direct vision, positive ETCO2 and breath sounds checked- equal and bilateral Secured at: 22 cm Tube secured with: Tape Dental Injury: Teeth and Oropharynx as per pre-operative assessment

## 2021-07-31 NOTE — Progress Notes (Signed)
Pt ET tube advanced from 22 to 25 cm measured from the lip due to leak. Pt tolerating well at this time. Chest X- ray done to confirm proper tube placement, MD aware, RN aware, vitals stable. RT will continue to monitor.

## 2021-07-31 NOTE — Progress Notes (Signed)
Trauma/Critical Care Follow Up Note  Subjective:    Overnight Issues:   Objective:  Vital signs for last 24 hours: Temp:  [98.3 F (36.8 C)-99.8 F (37.7 C)] 98.8 F (37.1 C) (11/12 0800) Pulse Rate:  [78-143] 91 (11/12 0800) Resp:  [15-36] 17 (11/12 0800) BP: (97-169)/(59-114) 130/85 (11/12 0800) SpO2:  [66 %-100 %] 100 % (11/12 0800) Arterial Line BP: (103-207)/(55-84) 187/84 (11/12 0600) FiO2 (%):  [40 %-100 %] 100 % (11/12 0716) Weight:  [114.7 kg] 114.7 kg (11/12 0400)  Hemodynamic parameters for last 24 hours:    Intake/Output from previous day: 11/11 0701 - 11/12 0700 In: 4226 [I.V.:1975.3; NG/GT:1770.2; IV Piggyback:480.5] Out: 1835 [Urine:1595; Chest Tube:240]  Intake/Output this shift: No intake/output data recorded.  Vent settings for last 24 hours: Vent Mode: PRVC FiO2 (%):  [40 %-100 %] 100 % Set Rate:  [15 bmp-24 bmp] 15 bmp Vt Set:  [650 mL] 650 mL PEEP:  [5 cmH20] 5 cmH20 Pressure Support:  [8 cmH20] 8 cmH20 Plateau Pressure:  [14 cmH20-21 cmH20] 14 cmH20  Physical Exam:  Gen: comfortable, no distress Neuro: non-focal exam HEENT: PERRL Neck: supple CV: RRR Pulm: unlabored breathing Abd: soft, NT GU: clear yellow urine Extr: wwp, no edema   Results for orders placed or performed during the hospital encounter of 07/27/21 (from the past 24 hour(s))  Glucose, capillary     Status: Abnormal   Collection Time: 07/30/21 11:08 AM  Result Value Ref Range   Glucose-Capillary 142 (H) 70 - 99 mg/dL   Comment 1 Notify RN    Comment 2 Document in Chart   Glucose, capillary     Status: Abnormal   Collection Time: 07/30/21  3:37 PM  Result Value Ref Range   Glucose-Capillary 125 (H) 70 - 99 mg/dL  Phosphorus     Status: None   Collection Time: 07/30/21  5:39 PM  Result Value Ref Range   Phosphorus 2.5 2.5 - 4.6 mg/dL  Glucose, capillary     Status: Abnormal   Collection Time: 07/30/21  7:43 PM  Result Value Ref Range   Glucose-Capillary 173 (H)  70 - 99 mg/dL  Glucose, capillary     Status: Abnormal   Collection Time: 07/30/21 11:12 PM  Result Value Ref Range   Glucose-Capillary 138 (H) 70 - 99 mg/dL  Glucose, capillary     Status: Abnormal   Collection Time: 07/31/21  3:35 AM  Result Value Ref Range   Glucose-Capillary 144 (H) 70 - 99 mg/dL  Phosphorus     Status: Abnormal   Collection Time: 07/31/21  5:27 AM  Result Value Ref Range   Phosphorus 1.5 (L) 2.5 - 4.6 mg/dL  Glucose, capillary     Status: Abnormal   Collection Time: 07/31/21  7:12 AM  Result Value Ref Range   Glucose-Capillary 126 (H) 70 - 99 mg/dL    Assessment & Plan: The plan of care was discussed with the bedside nurse for the day, Trish, who is in agreement with this plan and no additional concerns were raised.   Present on Admission:  Fall    LOS: 4 days   Additional comments:I reviewed the patient's new clinical lab test results.   and I reviewed the patients new imaging test results.    Fall off ladder   R PTX - CT placed in ED, CT to WS, o/p 240 R rib fractures - aggressive multimodal pain control, IS, pulm toilet Pelvic fx (L sacrum, R iliac, b/l inf and  L sup pubic rami) with pelvic hematoma - ortho c/s, Dr. Carola Frost, SI screw 11/10 VDRF - intubated 11/9, self-extubated and promptly re-intubated this AM. Mild airway edema on reintubation: decadron and lasix H/o alcohol abuse - EtOH per tube, CIWA, TOC, librium started yest, increase today COPD - prn duonebs HTN Hx of Hep C GERD Shock - resolved FEN: NPO, OGT, TF  DVT - SCDs, hold LMWH for worsening thrombocytopenia, pending HIT Ab, replete hypophosphatemia, PM recheck Foley - placed for retention 11/10 PICC - placed 11/10 Dispo - ICU   Critical Care Total Time: 40 minutes  Diamantina Monks, MD Trauma & General Surgery Please use [email or web address] to contact on call provider  07/31/2021  *Care during the described time interval was provided by me. I have reviewed this patient's available  data, including medical history, events of note, physical examination and test results as part of my evaluation.

## 2021-07-31 NOTE — Progress Notes (Signed)
Patient self-extubated at [phone] while this RN stepped was off the unit briefly. Patient was given oxy and a bolus of fentanyl prior to stepping off the unit. Patient had not attempted to pull the ETT all night. When this RN arrived back on the unit, RNs, Trauma MD, and anesthesia were at the bedside ready to re-intubate. Patient was breathing heavily on a non-rebreather 15L O2. Patient tolerated re-intubation well. The patient's daughter was called and updated about event. Report given to day RN.

## 2021-08-01 ENCOUNTER — Inpatient Hospital Stay (HOSPITAL_COMMUNITY): Payer: BC Managed Care – PPO

## 2021-08-01 LAB — CBC
HCT: 25.8 % — ABNORMAL LOW (ref 39.0–52.0)
Hemoglobin: 8.6 g/dL — ABNORMAL LOW (ref 13.0–17.0)
MCH: 35.1 pg — ABNORMAL HIGH (ref 26.0–34.0)
MCHC: 33.3 g/dL (ref 30.0–36.0)
MCV: 105.3 fL — ABNORMAL HIGH (ref 80.0–100.0)
Platelets: 125 10*3/uL — ABNORMAL LOW (ref 150–400)
RBC: 2.45 MIL/uL — ABNORMAL LOW (ref 4.22–5.81)
RDW: 12.9 % (ref 11.5–15.5)
WBC: 5.1 10*3/uL (ref 4.0–10.5)
nRBC: 1 % — ABNORMAL HIGH (ref 0.0–0.2)

## 2021-08-01 LAB — PHOSPHORUS: Phosphorus: 3.4 mg/dL (ref 2.5–4.6)

## 2021-08-01 LAB — BASIC METABOLIC PANEL
Anion gap: 8 (ref 5–15)
BUN: 28 mg/dL — ABNORMAL HIGH (ref 8–23)
CO2: 21 mmol/L — ABNORMAL LOW (ref 22–32)
Calcium: 8.1 mg/dL — ABNORMAL LOW (ref 8.9–10.3)
Chloride: 108 mmol/L (ref 98–111)
Creatinine, Ser: 0.73 mg/dL (ref 0.61–1.24)
GFR, Estimated: >60 mL/min (ref >60)
Glucose, Bld: 139 mg/dL — ABNORMAL HIGH (ref 70–99)
Potassium: 4.3 mmol/L (ref 3.5–5.1)
Sodium: 137 mmol/L (ref 135–145)

## 2021-08-01 LAB — GLUCOSE, CAPILLARY
Glucose-Capillary: 139 mg/dL — ABNORMAL HIGH (ref 70–99)
Glucose-Capillary: 146 mg/dL — ABNORMAL HIGH (ref 70–99)
Glucose-Capillary: 164 mg/dL — ABNORMAL HIGH (ref 70–99)
Glucose-Capillary: 161 mg/dL — ABNORMAL HIGH (ref 70–99)
Glucose-Capillary: 155 mg/dL — ABNORMAL HIGH (ref 70–99)
Glucose-Capillary: 137 mg/dL — ABNORMAL HIGH (ref 70–99)

## 2021-08-01 LAB — TRIGLYCERIDES: Triglycerides: 154 mg/dL — ABNORMAL HIGH (ref <150)

## 2021-08-01 MED ORDER — FUROSEMIDE 10 MG/ML IJ SOLN
60.0000 mg | Freq: Once | INTRAMUSCULAR | Status: AC
  Administered 2021-08-01: 60 mg via INTRAVENOUS
  Filled 2021-08-01: qty 6

## 2021-08-01 MED ORDER — POLYETHYLENE GLYCOL 3350 17 G PO PACK
17.0000 g | PACK | Freq: Two times a day (BID) | ORAL | Status: DC
  Administered 2021-08-01 (×2): 17 g
  Filled 2021-08-01 (×2): qty 1

## 2021-08-01 MED ORDER — SENNA 8.6 MG PO TABS
1.0000 | ORAL_TABLET | Freq: Every day | ORAL | Status: DC
  Administered 2021-08-01: 8.6 mg
  Filled 2021-08-01: qty 1

## 2021-08-01 MED ORDER — BISACODYL 10 MG RE SUPP
10.0000 mg | Freq: Once | RECTAL | Status: AC
  Administered 2021-08-01: 10 mg via RECTAL
  Filled 2021-08-01: qty 1

## 2021-08-01 NOTE — Progress Notes (Addendum)
Trauma/Critical Care Follow Up Note  Subjective:    Overnight Issues:   Objective:  Vital signs for last 24 hours: Temp:  [97.7 F (36.5 C)-98.8 F (37.1 C)] 97.8 F (36.6 C) (11/13 0800) Pulse Rate:  [43-89] 50 (11/13 0735) Resp:  [20-28] 24 (11/13 0735) BP: (93-153)/(62-111) 130/90 (11/13 0700) SpO2:  [96 %-100 %] 99 % (11/13 0735) Arterial Line BP: (102-199)/(57-115) 146/81 (11/13 0700) FiO2 (%):  [40 %-100 %] 40 % (11/13 0735) Weight:  [113.2 kg] 113.2 kg (11/13 0500)  Hemodynamic parameters for last 24 hours:    Intake/Output from previous day: 11/12 0701 - 11/13 0700 In: 2260.9 [I.V.:1996; IV Piggyback:264.9] Out: 2505 [Urine:2105; Emesis/NG output:250; Chest Tube:150]  Intake/Output this shift: No intake/output data recorded.  Vent settings for last 24 hours: Vent Mode: PRVC FiO2 (%):  [40 %-100 %] 40 % Set Rate:  [24 bmp] 24 bmp Vt Set:  [650 mL] 650 mL PEEP:  [5 cmH20] 5 cmH20 Plateau Pressure:  [17 cmH20-21 cmH20] 20 cmH20  Physical Exam:  Gen: comfortable, no distress Neuro: non-focal exam HEENT: PERRL Neck: supple CV: RRR Pulm: unlabored breathing Abd: soft, NT GU: clear yellow urine Extr: wwp, no edema   Results for orders placed or performed during the hospital encounter of 07/27/21 (from the past 24 hour(s))  I-STAT 7, (LYTES, BLD GAS, ICA, H+H)     Status: Abnormal   Collection Time: 07/31/21  9:11 AM  Result Value Ref Range   pH, Arterial 7.359 7.350 - 7.450   pCO2 arterial 45.7 32.0 - 48.0 mmHg   pO2, Arterial 374 (H) 83.0 - 108.0 mmHg   Bicarbonate 25.8 20.0 - 28.0 mmol/L   TCO2 27 22 - 32 mmol/L   O2 Saturation 100.0 %   Acid-Base Excess 0.0 0.0 - 2.0 mmol/L   Sodium 140 135 - 145 mmol/L   Potassium 4.3 3.5 - 5.1 mmol/L   Calcium, Ion 1.16 1.15 - 1.40 mmol/L   HCT 24.0 (L) 39.0 - 52.0 %   Hemoglobin 8.2 (L) 13.0 - 17.0 g/dL   Patient temperature 40.3 F    Collection site art line    Drawn by RT    Sample type ARTERIAL   CBC      Status: Abnormal   Collection Time: 07/31/21  9:35 AM  Result Value Ref Range   WBC 3.4 (L) 4.0 - 10.5 K/uL   RBC 2.15 (L) 4.22 - 5.81 MIL/uL   Hemoglobin 7.3 (L) 13.0 - 17.0 g/dL   HCT 47.4 (L) 25.9 - 56.3 %   MCV 106.0 (H) 80.0 - 100.0 fL   MCH 34.0 26.0 - 34.0 pg   MCHC 32.0 30.0 - 36.0 g/dL   RDW 87.5 64.3 - 32.9 %   Platelets 88 (L) 150 - 400 K/uL   nRBC 0.6 (H) 0.0 - 0.2 %  Basic metabolic panel     Status: Abnormal   Collection Time: 07/31/21  9:35 AM  Result Value Ref Range   Sodium 138 135 - 145 mmol/L   Potassium 4.3 3.5 - 5.1 mmol/L   Chloride 111 98 - 111 mmol/L   CO2 22 22 - 32 mmol/L   Glucose, Bld 107 (H) 70 - 99 mg/dL   BUN 23 8 - 23 mg/dL   Creatinine, Ser 5.18 0.61 - 1.24 mg/dL   Calcium 7.7 (L) 8.9 - 10.3 mg/dL   GFR, Estimated >84 >16 mL/min   Anion gap 5 5 - 15  Glucose, capillary  Status: Abnormal   Collection Time: 07/31/21 12:02 PM  Result Value Ref Range   Glucose-Capillary 128 (H) 70 - 99 mg/dL  Glucose, capillary     Status: Abnormal   Collection Time: 07/31/21  4:18 PM  Result Value Ref Range   Glucose-Capillary 146 (H) 70 - 99 mg/dL  Glucose, capillary     Status: Abnormal   Collection Time: 07/31/21  7:23 PM  Result Value Ref Range   Glucose-Capillary 182 (H) 70 - 99 mg/dL  Glucose, capillary     Status: Abnormal   Collection Time: 07/31/21 11:25 PM  Result Value Ref Range   Glucose-Capillary 134 (H) 70 - 99 mg/dL  Glucose, capillary     Status: Abnormal   Collection Time: 08/01/21  4:13 AM  Result Value Ref Range   Glucose-Capillary 139 (H) 70 - 99 mg/dL  Triglycerides     Status: Abnormal   Collection Time: 08/01/21  5:18 AM  Result Value Ref Range   Triglycerides 154 (H) <150 mg/dL  Glucose, capillary     Status: Abnormal   Collection Time: 08/01/21  7:16 AM  Result Value Ref Range   Glucose-Capillary 146 (H) 70 - 99 mg/dL    Assessment & Plan: The plan of care was discussed with the bedside nurse for the day, Trish, who  is in agreement with this plan and no additional concerns were raised.   Present on Admission:  Fall    LOS: 5 days   Additional comments:I reviewed the patient's new clinical lab test results.   and I reviewed the patients new imaging test results.    Fall off ladder   R PTX - CT placed in ED, CT to WS, o/p 240 R rib fractures - aggressive multimodal pain control, IS, pulm toilet Pelvic fx (L sacrum, R iliac, b/l inf and L sup pubic rami) with pelvic hematoma - ortho c/s, Dr. Carola Frost, SI screw 11/10 VDRF - intubated 11/9, self-extubated and promptly re-intubated 11/12 AM. Repeat lasix. Possible extubation today.  H/o alcohol abuse - EtOH per tube, CIWA, TOC, librium started yest, increase today COPD - prn duonebs HTN Hx of Hep C FEN: NPO, OGT, TF  DVT - SCDs, hold LMWH thrombocytopenia, HIT Ab negative Foley - placed for retention 11/10 PICC - placed 11/10 Dispo - ICU   Critical Care Total Time: 40 minutes  Diamantina Monks, MD Trauma & General Surgery Please use [email or web address] to contact on call provider  08/01/2021  *Care during the described time interval was provided by me. I have reviewed this patient's available data, including medical history, events of note, physical examination and test results as part of my evaluation.

## 2021-08-01 NOTE — Progress Notes (Signed)
Attempted weaning trial X2 today. Patient tolerated first trial approximately 1.5 hours and second attempt 45 minutes. Patient became diaphoretic, tachycardic  HR 116, hypertensive to SBP 200's via Art line, and tachypneic RR 35 with increased WOB. Medication gtts adjusted as appropriate, Precedex restarted temporarily and PRN ativan given with no relief. Dr. Bedelia Person and RT aware, flipped back to full support. Vital signs now stable, patient resting comfortably.

## 2021-08-01 NOTE — Progress Notes (Signed)
Pt placed back on full vent support due to hypertension, tachypnea, and increased WOB. Pt tolerating well at this time. RN aware, MD aware, RT will continue to monitor.

## 2021-08-02 ENCOUNTER — Inpatient Hospital Stay (HOSPITAL_COMMUNITY): Payer: BC Managed Care – PPO

## 2021-08-02 LAB — BASIC METABOLIC PANEL
Anion gap: 7 (ref 5–15)
BUN: 32 mg/dL — ABNORMAL HIGH (ref 8–23)
CO2: 24 mmol/L (ref 22–32)
Calcium: 7.9 mg/dL — ABNORMAL LOW (ref 8.9–10.3)
Chloride: 107 mmol/L (ref 98–111)
Creatinine, Ser: 0.78 mg/dL (ref 0.61–1.24)
GFR, Estimated: >60 mL/min (ref >60)
Glucose, Bld: 158 mg/dL — ABNORMAL HIGH (ref 70–99)
Potassium: 3.4 mmol/L — ABNORMAL LOW (ref 3.5–5.1)
Sodium: 138 mmol/L (ref 135–145)

## 2021-08-02 LAB — CBC
HCT: 24.9 % — ABNORMAL LOW (ref 39.0–52.0)
Hemoglobin: 8.2 g/dL — ABNORMAL LOW (ref 13.0–17.0)
MCH: 34.9 pg — ABNORMAL HIGH (ref 26.0–34.0)
MCHC: 32.9 g/dL (ref 30.0–36.0)
MCV: 106 fL — ABNORMAL HIGH (ref 80.0–100.0)
Platelets: 145 10*3/uL — ABNORMAL LOW (ref 150–400)
RBC: 2.35 MIL/uL — ABNORMAL LOW (ref 4.22–5.81)
RDW: 13.1 % (ref 11.5–15.5)
WBC: 6.7 10*3/uL (ref 4.0–10.5)
nRBC: 1.3 % — ABNORMAL HIGH (ref 0.0–0.2)

## 2021-08-02 LAB — POCT I-STAT 7, (LYTES, BLD GAS, ICA,H+H)
Acid-Base Excess: 0 mmol/L (ref 0.0–2.0)
Bicarbonate: 25 mmol/L (ref 20.0–28.0)
Calcium, Ion: 1.17 mmol/L (ref 1.15–1.40)
HCT: 21 % — ABNORMAL LOW (ref 39.0–52.0)
Hemoglobin: 7.1 g/dL — ABNORMAL LOW (ref 13.0–17.0)
O2 Saturation: 97 %
Patient temperature: 98.6
Potassium: 3.6 mmol/L (ref 3.5–5.1)
Sodium: 139 mmol/L (ref 135–145)
TCO2: 26 mmol/L (ref 22–32)
pCO2 arterial: 40.5 mmHg (ref 32.0–48.0)
pH, Arterial: 7.398 (ref 7.350–7.450)
pO2, Arterial: 90 mmHg (ref 83.0–108.0)

## 2021-08-02 LAB — GLUCOSE, CAPILLARY
Glucose-Capillary: 148 mg/dL — ABNORMAL HIGH (ref 70–99)
Glucose-Capillary: 140 mg/dL — ABNORMAL HIGH (ref 70–99)
Glucose-Capillary: 142 mg/dL — ABNORMAL HIGH (ref 70–99)
Glucose-Capillary: 151 mg/dL — ABNORMAL HIGH (ref 70–99)
Glucose-Capillary: 154 mg/dL — ABNORMAL HIGH (ref 70–99)
Glucose-Capillary: 155 mg/dL — ABNORMAL HIGH (ref 70–99)

## 2021-08-02 LAB — PHOSPHORUS: Phosphorus: 2.6 mg/dL (ref 2.5–4.6)

## 2021-08-02 MED ORDER — ENOXAPARIN SODIUM 40 MG/0.4ML IJ SOSY
40.0000 mg | PREFILLED_SYRINGE | Freq: Two times a day (BID) | INTRAMUSCULAR | Status: DC
  Administered 2021-08-02 – 2021-08-30 (×55): 40 mg via SUBCUTANEOUS
  Filled 2021-08-02 (×55): qty 0.4

## 2021-08-02 MED ORDER — POTASSIUM CHLORIDE 20 MEQ PO PACK
40.0000 meq | PACK | ORAL | Status: AC
  Administered 2021-08-02 (×2): 40 meq
  Filled 2021-08-02 (×2): qty 2

## 2021-08-02 MED ORDER — POLYETHYLENE GLYCOL 3350 17 G PO PACK
17.0000 g | PACK | Freq: Every day | ORAL | Status: DC
  Administered 2021-08-03 – 2021-08-07 (×4): 17 g
  Filled 2021-08-02 (×5): qty 1

## 2021-08-02 MED ORDER — POTASSIUM CHLORIDE 20 MEQ PO PACK
40.0000 meq | PACK | Freq: Once | ORAL | Status: DC
  Filled 2021-08-02: qty 2

## 2021-08-02 MED ORDER — POTASSIUM CHLORIDE 20 MEQ PO PACK
40.0000 meq | PACK | Freq: Once | ORAL | Status: AC
  Administered 2021-08-02: 40 meq

## 2021-08-02 MED ORDER — POTASSIUM CHLORIDE 20 MEQ PO PACK: 20.0000 meq | PACK | Freq: Two times a day (BID) | ORAL | Status: DC

## 2021-08-02 MED ORDER — CHLORDIAZEPOXIDE HCL 25 MG PO CAPS
25.0000 mg | ORAL_CAPSULE | Freq: Three times a day (TID) | ORAL | Status: DC
  Administered 2021-08-02 – 2021-08-05 (×11): 25 mg
  Filled 2021-08-02 (×12): qty 1

## 2021-08-02 MED ORDER — MIDAZOLAM HCL 2 MG/2ML IJ SOLN: 2.0000 mg | Freq: Once | INTRAMUSCULAR | Status: DC

## 2021-08-02 MED ORDER — IPRATROPIUM-ALBUTEROL 0.5-2.5 (3) MG/3ML IN SOLN
3.0000 mL | Freq: Four times a day (QID) | RESPIRATORY_TRACT | Status: DC
  Administered 2021-08-02 – 2021-08-03 (×5): 3 mL via RESPIRATORY_TRACT
  Filled 2021-08-02 (×5): qty 3

## 2021-08-02 MED ORDER — LISINOPRIL 20 MG PO TABS
20.0000 mg | ORAL_TABLET | Freq: Every day | ORAL | Status: DC
  Administered 2021-08-02 – 2021-08-04 (×3): 20 mg
  Filled 2021-08-02 (×4): qty 1

## 2021-08-02 MED ORDER — SPIRITUS FRUMENTI
1.0000 | Freq: Three times a day (TID) | ORAL | Status: DC
Start: 2021-08-02 — End: 2021-08-05
  Administered 2021-08-02 – 2021-08-04 (×9): 1
  Filled 2021-08-02 (×12): qty 1

## 2021-08-02 MED ORDER — FUROSEMIDE 10 MG/ML IJ SOLN
60.0000 mg | Freq: Once | INTRAMUSCULAR | Status: AC
  Administered 2021-08-02: 60 mg via INTRAVENOUS
  Filled 2021-08-02: qty 6

## 2021-08-02 NOTE — TOC Initial Note (Signed)
Transition of Care [facility]) - Initial/Assessment Note    Patient Details  Name: [name] MRN: [id number] Date of Birth: [birth year]  [facility]) CM/SW Contact:    Ella Bodo, RN Phone Number: 08/02/2021, 1140 Clinical Narrative:  Patient admitted on 07/27/2021 after falling off a ladder; he sustained right pneumothorax, right rib fractures, and pelvic fractures.  Prior to admission, patient independent and living at home alone.  He has supportive daughter and sister; sister currently at bedside.               Met with Worker's [employer]. case manager at bedside; she will be following patient's case throughout hospital stay. Contact info: Germaine Pomfret with [employer] Phone: [phone] Fax: [phone] Email: [email or web address]   Pt remains sedated on ventilator; weaning attempts in progress. Contracted to provide weekly updates to [facility] Case Manager.    Expected Discharge Plan: IP Rehab Facility Barriers to Discharge: Continued Medical Work up          Expected Discharge Plan and Services Expected Discharge Plan: [place]   Discharge Planning Services: CM Consult   Living arrangements for the past 2 months: Single Family Home                                      Prior Living Arrangements/Services Living arrangements for the past 2 months: Single Family Home Lives with:: Self Patient language and need for interpreter reviewed:: Yes        Need for Family Participation in Patient Care: Yes (Comment) Care giver support system in place?: Yes (comment)   Criminal Activity/Legal Involvement Pertinent to Current Situation/Hospitalization: No - Comment as needed  Activities of Daily Living Home Assistive Devices/Equipment: None ADL Screening (condition at time of admission) Patient's cognitive ability adequate to safely complete daily activities?: Yes Is the patient deaf or have difficulty hearing?: No Does the patient  have difficulty seeing, even when wearing glasses/contacts?: No Does the patient have difficulty concentrating, remembering, or making decisions?: No Patient able to express need for assistance with ADLs?: No Does the patient have difficulty dressing or bathing?: No Independently performs ADLs?: No Communication: Independent Dressing (OT): Needs assistance Is this a change from baseline?: Change from baseline, expected to last >3 days Grooming: Needs assistance Is this a change from baseline?: Change from baseline, expected to last <3 days Feeding: Independent Bathing: Needs assistance Is this a change from baseline?: Change from baseline, expected to last <3 days Toileting: Needs assistance Is this a change from baseline?: Change from baseline, expected to last <3 days In/Out Bed: Needs assistance Is this a change from baseline?: Change from baseline, expected to last <3 days Walks in Home: Needs assistance Is this a change from baseline?: Change from baseline, expected to last <3 days Does the patient have difficulty walking or climbing stairs?: Yes Weakness of Legs: None Weakness of Arms/Hands: None                 Emotional Assessment Appearance:: Appears stated age Attitude/Demeanor/Rapport: Intubated (Following Commands or Not Following Commands) Affect (typically observed): Unable to Assess        Admission diagnosis:  Trauma [T14.90XA] Fall [W19.XXXA] Hemopneumothorax on right [J94.2] Pneumothorax on right [J93.9] Closed fracture of multiple ribs of right side, initial encounter [S22.41XA] Multiple closed fractures of pelvis with stable disruption of pelvic ring, initial encounter [facility]) [S32.810A] Patient Active Problem List  Diagnosis Date Noted   Fall 07/27/2021   Screening for prostate cancer 01/27/2021   Elevated LFTs 01/27/2021   Former smoker 01/27/2021   Chronic respiratory failure with hypoxia (HCC) 11/08/2020   Obstructive sleep apnea hypopnea,  severe 09/24/2020   Exudative age-related macular degeneration of left eye with active choroidal neovascularization (HCC) 01/28/2020   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   Exudative age-related macular degeneration of right eye with active choroidal neovascularization (HCC) 01/28/2020   CAD (coronary artery disease) 10/16/2019   S/P reverse total shoulder arthroplasty, 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   W. , RN, BSN  Trauma/Neuro ICU Case Manager [phone]  

## 2021-08-02 NOTE — Progress Notes (Signed)
After turning propofol off (40/hr)and patient was on wean mode for around 30 minutes - patient became tachycardic in the 110s, SBP >220, and became agitated. Patient did follow commands.  Per verbal order by Dr Bedelia Person, resume propofol a 20/hr and give metoprolol and versed IV push.  Will continue to monitor patient.  Sherral Hammers, RN

## 2021-08-02 NOTE — Progress Notes (Signed)
RT called to pts room due to pts vent alarming. Pt was dyssynchronous with the vent and auto peeping. RN gave a push of sedation. RT changed Itime and decreased RR. Also gave PRN neb. Pt seems more appropriate  now on vent. ABG was collected post changes. Results within normal limits.Will cont. To monitor

## 2021-08-02 NOTE — Progress Notes (Signed)
Trauma/Critical Care Follow Up Note  Subjective:    Overnight Issues:   Objective:  Vital signs for last 24 hours: Temp:  [98.5 F (36.9 C)-99.5 F (37.5 C)] 98.9 F (37.2 C) (11/14 0800) Pulse Rate:  [61-119] 94 (11/14 0800) Resp:  [14-31] 21 (11/14 0800) BP: (95-176)/(61-124) 150/82 (11/14 0800) SpO2:  [94 %-100 %] 98 % (11/14 0800) Arterial Line BP: (112-218)/(55-110) 157/68 (11/14 0800) FiO2 (%):  [40 %] 40 % (11/14 0800) Weight:  [113.2 kg] 113.2 kg (11/14 0500)  Hemodynamic parameters for last 24 hours:    Intake/Output from previous day: 11/13 0701 - 11/14 0700 In: 2534.6 [I.V.:1012.1; NG/GT:1522.5] Out: 4565 [Urine:4265; Emesis/NG output:100; Chest Tube:200]  Intake/Output this shift: Total I/O In: 109.5 [I.V.:39.5; NG/GT:70] Out: 150 [Urine:150]  Vent settings for last 24 hours: Vent Mode: PSV;CPAP FiO2 (%):  [40 %] 40 % Set Rate:  [20 bmp-24 bmp] 20 bmp Vt Set:  [650 mL] 650 mL PEEP:  [5 cmH20] 5 cmH20 Pressure Support:  [3 cmH20-5 cmH20] 5 cmH20 Plateau Pressure:  [15 cmH20-24 cmH20] 24 cmH20  Physical Exam:  Gen: comfortable, no distress Neuro: non-focal exam HEENT: PERRL Neck: supple CV: RRR Pulm: unlabored breathing Abd: soft, NT GU: clear yellow urine Extr: wwp, no edema   Results for orders placed or performed during the hospital encounter of 07/27/21 (from the past 24 hour(s))  Glucose, capillary     Status: Abnormal   Collection Time: 08/01/21 11:56 AM  Result Value Ref Range   Glucose-Capillary 164 (H) 70 - 99 mg/dL  Glucose, capillary     Status: Abnormal   Collection Time: 08/01/21  3:37 PM  Result Value Ref Range   Glucose-Capillary 161 (H) 70 - 99 mg/dL  Glucose, capillary     Status: Abnormal   Collection Time: 08/01/21  7:21 PM  Result Value Ref Range   Glucose-Capillary 155 (H) 70 - 99 mg/dL  Glucose, capillary     Status: Abnormal   Collection Time: 08/01/21 11:08 PM  Result Value Ref Range   Glucose-Capillary 137 (H)  70 - 99 mg/dL  Glucose, capillary     Status: Abnormal   Collection Time: 08/02/21  3:10 AM  Result Value Ref Range   Glucose-Capillary 148 (H) 70 - 99 mg/dL  I-STAT 7, (LYTES, BLD GAS, ICA, H+H)     Status: Abnormal   Collection Time: 08/02/21  3:28 AM  Result Value Ref Range   pH, Arterial 7.398 7.350 - 7.450   pCO2 arterial 40.5 32.0 - 48.0 mmHg   pO2, Arterial 90 83.0 - 108.0 mmHg   Bicarbonate 25.0 20.0 - 28.0 mmol/L   TCO2 26 22 - 32 mmol/L   O2 Saturation 97.0 %   Acid-Base Excess 0.0 0.0 - 2.0 mmol/L   Sodium 139 135 - 145 mmol/L   Potassium 3.6 3.5 - 5.1 mmol/L   Calcium, Ion 1.17 1.15 - 1.40 mmol/L   HCT 21.0 (L) 39.0 - 52.0 %   Hemoglobin 7.1 (L) 13.0 - 17.0 g/dL   Patient temperature 25.4 F    Sample type ARTERIAL   CBC     Status: Abnormal   Collection Time: 08/02/21  5:15 AM  Result Value Ref Range   WBC 6.7 4.0 - 10.5 K/uL   RBC 2.35 (L) 4.22 - 5.81 MIL/uL   Hemoglobin 8.2 (L) 13.0 - 17.0 g/dL   HCT 27.0 (L) 62.3 - 76.2 %   MCV 106.0 (H) 80.0 - 100.0 fL   MCH 34.9 (H)  26.0 - 34.0 pg   MCHC 32.9 30.0 - 36.0 g/dL   RDW 42.5 95.6 - 38.7 %   Platelets 145 (L) 150 - 400 K/uL   nRBC 1.3 (H) 0.0 - 0.2 %  Basic metabolic panel     Status: Abnormal   Collection Time: 08/02/21  5:15 AM  Result Value Ref Range   Sodium 138 135 - 145 mmol/L   Potassium 3.4 (L) 3.5 - 5.1 mmol/L   Chloride 107 98 - 111 mmol/L   CO2 24 22 - 32 mmol/L   Glucose, Bld 158 (H) 70 - 99 mg/dL   BUN 32 (H) 8 - 23 mg/dL   Creatinine, Ser 5.64 0.61 - 1.24 mg/dL   Calcium 7.9 (L) 8.9 - 10.3 mg/dL   GFR, Estimated >33 >29 mL/min   Anion gap 7 5 - 15  Glucose, capillary     Status: Abnormal   Collection Time: 08/02/21  7:14 AM  Result Value Ref Range   Glucose-Capillary 140 (H) 70 - 99 mg/dL    Assessment & Plan: The plan of care was discussed with the bedside nurse for the day, Rayfield Citizen, who is in agreement with this plan and no additional concerns were raised.   Present on Admission:   Fall    LOS: 6 days   Additional comments:I reviewed the patient's new clinical lab test results.   and I reviewed the patients new imaging test results.    Fall off ladder   R PTX - CT placed in ED, CT to WS, o/p 200 R rib fractures - aggressive multimodal pain control, IS, pulm toilet Pelvic fx (L sacrum, R iliac, b/l inf and L sup pubic rami) with pelvic hematoma - ortho c/s, Dr. Carola Frost, SI screw 11/10 VDRF - intubated 11/9, self-extubated and promptly re-intubated 11/12 AM. Repeat lasix. Possible extubation today.  H/o alcohol abuse - EtOH per tube, CIWA, TOC, librium increase today, increase EtOH per tube COPD - prn duonebs HTN - restart home lisinopril Hx of Hep C FEN: NPO, OGT, TF, large BM yest, de-escalate bowel reg, replete hypokalemia, check phos DVT - SCDs, restart LMWH - thrombocytopenia improved, HIT Ab negative Foley - placed for retention 11/10 PICC - placed 11/10 Dispo - ICU   Critical Care Total Time: 45 minutes  Diamantina Monks, MD Trauma & General Surgery Please use [email or web address] to contact on call provider  08/02/2021  *Care during the described time interval was provided by me. I have reviewed this patient's available data, including medical history, events of note, physical examination and test results as part of my evaluation.

## 2021-08-03 ENCOUNTER — Inpatient Hospital Stay (HOSPITAL_COMMUNITY): Payer: BC Managed Care – PPO

## 2021-08-03 LAB — CBC
HCT: 27.7 % — ABNORMAL LOW (ref 39.0–52.0)
Hemoglobin: 8.7 g/dL — ABNORMAL LOW (ref 13.0–17.0)
MCH: 34.1 pg — ABNORMAL HIGH (ref 26.0–34.0)
MCHC: 31.4 g/dL (ref 30.0–36.0)
MCV: 108.6 fL — ABNORMAL HIGH (ref 80.0–100.0)
Platelets: 192 10*3/uL (ref 150–400)
RBC: 2.55 MIL/uL — ABNORMAL LOW (ref 4.22–5.81)
RDW: 13.3 % (ref 11.5–15.5)
WBC: 8.4 10*3/uL (ref 4.0–10.5)
nRBC: 1.4 % — ABNORMAL HIGH (ref 0.0–0.2)

## 2021-08-03 LAB — BASIC METABOLIC PANEL
Anion gap: 8 (ref 5–15)
BUN: 28 mg/dL — ABNORMAL HIGH (ref 8–23)
CO2: 23 mmol/L (ref 22–32)
Calcium: 7.9 mg/dL — ABNORMAL LOW (ref 8.9–10.3)
Chloride: 105 mmol/L (ref 98–111)
Creatinine, Ser: 0.69 mg/dL (ref 0.61–1.24)
GFR, Estimated: >60 mL/min (ref >60)
Glucose, Bld: 147 mg/dL — ABNORMAL HIGH (ref 70–99)
Potassium: 3.9 mmol/L (ref 3.5–5.1)
Sodium: 136 mmol/L (ref 135–145)

## 2021-08-03 LAB — GLUCOSE, CAPILLARY
Glucose-Capillary: 148 mg/dL — ABNORMAL HIGH (ref 70–99)
Glucose-Capillary: 154 mg/dL — ABNORMAL HIGH (ref 70–99)
Glucose-Capillary: 155 mg/dL — ABNORMAL HIGH (ref 70–99)
Glucose-Capillary: 148 mg/dL — ABNORMAL HIGH (ref 70–99)
Glucose-Capillary: 160 mg/dL — ABNORMAL HIGH (ref 70–99)
Glucose-Capillary: 132 mg/dL — ABNORMAL HIGH (ref 70–99)

## 2021-08-03 MED ORDER — POTASSIUM CHLORIDE 20 MEQ PO PACK
40.0000 meq | PACK | Freq: Once | ORAL | Status: AC
  Administered 2021-08-03: 40 meq
  Filled 2021-08-03: qty 2

## 2021-08-03 MED ORDER — INSULIN ASPART 100 UNIT/ML IJ SOLN
0.0000 [IU] | INTRAMUSCULAR | Status: DC
Start: 2021-08-03 — End: 2021-08-30
  Administered 2021-08-03: 16:00:00 2 [IU] via SUBCUTANEOUS
  Administered 2021-08-03 (×3): 3 [IU] via SUBCUTANEOUS
  Administered 2021-08-04: 5 [IU] via SUBCUTANEOUS
  Administered 2021-08-04: 3 [IU] via SUBCUTANEOUS
  Administered 2021-08-04 (×3): 2 [IU] via SUBCUTANEOUS
  Administered 2021-08-04 (×2): 5 [IU] via SUBCUTANEOUS
  Administered 2021-08-05 – 2021-08-06 (×7): 3 [IU] via SUBCUTANEOUS
  Administered 2021-08-06: 20:00:00 2 [IU] via SUBCUTANEOUS
  Administered 2021-08-06 (×3): 3 [IU] via SUBCUTANEOUS
  Administered 2021-08-07 – 2021-08-08 (×7): 2 [IU] via SUBCUTANEOUS
  Administered 2021-08-08: 3 [IU] via SUBCUTANEOUS
  Administered 2021-08-08 – 2021-08-15 (×22): 2 [IU] via SUBCUTANEOUS
  Administered 2021-08-15 (×2): 3 [IU] via SUBCUTANEOUS
  Administered 2021-08-15 – 2021-08-16 (×3): 2 [IU] via SUBCUTANEOUS
  Administered 2021-08-16: 16:00:00 3 [IU] via SUBCUTANEOUS
  Administered 2021-08-16 – 2021-08-17 (×3): 2 [IU] via SUBCUTANEOUS
  Administered 2021-08-18: 3 [IU] via SUBCUTANEOUS
  Administered 2021-08-18 (×3): 2 [IU] via SUBCUTANEOUS
  Administered 2021-08-18: 3 [IU] via SUBCUTANEOUS
  Administered 2021-08-19 (×3): 2 [IU] via SUBCUTANEOUS
  Administered 2021-08-20 (×2): 3 [IU] via SUBCUTANEOUS
  Administered 2021-08-20 – 2021-08-21 (×4): 2 [IU] via SUBCUTANEOUS
  Administered 2021-08-21: 3 [IU] via SUBCUTANEOUS
  Administered 2021-08-21 – 2021-08-24 (×15): 2 [IU] via SUBCUTANEOUS
  Administered 2021-08-24: 3 [IU] via SUBCUTANEOUS
  Administered 2021-08-24 – 2021-08-28 (×10): 2 [IU] via SUBCUTANEOUS
  Administered 2021-08-28: 3 [IU] via SUBCUTANEOUS
  Administered 2021-08-28: 2 [IU] via SUBCUTANEOUS
  Administered 2021-08-29 (×2): 3 [IU] via SUBCUTANEOUS
  Administered 2021-08-29 – 2021-08-30 (×7): 2 [IU] via SUBCUTANEOUS

## 2021-08-03 MED ORDER — PHENYLEPHRINE HCL-NACL 20-0.9 MG/250ML-% IV SOLN
0.0000 ug/min | INTRAVENOUS | Status: DC
  Administered 2021-08-03: 250 ug/min via INTRAVENOUS
  Administered 2021-08-03: 300 ug/min via INTRAVENOUS
  Administered 2021-08-03: 60 ug/min via INTRAVENOUS
  Administered 2021-08-03: 200 ug/min via INTRAVENOUS
  Administered 2021-08-03: 20 ug/min via INTRAVENOUS
  Administered 2021-08-03: 120 ug/min via INTRAVENOUS
  Administered 2021-08-03: 225 ug/min via INTRAVENOUS
  Administered 2021-08-03: 250 ug/min via INTRAVENOUS
  Administered 2021-08-04: 100 ug/min via INTRAVENOUS
  Filled 2021-08-03 (×3): qty 250
  Filled 2021-08-03: qty 500
  Filled 2021-08-03 (×5): qty 250

## 2021-08-03 MED ORDER — BETHANECHOL CHLORIDE 25 MG PO TABS
25.0000 mg | ORAL_TABLET | Freq: Three times a day (TID) | ORAL | Status: DC
  Administered 2021-08-03 – 2021-08-30 (×81): 25 mg
  Filled 2021-08-03 (×83): qty 1

## 2021-08-03 MED ORDER — ALBUMIN HUMAN 25 % IV SOLN
12.5000 g | Freq: Once | INTRAVENOUS | Status: AC
  Administered 2021-08-03: 12.5 g via INTRAVENOUS
  Filled 2021-08-03: qty 50

## 2021-08-03 MED ORDER — CHLORHEXIDINE GLUCONATE CLOTH 2 % EX PADS
6.0000 | MEDICATED_PAD | Freq: Every day | CUTANEOUS | Status: DC
  Administered 2021-08-03 – 2021-08-29 (×25): 6 via TOPICAL

## 2021-08-03 MED ORDER — FUROSEMIDE 10 MG/ML IJ SOLN
60.0000 mg | Freq: Once | INTRAMUSCULAR | Status: AC
  Administered 2021-08-03: 60 mg via INTRAVENOUS
  Filled 2021-08-03: qty 6

## 2021-08-03 MED ORDER — QUETIAPINE FUMARATE 100 MG PO TABS
100.0000 mg | ORAL_TABLET | Freq: Two times a day (BID) | ORAL | Status: DC
  Administered 2021-08-03 – 2021-08-05 (×5): 100 mg
  Filled 2021-08-03 (×6): qty 1

## 2021-08-03 MED ORDER — NOREPINEPHRINE 4 MG/250ML-% IV SOLN
0.0000 ug/min | INTRAVENOUS | Status: DC
  Administered 2021-08-04: 10 ug/min via INTRAVENOUS
  Administered 2021-08-04: 2 ug/min via INTRAVENOUS
  Administered 2021-08-04: 18 ug/min via INTRAVENOUS
  Administered 2021-08-05: 13:00:00 7 ug/min via INTRAVENOUS
  Administered 2021-08-05: 03:00:00 9 ug/min via INTRAVENOUS
  Administered 2021-08-06: 4 ug/min via INTRAVENOUS
  Administered 2021-08-06: 5 ug/min via INTRAVENOUS
  Administered 2021-08-06: 2 ug/min via INTRAVENOUS
  Filled 2021-08-03 (×6): qty 250

## 2021-08-03 NOTE — TOC Progression Note (Signed)
Transition of Care [facility]) - Progression Note    Patient Details  Name: [name] MRN: [id number] Date of Birth: 19-May-1956  [facility]) CM/SW Contact  Glennon Mac, RN Phone Number: 08/03/2021, 11:55 AM  Clinical Narrative:    Received call from [facility] Case Manager, Luz Brazen, requesting clinical information.  I spoke with [name], regarding request, and she is agreeable to having clinical info faxed to [facility].   Faxed H&P and all progress notes to [facility], fax # [phone].    Expected Discharge Plan: IP Rehab Facility Barriers to Discharge: Continued Medical Work up  Expected Discharge Plan and Services Expected Discharge Plan: IP Rehab Facility   Discharge Planning Services: CM Consult   Living arrangements for the past 2 months: Single Family Home                                       Social Determinants of Health (SDOH) Interventions    Readmission Risk Interventions No flowsheet data found.  [name], RN, BSN  Trauma/Neuro ICU Case Manager [phone]

## 2021-08-03 NOTE — Progress Notes (Signed)
Patient ID: [name], male   DOB: 10/27/1955, 65 y.o.   MRN: [id number] Diuresed 1.6L after Lasix but now BP low. Tried neo. Will give 25% albumin and transition to levophed. I updated his daughter at the bedside.  Violeta Gelinas, MD, MPH, FACS Please use [email or web address] to contact on call provider

## 2021-08-03 NOTE — Progress Notes (Signed)
Pt transported from 4N15 to CT and back w/ RN x2, RT x1. No complications pt is resting well. RT will cont to monitor.

## 2021-08-03 NOTE — Progress Notes (Signed)
Patient ID: [name], male   DOB: September 12, 1956, 65 y.o.   MRN: [id number] Follow up - Trauma Critical Care  Patient Details:    [name] is an 65 y.o. male.  Lines/tubes : Airway 7.5 mm (Active)  Secured at (cm) 25 cm 08/03/21 0417  Measured From Lips 08/03/21 0417  Secured Location Center 08/03/21 0417  Secured By [employer] 08/03/21 0417  Tube Holder Repositioned Yes 08/03/21 0417  Prone position No 08/03/21 0417  Cuff Pressure (cm H2O) Green OR 18-26 [facility] 08/02/21 1929  Site Condition Dry 08/03/21 0417     PICC Triple Lumen 07/29/21 PICC Right Basilic 43 cm 0 cm (Active)  Indication for Insertion or Continuance of Line Prolonged intravenous therapies 08/02/21 2000  Exposed Catheter (cm) 0 cm 07/29/21 1635  Site Assessment Clean;Dry;Intact 08/02/21 2000  Lumen #1 Status Infusing 08/02/21 2000  Lumen #2 Status Infusing 08/02/21 2000  Lumen #3 Status Flushed;Blood return noted;Saline locked 08/02/21 2000  Dressing Type Transparent 08/02/21 2000  Dressing Status Clean;Dry;Intact 08/02/21 2000  Antimicrobial disc in place? Yes 08/02/21 2000  Safety Lock Not Applicable 08/02/21 2000  Line Care Lumen 1 tubing changed;Lumen 2 tubing changed;Connections checked and tightened 08/02/21 2000  Dressing Intervention New dressing;Other (Comment) 07/29/21 1635  Dressing Change Due 08/05/21 08/02/21 2000     Arterial Line 07/28/21 Right Radial (Active)  Site Assessment Clean;Dry;Intact 08/02/21 2000  Line Status Pulsatile blood flow 08/02/21 2000  Art Line Waveform Appropriate 08/02/21 2000  Art Line Interventions Zeroed and calibrated;Leveled;Connections checked and tightened 08/02/21 2000  Color/Movement/Sensation Capillary refill less than 3 sec;Cool fingers/toes 08/02/21 2000  Dressing Type Transparent 08/02/21 2000  Dressing Status Clean;Dry;Intact;Antimicrobial disc in place 08/02/21 2000  Interventions New dressing;Dressing changed;Tubing changed 08/02/21  0342  Dressing Change Due 08/06/21 08/02/21 2000     Chest Tube Lateral;Right Pleural (Active)  Status To water seal 08/02/21 2000  Chest Tube Air Leak None 08/02/21 2000  Patency Intervention Tip/tilt 08/02/21 2000  Drainage Description Serosanguineous 08/02/21 2000  Dressing Status Clean;Dry;Intact 08/02/21 2000  Dressing Intervention Dressing reinforced 08/02/21 0800  Site Assessment Clean;Dry;Intact 08/02/21 2000  Surrounding Skin Dry;Intact 08/02/21 2000  Output (mL) 40 mL 08/03/21 0600     NG/OG Vented/Dual Lumen Oral External length of tube (Active)  Tube Position (Required) External length of tube 08/02/21 2000  Measurement (cm) (Required) 43 cm 08/02/21 2000  Ongoing Placement Verification (Required) (See row information) Yes 08/02/21 2000  Site Assessment Clean;Dry;Intact;Tape intact 08/02/21 2000  Status Feeding 08/02/21 2000  Drainage Appearance None 08/02/21 0800  Output (mL) 100 mL 08/01/21 0800     Urethral Catheter Gabriel Carina RN Straight-tip 16 Fr. (Active)  Indication for Insertion or Continuance of Catheter Acute urinary retention (I&O Cath for 24 hrs prior to catheter insertion- Inpatient Only) 08/02/21 2000  Site Assessment Clean;Dry;Intact 08/02/21 2000  Catheter Maintenance Bag below level of bladder;Catheter secured;Drainage bag/tubing not touching floor;Insertion date on drainage bag;No dependent loops;Seal intact 08/02/21 2000  Collection Container Standard drainage bag 08/02/21 2000  Securement Method Securing device (Describe) 08/02/21 2000  Urinary Catheter Interventions (if applicable) Unclamped 08/02/21 0800  Output (mL) 150 mL 08/03/21 0600    Microbiology/Sepsis markers: Results for orders placed or performed during the hospital encounter of 07/27/21  Resp Panel by RT-PCR (Flu A&B, Covid) Nasopharyngeal Swab     Status: None   Collection Time: 07/27/21  3:34 PM   Specimen: Nasopharyngeal Swab; Nasopharyngeal(NP) swabs in vial transport medium  Result  Value  Ref Range Status   SARS Coronavirus 2 by RT PCR NEGATIVE NEGATIVE Final    Comment: (NOTE) SARS-CoV-2 target nucleic acids are NOT DETECTED.  The SARS-CoV-2 RNA is generally detectable in upper respiratory specimens during the acute phase of infection. The lowest concentration of SARS-CoV-2 viral copies this assay can detect is 138 copies/mL. A negative result does not preclude SARS-Cov-2 infection and should not be used as the sole basis for treatment or other patient management decisions. A negative result may occur with  improper specimen collection/handling, submission of specimen other than nasopharyngeal swab, presence of viral mutation(s) within the areas targeted by this assay, and inadequate number of viral copies(<138 copies/mL). A negative result must be combined with clinical observations, patient history, and epidemiological information. The expected result is Negative.  Fact Sheet for Patients:  BloggerCourse.com  Fact Sheet for Healthcare Providers:  SeriousBroker.it  This test is no t yet approved or cleared by the [place] FDA and  has been authorized for detection and/or diagnosis of SARS-CoV-2 by FDA under an Emergency Use Authorization (EUA). This EUA will remain  in effect (meaning this test can be used) for the duration of the COVID-19 declaration under Section 564(b)(1) of the Act, 21 U.S.C.section 360bbb-3(b)(1), unless the authorization is terminated  or revoked sooner.       Influenza A by PCR NEGATIVE NEGATIVE Final   Influenza B by PCR NEGATIVE NEGATIVE Final    Comment: (NOTE) The Xpert Xpress SARS-CoV-2/FLU/RSV plus assay is intended as an aid in the diagnosis of influenza from Nasopharyngeal swab specimens and should not be used as a sole basis for treatment. Nasal washings and aspirates are unacceptable for Xpert Xpress SARS-CoV-2/FLU/RSV testing.  Fact Sheet for  Patients: BloggerCourse.com  Fact Sheet for Healthcare Providers: SeriousBroker.it  This test is not yet approved or cleared by the [place] FDA and has been authorized for detection and/or diagnosis of SARS-CoV-2 by FDA under an Emergency Use Authorization (EUA). This EUA will remain in effect (meaning this test can be used) for the duration of the COVID-19 declaration under Section 564(b)(1) of the Act, 21 U.S.C. section 360bbb-3(b)(1), unless the authorization is terminated or revoked.  Performed at [facility], [address] [place], [place] [address]   Surgical PCR screen     Status: None   Collection Time: 07/27/21  5:54 PM   Specimen: Nasal Mucosa; Nasal Swab  Result Value Ref Range Status   MRSA, PCR NEGATIVE NEGATIVE Final   Staphylococcus aureus NEGATIVE NEGATIVE Final    Comment: (NOTE) The Xpert SA Assay (FDA approved for NASAL specimens in patients 86 years of age and older), is one component of a comprehensive surveillance program. It is not intended to diagnose infection nor to guide or monitor treatment. Performed at [facility], [address] [place], [place] [address]     Anti-infectives:  Anti-infectives (From admission, onward)    Start     Dose/Rate Route Frequency Ordered Stop   07/29/21 1800  ceFAZolin (ANCEF) IVPB 2g/100 mL premix        2 g 200 mL/hr over 30 Minutes Intravenous Every 8 hours 07/29/21 1241 07/30/21 1112   07/28/21 0815  ceFAZolin (ANCEF) IVPB 2g/100 mL premix  Status:  Discontinued        2 g 200 mL/hr over 30 Minutes Intravenous On call to O.R. 07/27/21 1744 07/28/21 0900       Best Practice/Protocols:  VTE Prophylaxis: Lovenox (prophylaxtic dose) Continous Sedation  Consults: Treatment Team:  Myrene Galas, MD    Studies:    Events:  Subjective:    Overnight Issues:   Objective:  Vital signs for last 24 hours: Temp:  [98.9 F (37.2  C)-99.7 F (37.6 C)] 99.7 F (37.6 C) (11/15 0400) Pulse Rate:  [90-113] 113 (11/15 0744) Resp:  [17-33] 33 (11/15 0744) BP: (92-170)/(48-97) 118/70 (11/15 0700) SpO2:  [92 %-99 %] 99 % (11/15 0744) Arterial Line BP: (95-193)/(50-91) 126/57 (11/15 0700) FiO2 (%):  [40 %] 40 % (11/15 0744)  Hemodynamic parameters for last 24 hours:    Intake/Output from previous day: 11/14 0701 - 11/15 0700 In: 2434.4 [I.V.:754.4; NG/GT:1680] Out: 3660 [Urine:3500; Chest Tube:160]  Intake/Output this shift: No intake/output data recorded.  Vent settings for last 24 hours: Vent Mode: PRVC FiO2 (%):  [40 %] 40 % Set Rate:  [20 bmp] 20 bmp Vt Set:  [650 mL] 650 mL PEEP:  [5 cmH20] 5 cmH20 Plateau Pressure:  [13 cmH20-21 cmH20] 13 cmH20  Physical Exam:  General: on vent Neuro: sedated, PERL HEENT/Neck: ETT Resp: some rales R>L CVS: RRR GI: soft, nontender, BS WNL, no r/g Extremities: edema 2+  Results for orders placed or performed during the hospital encounter of 07/27/21 (from the past 24 hour(s))  Phosphorus     Status: None   Collection Time: 08/02/21  9:04 AM  Result Value Ref Range   Phosphorus 2.6 2.5 - 4.6 mg/dL  Glucose, capillary     Status: Abnormal   Collection Time: 08/02/21 11:49 AM  Result Value Ref Range   Glucose-Capillary 142 (H) 70 - 99 mg/dL  Glucose, capillary     Status: Abnormal   Collection Time: 08/02/21  4:15 PM  Result Value Ref Range   Glucose-Capillary 151 (H) 70 - 99 mg/dL  Glucose, capillary     Status: Abnormal   Collection Time: 08/02/21  7:13 PM  Result Value Ref Range   Glucose-Capillary 154 (H) 70 - 99 mg/dL  Glucose, capillary     Status: Abnormal   Collection Time: 08/02/21 11:13 PM  Result Value Ref Range   Glucose-Capillary 155 (H) 70 - 99 mg/dL  Glucose, capillary     Status: Abnormal   Collection Time: 08/03/21  3:32 AM  Result Value Ref Range   Glucose-Capillary 148 (H) 70 - 99 mg/dL  CBC     Status: Abnormal   Collection Time:  08/03/21  5:46 AM  Result Value Ref Range   WBC 8.4 4.0 - 10.5 K/uL   RBC 2.55 (L) 4.22 - 5.81 MIL/uL   Hemoglobin 8.7 (L) 13.0 - 17.0 g/dL   HCT 88.2 (L) 80.0 - 34.9 %   MCV 108.6 (H) 80.0 - 100.0 fL   MCH 34.1 (H) 26.0 - 34.0 pg   MCHC 31.4 30.0 - 36.0 g/dL   RDW 17.9 15.0 - 56.9 %   Platelets 192 150 - 400 K/uL   nRBC 1.4 (H) 0.0 - 0.2 %  Basic metabolic panel     Status: Abnormal   Collection Time: 08/03/21  5:46 AM  Result Value Ref Range   Sodium 136 135 - 145 mmol/L   Potassium 3.9 3.5 - 5.1 mmol/L   Chloride 105 98 - 111 mmol/L   CO2 23 22 - 32 mmol/L   Glucose, Bld 147 (H) 70 - 99 mg/dL   BUN 28 (H) 8 - 23 mg/dL   Creatinine, Ser 7.94 0.61 - 1.24 mg/dL   Calcium 7.9 (L) 8.9 - 10.3 mg/dL  GFR, Estimated >60 >60 mL/min   Anion gap 8 5 - 15  Glucose, capillary     Status: Abnormal   Collection Time: 08/03/21  7:33 AM  Result Value Ref Range   Glucose-Capillary 154 (H) 70 - 99 mg/dL    Assessment & Plan: Present on Admission:  Fall    LOS: 7 days   Additional comments:I reviewed the patient's new clinical lab test results. And CXR Fall off ladder   R PTX - CT placed in ED, CT to WS, o/p 200 R rib fractures - aggressive multimodal pain control, IS, pulm toilet Pelvic fx (L sacrum, R iliac, b/l inf and L sup pubic rami) with pelvic hematoma - ortho c/s, Dr. Carola Frost, SI screw 11/10 VDRF - intubated 11/9, self-extubated and promptly re-intubated 11/12 AM. Repeat lasix. Wean, add seroquel H/o alcohol abuse - EtOH per tube, CIWA, TOC, librium increase today, increase EtOH per tube COPD - prn duonebs HTN - home lisinopril Hx of Hep C FEN: NPO, OGT, TF, large BM yest, de-escalate bowel reg, replete hypokalemia, check phos DVT - SCDs, restart LMWH - thrombocytopenia improved, HIT Ab negative Foley - placed for retention 11/10. TOV after lasix, add urecholine PICC - placed 11/10 Dispo - ICU, D/C a line I spoke with his daughter at the bedside Critical Care Total  Time*: 31 Minutes  Violeta Gelinas, MD, MPH, FACS Trauma & General Surgery Use [email or web address] to contact on call provider  08/03/2021  *Care during the described time interval was provided by me. I have reviewed this patient's available data, including medical history, events of note, physical examination and test results as part of my evaluation.

## 2021-08-03 NOTE — Progress Notes (Signed)
1000- BP 58/38(46) stopped fentanyl and propofol contacted Dr. [name].  VO for neo gtt.  1030- pt became diaphoretic, tachypnic, and tachycardic. Switched to full support on vent.  1045- BP began decreasing. Dr. [name] on unit, provided order for 25% albumin and levo gtt.  Patient currently hemodynamically stable, will continue to monitor.

## 2021-08-04 LAB — BASIC METABOLIC PANEL
Anion gap: 7 (ref 5–15)
BUN: 27 mg/dL — ABNORMAL HIGH (ref 8–23)
CO2: 24 mmol/L (ref 22–32)
Calcium: 8 mg/dL — ABNORMAL LOW (ref 8.9–10.3)
Chloride: 109 mmol/L (ref 98–111)
Creatinine, Ser: 0.66 mg/dL (ref 0.61–1.24)
GFR, Estimated: >60 mL/min (ref >60)
Glucose, Bld: 123 mg/dL — ABNORMAL HIGH (ref 70–99)
Potassium: 4.3 mmol/L (ref 3.5–5.1)
Sodium: 140 mmol/L (ref 135–145)

## 2021-08-04 LAB — CBC
HCT: 26 % — ABNORMAL LOW (ref 39.0–52.0)
Hemoglobin: 7.9 g/dL — ABNORMAL LOW (ref 13.0–17.0)
MCH: 33.6 pg (ref 26.0–34.0)
MCHC: 30.4 g/dL (ref 30.0–36.0)
MCV: 110.6 fL — ABNORMAL HIGH (ref 80.0–100.0)
Platelets: 218 10*3/uL (ref 150–400)
RBC: 2.35 MIL/uL — ABNORMAL LOW (ref 4.22–5.81)
RDW: 13.6 % (ref 11.5–15.5)
WBC: 10.3 10*3/uL (ref 4.0–10.5)
nRBC: 0.8 % — ABNORMAL HIGH (ref 0.0–0.2)

## 2021-08-04 LAB — GLUCOSE, CAPILLARY
Glucose-Capillary: 133 mg/dL — ABNORMAL HIGH (ref 70–99)
Glucose-Capillary: 136 mg/dL — ABNORMAL HIGH (ref 70–99)
Glucose-Capillary: 202 mg/dL — ABNORMAL HIGH (ref 70–99)
Glucose-Capillary: 244 mg/dL — ABNORMAL HIGH (ref 70–99)
Glucose-Capillary: 221 mg/dL — ABNORMAL HIGH (ref 70–99)
Glucose-Capillary: 186 mg/dL — ABNORMAL HIGH (ref 70–99)

## 2021-08-04 LAB — AMMONIA: Ammonia: 38 umol/L — ABNORMAL HIGH (ref 9–35)

## 2021-08-04 LAB — TRIGLYCERIDES: Triglycerides: 179 mg/dL — ABNORMAL HIGH (ref <150)

## 2021-08-04 LAB — T4, FREE: Free T4: 0.89 ng/dL (ref 0.61–1.12)

## 2021-08-04 LAB — TSH: TSH: 10.767 u[IU]/mL — ABNORMAL HIGH (ref 0.350–4.500)

## 2021-08-04 LAB — PHOSPHORUS: Phosphorus: 3.6 mg/dL (ref 2.5–4.6)

## 2021-08-04 MED ORDER — DEXMEDETOMIDINE HCL IN NACL 400 MCG/100ML IV SOLN
0.4000 ug/kg/h | INTRAVENOUS | Status: DC
  Administered 2021-08-04: 0.6 ug/kg/h via INTRAVENOUS
  Administered 2021-08-04: 10:00:00 0.4 ug/kg/h via INTRAVENOUS
  Administered 2021-08-04: 13:00:00 1 ug/kg/h via INTRAVENOUS
  Administered 2021-08-05 (×3): 0.7 ug/kg/h via INTRAVENOUS
  Administered 2021-08-05 (×2): 0.8 ug/kg/h via INTRAVENOUS
  Administered 2021-08-06: 0.7 ug/kg/h via INTRAVENOUS
  Administered 2021-08-06: 11:00:00 0.8 ug/kg/h via INTRAVENOUS
  Administered 2021-08-06: 0.7 ug/kg/h via INTRAVENOUS
  Administered 2021-08-06: 0.4 ug/kg/h via INTRAVENOUS
  Administered 2021-08-07: 0.7 ug/kg/h via INTRAVENOUS
  Administered 2021-08-07: 0.5 ug/kg/h via INTRAVENOUS
  Administered 2021-08-07: 0.6 ug/kg/h via INTRAVENOUS
  Administered 2021-08-07: 0.5 ug/kg/h via INTRAVENOUS
  Administered 2021-08-08: 05:00:00 0.6 ug/kg/h via INTRAVENOUS
  Administered 2021-08-08 (×2): 0.5 ug/kg/h via INTRAVENOUS
  Administered 2021-08-09: 22:00:00 1 ug/kg/h via INTRAVENOUS
  Administered 2021-08-09: 07:00:00 0.5 ug/kg/h via INTRAVENOUS
  Administered 2021-08-09: 1.2 ug/kg/h via INTRAVENOUS
  Administered 2021-08-09: 1 ug/kg/h via INTRAVENOUS
  Administered 2021-08-09: 14:00:00 1.2 ug/kg/h via INTRAVENOUS
  Administered 2021-08-09: 02:00:00 0.9 ug/kg/h via INTRAVENOUS
  Administered 2021-08-10 (×7): 1 ug/kg/h via INTRAVENOUS
  Administered 2021-08-11: 0.9 ug/kg/h via INTRAVENOUS
  Administered 2021-08-11: 0.5 ug/kg/h via INTRAVENOUS
  Administered 2021-08-11: 0.6 ug/kg/h via INTRAVENOUS
  Administered 2021-08-11 (×2): 1.1 ug/kg/h via INTRAVENOUS
  Administered 2021-08-12: 0.9 ug/kg/h via INTRAVENOUS
  Administered 2021-08-12 (×2): 1 ug/kg/h via INTRAVENOUS
  Administered 2021-08-12: 0.7 ug/kg/h via INTRAVENOUS
  Administered 2021-08-12: 0.6 ug/kg/h via INTRAVENOUS
  Administered 2021-08-13 (×3): 1 ug/kg/h via INTRAVENOUS
  Administered 2021-08-13: 0.9 ug/kg/h via INTRAVENOUS
  Administered 2021-08-13 – 2021-08-14 (×2): 1 ug/kg/h via INTRAVENOUS
  Administered 2021-08-14: 1.2 ug/kg/h via INTRAVENOUS
  Administered 2021-08-14: 1 ug/kg/h via INTRAVENOUS
  Administered 2021-08-14: 22:00:00 1.1 ug/kg/h via INTRAVENOUS
  Administered 2021-08-14 (×2): 1.2 ug/kg/h via INTRAVENOUS
  Administered 2021-08-14: 1 ug/kg/h via INTRAVENOUS
  Administered 2021-08-15 (×4): 1.1 ug/kg/h via INTRAVENOUS
  Administered 2021-08-15: 13:00:00 1.2 ug/kg/h via INTRAVENOUS
  Administered 2021-08-15: 21:00:00 0.9 ug/kg/h via INTRAVENOUS
  Administered 2021-08-15: 10:00:00 1.2 ug/kg/h via INTRAVENOUS
  Administered 2021-08-15: 14:00:00 0.9 ug/kg/h via INTRAVENOUS
  Administered 2021-08-15: 17:00:00 1 ug/kg/h via INTRAVENOUS
  Administered 2021-08-16 – 2021-08-17 (×8): 0.9 ug/kg/h via INTRAVENOUS
  Administered 2021-08-17: 0.8 ug/kg/h via INTRAVENOUS
  Administered 2021-08-17 – 2021-08-18 (×3): 0.5 ug/kg/h via INTRAVENOUS
  Administered 2021-08-18: 0.6 ug/kg/h via INTRAVENOUS
  Administered 2021-08-18: 1.2 ug/kg/h via INTRAVENOUS
  Administered 2021-08-18: 0.9 ug/kg/h via INTRAVENOUS
  Administered 2021-08-19: 0.6 ug/kg/h via INTRAVENOUS
  Administered 2021-08-20: 0.8 ug/kg/h via INTRAVENOUS
  Administered 2021-08-20: 0.4 ug/kg/h via INTRAVENOUS
  Filled 2021-08-04 (×49): qty 100
  Filled 2021-08-04: qty 200
  Filled 2021-08-04 (×28): qty 100

## 2021-08-04 MED ORDER — VITAMIN D 25 MCG (1000 UNIT) PO TABS: 1000.0000 [IU] | ORAL_TABLET | Freq: Every day | ORAL | Status: DC

## 2021-08-04 MED ORDER — VITAMIN D 25 MCG (1000 UNIT) PO TABS
2000.0000 [IU] | ORAL_TABLET | Freq: Every day | ORAL | Status: DC
  Administered 2021-08-04 – 2021-08-30 (×26): 2000 [IU]
  Filled 2021-08-04 (×26): qty 2

## 2021-08-04 MED ORDER — ACETAMINOPHEN 500 MG PO TABS
1000.0000 mg | ORAL_TABLET | Freq: Four times a day (QID) | ORAL | Status: DC
  Administered 2021-08-04 – 2021-08-30 (×92): 1000 mg
  Filled 2021-08-04 (×93): qty 2

## 2021-08-04 NOTE — Progress Notes (Addendum)
Trauma/Critical Care Follow Up Note  Subjective:    Overnight Issues:   Objective:  Vital signs for last 24 hours: Temp:  [99 F (37.2 C)-100.2 F (37.9 C)] 100.1 F (37.8 C) (11/16 0800) Pulse Rate:  [59-120] 107 (11/16 1100) Resp:  [16-44] 29 (11/16 1100) BP: (85-183)/(54-99) 90/60 (11/16 1100) SpO2:  [92 %-100 %] 93 % (11/16 1100) Arterial Line BP: (66-185)/(37-78) 98/47 (11/16 1100) FiO2 (%):  [40 %] 40 % (11/16 0828)  Hemodynamic parameters for last 24 hours:    Intake/Output from previous day: 11/15 0701 - 11/16 0700 In: 4921.8 [I.V.:2994.7; NG/GT:1927.2] Out: 4040 [Urine:3870; Chest Tube:170]  Intake/Output this shift: Total I/O In: 366.9 [I.V.:156.9; NG/GT:210] Out: 650 [Urine:650]  Vent settings for last 24 hours: Vent Mode: PRVC FiO2 (%):  [40 %] 40 % Set Rate:  [20 bmp] 20 bmp Vt Set:  [650 mL] 650 mL PEEP:  [5 cmH20] 5 cmH20 Plateau Pressure:  [10 cmH20-25 cmH20] 16 cmH20  Physical Exam:  Gen: comfortable, no distress Neuro: non-focal exam, f/c HEENT: PERRL Neck: supple CV: RRR Pulm: unlabored breathing Abd: soft, NT GU: clear yellow urine, foley Extr: wwp, no edema   Results for orders placed or performed during the hospital encounter of 07/27/21 (from the past 24 hour(s))  Glucose, capillary     Status: Abnormal   Collection Time: 08/03/21 11:47 AM  Result Value Ref Range   Glucose-Capillary 155 (H) 70 - 99 mg/dL  Glucose, capillary     Status: Abnormal   Collection Time: 08/03/21  3:29 PM  Result Value Ref Range   Glucose-Capillary 148 (H) 70 - 99 mg/dL  Glucose, capillary     Status: Abnormal   Collection Time: 08/03/21  7:21 PM  Result Value Ref Range   Glucose-Capillary 160 (H) 70 - 99 mg/dL  Glucose, capillary     Status: Abnormal   Collection Time: 08/03/21 11:11 PM  Result Value Ref Range   Glucose-Capillary 132 (H) 70 - 99 mg/dL  Glucose, capillary     Status: Abnormal   Collection Time: 08/04/21  3:05 AM  Result Value  Ref Range   Glucose-Capillary 133 (H) 70 - 99 mg/dL  Triglycerides     Status: Abnormal   Collection Time: 08/04/21  4:42 AM  Result Value Ref Range   Triglycerides 179 (H) <150 mg/dL  CBC     Status: Abnormal   Collection Time: 08/04/21  4:42 AM  Result Value Ref Range   WBC 10.3 4.0 - 10.5 K/uL   RBC 2.35 (L) 4.22 - 5.81 MIL/uL   Hemoglobin 7.9 (L) 13.0 - 17.0 g/dL   HCT 76.2 (L) 83.1 - 51.7 %   MCV 110.6 (H) 80.0 - 100.0 fL   MCH 33.6 26.0 - 34.0 pg   MCHC 30.4 30.0 - 36.0 g/dL   RDW 61.6 07.3 - 71.0 %   Platelets 218 150 - 400 K/uL   nRBC 0.8 (H) 0.0 - 0.2 %  Basic metabolic panel     Status: Abnormal   Collection Time: 08/04/21  4:42 AM  Result Value Ref Range   Sodium 140 135 - 145 mmol/L   Potassium 4.3 3.5 - 5.1 mmol/L   Chloride 109 98 - 111 mmol/L   CO2 24 22 - 32 mmol/L   Glucose, Bld 123 (H) 70 - 99 mg/dL   BUN 27 (H) 8 - 23 mg/dL   Creatinine, Ser 6.26 0.61 - 1.24 mg/dL   Calcium 8.0 (L) 8.9 - 10.3 mg/dL  GFR, Estimated >60 >60 mL/min   Anion gap 7 5 - 15  TSH     Status: Abnormal   Collection Time: 08/04/21  4:42 AM  Result Value Ref Range   TSH 10.767 (H) 0.350 - 4.500 uIU/mL  T4, free     Status: None   Collection Time: 08/04/21  4:42 AM  Result Value Ref Range   Free T4 0.89 0.61 - 1.12 ng/dL  Glucose, capillary     Status: Abnormal   Collection Time: 08/04/21  7:56 AM  Result Value Ref Range   Glucose-Capillary 136 (H) 70 - 99 mg/dL  Ammonia     Status: Abnormal   Collection Time: 08/04/21  9:16 AM  Result Value Ref Range   Ammonia 38 (H) 9 - 35 umol/L    Assessment & Plan: The plan of care was discussed with the bedside nurse for the day, who is in agreement with this plan and no additional concerns were raised.   Present on Admission:  Fall    LOS: 8 days   Additional comments:I reviewed the patient's new clinical lab test results.   and I reviewed the patients new imaging test results.    Fall off ladder   R PTX - CT placed in ED,  CT to WS, o/p 170 R rib fractures - aggressive multimodal pain control, IS, pulm toilet Pelvic fx (L sacrum, R iliac, b/l inf and L sup pubic rami) with pelvic hematoma - ortho c/s, Dr. Carola Frost, SI screw 11/10 VDRF - intubated 11/9, self-extubated and promptly re-intubated 11/12 AM. PSV as tol. Resp cx sent today. H/o alcohol abuse - EtOH per tube, CIWA, TOC, librium, EtOH per tube COPD - prn duonebs HTN - home lisinopril Hx of Hep C FEN: NPO, OGT, TF, +BM yest, de-escalate bowel reg, check phos DVT - SCDs, LMWH Foley - placed for retention 11/10. Removed 11/16 AM for TOV, on urecholine PICC - placed 11/10 Dispo - ICU  Critical care time:  Diamantina Monks, MD Trauma & General Surgery Please use [email or web address] to contact on call provider  08/04/2021  *Care during the described time interval was provided by me. I have reviewed this patient's available data, including medical history, events of note, physical examination and test results as part of my evaluation.

## 2021-08-04 NOTE — Progress Notes (Signed)
08/04/21 0828  Airway 7.5 mm  Removal Time: (c)   Placement Date/Time: 07/31/21 0715   Placed By: CRNA  Laryngoscope Blade: MAC;3  ETT Types: Oral  Size (mm): 7.5 mm  Cuffed: Cuffed  Insertion attempts: Other (Comment)  Airway Equipment: Stylet;Video Laryngoscope  Placement Confir...  Secured at (cm) 24 cm  Measured From Lips  Secured By [employer] Tube Holder  Tube Holder Repositioned Yes  Prone position No  Cuff Pressure (cm H2O) Green OR 18-26 CmH2O  Site Condition Dry  Adult Ventilator Settings  Vent Type Servo i  Humidity HME  Vent Mode PRVC  Vt Set 650 mL  Set Rate 20 bmp  FiO2 (%) 40 %  I Time 0.8 Sec(s)  PEEP 5 cmH20  Adult Ventilator Measurements  Peak Airway Pressure 25 L/min  Mean Airway Pressure 13 cmH20  Plateau Pressure  (unable)  Resp Rate Spontaneous 12 br/min  Resp Rate Total 32 br/min  Exhaled Vt 705 mL  I:E Ratio Measured 1:1.4  Auto PEEP 0 cmH20  Total PEEP 5 cmH20  SpO2 97 %  Adult Ventilator Alarms  Alarms On Y  Ve High Alarm 25 L/min  Ve Low Alarm 5 L/min  Resp Rate High Alarm 42 br/min  Resp Rate Low Alarm 10  PEEP Low Alarm 3 cmH2O  Press High Alarm 40 cmH2O  VAP Prevention  HME changed No  Ventilator changed No  Transported while on vent No  HOB> 30 Degrees Y  Equipment wiped down Yes  Daily Weaning Assessment  Daily Assessment of Readiness to Wean Wean protocol criteria met (SBT performed)  SBT Method CPAP 5 cm H20 and PS 5 cm H20  Weaning Start Time 0742  Patient response Failed SBT terminated  Reason SBT Terminated RR > 35 breaths/min or Frequency/TV ratio >105  Breath Sounds  Bilateral Breath Sounds Clear;Diminished  Vent Respiratory Assessment  Level of Consciousness Unresponsive  Respiratory Pattern Regular;Unlabored  Patient Tolerance Tolerated well  Airway Suctioning/Secretions  Suction Type ETT  Suction Device  Catheter  Secretion Amount Moderate  Secretion Color Tan;White  Secretion Consistency Thick  Suction  Tolerance Tolerated fairly well  Suctioning Adverse Effects None   

## 2021-08-05 ENCOUNTER — Inpatient Hospital Stay (HOSPITAL_COMMUNITY): Payer: BC Managed Care – PPO

## 2021-08-05 LAB — BASIC METABOLIC PANEL
Anion gap: 5 (ref 5–15)
BUN: 27 mg/dL — ABNORMAL HIGH (ref 8–23)
CO2: 24 mmol/L (ref 22–32)
Calcium: 8.1 mg/dL — ABNORMAL LOW (ref 8.9–10.3)
Chloride: 109 mmol/L (ref 98–111)
Creatinine, Ser: 0.67 mg/dL (ref 0.61–1.24)
GFR, Estimated: >60 mL/min (ref >60)
Glucose, Bld: 222 mg/dL — ABNORMAL HIGH (ref 70–99)
Potassium: 4.5 mmol/L (ref 3.5–5.1)
Sodium: 138 mmol/L (ref 135–145)

## 2021-08-05 LAB — CBC
HCT: 26.4 % — ABNORMAL LOW (ref 39.0–52.0)
Hemoglobin: 8.5 g/dL — ABNORMAL LOW (ref 13.0–17.0)
MCH: 34.8 pg — ABNORMAL HIGH (ref 26.0–34.0)
MCHC: 32.2 g/dL (ref 30.0–36.0)
MCV: 108.2 fL — ABNORMAL HIGH (ref 80.0–100.0)
Platelets: 187 10*3/uL (ref 150–400)
RBC: 2.44 MIL/uL — ABNORMAL LOW (ref 4.22–5.81)
RDW: 13.1 % (ref 11.5–15.5)
WBC: 6.4 10*3/uL (ref 4.0–10.5)
nRBC: 0.3 % — ABNORMAL HIGH (ref 0.0–0.2)

## 2021-08-05 LAB — GLUCOSE, CAPILLARY
Glucose-Capillary: 190 mg/dL — ABNORMAL HIGH (ref 70–99)
Glucose-Capillary: 197 mg/dL — ABNORMAL HIGH (ref 70–99)
Glucose-Capillary: 199 mg/dL — ABNORMAL HIGH (ref 70–99)
Glucose-Capillary: 194 mg/dL — ABNORMAL HIGH (ref 70–99)
Glucose-Capillary: 183 mg/dL — ABNORMAL HIGH (ref 70–99)
Glucose-Capillary: 158 mg/dL — ABNORMAL HIGH (ref 70–99)

## 2021-08-05 LAB — CULTURE, RESPIRATORY W GRAM STAIN

## 2021-08-05 MED ORDER — SPIRITUS FRUMENTI
1.0000 | Freq: Two times a day (BID) | ORAL | Status: DC
Start: 2021-08-05 — End: 2021-08-13
  Administered 2021-08-05 – 2021-08-13 (×15): 1
  Filled 2021-08-05 (×16): qty 1

## 2021-08-05 MED ORDER — SILODOSIN 8 MG PO CAPS
8.0000 mg | ORAL_CAPSULE | Freq: Every day | ORAL | Status: DC
  Administered 2021-08-05 – 2021-08-30 (×26): 8 mg
  Filled 2021-08-05 (×29): qty 1

## 2021-08-05 MED ORDER — SILODOSIN 8 MG PO CAPS
8.0000 mg | ORAL_CAPSULE | Freq: Every day | ORAL | Status: DC
  Filled 2021-08-05: qty 1

## 2021-08-05 MED ORDER — INSULIN GLARGINE-YFGN 100 UNIT/ML ~~LOC~~ SOLN
5.0000 [IU] | Freq: Every day | SUBCUTANEOUS | Status: DC
  Administered 2021-08-05 – 2021-08-30 (×26): 5 [IU] via SUBCUTANEOUS
  Filled 2021-08-05 (×27): qty 0.05

## 2021-08-05 NOTE — Progress Notes (Addendum)
Nutrition Follow-up  DOCUMENTATION CODES:   Not applicable  INTERVENTION:   Tube feeding via OG tube:  Pivot 1.5 at 70 ml/h (1680 ml per day)  Provides 2520 kcal, 157 gm protein, 1275 ml free water daily   NUTRITION DIAGNOSIS:   Increased nutrient needs related to post-op healing as evidenced by estimated needs. Ongoing.   GOAL:   Patient will meet greater than or equal to 90% of their needs Met.   MONITOR:   TF tolerance  REASON FOR ASSESSMENT:   Consult, Ventilator Enteral/tube feeding initiation and management  ASSESSMENT:   Pt with PMH of ETOH abuse, COPD, HTN, GERD, and hepatitis C admitted after fall from ladder with R PTX s/p CT, R rib fxs, and pelvic fx s/p fixation.    11/10 s/p screw fixation of pelvic ring  11/12 self-extubated and re-intubated   Patient is currently intubated on ventilator support Temp (24hrs), Avg:99.6 F (37.6 C), Min:99.3 F (37.4 C), Max:100 F (37.8 C)  Medications reviewed and include: vitamin D3, colace, folic acid, SSI, Semglee, MVI with minerals, miralax, vodka BID, thiamine  Precedex  Fentanyl  Levophed @ 7 mcg   Labs reviewed: CBG's: 190-197  CT: 0 ml  - removed this am OG tip in the stomach   Diet Order:   Diet Order             Diet NPO time specified  Diet effective now                   EDUCATION NEEDS:   Not appropriate for education at this time  Skin:  Skin Assessment:  (R hip incision)  Last BM:  11/16  Height:   Ht Readings from Last 1 Encounters:  07/27/21 '6\' 2"'  (1.88 m)    Weight:   Wt Readings from Last 1 Encounters:  08/02/21 113.2 kg    BMI:  Body mass index is 32.04 kg/m.  Estimated Nutritional Needs:   Kcal:  2400-2600  Protein:  135-145 grams  Fluid:  > 2 L/day  Lockie Pares., RD, LDN, CNSC See AMiON for contact information

## 2021-08-05 NOTE — Progress Notes (Signed)
Patient ID: [name], male   DOB: 07/06/1956, 65 y.o.   MRN: [id number] Follow up - Trauma Critical Care  Patient Details:    [name] is an 65 y.o. male.  Lines/tubes : Airway 7.5 mm (Active)  Secured at (cm) 24 cm 08/05/21 0346  Measured From Lips 08/05/21 0346  Secured Location Right 08/05/21 0346  Secured By [employer] 08/05/21 0346  Tube Holder Repositioned Yes 08/05/21 0346  Prone position No 08/05/21 0346  Cuff Pressure (cm H2O) Green OR 18-26 [facility] 08/04/21 1934  Site Condition Dry 08/05/21 0346     PICC Triple Lumen 07/29/21 PICC Right Basilic 43 cm 0 cm (Active)  Indication for Insertion or Continuance of Line Prolonged intravenous therapies 08/04/21 2000  Exposed Catheter (cm) 0 cm 07/29/21 1635  Site Assessment Clean;Dry;Intact 08/04/21 2000  Lumen #1 Status Infusing 08/04/21 2000  Lumen #2 Status Infusing 08/04/21 2000  Lumen #3 Status Flushed 08/04/21 2000  Dressing Type Transparent 08/04/21 2000  Dressing Status Clean;Dry;Intact 08/04/21 2000  Antimicrobial disc in place? Yes 08/04/21 2000  Safety Lock Not Applicable 08/04/21 0700  Line Care Connections checked and tightened 08/04/21 2000  Dressing Intervention New dressing;Other (Comment) 07/29/21 1635  Dressing Change Due 08/05/21 08/04/21 2000     Arterial Line 07/28/21 Right Radial (Active)  Site Assessment Clean;Dry;Intact 08/04/21 2000  Line Status Pulsatile blood flow 08/04/21 2000  Art Line Waveform Appropriate 08/04/21 2000  Art Line Interventions Zeroed and calibrated 08/04/21 2000  Color/Movement/Sensation Capillary refill less than 3 sec 08/04/21 2000  Dressing Type Transparent 08/04/21 2000  Dressing Status Clean;Dry;Intact 08/04/21 2000  Interventions New dressing;Dressing changed;Tubing changed 08/02/21 0342  Dressing Change Due 08/06/21 08/04/21 2000     Chest Tube Lateral;Right Pleural (Active)  Status To water seal 08/04/21 2000  Chest Tube Air Leak None  08/04/21 2000  Patency Intervention Tip/tilt 08/04/21 2000  Drainage Description Serosanguineous 08/04/21 2000  Dressing Status Clean;Dry;Intact 08/04/21 2000  Dressing Intervention Dressing reinforced 08/03/21 2000  Site Assessment Clean;Dry;Intact 08/04/21 2000  Surrounding Skin Dry;Intact 08/04/21 0800  Output (mL) 50 mL 08/05/21 0600     NG/OG Vented/Dual Lumen Oral External length of tube (Active)  Tube Position (Required) External length of tube 08/04/21 2000  Measurement (cm) (Required) 43 cm 08/04/21 2000  Ongoing Placement Verification (Required) (See row information) Yes 08/04/21 2000  Site Assessment Clean;Dry;Intact 08/04/21 2000  Interventions Cleansed 08/04/21 0800  Status Feeding 08/04/21 2000  Drainage Appearance None 08/04/21 2000  Intake (mL) 400 mL 08/03/21 0917  Output (mL) 100 mL 08/01/21 0800     Flatus Tube/Pouch (Active)  Output (mL) 100 mL 08/05/21 0600     External Urinary Catheter (Active)  Intervention Male External Urinary Catheter Replaced 08/04/21 2000    Microbiology/Sepsis markers: Results for orders placed or performed during the hospital encounter of 07/27/21  Resp Panel by RT-PCR (Flu A&B, Covid) Nasopharyngeal Swab     Status: None   Collection Time: 07/27/21  3:34 PM   Specimen: Nasopharyngeal Swab; Nasopharyngeal(NP) swabs in vial transport medium  Result Value Ref Range Status   SARS Coronavirus 2 by RT PCR NEGATIVE NEGATIVE Final    Comment: (NOTE) SARS-CoV-2 target nucleic acids are NOT DETECTED.  The SARS-CoV-2 RNA is generally detectable in upper respiratory specimens during the acute phase of infection. The lowest concentration of SARS-CoV-2 viral copies this assay can detect is 138 copies/mL. A negative result does not preclude SARS-Cov-2 infection and should not be used as the  sole basis for treatment or other patient management decisions. A negative result may occur with  improper specimen collection/handling, submission of  specimen other than nasopharyngeal swab, presence of viral mutation(s) within the areas targeted by this assay, and inadequate number of viral copies(<138 copies/mL). A negative result must be combined with clinical observations, patient history, and epidemiological information. The expected result is Negative.  Fact Sheet for Patients:  BloggerCourse.com  Fact Sheet for Healthcare Providers:  SeriousBroker.it  This test is no t yet approved or cleared by the [place] FDA and  has been authorized for detection and/or diagnosis of SARS-CoV-2 by FDA under an Emergency Use Authorization (EUA). This EUA will remain  in effect (meaning this test can be used) for the duration of the COVID-19 declaration under Section 564(b)(1) of the Act, 21 U.S.C.section 360bbb-3(b)(1), unless the authorization is terminated  or revoked sooner.       Influenza A by PCR NEGATIVE NEGATIVE Final   Influenza B by PCR NEGATIVE NEGATIVE Final    Comment: (NOTE) The Xpert Xpress SARS-CoV-2/FLU/RSV plus assay is intended as an aid in the diagnosis of influenza from Nasopharyngeal swab specimens and should not be used as a sole basis for treatment. Nasal washings and aspirates are unacceptable for Xpert Xpress SARS-CoV-2/FLU/RSV testing.  Fact Sheet for Patients: BloggerCourse.com  Fact Sheet for Healthcare Providers: SeriousBroker.it  This test is not yet approved or cleared by the [place] FDA and has been authorized for detection and/or diagnosis of SARS-CoV-2 by FDA under an Emergency Use Authorization (EUA). This EUA will remain in effect (meaning this test can be used) for the duration of the COVID-19 declaration under Section 564(b)(1) of the Act, 21 U.S.C. section 360bbb-3(b)(1), unless the authorization is terminated or revoked.  Performed at [facility], [address],  [place], [place] [address]   Surgical PCR screen     Status: None   Collection Time: 07/27/21  5:54 PM   Specimen: Nasal Mucosa; Nasal Swab  Result Value Ref Range Status   MRSA, PCR NEGATIVE NEGATIVE Final   Staphylococcus aureus NEGATIVE NEGATIVE Final    Comment: (NOTE) The Xpert SA Assay (FDA approved for NASAL specimens in patients 35 years of age and older), is one component of a comprehensive surveillance program. It is not intended to diagnose infection nor to guide or monitor treatment. Performed at [facility], [address], [place], [place] [address]   Culture, Respiratory w Gram Stain     Status: None (Preliminary result)   Collection Time: 08/04/21 11:53 AM   Specimen: Tracheal Aspirate; Respiratory  Result Value Ref Range Status   Specimen Description TRACHEAL ASPIRATE  Final   Special Requests NONE  Final   Gram Stain   Final    FEW SQUAMOUS EPITHELIAL CELLS PRESENT FEW WBC SEEN FEW GRAM POSITIVE COCCI MODERATE GRAM NEGATIVE RODS Performed at [facility], [address], [place], [place] [address]    Culture PENDING  Incomplete   Report Status PENDING  Incomplete    Anti-infectives:  Anti-infectives (From admission, onward)    Start     Dose/Rate Route Frequency Ordered Stop   07/29/21 1800  ceFAZolin (ANCEF) IVPB 2g/100 mL premix        2 g 200 mL/hr over 30 Minutes Intravenous Every 8 hours 07/29/21 1241 07/30/21 1112   07/28/21 0815  ceFAZolin (ANCEF) IVPB 2g/100 mL premix  Status:  Discontinued        2 g 200  mL/hr over 30 Minutes Intravenous On call to O.R. 07/27/21 1744 07/28/21 0900       Best Practice/Protocols:  VTE Prophylaxis: Lovenox (prophylaxtic dose) Continous Sedation  Consults: Treatment Team:  Myrene Galas, MD    Studies:    Events:  Subjective:    Overnight Issues:   Objective:  Vital signs for last 24 hours: Temp:  [99.3 F (37.4 C)-100 F (37.8 C)] 99.7 F (37.6 C) (11/17 0400) Pulse Rate:   [64-120] 72 (11/17 0800) Resp:  [19-44] 19 (11/17 0800) BP: (84-167)/(52-88) 125/77 (11/17 0800) SpO2:  [93 %-100 %] 99 % (11/17 0800) Arterial Line BP: (66-181)/(37-75) 133/62 (11/17 0800) FiO2 (%):  [40 %] 40 % (11/17 0346)  Hemodynamic parameters for last 24 hours:    Intake/Output from previous day: 11/16 0701 - 11/17 0700 In: 3277.6 [I.V.:1737.6; NG/GT:1540] Out: 2950 [Urine:2800; Stool:100; Chest Tube:50]  Intake/Output this shift: No intake/output data recorded.  Vent settings for last 24 hours: Vent Mode: PRVC FiO2 (%):  [40 %] 40 % Set Rate:  [20 bmp] 20 bmp Vt Set:  [650 mL] 650 mL PEEP:  [5 cmH20] 5 cmH20 Plateau Pressure:  [10 cmH20] 10 cmH20  Physical Exam:  General: on vent Neuro: does arouse and F?C HEENT/Neck: ETT Resp: clear to auscultation bilaterally CVS: RRR GI: soft, NT, +BS Extremities: edema 2+  Results for orders placed or performed during the hospital encounter of 07/27/21 (from the past 24 hour(s))  Ammonia     Status: Abnormal   Collection Time: 08/04/21  9:16 AM  Result Value Ref Range   Ammonia 38 (H) 9 - 35 umol/L  Glucose, capillary     Status: Abnormal   Collection Time: 08/04/21 11:13 AM  Result Value Ref Range   Glucose-Capillary 202 (H) 70 - 99 mg/dL  Culture, Respiratory w Gram Stain     Status: None (Preliminary result)   Collection Time: 08/04/21 11:53 AM   Specimen: Tracheal Aspirate; Respiratory  Result Value Ref Range   Specimen Description TRACHEAL ASPIRATE    Special Requests NONE    Gram Stain      FEW SQUAMOUS EPITHELIAL CELLS PRESENT FEW WBC SEEN FEW GRAM POSITIVE COCCI MODERATE GRAM NEGATIVE RODS Performed at [facility], [address] [place], [place] [address]    Culture PENDING    Report Status PENDING   Glucose, capillary     Status: Abnormal   Collection Time: 08/04/21  3:39 PM  Result Value Ref Range   Glucose-Capillary 244 (H) 70 - 99 mg/dL  Glucose, capillary     Status: Abnormal    Collection Time: 08/04/21  7:25 PM  Result Value Ref Range   Glucose-Capillary 221 (H) 70 - 99 mg/dL  Glucose, capillary     Status: Abnormal   Collection Time: 08/04/21 11:16 PM  Result Value Ref Range   Glucose-Capillary 186 (H) 70 - 99 mg/dL  Glucose, capillary     Status: Abnormal   Collection Time: 08/05/21  3:10 AM  Result Value Ref Range   Glucose-Capillary 190 (H) 70 - 99 mg/dL  CBC     Status: Abnormal   Collection Time: 08/05/21  5:38 AM  Result Value Ref Range   WBC 6.4 4.0 - 10.5 K/uL   RBC 2.44 (L) 4.22 - 5.81 MIL/uL   Hemoglobin 8.5 (L) 13.0 - 17.0 g/dL   HCT 41.6 (L) 38.4 - 53.6 %   MCV 108.2 (H) 80.0 - 100.0 fL   MCH 34.8 (H) 26.0 - 34.0  pg   MCHC 32.2 30.0 - 36.0 g/dL   RDW 74.9 44.9 - 67.5 %   Platelets 187 150 - 400 K/uL   nRBC 0.3 (H) 0.0 - 0.2 %  Basic metabolic panel     Status: Abnormal   Collection Time: 08/05/21  5:38 AM  Result Value Ref Range   Sodium 138 135 - 145 mmol/L   Potassium 4.5 3.5 - 5.1 mmol/L   Chloride 109 98 - 111 mmol/L   CO2 24 22 - 32 mmol/L   Glucose, Bld 222 (H) 70 - 99 mg/dL   BUN 27 (H) 8 - 23 mg/dL   Creatinine, Ser 9.16 0.61 - 1.24 mg/dL   Calcium 8.1 (L) 8.9 - 10.3 mg/dL   GFR, Estimated >38 >46 mL/min   Anion gap 5 5 - 15  Glucose, capillary     Status: Abnormal   Collection Time: 08/05/21  7:21 AM  Result Value Ref Range   Glucose-Capillary 197 (H) 70 - 99 mg/dL    Assessment & Plan: Present on Admission:  Fall    LOS: 9 days   Additional comments:I reviewed the patient's new clinical lab test results. . Fall off ladder   R PTX - CT placed in ED, CT to WS, o/p 20 so CXR now and likely D/C chest tube  R rib fractures - aggressive multimodal pain control, IS, pulm toilet Pelvic fx (L sacrum, R iliac, b/l inf and L sup pubic rami) with pelvic hematoma - ortho c/s, Dr. Carola Frost, SI screw 11/10 Acute hypoxic ventilator dependent respiratory failure - intubated 11/9, self-extubated and promptly re-intubated 11/12 AM.  PSV as tol. Resp cx sent 11/16 H/o alcohol abuse - on librium, decrease spiritus frumenti COPD - prn duonebs Hyperglycemia - SSI, add glargine 5u daily HTN - home lisinopril held for low BP Hx of Hep C FEN: NPO, OGT, TF, +BM DVT - SCDs, LMWH Foley - placed for retention 11/10. Removed 11/16 AM for TOV, on urecholine. Required I&O x 3 so likely replace foley. Add Rapaflo PICC - placed 11/10 Dispo - ICU I spoke with his sister at the bedside. Critical Care Total Time*: 39 Minutes  Violeta Gelinas, MD, MPH, FACS Trauma & General Surgery Use [email or web address] to contact on call provider  08/05/2021  *Care during the described time interval was provided by me. I have reviewed this patient's available data, including medical history, events of note, physical examination and test results as part of my evaluation.

## 2021-08-06 ENCOUNTER — Inpatient Hospital Stay (HOSPITAL_COMMUNITY): Payer: BC Managed Care – PPO

## 2021-08-06 ENCOUNTER — Ambulatory Visit: Payer: BC Managed Care – PPO | Admitting: Interventional Cardiology

## 2021-08-06 LAB — BASIC METABOLIC PANEL
Anion gap: 4 — ABNORMAL LOW (ref 5–15)
BUN: 27 mg/dL — ABNORMAL HIGH (ref 8–23)
CO2: 26 mmol/L (ref 22–32)
Calcium: 8.3 mg/dL — ABNORMAL LOW (ref 8.9–10.3)
Chloride: 109 mmol/L (ref 98–111)
Creatinine, Ser: 0.58 mg/dL — ABNORMAL LOW (ref 0.61–1.24)
GFR, Estimated: >60 mL/min (ref >60)
Glucose, Bld: 176 mg/dL — ABNORMAL HIGH (ref 70–99)
Potassium: 4.5 mmol/L (ref 3.5–5.1)
Sodium: 139 mmol/L (ref 135–145)

## 2021-08-06 LAB — CBC
HCT: 25.8 % — ABNORMAL LOW (ref 39.0–52.0)
Hemoglobin: 8.1 g/dL — ABNORMAL LOW (ref 13.0–17.0)
MCH: 34 pg (ref 26.0–34.0)
MCHC: 31.4 g/dL (ref 30.0–36.0)
MCV: 108.4 fL — ABNORMAL HIGH (ref 80.0–100.0)
Platelets: 183 10*3/uL (ref 150–400)
RBC: 2.38 MIL/uL — ABNORMAL LOW (ref 4.22–5.81)
RDW: 13.2 % (ref 11.5–15.5)
WBC: 5.8 10*3/uL (ref 4.0–10.5)
nRBC: 0 % (ref 0.0–0.2)

## 2021-08-06 LAB — GLUCOSE, CAPILLARY
Glucose-Capillary: 170 mg/dL — ABNORMAL HIGH (ref 70–99)
Glucose-Capillary: 169 mg/dL — ABNORMAL HIGH (ref 70–99)
Glucose-Capillary: 162 mg/dL — ABNORMAL HIGH (ref 70–99)
Glucose-Capillary: 161 mg/dL — ABNORMAL HIGH (ref 70–99)
Glucose-Capillary: 132 mg/dL — ABNORMAL HIGH (ref 70–99)
Glucose-Capillary: 116 mg/dL — ABNORMAL HIGH (ref 70–99)

## 2021-08-06 MED ORDER — CHLORDIAZEPOXIDE HCL 5 MG PO CAPS
15.0000 mg | ORAL_CAPSULE | Freq: Three times a day (TID) | ORAL | Status: DC
Start: 2021-08-06 — End: 2021-08-13
  Administered 2021-08-06 – 2021-08-12 (×21): 15 mg
  Filled 2021-08-06 (×21): qty 3

## 2021-08-06 MED ORDER — SODIUM CHLORIDE 0.9 % IV SOLN
2.0000 g | INTRAVENOUS | Status: AC
  Administered 2021-08-06 – 2021-08-12 (×7): 2 g via INTRAVENOUS
  Filled 2021-08-06 (×7): qty 20

## 2021-08-06 MED ORDER — QUETIAPINE FUMARATE 200 MG PO TABS
200.0000 mg | ORAL_TABLET | Freq: Two times a day (BID) | ORAL | Status: DC
  Administered 2021-08-06 – 2021-08-24 (×37): 200 mg
  Filled 2021-08-06 (×8): qty 1
  Filled 2021-08-06: qty 2
  Filled 2021-08-06 (×9): qty 1
  Filled 2021-08-06: qty 2
  Filled 2021-08-06 (×4): qty 1
  Filled 2021-08-06: qty 2
  Filled 2021-08-06 (×14): qty 1

## 2021-08-06 NOTE — Procedures (Signed)
Cortrak  Person Inserting Tube:  Tell Rozelle E, RD Tube Type:  Cortrak - 43 inches Tube Size:  10 Tube Location:  Left nare Initial Placement:  Stomach Secured by: Bridle Technique Used to Measure Tube Placement:  Marking at nare/corner of mouth Cortrak Secured At:  70 cm  Cortrak Tube Team Note:  Consult received to place a Cortrak feeding tube.   X-ray is required, abdominal x-ray has been ordered by the Cortrak team. Please confirm tube placement before using the Cortrak tube.   If the tube becomes dislodged please keep the tube and contact the Cortrak team at [email or web address] ([email or web address]) for replacement.  If after hours and replacement cannot be delayed, place a NG tube and confirm placement with an abdominal x-ray.     Shemekia Patane A., MS, RD, LDN (she/her/hers) RD pager number and weekend/on-call pager number located in Amion.   

## 2021-08-06 NOTE — Progress Notes (Signed)
Trauma/Critical Care Follow Up Note  Subjective:    Overnight Issues:   Objective:  Vital signs for last 24 hours: Temp:  [98.3 F (36.8 C)-100 F (37.8 C)] 99.1 F (37.3 C) (11/18 0400) Pulse Rate:  [69-108] 93 (11/18 0800) Resp:  [18-32] 29 (11/18 0800) BP: (97-148)/(62-92) 111/70 (11/18 0800) SpO2:  [95 %-99 %] 98 % (11/18 0800) Arterial Line BP: (96-160)/(50-92) 125/60 (11/18 0800) FiO2 (%):  [40 %] 40 % (11/18 0742) Weight:  [112.9 kg] 112.9 kg (11/18 0400)  Hemodynamic parameters for last 24 hours:    Intake/Output from previous day: 11/17 0701 - 11/18 0700 In: 2717.2 [I.V.:1107.2; NG/GT:1610] Out: 1830 [Urine:1810; Chest Tube:20]  Intake/Output this shift: Total I/O In: 57.7 [I.V.:57.7] Out: 125 [Urine:125]  Vent settings for last 24 hours: Vent Mode: PSV;CPAP FiO2 (%):  [40 %] 40 % Set Rate:  [20 bmp] 20 bmp Vt Set:  [650 mL] 650 mL PEEP:  [5 cmH20] 5 cmH20 Pressure Support:  [8 cmH20] 8 cmH20 Plateau Pressure:  [17 cmH20-21 cmH20] 21 cmH20  Physical Exam:  Gen: comfortable, no distress Neuro: non-focal exam, f/c HEENT: PERRL Neck: supple CV: RRR Pulm: unlabored breathing on PSV 8/5 Abd: soft, NT GU: clear yellow urine Extr: wwp, trace edema   Results for orders placed or performed during the hospital encounter of 07/27/21 (from the past 24 hour(s))  Glucose, capillary     Status: Abnormal   Collection Time: 08/05/21 11:20 AM  Result Value Ref Range   Glucose-Capillary 199 (H) 70 - 99 mg/dL  Glucose, capillary     Status: Abnormal   Collection Time: 08/05/21  3:39 PM  Result Value Ref Range   Glucose-Capillary 194 (H) 70 - 99 mg/dL  Glucose, capillary     Status: Abnormal   Collection Time: 08/05/21  7:16 PM  Result Value Ref Range   Glucose-Capillary 183 (H) 70 - 99 mg/dL  Glucose, capillary     Status: Abnormal   Collection Time: 08/05/21 11:12 PM  Result Value Ref Range   Glucose-Capillary 158 (H) 70 - 99 mg/dL  CBC     Status:  Abnormal   Collection Time: 08/06/21  2:43 AM  Result Value Ref Range   WBC 5.8 4.0 - 10.5 K/uL   RBC 2.38 (L) 4.22 - 5.81 MIL/uL   Hemoglobin 8.1 (L) 13.0 - 17.0 g/dL   HCT 01.7 (L) 49.4 - 49.6 %   MCV 108.4 (H) 80.0 - 100.0 fL   MCH 34.0 26.0 - 34.0 pg   MCHC 31.4 30.0 - 36.0 g/dL   RDW 75.9 16.3 - 84.6 %   Platelets 183 150 - 400 K/uL   nRBC 0.0 0.0 - 0.2 %  Basic metabolic panel     Status: Abnormal   Collection Time: 08/06/21  2:43 AM  Result Value Ref Range   Sodium 139 135 - 145 mmol/L   Potassium 4.5 3.5 - 5.1 mmol/L   Chloride 109 98 - 111 mmol/L   CO2 26 22 - 32 mmol/L   Glucose, Bld 176 (H) 70 - 99 mg/dL   BUN 27 (H) 8 - 23 mg/dL   Creatinine, Ser 6.59 (L) 0.61 - 1.24 mg/dL   Calcium 8.3 (L) 8.9 - 10.3 mg/dL   GFR, Estimated >93 >57 mL/min   Anion gap 4 (L) 5 - 15  Glucose, capillary     Status: Abnormal   Collection Time: 08/06/21  3:11 AM  Result Value Ref Range   Glucose-Capillary 170 (H) 70 -  99 mg/dL  Glucose, capillary     Status: Abnormal   Collection Time: 08/06/21  7:31 AM  Result Value Ref Range   Glucose-Capillary 169 (H) 70 - 99 mg/dL    Assessment & Plan: The plan of care was discussed with the bedside nurse for the day, Joni Reining, who is in agreement with this plan and no additional concerns were raised.   Present on Admission:  Fall    LOS: 10 days   Additional comments:I reviewed the patient's new clinical lab test results.   and I reviewed the patients new imaging test results.    Fall off ladder   R PTX - CT placed in ED, CT removed 11/17 R rib fractures - aggressive multimodal pain control, IS, pulm toilet Pelvic fx (L sacrum, R iliac, b/l inf and L sup pubic rami) with pelvic hematoma - ortho c/s, Dr. Carola Frost, SI screw 11/10 Acute hypoxic ventilator dependent respiratory failure - intubated 11/9, self-extubated and promptly re-intubated 11/12 AM. PSV as tol. Resp cx sent 11/16 with H flu. Start CTX x7d. H/o alcohol abuse - on librium,  decrease spiritus frumenti COPD - prn duonebs Hyperglycemia - SSI, glargine 5u daily HTN - home lisinopril held for low BP Hx of Hep C FEN: NPO, OGT, TF, +BM DVT - SCDs, LMWH Foley - replaced for retention 11/17. On Rapaflo. PICC - placed 11/10 ID - CTX x7d for H flu in 111/16 resp cx Dispo - ICU  Critical Care Total Time: 45 minutes  Diamantina Monks, MD Trauma & General Surgery Please use [email or web address] to contact on call provider  08/06/2021  *Care during the described time interval was provided by me. I have reviewed this patient's available data, including medical history, events of note, physical examination and test results as part of my evaluation.

## 2021-08-07 LAB — GLUCOSE, CAPILLARY
Glucose-Capillary: 128 mg/dL — ABNORMAL HIGH (ref 70–99)
Glucose-Capillary: 139 mg/dL — ABNORMAL HIGH (ref 70–99)
Glucose-Capillary: 141 mg/dL — ABNORMAL HIGH (ref 70–99)
Glucose-Capillary: 145 mg/dL — ABNORMAL HIGH (ref 70–99)
Glucose-Capillary: 146 mg/dL — ABNORMAL HIGH (ref 70–99)
Glucose-Capillary: 154 mg/dL — ABNORMAL HIGH (ref 70–99)

## 2021-08-07 LAB — TRIGLYCERIDES: Triglycerides: 131 mg/dL (ref <150)

## 2021-08-07 NOTE — Progress Notes (Signed)
Trauma/Critical Care Follow Up Note  Subjective:    Overnight Issues:   Objective:  Vital signs for last 24 hours: Temp:  [99 F (37.2 C)-100.2 F (37.9 C)] 99.6 F (37.6 C) (11/19 0400) Pulse Rate:  [68-107] 77 (11/19 0800) Resp:  [15-29] 27 (11/19 0800) BP: (80-135)/(50-86) 91/68 (11/19 0800) SpO2:  [88 %-100 %] 96 % (11/19 0800) Arterial Line BP: (138)/(65) 138/65 (11/18 0900) FiO2 (%):  [40 %] 40 % (11/19 0400) Weight:  [115.6 kg] 115.6 kg (11/19 0400)  Hemodynamic parameters for last 24 hours:    Intake/Output from previous day: 11/18 0701 - 11/19 0700 In: 2997.9 [I.V.:892.9; NG/GT:2005; IV Piggyback:100] Out: 1875 [Urine:1875]  Intake/Output this shift: No intake/output data recorded.  Vent settings for last 24 hours: Vent Mode: PRVC FiO2 (%):  [40 %] 40 % Set Rate:  [20 bmp-25 bmp] 25 bmp Vt Set:  [650 mL] 650 mL PEEP:  [5 cmH20] 5 cmH20 Plateau Pressure:  [17 cmH20-26 cmH20] 23 cmH20  Physical Exam:  Gen: comfortable, no distress Neuro: non-focal exam HEENT: PERRL Neck: supple CV: RRR Pulm: unlabored breathing Abd: soft, NT GU: clear yellow urine Extr: wwp, no edema   Results for orders placed or performed during the hospital encounter of 07/27/21 (from the past 24 hour(s))  Glucose, capillary     Status: Abnormal   Collection Time: 08/06/21 11:20 AM  Result Value Ref Range   Glucose-Capillary 162 (H) 70 - 99 mg/dL  Glucose, capillary     Status: Abnormal   Collection Time: 08/06/21  3:28 PM  Result Value Ref Range   Glucose-Capillary 161 (H) 70 - 99 mg/dL  Glucose, capillary     Status: Abnormal   Collection Time: 08/06/21  7:17 PM  Result Value Ref Range   Glucose-Capillary 132 (H) 70 - 99 mg/dL  Glucose, capillary     Status: Abnormal   Collection Time: 08/06/21 11:07 PM  Result Value Ref Range   Glucose-Capillary 116 (H) 70 - 99 mg/dL  Glucose, capillary     Status: Abnormal   Collection Time: 08/07/21  3:17 AM  Result Value Ref  Range   Glucose-Capillary 145 (H) 70 - 99 mg/dL  Triglycerides     Status: None   Collection Time: 08/07/21  5:00 AM  Result Value Ref Range   Triglycerides 131 <150 mg/dL  Glucose, capillary     Status: Abnormal   Collection Time: 08/07/21  7:31 AM  Result Value Ref Range   Glucose-Capillary 128 (H) 70 - 99 mg/dL    Assessment & Plan: The plan of care was discussed with the bedside nurse for the day, Lauren, who is in agreement with this plan and no additional concerns were raised.   Present on Admission:  Fall    LOS: 11 days   Additional comments:I reviewed the patient's new clinical lab test results.   and I reviewed the patients new imaging test results.    Fall off ladder   R PTX - CT placed in ED, CT removed 11/17 R rib fractures - aggressive multimodal pain control, IS, pulm toilet Pelvic fx (L sacrum, R iliac, b/l inf and L sup pubic rami) with pelvic hematoma - ortho c/s, Dr. Carola Frost, SI screw 11/10 Acute hypoxic ventilator dependent respiratory failure - intubated 11/9, self-extubated and promptly re-intubated 11/12 AM. PSV as tol. Resp cx sent 11/16 with H flu. Start CTX x7d, end 11/24. H/o alcohol abuse - on librium, decrease spiritus frumenti COPD - prn duonebs Hyperglycemia -  SSI, glargine 5u daily HTN - home lisinopril held for low BP Hx of Hep C FEN: NPO, OGT, TF, +BM DVT - SCDs, LMWH Foley - replaced for retention 11/17. On Rapaflo. PICC - placed 11/10 ID - CTX x7d for H flu in 11/16 resp cx Dispo - ICU  Clinical update provided to family at bedside.   Critical Care Total Time: 40 minutes  Diamantina Monks, MD Trauma & General Surgery Please use [email or web address] to contact on call provider  08/07/2021  *Care during the described time interval was provided by me. I have reviewed this patient's available data, including medical history, events of note, physical examination and test results as part of my evaluation.

## 2021-08-07 NOTE — Progress Notes (Signed)
Per RN, pt was being cleaned up.   Increased WOB noted, increased RR and minute volume > 20 lpm.  Pt was placed back on full vent support at approx 1655.  Currently VSS, sat 98% and pt appears comfortable on full vent support.

## 2021-08-07 NOTE — Progress Notes (Signed)
Wasted 11 mL of fentanyl from previous tubing with Contractor.

## 2021-08-08 LAB — COMPREHENSIVE METABOLIC PANEL
ALT: 26 U/L (ref 0–44)
AST: 20 U/L (ref 15–41)
Albumin: 2.4 g/dL — ABNORMAL LOW (ref 3.5–5.0)
Alkaline Phosphatase: 107 U/L (ref 38–126)
Anion gap: 9 (ref 5–15)
BUN: 34 mg/dL — ABNORMAL HIGH (ref 8–23)
CO2: 26 mmol/L (ref 22–32)
Calcium: 8.8 mg/dL — ABNORMAL LOW (ref 8.9–10.3)
Chloride: 108 mmol/L (ref 98–111)
Creatinine, Ser: 0.65 mg/dL (ref 0.61–1.24)
GFR, Estimated: >60 mL/min (ref >60)
Glucose, Bld: 149 mg/dL — ABNORMAL HIGH (ref 70–99)
Potassium: 4.5 mmol/L (ref 3.5–5.1)
Sodium: 143 mmol/L (ref 135–145)
Total Bilirubin: 0.4 mg/dL (ref 0.3–1.2)
Total Protein: 5.9 g/dL — ABNORMAL LOW (ref 6.5–8.1)

## 2021-08-08 LAB — URINALYSIS, ROUTINE W REFLEX MICROSCOPIC
Bilirubin Urine: NEGATIVE
Glucose, UA: NEGATIVE mg/dL
Hgb urine dipstick: NEGATIVE
Ketones, ur: NEGATIVE mg/dL
Nitrite: NEGATIVE
Protein, ur: NEGATIVE mg/dL
Specific Gravity, Urine: 1.015 (ref 1.005–1.030)
pH: 6 (ref 5.0–8.0)

## 2021-08-08 LAB — BASIC METABOLIC PANEL
Anion gap: 6 (ref 5–15)
BUN: 33 mg/dL — ABNORMAL HIGH (ref 8–23)
CO2: 26 mmol/L (ref 22–32)
Calcium: 8.5 mg/dL — ABNORMAL LOW (ref 8.9–10.3)
Chloride: 111 mmol/L (ref 98–111)
Creatinine, Ser: 0.68 mg/dL (ref 0.61–1.24)
GFR, Estimated: >60 mL/min (ref >60)
Glucose, Bld: 148 mg/dL — ABNORMAL HIGH (ref 70–99)
Potassium: 4.7 mmol/L (ref 3.5–5.1)
Sodium: 143 mmol/L (ref 135–145)

## 2021-08-08 LAB — POCT I-STAT 7, (LYTES, BLD GAS, ICA,H+H)
Acid-Base Excess: 2 mmol/L (ref 0.0–2.0)
Bicarbonate: 29.2 mmol/L — ABNORMAL HIGH (ref 20.0–28.0)
Calcium, Ion: 1.29 mmol/L (ref 1.15–1.40)
HCT: 34 % — ABNORMAL LOW (ref 39.0–52.0)
Hemoglobin: 11.6 g/dL — ABNORMAL LOW (ref 13.0–17.0)
O2 Saturation: 91 %
Patient temperature: 100.6
Potassium: 4.6 mmol/L (ref 3.5–5.1)
Sodium: 145 mmol/L (ref 135–145)
TCO2: 31 mmol/L (ref 22–32)
pCO2 arterial: 63.4 mmHg — ABNORMAL HIGH (ref 32.0–48.0)
pH, Arterial: 7.277 — ABNORMAL LOW (ref 7.350–7.450)
pO2, Arterial: 75 mmHg — ABNORMAL LOW (ref 83.0–108.0)

## 2021-08-08 LAB — GLUCOSE, CAPILLARY
Glucose-Capillary: 151 mg/dL — ABNORMAL HIGH (ref 70–99)
Glucose-Capillary: 136 mg/dL — ABNORMAL HIGH (ref 70–99)
Glucose-Capillary: 148 mg/dL — ABNORMAL HIGH (ref 70–99)
Glucose-Capillary: 129 mg/dL — ABNORMAL HIGH (ref 70–99)
Glucose-Capillary: 121 mg/dL — ABNORMAL HIGH (ref 70–99)
Glucose-Capillary: 117 mg/dL — ABNORMAL HIGH (ref 70–99)
Glucose-Capillary: 125 mg/dL — ABNORMAL HIGH (ref 70–99)

## 2021-08-08 LAB — SODIUM, URINE, RANDOM: Sodium, Ur: 12 mmol/L

## 2021-08-08 LAB — URINALYSIS, MICROSCOPIC (REFLEX): Bacteria, UA: NONE SEEN

## 2021-08-08 LAB — CBC
HCT: 24.8 % — ABNORMAL LOW (ref 39.0–52.0)
Hemoglobin: 7.5 g/dL — ABNORMAL LOW (ref 13.0–17.0)
MCH: 34.1 pg — ABNORMAL HIGH (ref 26.0–34.0)
MCHC: 30.2 g/dL (ref 30.0–36.0)
MCV: 112.7 fL — ABNORMAL HIGH (ref 80.0–100.0)
Platelets: 238 10*3/uL (ref 150–400)
RBC: 2.2 MIL/uL — ABNORMAL LOW (ref 4.22–5.81)
RDW: 13.6 % (ref 11.5–15.5)
WBC: 6.1 10*3/uL (ref 4.0–10.5)
nRBC: 0 % (ref 0.0–0.2)

## 2021-08-08 LAB — CORTISOL: Cortisol, Plasma: 8 ug/dL

## 2021-08-08 LAB — OSMOLALITY, URINE: Osmolality, Ur: 577 mOsm/kg (ref 300–900)

## 2021-08-08 LAB — OSMOLALITY: Osmolality: 311 mOsm/kg — ABNORMAL HIGH (ref 275–295)

## 2021-08-08 LAB — CREATININE, URINE, RANDOM: Creatinine, Urine: 107.76 mg/dL

## 2021-08-08 MED ORDER — FUROSEMIDE 10 MG/ML IJ SOLN
INTRAMUSCULAR | Status: AC
  Administered 2021-08-08: 20 mg via INTRAVENOUS
  Filled 2021-08-08: qty 2

## 2021-08-08 MED ORDER — FUROSEMIDE 10 MG/ML IJ SOLN: 20.0000 mg | Freq: Once | INTRAMUSCULAR | Status: AC

## 2021-08-08 MED ORDER — FLUDROCORTISONE ACETATE 0.1 MG PO TABS
0.2000 mg | ORAL_TABLET | Freq: Every day | ORAL | Status: DC
  Administered 2021-08-08 – 2021-08-16 (×8): 0.2 mg
  Filled 2021-08-08 (×9): qty 2

## 2021-08-08 MED ORDER — SODIUM CHLORIDE 0.9 % IV BOLUS
500.0000 mL | Freq: Once | INTRAVENOUS | Status: AC
  Administered 2021-08-08: 500 mL via INTRAVENOUS

## 2021-08-08 NOTE — Progress Notes (Signed)
Trauma/Critical Care Follow Up Note  Subjective:    Overnight Issues:   Objective:  Vital signs for last 24 hours: Temp:  [99 F (37.2 C)-99.6 F (37.6 C)] 99.6 F (37.6 C) (11/20 0335) Pulse Rate:  [66-105] 70 (11/20 0700) Resp:  [18-37] 20 (11/20 0700) BP: (90-154)/(58-100) 101/66 (11/20 0700) SpO2:  [93 %-100 %] 97 % (11/20 0700) FiO2 (%):  [40 %] 40 % (11/20 0306) Weight:  [113.2 kg] 113.2 kg (11/20 0500)  Hemodynamic parameters for last 24 hours:    Intake/Output from previous day: 11/19 0701 - 11/20 0700 In: 3181.8 [I.V.:645.5; BC/WU:8891.6; IV Piggyback:100] Out: 2045 [Urine:2045]  Intake/Output this shift: No intake/output data recorded.  Vent settings for last 24 hours: Vent Mode: PRVC FiO2 (%):  [40 %] 40 % Set Rate:  [20 bmp] 20 bmp Vt Set:  [650 mL] 650 mL PEEP:  [5 cmH20] 5 cmH20 Pressure Support:  [12 cmH20] 12 cmH20 Plateau Pressure:  [15 cmH20-17 cmH20] 17 cmH20  Physical Exam:  Gen: comfortable, no distress Neuro: non-focal exam HEENT: PERRL Neck: supple CV: RRR Pulm: unlabored breathing Abd: soft, NT GU: clear yellow urine Extr: wwp, no edema   Results for orders placed or performed during the hospital encounter of 07/27/21 (from the past 24 hour(s))  Glucose, capillary     Status: Abnormal   Collection Time: 08/07/21 11:07 AM  Result Value Ref Range   Glucose-Capillary 154 (H) 70 - 99 mg/dL  Glucose, capillary     Status: Abnormal   Collection Time: 08/07/21  4:20 PM  Result Value Ref Range   Glucose-Capillary 139 (H) 70 - 99 mg/dL  Glucose, capillary     Status: Abnormal   Collection Time: 08/07/21  7:31 PM  Result Value Ref Range   Glucose-Capillary 141 (H) 70 - 99 mg/dL   Comment 1 Notify RN    Comment 2 Document in Chart   Glucose, capillary     Status: Abnormal   Collection Time: 08/07/21 11:48 PM  Result Value Ref Range   Glucose-Capillary 146 (H) 70 - 99 mg/dL   Comment 1 Notify RN    Comment 2 Document in Chart    Glucose, capillary     Status: Abnormal   Collection Time: 08/08/21  3:36 AM  Result Value Ref Range   Glucose-Capillary 151 (H) 70 - 99 mg/dL   Comment 1 Notify RN    Comment 2 Document in Chart   Glucose, capillary     Status: Abnormal   Collection Time: 08/08/21  7:59 AM  Result Value Ref Range   Glucose-Capillary 136 (H) 70 - 99 mg/dL    Assessment & Plan: The plan of care was discussed with the bedside nurse for the day, Lauren, who is in agreement with this plan and no additional concerns were raised.   Present on Admission:  Fall    LOS: 12 days   Additional comments:I reviewed the patient's new clinical lab test results.   and I reviewed the patients new imaging test results.    Fall off ladder   R PTX - CT placed in ED, CT removed 11/17 R rib fractures - aggressive multimodal pain control, IS, pulm toilet Pelvic fx (L sacrum, R iliac, b/l inf and L sup pubic rami) with pelvic hematoma - ortho c/s, Dr. Carola Frost, SI screw 11/10 Acute hypoxic ventilator dependent respiratory failure - intubated 11/9, self-extubated and promptly re-intubated 11/12 AM. PSV as tol. Resp cx sent 11/16 with H flu. Start CTX x7d,  end 11/24. Weaned well yest PM. Trial of extubation this AM.  H/o alcohol abuse - on librium, decrease spiritus frumenti COPD - prn duonebs Hyperglycemia - SSI, glargine 5u daily HTN - home lisinopril held for low BP Hx of Hep C FEN: NPO, OGT, TF, +BM DVT - SCDs, LMWH Foley - replaced for retention 11/17. On Rapaflo. PICC - placed 11/10 ID - CTX x7d for H flu in 11/16 resp cx, end 11/24 Dispo - ICU  Critical Care Total Time: 45 minutes  Diamantina Monks, MD Trauma & General Surgery Please use [email or web address] to contact on call provider  08/08/2021  *Care during the described time interval was provided by me. I have reviewed this patient's available data, including medical history, events of note, physical examination and test results as part of my  evaluation.

## 2021-08-09 LAB — COMPREHENSIVE METABOLIC PANEL
ALT: 22 U/L (ref 0–44)
AST: 18 U/L (ref 15–41)
Albumin: 2.2 g/dL — ABNORMAL LOW (ref 3.5–5.0)
Alkaline Phosphatase: 103 U/L (ref 38–126)
Anion gap: 5 (ref 5–15)
BUN: 35 mg/dL — ABNORMAL HIGH (ref 8–23)
CO2: 26 mmol/L (ref 22–32)
Calcium: 8.1 mg/dL — ABNORMAL LOW (ref 8.9–10.3)
Chloride: 112 mmol/L — ABNORMAL HIGH (ref 98–111)
Creatinine, Ser: 0.74 mg/dL (ref 0.61–1.24)
GFR, Estimated: >60 mL/min (ref >60)
Glucose, Bld: 133 mg/dL — ABNORMAL HIGH (ref 70–99)
Potassium: 4.1 mmol/L (ref 3.5–5.1)
Sodium: 143 mmol/L (ref 135–145)
Total Bilirubin: 0.5 mg/dL (ref 0.3–1.2)
Total Protein: 5.4 g/dL — ABNORMAL LOW (ref 6.5–8.1)

## 2021-08-09 LAB — GLUCOSE, CAPILLARY
Glucose-Capillary: 132 mg/dL — ABNORMAL HIGH (ref 70–99)
Glucose-Capillary: 137 mg/dL — ABNORMAL HIGH (ref 70–99)
Glucose-Capillary: 112 mg/dL — ABNORMAL HIGH (ref 70–99)
Glucose-Capillary: 118 mg/dL — ABNORMAL HIGH (ref 70–99)
Glucose-Capillary: 119 mg/dL — ABNORMAL HIGH (ref 70–99)
Glucose-Capillary: 104 mg/dL — ABNORMAL HIGH (ref 70–99)

## 2021-08-09 LAB — POCT I-STAT 7, (LYTES, BLD GAS, ICA,H+H)
Acid-Base Excess: 2 mmol/L (ref 0.0–2.0)
Bicarbonate: 27.6 mmol/L (ref 20.0–28.0)
Calcium, Ion: 1.3 mmol/L (ref 1.15–1.40)
HCT: 21 % — ABNORMAL LOW (ref 39.0–52.0)
Hemoglobin: 7.1 g/dL — ABNORMAL LOW (ref 13.0–17.0)
O2 Saturation: 95 %
Patient temperature: 99
Potassium: 4.3 mmol/L (ref 3.5–5.1)
Sodium: 146 mmol/L — ABNORMAL HIGH (ref 135–145)
TCO2: 29 mmol/L (ref 22–32)
pCO2 arterial: 47.2 mmHg (ref 32.0–48.0)
pH, Arterial: 7.376 (ref 7.350–7.450)
pO2, Arterial: 81 mmHg — ABNORMAL LOW (ref 83.0–108.0)

## 2021-08-09 LAB — CBC
HCT: 23.6 % — ABNORMAL LOW (ref 39.0–52.0)
Hemoglobin: 7 g/dL — ABNORMAL LOW (ref 13.0–17.0)
MCH: 33.8 pg (ref 26.0–34.0)
MCHC: 29.7 g/dL — ABNORMAL LOW (ref 30.0–36.0)
MCV: 114 fL — ABNORMAL HIGH (ref 80.0–100.0)
Platelets: 237 10*3/uL (ref 150–400)
RBC: 2.07 MIL/uL — ABNORMAL LOW (ref 4.22–5.81)
RDW: 13.7 % (ref 11.5–15.5)
WBC: 5.2 10*3/uL (ref 4.0–10.5)
nRBC: 0 % (ref 0.0–0.2)

## 2021-08-09 LAB — PHOSPHORUS: Phosphorus: 4.1 mg/dL (ref 2.5–4.6)

## 2021-08-09 LAB — MAGNESIUM: Magnesium: 2.3 mg/dL (ref 1.7–2.4)

## 2021-08-09 MED ORDER — RACEPINEPHRINE HCL 2.25 % IN NEBU
0.5000 mL | INHALATION_SOLUTION | Freq: Once | RESPIRATORY_TRACT | Status: AC
  Administered 2021-08-09: 0.5 mL via RESPIRATORY_TRACT

## 2021-08-09 MED ORDER — RACEPINEPHRINE HCL 2.25 % IN NEBU
INHALATION_SOLUTION | RESPIRATORY_TRACT | Status: AC
  Filled 2021-08-09: qty 0.5

## 2021-08-09 MED ORDER — FREE WATER
250.0000 mL | Freq: Four times a day (QID) | Status: DC
  Administered 2021-08-09 – 2021-08-30 (×84): 250 mL

## 2021-08-09 MED ORDER — MORPHINE SULFATE (PF) 2 MG/ML IV SOLN: 2.0000 mg | INTRAVENOUS | Status: DC | PRN

## 2021-08-09 MED ORDER — MIDAZOLAM HCL 2 MG/2ML IJ SOLN: 1.0000 mg | Freq: Once | INTRAMUSCULAR | Status: AC

## 2021-08-09 MED ORDER — LACTATED RINGERS IV BOLUS
1000.0000 mL | Freq: Once | INTRAVENOUS | Status: AC
  Administered 2021-08-09: 1000 mL via INTRAVENOUS

## 2021-08-09 NOTE — Procedures (Signed)
Extubation Procedure Note  Patient Details:   Name: [name] DOB: 1956-07-07 MRN: [id number]   Airway Documentation:    Vent end date: 08/09/21 Vent end time: 0849   Evaluation  O2 sats: stable throughout Complications: No apparent complications Patient did tolerate procedure well. Bilateral Breath Sounds: Diminished, Expiratory wheezes   Yes  Patient extubated to bipap per MD order. Positive cuff leak. Patient placed on bipap and vitals are stable. MD at bedside.  Harmon Dun Orest Dygert 08/09/2021, 8:52 AM

## 2021-08-09 NOTE — Progress Notes (Signed)
Pt extremely agitated with increased WOB, tachypnea and tachycardia. Dr. Bedelia Person at bedside. RT called for racemic Epi. Pt given 1 mg Versed x2 per Dr. Marvetta Gibbons order. Pt responded well to both interventions. RR decreased to 30s and pt appears more comfortable. VSS. Will con't to closely monitor.

## 2021-08-09 NOTE — Progress Notes (Signed)
Dr. Cliffton Asters of Trauma paged about patient's hypotension. New orders received for 1 liter bolus of lactated ringers. Bolus started, sedation titrated down. Will continue to monitor.

## 2021-08-09 NOTE — Progress Notes (Signed)
Trauma/Critical Care Follow Up Note  Subjective:    Overnight Issues:   Objective:  Vital signs for last 24 hours: Temp:  [99 F (37.2 C)-99.8 F (37.7 C)] 99.2 F (37.3 C) (11/21 0800) Pulse Rate:  [66-112] 104 (11/21 0848) Resp:  [19-31] 31 (11/21 0848) BP: (84-122)/(54-84) 108/68 (11/21 0800) SpO2:  [91 %-100 %] 93 % (11/21 0848) FiO2 (%):  [40 %] 40 % (11/21 0849) Weight:  [116.6 kg] 116.6 kg (11/21 0500)  Hemodynamic parameters for last 24 hours:    Intake/Output from previous day: 11/20 0701 - 11/21 0700 In: 4627.1 [I.V.:596.3; KN/LZ:7673.4; IV Piggyback:1568.3] Out: 2410 [Urine:2410]  Intake/Output this shift: Total I/O In: 121.9 [I.V.:21.9; NG/GT:100] Out: 180 [Urine:180]  Vent settings for last 24 hours: Vent Mode: PCV;BIPAP FiO2 (%):  [40 %] 40 % Set Rate:  [16 bmp-20 bmp] 16 bmp Vt Set:  [650 mL] 650 mL PEEP:  [5 cmH20] 5 cmH20 Pressure Support:  [12 cmH20] 12 cmH20 Plateau Pressure:  [17 cmH20-22 cmH20] 22 cmH20  Physical Exam:  Gen: comfortable, no distress Neuro: non-focal exam, f/c HEENT: PERRL Neck: supple CV: RRR Pulm: unlabored breathing Abd: soft, NT GU: clear yellow urine Extr: wwp, no edema   Results for orders placed or performed during the hospital encounter of 07/27/21 (from the past 24 hour(s))  I-STAT 7, (LYTES, BLD GAS, ICA, H+H)     Status: Abnormal   Collection Time: 08/08/21  9:24 AM  Result Value Ref Range   pH, Arterial 7.277 (L) 7.350 - 7.450   pCO2 arterial 63.4 (H) 32.0 - 48.0 mmHg   pO2, Arterial 75 (L) 83.0 - 108.0 mmHg   Bicarbonate 29.2 (H) 20.0 - 28.0 mmol/L   TCO2 31 22 - 32 mmol/L   O2 Saturation 91.0 %   Acid-Base Excess 2.0 0.0 - 2.0 mmol/L   Sodium 145 135 - 145 mmol/L   Potassium 4.6 3.5 - 5.1 mmol/L   Calcium, Ion 1.29 1.15 - 1.40 mmol/L   HCT 34.0 (L) 39.0 - 52.0 %   Hemoglobin 11.6 (L) 13.0 - 17.0 g/dL   Patient temperature 193.7 F    Collection site RADIAL, ALLEN'S TEST ACCEPTABLE    Drawn by  RT    Sample type ARTERIAL   Comprehensive metabolic panel     Status: Abnormal   Collection Time: 08/08/21 11:34 AM  Result Value Ref Range   Sodium 143 135 - 145 mmol/L   Potassium 4.5 3.5 - 5.1 mmol/L   Chloride 108 98 - 111 mmol/L   CO2 26 22 - 32 mmol/L   Glucose, Bld 149 (H) 70 - 99 mg/dL   BUN 34 (H) 8 - 23 mg/dL   Creatinine, Ser 9.02 0.61 - 1.24 mg/dL   Calcium 8.8 (L) 8.9 - 10.3 mg/dL   Total Protein 5.9 (L) 6.5 - 8.1 g/dL   Albumin 2.4 (L) 3.5 - 5.0 g/dL   AST 20 15 - 41 U/L   ALT 26 0 - 44 U/L   Alkaline Phosphatase 107 38 - 126 U/L   Total Bilirubin 0.4 0.3 - 1.2 mg/dL   GFR, Estimated >40 >97 mL/min   Anion gap 9 5 - 15  Osmolality     Status: Abnormal   Collection Time: 08/08/21 11:34 AM  Result Value Ref Range   Osmolality 311 (H) 275 - 295 mOsm/kg  Osmolality, urine     Status: None   Collection Time: 08/08/21 11:34 AM  Result Value Ref Range   Osmolality, Ur  577 300 - 900 mOsm/kg  Sodium, urine, random     Status: None   Collection Time: 08/08/21 11:34 AM  Result Value Ref Range   Sodium, Ur 12 mmol/L  Creatinine, urine, random     Status: None   Collection Time: 08/08/21 11:34 AM  Result Value Ref Range   Creatinine, Urine 107.76 mg/dL  Urinalysis, Routine w reflex microscopic Urine, Catheterized     Status: Abnormal   Collection Time: 08/08/21 11:34 AM  Result Value Ref Range   Color, Urine YELLOW YELLOW   APPearance CLEAR CLEAR   Specific Gravity, Urine 1.015 1.005 - 1.030   pH 6.0 5.0 - 8.0   Glucose, UA NEGATIVE NEGATIVE mg/dL   Hgb urine dipstick NEGATIVE NEGATIVE   Bilirubin Urine NEGATIVE NEGATIVE   Ketones, ur NEGATIVE NEGATIVE mg/dL   Protein, ur NEGATIVE NEGATIVE mg/dL   Nitrite NEGATIVE NEGATIVE   Leukocytes,Ua TRACE (A) NEGATIVE  Cortisol, Random     Status: None   Collection Time: 08/08/21 11:34 AM  Result Value Ref Range   Cortisol, Plasma 8.0 ug/dL  Urinalysis, Microscopic (reflex)     Status: None   Collection Time: 08/08/21  11:34 AM  Result Value Ref Range   RBC / HPF 0-5 0 - 5 RBC/hpf   WBC, UA 0-5 0 - 5 WBC/hpf   Bacteria, UA NONE SEEN NONE SEEN   Squamous Epithelial / LPF 0-5 0 - 5  Glucose, capillary     Status: Abnormal   Collection Time: 08/08/21 11:37 AM  Result Value Ref Range   Glucose-Capillary 148 (H) 70 - 99 mg/dL  Glucose, capillary     Status: Abnormal   Collection Time: 08/08/21  3:48 PM  Result Value Ref Range   Glucose-Capillary 129 (H) 70 - 99 mg/dL  Glucose, capillary     Status: Abnormal   Collection Time: 08/08/21  7:18 PM  Result Value Ref Range   Glucose-Capillary 121 (H) 70 - 99 mg/dL  Glucose, capillary     Status: Abnormal   Collection Time: 08/08/21 11:18 PM  Result Value Ref Range   Glucose-Capillary 117 (H) 70 - 99 mg/dL  Glucose, capillary     Status: Abnormal   Collection Time: 08/08/21 11:23 PM  Result Value Ref Range   Glucose-Capillary 125 (H) 70 - 99 mg/dL  Glucose, capillary     Status: Abnormal   Collection Time: 08/09/21  3:28 AM  Result Value Ref Range   Glucose-Capillary 132 (H) 70 - 99 mg/dL  I-STAT 7, (LYTES, BLD GAS, ICA, H+H)     Status: Abnormal   Collection Time: 08/09/21  4:04 AM  Result Value Ref Range   pH, Arterial 7.376 7.350 - 7.450   pCO2 arterial 47.2 32.0 - 48.0 mmHg   pO2, Arterial 81 (L) 83.0 - 108.0 mmHg   Bicarbonate 27.6 20.0 - 28.0 mmol/L   TCO2 29 22 - 32 mmol/L   O2 Saturation 95.0 %   Acid-Base Excess 2.0 0.0 - 2.0 mmol/L   Sodium 146 (H) 135 - 145 mmol/L   Potassium 4.3 3.5 - 5.1 mmol/L   Calcium, Ion 1.30 1.15 - 1.40 mmol/L   HCT 21.0 (L) 39.0 - 52.0 %   Hemoglobin 7.1 (L) 13.0 - 17.0 g/dL   Patient temperature 74.9 F    Collection site RADIAL, ALLEN'S TEST ACCEPTABLE    Drawn by RT    Sample type ARTERIAL   CBC     Status: Abnormal   Collection Time: 08/09/21  5:28 AM  Result Value Ref Range   WBC 5.2 4.0 - 10.5 K/uL   RBC 2.07 (L) 4.22 - 5.81 MIL/uL   Hemoglobin 7.0 (L) 13.0 - 17.0 g/dL   HCT 18.5 (L) 63.1 - 49.7 %    MCV 114.0 (H) 80.0 - 100.0 fL   MCH 33.8 26.0 - 34.0 pg   MCHC 29.7 (L) 30.0 - 36.0 g/dL   RDW 02.6 37.8 - 58.8 %   Platelets 237 150 - 400 K/uL   nRBC 0.0 0.0 - 0.2 %  Comprehensive metabolic panel     Status: Abnormal   Collection Time: 08/09/21  5:28 AM  Result Value Ref Range   Sodium 143 135 - 145 mmol/L   Potassium 4.1 3.5 - 5.1 mmol/L   Chloride 112 (H) 98 - 111 mmol/L   CO2 26 22 - 32 mmol/L   Glucose, Bld 133 (H) 70 - 99 mg/dL   BUN 35 (H) 8 - 23 mg/dL   Creatinine, Ser 5.02 0.61 - 1.24 mg/dL   Calcium 8.1 (L) 8.9 - 10.3 mg/dL   Total Protein 5.4 (L) 6.5 - 8.1 g/dL   Albumin 2.2 (L) 3.5 - 5.0 g/dL   AST 18 15 - 41 U/L   ALT 22 0 - 44 U/L   Alkaline Phosphatase 103 38 - 126 U/L   Total Bilirubin 0.5 0.3 - 1.2 mg/dL   GFR, Estimated >77 >41 mL/min   Anion gap 5 5 - 15  Magnesium     Status: None   Collection Time: 08/09/21  5:28 AM  Result Value Ref Range   Magnesium 2.3 1.7 - 2.4 mg/dL  Phosphorus     Status: None   Collection Time: 08/09/21  5:28 AM  Result Value Ref Range   Phosphorus 4.1 2.5 - 4.6 mg/dL  Glucose, capillary     Status: Abnormal   Collection Time: 08/09/21  8:24 AM  Result Value Ref Range   Glucose-Capillary 137 (H) 70 - 99 mg/dL    Assessment & Plan:  Present on Admission:  Fall    LOS: 13 days   Additional comments:I reviewed the patient's new clinical lab test results.   and I reviewed the patients new imaging test results.    Fall off ladder   R PTX - CT placed in ED, CT removed 11/17 R rib fractures - aggressive multimodal pain control, IS, pulm toilet Pelvic fx (L sacrum, R iliac, b/l inf and L sup pubic rami) with pelvic hematoma - ortho c/s, Dr. Carola Frost, SI screw 11/10 Acute hypoxic ventilator dependent respiratory failure - intubated 11/9, self-extubated and promptly re-intubated 11/12 AM. Weaned well all day yest. Trial of extubation this AM. Plan to extubate to bipap. Low threshold for reintubation. H/o alcohol abuse - on  librium, spiritus frumenti Acidosis - combined respiratory and metabolic, sent renin/aldo to r/o RTA type 4 (send out lab), empirically started fludricortisone yest.  Volume overload - still appears extravascularly volume up, but slightly intravascularly dry based on labs. Slow bolus today. Incr FW flushes per tube COPD - prn duonebs, guaifenesin Hyperglycemia - SSI, glargine 5u daily HTN - home lisinopril held for low BP Hx of Hep C FEN: NPO, OGT, TF, +BM DVT - SCDs, LMWH Foley - replaced for retention 11/17. On Rapaflo. TOV 11/21 PM PICC - placed 11/10 ID - CTX x7d for H flu in 11/16 resp cx, end 11/24 Dispo - ICU  Critical Care Total Time: 45 minutes  Diamantina Monks, MD Trauma &  General Surgery Please use [email or web address] to contact on call provider  08/09/2021  *Care during the described time interval was provided by me. I have reviewed this patient's available data, including medical history, events of note, physical examination and test results as part of my evaluation.

## 2021-08-09 NOTE — Progress Notes (Signed)
RT called to bedside for racemic. Racemic given through bipap. RR in the high 30's. MD and RN at bedside. RT will monitor. No new orders at this time.

## 2021-08-10 ENCOUNTER — Inpatient Hospital Stay (HOSPITAL_COMMUNITY): Payer: BC Managed Care – PPO | Admitting: Certified Registered Nurse Anesthetist

## 2021-08-10 ENCOUNTER — Inpatient Hospital Stay (HOSPITAL_COMMUNITY): Payer: BC Managed Care – PPO

## 2021-08-10 ENCOUNTER — Encounter (HOSPITAL_COMMUNITY)
Admission: EM | Disposition: A | Discharge status: Nursing Home (Includes ICF) | Payer: Self-pay | Source: Home / Self Care

## 2021-08-10 ENCOUNTER — Inpatient Hospital Stay (HOSPITAL_COMMUNITY): Payer: BC Managed Care – PPO | Admitting: Anesthesiology

## 2021-08-10 HISTORY — PX: TRACHEOSTOMY TUBE PLACEMENT: SHX814

## 2021-08-10 LAB — POCT I-STAT 7, (LYTES, BLD GAS, ICA,H+H)
Acid-Base Excess: 1 mmol/L (ref 0.0–2.0)
Acid-Base Excess: 2 mmol/L (ref 0.0–2.0)
Bicarbonate: 28.3 mmol/L — ABNORMAL HIGH (ref 20.0–28.0)
Bicarbonate: 27.9 mmol/L (ref 20.0–28.0)
Calcium, Ion: 1.33 mmol/L (ref 1.15–1.40)
Calcium, Ion: 1.31 mmol/L (ref 1.15–1.40)
HCT: 21 % — ABNORMAL LOW (ref 39.0–52.0)
HCT: 21 % — ABNORMAL LOW (ref 39.0–52.0)
Hemoglobin: 7.1 g/dL — ABNORMAL LOW (ref 13.0–17.0)
Hemoglobin: 7.1 g/dL — ABNORMAL LOW (ref 13.0–17.0)
O2 Saturation: 98 %
O2 Saturation: 92 %
Patient temperature: 98
Patient temperature: 97.9
Potassium: 4.7 mmol/L (ref 3.5–5.1)
Potassium: 4.9 mmol/L (ref 3.5–5.1)
Sodium: 146 mmol/L — ABNORMAL HIGH (ref 135–145)
Sodium: 145 mmol/L (ref 135–145)
TCO2: 30 mmol/L (ref 22–32)
TCO2: 29 mmol/L (ref 22–32)
pCO2 arterial: 64.1 mmHg — ABNORMAL HIGH (ref 32.0–48.0)
pCO2 arterial: 51.9 mmHg — ABNORMAL HIGH (ref 32.0–48.0)
pH, Arterial: 7.251 — ABNORMAL LOW (ref 7.350–7.450)
pH, Arterial: 7.336 — ABNORMAL LOW (ref 7.350–7.450)
pO2, Arterial: 116 mmHg — ABNORMAL HIGH (ref 83.0–108.0)
pO2, Arterial: 67 mmHg — ABNORMAL LOW (ref 83.0–108.0)

## 2021-08-10 LAB — CBC
HCT: 25.6 % — ABNORMAL LOW (ref 39.0–52.0)
Hemoglobin: 7.7 g/dL — ABNORMAL LOW (ref 13.0–17.0)
MCH: 34.2 pg — ABNORMAL HIGH (ref 26.0–34.0)
MCHC: 30.1 g/dL (ref 30.0–36.0)
MCV: 113.8 fL — ABNORMAL HIGH (ref 80.0–100.0)
Platelets: 228 10*3/uL (ref 150–400)
RBC: 2.25 MIL/uL — ABNORMAL LOW (ref 4.22–5.81)
RDW: 13.4 % (ref 11.5–15.5)
WBC: 5.5 10*3/uL (ref 4.0–10.5)
nRBC: 0 % (ref 0.0–0.2)

## 2021-08-10 LAB — COMPREHENSIVE METABOLIC PANEL
ALT: 20 U/L (ref 0–44)
AST: 19 U/L (ref 15–41)
Albumin: 2.4 g/dL — ABNORMAL LOW (ref 3.5–5.0)
Alkaline Phosphatase: 133 U/L — ABNORMAL HIGH (ref 38–126)
Anion gap: 5 (ref 5–15)
BUN: 31 mg/dL — ABNORMAL HIGH (ref 8–23)
CO2: 26 mmol/L (ref 22–32)
Calcium: 8.5 mg/dL — ABNORMAL LOW (ref 8.9–10.3)
Chloride: 112 mmol/L — ABNORMAL HIGH (ref 98–111)
Creatinine, Ser: 0.68 mg/dL (ref 0.61–1.24)
GFR, Estimated: >60 mL/min (ref >60)
Glucose, Bld: 104 mg/dL — ABNORMAL HIGH (ref 70–99)
Potassium: 4.6 mmol/L (ref 3.5–5.1)
Sodium: 143 mmol/L (ref 135–145)
Total Bilirubin: 0.6 mg/dL (ref 0.3–1.2)
Total Protein: 5.8 g/dL — ABNORMAL LOW (ref 6.5–8.1)

## 2021-08-10 LAB — GLUCOSE, CAPILLARY
Glucose-Capillary: 107 mg/dL — ABNORMAL HIGH (ref 70–99)
Glucose-Capillary: 108 mg/dL — ABNORMAL HIGH (ref 70–99)
Glucose-Capillary: 105 mg/dL — ABNORMAL HIGH (ref 70–99)
Glucose-Capillary: 104 mg/dL — ABNORMAL HIGH (ref 70–99)
Glucose-Capillary: 110 mg/dL — ABNORMAL HIGH (ref 70–99)
Glucose-Capillary: 110 mg/dL — ABNORMAL HIGH (ref 70–99)

## 2021-08-10 SURGERY — CREATION, TRACHEOSTOMY
Anesthesia: General | Site: Neck

## 2021-08-10 MED ORDER — PROSOURCE TF PO LIQD
90.0000 mL | Freq: Two times a day (BID) | ORAL | Status: DC
  Administered 2021-08-10 – 2021-08-30 (×40): 90 mL
  Filled 2021-08-10 (×40): qty 90

## 2021-08-10 MED ORDER — PHENYLEPHRINE HCL-NACL 20-0.9 MG/250ML-% IV SOLN
INTRAVENOUS | Status: DC | PRN
  Administered 2021-08-10: 20 ug/min via INTRAVENOUS

## 2021-08-10 MED ORDER — MIDAZOLAM HCL 2 MG/2ML IJ SOLN
INTRAMUSCULAR | Status: DC | PRN
Start: 2021-08-10 — End: 2021-08-10
  Administered 2021-08-10: 2 mg via INTRAVENOUS

## 2021-08-10 MED ORDER — JEVITY 1.5 CAL/FIBER PO LIQD
1000.0000 mL | ORAL | Status: DC
  Administered 2021-08-10 – 2021-08-29 (×22): 1000 mL
  Filled 2021-08-10 (×33): qty 1000

## 2021-08-10 MED ORDER — ETOMIDATE 2 MG/ML IV SOLN
INTRAVENOUS | Status: DC | PRN
  Administered 2021-08-10: 10 mg via INTRAVENOUS

## 2021-08-10 MED ORDER — MIDAZOLAM HCL 2 MG/2ML IJ SOLN
INTRAMUSCULAR | Status: AC
  Filled 2021-08-10: qty 2

## 2021-08-10 MED ORDER — LIDOCAINE-EPINEPHRINE 1.5 %-1:200,000 OPTIME - NO CHARGE
INTRAMUSCULAR | Status: DC | PRN
  Administered 2021-08-10: 5 mL via SUBCUTANEOUS

## 2021-08-10 MED ORDER — DEXAMETHASONE SODIUM PHOSPHATE 10 MG/ML IJ SOLN
INTRAMUSCULAR | Status: DC | PRN
  Administered 2021-08-10: 10 mg via INTRAVENOUS

## 2021-08-10 MED ORDER — 0.9 % SODIUM CHLORIDE (POUR BTL) OPTIME
TOPICAL | Status: DC | PRN
  Administered 2021-08-10: 1000 mL

## 2021-08-10 MED ORDER — SUCCINYLCHOLINE CHLORIDE 200 MG/10ML IV SOSY
PREFILLED_SYRINGE | INTRAVENOUS | Status: AC
  Administered 2021-08-10: 160 mg
  Filled 2021-08-10: qty 10

## 2021-08-10 MED ORDER — ROCURONIUM BROMIDE 10 MG/ML (PF) SYRINGE
PREFILLED_SYRINGE | INTRAVENOUS | Status: DC | PRN
Start: 2021-08-10 — End: 2021-08-10
  Administered 2021-08-10 (×2): 50 mg via INTRAVENOUS

## 2021-08-10 MED ORDER — FENTANYL CITRATE (PF) 250 MCG/5ML IJ SOLN
INTRAMUSCULAR | Status: DC | PRN
  Administered 2021-08-10: 50 ug via INTRAVENOUS

## 2021-08-10 MED ORDER — SODIUM CHLORIDE 0.9 % IV BOLUS
500.0000 mL | Freq: Once | INTRAVENOUS | Status: AC
  Administered 2021-08-10: 500 mL via INTRAVENOUS

## 2021-08-10 MED ORDER — ETOMIDATE 2 MG/ML IV SOLN
INTRAVENOUS | Status: AC
  Administered 2021-08-10: 100 mg
  Filled 2021-08-10: qty 10

## 2021-08-10 MED ORDER — LACTATED RINGERS IV SOLN: INTRAVENOUS | Status: DC | PRN

## 2021-08-10 MED ORDER — SUCCINYLCHOLINE CHLORIDE 200 MG/10ML IV SOSY
PREFILLED_SYRINGE | INTRAVENOUS | Status: DC | PRN
  Administered 2021-08-10: 100 mg via INTRAVENOUS

## 2021-08-10 MED ORDER — FENTANYL CITRATE (PF) 250 MCG/5ML IJ SOLN
INTRAMUSCULAR | Status: AC
  Filled 2021-08-10: qty 5

## 2021-08-10 MED ORDER — ALBUMIN HUMAN 25 % IV SOLN
12.5000 g | Freq: Once | INTRAVENOUS | Status: AC
  Administered 2021-08-10: 12.5 g via INTRAVENOUS
  Filled 2021-08-10: qty 50

## 2021-08-10 SURGICAL SUPPLY — 32 items
BAG COUNTER SPONGE SURGICOUNT (BAG) ×2 IMPLANT
BAG SPNG CNTER NS LX DISP (BAG) ×1
BLADE CLIPPER SURG (BLADE) ×2 IMPLANT
CANISTER SUCT 3000ML PPV (MISCELLANEOUS) IMPLANT
COVER SURGICAL LIGHT HANDLE (MISCELLANEOUS) ×2 IMPLANT
DRAPE UTILITY XL STRL (DRAPES) ×2 IMPLANT
ELECT CAUTERY BLADE 6.4 (BLADE) ×2 IMPLANT
ELECT REM PT RETURN 9FT ADLT (ELECTROSURGICAL) ×2
ELECTRODE REM PT RTRN 9FT ADLT (ELECTROSURGICAL) ×1 IMPLANT
GAUZE 4X4 16PLY ~~LOC~~+RFID DBL (SPONGE) ×2 IMPLANT
GLOVE SRG 8 PF TXTR STRL LF DI (GLOVE) ×1 IMPLANT
GLOVE SURG ENC MOIS LTX SZ8 (GLOVE) ×2 IMPLANT
GLOVE SURG UNDER POLY LF SZ8 (GLOVE) ×2
GOWN STRL REUS W/ TWL LRG LVL3 (GOWN DISPOSABLE) ×2 IMPLANT
GOWN STRL REUS W/ TWL XL LVL3 (GOWN DISPOSABLE) ×1 IMPLANT
GOWN STRL REUS W/TWL LRG LVL3 (GOWN DISPOSABLE) ×4
GOWN STRL REUS W/TWL XL LVL3 (GOWN DISPOSABLE) ×2
INTRODUCER TRACH BLUE RHINO 6F (TUBING) ×2 IMPLANT
INTRODUCER TRACH BLUE RHINO 8F (TUBING) IMPLANT
KIT BASIN OR (CUSTOM PROCEDURE TRAY) ×2 IMPLANT
KIT TURNOVER KIT B (KITS) ×2 IMPLANT
NS IRRIG 1000ML POUR BTL (IV SOLUTION) ×2 IMPLANT
PACK EENT II TURBAN DRAPE (CUSTOM PROCEDURE TRAY) ×2 IMPLANT
PAD ARMBOARD 7.5X6 YLW CONV (MISCELLANEOUS) ×4 IMPLANT
PENCIL BUTTON HOLSTER BLD 10FT (ELECTRODE) ×2 IMPLANT
SPONGE INTESTINAL PEANUT (DISPOSABLE) ×2 IMPLANT
SUT VICRYL AB 3 0 TIES (SUTURE) ×2 IMPLANT
SYR 20ML LL LF (SYRINGE) ×2 IMPLANT
TOWEL GREEN STERILE (TOWEL DISPOSABLE) ×2 IMPLANT
TOWEL GREEN STERILE FF (TOWEL DISPOSABLE) ×2 IMPLANT
TUBE CONNECTING 12X1/4 (SUCTIONS) ×2 IMPLANT
TUBE TRACH 6.0 EXL PROX CUF (TUBING) ×2 IMPLANT

## 2021-08-10 NOTE — Progress Notes (Signed)
Spoke with pt's Daughter, [name], on the phone.  Updated her on the intubation and events of last night.  She requested to be updated on the plan for the pt after rounds this am.

## 2021-08-10 NOTE — Anesthesia Procedure Notes (Signed)
Procedure Name: Intubation Date/Time: 08/10/2021 3:19 AM Performed by: Andre Swander T, CRNA Pre-anesthesia Checklist: Patient identified, Emergency Drugs available, Suction available and Patient being monitored Patient Re-evaluated:Patient Re-evaluated prior to induction Oxygen Delivery Method: Ambu bag Preoxygenation: Pre-oxygenation with 100% oxygen Induction Type: IV induction, Rapid sequence and Cricoid Pressure applied Ventilation: Mask ventilation without difficulty Laryngoscope Size: Glidescope and 4 Grade View: Grade I Tube type: Oral Tube size: 7.5 mm Number of attempts: 1 Airway Equipment and Method: Stylet and Oral airway Placement Confirmation: ETT inserted through vocal cords under direct vision, positive ETCO2 and breath sounds checked- equal and bilateral Secured at: 24 cm Tube secured with: Tape Dental Injury: Teeth and Oropharynx as per pre-operative assessment

## 2021-08-10 NOTE — Op Note (Signed)
  08/10/2021  2:25 PM  PATIENT:  [name]  65 y.o. male  PRE-OPERATIVE DIAGNOSIS:  RESPIRATORY FAILURE  POST-OPERATIVE DIAGNOSIS:  RESPIRATORY FAILURE  PROCEDURE:  Procedure(s): TRACHEOSTOMY #6 XLT LONG PROXIMAL  SURGEON:  Surgeon(s): Violeta Gelinas, MD  ASSISTANTS: Trixie Deis, PA-C   ANESTHESIA:   local and general  EBL:  Total I/O In: 458.9 [I.V.:169; NG/GT:150; IV Piggyback:139.9] Out: -   BLOOD ADMINISTERED:none  DRAINS: none   SPECIMEN:  No Specimen  DISPOSITION OF SPECIMEN:  N/A  COUNTS:  YES  DICTATION: .Dragon Dictation Procedure in detail: Informed consent was obtained.  He is on IV antibiotics.  He was taken directly to the operating room directly from the intensive care unit on the ventilator.  General anesthesia was administered by the anesthesia staff.  He was appropriately positioned and padded.  His anterior neck was prepped and draped in sterile fashion.  We did a timeout procedure.  I injected local cephalad to the sternal notch.  A vertical incision was made 2 cm cephalad to the sternal notch.  Subcutaneous tissues were dissected down using cautery.  We continued the dissection through the platysma and identified the strap muscles.  Further dissection continued through the thyroid isthmus.  This was divided with cautery achieving excellent hemostasis.  I then palpated the anterior trachea while the endotracheal tube was withdrawn several centimeters.  I then placed Angiocath between the second and third tracheal ring followed by the guidewire.  The small Blue Rhino dilator was then passed.  Next, the large Blue Rhino dilator was inserted.  Finally, a #6 XLT long proximal trach was placed over a dilator into the trachea.  It passed very easily.  The inner Cannula was inserted and the cuff was inflated.  It was hooked up to the ventilator circuit and excellent volume returns were obtained.  Sats remained 99% throughout.  I placed a couple 2-0 Prolene  stitches at the superior portion of the incision to close it a bit.  The trach was sutured to the skin with 2-0 Prolene's and we placed a Velcro trach tie.  He tolerated the procedure well without apparent complication.  All counts were correct at the completion of the procedure.  He was taken directly back to the intensive care unit in critical condition on the ventilator. PATIENT DISPOSITION:  ICU - intubated and critically ill.   Delay start of Pharmacological VTE agent (>24hrs) due to surgical blood loss or risk of bleeding:  no  Violeta Gelinas, MD, MPH, FACS Pager: [phone]  11/22/20222:25 PM

## 2021-08-10 NOTE — Anesthesia Postprocedure Evaluation (Signed)
Anesthesia Post Note  Patient: [name]  Procedure(s) Performed: TRACHEOSTOMY (Neck)     Patient location during evaluation: SICU Anesthesia Type: General Level of consciousness: sedated Pain management: pain level controlled Vital Signs Assessment: post-procedure vital signs reviewed and stable Respiratory status: patient remains intubated per anesthesia plan Cardiovascular status: stable Postop Assessment: no apparent nausea or vomiting Anesthetic complications: no   No notable events documented.  Last Vitals:  Vitals:   08/10/21 1300 08/10/21 1444  BP: 110/78   Pulse: 63 66  Resp: 18 16  Temp:    SpO2: 100% 96%    Last Pain:  Vitals:   08/10/21 1200  TempSrc: Axillary  PainSc:                  Catheryn Bacon Shetara Launer

## 2021-08-10 NOTE — Progress Notes (Signed)
Dr. Sheliah Hatch notified of the progression of patients intubation process and how pt went bradycardic. No new orders received.

## 2021-08-10 NOTE — Progress Notes (Signed)
Patient ID: [name], male   DOB: 1956/03/07, 65 y.o.   MRN: [id number] I updated his daughter at the bedside. Violeta Gelinas, MD, MPH, FACS Please use [email or web address] to contact on call provider

## 2021-08-10 NOTE — Progress Notes (Addendum)
Nutrition Follow-up  DOCUMENTATION CODES:   Not applicable  INTERVENTION:   Tube feeding via Cortrak: Change TF Jevity 1.5 at 65 ml/h (1560 ml per day) Prosource TF 90 ml BID  Provides 2500 kcal, 143 gm protein, 1185 ml free water daily   Free water: 250 ml every 6 hours Total free water: 2185 ml    NUTRITION DIAGNOSIS:   Increased nutrient needs related to post-op healing as evidenced by estimated needs. Ongoing.   GOAL:   Patient will meet greater than or equal to 90% of their needs Met.   MONITOR:   TF tolerance  REASON FOR ASSESSMENT:   Consult, Ventilator Enteral/tube feeding initiation and management  ASSESSMENT:   Pt with PMH of ETOH abuse, COPD, HTN, GERD, and hepatitis C admitted after fall from ladder with R PTX s/p CT, R rib fxs, and pelvic fx s/p fixation.   Pt discussed during ICU rounds and with RN.  Pt in OR for trach, pt continues to have loose stools, will adjust TF to Jevity 1.5 for added fiber.    11/10 s/p screw fixation of pelvic ring  11/12 self-extubated and re-intubated 11/18 cortrak placed; tip gastric  11/21 extubated; re-intubated 11/22 trach today  Patient is currently intubated on ventilator support Temp (24hrs), Avg:98.9 F (37.2 C), Min:97.9 F (36.6 C), Max:99.8 F (37.7 C)  Medications reviewed and include: vitamin D3, folic acid, SSI, Semglee, MVI with minerals, miralax, vodka BID, thiamine  Precedex   Labs reviewed: Vitamin D: 21.5 CBG's: 190-197  Current TF:  Pivot 1.5 at 70 ml/h  Provides 2520 kcal, 157 gm protein, 1275 ml free water daily  Diet Order:   Diet Order             Diet NPO time specified  Diet effective now                   EDUCATION NEEDS:   Not appropriate for education at this time  Skin:  Skin Assessment:  (R hip incision)  Last BM:  11/22 - unmeasured via rectal tube  Height:   Ht Readings from Last 1 Encounters:  07/27/21 '6\' 2"'  (1.88 m)    Weight:   Wt Readings  from Last 1 Encounters:  08/09/21 116.6 kg    BMI:  Body mass index is 33 kg/m.  Estimated Nutritional Needs:   Kcal:  2400-2600  Protein:  135-145 grams  Fluid:  > 2 L/day  Lockie Pares., RD, LDN, CNSC See AMiON for contact information

## 2021-08-10 NOTE — Anesthesia Preprocedure Evaluation (Addendum)
Anesthesia Evaluation  Patient identified by MRN, date of birth, ID band Patient unresponsive    Reviewed: Allergy & Precautions, NPO status , Patient's Chart, lab work & pertinent test results  Airway Mallampati: Intubated       Dental   Pulmonary sleep apnea , COPD,  COPD inhaler, former smoker,  Intubated with 7.5 oral ETT      + intubated    Cardiovascular hypertension, Pt. on medications + CAD  Normal cardiovascular exam  ECG: NSR, rate 87   Neuro/Psych Sedated on dexmedetomidine infusion negative psych ROS   GI/Hepatic GERD  Medicated and Controlled,(+)     substance abuse  alcohol use and marijuana use, Hepatitis -  Endo/Other  negative endocrine ROS  Renal/GU negative Renal ROS     Musculoskeletal negative musculoskeletal ROS (+)   Abdominal (+) + obese,   Peds  Hematology  (+) anemia , HLD   Anesthesia Other Findings Respiratory failure  Reproductive/Obstetrics                            Anesthesia Physical Anesthesia Plan  ASA: 4  Anesthesia Plan: General   Post-op Pain Management:    Induction: Intravenous  PONV Risk Score and Plan: 2 and Ondansetron, Dexamethasone, Midazolam and Treatment may vary due to age or medical condition  Airway Management Planned: Oral ETT and Tracheostomy  Additional Equipment:   Intra-op Plan:   Post-operative Plan: Post-operative intubation/ventilation  Informed Consent: I have reviewed the patients History and Physical, chart, labs and discussed the procedure including the risks, benefits and alternatives for the proposed anesthesia with the patient or authorized representative who has indicated his/her understanding and acceptance.     Consent reviewed with POA  Plan Discussed with: Surgeon and CRNA  Anesthesia Plan Comments: (Anesthetic plan discussed with daughter)       Anesthesia Quick Evaluation

## 2021-08-10 NOTE — Transfer of Care (Signed)
Immediate Anesthesia Transfer of Care Note  Patient: [name]  Procedure(s) Performed: TRACHEOSTOMY (Neck)  Patient Location: ICU  Anesthesia Type:General  Level of Consciousness: Patient remains intubated per anesthesia plan  Airway & Oxygen Therapy: Patient remains intubated per anesthesia plan and Patient placed on Ventilator (see vital sign flow sheet for setting)  Post-op Assessment: Report given to RN and Post -op Vital signs reviewed and stable  Post vital signs: Reviewed and stable  Last Vitals:  Vitals Value Taken Time  BP 101/67   Temp    Pulse 68 08/10/21 1442  Resp 17 08/10/21 1442  SpO2 98 % 08/10/21 1442  Vitals shown include unvalidated device data.  Last Pain:  Vitals:   08/10/21 1200  TempSrc: Axillary  PainSc:       Patients Stated Pain Goal: 2 (07/27/21 1800)  Complications: No notable events documented.

## 2021-08-10 NOTE — Progress Notes (Addendum)
RT at bedside to assess patient for BIPAP. Patient has copious amount of oral secretions. Weak cough. Rt NTS for moderate amount of thick secretions. Per RN she has NTS x3 this shift. CPT performed through bed.Patient alert & oriented X3. Currently on 10L HFNC. Spo2 96%. Concern for airway protection.RN aware. BIPAP held due to oral secretions.

## 2021-08-10 NOTE — Progress Notes (Signed)
TRN received a call at approximately 0230 from 4NICU staff members stating patients oxygen status is continuing to decrease. Primary RN stated that the patients oxygen status has dropped to as low as 80% and is on 15lpm, high flow nasal canula. Primary RN states pt has had deep suction multiple times, and after each deep suction, oxygen saturation increases, but then decreases. On exam, pt noted to have some diaphoresis and diminished right lower lobe sounds and rhonchi bilaterally on ascultation. RT at bedside and deep suctioned oral and nasal routs and pt noted to not gag or cough. Dr. Sheliah Hatch was notified at [phone] and decision was made to intubate patient. Anesthesia called for intubation at 02:55. Anesthesia arrived and intubated patient with etomidate and succinylcholine. During intubation, patients heart rate dropped into the 30s, but resumed to being in the 60-90 bpm within approximately 30 seconds. Anesthesia stated they will also be placing a note and documenting medications given. Primary RN of patient had to override medication from pyxis for anesthesia team and charting under the [facility] that it was given by anesthesia. X-ray obtained chest x-ray after intubation.

## 2021-08-10 NOTE — Progress Notes (Signed)
Patient ID: [name], male   DOB: February 29, 1956, 65 y.o.   MRN: [id number] Follow up - Trauma Critical Care  Patient Details:    [name] is an 65 y.o. male.  Lines/tubes : Airway 7.5 mm (Active)  Secured at (cm) 24 cm 08/10/21 0720  Measured From Lips 08/10/21 0720  Secured Location Left 08/10/21 0720  Secured By [employer] 08/10/21 0720  Tube Holder Repositioned Yes 08/10/21 0720  Cuff Pressure (cm H2O) Clear OR 27-39 CmH2O 08/10/21 0720  Site Condition Dry 08/10/21 0720     PICC Triple Lumen 07/29/21 PICC Right Basilic 43 cm 0 cm (Active)  Indication for Insertion or Continuance of Line Prolonged intravenous therapies 08/09/21 2000  Exposed Catheter (cm) 0 cm 07/29/21 1635  Site Assessment Clean;Dry;Intact 08/09/21 2000  Lumen #1 Status Infusing;Flushed;Blood return noted 08/09/21 2000  Lumen #2 Status Infusing;Flushed;Blood return noted 08/09/21 2000  Lumen #3 Status Flushed;In-line blood sampling system in place;Blood return noted 08/09/21 2000  Dressing Type Transparent 08/09/21 2000  Dressing Status Clean;Dry;Intact 08/09/21 2000  Antimicrobial disc in place? Yes 08/09/21 2000  Safety Lock Not Applicable 08/08/21 2000  Line Care Connections checked and tightened 08/09/21 2000  Dressing Intervention Dressing changed;Antimicrobial disc changed;Securement device changed 08/06/21 0200  Dressing Change Due 08/13/21 08/09/21 2000     Flatus Tube/Pouch (Active)  Daily care Skin around tube assessed 08/09/21 2000     External Urinary Catheter (Active)  Output (mL) 0 mL 08/10/21 0200    Microbiology/Sepsis markers: Results for orders placed or performed during the hospital encounter of 07/27/21  Resp Panel by RT-PCR (Flu A&B, Covid) Nasopharyngeal Swab     Status: None   Collection Time: 07/27/21  3:34 PM   Specimen: Nasopharyngeal Swab; Nasopharyngeal(NP) swabs in vial transport medium  Result Value Ref Range Status   SARS Coronavirus 2 by RT PCR  NEGATIVE NEGATIVE Final    Comment: (NOTE) SARS-CoV-2 target nucleic acids are NOT DETECTED.  The SARS-CoV-2 RNA is generally detectable in upper respiratory specimens during the acute phase of infection. The lowest concentration of SARS-CoV-2 viral copies this assay can detect is 138 copies/mL. A negative result does not preclude SARS-Cov-2 infection and should not be used as the sole basis for treatment or other patient management decisions. A negative result may occur with  improper specimen collection/handling, submission of specimen other than nasopharyngeal swab, presence of viral mutation(s) within the areas targeted by this assay, and inadequate number of viral copies(<138 copies/mL). A negative result must be combined with clinical observations, patient history, and epidemiological information. The expected result is Negative.  Fact Sheet for Patients:  BloggerCourse.com  Fact Sheet for Healthcare Providers:  SeriousBroker.it  This test is no t yet approved or cleared by the [place] FDA and  has been authorized for detection and/or diagnosis of SARS-CoV-2 by FDA under an Emergency Use Authorization (EUA). This EUA will remain  in effect (meaning this test can be used) for the duration of the COVID-19 declaration under Section 564(b)(1) of the Act, 21 U.S.C.section 360bbb-3(b)(1), unless the authorization is terminated  or revoked sooner.       Influenza A by PCR NEGATIVE NEGATIVE Final   Influenza B by PCR NEGATIVE NEGATIVE Final    Comment: (NOTE) The Xpert Xpress SARS-CoV-2/FLU/RSV plus assay is intended as an aid in the diagnosis of influenza from Nasopharyngeal swab specimens and should not be used as a sole basis for treatment. Nasal washings and aspirates are unacceptable for  Xpert Xpress SARS-CoV-2/FLU/RSV testing.  Fact Sheet for Patients: BloggerCourse.com  Fact Sheet for  Healthcare Providers: SeriousBroker.it  This test is not yet approved or cleared by the [place] FDA and has been authorized for detection and/or diagnosis of SARS-CoV-2 by FDA under an Emergency Use Authorization (EUA). This EUA will remain in effect (meaning this test can be used) for the duration of the COVID-19 declaration under Section 564(b)(1) of the Act, 21 U.S.C. section 360bbb-3(b)(1), unless the authorization is terminated or revoked.  Performed at [facility], [address], [place], [place] [address]   Surgical PCR screen     Status: None   Collection Time: 07/27/21  5:54 PM   Specimen: Nasal Mucosa; Nasal Swab  Result Value Ref Range Status   MRSA, PCR NEGATIVE NEGATIVE Final   Staphylococcus aureus NEGATIVE NEGATIVE Final    Comment: (NOTE) The Xpert SA Assay (FDA approved for NASAL specimens in patients 16 years of age and older), is one component of a comprehensive surveillance program. It is not intended to diagnose infection nor to guide or monitor treatment. Performed at [facility], [address] [place], [place] [address]   Culture, Respiratory w Gram Stain     Status: None   Collection Time: 08/04/21 11:53 AM   Specimen: Tracheal Aspirate; Respiratory  Result Value Ref Range Status   Specimen Description TRACHEAL ASPIRATE  Final   Special Requests NONE  Final   Gram Stain   Final    FEW SQUAMOUS EPITHELIAL CELLS PRESENT FEW WBC SEEN FEW GRAM POSITIVE COCCI MODERATE GRAM NEGATIVE RODS    Culture   Final    ABUNDANT HAEMOPHILUS INFLUENZAE BETA LACTAMASE NEGATIVE Performed at [facility], [address], [place], [place] [address]    Report Status 08/05/2021 FINAL  Final    Anti-infectives:  Anti-infectives (From admission, onward)    Start     Dose/Rate Route Frequency Ordered Stop   08/06/21 0915  cefTRIAXone (ROCEPHIN) 2 g in sodium chloride 0.9 % 100 mL IVPB        2 g 200 mL/hr over 30  Minutes Intravenous Every 24 hours 08/06/21 0818 08/13/21 0959   07/29/21 1800  ceFAZolin (ANCEF) IVPB 2g/100 mL premix        2 g 200 mL/hr over 30 Minutes Intravenous Every 8 hours 07/29/21 1241 07/30/21 1112   07/28/21 0815  ceFAZolin (ANCEF) IVPB 2g/100 mL premix  Status:  Discontinued        2 g 200 mL/hr over 30 Minutes Intravenous On call to O.R. 07/27/21 1744 07/28/21 0900       Best Practice/Protocols:  VTE Prophylaxis: Lovenox (prophylaxtic dose) Continous Sedation  Consults: Treatment Team:  Myrene Galas, MD    Studies:    Events:  Subjective:    Overnight Issues: reintubated  Objective:  Vital signs for last 24 hours: Temp:  [97.9 F (36.6 C)-100 F (37.8 C)] 97.9 F (36.6 C) (11/22 0400) Pulse Rate:  [60-107] 60 (11/22 0720) Resp:  [16-35] 17 (11/22 0720) BP: (87-140)/(59-91) 93/71 (11/22 0700) SpO2:  [80 %-100 %] 100 % (11/22 0720) FiO2 (%):  [40 %-50 %] 50 % (11/22 0720)  Hemodynamic parameters for last 24 hours:    Intake/Output from previous day: 11/21 0701 - 11/22 0700 In: 2386.3 [I.V.:670.5; NG/GT:600; IV Piggyback:1115.8] Out: 2360 [Urine:2360]  Intake/Output this shift: No intake/output data recorded.  Vent settings for last 24 hours: Vent Mode: PRVC FiO2 (%):  [40 %-50 %] 50 %  Set Rate:  [16 bmp] 16 bmp Vt Set:  [650 mL] 650 mL PEEP:  [5 cmH20] 5 cmH20 Plateau Pressure:  [18 cmH20-20 cmH20] 18 cmH20  Physical Exam:  General: on vent Neuro: follows commands HEENT/Neck: ETT Resp: clear to auscultation bilaterally CVS: RRR 60s GI: soft, NT Extremities: 2+ edema  Results for orders placed or performed during the hospital encounter of 07/27/21 (from the past 24 hour(s))  Glucose, capillary     Status: Abnormal   Collection Time: 08/09/21  8:24 AM  Result Value Ref Range   Glucose-Capillary 137 (H) 70 - 99 mg/dL  Glucose, capillary     Status: Abnormal   Collection Time: 08/09/21 11:47 AM  Result Value Ref Range    Glucose-Capillary 112 (H) 70 - 99 mg/dL  Glucose, capillary     Status: Abnormal   Collection Time: 08/09/21  3:41 PM  Result Value Ref Range   Glucose-Capillary 118 (H) 70 - 99 mg/dL  Glucose, capillary     Status: Abnormal   Collection Time: 08/09/21  7:21 PM  Result Value Ref Range   Glucose-Capillary 119 (H) 70 - 99 mg/dL  Glucose, capillary     Status: Abnormal   Collection Time: 08/09/21 11:12 PM  Result Value Ref Range   Glucose-Capillary 104 (H) 70 - 99 mg/dL  I-STAT 7, (LYTES, BLD GAS, ICA, H+H)     Status: Abnormal   Collection Time: 08/10/21  2:56 AM  Result Value Ref Range   pH, Arterial 7.251 (L) 7.350 - 7.450   pCO2 arterial 64.1 (H) 32.0 - 48.0 mmHg   pO2, Arterial 116 (H) 83.0 - 108.0 mmHg   Bicarbonate 28.3 (H) 20.0 - 28.0 mmol/L   TCO2 30 22 - 32 mmol/L   O2 Saturation 98.0 %   Acid-Base Excess 1.0 0.0 - 2.0 mmol/L   Sodium 146 (H) 135 - 145 mmol/L   Potassium 4.7 3.5 - 5.1 mmol/L   Calcium, Ion 1.33 1.15 - 1.40 mmol/L   HCT 21.0 (L) 39.0 - 52.0 %   Hemoglobin 7.1 (L) 13.0 - 17.0 g/dL   Patient temperature 40.9 F    Collection site Femoral    Drawn by Operator    Sample type ARTERIAL   Glucose, capillary     Status: Abnormal   Collection Time: 08/10/21  3:11 AM  Result Value Ref Range   Glucose-Capillary 107 (H) 70 - 99 mg/dL  I-STAT 7, (LYTES, BLD GAS, ICA, H+H)     Status: Abnormal   Collection Time: 08/10/21  4:18 AM  Result Value Ref Range   pH, Arterial 7.336 (L) 7.350 - 7.450   pCO2 arterial 51.9 (H) 32.0 - 48.0 mmHg   pO2, Arterial 67 (L) 83.0 - 108.0 mmHg   Bicarbonate 27.9 20.0 - 28.0 mmol/L   TCO2 29 22 - 32 mmol/L   O2 Saturation 92.0 %   Acid-Base Excess 2.0 0.0 - 2.0 mmol/L   Sodium 145 135 - 145 mmol/L   Potassium 4.9 3.5 - 5.1 mmol/L   Calcium, Ion 1.31 1.15 - 1.40 mmol/L   HCT 21.0 (L) 39.0 - 52.0 %   Hemoglobin 7.1 (L) 13.0 - 17.0 g/dL   Patient temperature 81.1 F    Collection site RADIAL, ALLEN'S TEST ACCEPTABLE    Drawn by  Operator    Sample type ARTERIAL   CBC     Status: Abnormal   Collection Time: 08/10/21  5:17 AM  Result Value Ref Range   WBC 5.5 4.0 -  10.5 K/uL   RBC 2.25 (L) 4.22 - 5.81 MIL/uL   Hemoglobin 7.7 (L) 13.0 - 17.0 g/dL   HCT 17.7 (L) 93.9 - 03.0 %   MCV 113.8 (H) 80.0 - 100.0 fL   MCH 34.2 (H) 26.0 - 34.0 pg   MCHC 30.1 30.0 - 36.0 g/dL   RDW 09.2 33.0 - 07.6 %   Platelets 228 150 - 400 K/uL   nRBC 0.0 0.0 - 0.2 %  Comprehensive metabolic panel     Status: Abnormal   Collection Time: 08/10/21  5:17 AM  Result Value Ref Range   Sodium 143 135 - 145 mmol/L   Potassium 4.6 3.5 - 5.1 mmol/L   Chloride 112 (H) 98 - 111 mmol/L   CO2 26 22 - 32 mmol/L   Glucose, Bld 104 (H) 70 - 99 mg/dL   BUN 31 (H) 8 - 23 mg/dL   Creatinine, Ser 2.26 0.61 - 1.24 mg/dL   Calcium 8.5 (L) 8.9 - 10.3 mg/dL   Total Protein 5.8 (L) 6.5 - 8.1 g/dL   Albumin 2.4 (L) 3.5 - 5.0 g/dL   AST 19 15 - 41 U/L   ALT 20 0 - 44 U/L   Alkaline Phosphatase 133 (H) 38 - 126 U/L   Total Bilirubin 0.6 0.3 - 1.2 mg/dL   GFR, Estimated >33 >35 mL/min   Anion gap 5 5 - 15  Glucose, capillary     Status: Abnormal   Collection Time: 08/10/21  7:39 AM  Result Value Ref Range   Glucose-Capillary 108 (H) 70 - 99 mg/dL    Assessment & Plan: Present on Admission:  Fall    LOS: 14 days   Additional comments:I reviewed the patient's new clinical lab test results. And CXR Fall off ladder   R PTX - CT placed in ED, CT removed 11/17 R rib fractures - aggressive multimodal pain control, IS, pulm toilet Pelvic fx (L sacrum, R iliac, b/l inf and L sup pubic rami) with pelvic hematoma - ortho c/s, Dr. Carola Frost, SI screw 11/10 Acute hypoxic ventilator dependent respiratory failure - intubated 11/9, self-extubated and promptly re-intubated 11/12 AM. Weaned well all day yest. Extubated 11/21 but reintubated last night. Plan trach in OR today - I discussed the procedure, risks, and benefits with his daughter and she agrees. She will  come in and sign the consent. H/o alcohol abuse - on librium, spiritus frumenti Acidosis - combined respiratory and metabolic, sent renin/aldo to r/o RTA type 4 (send out lab), empirically started fludricortisone 11/20  Volume overload - still appears extravascularly volume up, but slightly intravascularly dry based on labs. Albumin 25% x 1 COPD - prn duonebs, guaifenesin Hyperglycemia - SSI, glargine 5u daily HTN - home lisinopril held for low BP Hx of Hep C FEN: NPO, OGT, TF, +BM DVT - SCDs, LMWH Foley - replaced for retention 11/17. On Rapaflo. Foley out 11/21 PICC - placed 11/10 ID - CTX x7d for H flu in 11/16 resp cx, end 11/24 Dispo - ICU, trach today Critical Care Total Time*: 38 Minutes  Violeta Gelinas, MD, MPH, FACS Trauma & General Surgery Use [email or web address] to contact on call provider  08/10/2021  *Care during the described time interval was provided by me. I have reviewed this patient's available data, including medical history, events of note, physical examination and test results as part of my evaluation.

## 2021-08-10 NOTE — Progress Notes (Addendum)
0109:  RT at bedside assessing pt.  Pt has copious amount of secretions even after NT suctioning multiple times throughout night.  Pt unable to cough secretions up.  Paged Dr. Sheliah Hatch due to RT and RN's concern for airway protection.  Pt also hypotensive.  MD made aware.  MD ordered to NT suction pt throughout the night and give a 500 mL bolus of NS.  Will administer NS bolus, suction pt PRN, and continue to closely monitor pt.  0310:  Pt desated into the low 80's, twice within an hour.  TRN and RT called to bedside.  MD notified again of events.  Orders given for pt to be intubated.  Anesthesia notified.  RN called daughter at 24 to give an update.  Daughter did not answer, RN left a message.  Will continue to monitor pt closely.

## 2021-08-11 ENCOUNTER — Encounter (HOSPITAL_COMMUNITY): Payer: Self-pay | Admitting: General Surgery

## 2021-08-11 ENCOUNTER — Inpatient Hospital Stay (HOSPITAL_COMMUNITY): Payer: BC Managed Care – PPO

## 2021-08-11 LAB — COMPREHENSIVE METABOLIC PANEL
ALT: 21 U/L (ref 0–44)
AST: 22 U/L (ref 15–41)
Albumin: 2.4 g/dL — ABNORMAL LOW (ref 3.5–5.0)
Alkaline Phosphatase: 125 U/L (ref 38–126)
Anion gap: 6 (ref 5–15)
BUN: 31 mg/dL — ABNORMAL HIGH (ref 8–23)
CO2: 25 mmol/L (ref 22–32)
Calcium: 8.3 mg/dL — ABNORMAL LOW (ref 8.9–10.3)
Chloride: 112 mmol/L — ABNORMAL HIGH (ref 98–111)
Creatinine, Ser: 0.65 mg/dL (ref 0.61–1.24)
GFR, Estimated: >60 mL/min (ref >60)
Glucose, Bld: 152 mg/dL — ABNORMAL HIGH (ref 70–99)
Potassium: 3.5 mmol/L (ref 3.5–5.1)
Sodium: 143 mmol/L (ref 135–145)
Total Bilirubin: 0.5 mg/dL (ref 0.3–1.2)
Total Protein: 5.7 g/dL — ABNORMAL LOW (ref 6.5–8.1)

## 2021-08-11 LAB — GLUCOSE, CAPILLARY
Glucose-Capillary: 136 mg/dL — ABNORMAL HIGH (ref 70–99)
Glucose-Capillary: 112 mg/dL — ABNORMAL HIGH (ref 70–99)
Glucose-Capillary: 132 mg/dL — ABNORMAL HIGH (ref 70–99)
Glucose-Capillary: 130 mg/dL — ABNORMAL HIGH (ref 70–99)
Glucose-Capillary: 111 mg/dL — ABNORMAL HIGH (ref 70–99)

## 2021-08-11 LAB — CBC
HCT: 23.5 % — ABNORMAL LOW (ref 39.0–52.0)
Hemoglobin: 7.3 g/dL — ABNORMAL LOW (ref 13.0–17.0)
MCH: 35.1 pg — ABNORMAL HIGH (ref 26.0–34.0)
MCHC: 31.1 g/dL (ref 30.0–36.0)
MCV: 113 fL — ABNORMAL HIGH (ref 80.0–100.0)
Platelets: 254 10*3/uL (ref 150–400)
RBC: 2.08 MIL/uL — ABNORMAL LOW (ref 4.22–5.81)
RDW: 13.3 % (ref 11.5–15.5)
WBC: 5.4 10*3/uL (ref 4.0–10.5)
nRBC: 0 % (ref 0.0–0.2)

## 2021-08-11 MED ORDER — PREGABALIN 75 MG PO CAPS
200.0000 mg | ORAL_CAPSULE | Freq: Two times a day (BID) | ORAL | Status: DC
  Filled 2021-08-11: qty 2

## 2021-08-11 MED ORDER — LORAZEPAM 2 MG/ML IJ SOLN
1.0000 mg | INTRAMUSCULAR | Status: DC | PRN
  Administered 2021-08-11: 1 mg via INTRAVENOUS
  Administered 2021-08-11 – 2021-08-15 (×5): 2 mg via INTRAVENOUS
  Filled 2021-08-11 (×7): qty 1

## 2021-08-11 MED ORDER — LOPERAMIDE HCL 1 MG/7.5ML PO SUSP
2.0000 mg | Freq: Three times a day (TID) | ORAL | Status: DC
  Administered 2021-08-11 – 2021-08-14 (×11): 2 mg
  Filled 2021-08-11 (×11): qty 15

## 2021-08-11 MED ORDER — GABAPENTIN 250 MG/5ML PO SOLN
300.0000 mg | Freq: Three times a day (TID) | ORAL | Status: DC
Start: 2021-08-11 — End: 2021-08-30
  Administered 2021-08-11 – 2021-08-30 (×57): 300 mg
  Filled 2021-08-11 (×65): qty 6

## 2021-08-11 NOTE — Progress Notes (Addendum)
Pt having extreme diarrhea leaking around rectal pouch.  Contacted Dr. Freida Busman to get an order for a flexi-seal due to concern for moisture associated skin breakdown.  Dr. Freida Busman wanted to replace the rectal pouch instead of using a flexi-seal.  Started to give pt a bath, removed rectal pouch, and observed a large skin tear on pt's bottom under the pouch.  Contacted Dr. Freida Busman again to inform her of the pt's skin condition and location of the skin tear.  Dr. Freida Busman still wanted to replace the rectal pouch.  Picture taken of skin tear and placed in chart.  Dr. Freida Busman called RN back and put in orders for Flexi-seal and Imodium.  MD stated she wants the Flexi out as soon as the pt's diarrhea is better.

## 2021-08-11 NOTE — Progress Notes (Signed)
Follow up - Trauma and Critical Care  Patient Details:    [name] is an 65 y.o. male.  Anti-infectives:  Anti-infectives (From admission, onward)    Start     Dose/Rate Route Frequency Ordered Stop   08/06/21 0915  cefTRIAXone (ROCEPHIN) 2 g in sodium chloride 0.9 % 100 mL IVPB        2 g 200 mL/hr over 30 Minutes Intravenous Every 24 hours 08/06/21 0818 08/13/21 0959   07/29/21 1800  ceFAZolin (ANCEF) IVPB 2g/100 mL premix        2 g 200 mL/hr over 30 Minutes Intravenous Every 8 hours 07/29/21 1241 07/30/21 1112   07/28/21 0815  ceFAZolin (ANCEF) IVPB 2g/100 mL premix  Status:  Discontinued        2 g 200 mL/hr over 30 Minutes Intravenous On call to O.R. 07/27/21 1744 07/28/21 0900       Consults: Treatment Team:  Myrene Galas, MD   Chief Complaint/Subjective:    Overnight Issues: Diarrhea controlled with flexiseal and imodium started  Objective:  Vital signs for last 24 hours: Temp:  [98.6 F (37 C)-99.5 F (37.5 C)] 99.5 F (37.5 C) (11/23 0800) Pulse Rate:  [59-73] 59 (11/23 0800) Resp:  [16-27] 21 (11/23 0800) BP: (91-117)/(64-90) 100/73 (11/23 0800) SpO2:  [94 %-100 %] 95 % (11/23 0800) FiO2 (%):  [40 %-50 %] 40 % (11/23 0727)  Hemodynamic parameters for last 24 hours:    Intake/Output from previous day: 11/22 0701 - 11/23 0700 In: 2606.1 [I.V.:891.2; NG/GT:1575; IV Piggyback:139.9] Out: 1405 [Urine:1400; Blood:5]  Intake/Output this shift: No intake/output data recorded.  Vent settings for last 24 hours: Vent Mode: PRVC FiO2 (%):  [40 %-50 %] 40 % Set Rate:  [16 bmp] 16 bmp Vt Set:  [650 mL] 650 mL PEEP:  [5 cmH20] 5 cmH20 Plateau Pressure:  [17 cmH20-23 cmH20] 22 cmH20  Physical Exam:  Gen: sedated HEENT: trach in place Resp: assisted Cardiovascular: RRR Abdomen: soft, NT Ext: 2+ edema Neuro: follows commands   Assessment/Plan:  Fall off ladder   R PTX - CT placed in ED, CT removed 11/17 R rib fractures - aggressive  multimodal pain control, IS, pulm toilet Pelvic fx (L sacrum, R iliac, b/l inf and L sup pubic rami) with pelvic hematoma - ortho c/s, Dr. Carola Frost, SI screw 11/10 Acute hypoxic ventilator dependent respiratory failure - intubated 11/9, self-extubated and promptly re-intubated 11/12 AM. Weaned well all day yest. Extubated 11/21 but reintubated 11/21. Trach 11/22 H/o alcohol abuse - on librium, spiritus frumenti Acidosis - combined respiratory and metabolic, sent renin/aldo to r/o RTA type 4 (send out lab), empirically started fludricortisone 11/20  Volume overload - still appears extravascularly volume up, but slightly intravascularly dry based on labs. Albumin 25% x 1 COPD - prn duonebs, guaifenesin Hyperglycemia - SSI, glargine 5u daily HTN - home lisinopril held for low BP Hx of Hep C FEN: NPO, OGT, TF, +BM DVT - SCDs, LMWH Foley - replaced for retention 11/17. On Rapaflo. Foley out 11/21, replaced 11/22 PICC - placed 11/10 ID - CTX x7d for H flu in 11/16 resp cx, end 11/24 Dispo - ICU,    LOS: 15 days   Additional comments:I have discussed and reviewed with family members patient's goals for decreased sedation and weaning to trach collar over the next days  Critical Care Total Time*: 35 minutes  De Blanch Matai Carpenito 08/11/2021  *Care during the described time interval was provided by me and/or other providers  on the critical care team.  I have reviewed this patient's available data, including medical history, events of note, physical examination and test results as part of my evaluation.

## 2021-08-11 NOTE — TOC Progression Note (Signed)
Transition of Care [facility]) - Progression Note    Patient Details  Name: [name] MRN: [id number] Date of Birth: 04-Apr-1956  Transition of Care [facility]) CM/SW Contact  Glennon Mac, RN Phone Number: 08/11/2021, 1:18 PM  Clinical Narrative:    Phone call to Luz Brazen, patient's [facility] Case Manager with [employer]: updated her on patient condition.  Will fax last few days progress and nursing notes to her at [phone], per her request.    Expected Discharge Plan: IP Rehab Facility Barriers to Discharge: Continued Medical Work up  Expected Discharge Plan and Services Expected Discharge Plan: IP Rehab Facility   Discharge Planning Services: CM Consult   Living arrangements for the past 2 months: Single Family Home                                       Social Determinants of Health (SDOH) Interventions    Readmission Risk Interventions No flowsheet data found.  [name], RN, BSN  Trauma/Neuro ICU Case Manager [phone]

## 2021-08-11 NOTE — Progress Notes (Incomplete)
RN bathing patient. When replacing rectal pouch, RN observed a large skin tear under pouch. Paged MD Trauma. Dr. Freida Busman ordered flexi.

## 2021-08-12 LAB — CBC
HCT: 23.2 % — ABNORMAL LOW (ref 39.0–52.0)
Hemoglobin: 7 g/dL — ABNORMAL LOW (ref 13.0–17.0)
MCH: 33.8 pg (ref 26.0–34.0)
MCHC: 30.2 g/dL (ref 30.0–36.0)
MCV: 112.1 fL — ABNORMAL HIGH (ref 80.0–100.0)
Platelets: 271 10*3/uL (ref 150–400)
RBC: 2.07 MIL/uL — ABNORMAL LOW (ref 4.22–5.81)
RDW: 13.4 % (ref 11.5–15.5)
WBC: 5.8 10*3/uL (ref 4.0–10.5)
nRBC: 0 % (ref 0.0–0.2)

## 2021-08-12 LAB — GLUCOSE, CAPILLARY
Glucose-Capillary: 115 mg/dL — ABNORMAL HIGH (ref 70–99)
Glucose-Capillary: 118 mg/dL — ABNORMAL HIGH (ref 70–99)
Glucose-Capillary: 130 mg/dL — ABNORMAL HIGH (ref 70–99)
Glucose-Capillary: 135 mg/dL — ABNORMAL HIGH (ref 70–99)
Glucose-Capillary: 137 mg/dL — ABNORMAL HIGH (ref 70–99)
Glucose-Capillary: 137 mg/dL — ABNORMAL HIGH (ref 70–99)

## 2021-08-12 LAB — BASIC METABOLIC PANEL
Anion gap: 6 (ref 5–15)
BUN: 18 mg/dL (ref 8–23)
CO2: 25 mmol/L (ref 22–32)
Calcium: 8.2 mg/dL — ABNORMAL LOW (ref 8.9–10.3)
Chloride: 110 mmol/L (ref 98–111)
Creatinine, Ser: 0.57 mg/dL — ABNORMAL LOW (ref 0.61–1.24)
GFR, Estimated: >60 mL/min (ref >60)
Glucose, Bld: 138 mg/dL — ABNORMAL HIGH (ref 70–99)
Potassium: 3.2 mmol/L — ABNORMAL LOW (ref 3.5–5.1)
Sodium: 141 mmol/L (ref 135–145)

## 2021-08-12 MED ORDER — POTASSIUM CHLORIDE 10 MEQ/100ML IV SOLN
10.0000 meq | INTRAVENOUS | Status: AC
  Administered 2021-08-12 (×4): 10 meq via INTRAVENOUS
  Filled 2021-08-12 (×4): qty 100

## 2021-08-12 MED ORDER — FUROSEMIDE 10 MG/ML IJ SOLN
40.0000 mg | Freq: Two times a day (BID) | INTRAMUSCULAR | Status: AC
  Administered 2021-08-12 (×2): 40 mg via INTRAVENOUS
  Filled 2021-08-12 (×2): qty 4

## 2021-08-12 MED ORDER — POTASSIUM CHLORIDE 20 MEQ PO PACK
40.0000 meq | PACK | Freq: Two times a day (BID) | ORAL | Status: DC
  Administered 2021-08-12: 40 meq via ORAL
  Filled 2021-08-12 (×2): qty 2

## 2021-08-12 MED ORDER — POTASSIUM CHLORIDE 20 MEQ PO PACK
40.0000 meq | PACK | Freq: Two times a day (BID) | ORAL | Status: AC
  Administered 2021-08-12: 40 meq

## 2021-08-12 NOTE — Progress Notes (Signed)
**Note Charles-Identified via Obfuscation** Follow up - Trauma and Critical Care  Patient Details:    [name] is an 65 y.o. male.  Anti-infectives:  Anti-infectives (From admission, onward)    Start     Dose/Rate Route Frequency Ordered Stop   08/06/21 0915  cefTRIAXone (ROCEPHIN) 2 g in sodium chloride 0.9 % 100 mL IVPB        2 g 200 mL/hr over 30 Minutes Intravenous Every 24 hours 08/06/21 0818 08/13/21 0959   07/29/21 1800  ceFAZolin (ANCEF) IVPB 2g/100 mL premix        2 g 200 mL/hr over 30 Minutes Intravenous Every 8 hours 07/29/21 1241 07/30/21 1112   07/28/21 0815  ceFAZolin (ANCEF) IVPB 2g/100 mL premix  Status:  Discontinued        2 g 200 mL/hr over 30 Minutes Intravenous On call to O.R. 07/27/21 1744 07/28/21 0900       Consults: Treatment Team:  Myrene Galas, MD   Chief Complaint/Subjective:    Overnight Issues: No acute changes, able to wean precedex down  Objective:  Vital signs for last 24 hours: Temp:  [98.6 F (37 C)-100.6 F (38.1 C)] 100.6 F (38.1 C) (11/24 0800) Pulse Rate:  [59-113] 85 (11/24 0736) Resp:  [18-35] 24 (11/24 0736) BP: (99-161)/(61-95) 136/75 (11/24 0736) SpO2:  [90 %-96 %] 95 % (11/24 0736) FiO2 (%):  [40 %-50 %] 40 % (11/24 0736)  Hemodynamic parameters for last 24 hours:    Intake/Output from previous day: 11/23 0701 - 11/24 0700 In: 3158.6 [I.V.:448.6; NG/GT:2560; IV Piggyback:100] Out: 2885 [Urine:2585; Stool:300]  Intake/Output this shift: No intake/output data recorded.  Vent settings for last 24 hours: Vent Mode: PRVC FiO2 (%):  [40 %-50 %] 40 % Set Rate:  [16 bmp] 16 bmp Vt Set:  [650 mL] 650 mL PEEP:  [5 cmH20] 5 cmH20 Plateau Pressure:  [20 cmH20-28 cmH20] 20 cmH20  Physical Exam:  Gen: sedated but arousable HEENT: trach site clean Resp: assisted Cardiovascular: RRR Abdomen: soft, NT Ext: 2+ edema b/l lower extremity Neuro: GCS 6t   Assessment/Plan:   Fall off ladder   R PTX - CT placed in ED, CT removed 11/17 R rib fractures  - aggressive multimodal pain control, IS, pulm toilet Pelvic fx (L sacrum, R iliac, b/l inf and L sup pubic rami) with pelvic hematoma - ortho c/s, Dr. Carola Frost, SI screw 11/10 Acute hypoxic ventilator dependent respiratory failure - intubated 11/9, self-extubated and promptly re-intubated 11/12 AM. Weaned well all day yest. Extubated 11/21 but reintubated 11/21. Trach 11/22 H/o alcohol abuse - on librium, spiritus frumenti Acidosis - combined respiratory and metabolic, sent renin/aldo to r/o RTA type 4 (send out lab), empirically started fludricortisone 11/20  Volume overload - still appears extravascularly volume up, but slightly intravascularly dry based on labs. Albumin 25% x 1 COPD - prn duonebs, guaifenesin Hyperglycemia - SSI, glargine 5u daily Hypokalemia - replace today per protocol HTN - home lisinopril held for low BP Hx of Hep C FEN: NPO, OGT, TF, +BM DVT - SCDs, LMWH Foley - replaced for retention 11/17. On Rapaflo. Foley out 11/21, replaced 11/22, diuresis today PICC - placed 11/10 ID - CTX x7d for H flu in 11/16 resp cx, end 11/24 Dispo - ICU      LOS: 16 days   Critical Care Total Time*: 35 minutes  [name] 08/12/2021  *Care during the described time interval was provided by me and/or other providers on the critical care team.  I  have reviewed this patient's available data, including medical history, events of note, physical examination and test results as part of my evaluation.

## 2021-08-13 LAB — ALDOSTERONE + RENIN ACTIVITY W/ RATIO
ALDO / PRA Ratio: 0.5 (ref 0.0–30.0)
Aldosterone: 2.8 ng/dL (ref 0.0–30.0)
PRA LC/MS/MS: 6.056 ng/mL/hr — ABNORMAL HIGH (ref 0.167–5.380)

## 2021-08-13 LAB — BASIC METABOLIC PANEL
Anion gap: 5 (ref 5–15)
BUN: 22 mg/dL (ref 8–23)
CO2: 25 mmol/L (ref 22–32)
Calcium: 8.7 mg/dL — ABNORMAL LOW (ref 8.9–10.3)
Chloride: 112 mmol/L — ABNORMAL HIGH (ref 98–111)
Creatinine, Ser: 0.65 mg/dL (ref 0.61–1.24)
GFR, Estimated: >60 mL/min (ref >60)
Glucose, Bld: 123 mg/dL — ABNORMAL HIGH (ref 70–99)
Potassium: 3.8 mmol/L (ref 3.5–5.1)
Sodium: 142 mmol/L (ref 135–145)

## 2021-08-13 LAB — CBC
HCT: 24 % — ABNORMAL LOW (ref 39.0–52.0)
Hemoglobin: 7.2 g/dL — ABNORMAL LOW (ref 13.0–17.0)
MCH: 33.8 pg (ref 26.0–34.0)
MCHC: 30 g/dL (ref 30.0–36.0)
MCV: 112.7 fL — ABNORMAL HIGH (ref 80.0–100.0)
Platelets: 276 10*3/uL (ref 150–400)
RBC: 2.13 MIL/uL — ABNORMAL LOW (ref 4.22–5.81)
RDW: 13.5 % (ref 11.5–15.5)
WBC: 4.4 10*3/uL (ref 4.0–10.5)
nRBC: 0 % (ref 0.0–0.2)

## 2021-08-13 LAB — GLUCOSE, CAPILLARY
Glucose-Capillary: 101 mg/dL — ABNORMAL HIGH (ref 70–99)
Glucose-Capillary: 124 mg/dL — ABNORMAL HIGH (ref 70–99)
Glucose-Capillary: 131 mg/dL — ABNORMAL HIGH (ref 70–99)
Glucose-Capillary: 142 mg/dL — ABNORMAL HIGH (ref 70–99)
Glucose-Capillary: 121 mg/dL — ABNORMAL HIGH (ref 70–99)
Glucose-Capillary: 107 mg/dL — ABNORMAL HIGH (ref 70–99)

## 2021-08-13 MED ORDER — POTASSIUM CHLORIDE 20 MEQ PO PACK: 40.0000 meq | PACK | Freq: Once | ORAL | Status: DC

## 2021-08-13 MED ORDER — PRAVASTATIN SODIUM 40 MG PO TABS
40.0000 mg | ORAL_TABLET | Freq: Every day | ORAL | Status: DC
  Administered 2021-08-13 – 2021-08-30 (×18): 40 mg
  Filled 2021-08-13 (×18): qty 1

## 2021-08-13 MED ORDER — FUROSEMIDE 10 MG/ML IJ SOLN
40.0000 mg | Freq: Two times a day (BID) | INTRAMUSCULAR | Status: AC
  Administered 2021-08-13 (×2): 40 mg via INTRAVENOUS
  Filled 2021-08-13 (×2): qty 4

## 2021-08-13 MED ORDER — CHLORDIAZEPOXIDE HCL 5 MG PO CAPS
10.0000 mg | ORAL_CAPSULE | Freq: Three times a day (TID) | ORAL | Status: DC
  Administered 2021-08-13 – 2021-08-24 (×34): 10 mg
  Filled 2021-08-13 (×35): qty 2

## 2021-08-13 MED ORDER — SPIRITUS FRUMENTI
1.0000 | Freq: Every day | ORAL | Status: DC
  Administered 2021-08-14 – 2021-08-15 (×2): 1
  Filled 2021-08-13 (×3): qty 1

## 2021-08-13 MED ORDER — POTASSIUM CHLORIDE 20 MEQ PO PACK
40.0000 meq | PACK | Freq: Once | ORAL | Status: AC
  Administered 2021-08-13: 40 meq
  Filled 2021-08-13: qty 2

## 2021-08-13 NOTE — Plan of Care (Signed)
  Problem: Nutrition: Goal: Adequate nutrition will be maintained Outcome: Progressing   Problem: Pain Managment: Goal: General experience of comfort will improve Outcome: Progressing   Problem: Skin Integrity: Goal: Risk for impaired skin integrity will decrease Outcome: Progressing   Problem: Safety: Goal: Non-violent Restraint(s) Outcome: Completed/Met

## 2021-08-13 NOTE — Progress Notes (Signed)
Follow up - Trauma and Critical Care  Patient Details:    [name] is an 65 y.o. male.  Anti-infectives:  Anti-infectives (From admission, onward)    Start     Dose/Rate Route Frequency Ordered Stop   08/06/21 0915  cefTRIAXone (ROCEPHIN) 2 g in sodium chloride 0.9 % 100 mL IVPB        2 g 200 mL/hr over 30 Minutes Intravenous Every 24 hours 08/06/21 0818 08/12/21 1103   07/29/21 1800  ceFAZolin (ANCEF) IVPB 2g/100 mL premix        2 g 200 mL/hr over 30 Minutes Intravenous Every 8 hours 07/29/21 1241 07/30/21 1112   07/28/21 0815  ceFAZolin (ANCEF) IVPB 2g/100 mL premix  Status:  Discontinued        2 g 200 mL/hr over 30 Minutes Intravenous On call to O.R. 07/27/21 1744 07/28/21 0900       Consults: Treatment Team:  Myrene Galas, MD   Chief Complaint/Subjective:    Overnight Issues: 1 of precedex.  Very awake.  More cooperative  Objective:  Vital signs for last 24 hours: Temp:  [98.1 F (36.7 C)-100.6 F (38.1 C)] 98.1 F (36.7 C) (11/25 0400) Pulse Rate:  [59-103] 60 (11/25 0118) Resp:  [14-34] 14 (11/25 0118) BP: (88-140)/(46-91) 107/68 (11/24 2000) SpO2:  [91 %-97 %] 95 % (11/25 0118) FiO2 (%):  [40 %] 40 % (11/25 0118) Weight:  [121.2 kg] 121.2 kg (11/25 0410)  Hemodynamic parameters for last 24 hours:    Intake/Output from previous day: 11/24 0701 - 11/25 0700 In: 1737.3 [I.V.:328.9; NG/GT:965; IV Piggyback:443.4] Out: 2125 [Urine:2125]  Intake/Output this shift: Total I/O In: 56.6 [I.V.:56.6] Out: 400 [Urine:400]  Vent settings for last 24 hours: Vent Mode: PRVC FiO2 (%):  [40 %] 40 % Set Rate:  [16 bmp] 16 bmp Vt Set:  [650 mL] 650 mL PEEP:  [5 cmH20] 5 cmH20 Plateau Pressure:  [20 cmH20-22 cmH20] 21 cmH20  Physical Exam:  Gen: sedated but arousable HEENT: trach site clean Resp: assisted Cardiovascular: RRR Abdomen: soft, NT Ext: 2+ edema b/l lower extremity Neuro: GCS 6t   Assessment/Plan:   Fall off ladder   Volume  overload - Continue diuresis today Acute hypoxic ventilator dependent respiratory failure - intubated 11/9, self-extubated and promptly re-intubated 11/12 AM. Weaned well all day yest. Extubated 11/21 but reintubated 11/21. Trach 11/22   R PTX - CT placed in ED, CT removed 11/17 R rib fractures - aggressive multimodal pain control, IS, pulm toilet Pelvic fx (L sacrum, R iliac, b/l inf and L sup pubic rami) with pelvic hematoma - ortho c/s, Dr. Carola Frost, SI screw 11/10 H/o alcohol abuse - on librium, spiritus frumenti Acidosis - improved COPD - prn duonebs, guaifenesin Hyperglycemia - SSI, glargine 5u daily Hypokalemia - monitor and replace if needed HTN - home lisinopril held for low BP Hx of Hep C FEN: NPO, OGT, TF, +BM DVT - SCDs, LMWH Foley - replaced for retention 11/17. On Rapaflo. Foley out 11/21, replaced 11/22, diuresis today PICC - placed 11/10 ID - CTX x7d for H flu in 11/16 resp cx, end 11/24 Dispo - ICU      LOS: 17 days   Critical Care Total Time*: 32 minutes  Hyman Hopes Meredith Mells 08/13/2021  *Care during the described time interval was provided by me and/or other providers on the critical care team.  I have reviewed this patient's available data, including medical history, events of note, physical examination and test results as  part of my evaluation.

## 2021-08-14 LAB — BASIC METABOLIC PANEL
Anion gap: 6 (ref 5–15)
BUN: 21 mg/dL (ref 8–23)
CO2: 27 mmol/L (ref 22–32)
Calcium: 8.4 mg/dL — ABNORMAL LOW (ref 8.9–10.3)
Chloride: 110 mmol/L (ref 98–111)
Creatinine, Ser: 0.57 mg/dL — ABNORMAL LOW (ref 0.61–1.24)
GFR, Estimated: >60 mL/min (ref >60)
Glucose, Bld: 118 mg/dL — ABNORMAL HIGH (ref 70–99)
Potassium: 3.5 mmol/L (ref 3.5–5.1)
Sodium: 143 mmol/L (ref 135–145)

## 2021-08-14 LAB — GLUCOSE, CAPILLARY
Glucose-Capillary: 125 mg/dL — ABNORMAL HIGH (ref 70–99)
Glucose-Capillary: 124 mg/dL — ABNORMAL HIGH (ref 70–99)
Glucose-Capillary: 119 mg/dL — ABNORMAL HIGH (ref 70–99)
Glucose-Capillary: 123 mg/dL — ABNORMAL HIGH (ref 70–99)
Glucose-Capillary: 124 mg/dL — ABNORMAL HIGH (ref 70–99)

## 2021-08-14 LAB — CBC
HCT: 22.4 % — ABNORMAL LOW (ref 39.0–52.0)
Hemoglobin: 6.7 g/dL — CL (ref 13.0–17.0)
MCH: 33.7 pg (ref 26.0–34.0)
MCHC: 29.9 g/dL — ABNORMAL LOW (ref 30.0–36.0)
MCV: 112.6 fL — ABNORMAL HIGH (ref 80.0–100.0)
Platelets: 247 10*3/uL (ref 150–400)
RBC: 1.99 MIL/uL — ABNORMAL LOW (ref 4.22–5.81)
RDW: 13.3 % (ref 11.5–15.5)
WBC: 3.4 10*3/uL — ABNORMAL LOW (ref 4.0–10.5)
nRBC: 0 % (ref 0.0–0.2)

## 2021-08-14 LAB — MAGNESIUM: Magnesium: 2 mg/dL (ref 1.7–2.4)

## 2021-08-14 LAB — PREPARE RBC (CROSSMATCH)

## 2021-08-14 MED ORDER — IBUPROFEN 200 MG PO TABS
600.0000 mg | ORAL_TABLET | Freq: Four times a day (QID) | ORAL | Status: DC | PRN
  Administered 2021-08-18: 600 mg
  Filled 2021-08-14 (×2): qty 3

## 2021-08-14 MED ORDER — SODIUM CHLORIDE 0.9% IV SOLUTION: Freq: Once | INTRAVENOUS | Status: AC

## 2021-08-14 MED ORDER — POTASSIUM CHLORIDE 20 MEQ PO PACK
40.0000 meq | PACK | Freq: Once | ORAL | Status: AC
  Administered 2021-08-14: 40 meq
  Filled 2021-08-14: qty 2

## 2021-08-14 MED ORDER — FUROSEMIDE 10 MG/ML IJ SOLN
40.0000 mg | Freq: Two times a day (BID) | INTRAMUSCULAR | Status: AC
  Administered 2021-08-14 (×2): 40 mg via INTRAVENOUS
  Filled 2021-08-14 (×2): qty 4

## 2021-08-14 MED ORDER — LOPERAMIDE HCL 1 MG/7.5ML PO SUSP
2.0000 mg | Freq: Three times a day (TID) | ORAL | Status: DC | PRN
  Filled 2021-08-14: qty 15

## 2021-08-14 NOTE — Progress Notes (Signed)
Patient to continue diuresing today. Do not remove Foley per Dr. Sheliah Hatch.

## 2021-08-14 NOTE — Progress Notes (Signed)
**Note Charles-Identified via Obfuscation** Follow up - Trauma and Critical Care  Patient Details:    [name] is an 65 y.o. male.  Anti-infectives:  Anti-infectives (From admission, onward)    Start     Dose/Rate Route Frequency Ordered Stop   08/06/21 0915  cefTRIAXone (ROCEPHIN) 2 g in sodium chloride 0.9 % 100 mL IVPB        2 g 200 mL/hr over 30 Minutes Intravenous Every 24 hours 08/06/21 0818 08/12/21 1103   07/29/21 1800  ceFAZolin (ANCEF) IVPB 2g/100 mL premix        2 g 200 mL/hr over 30 Minutes Intravenous Every 8 hours 07/29/21 1241 07/30/21 1112   07/28/21 0815  ceFAZolin (ANCEF) IVPB 2g/100 mL premix  Status:  Discontinued        2 g 200 mL/hr over 30 Minutes Intravenous On call to O.R. 07/27/21 1744 07/28/21 0900       Consults: Treatment Team:  Myrene Galas, MD   Chief Complaint/Subjective:    Overnight Issues: Weaned on PS for most of day yesterday, mouthing and nodding words this morning  Objective:  Vital signs for last 24 hours: Temp:  [98.3 F (36.8 C)-100.1 F (37.8 C)] 98.4 F (36.9 C) (11/26 0400) Pulse Rate:  [62-116] 77 (11/26 0739) Resp:  [17-32] 24 (11/26 0739) BP: (95-142)/(57-95) 123/77 (11/26 0700) SpO2:  [90 %-98 %] 94 % (11/26 0739) FiO2 (%):  [40 %] 40 % (11/26 0739) Weight:  [119.6 kg] 119.6 kg (11/26 0424)  Hemodynamic parameters for last 24 hours:    Intake/Output from previous day: 11/25 0701 - 11/26 0700 In: 3488.3 [I.V.:928.3; NG/GT:2560] Out: 3655 [Urine:3655]  Intake/Output this shift: No intake/output data recorded.  Vent settings for last 24 hours: Vent Mode: PRVC FiO2 (%):  [40 %] 40 % Set Rate:  [16 bmp] 16 bmp Vt Set:  [650 mL] 650 mL PEEP:  [5 cmH20] 5 cmH20 Plateau Pressure:  [24 cmH20-28 cmH20] 26 cmH20  Physical Exam:  Gen: arousable, communicating HEENT: trahc in position, core track in position Resp: assisted Cardiovascular: RRR Abdomen: soft, NT, ND Ext: 2+ edema Neuro: GCS 11t   Assessment/Plan:   Fall off ladder    Volume overload - Continue diuresis today Acute hypoxic ventilator dependent respiratory failure - intubated 11/9, self-extubated and promptly re-intubated 11/12 AM. Weaned well all day yest. Extubated 11/21 but reintubated 11/21. Trach 11/22     R PTX - CT placed in ED, CT removed 11/17 R rib fractures - aggressive multimodal pain control, IS, pulm toilet Pelvic fx (L sacrum, R iliac, b/l inf and L sup pubic rami) with pelvic hematoma - ortho c/s, Dr. Carola Frost, SI screw 11/10 H/o alcohol abuse - on librium, spiritus frumenti Acidosis - improved COPD - prn duonebs, guaifenesin, trach placed 11/22, weaned well 11/25, hopefully intermittent TCT starting 11/26 Hyperglycemia - SSI, glargine 5u daily Hypokalemia - monitor and replace if needed Anemia - 1 unit of blood 11/26 HTN - home lisinopril held for low BP Hx of Hep C FEN: NPO, OGT, TF, +BM DVT - SCDs, LMWH Foley - replaced for retention 11/17. On Rapaflo. Foley out 11/21, replaced 11/22, diuresis today PICC - placed 11/10 ID - CTX x7d for H flu in 11/16 resp cx, end 11/24 Dispo - ICU    LOS: 18 days   Additional comments:I reviewed the patient's new clinical lab test results. Hgb 6.7 from 7.3, around 7 for last week, will transfuse for persistent anemia  Critical Care Total Time*: 35 minutes  [name] 08/14/2021  *Care during the described time interval was provided by me and/or other providers on the critical care team.  I have reviewed this patient's available data, including medical history, events of note, physical examination and test results as part of my evaluation.

## 2021-08-14 NOTE — Progress Notes (Signed)
Date and time results received: 08/14/21 0555   Test: Hemoglobin Critical Value: 6.7  Name of Provider Notified: Trauma MD Stechschulte  Orders Received? Or Actions Taken?: No new orders received. Patient asymptomatic, and MD verbalized he would intervene during rounds. Will pass along to dayshift RN.   RN will continue to monitor.  [name], RN

## 2021-08-15 LAB — CBC
HCT: 26 % — ABNORMAL LOW (ref 39.0–52.0)
Hemoglobin: 8 g/dL — ABNORMAL LOW (ref 13.0–17.0)
MCH: 32.9 pg (ref 26.0–34.0)
MCHC: 30.8 g/dL (ref 30.0–36.0)
MCV: 107 fL — ABNORMAL HIGH (ref 80.0–100.0)
Platelets: 277 10*3/uL (ref 150–400)
RBC: 2.43 MIL/uL — ABNORMAL LOW (ref 4.22–5.81)
RDW: 15.9 % — ABNORMAL HIGH (ref 11.5–15.5)
WBC: 4.2 10*3/uL (ref 4.0–10.5)
nRBC: 0 % (ref 0.0–0.2)

## 2021-08-15 LAB — GLUCOSE, CAPILLARY
Glucose-Capillary: 116 mg/dL — ABNORMAL HIGH (ref 70–99)
Glucose-Capillary: 118 mg/dL — ABNORMAL HIGH (ref 70–99)
Glucose-Capillary: 127 mg/dL — ABNORMAL HIGH (ref 70–99)
Glucose-Capillary: 129 mg/dL — ABNORMAL HIGH (ref 70–99)
Glucose-Capillary: 138 mg/dL — ABNORMAL HIGH (ref 70–99)
Glucose-Capillary: 148 mg/dL — ABNORMAL HIGH (ref 70–99)
Glucose-Capillary: 161 mg/dL — ABNORMAL HIGH (ref 70–99)
Glucose-Capillary: 98 mg/dL (ref 70–99)

## 2021-08-15 LAB — BASIC METABOLIC PANEL
Anion gap: 6 (ref 5–15)
BUN: 22 mg/dL (ref 8–23)
CO2: 27 mmol/L (ref 22–32)
Calcium: 8.3 mg/dL — ABNORMAL LOW (ref 8.9–10.3)
Chloride: 106 mmol/L (ref 98–111)
Creatinine, Ser: 0.58 mg/dL — ABNORMAL LOW (ref 0.61–1.24)
GFR, Estimated: >60 mL/min (ref >60)
Glucose, Bld: 144 mg/dL — ABNORMAL HIGH (ref 70–99)
Potassium: 3.6 mmol/L (ref 3.5–5.1)
Sodium: 139 mmol/L (ref 135–145)

## 2021-08-15 LAB — TYPE AND SCREEN
ABO/RH(D): O POS
Antibody Screen: NEGATIVE
Unit division: 0

## 2021-08-15 LAB — BPAM RBC
Blood Product Expiration Date: 202212132359
ISSUE DATE / TIME: 202211261036
Unit Type and Rh: 5100

## 2021-08-15 MED ORDER — CLONAZEPAM 0.1 MG/ML ORAL SUSPENSION: 1.0000 mg | Freq: Three times a day (TID) | ORAL | Status: DC | PRN

## 2021-08-15 MED ORDER — CLONAZEPAM 0.25 MG PO TBDP
1.0000 mg | ORAL_TABLET | Freq: Three times a day (TID) | ORAL | Status: DC | PRN
  Administered 2021-08-15 – 2021-08-18 (×8): 1 mg
  Filled 2021-08-15 (×9): qty 4

## 2021-08-15 MED ORDER — POTASSIUM CHLORIDE 20 MEQ PO PACK
40.0000 meq | PACK | Freq: Once | ORAL | Status: AC
  Administered 2021-08-15: 10:00:00 40 meq
  Filled 2021-08-15: qty 2

## 2021-08-15 MED ORDER — FUROSEMIDE 10 MG/ML IJ SOLN
40.0000 mg | Freq: Two times a day (BID) | INTRAMUSCULAR | Status: AC
  Administered 2021-08-15 (×2): 40 mg via INTRAVENOUS
  Filled 2021-08-15 (×2): qty 4

## 2021-08-15 MED ORDER — POLYETHYLENE GLYCOL 3350 17 G PO PACK
17.0000 g | PACK | Freq: Every day | ORAL | Status: DC | PRN
  Administered 2021-08-16 – 2021-08-27 (×4): 17 g
  Filled 2021-08-15 (×5): qty 1

## 2021-08-15 NOTE — Progress Notes (Signed)
Follow up - Trauma and Critical Care  Patient Details:    [name] is an 65 y.o. male.  Anti-infectives:  Anti-infectives (From admission, onward)    Start     Dose/Rate Route Frequency Ordered Stop   08/06/21 0915  cefTRIAXone (ROCEPHIN) 2 g in sodium chloride 0.9 % 100 mL IVPB        2 g 200 mL/hr over 30 Minutes Intravenous Every 24 hours 08/06/21 0818 08/12/21 1103   07/29/21 1800  ceFAZolin (ANCEF) IVPB 2g/100 mL premix        2 g 200 mL/hr over 30 Minutes Intravenous Every 8 hours 07/29/21 1241 07/30/21 1112   07/28/21 0815  ceFAZolin (ANCEF) IVPB 2g/100 mL premix  Status:  Discontinued        2 g 200 mL/hr over 30 Minutes Intravenous On call to O.R. 07/27/21 1744 07/28/21 0900       Consults: Treatment Team:  Myrene Galas, MD   Chief Complaint/Subjective:    Overnight Issues: Continue to wean ventilator.  Objective:  Vital signs for last 24 hours: Temp:  [97.7 F (36.5 C)-100 F (37.8 C)] 98.7 F (37.1 C) (11/27 0313) Pulse Rate:  [59-121] 77 (11/27 0600) Resp:  [14-34] 23 (11/27 0600) BP: (92-154)/(58-86) 123/80 (11/27 0600) SpO2:  [91 %-100 %] 95 % (11/27 0600) FiO2 (%):  [40 %] 40 % (11/27 0300) Weight:  [119.6 kg] 119.6 kg (11/27 0500)  Hemodynamic parameters for last 24 hours:    Intake/Output from previous Charles: 11/26 0701 - 11/27 0700 In: 3513.2 [I.V.:768.2; Blood:315; NG/GT:2430] Out: 3670 [Urine:3670]  Intake/Output this shift: Total I/O In: 1500.5 [I.V.:350.5; NG/GT:1150] Out: 1450 [Urine:1450]  Vent settings for last 24 hours: Vent Mode: PRVC FiO2 (%):  [40 %] 40 % Set Rate:  [16 bmp] 16 bmp Vt Set:  [650 mL] 650 mL PEEP:  [5 cmH20] 5 cmH20 Plateau Pressure:  [18 cmH20-33 cmH20] 22 cmH20  Physical Exam:  Gen: arousable, communicating HEENT: trahc in position, core track in position Resp: assisted Cardiovascular: RRR Abdomen: soft, NT, ND Ext: 2+ edema Neuro: GCS 11t   Assessment/Plan:   Fall off ladder   Volume  overload - Continue diuresis today - still 5 Liters positive on I+Os, Cr 0.58 Acute hypoxic ventilator dependent respiratory failure - intubated 11/9, self-extubated and promptly re-intubated 11/12 AM. Weaned well all Charles yest. Extubated 11/21 but reintubated 11/21. Trach 11/22 Continue to wean ventilator.  Hopefully work down to trach collar     R PTX - CT placed in ED, CT removed 11/17 R rib fractures - aggressive multimodal pain control, IS, pulm toilet Pelvic fx (L sacrum, R iliac, b/l inf and L sup pubic rami) with pelvic hematoma - ortho c/s, Dr. Carola Frost, SI screw 11/10 H/o alcohol abuse - on librium, spiritus frumenti Acidosis - improved COPD - prn duonebs, guaifenesin, trach placed 11/22, weaned well 11/25, hopefully intermittent TCT starting 11/26 Hyperglycemia - SSI, glargine 5u daily Hypokalemia - monitor and replace if needed Anemia - 1 unit of blood 11/26 HTN - home lisinopril held for low BP Hx of Hep C FEN: NPO, OGT, TF, +BM DVT - SCDs, LMWH Foley - replaced for retention 11/17. On Rapaflo. Foley out 11/21, replaced 11/22, diuresis today PICC - placed 11/10 ID - CTX x7d for H flu in 11/16 resp cx, end 11/24 Dispo - ICU    LOS: 19 days    Critical Care Total Time*: 25 minutes  Hyman Hopes Keelon Zurn 08/15/2021  *Care during the  described time interval was provided by me and/or other providers on the critical care team.  I have reviewed this patient's available data, including medical history, events of note, physical examination and test results as part of my evaluation.

## 2021-08-16 LAB — BASIC METABOLIC PANEL
Anion gap: 6 (ref 5–15)
BUN: 21 mg/dL (ref 8–23)
CO2: 28 mmol/L (ref 22–32)
Calcium: 8 mg/dL — ABNORMAL LOW (ref 8.9–10.3)
Chloride: 106 mmol/L (ref 98–111)
Creatinine, Ser: 0.5 mg/dL — ABNORMAL LOW (ref 0.61–1.24)
GFR, Estimated: >60 mL/min (ref >60)
Glucose, Bld: 104 mg/dL — ABNORMAL HIGH (ref 70–99)
Potassium: 3.5 mmol/L (ref 3.5–5.1)
Sodium: 140 mmol/L (ref 135–145)

## 2021-08-16 LAB — CBC
HCT: 24.8 % — ABNORMAL LOW (ref 39.0–52.0)
Hemoglobin: 7.9 g/dL — ABNORMAL LOW (ref 13.0–17.0)
MCH: 33.6 pg (ref 26.0–34.0)
MCHC: 31.9 g/dL (ref 30.0–36.0)
MCV: 105.5 fL — ABNORMAL HIGH (ref 80.0–100.0)
Platelets: 269 10*3/uL (ref 150–400)
RBC: 2.35 MIL/uL — ABNORMAL LOW (ref 4.22–5.81)
RDW: 14.7 % (ref 11.5–15.5)
WBC: 3.4 10*3/uL — ABNORMAL LOW (ref 4.0–10.5)
nRBC: 0 % (ref 0.0–0.2)

## 2021-08-16 LAB — GLUCOSE, CAPILLARY
Glucose-Capillary: 106 mg/dL — ABNORMAL HIGH (ref 70–99)
Glucose-Capillary: 113 mg/dL — ABNORMAL HIGH (ref 70–99)
Glucose-Capillary: 129 mg/dL — ABNORMAL HIGH (ref 70–99)
Glucose-Capillary: 156 mg/dL — ABNORMAL HIGH (ref 70–99)
Glucose-Capillary: 132 mg/dL — ABNORMAL HIGH (ref 70–99)
Glucose-Capillary: 128 mg/dL — ABNORMAL HIGH (ref 70–99)

## 2021-08-16 MED ORDER — HALOPERIDOL 5 MG PO TABS
5.0000 mg | ORAL_TABLET | Freq: Four times a day (QID) | ORAL | Status: DC | PRN
  Administered 2021-08-16: 10:00:00 5 mg via ORAL
  Filled 2021-08-16 (×2): qty 1

## 2021-08-16 MED ORDER — FUROSEMIDE 10 MG/ML IJ SOLN
40.0000 mg | Freq: Two times a day (BID) | INTRAMUSCULAR | Status: AC
  Administered 2021-08-16 (×2): 40 mg via INTRAVENOUS
  Filled 2021-08-16 (×2): qty 4

## 2021-08-16 MED ORDER — FLUDROCORTISONE ACETATE 0.1 MG PO TABS
0.0500 mg | ORAL_TABLET | Freq: Every day | ORAL | Status: DC
  Filled 2021-08-16: qty 0.5

## 2021-08-16 MED ORDER — HALOPERIDOL LACTATE 5 MG/ML IJ SOLN
5.0000 mg | Freq: Four times a day (QID) | INTRAMUSCULAR | Status: DC | PRN
  Administered 2021-08-16 – 2021-08-29 (×7): 5 mg via INTRAVENOUS
  Filled 2021-08-16 (×8): qty 1

## 2021-08-16 MED ORDER — HALOPERIDOL LACTATE 5 MG/ML IJ SOLN: 5.0000 mg | Freq: Four times a day (QID) | INTRAMUSCULAR | Status: DC | PRN

## 2021-08-16 MED ORDER — FLUDROCORTISONE ACETATE 0.1 MG PO TABS
0.1000 mg | ORAL_TABLET | Freq: Every day | ORAL | Status: AC
  Administered 2021-08-17 – 2021-08-19 (×3): 0.1 mg
  Filled 2021-08-16 (×3): qty 1

## 2021-08-16 NOTE — Progress Notes (Signed)
Trauma/Critical Care Follow Up Note  Subjective:    Overnight Issues:   Objective:  Vital signs for last 24 hours: Temp:  [98.1 F (36.7 C)-99.8 F (37.7 C)] 99.8 F (37.7 C) (11/28 1156) Pulse Rate:  [59-109] 94 (11/28 1100) Resp:  [17-33] 22 (11/28 1100) BP: (92-150)/(63-111) 116/78 (11/28 1100) SpO2:  [87 %-99 %] 93 % (11/28 1111) FiO2 (%):  [40 %-50 %] 50 % (11/28 1111)  Hemodynamic parameters for last 24 hours:    Intake/Output from previous day: 11/27 0701 - 11/28 0700 In: 2471.5 [I.V.:726.5; NG/GT:1745] Out: 3980 [Urine:3980]  Intake/Output this shift: Total I/O In: 539.1 [I.V.:99.1; NG/GT:440] Out: 2200 [Urine:2200]  Vent settings for last 24 hours: Vent Mode: PRVC FiO2 (%):  [40 %-50 %] 50 % Set Rate:  [16 bmp] 16 bmp Vt Set:  [650 mL] 650 mL PEEP:  [5 cmH20] 5 cmH20 Pressure Support:  [10 cmH20] 10 cmH20 Plateau Pressure:  [18 cmH20-28 cmH20] 19 cmH20  Physical Exam:  Gen: comfortable, no distress Neuro: non-focal exam HEENT: PERRL Neck: supple CV: RRR Pulm: unlabored breathing Abd: soft, NT GU: clear yellow urine Extr: wwp, no edema   Results for orders placed or performed during the hospital encounter of 07/27/21 (from the past 24 hour(s))  Glucose, capillary     Status: Abnormal   Collection Time: 08/15/21  3:36 PM  Result Value Ref Range   Glucose-Capillary 161 (H) 70 - 99 mg/dL  Glucose, capillary     Status: Abnormal   Collection Time: 08/15/21  7:26 PM  Result Value Ref Range   Glucose-Capillary 138 (H) 70 - 99 mg/dL  Glucose, capillary     Status: Abnormal   Collection Time: 08/15/21 11:04 PM  Result Value Ref Range   Glucose-Capillary 127 (H) 70 - 99 mg/dL  Glucose, capillary     Status: Abnormal   Collection Time: 08/16/21  3:27 AM  Result Value Ref Range   Glucose-Capillary 106 (H) 70 - 99 mg/dL  Basic metabolic panel     Status: Abnormal   Collection Time: 08/16/21  5:09 AM  Result Value Ref Range   Sodium 140 135 - 145  mmol/L   Potassium 3.5 3.5 - 5.1 mmol/L   Chloride 106 98 - 111 mmol/L   CO2 28 22 - 32 mmol/L   Glucose, Bld 104 (H) 70 - 99 mg/dL   BUN 21 8 - 23 mg/dL   Creatinine, Ser 5.88 (L) 0.61 - 1.24 mg/dL   Calcium 8.0 (L) 8.9 - 10.3 mg/dL   GFR, Estimated >50 >27 mL/min   Anion gap 6 5 - 15  CBC     Status: Abnormal   Collection Time: 08/16/21  5:09 AM  Result Value Ref Range   WBC 3.4 (L) 4.0 - 10.5 K/uL   RBC 2.35 (L) 4.22 - 5.81 MIL/uL   Hemoglobin 7.9 (L) 13.0 - 17.0 g/dL   HCT 74.1 (L) 28.7 - 86.7 %   MCV 105.5 (H) 80.0 - 100.0 fL   MCH 33.6 26.0 - 34.0 pg   MCHC 31.9 30.0 - 36.0 g/dL   RDW 67.2 09.4 - 70.9 %   Platelets 269 150 - 400 K/uL   nRBC 0.0 0.0 - 0.2 %  Glucose, capillary     Status: Abnormal   Collection Time: 08/16/21  7:16 AM  Result Value Ref Range   Glucose-Capillary 113 (H) 70 - 99 mg/dL  Glucose, capillary     Status: Abnormal   Collection Time: 08/16/21 11:18  AM  Result Value Ref Range   Glucose-Capillary 129 (H) 70 - 99 mg/dL    Assessment & Plan: The plan of care was discussed with the bedside nurse for the day, Minerva Areola, who is in agreement with this plan and no additional concerns were raised.   Present on Admission:  Fall    LOS: 20 days   Additional comments:I reviewed the patient's new clinical lab test results.   and I reviewed the patients new imaging test results.    Fall off ladder   Volume overload - continue diuresis Acute hypoxic ventilator dependent respiratory failure - intubated 11/9, self-extubated and promptly re-intubated 11/12 AM. Extubated and reintubated 11/21. Trach 11/22. Tol PSV, but limited TC trials, continue.  Agitation/delirium - barrier to TC trials, on precedex, librium and seroquel scheduled, prn clonax, added prn haldol today, consider scheduled zyprexa instead of sero? May need to engage psych for assistance.  R PTX - resolved R rib fractures - aggressive multimodal pain control, IS, pulm toilet Pelvic fx (L sacrum, R  iliac, b/l inf and L sup pubic rami) with pelvic hematoma - ortho c/s, Dr. Carola Frost, SI screw 11/10 H/o alcohol abuse - on librium, d/c spiritus frumenti Acidosis - improved. Wean fludrocortisone, aldo levels normal.  COPD - prn duonebs, guaifenesin, trach 11/22 Hyperglycemia - SSI, glargine 5u  Hypokalemia - monitor and replace if needed Anemia - stable, monitor HTN - home lisinopril held for low BP Hx of Hep C FEN: NPO, OGT, TF, +BM DVT - SCDs, LMWH Foley - replaced for retention 11/17, 11/22. ToV today PICC - placed 11/10 ID - CTX x7d for H flu in 11/16 resp cx, end 11/24 Dispo - ICU   Critical Care Total Time: 45 minutes  Diamantina Monks, MD Trauma & General Surgery Please use [email or web address] to contact on call provider  08/16/2021  *Care during the described time interval was provided by me. I have reviewed this patient's available data, including medical history, events of note, physical examination and test results as part of my evaluation.

## 2021-08-16 NOTE — Progress Notes (Signed)
PT Cancellation Note  Patient Details Name: [name] MRN: [id number] DOB: May 27, 1956   Cancelled Treatment:    Reason Eval/Treat Not Completed: Medical issues which prohibited therapy (respiratory status not stable). Will check back tomorrow.   Lyanne Co, PT  Acute Rehab Services  Pager [phone] Office [phone]    Lawana Chambers Jawon Dipiero 08/16/2021, 1:52 PM

## 2021-08-16 NOTE — Progress Notes (Signed)
OT Cancellation Note  Patient Details Name: INDALECIO MALMSTROM MRN: [id number] DOB: 07-23-1956   Cancelled Treatment:    Reason Eval/Treat Not Completed: Medical issues which prohibited therapy (Per nsg pt tachypneic. Given dilaudid now lethargic and not able to participate. Will attempt later time.)  [facility] Karrine Kluttz, OT/L   Acute OT Clinical Specialist Acute Rehabilitation Services Pager [phone] Office [phone]  08/16/2021, 12:13 PM

## 2021-08-16 NOTE — Progress Notes (Signed)
SLP Cancellation Note  Patient Details Name: CHAE OOMMEN III MRN: [id number] DOB: Jan 31, 1956   Cancelled treatment:        Pt was placed on trach collar earlier this morning lasting 45 min. When checked on this afternoon he was on vent and sleeping. Updated wife who agrees with holding off this afternoon. Will plan to check on tomorrow.    [name] 08/16/2021, 2:37 PM  [name] Lonell Face.Ed Nurse, children's [phone] Office [phone]

## 2021-08-17 ENCOUNTER — Inpatient Hospital Stay: Payer: Self-pay

## 2021-08-17 LAB — BASIC METABOLIC PANEL
Anion gap: 7 (ref 5–15)
BUN: 17 mg/dL (ref 8–23)
CO2: 30 mmol/L (ref 22–32)
Calcium: 8.3 mg/dL — ABNORMAL LOW (ref 8.9–10.3)
Chloride: 105 mmol/L (ref 98–111)
Creatinine, Ser: 0.61 mg/dL (ref 0.61–1.24)
GFR, Estimated: >60 mL/min (ref >60)
Glucose, Bld: 110 mg/dL — ABNORMAL HIGH (ref 70–99)
Potassium: 3.5 mmol/L (ref 3.5–5.1)
Sodium: 142 mmol/L (ref 135–145)

## 2021-08-17 LAB — CBC
HCT: 26.2 % — ABNORMAL LOW (ref 39.0–52.0)
Hemoglobin: 8.2 g/dL — ABNORMAL LOW (ref 13.0–17.0)
MCH: 32.9 pg (ref 26.0–34.0)
MCHC: 31.3 g/dL (ref 30.0–36.0)
MCV: 105.2 fL — ABNORMAL HIGH (ref 80.0–100.0)
Platelets: 293 10*3/uL (ref 150–400)
RBC: 2.49 MIL/uL — ABNORMAL LOW (ref 4.22–5.81)
RDW: 14.2 % (ref 11.5–15.5)
WBC: 3.9 10*3/uL — ABNORMAL LOW (ref 4.0–10.5)
nRBC: 0 % (ref 0.0–0.2)

## 2021-08-17 LAB — GLUCOSE, CAPILLARY
Glucose-Capillary: 112 mg/dL — ABNORMAL HIGH (ref 70–99)
Glucose-Capillary: 140 mg/dL — ABNORMAL HIGH (ref 70–99)
Glucose-Capillary: 109 mg/dL — ABNORMAL HIGH (ref 70–99)
Glucose-Capillary: 136 mg/dL — ABNORMAL HIGH (ref 70–99)
Glucose-Capillary: 101 mg/dL — ABNORMAL HIGH (ref 70–99)
Glucose-Capillary: 151 mg/dL — ABNORMAL HIGH (ref 70–99)

## 2021-08-17 MED ORDER — MAGNESIUM HYDROXIDE 400 MG/5ML PO SUSP
30.0000 mL | Freq: Once | ORAL | Status: AC
  Administered 2021-08-17: 30 mL

## 2021-08-17 MED ORDER — ASPIRIN 81 MG PO CHEW
81.0000 mg | CHEWABLE_TABLET | Freq: Every day | ORAL | Status: DC
  Administered 2021-08-17 – 2021-08-30 (×14): 81 mg
  Filled 2021-08-17 (×13): qty 1

## 2021-08-17 MED ORDER — MAGNESIUM HYDROXIDE 400 MG/5ML PO SUSP
ORAL | Status: AC
  Filled 2021-08-17: qty 30

## 2021-08-17 MED ORDER — GLYCOPYRROLATE 1 MG PO TABS
2.0000 mg | ORAL_TABLET | Freq: Two times a day (BID) | ORAL | Status: DC
Start: 2021-08-17 — End: 2021-08-22
  Administered 2021-08-17 – 2021-08-22 (×11): 2 mg
  Filled 2021-08-17 (×13): qty 2

## 2021-08-17 MED ORDER — POTASSIUM CHLORIDE 20 MEQ PO PACK
40.0000 meq | PACK | Freq: Once | ORAL | Status: AC
  Administered 2021-08-17: 40 meq

## 2021-08-17 MED ORDER — ASPIRIN 81 MG PO CHEW
CHEWABLE_TABLET | ORAL | Status: AC
  Filled 2021-08-17: qty 1

## 2021-08-17 MED ORDER — POTASSIUM CHLORIDE 20 MEQ PO PACK
PACK | ORAL | Status: AC
  Filled 2021-08-17: qty 2

## 2021-08-17 NOTE — Evaluation (Signed)
Passy-Muir Speaking Valve - Evaluation Patient Details  Name: [name] MRN: [id number] Date of Birth: [birth year]  Today's Date: 08/17/2021 Time: 1152-1212 SLP Time Calculation (min) (ACUTE ONLY): 20 min  Past Medical History:  Past Medical History:  Diagnosis Date   COPD (chronic obstructive pulmonary disease) (HCC)    GERD (gastroesophageal reflux disease)    History of hepatitis C    treated several years ago through [facility]   Hypertension    Past Surgical History:  Past Surgical History:  Procedure Laterality Date   CATARACT EXTRACTION, BILATERAL     EYE SURGERY Bilateral    ORIF PELVIC FRACTURE WITH PERCUTANEOUS SCREWS Right 07/29/2021   Procedure: SI SCREW FIXATION OF RIGHT PELVIC RING;  Surgeon: Myrene Galas, MD;  Location: [facility] OR;  Service: Orthopedics;  Laterality: Right;   ORTHOPEDIC SURGERY Bilateral    pt states fracture repairs to arms and legs.   REVERSE SHOULDER ARTHROPLASTY Left 09/23/2019   Procedure: REVERSE SHOULDER ARTHROPLASTY;  Surgeon: Jones Broom, MD;  Location: [facility] ORS;  Service: Orthopedics;  Laterality: Left;   TRACHEOSTOMY TUBE PLACEMENT N/A 08/10/2021   Procedure: TRACHEOSTOMY;  Surgeon: Violeta Gelinas, MD;  Location: [facility] OR;  Service: General;  Laterality: N/A;   HPI:  65 yo M with PMHx significant for COPD, HTN, GERD and Hep C presenting to [facility] after falling off of a ladder from 6 ft and complaining of severe chest pain and difficulty breathing with O2 sats in the 70s. CT revealed atrophy with small vessel chronic ischemic changes of deep cerebral white matter but no acute intracranial abnormalities.    Assessment / Plan / Recommendation  Clinical Impression  Pt was seen with RT for an inline PMV evaluation. RT deep suctioned retrieving copious secretions, reduced PEEP to 0 and deflated cuff, noting a sufficient drop in expiratory volume. No changes in sats (HR 77, SpO2 91-92, RR 24). SLP donned PMV, noting no sat changes. Pt successfully  redirected air through the upper airway and attempted vocalizations, achieving low intensity, hoarse, and wet voicing with intelligibility ~10%. Daughter present, able to read lips and describe speech content to therapists. Pt stated name, birthday, and location independently. Pt attempted speaking quickly, benefitted from cues to increase volume and speak in one word utterances, however required these cues for each utterance. Pt became fatigued and SLP doffed speaking valve with RT returning vent settings to baseline. Recommend PMV use with full SLP supervision only. SLP will continue to follow pt for therapy targeting PMV tolerance. SLP Visit Diagnosis: Aphonia (R49.1)    SLP Assessment  Patient needs continued Speech Lanaguage Pathology Services    Recommendations for follow up therapy are one component of a multi-disciplinary discharge planning process, led by the attending physician.  Recommendations may be updated based on patient status, additional functional criteria and insurance authorization.  Follow Up Recommendations  Acute inpatient rehab (3hours/day)    Assistance Recommended at Discharge PRN  Functional Status Assessment Patient has had a recent decline in their functional status and demonstrates the ability to make significant improvements in function in a reasonable and predictable amount of time.  Frequency and Duration min 2x/week  2 weeks    PMSV Trial PMSV was placed for: 39m Able to redirect subglottic air through upper airway: Yes Able to Attain Phonation: Yes Voice Quality: Hoarse;Wet;Low vocal intensity Able to Expectorate Secretions: Yes Level of Secretion Expectoration with PMSV: Not observed Breath Support for Phonation: Severely decreased Intelligibility: Intelligibility reduced Word: 0-24% accurate Phrase: 0-24% accurate  Sentence: 0-24% accurate Conversation: 0-24% accurate Respirations During Trial: 24 SpO2 During Trial: 92 % Pulse During Trial:  77 Behavior: Alert;Anxious;Controlled   Tracheostomy Tube       Vent Dependency  Vent Mode: PRVC Set Rate: 16 bmp PEEP: 5 cmH20 FiO2 (%): 40 % Vt Set: 650 mL    Cuff Deflation Trial Tolerated Cuff Deflation: Yes Length of Time for Cuff Deflation Trial: 4m Behavior: Anxious;Alert;Controlled    GO  Jeannie Done, SLP-Student          Jeannie Done 08/17/2021, 1:32 PM

## 2021-08-17 NOTE — Progress Notes (Signed)
Inpatient Rehab Admissions Coordinator:   Per therapy recommendations, patient was screened for CIR candidacy by Megan Salon, MS, CCC-SLP. At this time, Pt. Has not yet attempted OOB and it is not clear if he is able to tolerate intensity of CIR; however,  Pt. may have potential to progress to becoming a potential CIR candidate, so CIR admissions team will follow and monitor for progress and participation with therapies and place consult order if Pt. appears to be an appropriate candidate. Please contact me with any questions.   Megan Salon, MS, CCC-SLP Rehab Admissions Coordinator  [phone] (celll) [phone] (office)

## 2021-08-17 NOTE — Progress Notes (Signed)
Nutrition Follow-up  DOCUMENTATION CODES:   Not applicable  INTERVENTION:   Tube feeding via Cortrak: Jevity 1.5 at 65 ml/h (1560 ml per day) Prosource TF 90 ml BID  Provides 2500 kcal, 143 gm protein, 1185 ml free water daily  Free water: 250 ml every 6 hours Total free water: 2185 ml    NUTRITION DIAGNOSIS:   Increased nutrient needs related to post-op healing as evidenced by estimated needs. Ongoing.   GOAL:   Patient will meet greater than or equal to 90% of their needs Met.   MONITOR:   TF tolerance  REASON FOR ASSESSMENT:   Consult, Ventilator Enteral/tube feeding initiation and management  ASSESSMENT:   Pt with PMH of ETOH abuse, COPD, HTN, GERD, and hepatitis C admitted after fall from ladder with R PTX s/p CT, R rib fxs, and pelvic fx s/p fixation.   Pt discussed during ICU rounds and with RN.  Per RN pt only tolerated TC x 30 minutes. Case manager looking into [facility].   11/10 s/p screw fixation of pelvic ring  11/12 self-extubated and re-intubated 11/18 cortrak placed; tip gastric  11/21 extubated; re-intubated 11/22 trach today  Patient is currently on ventilator support Temp (24hrs), Avg:98.8 F (37.1 C), Min:97.3 F (36.3 C), Max:99.8 F (37.7 C)  Medications reviewed and include: vitamin D3, folic acid, SSI, Semglee, MVI with minerals, thiamine  Precedex   Labs reviewed: Vitamin D: 21.5 CBG's: 109-140   Diet Order:   Diet Order             Diet NPO time specified  Diet effective now                   EDUCATION NEEDS:   Not appropriate for education at this time  Skin:  Skin Assessment:  (R hip and neck incisions, skin tear buttocks)  Last BM:  11/25, rectal tube removed; no bm x 4 days  Height:   Ht Readings from Last 1 Encounters:  07/27/21 _0  (1.88 m)    Weight:   Wt Readings from Last 1 Encounters:  08/17/21 119.8 kg    BMI:  Body mass index is 33.91 kg/m.  Estimated Nutritional Needs:   Kcal:   2400-2600  Protein:  135-145 grams  Fluid:  > 2 L/day  Lockie Pares., RD, LDN, CNSC See AMiON for contact information

## 2021-08-17 NOTE — Progress Notes (Signed)
Patient placed back on full support on vent. Patient desating on ATC and increased work of breathing. RN at bedside. RT will monitor.

## 2021-08-17 NOTE — Progress Notes (Signed)
Trach sutures removed per MD. New trach ties secured. 

## 2021-08-17 NOTE — Evaluation (Signed)
Occupational Therapy Evaluation Patient Details Name: [name] MRN: [id number] DOB: 29-Mar-1956 Today's Date: 08/17/2021   History of Present Illness 65 y/o male presented to ED on 11/8 after fall off 8 foot ladder. CT chest found small R apical pneumothorax, multiple displaced posterior R rib fxs, nondisplaced R iliac wing fx with widening of R SI joint, displaced fx both inferior pubic rami and L superior pubic ramus, and nondisplaced L sacral fx, SI screw 07/29/21. PMH: COPD, EtOH abuse, HTN, GERD, hep C   Clinical Impression   Pt seen with PT services this date to maximize functional outcomes and safety during evaluation; dtr at bedside and supportive. Assisting with pt's attempt at mouthing words. Pt expressing concerns during session of ' being this way forever'. Overall pt with excellent willingness and effort for participation, demo's grossly decreased strength to B Ue's L>R with hx of Reverse TSA, edema present to all limbs and scrotum, limitations in activity tolerance and balance. Pt consistently requiring max +2 for functional transfers and ADLs completed this date. Pt's baseline is indep with all ADLs and mobility and working full time, living alone but with excellent family and friend support that is anticipated to be 24hr S/A if needed. Pt is an excellent candidate for post acute therapy at IPR level to maximize indep and safety, with OT to continue to follow acutely.      Recommendations for follow up therapy are one component of a multi-disciplinary discharge planning process, led by the attending physician.  Recommendations may be updated based on patient status, additional functional criteria and insurance authorization.   Follow Up Recommendations  Acute inpatient rehab (3hours/day)    Assistance Recommended at Discharge Frequent or constant Supervision/Assistance  Functional Status Assessment  Patient has had a recent decline in their functional status and  demonstrates the ability to make significant improvements in function in a reasonable and predictable amount of time.  Equipment Recommendations  Other (comment) (TBD at next LOC)    Recommendations for Other Services Rehab consult     Precautions / Restrictions Precautions Precautions: Fall Precaution Comments: fell from ladder at work Restrictions Weight Bearing Restrictions: Yes RLE Weight Bearing: Weight bearing as tolerated LLE Weight Bearing: Weight bearing as tolerated Other Position/Activity Restrictions: dtr reports visual deficits at baseline, uses magnifying glass for near/upclose      Mobility Bed Mobility Overal bed mobility: Needs Assistance Bed Mobility: Rolling;Sidelying to Sit;Sit to Supine Rolling: +2 for physical assistance;Max assist Sidelying to sit: +2 for physical assistance;Max assist   Sit to supine: Total assist;+2 for physical assistance   General bed mobility comments: hand over hand to achieve full reaching across body for rail support toa ssist with rolling. max A +2 for control of trunk and LEs off of bed to achieve full transition to stitting upright. good indep effort for weight shifting to advance hips to EOB but continues max +2 for anterior scooting to EOB.    Transfers                   General transfer comment: unable      Balance Overall balance assessment: Needs assistance Sitting-balance support: Bilateral upper extremity supported;Feet supported Sitting balance-Leahy Scale: Poor Sitting balance - Comments: requiring mod A to maintain upright posture sitting EOB, tendenc for L lean/mild posterior Postural control: Posterior lean;Left lateral lean  ADL either performed or assessed with clinical judgement   ADL Overall ADL's : Needs assistance/impaired Eating/Feeding: Total assistance;Bed level Eating/Feeding Details (indicate cue type and reason): NG Grooming: Moderate  assistance;Bed level           Upper Body Dressing : Maximal assistance;Total assistance;Sitting Upper Body Dressing Details (indicate cue type and reason): management of gown. limited removal of UE from EOB for trunk support leading to heavy physical assist for balance and management of gown Lower Body Dressing: Total assistance;Bed level Lower Body Dressing Details (indicate cue type and reason): simulated bed level               General ADL Comments: pt currently demo's need for extensive A for all ADLs, hand to face capability present but limited by edema and generalized weakness. improved with HOb elevated, limited tolerance for participation with ADLs from seated EOB positioning     Vision Baseline Vision/History: 1 Wears glasses Patient Visual Report: Other (comment) (use of magnifying glass for reading)       Perception     Praxis      Pertinent Vitals/Pain Pain Assessment: Faces Faces Pain Scale: Hurts a little bit 2  Pain Location: generalized with mobility and scrotum Pain Descriptors / Indicators: Discomfort Pain Intervention(s): Limited activity within patient's tolerance     Hand Dominance Right   Extremity/Trunk Assessment Upper Extremity Assessment Upper Extremity Assessment: Generalized weakness;LUE deficits/detail LUE Deficits / Details: L shoulder reverse TSA with L shoulder flexion limited. grossly limited to B UE's d/t edema. R UE appears generally WFL.   Lower Extremity Assessment Lower Extremity Assessment: Generalized weakness;RLE deficits/detail RLE Deficits / Details: able to move R ankle and straighten R knee, not further tested due to pain RLE: Unable to fully assess due to pain RLE Sensation: WNL RLE Coordination: decreased gross motor   Cervical / Trunk Assessment Cervical / Trunk Assessment: Kyphotic   Communication Communication Communication: Tracheostomy   Cognition Arousal/Alertness: Awake/alert Behavior During Therapy: WFL  for tasks assessed/performed Overall Cognitive Status: Difficult to assess                                 General Comments: follows simple commands and mouthing words     General Comments  VSS throughout session while on vent.    Exercises     Shoulder Instructions      Home Living Family/patient expects to be discharged to:: Private residence Living Arrangements: Alone Available Help at Discharge: Family;Available PRN/intermittently Type of Home: House Home Access: Stairs to enter [employer] of Steps: 3 Entrance Stairs-Rails: Right;Left;Can reach both Home Layout: One level     Bathroom Shower/Tub: Producer, television/film/video: Standard     Home Equipment: None   Additional Comments: sister lives next door and could help and girlfriend lives nearby as well. Daughter in [place] could take him to her house as well      Prior Functioning/Environment Prior Level of Function : Independent/Modified Independent;Working/employed;Driving                        OT Problem List: Decreased strength;Decreased activity tolerance;Decreased range of motion;Impaired balance (sitting and/or standing);Decreased coordination;Decreased safety awareness;Decreased knowledge of use of DME or AE;Decreased knowledge of precautions;Pain      OT Treatment/Interventions: Self-care/ADL training;Therapeutic exercise;Neuromuscular education;Energy conservation;DME and/or AE instruction;Therapeutic activities;Patient/family education;Balance training    OT Goals(Current goals can be found  in the care plan section) Acute Rehab OT Goals Patient Stated Goal: to walk again OT Goal Formulation: With patient Time For Goal Achievement: 08/31/21 Potential to Achieve Goals: Good ADL Goals Pt Will Perform Grooming: with set-up;sitting Pt Will Perform Upper Body Bathing: with min assist;sitting Additional ADL Goal #1: pt will tolerate sitting with SBA ~10 minutes  at EOB for increased indep with seated self care tasks Additional ADL Goal #2: pt will complete bed mobility including both rolling and supine<>sitting with min A in prep for participation with self care tasks  OT Frequency: Min 3X/week   Barriers to D/C:            Co-evaluation PT/OT/SLP Co-Evaluation/Treatment: Yes Reason for Co-Treatment: Complexity of the patient's impairments (multi-system involvement);For patient/therapist safety PT goals addressed during session: Mobility/safety with mobility;Balance OT goals addressed during session: ADL's and self-care      AM-PAC OT "6 Clicks" Daily Activity     Outcome Measure Help from another person eating meals?: Total Help from another person taking care of personal grooming?: A Lot Help from another person toileting, which includes using toliet, bedpan, or urinal?: Total Help from another person bathing (including washing, rinsing, drying)?: Total Help from another person to put on and taking off regular upper body clothing?: A Lot Help from another person to put on and taking off regular lower body clothing?: Total 6 Click Score: 8   End of Session Equipment Utilized During Treatment: Oxygen Nurse Communication: Mobility status;Precautions  Activity Tolerance: Patient limited by fatigue Patient left: in bed;with bed alarm set;with call bell/phone within reach;with family/visitor present  OT Visit Diagnosis: Muscle weakness (generalized) (M62.81)                Time: 9563-8756 OT Time Calculation (min): 42 min Charges:  OT General Charges $OT Visit: 1 Visit OT Evaluation $OT Eval High Complexity: 1 High  Fawaz Borquez OTR/L acute rehab services Office: [phone]  08/17/2021, 12:34 PM

## 2021-08-17 NOTE — Progress Notes (Signed)
This RN spoke with Trauma MD [name] about removal of triple lumen PICC line instead of exchange for double lumen. Was given verbal permission over the phone to place order for PICC line removal if IV team was able to establish two peripheral access IV's.   IV consult placed Two peripherals inserted Order for exchange + line care discontinued PICC line removal order placed  [name], RN

## 2021-08-17 NOTE — Progress Notes (Signed)
Patient ID: Katherine Basset III, male   DOB: 1955/11/12, 65 y.o.   MRN: [id number] Follow up - Trauma Critical Care  Patient Details:    DUB MACLELLAN III is an 65 y.o. male.  Lines/tubes : PICC Triple Lumen 22/63/33 PICC Right Basilic 43 cm 0 cm (Active)  Indication for Insertion or Continuance of Line Prolonged intravenous therapies 08/16/21 2000  Exposed Catheter (cm) 0 cm 07/29/21 1635  Site Assessment Clean;Dry;Intact 08/16/21 2000  Lumen #1 Status Flushed;Infusing 08/16/21 2000  Lumen #2 Status In-line blood sampling system in place 08/16/21 2000  Lumen #3 Status Saline locked;Flushed 08/16/21 2000  Dressing Type Transparent 08/16/21 2000  Dressing Status Clean;Dry;Intact 08/16/21 2000  Antimicrobial disc in place? Yes 08/16/21 2000  Safety Lock Not Applicable 54/56/25 6389  Line Care Connections checked and tightened 08/16/21 2000  Dressing Intervention New dressing;Dressing changed;Antimicrobial disc changed;Securement device changed 08/13/21 1619  Dressing Change Due 08/20/21 08/16/21 2000     External Urinary Catheter (Active)    Microbiology/Sepsis markers: Results for orders placed or performed during the hospital encounter of 07/27/21  Resp Panel by RT-PCR (Flu A&B, Covid) Nasopharyngeal Swab     Status: None   Collection Time: 07/27/21  3:34 PM   Specimen: Nasopharyngeal Swab; Nasopharyngeal(NP) swabs in vial transport medium  Result Value Ref Range Status   SARS Coronavirus 2 by RT PCR NEGATIVE NEGATIVE Final    Comment: (NOTE) SARS-CoV-2 target nucleic acids are NOT DETECTED.  The SARS-CoV-2 RNA is generally detectable in upper respiratory specimens during the acute phase of infection. The lowest concentration of SARS-CoV-2 viral copies this assay can detect is 138 copies/mL. A negative result does not preclude SARS-Cov-2 infection and should not be used as the sole basis for treatment or other patient management decisions. A negative result may occur with   improper specimen collection/handling, submission of specimen other than nasopharyngeal swab, presence of viral mutation(s) within the areas targeted by this assay, and inadequate number of viral copies(<138 copies/mL). A negative result must be combined with clinical observations, patient history, and epidemiological information. The expected result is Negative.  Fact Sheet for Patients:  EntrepreneurPulse.com.au  Fact Sheet for Healthcare Providers:  IncredibleEmployment.be  This test is no t yet approved or cleared by the [place] FDA and  has been authorized for detection and/or diagnosis of SARS-CoV-2 by FDA under an Emergency Use Authorization (EUA). This EUA will remain  in effect (meaning this test can be used) for the duration of the COVID-19 declaration under Section 564(b)(1) of the Act, 21 U.S.C.section 360bbb-3(b)(1), unless the authorization is terminated  or revoked sooner.       Influenza A by PCR NEGATIVE NEGATIVE Final   Influenza B by PCR NEGATIVE NEGATIVE Final    Comment: (NOTE) The Xpert Xpress SARS-CoV-2/FLU/RSV plus assay is intended as an aid in the diagnosis of influenza from Nasopharyngeal swab specimens and should not be used as a sole basis for treatment. Nasal washings and aspirates are unacceptable for Xpert Xpress SARS-CoV-2/FLU/RSV testing.  Fact Sheet for Patients: EntrepreneurPulse.com.au  Fact Sheet for Healthcare Providers: IncredibleEmployment.be  This test is not yet approved or cleared by the [place] FDA and has been authorized for detection and/or diagnosis of SARS-CoV-2 by FDA under an Emergency Use Authorization (EUA). This EUA will remain in effect (meaning this test can be used) for the duration of the COVID-19 declaration under Section 564(b)(1) of the Act, 21 U.S.C. section 360bbb-3(b)(1), unless the authorization is terminated  or revoked.  Performed at [facility], [place] [address], [place], [place] [address]   Surgical PCR screen     Status: None   Collection Time: 07/27/21  5:54 PM   Specimen: Nasal Mucosa; Nasal Swab  Result Value Ref Range Status   MRSA, PCR NEGATIVE NEGATIVE Final   Staphylococcus aureus NEGATIVE NEGATIVE Final    Comment: (NOTE) The Xpert SA Assay (FDA approved for NASAL specimens in patients 64 years of age and older), is one component of a comprehensive surveillance program. It is not intended to diagnose infection nor to guide or monitor treatment. Performed at [facility], [place] [address] [place], [place] [address]   Culture, Respiratory w Gram Stain     Status: None   Collection Time: 08/04/21 11:53 AM   Specimen: Tracheal Aspirate; Respiratory  Result Value Ref Range Status   Specimen Description TRACHEAL ASPIRATE  Final   Special Requests NONE  Final   Gram Stain   Final    FEW SQUAMOUS EPITHELIAL CELLS PRESENT FEW WBC SEEN FEW GRAM POSITIVE COCCI MODERATE GRAM NEGATIVE RODS    Culture   Final    ABUNDANT HAEMOPHILUS INFLUENZAE BETA LACTAMASE NEGATIVE Performed at [facility], [place] [address], [place], [place] [address]    Report Status 08/05/2021 FINAL  Final    Anti-infectives:  Anti-infectives (From admission, onward)    Start     Dose/Rate Route Frequency Ordered Stop   08/06/21 0915  cefTRIAXone (ROCEPHIN) 2 g in sodium chloride 0.9 % 100 mL IVPB        2 g 200 mL/hr over 30 Minutes Intravenous Every 24 hours 08/06/21 0818 08/12/21 1103   07/29/21 1800  ceFAZolin (ANCEF) IVPB 2g/100 mL premix        2 g 200 mL/hr over 30 Minutes Intravenous Every 8 hours 07/29/21 1241 07/30/21 1112   07/28/21 0815  ceFAZolin (ANCEF) IVPB 2g/100 mL premix  Status:  Discontinued        2 g 200 mL/hr over 30 Minutes Intravenous On call to O.R. 07/27/21 1744 07/28/21 0900       Best Practice/Protocols:  VTE Prophylaxis: Lovenox (prophylaxtic  dose) Intermittent Sedation  Consults: Treatment Team:  Altamese Franklin, MD    Studies:    Events:  Subjective:    Overnight Issues:   Objective:  Vital signs for last 24 hours: Temp:  [98.6 F (37 C)-99.8 F (37.7 C)] 99.3 F (37.4 C) (11/29 0800) Pulse Rate:  [68-107] 86 (11/29 1000) Resp:  [16-33] 19 (11/29 1000) BP: (96-174)/(65-135) 108/93 (11/29 1000) SpO2:  [91 %-99 %] 96 % (11/29 1000) FiO2 (%):  [40 %-60 %] 40 % (11/29 0837) Weight:  [119.8 kg] 119.8 kg (11/29 0500)  Hemodynamic parameters for last 24 hours:    Intake/Output from previous day: 11/28 0701 - 11/29 0700 In: 2624.9 [I.V.:579.9; NG/GT:2045] Out: 5475 [Urine:5475]  Intake/Output this shift: Total I/O In: 97.4 [I.V.:97.4] Out: -   Vent settings for last 24 hours: Vent Mode: PRVC FiO2 (%):  [40 %-60 %] 40 % Set Rate:  [16 bmp] 16 bmp Vt Set:  [650 mL] 650 mL PEEP:  [5 cmH20] 5 cmH20 Plateau Pressure:  [19 cmH20-28 cmH20] 19 cmH20  Physical Exam:  General: on vent Neuro: alert and F/C and mouths words HEENT/Neck: trach-clean, intact Resp: clear to auscultation bilaterally CVS: RRR GI: mild dist but soft, NT Extremities: edema 1+  Results for orders placed or performed during the hospital encounter of 07/27/21 (from the  past 24 hour(s))  Glucose, capillary     Status: Abnormal   Collection Time: 08/16/21  3:35 PM  Result Value Ref Range   Glucose-Capillary 156 (H) 70 - 99 mg/dL  Glucose, capillary     Status: Abnormal   Collection Time: 08/16/21  7:11 PM  Result Value Ref Range   Glucose-Capillary 132 (H) 70 - 99 mg/dL  Glucose, capillary     Status: Abnormal   Collection Time: 08/16/21 11:19 PM  Result Value Ref Range   Glucose-Capillary 128 (H) 70 - 99 mg/dL  Glucose, capillary     Status: Abnormal   Collection Time: 08/17/21  3:17 AM  Result Value Ref Range   Glucose-Capillary 112 (H) 70 - 99 mg/dL  Basic metabolic panel     Status: Abnormal   Collection Time: 08/17/21   5:39 AM  Result Value Ref Range   Sodium 142 135 - 145 mmol/L   Potassium 3.5 3.5 - 5.1 mmol/L   Chloride 105 98 - 111 mmol/L   CO2 30 22 - 32 mmol/L   Glucose, Bld 110 (H) 70 - 99 mg/dL   BUN 17 8 - 23 mg/dL   Creatinine, Ser 0.61 0.61 - 1.24 mg/dL   Calcium 8.3 (L) 8.9 - 10.3 mg/dL   GFR, Estimated >60 >60 mL/min   Anion gap 7 5 - 15  CBC     Status: Abnormal   Collection Time: 08/17/21  5:39 AM  Result Value Ref Range   WBC 3.9 (L) 4.0 - 10.5 K/uL   RBC 2.49 (L) 4.22 - 5.81 MIL/uL   Hemoglobin 8.2 (L) 13.0 - 17.0 g/dL   HCT 26.2 (L) 39.0 - 52.0 %   MCV 105.2 (H) 80.0 - 100.0 fL   MCH 32.9 26.0 - 34.0 pg   MCHC 31.3 30.0 - 36.0 g/dL   RDW 14.2 11.5 - 15.5 %   Platelets 293 150 - 400 K/uL   nRBC 0.0 0.0 - 0.2 %    Assessment & Plan: Present on Admission:  Fall    LOS: 21 days   Additional comments:I reviewed the patient's new clinical lab test results. . Fall off ladder   Volume overload - continue diuresis Acute hypoxic ventilator dependent respiratory failure - intubated 11/9, self-extubated and promptly re-intubated 11/12 AM. Extubated and reintubated 11/21. Trach 11/22. Did HTC 12mn this AM due to secretions. Add scheduled Robinul. Agitation/delirium - barrier to TC trials, on precedex, librium and seroquel scheduled, prn clonax, prn haldol today, consider scheduled zyprexa instead of sero? May need to engage psych for assistance.  R PTX - resolved R rib fractures - aggressive multimodal pain control, IS, pulm toilet Pelvic fx (L sacrum, R iliac, b/l inf and L sup pubic rami) with pelvic hematoma - ortho c/s, Dr. HMarcelino Scot SI screw 11/10 H/o alcohol abuse - on librium, D/Cd spiritus frumenti Acidosis - improved. Wean fludrocortisone, aldo levels normal.  COPD - prn duonebs, guaifenesin, trach 11/22, see above Hyperglycemia - SSI, glargine 5u  Hypokalemia - monitor and replace if needed Anemia - stable, monitor HTN - home lisinopril held for low BP Hx of Hep  C FEN: NPO, OGT, TF, +BM DVT - SCDs, LMWH Foley - replaced for retention 11/17, 11/22. ToV again today (needed I&O once already) PICC - placed 11/10 ID - CTX x7d for H flu in 11/16 resp cx, end 11/24 Dispo - ICU  I met with his daughter at the bedside and updated her on his progress. Critical Care Total Time*:  Williams, MD, MPH, FACS Trauma & General Surgery Use [email or web address] to contact on call provider  08/17/2021  *Care during the described time interval was provided by me. I have reviewed this patient's available data, including medical history, events of note, physical examination and test results as part of my evaluation.

## 2021-08-17 NOTE — Evaluation (Signed)
Physical Therapy Evaluation Patient Details Name: [name] MRN: [id number] DOB: 1956-07-23 Today's Date: 08/17/2021  History of Present Illness  65 y/o male presented to ED on 11/8 after fall off 8 foot ladder. CT chest found small R apical pneumothorax, multiple displaced posterior R rib fxs, nondisplaced R iliac wing fx with widening of R SI joint, displaced fx both inferior pubic rami and L superior pubic ramus, and nondisplaced L sacral fx, SI screw 07/29/21. PMH: COPD, EtOH abuse, HTN, GERD, hep C  Clinical Impression  Pt admitted with above diagnosis. Pt somewhat anxious beginning of session, had been working on speaking with SLP and was relaying to daughter his fears of his functional status not improving. Pt seen by PT/OT to initiate mobility. +2 max A needed to roll and come to EOB. Pt maintained sitting with mod A, expressed discomfort R hip and scrotum as well as generalized. Pt's vitals remained stable with mobility. Pt very motivated to return to PLOF, recommend intense rehab environment for him to achieve this. His daughter states that he could d/c to her home and have 24/7 supervision if needed.  Pt currently with functional limitations due to the deficits listed below (see PT Problem List). Pt will benefit from skilled PT to increase their independence and safety with mobility to allow discharge to the venue listed below.          Recommendations for follow up therapy are one component of a multi-disciplinary discharge planning process, led by the attending physician.  Recommendations may be updated based on patient status, additional functional criteria and insurance authorization.  Follow Up Recommendations Acute inpatient rehab (3hours/day)    Assistance Recommended at Discharge Frequent or constant Supervision/Assistance  Functional Status Assessment Patient has had a recent decline in their functional status and demonstrates the ability to make significant improvements  in function in a reasonable and predictable amount of time.  Equipment Recommendations  Other (comment) (TBD)    Recommendations for Other Services Rehab consult     Precautions / Restrictions Precautions Precautions: Fall Precaution Comments: fell from ladder at work Restrictions Weight Bearing Restrictions: Yes RLE Weight Bearing: Weight bearing as tolerated LLE Weight Bearing: Weight bearing as tolerated Other Position/Activity Restrictions: has some visual deficits at baseline, uses magnifying glass to see up close      Mobility  Bed Mobility Overal bed mobility: Needs Assistance Bed Mobility: Rolling;Sidelying to Sit;Sit to Supine Rolling: +2 for physical assistance;Max assist Sidelying to sit: +2 for physical assistance;Max assist   Sit to supine: Total assist;+2 for physical assistance   General bed mobility comments: pt reaching for L rail with R hand when cued. Max A +2 needed lower body and upper body. Pt attempting to assist with SL to sit but max A +2 needed to complete task and wt shift to R. Tot A +2 to return to supine in part due to lines    Transfers                   General transfer comment: unable    Ambulation/Gait               General Gait Details: unable  Stairs            Wheelchair Mobility    Modified Rankin (Stroke Patients Only)       Balance Overall balance assessment: Needs assistance Sitting-balance support: Bilateral upper extremity supported;Feet supported Sitting balance-Leahy Scale: Poor Sitting balance - Comments: needed mod A to  maintain sitting EOB                                     Pertinent Vitals/Pain Pain Assessment: Faces Faces Pain Scale: Hurts a little bit Facial Expression: Relaxed, neutral Body Movements: Absence of movements Muscle Tension: Relaxed Compliance with ventilator (intubated pts.): Tolerating ventilator or movement Vocalization (extubated pts.): N/A CPOT  Total: 0 Pain Location: generalized with mobility and scrotum Pain Descriptors / Indicators: Discomfort Pain Intervention(s): Limited activity within patient's tolerance;Monitored during session    Home Living Family/patient expects to be discharged to:: Private residence Living Arrangements: Alone Available Help at Discharge: Family;Available PRN/intermittently Type of Home: House Home Access: Stairs to enter Entrance Stairs-Rails: Right;Left;Can reach both Entrance Stairs-Number of Steps: 3   Home Layout: One level Home Equipment: None Additional Comments: sister lives next door and could help and girlfriend lives nearby as well. Daughter in [place] could take him to her house as well    Prior Function Prior Level of Function : Independent/Modified Independent;Working/employed;Driving                     Hand Dominance   Dominant Hand: Right    Extremity/Trunk Assessment   Upper Extremity Assessment Upper Extremity Assessment: Defer to OT evaluation    Lower Extremity Assessment Lower Extremity Assessment: Generalized weakness;RLE deficits/detail RLE Deficits / Details: able to move R ankle and straighten R knee, not further tested due to pain RLE: Unable to fully assess due to pain RLE Sensation: WNL RLE Coordination: decreased gross motor    Cervical / Trunk Assessment Cervical / Trunk Assessment: Kyphotic  Communication   Communication: Tracheostomy  Cognition Arousal/Alertness: Awake/alert Behavior During Therapy: WFL for tasks assessed/performed Overall Cognitive Status: Difficult to assess                                 General Comments: follows simple commands and mouthing words Functional Status Assessment: Patient has had a recent decline in their functional status and demonstrates the ability to make significant improvements in function in a reasonable and predictable amount of time.      General Comments General comments (skin  integrity, edema, etc.): VSS on vent with trach. Daughter present and able to lip read what he is saying pretty well. Pt worried about remaining in current state    Exercises     Assessment/Plan    PT Assessment Patient needs continued PT services  PT Problem List Decreased strength;Decreased range of motion;Decreased activity tolerance;Decreased balance;Decreased mobility;Decreased coordination;Decreased cognition;Decreased knowledge of use of DME;Decreased safety awareness;Decreased knowledge of precautions;Cardiopulmonary status limiting activity;Pain;Obesity       PT Treatment Interventions DME instruction;Gait training;Functional mobility training;Therapeutic activities;Therapeutic exercise;Balance training;Neuromuscular re-education;Cognitive remediation;Patient/family education    PT Goals (Current goals can be found in the Care Plan section)  Acute Rehab PT Goals Patient Stated Goal: return home PT Goal Formulation: With patient Time For Goal Achievement: 08/31/21 Potential to Achieve Goals: Good    Frequency Min 5X/week   Barriers to discharge        Co-evaluation PT/OT/SLP Co-Evaluation/Treatment: Yes Reason for Co-Treatment: Complexity of the patient's impairments (multi-system involvement);Necessary to address cognition/behavior during functional activity;For patient/therapist safety PT goals addressed during session: Mobility/safety with mobility;Balance         AM-PAC PT "6 Clicks" Mobility  Outcome Measure Help needed turning from  your back to your side while in a flat bed without using bedrails?: Total Help needed moving from lying on your back to sitting on the side of a flat bed without using bedrails?: Total Help needed moving to and from a bed to a chair (including a wheelchair)?: Total Help needed standing up from a chair using your arms (e.g., wheelchair or bedside chair)?: Total Help needed to walk in hospital room?: Total Help needed climbing 3-5  steps with a railing? : Total 6 Click Score: 6    End of Session Equipment Utilized During Treatment: Oxygen Activity Tolerance: Patient limited by fatigue Patient left: in bed;with call bell/phone within reach;with family/visitor present Nurse Communication: Mobility status PT Visit Diagnosis: Muscle weakness (generalized) (M62.81);Pain;Unsteadiness on feet (R26.81) Pain - Right/Left: Right Pain - part of body: Hip    Time: 8875-7972 PT Time Calculation (min) (ACUTE ONLY): 45 min   Charges:   PT Evaluation $PT Eval Moderate Complexity: 1 Mod PT Treatments $Therapeutic Activity: 8-22 mins        Lyanne Co, PT  Acute Rehab Services  Pager [phone] Office [phone]   Lawana Chambers Dorthula Bier 08/17/2021, 2:36 PM

## 2021-08-17 NOTE — Plan of Care (Signed)
  Problem: Activity: Goal: Risk for activity intolerance will decrease Outcome: Progressing   Problem: Nutrition: Goal: Adequate nutrition will be maintained Outcome: Progressing   Problem: Elimination: Goal: Will not experience complications related to urinary retention Outcome: Not Progressing   

## 2021-08-18 ENCOUNTER — Inpatient Hospital Stay (HOSPITAL_COMMUNITY): Payer: BC Managed Care – PPO

## 2021-08-18 DIAGNOSIS — F064 Anxiety disorder due to known physiological condition: Secondary | ICD-10-CM

## 2021-08-18 LAB — BASIC METABOLIC PANEL
Anion gap: 6 (ref 5–15)
Anion gap: 6 (ref 5–15)
BUN: 16 mg/dL (ref 8–23)
BUN: 16 mg/dL (ref 8–23)
CO2: 28 mmol/L (ref 22–32)
CO2: 31 mmol/L (ref 22–32)
Calcium: 8.6 mg/dL — ABNORMAL LOW (ref 8.9–10.3)
Calcium: 8.7 mg/dL — ABNORMAL LOW (ref 8.9–10.3)
Chloride: 103 mmol/L (ref 98–111)
Chloride: 104 mmol/L (ref 98–111)
Creatinine, Ser: 0.59 mg/dL — ABNORMAL LOW (ref 0.61–1.24)
Creatinine, Ser: 0.62 mg/dL (ref 0.61–1.24)
GFR, Estimated: >60 mL/min (ref >60)
GFR, Estimated: >60 mL/min (ref >60)
Glucose, Bld: 136 mg/dL — ABNORMAL HIGH (ref 70–99)
Glucose, Bld: 126 mg/dL — ABNORMAL HIGH (ref 70–99)
Potassium: 3.5 mmol/L (ref 3.5–5.1)
Potassium: 3.8 mmol/L (ref 3.5–5.1)
Sodium: 137 mmol/L (ref 135–145)
Sodium: 141 mmol/L (ref 135–145)

## 2021-08-18 LAB — GLUCOSE, CAPILLARY
Glucose-Capillary: 123 mg/dL — ABNORMAL HIGH (ref 70–99)
Glucose-Capillary: 102 mg/dL — ABNORMAL HIGH (ref 70–99)
Glucose-Capillary: 121 mg/dL — ABNORMAL HIGH (ref 70–99)
Glucose-Capillary: 130 mg/dL — ABNORMAL HIGH (ref 70–99)
Glucose-Capillary: 119 mg/dL — ABNORMAL HIGH (ref 70–99)
Glucose-Capillary: 158 mg/dL — ABNORMAL HIGH (ref 70–99)

## 2021-08-18 LAB — CBC
HCT: 27.8 % — ABNORMAL LOW (ref 39.0–52.0)
Hemoglobin: 8.8 g/dL — ABNORMAL LOW (ref 13.0–17.0)
MCH: 32.8 pg (ref 26.0–34.0)
MCHC: 31.7 g/dL (ref 30.0–36.0)
MCV: 103.7 fL — ABNORMAL HIGH (ref 80.0–100.0)
Platelets: 308 10*3/uL (ref 150–400)
RBC: 2.68 MIL/uL — ABNORMAL LOW (ref 4.22–5.81)
RDW: 13.9 % (ref 11.5–15.5)
WBC: 4.8 10*3/uL (ref 4.0–10.5)
nRBC: 0 % (ref 0.0–0.2)

## 2021-08-18 MED ORDER — HYDROXYZINE HCL 25 MG PO TABS
25.0000 mg | ORAL_TABLET | Freq: Four times a day (QID) | ORAL | Status: DC | PRN
  Administered 2021-08-18 – 2021-08-28 (×14): 25 mg
  Filled 2021-08-18 (×19): qty 1

## 2021-08-18 MED ORDER — FUROSEMIDE 10 MG/ML IJ SOLN
60.0000 mg | Freq: Four times a day (QID) | INTRAMUSCULAR | Status: AC
  Administered 2021-08-18 (×3): 60 mg via INTRAVENOUS
  Filled 2021-08-18 (×3): qty 6

## 2021-08-18 MED ORDER — MELATONIN 3 MG PO TABS
3.0000 mg | ORAL_TABLET | Freq: Every day | ORAL | Status: DC
  Administered 2021-08-18 – 2021-08-29 (×12): 3 mg
  Filled 2021-08-18 (×14): qty 1

## 2021-08-18 MED ORDER — POTASSIUM CHLORIDE 20 MEQ PO PACK
40.0000 meq | PACK | Freq: Two times a day (BID) | ORAL | Status: AC
  Administered 2021-08-18 (×2): 40 meq
  Filled 2021-08-18 (×2): qty 2

## 2021-08-18 MED ORDER — SODIUM BICARBONATE 650 MG PO TABS
650.0000 mg | ORAL_TABLET | Freq: Once | ORAL | Status: DC
  Filled 2021-08-18: qty 1

## 2021-08-18 MED ORDER — PANCRELIPASE (LIP-PROT-AMYL) 10440-39150 UNITS PO TABS
20880.0000 [IU] | ORAL_TABLET | Freq: Once | ORAL | Status: DC
  Filled 2021-08-18: qty 2

## 2021-08-18 NOTE — Progress Notes (Addendum)
Trauma/Critical Care Follow Up Note  Subjective:    Overnight Issues:   Objective:  Vital signs for last 24 hours: Temp:  [97.3 F (36.3 C)-100.4 F (38 C)] 100.4 F (38 C) (11/30 0400) Pulse Rate:  [78-114] 88 (11/30 0730) Resp:  [14-44] 24 (11/30 0730) BP: (108-174)/(72-136) 140/88 (11/30 0730) SpO2:  [91 %-99 %] 94 % (11/30 0730) FiO2 (%):  [40 %] 40 % (11/30 0730) Weight:  [119.8 kg] 119.8 kg (11/30 0347)  Hemodynamic parameters for last 24 hours:    Intake/Output from previous day: 11/29 0701 - 11/30 0700 In: 3656.2 [I.V.:411.2; NG/GT:3245] Out: 3300 [Urine:3300]  Intake/Output this shift: No intake/output data recorded.  Vent settings for last 24 hours: Vent Mode: PRVC FiO2 (%):  [40 %] 40 % Set Rate:  [16 bmp] 16 bmp Vt Set:  [650 mL] 650 mL PEEP:  [5 cmH20] 5 cmH20 Plateau Pressure:  [16 cmH20-26 cmH20] 22 cmH20  Physical Exam:  Gen: comfortable, no distress Neuro: non-focal exam HEENT: PERRL Neck: supple CV: RRR Pulm: unlabored breathing Abd: soft, NT GU: clear yellow urine Extr: wwp, no edema   Results for orders placed or performed during the hospital encounter of 07/27/21 (from the past 24 hour(s))  Glucose, capillary     Status: Abnormal   Collection Time: 08/17/21 12:17 PM  Result Value Ref Range   Glucose-Capillary 109 (H) 70 - 99 mg/dL  Glucose, capillary     Status: Abnormal   Collection Time: 08/17/21  3:37 PM  Result Value Ref Range   Glucose-Capillary 136 (H) 70 - 99 mg/dL  Glucose, capillary     Status: Abnormal   Collection Time: 08/17/21  7:12 PM  Result Value Ref Range   Glucose-Capillary 101 (H) 70 - 99 mg/dL  Glucose, capillary     Status: Abnormal   Collection Time: 08/17/21 11:07 PM  Result Value Ref Range   Glucose-Capillary 151 (H) 70 - 99 mg/dL  Basic metabolic panel     Status: Abnormal   Collection Time: 08/18/21  2:30 AM  Result Value Ref Range   Sodium 137 135 - 145 mmol/L   Potassium 3.5 3.5 - 5.1 mmol/L    Chloride 103 98 - 111 mmol/L   CO2 28 22 - 32 mmol/L   Glucose, Bld 136 (H) 70 - 99 mg/dL   BUN 16 8 - 23 mg/dL   Creatinine, Ser 3.08 (L) 0.61 - 1.24 mg/dL   Calcium 8.6 (L) 8.9 - 10.3 mg/dL   GFR, Estimated >65 >78 mL/min   Anion gap 6 5 - 15  CBC     Status: Abnormal   Collection Time: 08/18/21  2:30 AM  Result Value Ref Range   WBC 4.8 4.0 - 10.5 K/uL   RBC 2.68 (L) 4.22 - 5.81 MIL/uL   Hemoglobin 8.8 (L) 13.0 - 17.0 g/dL   HCT 46.9 (L) 62.9 - 52.8 %   MCV 103.7 (H) 80.0 - 100.0 fL   MCH 32.8 26.0 - 34.0 pg   MCHC 31.7 30.0 - 36.0 g/dL   RDW 41.3 24.4 - 01.0 %   Platelets 308 150 - 400 K/uL   nRBC 0.0 0.0 - 0.2 %  Glucose, capillary     Status: Abnormal   Collection Time: 08/18/21  3:15 AM  Result Value Ref Range   Glucose-Capillary 123 (H) 70 - 99 mg/dL  Glucose, capillary     Status: Abnormal   Collection Time: 08/18/21  7:49 AM  Result Value Ref Range  Glucose-Capillary 102 (H) 70 - 99 mg/dL    Assessment & Plan: The plan of care was discussed with the bedside nurse for the day, Chase, who is in agreement with this plan and no additional concerns were raised.   Present on Admission:  Fall    LOS: 22 days   Additional comments:I reviewed the patient's new clinical lab test results.   and I reviewed the patients new imaging test results.    Fall off ladder   Volume overload - continue diuresis Acute hypoxic ventilator dependent respiratory failure - intubated 11/9, self-extubated and promptly re-intubated 11/12 AM. Extubated and reintubated 11/21. Trach 11/22. Poor weaning/TC. On guaifenesis/robinul Agitation/delirium - barrier to TC trials, on precedex, librium and seroquel scheduled, prn clonaz, prn haldol today, engage psych for assistance with med mgmt today.  R PTX - resolved R rib fractures - aggressive multimodal pain control, IS, pulm toilet Pelvic fx (L sacrum, R iliac, b/l inf and L sup pubic rami) with pelvic hematoma - ortho c/s, Dr. Carola Frost, SI screw  11/10 H/o alcohol abuse - on librium, D/Cd spiritus frumenti 11/28 Acidosis - improved. Wean fludrocortisone, aldo levels normal. Taper ordered COPD - prn duonebs, guaifenesin, trach 11/22, see above Hyperglycemia - SSI, glargine 5u  Hypokalemia - monitor and replace if needed Anemia - stable, monitor HTN - home lisinopril held for low BP Hx of Hep C FEN: NPO, OGT, TF, +BM DVT - SCDs, LMWH Foley - replaced for retention 11/17, 11/22, 11/29. PICC - removed ID - CTX x7d for H flu in 11/16 resp cx, end 11/24; repeat resp cx/CXR sent today in case subclincal PNA is again a contributor Dispo - ICU, recommend dispo planning to LTAC due to difficulties with vent weaning and anticipating prolonged vent wean. If able to get off the vent, would be a candidate for CIR.  Critical Care Total Time: 50 minutes  Diamantina Monks, MD Trauma & General Surgery Please use [email or web address] to contact on call provider  08/18/2021  *Care during the described time interval was provided by me. I have reviewed this patient's available data, including medical history, events of note, physical examination and test results as part of my evaluation.

## 2021-08-18 NOTE — Plan of Care (Signed)
  Problem: Clinical Measurements: Goal: Respiratory complications will improve Outcome: Progressing   Problem: Activity: Goal: Risk for activity intolerance will decrease Outcome: Progressing   Problem: Coping: Goal: Level of anxiety will decrease Outcome: Progressing   

## 2021-08-18 NOTE — Progress Notes (Signed)
Occupational Therapy Treatment Patient Details Name: [name] MRN: [id number] DOB: 1956-08-11 Today's Date: 08/18/2021   History of present illness 65 y/o male presented to ED on 11/8 after fall off 8 foot ladder. CT chest found small R apical pneumothorax, multiple displaced posterior R rib fxs, nondisplaced R iliac wing fx with widening of R SI joint, displaced fx both inferior pubic rami and L superior pubic ramus, and nondisplaced L sacral fx, SI screw 07/29/21. PMH: COPD, EtOH abuse, HTN, GERD, hep C   OT comments  Pt demonstrates increased activity tolerance at EOB and initiating core activation with sitting. Pt benefits from visual cues to reach toward footboard. Pt sitting on L EOB due to vent trach at this time. Pt noted to have need deep suction prior to session. Pt noted to be NWB R LE 8 weeks per order and will be L LE WBAT only.  Recommendation remains CIR.   Recommendations for follow up therapy are one component of a multi-disciplinary discharge planning process, led by the attending physician.  Recommendations may be updated based on patient status, additional functional criteria and insurance authorization.    Follow Up Recommendations  Acute inpatient rehab (3hours/day)    Assistance Recommended at Discharge Frequent or constant Supervision/Assistance  Equipment Recommendations  Other (comment) w/c cushion    Recommendations for Other Services Rehab consult    Precautions / Restrictions Precautions Precautions: Fall Precaution Comments: foley, vent trach Restrictions Weight Bearing Restrictions: Yes RLE Weight Bearing: Non weight bearing (x8 weeks) LLE Weight Bearing: Weight bearing as tolerated       Mobility Bed Mobility Overal bed mobility: Needs Assistance Bed Mobility: Sit to Supine;Supine to Sit   Sidelying to sit:  (+3 with HOB elevated helicopter) Supine to sit: HOB elevated;+2 for safety/equipment;+2 for physical assistance;Max assist (+3 (A)  to pivot helicopter to EOB) Sit to supine: +2 for safety/equipment;+2 for physical assistance;Total assist   General bed mobility comments: pt progressed to EOB with extensive help for lines / leads/ vent with RN deep suctioning just prior. Pt    Transfers                   General transfer comment: not appropriate yet     Balance Overall balance assessment: Needs assistance Sitting-balance support: Bilateral upper extremity supported;Feet supported Sitting balance-Leahy Scale: Poor Sitting balance - Comments: strong L lateral lean. Pt reaching for foot board with R UE and activates core to shift weight. pt working hard to come to midline. pt holdin breath and benefits from vent support during these attempts Postural control: Left lateral lean;Posterior lean                                 ADL either performed or assessed with clinical judgement   ADL Overall ADL's : Needs assistance/impaired Eating/Feeding: NPO Eating/Feeding Details (indicate cue type and reason): trach vent currently inflated                                   General ADL Comments: Session focused on EOB sitting this session. pt noted to have scrotal edema and elevation on pad provided to help with edema management.    Extremity/Trunk Assessment Upper Extremity Assessment Upper Extremity Assessment: Generalized weakness LUE Deficits / Details: L UE weakness compared to R UE. Family reports hx of shoulder surgery  Vision   Additional Comments: Pt tracking in all visual fields. pt correctly response to visual questions   Perception     Praxis      Cognition Arousal/Alertness: Awake/alert Behavior During Therapy: WFL for tasks assessed/performed Overall Cognitive Status: Difficult to assess                                 General Comments: pt telling therapist 2 to indicate needing increased assist. Daughter [name] present and reports "he  thinks you are me" daughter coming into visual field and pt acknowledged willingness to continue.          Exercises     Shoulder Instructions       General Comments VSS, 40% FIO2 PEEP 5 on vent trach at this time. Pt noted to have secretions with deep suction from RN prior to sitting up. risk for sacral wound and will need weight shifts for skin integrity    Pertinent Vitals/ Pain       Pain Assessment: Faces Facial Expression: Grimacing Body Movements: Protection Muscle Tension: Relaxed Compliance with ventilator (intubated pts.): N/A Vocalization (extubated pts.): Talking in normal tone or no sound CPOT Total: 3  Home Living                                          Prior Functioning/Environment              Frequency  Min 3X/week        Progress Toward Goals  OT Goals(current goals can now be found in the care plan section)  Progress towards OT goals: Progressing toward goals  Acute Rehab OT Goals Patient Stated Goal: to sit up OT Goal Formulation: With patient Time For Goal Achievement: 08/31/21 Potential to Achieve Goals: Good ADL Goals Pt Will Perform Grooming: with set-up;sitting Pt Will Perform Upper Body Bathing: with min assist;sitting Additional ADL Goal #1: pt will tolerate sitting with SBA ~10 minutes at EOB for increased indep with seated self care tasks Additional ADL Goal #2: pt will complete bed mobility including both rolling and supine<>sitting with min A in prep for participation with self care tasks  Plan Discharge plan remains appropriate    Co-evaluation    PT/OT/SLP Co-Evaluation/Treatment: Yes Reason for Co-Treatment: Complexity of the patient's impairments (multi-system involvement);Necessary to address cognition/behavior during functional activity;For patient/therapist safety;To address functional/ADL transfers   OT goals addressed during session: ADL's and self-care;Proper use of Adaptive equipment and  DME;Strengthening/ROM      AM-PAC OT "6 Clicks" Daily Activity     Outcome Measure   Help from another person eating meals?: A Lot Help from another person taking care of personal grooming?: A Lot Help from another person toileting, which includes using toliet, bedpan, or urinal?: Total Help from another person bathing (including washing, rinsing, drying)?: Total Help from another person to put on and taking off regular upper body clothing?: A Lot Help from another person to put on and taking off regular lower body clothing?: Total 6 Click Score: 9    End of Session Equipment Utilized During Treatment: Oxygen  OT Visit Diagnosis: Muscle weakness (generalized) (M62.81)   Activity Tolerance Patient tolerated treatment well   Patient Left in bed;with call bell/phone within reach;with bed alarm set;with family/visitor present;with SCD's reapplied   Nurse Communication Mobility status;Precautions;Need for  lift equipment        Time: 1101-1127 OT Time Calculation (min): 26 min  Charges: OT General Charges $OT Visit: 1 Visit OT Treatments $Self Care/Home Management : 8-22 mins   Brynn, OTR/L  Acute Rehabilitation Services Pager: [phone] Office: [phone] .  Mateo Flow 08/18/2021, 1:26 PM

## 2021-08-18 NOTE — Consult Note (Signed)
Brief Psychiatry Consult Note  Attempted to see pt today; in brief pt with h/o alcohol use disorder (and possibly undiagnosed depression/anxiety) who has been hospitalized for a prolonged period of time after a fall from a ladder. Has had difficulty weaning vent, significant anxiety with trach collar trials (some of which is provoked by copious amount of frothy secretions per primary team). Documentation by primary team and nursing staff has been reviewed.    Saw patient with daughter at bedside shortly after TCT. Pt unable to participate meaningfully in exam; was able to follow some simple commands (squeeze finger, etc) an smiled briefly, but unable to participate in SAVEAHAART, nod/shake head in response to questions, etc. Per daughter was much more alert, interactive, etc until tired out by TCT. Has had waxing and waning of consciousness - will sometimes be alert, oriented, ask her questions about herself and brother, etc - these periods have lasted for up to several hours. The biggest change she saw was when fentanyl was weaned several days ago. Pt with minimal formal psych hx per daughter (self-medicated with EtOH), did see someone ~10 years ago for ?depression but didn't find a med he liked. Have reviewed pt's chart and making following recs:  Today: Meds START hydroxyzine 25 mg up to QID for (if partially effective, increase to 50 mg) - highly antihistaminergic w/o significant anticholinergic properties, secretions reportedly driving difficulty with TCT  - give before TCT and as first line for anxiety before klonopin CHANGE melatonin 3 mg from PRN to QHS  CONTINUE Librium 10 TID Klonopin 1 TID PRN  Gabapentin 300 q8 Haldol 5 q6 PRN  Quetiapine 200 mg BID  Labs/workup folate, B12 (still macrocytic despite adequate thiamine supplementation), replete if low   Other thoughts May consider decrease of AM quetiapine in the future May consider decrease of PRN klonopin in the future (over past  few days, BZD significantly increased as 1 mg klonopin ~50 mg librium) May consider clonidine patch to aid Precedex wean  Will continue to follow; full eval tomorrow.   I personally spent 30  minutes on the unit in direct patient care. The direct patient care time included face-to-face time with the patient, reviewing the patient's chart, communicating with other professionals, and coordinating care. Greater than 50% of this time was spent in counseling or coordinating care with the patient regarding goals of hospitalization, psycho-education, and discharge planning needs.

## 2021-08-18 NOTE — TOC Progression Note (Signed)
Transition of Care [facility]) - Progression Note    Patient Details  Name: [name] MRN: [id number] Date of Birth: 1956-02-03  Transition [facility] CM/SW Contact  Charles Mac, RN Phone Number: 08/18/2021, 2:28 PM  Clinical Narrative:    MD requesting LTAC hospital for patient due to difficulties with vent weaning and anticipated prolonged vent wean.  Discussed this option with patient's Worker's Comp. case manager [name].  She is requesting MD note stating reason and need for [facility] hospital; will fax to [phone].   Spoke with patient's daughter, [name], regarding possible transition to [facility] hospital.  She is agreeable to this pending Worker's Comp. approval.  She prefers [facility] [place] [facility] [employer] needs.  Referral to [facility] admissions liaison, [name].  She will perform eligibility assessment and follow up with [facility] Case Manager. Will follow with updates as available.    Expected Discharge Plan: Long Term Acute Care (LTAC) Barriers to Discharge: Continued Medical Work up  Expected Discharge Plan and Services Expected Discharge Plan: Long Term Acute Care (LTAC)   Discharge Planning Services: CM Consult Post Acute Care Choice: Long Term Acute Care (LTAC) Living arrangements for the past 2 months: Single Family Home                                       Social Determinants of Health (SDOH) Interventions    Readmission Risk Interventions No flowsheet data found.  Charles Baton, RN, BSN  Trauma/Neuro ICU Case Manager [phone]

## 2021-08-18 NOTE — Progress Notes (Signed)
Handoff given to me by dayshift RN Chase that patient's RR goal is <40 and O2 Sats goal >88 per conversation with trauma MD Lovick.   Notified RT of this - patient remains on trach collar as he is meeting goals. RN will continue to monitor.  [name], RN

## 2021-08-18 NOTE — Progress Notes (Signed)
Physical Therapy Treatment Patient Details Name: [name] MRN: [id number] DOB: 1956/02/25 Today's Date: 08/18/2021   History of Present Illness 65 y/o male presented to ED on 11/8 after fall off 8 foot ladder. CT chest found small R apical pneumothorax, multiple displaced posterior R rib fxs, nondisplaced R iliac wing fx with widening of R SI joint, displaced fx both inferior pubic rami and L superior pubic ramus, and nondisplaced L sacral fx, SI screw 07/29/21. PMH: COPD, EtOH abuse, HTN, GERD, hep C    PT Comments    Patient continues to require +2-3 assist to get to EOB this date. Improved tolerance to sit EOB with core activation exercises in sitting. Patient with gargling sound requiring deep suction by RN prior to mobility. Patient motivated to work with therapy and attempting to mouth words but at times unable to read lips. Continue to recommend CIR level therapies to assist with maximizing functional mobility and safety.     Recommendations for follow up therapy are one component of a multi-disciplinary discharge planning process, led by the attending physician.  Recommendations may be updated based on patient status, additional functional criteria and insurance authorization.  Follow Up Recommendations  Acute inpatient rehab (3hours/day)     Assistance Recommended at Discharge Frequent or constant Supervision/Assistance  Equipment Recommendations  Other (comment) (TBD)    Recommendations for Other Services       Precautions / Restrictions Precautions Precautions: Fall Precaution Comments: foley, vent trach Restrictions Weight Bearing Restrictions: Yes RLE Weight Bearing: Non weight bearing (x8 weeks) LLE Weight Bearing: Weight bearing as tolerated     Mobility  Bed Mobility Overal bed mobility: Needs Assistance Bed Mobility: Sit to Supine;Supine to Sit   Sidelying to sit:  (+3 with HOB elevated helicopter) Supine to sit: HOB elevated;+2 for  safety/equipment;+2 for physical assistance;Max assist (+3 (A) to pivot helicopter to EOB) Sit to supine: +2 for safety/equipment;+2 for physical assistance;Total assist   General bed mobility comments: pt progressed to EOB with extensive help for lines / leads/ vent with RN deep suctioning just prior.    Transfers                   General transfer comment: not appropriate yet    Ambulation/Gait                   Stairs             Wheelchair Mobility    Modified Rankin (Stroke Patients Only)       Balance Overall balance assessment: Needs assistance Sitting-balance support: Bilateral upper extremity supported;Feet supported Sitting balance-Leahy Scale: Poor Sitting balance - Comments: strong L lateral lean. Pt reaching for foot board with R UE and activates core to shift weight. pt working hard to come to midline. pt holdin breath and benefits from vent support during these attempts Postural control: Left lateral lean;Posterior lean                                  Cognition Arousal/Alertness: Awake/alert Behavior During Therapy: WFL for tasks assessed/performed Overall Cognitive Status: Difficult to assess                                 General Comments: pt telling therapist 2 to indicate needing increased assist. Daughter [name] present and reports "he thinks you are  me" daughter coming into visual field and pt acknowledged willingness to continue.        Exercises      General Comments General comments (skin integrity, edema, etc.): VSS, 40% FIO2 PEEP 5 on vent trach at this time. Pt noted to have secretions with deep suction from RN prior to sitting up. risk for sacral wound and will need weight shifts for skin integrity      Pertinent Vitals/Pain Pain Assessment: CPOT Facial Expression: Grimacing Body Movements: Protection Muscle Tension: Relaxed Compliance with ventilator (intubated pts.): N/A Vocalization  (extubated pts.): Talking in normal tone or no sound CPOT Total: 3    Home Living                          Prior Function            PT Goals (current goals can now be found in the care plan section) Acute Rehab PT Goals PT Goal Formulation: With patient Time For Goal Achievement: 08/31/21 Potential to Achieve Goals: Good Progress towards PT goals: Progressing toward goals    Frequency    Min 5X/week      PT Plan Current plan remains appropriate    Co-evaluation PT/OT/SLP Co-Evaluation/Treatment: Yes Reason for Co-Treatment: Complexity of the patient's impairments (multi-system involvement);For patient/therapist safety;Necessary to address cognition/behavior during functional activity;To address functional/ADL transfers PT goals addressed during session: Mobility/safety with mobility;Balance        AM-PAC PT "6 Clicks" Mobility   Outcome Measure  Help needed turning from your back to your side while in a flat bed without using bedrails?: Total Help needed moving from lying on your back to sitting on the side of a flat bed without using bedrails?: Total Help needed moving to and from a bed to a chair (including a wheelchair)?: Total Help needed standing up from a chair using your arms (e.g., wheelchair or bedside chair)?: Total Help needed to walk in hospital room?: Total Help needed climbing 3-5 steps with a railing? : Total 6 Click Score: 6    End of Session Equipment Utilized During Treatment: Oxygen Activity Tolerance: Patient tolerated treatment well Patient left: in bed;with call bell/phone within reach;with family/visitor present Nurse Communication: Mobility status PT Visit Diagnosis: Muscle weakness (generalized) (M62.81);Pain;Unsteadiness on feet (R26.81) Pain - Right/Left: Right Pain - part of body: Hip     Time: 4854-6270 PT Time Calculation (min) (ACUTE ONLY): 25 min  Charges:  $Therapeutic Activity: 8-22 mins                      Debera Sterba A. Dan Humphreys PT, DPT Acute Rehabilitation Services Pager [phone] Office [phone]    Viviann Spare 08/18/2021, 5:14 PM

## 2021-08-19 LAB — GLUCOSE, CAPILLARY
Glucose-Capillary: 128 mg/dL — ABNORMAL HIGH (ref 70–99)
Glucose-Capillary: 90 mg/dL (ref 70–99)
Glucose-Capillary: 127 mg/dL — ABNORMAL HIGH (ref 70–99)
Glucose-Capillary: 140 mg/dL — ABNORMAL HIGH (ref 70–99)
Glucose-Capillary: 120 mg/dL — ABNORMAL HIGH (ref 70–99)

## 2021-08-19 LAB — CBC
HCT: 29.9 % — ABNORMAL LOW (ref 39.0–52.0)
Hemoglobin: 9.2 g/dL — ABNORMAL LOW (ref 13.0–17.0)
MCH: 31.9 pg (ref 26.0–34.0)
MCHC: 30.8 g/dL (ref 30.0–36.0)
MCV: 103.8 fL — ABNORMAL HIGH (ref 80.0–100.0)
Platelets: 300 10*3/uL (ref 150–400)
RBC: 2.88 MIL/uL — ABNORMAL LOW (ref 4.22–5.81)
RDW: 13.6 % (ref 11.5–15.5)
WBC: 4.3 10*3/uL (ref 4.0–10.5)
nRBC: 0 % (ref 0.0–0.2)

## 2021-08-19 LAB — BASIC METABOLIC PANEL
Anion gap: 9 (ref 5–15)
BUN: 19 mg/dL (ref 8–23)
CO2: 33 mmol/L — ABNORMAL HIGH (ref 22–32)
Calcium: 8.9 mg/dL (ref 8.9–10.3)
Chloride: 100 mmol/L (ref 98–111)
Creatinine, Ser: 0.63 mg/dL (ref 0.61–1.24)
GFR, Estimated: >60 mL/min (ref >60)
Glucose, Bld: 99 mg/dL (ref 70–99)
Potassium: 3.7 mmol/L (ref 3.5–5.1)
Sodium: 142 mmol/L (ref 135–145)

## 2021-08-19 MED ORDER — CLONAZEPAM 0.25 MG PO TBDP
0.5000 mg | ORAL_TABLET | Freq: Three times a day (TID) | ORAL | Status: DC | PRN
  Administered 2021-08-19: 0.5 mg
  Filled 2021-08-19 (×2): qty 2

## 2021-08-19 NOTE — Consult Note (Signed)
[facility] Face-to-Face Psychiatry Consult   Patient Identification: [name] MRN:  [id number] Principal Diagnosis: <principal problem not specified> Diagnosis:  Active Problems:   Fall  Assessment  [name] is a 65 y.o. male admitted medically 07/27/2021  3:31 PM for fall.Patient carries no previous  psychiatric diagnoses and has a past medical history of  COPD, HTN, GERD, and hx of Hep C.Psychiatry was consulted for concern from delirium and concern for increased anxiety when attempting to wean off of trach.   On initial examination, patient was too sedated to follow commands and could not be assessed with his current baseline being non verbal. Patient's presentation and hx from daughter endorsed that patient was delirious in the ICU.   Patient appears to have responded well to the medication adjustments made after assessment yesterday. Patient had a repeat attempt to remove trach and patient was noted to do better, but the attempt was still unsuccessful. After patient received a Passy Muir speaking valve and began talking. Patient appeared to have his baseline personality and denied depression or significant anxiety. Patient endorsed that he wants his trach to be removed and did not appear to endorse consciously that the trach brought him comfort or was a "crutch." Patient did not appear to be significantly bothered by his secretions causing decreased concern that the secretions worsening during removal could be triggering anxiety and overall feelings of not being able to breathe. Otherwise patient was mostly oriented and his concentration exam was fair, patient was able to calc quarters in a dollar but had trouble with the more complex quarters in $2.75. Patient attempted to count backwards from 42>27 and stopped on time and only skipped 30. Reports from various staff endorse that patient has been attempting to participate to the best of his abilities and do not endorse significant concern for  depression.   Meds Continue hydroxyzine 25 mg up to QID for (if partially effective, increase to 50 mg) - highly antihistaminergic w/o significant anticholinergic properties, secretions reportedly driving difficulty with TCT             - give before TCT and as first line for anxiety before klonopin Continue melatonin 3 mg QHS Decrease Klonopin to 0.52m TID PRN, intend to titrate down Discontinue Klonopin 151mTID PRN   CONTINUE Librium 1059mID Gabapentin 300 q8 Haldol 5 q6 PRN  Quetiapine 200 mg BID   Labs/workup folate, B12 (still macrocytic despite adequate thiamine supplementation), replete if low    Other thoughts May consider decrease of AM quetiapine in the future May consider clonidine patch to aid Precedex wean   Will continue to follow; full eval tomorrow.  Total Time spent with patient: 20 minutes  Subjective:   [name] is a [age]o. male patient admitted with fall and subsequent trauma.  HPI:  Patient appears up and cheerful with his valve talking to family. Patient denies SI, HI, and AVH. Patient denies that he has been especially anxious or depressed lately. Patient endorses that he has "felt like crap"since his fall but is feeling a bit better today when comparing to yesterday. Patient reports that he would like to no longer have the trach but if he has to have it he is ok. Patient denied that he felt significantly anxious during trials to remove the trach and was not particularly bothered by his secretions as long as he had a suction.  RN endorsed that the patient did appear to do better on attempt  to wean trach today and has overall appeared to improve significantly in the last 24 h. PT also endorsed that patient has been doing well and putting forth effort.    Past Medical History:  Past Medical History:  Diagnosis Date  . COPD (chronic obstructive pulmonary disease) ([place])   . GERD (gastroesophageal reflux disease)   . History of hepatitis C     treated several years ago through [place]  . Hypertension     Past Surgical History:  Procedure Laterality Date  . CATARACT EXTRACTION, BILATERAL    . EYE SURGERY Bilateral   . ORIF PELVIC FRACTURE WITH PERCUTANEOUS SCREWS Right 07/29/2021   Procedure: SI SCREW FIXATION OF RIGHT PELVIC RING;  Surgeon: Altamese Claverack-[place], MD;  Location: [facility];  Service: Orthopedics;  Laterality: Right;  . ORTHOPEDIC SURGERY Bilateral    pt states fracture repairs to arms and legs.  Marland Kitchen REVERSE SHOULDER ARTHROPLASTY Left 09/23/2019   Procedure: REVERSE SHOULDER ARTHROPLASTY;  Surgeon: Tania Ade, MD;  Location: [facility];  Service: Orthopedics;  Laterality: Left;  . TRACHEOSTOMY TUBE PLACEMENT N/A 08/10/2021   Procedure: TRACHEOSTOMY;  Surgeon: Georganna Skeans, MD;  Location: [facility] OR;  Service: General;  Laterality: N/A;   Family History:  Family History  Problem Relation Age of Onset  . Heart disease Mother   . Hypertension Father     Social History:  Social History   Substance and Sexual Activity  Alcohol Use Yes  . Alcohol/week: 42.0 standard drinks  . Types: 42 Cans of beer per week   Comment: daily - 6 drinks a day     Social History   Substance and Sexual Activity  Drug Use Yes  . Types: Marijuana   Comment: last marijuana 2-3 weeks ago    Social History   Socioeconomic History  . Marital status: Single    Spouse name: Not on file  . Number of children: 2  . Years of education: Not on file  . Highest education level: Not on file  Occupational History  . Occupation: Architect  Tobacco Use  . Smoking status: Former    Packs/day: 1.00    Years: 35.00    Pack years: 35.00    Types: Cigarettes    Start date: 36    Quit date: 10/01/2010    Years since quitting: 10.8  . Smokeless tobacco: Never  Vaping Use  . Vaping Use: Never used  Substance and Sexual Activity  . Alcohol use: Yes    Alcohol/week: 42.0 standard drinks    Types: 42 Cans of beer per week    Comment: daily - 6  drinks a day  . Drug use: Yes    Types: Marijuana    Comment: last marijuana 2-3 weeks ago  . Sexual activity: Not Currently  Other Topics Concern  . Not on file  Social History Narrative  . Not on file   Social Determinants of Health   Financial Resource Strain: Not on file  Food Insecurity: Not on file  Transportation Needs: Not on file  Physical Activity: Not on file  Stress: Not on file  Social Connections: Not on file   Additional Social History:    Allergies:  No Known Allergies  Labs:  Results for orders placed or performed during the hospital encounter of 07/27/21 (from the past 48 hour(s))  Glucose, capillary     Status: Abnormal   Collection Time: 08/17/21  3:37 PM  Result Value Ref Range   Glucose-Capillary 136 (H) 70 - 99  mg/dL    Comment: Glucose reference range applies only to samples taken after fasting for at least 8 hours.  Glucose, capillary     Status: Abnormal   Collection Time: 08/17/21  7:12 PM  Result Value Ref Range   Glucose-Capillary 101 (H) 70 - 99 mg/dL    Comment: Glucose reference range applies only to samples taken after fasting for at least 8 hours.  Glucose, capillary     Status: Abnormal   Collection Time: 08/17/21 11:07 PM  Result Value Ref Range   Glucose-Capillary 151 (H) 70 - 99 mg/dL    Comment: Glucose reference range applies only to samples taken after fasting for at least 8 hours.  Basic metabolic panel     Status: Abnormal   Collection Time: 08/18/21  2:30 AM  Result Value Ref Range   Sodium 137 135 - 145 mmol/L   Potassium 3.5 3.5 - 5.1 mmol/L   Chloride 103 98 - 111 mmol/L   CO2 28 22 - 32 mmol/L   Glucose, Bld 136 (H) 70 - 99 mg/dL    Comment: Glucose reference range applies only to samples taken after fasting for at least 8 hours.   BUN 16 8 - 23 mg/dL   Creatinine, Ser 0.59 (L) 0.61 - 1.24 mg/dL   Calcium 8.6 (L) 8.9 - 10.3 mg/dL   GFR, Estimated >60 >60 mL/min    Comment: (NOTE) Calculated using the CKD-EPI  Creatinine Equation (2021)    Anion gap 6 5 - 15    Comment: Performed at [facility] [address], [place], [place] [address]  CBC     Status: Abnormal   Collection Time: 08/18/21  2:30 AM  Result Value Ref Range   WBC 4.8 4.0 - 10.5 K/uL   RBC 2.68 (L) 4.22 - 5.81 MIL/uL   Hemoglobin 8.8 (L) 13.0 - 17.0 g/dL   HCT 27.8 (L) 39.0 - 52.0 %   MCV 103.7 (H) 80.0 - 100.0 fL   MCH 32.8 26.0 - 34.0 pg   MCHC 31.7 30.0 - 36.0 g/dL   RDW 13.9 11.5 - 15.5 %   Platelets 308 150 - 400 K/uL   nRBC 0.0 0.0 - 0.2 %    Comment: Performed at [facility], [place] [address] [place], [place] [address]  Glucose, capillary     Status: Abnormal   Collection Time: 08/18/21  3:15 AM  Result Value Ref Range   Glucose-Capillary 123 (H) 70 - 99 mg/dL    Comment: Glucose reference range applies only to samples taken after fasting for at least 8 hours.  Glucose, capillary     Status: Abnormal   Collection Time: 08/18/21  7:49 AM  Result Value Ref Range   Glucose-Capillary 102 (H) 70 - 99 mg/dL    Comment: Glucose reference range applies only to samples taken after fasting for at least 8 hours.  Culture, Respiratory w Gram Stain     Status: None (Preliminary result)   Collection Time: 08/18/21  8:22 AM   Specimen: Tracheal Aspirate; Respiratory  Result Value Ref Range   Specimen Description TRACHEAL ASPIRATE    Special Requests NONE    Gram Stain      NO WBC SEEN FEW GRAM VARIABLE ROD RARE GRAM POSITIVE COCCI IN PAIRS    Culture      MODERATE SERRATIA MARCESCENS CULTURE REINCUBATED FOR BETTER GROWTH Performed at [facility], [place] [address] [place], [place] [address]    Report  Status PENDING   Glucose, capillary     Status: Abnormal   Collection Time: 08/18/21 11:48 AM  Result Value Ref Range   Glucose-Capillary 121 (H) 70 - 99 mg/dL    Comment: Glucose reference range applies only to samples taken after fasting for at least 8 hours.  Basic metabolic panel     Status:  Abnormal   Collection Time: 08/18/21  3:12 PM  Result Value Ref Range   Sodium 141 135 - 145 mmol/L   Potassium 3.8 3.5 - 5.1 mmol/L   Chloride 104 98 - 111 mmol/L   CO2 31 22 - 32 mmol/L   Glucose, Bld 126 (H) 70 - 99 mg/dL    Comment: Glucose reference range applies only to samples taken after fasting for at least 8 hours.   BUN 16 8 - 23 mg/dL   Creatinine, Ser 0.62 0.61 - 1.24 mg/dL   Calcium 8.7 (L) 8.9 - 10.3 mg/dL   GFR, Estimated >60 >60 mL/min    Comment: (NOTE) Calculated using the CKD-EPI Creatinine Equation (2021)    Anion gap 6 5 - 15    Comment: Performed at [facility] [address] [place], [place] [address]  Glucose, capillary     Status: Abnormal   Collection Time: 08/18/21  3:41 PM  Result Value Ref Range   Glucose-Capillary 130 (H) 70 - 99 mg/dL    Comment: Glucose reference range applies only to samples taken after fasting for at least 8 hours.  Glucose, capillary     Status: Abnormal   Collection Time: 08/18/21  7:21 PM  Result Value Ref Range   Glucose-Capillary 119 (H) 70 - 99 mg/dL    Comment: Glucose reference range applies only to samples taken after fasting for at least 8 hours.  Glucose, capillary     Status: Abnormal   Collection Time: 08/18/21 11:16 PM  Result Value Ref Range   Glucose-Capillary 158 (H) 70 - 99 mg/dL    Comment: Glucose reference range applies only to samples taken after fasting for at least 8 hours.  Glucose, capillary     Status: Abnormal   Collection Time: 08/19/21  3:02 AM  Result Value Ref Range   Glucose-Capillary 128 (H) 70 - 99 mg/dL    Comment: Glucose reference range applies only to samples taken after fasting for at least 8 hours.  Basic metabolic panel     Status: Abnormal   Collection Time: 08/19/21  7:20 AM  Result Value Ref Range   Sodium 142 135 - 145 mmol/L   Potassium 3.7 3.5 - 5.1 mmol/L   Chloride 100 98 - 111 mmol/L   CO2 33 (H) 22 - 32 mmol/L   Glucose, Bld 99 70 - 99 mg/dL    Comment:  Glucose reference range applies only to samples taken after fasting for at least 8 hours.   BUN 19 8 - 23 mg/dL   Creatinine, Ser 0.63 0.61 - 1.24 mg/dL   Calcium 8.9 8.9 - 10.3 mg/dL   GFR, Estimated >60 >60 mL/min    Comment: (NOTE) Calculated using the CKD-EPI Creatinine Equation (2021)    Anion gap 9 5 - 15    Comment: Performed at [facility] [address], [place] [address]  CBC     Status: Abnormal   Collection Time: 08/19/21  7:20 AM  Result Value Ref Range   WBC 4.3 4.0 - 10.5 K/uL   RBC 2.88 (L) 4.22 - 5.81 MIL/uL  Hemoglobin 9.2 (L) 13.0 - 17.0 g/dL   HCT 29.9 (L) 39.0 - 52.0 %   MCV 103.8 (H) 80.0 - 100.0 fL   MCH 31.9 26.0 - 34.0 pg   MCHC 30.8 30.0 - 36.0 g/dL   RDW 13.6 11.5 - 15.5 %   Platelets 300 150 - 400 K/uL   nRBC 0.0 0.0 - 0.2 %    Comment: Performed at [facility] [address], [place], [place] [address]  Glucose, capillary     Status: None   Collection Time: 08/19/21  7:31 AM  Result Value Ref Range   Glucose-Capillary 90 70 - 99 mg/dL    Comment: Glucose reference range applies only to samples taken after fasting for at least 8 hours.  Glucose, capillary     Status: Abnormal   Collection Time: 08/19/21 11:55 AM  Result Value Ref Range   Glucose-Capillary 127 (H) 70 - 99 mg/dL    Comment: Glucose reference range applies only to samples taken after fasting for at least 8 hours.    Current Facility-Administered Medications  Medication Dose Route Frequency Provider Last Rate Last Admin  . 0.9 %  sodium chloride infusion  250 mL Intravenous Continuous Norm Parcel, PA-C 20 mL/hr at 08/11/21 0535 250 mL at 08/11/21 0535  . acetaminophen (TYLENOL) tablet 1,000 mg  1,000 mg Per Tube Q6H Barkley Boards R, PA-C   1,000 mg at 08/19/21 0507  . aspirin chewable tablet 81 mg  81 mg Per Tube Daily Georganna Skeans, MD   81 mg at 08/19/21 1036  . bethanechol (URECHOLINE) tablet 25 mg  25 mg Per Tube TID Norm Parcel, PA-C   25 mg  at 08/19/21 1036  . chlordiazePOXIDE (LIBRIUM) capsule 10 mg  10 mg Per Tube TID Stechschulte, Nickola Major, MD   10 mg at 08/19/21 1035  . chlorhexidine gluconate (MEDLINE KIT) (PERIDEX) 0.12 % solution 15 mL  15 mL Mouth Rinse BID Norm Parcel, PA-C   15 mL at 08/19/21 0751  . Chlorhexidine Gluconate Cloth 2 % PADS 6 each  6 each Topical Daily Norm Parcel, PA-C   6 each at 08/19/21 0900  . cholecalciferol (VITAMIN D3) tablet 2,000 Units  2,000 Units Per Tube Daily Norm Parcel, PA-C   2,000 Units at 08/19/21 1036  . clonazePAM (KLONOPIN) disintegrating tablet 0.5 mg  0.5 mg Per Tube TID PRN Cinderella, Margaret A      . dexmedetomidine (PRECEDEX) 400 MCG/100ML (4 mcg/mL) infusion  0.4-1.2 mcg/kg/hr Intravenous Titrated Norm Parcel, PA-C   Stopped at 08/19/21 [id number]  . enoxaparin (LOVENOX) injection 40 mg  40 mg Subcutaneous Q12H Norm Parcel, PA-C   40 mg at 08/19/21 1037  . feeding supplement (JEVITY 1.5 CAL/FIBER) liquid 1,000 mL  1,000 mL Per Tube Continuous Norm Parcel, PA-C 65 mL/hr at 08/19/21 1235 1,000 mL at 08/19/21 1235  . feeding supplement (PROSource TF) liquid 90 mL  90 mL Per Tube BID Norm Parcel, PA-C   90 mL at 08/19/21 1036  . [START ON 08/20/2021] fludrocortisone (FLORINEF) tablet 0.05 mg  0.05 mg Oral Daily [name], MD      . folic acid (FOLVITE) tablet 1 mg  1 mg Per Tube Daily Norm Parcel, PA-C   1 mg at 08/19/21 1036  . free water 250 mL  250 mL Per Tube Q6H Barkley Boards R, PA-C   250 mL at 08/19/21 1137  . gabapentin (NEURONTIN) 250  MG/5ML solution 300 mg  300 mg Per Tube Q8H Kinsinger, Arta Bruce, MD   300 mg at 08/19/21 0507  . glycopyrrolate (ROBINUL) tablet 2 mg  2 mg Per Tube BID Georganna Skeans, MD   2 mg at 08/19/21 1037  . guaiFENesin (ROBITUSSIN) 100 MG/5ML liquid 10 mL  10 mL Per Tube Q4H Barkley Boards R, PA-C   10 mL at 08/19/21 1228  . haloperidol (HALDOL) tablet 5 mg  5 mg Oral Q6H PRN [name], MD   5 mg at  08/16/21 0943   Or  . haloperidol lactate (HALDOL) injection 5 mg  5 mg Intramuscular Q6H PRN [name], MD       Or  . haloperidol lactate (HALDOL) injection 5 mg  5 mg Intravenous Q6H PRN [name], MD   5 mg at 08/17/21 0142  . HYDROmorphone (DILAUDID) injection 1-2 mg  1-2 mg Intravenous Q1H PRN Norm Parcel, PA-C   2 mg at 08/16/21 1048  . hydrOXYzine (ATARAX) tablet 25 mg  25 mg Per Tube QID PRN Cinderella, Margaret A   25 mg at 08/19/21 0745  . ibuprofen (ADVIL) tablet 600 mg  600 mg Per Tube Q6H PRN Kinsinger, Arta Bruce, MD   600 mg at 08/18/21 1006  . insulin aspart (novoLOG) injection 0-15 Units  0-15 Units Subcutaneous Q4H Norm Parcel, [place]   2 Units at 08/19/21 1229  . insulin glargine-yfgn (SEMGLEE) injection 5 Units  5 Units Subcutaneous Daily Norm Parcel, PA-C   5 Units at 08/19/21 1037  . ipratropium-albuterol (DUONEB) 0.5-2.5 (3) MG/3ML nebulizer solution 3 mL  3 mL Nebulization Q6H PRN Barkley Boards R, PA-C   3 mL at 08/02/21 0319  . lidocaine (LIDODERM) 5 % 1 patch  1 patch Transdermal Q24H Norm Parcel, PA-C   1 patch at 08/19/21 1037  . lipase/protease/amylase) (VIOKACE) tablets 20,880 Units  20,880 Units Per Tube Once Lovick, Montel Culver, MD       And  . sodium bicarbonate tablet 650 mg  650 mg Per Tube Once [name], MD      . MEDLINE mouth rinse  15 mL Mouth Rinse 10 times per day Norm Parcel, PA-C   15 mL at 08/19/21 1232  . melatonin tablet 3 mg  3 mg Per Tube q1800 Cinderella, Margaret A   3 mg at 08/18/21 1810  . methocarbamol (ROBAXIN) tablet 1,000 mg  1,000 mg Per Tube Q8H Barkley Boards R, PA-C   1,000 mg at 08/19/21 0507  . multivitamin with minerals tablet 1 tablet  1 tablet Per Tube Daily Norm Parcel, PA-C   1 tablet at 08/19/21 1036  . ondansetron (ZOFRAN-ODT) disintegrating tablet 4 mg  4 mg Oral Q6H PRN Norm Parcel, PA-C       Or  . ondansetron [facility]) injection 4 mg  4 mg Intravenous Q6H PRN Norm Parcel, PA-C   4 mg at 07/27/21 2048  . oxyCODONE (ROXICODONE) 5 MG/5ML solution 5-10 mg  5-10 mg Per Tube Q3H PRN Norm Parcel, PA-C   10 mg at 08/19/21 1131  . pantoprazole sodium (PROTONIX) 40 mg/20 mL oral suspension 40 mg  40 mg Per Tube Daily Norm Parcel, PA-C   40 mg at 08/19/21 1036  . polyethylene glycol (MIRALAX / GLYCOLAX) packet 17 g  17 g Per Tube Daily PRN Stechschulte, Nickola Major, MD   17 g at 08/18/21 0940  . pravastatin (PRAVACHOL)  tablet 40 mg  40 mg Per Tube Daily Stechschulte, Nickola Major, MD   40 mg at 08/19/21 1036  . QUEtiapine (SEROQUEL) tablet 200 mg  200 mg Per Tube BID Norm Parcel, PA-C   200 mg at 08/19/21 1036  . silodosin (RAPAFLO) capsule 8 mg  8 mg Per Tube Q breakfast Norm Parcel, PA-C   8 mg at 08/19/21 0745  . sodium chloride flush (NS) 0.9 % injection 10-40 mL  10-40 mL Intracatheter Q12H Norm Parcel, PA-C   10 mL at 08/19/21 1136  . sodium chloride flush (NS) 0.9 % injection 10-40 mL  10-40 mL Intracatheter PRN Norm Parcel, PA-C      . thiamine tablet 100 mg  100 mg Per Tube Daily Norm Parcel, PA-C   100 mg at 08/19/21 1036   Or  . thiamine (B-1) injection 100 mg  100 mg Intravenous Daily Norm Parcel, PA-C   100 mg at 08/10/21 3419    Psychiatric Specialty Exam:  Presentation  General Appearance: Appropriate for Environment  Eye Contact:Good  Speech:-- (understandable has trach in)  Speech Volume:Increased (trying to talk over noise of trach)  Handedness:No data recorded  Mood and Affect  Mood:Euthymic  Affect:Appropriate   Thought Process  Thought Processes:Coherent  Descriptions of Associations:Intact  Orientation:Full (Time, Place and Person)  Thought Content:Logical  History of Schizophrenia/Schizoaffective disorder:No data recorded Duration of Psychotic Symptoms:No data recorded Hallucinations:Hallucinations: None  Ideas of Reference:None  Suicidal Thoughts:Suicidal Thoughts: No  Homicidal  Thoughts:Homicidal Thoughts: No   Sensorium  Memory:Immediate Fair; Recent Good  Judgment:-- (IMproving)  Insight:Shallow   Executive Functions  Concentration:Fair  Attention Span:Good  [place]  Language:Good   Psychomotor Activity  Psychomotor Activity:Psychomotor Activity: Decreased   Assets  Assets:Social Support; Resilience   Sleep  Sleep:Sleep: Good   Physical Exam: Physical Exam HENT:     Head: Normocephalic and atraumatic.  Neurological:     Mental Status: He is alert.     Comments: Oriented to person, place, situation says the year is 46 then corrects himself to 2002.    ROS Blood pressure (!) 158/87, pulse (!) 107, temperature 99.4 F (37.4 C), temperature source Axillary, resp. rate (!) 30, height 6' 2" (1.88 m), weight 119.8 kg, SpO2 (!) 88 %. Body mass index is 33.91 kg/m.  PGY-2  Freida Busman, MD 08/19/2021 2:10 PM

## 2021-08-19 NOTE — Progress Notes (Addendum)
Speech Language Pathology Treatment: [name] Speaking valve  Patient Details Name: [name] MRN: [id number] DOB: 05-23-1956 Today's Date: 08/19/2021 Time: 7824-2353 SLP Time Calculation (min) (ACUTE ONLY): 24 min  Assessment / Plan / Recommendation Clinical Impression  Pt on trach collar, seen for functional communication with speaking valve and sister at beside. Strategies were reviewed and demonstrated for pt re: effective articulation and vocal intensity. Through discourse with sister he needed min-mod reminders to slow rate and over articulate and sister was able to comprehend approximately 75% of pt's utterances. He said "I want a Budweiser" after valve placed. He tolerated valve with RR, HR and SpO2 in normal range. Quality of vocalizations were wet and hoarse and unable to expectorate mucous needing suctioning. Cuff left deflated and valve in place and educated RN to remove valve if vitals change. Recommend he wear with full supervision. SLE and swallow assessment pending.    HPI HPI: 65 yo M with PMHx significant for COPD, HTN, GERD and Hep C presenting to [facility] after falling off of a ladder from 6 ft and complaining of severe chest pain and difficulty breathing with O2 sats in the 70s. CT revealed atrophy with small vessel chronic ischemic changes of deep cerebral white matter but no acute intracranial abnormalities.      SLP Plan  Continue with current plan of care      Recommendations for follow up therapy are one component of a multi-disciplinary discharge planning process, led by the attending physician.  Recommendations may be updated based on patient status, additional functional criteria and insurance authorization.    Recommendations         Patient may use Passy-Muir Speech Valve: Intermittently with supervision PMSV Supervision: Full MD: Please consider changing trach tube to : Cuffless         General recommendations: Rehab consult Oral Care  Recommendations: Oral care QID Follow Up Recommendations: Acute inpatient rehab (3hours/day) Assistance recommended at discharge: Frequent or constant Supervision/Assistance SLP Visit Diagnosis: Aphonia (R49.1) Plan: Continue with current plan of care                     [name]  08/19/2021, 12:48 PM  [name] Lonell Face.Ed Nurse, children's [phone] Office [phone]

## 2021-08-19 NOTE — Progress Notes (Signed)
Patient ID: [name], male   DOB: 10-28-1955, 65 y.o.   MRN: [id number] Follow up - Trauma Critical Care  Patient Details:    [name] is an 65 y.o. male.  Lines/tubes : Urethral Catheter Charles Sine, RN 16 Fr. (Active)  Indication for Insertion or Continuance of Catheter Acute urinary retention (I&O Cath for 24 hrs prior to catheter insertion- Inpatient Only) 08/19/21 0800  Site Assessment Clean;Dry;Intact 08/19/21 0800  Catheter Maintenance Bag below level of bladder;Catheter secured;Drainage bag/tubing not touching floor;Insertion date on drainage bag;No dependent loops;Seal intact 08/19/21 0800  Collection Container Standard drainage bag 08/19/21 0800  Securement Method Securing device (Describe) 08/19/21 0800  Urinary Catheter Interventions (if applicable) Unclamped 08/19/21 0800  Output (mL) 1400 mL 08/19/21 0000    Microbiology/Sepsis markers: Results for orders placed or performed during the hospital encounter of 07/27/21  Resp Panel by RT-PCR (Flu A&B, Covid) Nasopharyngeal Swab     Status: None   Collection Time: 07/27/21  3:34 PM   Specimen: Nasopharyngeal Swab; Nasopharyngeal(NP) swabs in vial transport medium  Result Value Ref Range Status   SARS Coronavirus 2 by RT PCR NEGATIVE NEGATIVE Final    Comment: (NOTE) SARS-CoV-2 target nucleic acids are NOT DETECTED.  The SARS-CoV-2 RNA is generally detectable in upper respiratory specimens during the acute phase of infection. The lowest concentration of SARS-CoV-2 viral copies this assay can detect is 138 copies/mL. A negative result does not preclude SARS-Cov-2 infection and should not be used as the sole basis for treatment or other patient management decisions. A negative result may occur with  improper specimen collection/handling, submission of specimen other than nasopharyngeal swab, presence of viral mutation(s) within the areas targeted by this assay, and inadequate number of viral copies(<138  copies/mL). A negative result must be combined with clinical observations, patient history, and epidemiological information. The expected result is Negative.  Fact Sheet for Patients:  BloggerCourse.com  Fact Sheet for Healthcare Providers:  SeriousBroker.it  This test is no t yet approved or cleared by the [place] FDA and  has been authorized for detection and/or diagnosis of SARS-CoV-2 by FDA under an Emergency Use Authorization (EUA). This EUA will remain  in effect (meaning this test can be used) for the duration of the COVID-19 declaration under Section 564(b)(1) of the Act, 21 U.S.C.section 360bbb-3(b)(1), unless the authorization is terminated  or revoked sooner.       Influenza A by PCR NEGATIVE NEGATIVE Final   Influenza B by PCR NEGATIVE NEGATIVE Final    Comment: (NOTE) The Xpert Xpress SARS-CoV-2/FLU/RSV plus assay is intended as an aid in the diagnosis of influenza from Nasopharyngeal swab specimens and should not be used as a sole basis for treatment. Nasal washings and aspirates are unacceptable for Xpert Xpress SARS-CoV-2/FLU/RSV testing.  Fact Sheet for Patients: BloggerCourse.com  Fact Sheet for Healthcare Providers: SeriousBroker.it  This test is not yet approved or cleared by the [place] FDA and has been authorized for detection and/or diagnosis of SARS-CoV-2 by FDA under an Emergency Use Authorization (EUA). This EUA will remain in effect (meaning this test can be used) for the duration of the COVID-19 declaration under Section 564(b)(1) of the Act, 21 U.S.C. section 360bbb-3(b)(1), unless the authorization is terminated or revoked.  Performed at [facility], [address], [place], [place] [address]   Surgical PCR screen     Status: None   Collection Time: 07/27/21  5:54 PM   Specimen: Nasal Mucosa; Nasal  Swab  Result Value Ref  Range Status   MRSA, PCR NEGATIVE NEGATIVE Final   Staphylococcus aureus NEGATIVE NEGATIVE Final    Comment: (NOTE) The Xpert SA Assay (FDA approved for NASAL specimens in patients 78 years of age and older), is one component of a comprehensive surveillance program. It is not intended to diagnose infection nor to guide or monitor treatment. Performed at [facility], [address], [place], [place] [address]   Culture, Respiratory w Gram Stain     Status: None   Collection Time: 08/04/21 11:53 AM   Specimen: Tracheal Aspirate; Respiratory  Result Value Ref Range Status   Specimen Description TRACHEAL ASPIRATE  Final   Special Requests NONE  Final   Gram Stain   Final    FEW SQUAMOUS EPITHELIAL CELLS PRESENT FEW WBC SEEN FEW GRAM POSITIVE COCCI MODERATE GRAM NEGATIVE RODS    Culture   Final    ABUNDANT HAEMOPHILUS INFLUENZAE BETA LACTAMASE NEGATIVE Performed at [facility], [address] [place], [place] [address]    Report Status 08/05/2021 FINAL  Final  Culture, Respiratory w Gram Stain     Status: None (Preliminary result)   Collection Time: 08/18/21  8:22 AM   Specimen: Tracheal Aspirate; Respiratory  Result Value Ref Range Status   Specimen Description TRACHEAL ASPIRATE  Final   Special Requests NONE  Final   Gram Stain   Final    NO WBC SEEN FEW GRAM VARIABLE ROD RARE GRAM POSITIVE COCCI IN PAIRS    Culture   Final    CULTURE REINCUBATED FOR BETTER GROWTH Performed at [facility], [address] [place], [place] [address]    Report Status PENDING  Incomplete    Anti-infectives:  Anti-infectives (From admission, onward)    Start     Dose/Rate Route Frequency Ordered Stop   08/06/21 0915  cefTRIAXone (ROCEPHIN) 2 g in sodium chloride 0.9 % 100 mL IVPB        2 g 200 mL/hr over 30 Minutes Intravenous Every 24 hours 08/06/21 0818 08/12/21 1103   07/29/21 1800  ceFAZolin (ANCEF) IVPB 2g/100 mL premix        2 g 200 mL/hr over 30 Minutes  Intravenous Every 8 hours 07/29/21 1241 07/30/21 1112   07/28/21 0815  ceFAZolin (ANCEF) IVPB 2g/100 mL premix  Status:  Discontinued        2 g 200 mL/hr over 30 Minutes Intravenous On call to O.R. 07/27/21 1744 07/28/21 0900       Best Practice/Protocols:  VTE Prophylaxis: Lovenox (prophylaxtic dose) Intermittent Sedation  Consults: Treatment Team:  Myrene Galas, MD    Studies:    Events:  Subjective:    Overnight Issues:   Objective:  Vital signs for last 24 hours: Temp:  [97.6 F (36.4 C)-100.2 F (37.9 C)] 98.3 F (36.8 C) (12/01 0800) Pulse Rate:  [68-111] 98 (12/01 0800) Resp:  [16-38] 29 (12/01 0800) BP: (93-168)/(54-102) 141/78 (12/01 0800) SpO2:  [78 %-100 %] 92 % (12/01 0800) FiO2 (%):  [40 %-60 %] 60 % (12/01 0900) Weight:  [119.8 kg] 119.8 kg (12/01 0258)  Hemodynamic parameters for last 24 hours:    Intake/Output from previous day: 11/30 0701 - 12/01 0700 In: 2226.4 [I.V.:446.4; NG/GT:1780] Out: 5150 [Urine:5150]  Intake/Output this shift: Total I/O In: 32.5 [I.V.:32.5] Out: -   Vent settings for last 24 hours: Vent Mode: PRVC FiO2 (%):  [40 %-60 %] 60 % Set Rate:  [16 bmp]  16 bmp Vt Set:  [650 mL] 650 mL PEEP:  [5 cmH20] 5 cmH20 Plateau Pressure:  [16 cmH20-24 cmH20] 16 cmH20  Physical Exam:  General: alert and HTC [place] Neuro: alert and talking HEENT/Neck: trach with PM Resp: some rhonchi CVS: RRR occ ectopy GI: soft, NT Extremities: edema 1+  Results for orders placed or performed during the hospital encounter of 07/27/21 (from the past 24 hour(s))  Glucose, capillary     Status: Abnormal   Collection Time: 08/18/21 11:48 AM  Result Value Ref Range   Glucose-Capillary 121 (H) 70 - 99 mg/dL  Basic metabolic panel     Status: Abnormal   Collection Time: 08/18/21  3:12 PM  Result Value Ref Range   Sodium 141 135 - 145 mmol/L   Potassium 3.8 3.5 - 5.1 mmol/L   Chloride 104 98 - 111 mmol/L   CO2 31 22 - 32 mmol/L    Glucose, Bld 126 (H) 70 - 99 mg/dL   BUN 16 8 - 23 mg/dL   Creatinine, Ser 1.19 0.61 - 1.24 mg/dL   Calcium 8.7 (L) 8.9 - 10.3 mg/dL   GFR, Estimated >14 >78 mL/min   Anion gap 6 5 - 15  Glucose, capillary     Status: Abnormal   Collection Time: 08/18/21  3:41 PM  Result Value Ref Range   Glucose-Capillary 130 (H) 70 - 99 mg/dL  Glucose, capillary     Status: Abnormal   Collection Time: 08/18/21  7:21 PM  Result Value Ref Range   Glucose-Capillary 119 (H) 70 - 99 mg/dL  Glucose, capillary     Status: Abnormal   Collection Time: 08/18/21 11:16 PM  Result Value Ref Range   Glucose-Capillary 158 (H) 70 - 99 mg/dL  Glucose, capillary     Status: Abnormal   Collection Time: 08/19/21  3:02 AM  Result Value Ref Range   Glucose-Capillary 128 (H) 70 - 99 mg/dL  Basic metabolic panel     Status: Abnormal   Collection Time: 08/19/21  7:20 AM  Result Value Ref Range   Sodium 142 135 - 145 mmol/L   Potassium 3.7 3.5 - 5.1 mmol/L   Chloride 100 98 - 111 mmol/L   CO2 33 (H) 22 - 32 mmol/L   Glucose, Bld 99 70 - 99 mg/dL   BUN 19 8 - 23 mg/dL   Creatinine, Ser 2.95 0.61 - 1.24 mg/dL   Calcium 8.9 8.9 - 62.1 mg/dL   GFR, Estimated >30 >86 mL/min   Anion gap 9 5 - 15  CBC     Status: Abnormal   Collection Time: 08/19/21  7:20 AM  Result Value Ref Range   WBC 4.3 4.0 - 10.5 K/uL   RBC 2.88 (L) 4.22 - 5.81 MIL/uL   Hemoglobin 9.2 (L) 13.0 - 17.0 g/dL   HCT 57.8 (L) 46.9 - 62.9 %   MCV 103.8 (H) 80.0 - 100.0 fL   MCH 31.9 26.0 - 34.0 pg   MCHC 30.8 30.0 - 36.0 g/dL   RDW 52.8 41.3 - 24.4 %   Platelets 300 150 - 400 K/uL   nRBC 0.0 0.0 - 0.2 %  Glucose, capillary     Status: None   Collection Time: 08/19/21  7:31 AM  Result Value Ref Range   Glucose-Capillary 90 70 - 99 mg/dL    Assessment & Plan: Present on Admission:  Fall    LOS: 23 days   Additional comments:I reviewed the patient's new clinical lab test  results. , Fall off ladder   Volume overload - continue  diuresis Acute hypoxic ventilator dependent respiratory failure - intubated 11/9, self-extubated and promptly re-intubated 11/12 AM. Extubated and reintubated 11/21. Trach 11/22. HTC 12h yesterday and back on now. On guaifenesis/robinul Agitation/delirium - barrier to TC trials, appreciate Psychiatry assist R PTX - resolved R rib fractures - aggressive multimodal pain control, IS, pulm toilet Pelvic fx (L sacrum, R iliac, b/l inf and L sup pubic rami) with pelvic hematoma - ortho c/s, Dr. 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 Wean fludrocortisone, aldo levels normal. Taper ordered COPD - prn duonebs, guaifenesin, trach 11/22, see above Hyperglycemia - SSI, glargine 5u  Hypokalemia - monitor and replace if needed Anemia - stable, monitor HTN - home lisinopril held for low BP Hx of Hep C FEN: NPO, OGT, TF, +BM DVT - SCDs, LMWH Foley - replaced for retention 11/17, 11/22, 11/29. PICC - removed ID - CTX x7d for H flu in 11/16 resp cx, end 11/24; repeat resp cx/CXR sent 11/30 reincubated  Dispo - ICU, therapies, HTC 24h if able. I spoke with him (PMV) and his sister at the bedside and discussed the plan.  Critical Care Total Time*: 34 Minutes  Violeta Gelinas, MD, MPH, FACS Trauma & General Surgery Use [email or web address] to contact on call provider  08/19/2021  *Care during the described time interval was provided by me. I have reviewed this patient's available data, including medical history, events of note, physical examination and test results as part of my evaluation.

## 2021-08-19 NOTE — Progress Notes (Signed)
OT treatment Note:  Clinical Impression: Pt progressing towards goals and VERY motivated. Pt mod A for grooming tasks with HOB elevated for line management, Pt max A +2 for bed mobility, able to sit EOB with VSS on trach collar working on seated balance and able to kick out both legs, then Pt attempted standing x2. Unable to maintain WB precautions on first attempt and so therapist used her foot to keep R foot out infront. Pt total A to return supine, and RN/NT in room for full bath of patient. Pt continues to demonstrate motivation, and stamina to participate in 3 hours of intensive rehab which is essential to maximize his safety and independence in ADL and functional transfers.   Of note: recognize LTACH recommendation due to weaning difficulties. If he does go to [facility], CIR should be considered once he can WBAT BLE.    08/19/21 0935  OT Visit Information  Last OT Received On 08/19/21  Assistance Needed +2  PT/OT/SLP Co-Evaluation/Treatment Yes  Reason for Co-Treatment Complexity of the patient's impairments (multi-system involvement);Necessary to address cognition/behavior during functional activity;For patient/therapist safety;To address functional/ADL transfers  PT goals addressed during session Mobility/safety with mobility;Balance;Strengthening/ROM  OT goals addressed during session ADL's and self-care;Strengthening/ROM  History of Present Illness 65 y/o male presented to ED on 11/8 after fall off 8 foot ladder. CT chest found small R apical pneumothorax, multiple displaced posterior R rib fxs, nondisplaced R iliac wing fx with widening of R SI joint, displaced fx both inferior pubic rami and L superior pubic ramus, and nondisplaced L sacral fx, SI screw 07/29/21. intubated 11/9, trach 11/22. Trach collar 11/30 PMH: COPD, EtOH abuse, HTN, GERD, hep C  Precautions  Precautions Fall  Precaution Comments foley, trach collar, tube feed  Restrictions  Weight Bearing Restrictions Yes  RLE  Weight Bearing NWB  LLE Weight Bearing WBAT  Other Position/Activity Restrictions dtr reports visual deficits at baseline, uses magnifying glass for near/upclose  Pain Assessment  Faces Pain Scale 4  Pain Location generalized with mobility and scrotum  Pain Descriptors / Indicators Discomfort  Cognition  Arousal/Alertness Awake/alert  Behavior During Therapy WFL for tasks assessed/performed  Overall Cognitive Status Difficult to assess  General Comments following commands appropriately. Required cues and repetition for WB status on R LE.  Difficult to assess due to Tracheostomy  ADL  Overall ADL's  Needs assistance/impaired  Eating/Feeding Details (indicate cue type and reason) trach Collar this session  Grooming Moderate assistance;Bed level  Upper Body Dressing  Maximal assistance;Bed level  General ADL Comments Session focused on EOB sitting this session. pt noted to have scrotal edema and elevation on pad provided to help with edema management.  Bed Mobility  Overal bed mobility Needs Assistance  Bed Mobility Supine to Sit;Sit to Supine  Supine to sit Max assist;+2 for physical assistance;+2 for safety/equipment;HOB elevated  Sit to supine Total assist;+2 for safety/equipment;+2 for physical assistance;HOB elevated  General bed mobility comments able to advance LEs towards EOB with assist but overall requiring maxA+2 for trunk elevation and repositioning hips towards EOB  Transfers  Overall transfer level Needs assistance  Equipment used 2 person hand held assist  Transfers Sit to/from Stand  Sit to Stand Max assist;+2 physical assistance;+2 safety/equipment  General transfer comment maxA+2 for each attempt. Attempted x 3. On first attempt, patient bearing weight through R LE. Placed therapist foot under patient's with knee extended to prevent WBing. On second attempt, patient able to partial stand with maxA+2. On third attempt, patient with  minimal hip clearance from EOB with  maxA+2.  Balance  Overall balance assessment Needs assistance  Sitting-balance support Bilateral upper extremity supported;Feet supported  Sitting balance-Leahy Scale Poor  Sitting balance - Comments L lateral lean in sitting but improved from previous session. Min-modA to maintain sitting with one instance of close superivision for <30 seconds  Postural control Left lateral lean  General Comments  General comments (skin integrity, edema, etc.) VSS on 10L O2 FiO2 60% trach collar  OT - End of Session  Equipment Utilized During Treatment Oxygen (10L O2 FiO2 60% trach collar)  Activity Tolerance Patient tolerated treatment well  Patient left in bed;with call bell/phone within reach;with nursing/sitter in room (RN staff giving Pt bath)  Nurse Communication Mobility status;Precautions;Need for lift equipment  OT Assessment/Plan  OT Plan Discharge plan remains appropriate  OT Visit Diagnosis Muscle weakness (generalized) (M62.81)  OT Frequency (ACUTE ONLY) Min 3X/week  Recommendations for Other Services Rehab consult  Follow Up Recommendations Acute inpatient rehab (3hours/day)  Assistance recommended at discharge Frequent or constant Supervision/Assistance  OT Equipment Other (comment)  AM-PAC OT "6 Clicks" Daily Activity Outcome Measure (Version 2)  Help from another person eating meals? 2  Help from another person taking care of personal grooming? 2  Help from another person toileting, which includes using toliet, bedpan, or urinal? 1  Help from another person bathing (including washing, rinsing, drying)? 1  Help from another person to put on and taking off regular upper body clothing? 2  Help from another person to put on and taking off regular lower body clothing? 1  6 Click Score 9  Progressive Mobility  What is the highest level of mobility based on the progressive mobility assessment? Level 1 (Bedfast) - Unable to balance while sitting on edge of bed  Mobility Sit up in bed/chair  position for meals  OT Goal Progression  Progress towards OT goals Progressing toward goals  Acute Rehab OT Goals  Patient Stated Goal to poop on the toilet and not the bed pan  OT Goal Formulation With patient  Time For Goal Achievement 08/31/21  Potential to Achieve Goals Good  OT Time Calculation  OT Start Time (ACUTE ONLY) 0849  OT Stop Time (ACUTE ONLY) 0917  OT Time Calculation (min) 28 min  OT General Charges  $OT Visit 1 Visit  OT Treatments  $Therapeutic Activity 8-22 mins   Nyoka Cowden OTR/L Acute Rehabilitation Services Pager: [phone] Office: [phone]

## 2021-08-19 NOTE — Progress Notes (Signed)
Physical Therapy Treatment Patient Details Name: [name] MRN: [id number] DOB: 1956-01-23 Today's Date: 08/19/2021   History of Present Illness 65 y/o male presented to ED on 11/8 after fall off 8 foot ladder. CT chest found small R apical pneumothorax, multiple displaced posterior R rib fxs, nondisplaced R iliac wing fx with widening of R SI joint, displaced fx both inferior pubic rami and L superior pubic ramus, and nondisplaced L sacral fx, SI screw 07/29/21. intubated 11/9, trach 11/22. Trach collar 11/30 PMH: COPD, EtOH abuse, HTN, GERD, hep C    PT Comments    Patient able to progress to partial stand this date with maxA+2. Patient with difficulty maintaining NWB on R LE with standing attempts but improved with R knee extension and therapist foot placed under patient's. Patient on trach collar this date with 10L O2 60% FiO2 with VSS throughout. Patient motivated to participate with therapy but limited by generalized weakness due to prolonged hospitalization. Updated discharge recommendation to PT at [facility] to continue progressing functional mobility.     Recommendations for follow up therapy are one component of a multi-disciplinary discharge planning process, led by the attending physician.  Recommendations may be updated based on patient status, additional functional criteria and insurance authorization.  Follow Up Recommendations  PT at Long-term acute care hospital     Assistance Recommended at Discharge Frequent or constant Supervision/Assistance  Equipment Recommendations  Other (comment) (TBD)    Recommendations for Other Services       Precautions / Restrictions Precautions Precautions: Fall Precaution Comments: foley, trach collar Restrictions Weight Bearing Restrictions: Yes RLE Weight Bearing: Non weight bearing LLE Weight Bearing: Weight bearing as tolerated     Mobility  Bed Mobility Overal bed mobility: Needs Assistance Bed Mobility: Supine to Sit;Sit  to Supine     Supine to sit: Max assist;+2 for physical assistance;+2 for safety/equipment;HOB elevated Sit to supine: Total assist;+2 for safety/equipment;+2 for physical assistance;HOB elevated   General bed mobility comments: able to advance LEs towards EOB with assist but overall requiring maxA+2 for trunk elevation and repositioning hips towards EOB    Transfers Overall transfer level: Needs assistance Equipment used: 2 person hand held assist Transfers: Sit to/from Stand Sit to Stand: Max assist;+2 physical assistance;+2 safety/equipment           General transfer comment: maxA+2 for each attempt. Attempted x 3. On first attempt, patient bearing weight through R LE. Placed therapist foot under patient's with knee extended to prevent WBing. On second attempt, patient able to partial stand with maxA+2. On third attempt, patient with minimal hip clearance from EOB with maxA+2.    Ambulation/Gait                   Stairs             Wheelchair Mobility    Modified Rankin (Stroke Patients Only)       Balance Overall balance assessment: Needs assistance Sitting-balance support: Bilateral upper extremity supported;Feet supported Sitting balance-Leahy Scale: Poor Sitting balance - Comments: L lateral lean in sitting but improved from previous session. Min-modA to maintain sitting with one instance of close superivision for <30 seconds Postural control: Left lateral lean                                  Cognition Arousal/Alertness: Awake/alert Behavior During Therapy: WFL for tasks assessed/performed Overall Cognitive Status: Difficult to assess  General Comments: following commands appropriately. Required cues and repetition for WB status on R LE.        Exercises      General Comments General comments (skin integrity, edema, etc.): VSS on 10L O2 FiO2 60% trach collar      Pertinent  Vitals/Pain Pain Assessment: Faces Faces Pain Scale: Hurts little more Facial Expression: Relaxed, neutral Body Movements: Absence of movements Muscle Tension: Relaxed Compliance with ventilator (intubated pts.): Tolerating ventilator or movement Vocalization (extubated pts.): N/A CPOT Total: 0 Pain Location: generalized with mobility and scrotum Pain Descriptors / Indicators: Discomfort Pain Intervention(s): Monitored during session    Home Living                          Prior Function            PT Goals (current goals can now be found in the care plan section) Acute Rehab PT Goals Patient Stated Goal: to be able to sit and use the bathroom PT Goal Formulation: With patient Time For Goal Achievement: 08/31/21 Potential to Achieve Goals: Good Progress towards PT goals: Progressing toward goals    Frequency    Min 4X/week      PT Plan Discharge plan needs to be updated;Frequency needs to be updated    Co-evaluation PT/OT/SLP Co-Evaluation/Treatment: Yes Reason for Co-Treatment: Complexity of the patient's impairments (multi-system involvement);To address functional/ADL transfers;For patient/therapist safety PT goals addressed during session: Mobility/safety with mobility;Balance;Strengthening/ROM        AM-PAC PT "6 Clicks" Mobility   Outcome Measure  Help needed turning from your back to your side while in a flat bed without using bedrails?: Total Help needed moving from lying on your back to sitting on the side of a flat bed without using bedrails?: Total Help needed moving to and from a bed to a chair (including a wheelchair)?: Total Help needed standing up from a chair using your arms (e.g., wheelchair or bedside chair)?: Total Help needed to walk in hospital room?: Total Help needed climbing 3-5 steps with a railing? : Total 6 Click Score: 6    End of Session Equipment Utilized During Treatment: Oxygen;Gait belt Activity Tolerance: Patient  tolerated treatment well Patient left: in bed;with call bell/phone within reach;with nursing/sitter in room Nurse Communication: Mobility status PT Visit Diagnosis: Muscle weakness (generalized) (M62.81);Pain;Unsteadiness on feet (R26.81) Pain - Right/Left: Right Pain - part of body: Hip     Time: 6295-2841 PT Time Calculation (min) (ACUTE ONLY): 28 min  Charges:  $Therapeutic Activity: 8-22 mins                     Yaslin Kirtley A. Dan Humphreys PT, DPT Acute Rehabilitation Services Pager [phone] Office [phone]    Viviann Spare 08/19/2021, 9:32 AM

## 2021-08-19 NOTE — Plan of Care (Signed)
  Problem: Clinical Measurements: Goal: Ability to maintain clinical measurements within normal limits will improve Outcome: Progressing   Problem: Activity: Goal: Risk for activity intolerance will decrease Outcome: Progressing   Problem: Coping: Goal: Level of anxiety will decrease Outcome: Progressing   

## 2021-08-19 NOTE — Progress Notes (Signed)
Pt. Noticed to have increased confusion gradually through afternoon. Vital signs stable, no extreme agitation at this time. Precedex off since 0900 today. Not time for hydroxyzine so RN administered PRN clonazepam for increased confusion in attempt to keep Precedex drip off. Emotional support provided to patient.

## 2021-08-20 ENCOUNTER — Inpatient Hospital Stay (HOSPITAL_COMMUNITY): Payer: BC Managed Care – PPO

## 2021-08-20 DIAGNOSIS — F064 Anxiety disorder due to known physiological condition: Secondary | ICD-10-CM

## 2021-08-20 LAB — GLUCOSE, CAPILLARY
Glucose-Capillary: 109 mg/dL — ABNORMAL HIGH (ref 70–99)
Glucose-Capillary: 115 mg/dL — ABNORMAL HIGH (ref 70–99)
Glucose-Capillary: 129 mg/dL — ABNORMAL HIGH (ref 70–99)
Glucose-Capillary: 134 mg/dL — ABNORMAL HIGH (ref 70–99)
Glucose-Capillary: 140 mg/dL — ABNORMAL HIGH (ref 70–99)
Glucose-Capillary: 153 mg/dL — ABNORMAL HIGH (ref 70–99)
Glucose-Capillary: 159 mg/dL — ABNORMAL HIGH (ref 70–99)

## 2021-08-20 LAB — BASIC METABOLIC PANEL
Anion gap: 8 (ref 5–15)
BUN: 18 mg/dL (ref 8–23)
CO2: 29 mmol/L (ref 22–32)
Calcium: 8.9 mg/dL (ref 8.9–10.3)
Chloride: 104 mmol/L (ref 98–111)
Creatinine, Ser: 0.61 mg/dL (ref 0.61–1.24)
GFR, Estimated: >60 mL/min (ref >60)
Glucose, Bld: 123 mg/dL — ABNORMAL HIGH (ref 70–99)
Potassium: 4.1 mmol/L (ref 3.5–5.1)
Sodium: 141 mmol/L (ref 135–145)

## 2021-08-20 MED ORDER — HYDROXYZINE HCL 25 MG PO TABS
25.0000 mg | ORAL_TABLET | Freq: Four times a day (QID) | ORAL | Status: DC
  Administered 2021-08-20 – 2021-08-30 (×36): 25 mg
  Filled 2021-08-20 (×45): qty 1

## 2021-08-20 MED ORDER — LEVOFLOXACIN IN D5W 750 MG/150ML IV SOLN
750.0000 mg | Freq: Every day | INTRAVENOUS | Status: DC
  Administered 2021-08-20 – 2021-08-23 (×4): 750 mg via INTRAVENOUS
  Filled 2021-08-20 (×4): qty 150

## 2021-08-20 MED ORDER — HYDROXYZINE HCL 10 MG/5ML PO SYRP
25.0000 mg | ORAL_SOLUTION | Freq: Four times a day (QID) | ORAL | Status: DC
  Administered 2021-08-20: 25 mg

## 2021-08-20 MED ORDER — FLUDROCORTISONE ACETATE 0.1 MG PO TABS
0.0500 mg | ORAL_TABLET | Freq: Every day | ORAL | Status: AC
  Administered 2021-08-20 – 2021-08-21 (×2): 0.05 mg
  Filled 2021-08-20: qty 0.5

## 2021-08-20 MED ORDER — CLONAZEPAM 0.25 MG PO TBDP
0.2500 mg | ORAL_TABLET | Freq: Three times a day (TID) | ORAL | Status: DC | PRN
Start: 2021-08-20 — End: 2021-08-21

## 2021-08-20 MED ORDER — HYDROXYZINE HCL 10 MG/5ML PO SYRP
25.0000 mg | ORAL_SOLUTION | Freq: Four times a day (QID) | ORAL | Status: DC
  Filled 2021-08-20 (×3): qty 12.5

## 2021-08-20 MED ORDER — HYDROXYZINE HCL 10 MG/5ML PO SYRP: 25.0000 mg | ORAL_SOLUTION | Freq: Four times a day (QID) | ORAL | Status: DC
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
## 2021-08-20 NOTE — Progress Notes (Signed)
Held TF when transferring patient to chair.  When reconnecting later to give meds, cortrak is clogged.  Have tried to unclog with soda but will not work. Have not been able to give meds since 1600.  Will notify MD. [name] C 6:25 PM

## 2021-08-20 NOTE — Progress Notes (Signed)
Trauma/Critical Care Follow Up Note  Subjective:    Overnight Issues:   Objective:  Vital signs for last 24 hours: Temp:  [99.2 F (37.3 C)-101.1 F (38.4 C)] 99.2 F (37.3 C) (12/02 0722) Pulse Rate:  [85-121] 100 (12/02 0900) Resp:  [17-39] 37 (12/02 0900) BP: (118-166)/(79-106) 155/104 (12/02 0900) SpO2:  [88 %-97 %] 95 % (12/02 0900) FiO2 (%):  [40 %-60 %] 40 % (12/02 0815) Weight:  [119.8 kg] 119.8 kg (12/02 0350)  Hemodynamic parameters for last 24 hours:    Intake/Output from previous day: 12/01 0701 - 12/02 0700 In: 2607 [I.V.:47; NG/GT:2560] Out: 2000 [Urine:2000]  Intake/Output this shift: Total I/O In: 125 [NG/GT:125] Out: 190 [Urine:190]  Vent settings for last 24 hours: Vent Mode: PRVC FiO2 (%):  [40 %-60 %] 40 % Set Rate:  [16 bmp] 16 bmp Vt Set:  [650 mL] 650 mL PEEP:  [5 cmH20] 5 cmH20 Plateau Pressure:  [18 cmH20] 18 cmH20  Physical Exam:  Gen: comfortable, no distress Neuro: non-focal exam HEENT: PERRL Neck: supple CV: RRR Pulm: unlabored breathing on TC Abd: soft, NT GU: clear yellow urine Extr: wwp, no edema   Results for orders placed or performed during the hospital encounter of 07/27/21 (from the past 24 hour(s))  Glucose, capillary     Status: Abnormal   Collection Time: 08/19/21 11:55 AM  Result Value Ref Range   Glucose-Capillary 127 (H) 70 - 99 mg/dL  Glucose, capillary     Status: Abnormal   Collection Time: 08/19/21  3:56 PM  Result Value Ref Range   Glucose-Capillary 140 (H) 70 - 99 mg/dL  Glucose, capillary     Status: Abnormal   Collection Time: 08/19/21  8:18 PM  Result Value Ref Range   Glucose-Capillary 120 (H) 70 - 99 mg/dL  Glucose, capillary     Status: Abnormal   Collection Time: 08/20/21 12:44 AM  Result Value Ref Range   Glucose-Capillary 153 (H) 70 - 99 mg/dL  Glucose, capillary     Status: Abnormal   Collection Time: 08/20/21  3:27 AM  Result Value Ref Range   Glucose-Capillary 134 (H) 70 - 99 mg/dL   Basic metabolic panel     Status: Abnormal   Collection Time: 08/20/21  4:35 AM  Result Value Ref Range   Sodium 141 135 - 145 mmol/L   Potassium 4.1 3.5 - 5.1 mmol/L   Chloride 104 98 - 111 mmol/L   CO2 29 22 - 32 mmol/L   Glucose, Bld 123 (H) 70 - 99 mg/dL   BUN 18 8 - 23 mg/dL   Creatinine, Ser 0.81 0.61 - 1.24 mg/dL   Calcium 8.9 8.9 - 44.8 mg/dL   GFR, Estimated >18 >56 mL/min   Anion gap 8 5 - 15  Glucose, capillary     Status: Abnormal   Collection Time: 08/20/21  7:20 AM  Result Value Ref Range   Glucose-Capillary 115 (H) 70 - 99 mg/dL  Glucose, capillary     Status: Abnormal   Collection Time: 08/20/21 10:59 AM  Result Value Ref Range   Glucose-Capillary 140 (H) 70 - 99 mg/dL    Assessment & Plan: The plan of care was discussed with the bedside nurse for the day, Junious Dresser, who is in agreement with this plan and no additional concerns were raised.   Present on Admission:  Fall    LOS: 24 days   Additional comments:I reviewed the patient's new clinical lab test results.   and I  reviewed the patients new imaging test results.    Fall off ladder   Volume overload - continue diuresis Acute hypoxic ventilator dependent respiratory failure - intubated 11/9, self-extubated and promptly re-intubated 11/12 AM. Extubated and reintubated 11/21. Trach 11/22. HTC. On guaifenesis/robinul Agitation/delirium - improve, appreciate Psychiatry assist R PTX - resolved R rib fractures - aggressive multimodal pain control, IS, pulm toilet Pelvic fx (L sacrum, R iliac, b/l inf and L sup pubic rami) with pelvic hematoma - ortho c/s, Dr. 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 Wean fludrocortisone, aldo levels normal. Taper ordered  COPD - prn duonebs, guaifenesin, trach 11/22, see above Hyperglycemia - SSI, glargine 5u  Hypokalemia - monitor and replace if needed Anemia - stable, monitor HTN - home lisinopril held for low  BP Hx of Hep C FEN: NPO, OGT, TF, +BM DVT - SCDs, LMWH Foley - replaced for retention 11/17, 11/22, 11/29 ID - CTX x7d for H flu in 11/16 resp cx, end 11/24; repeat resp cx/CXR sent 11/30 with Serratia and Steno, levaquin x7d  Dispo - therapies, HTC 24h, txf to 4NP, expect CIR for dispo  Diamantina Monks, MD Trauma & General Surgery Please use [email or web address] to contact on call provider  08/20/2021  *Care during the described time interval was provided by me. I have reviewed this patient's available data, including medical history, events of note, physical examination and test results as part of my evaluation.

## 2021-08-20 NOTE — Progress Notes (Signed)
Occupational Therapy Treatment Patient Details Name: [name] MRN: [id number] DOB: [birth year] Today's Date: 08/20/2021   History of present illness 65 y/o male presented to ED on 11/8 after fall off 8 foot ladder. CT chest found small R apical pneumothorax, multiple displaced posterior R rib fxs, nondisplaced R iliac wing fx with widening of R SI joint, displaced fx both inferior pubic rami and L superior pubic ramus, and nondisplaced L sacral fx, SI screw 07/29/21. intubated 11/9, trach 11/22. Trach collar 11/30 PMH: COPD, EtOH abuse, HTN, GERD, hep C   OT comments  Pt continues to be motivated to work with therapy. Pt seen as co-treat to safely mobilize OOB to chair. Initially tried lateral scoot, however pt unable to tolerate with RR in the 40s and SpO2 decreased to 84 on 10 L ; FiO2 40%. PMSV removed and pt rested before positioning Maximove sling to complete transfer OOB to chair. Pt asking for his PMSV to be worn during session to increase his ability to communicate. Very motivated to "get better" and states "I've got a long way to go". Pt very appreciative. Encourage nsg to mobilize OOB daily with [facility] and encourage use of incentive spirometer.  Will continue to follow acutely. Continue to recommend post acute rehab.    Recommendations for follow up therapy are one component of a multi-disciplinary discharge planning process, led by the attending physician.  Recommendations may be updated based on patient status, additional functional criteria and insurance authorization.    Follow Up Recommendations  Acute inpatient rehab (3hours/day)    Assistance Recommended at Discharge Frequent or constant Supervision/Assistance  Equipment Recommendations  Other (comment)    Recommendations for Other Services Rehab consult    Precautions / Restrictions Precautions Precautions: Fall Precaution Comments: foley, trach collar, cortrak Restrictions Weight Bearing Restrictions: Yes RLE  Weight Bearing: Non weight bearing LLE Weight Bearing: Weight bearing as tolerated Other Position/Activity Restrictions: dtr reports visual deficits at baseline, uses magnifying glass for near/upclose       Mobility Bed Mobility Overal bed mobility: Needs Assistance Bed Mobility: Supine to Sit     Supine to sit: Max assist;+2 for physical assistance;+2 for safety/equipment     General bed mobility comments: able to assist with advancing LEs towards EOB but overall maxA+2 to complete bed mobility with HOB elevated    Transfers Overall transfer level: Needs assistance   Transfers: Bed to chair/wheelchair/BSC             General transfer comment: Set up lateral scoot transfer but patient unable to maintain midline in sitting. Transferred to chair with [employer] Transfer via Lift Equipment: [employer] Overall balance assessment: Needs assistance Sitting-balance support: Single extremity supported;Feet supported Sitting balance-Leahy Scale: Poor Sitting balance - Comments: L lateral lean requiring maxA and at times totalA to maintain midline Postural control: Left lateral lean                                 ADL either performed or assessed with clinical judgement   ADL       Grooming: Moderate assistance                                      Extremity/Trunk Assessment Upper Extremity Assessment Upper Extremity Assessment: Generalized weakness   Lower Extremity Assessment Lower Extremity Assessment: Defer to  PT evaluation        Vision   Additional Comments: vision deficits at baseline   Perception     Praxis      Cognition Arousal/Alertness: Awake/alert Behavior During Therapy: WFL for tasks assessed/performed Overall Cognitive Status: Impaired/Different from baseline Area of Impairment: Attention;Safety/judgement;Memory;Awareness;Problem solving                   Current Attention Level: Selective Memory:  Decreased recall of precautions   Safety/Judgement: Decreased awareness of deficits;Decreased awareness of safety Awareness: Emergent Problem Solving: Slow processing;Difficulty sequencing;Decreased initiation General Comments: PMV on at beginning of session but increased RR and decreasing spO2          Exercises Exercises: Other exercises Other Exercises Other Exercises: relaxation to slow RR   Shoulder Instructions       General Comments Seen on 10L; 40% FiO2; RR max 40s; Desat to 84 with increaed time to decrease RR and increase SpO2    Pertinent Vitals/ Pain       Pain Assessment: Faces Faces Pain Scale: No hurt Pain Intervention(s): Monitored during session  Home Living                                          Prior Functioning/Environment              Frequency  Min 2X/week        Progress Toward Goals  OT Goals(current goals can now be found in the care plan section)  Progress towards OT goals: Progressing toward goals  Acute Rehab OT Goals Patient Stated Goal: to sit  in the chair OT Goal Formulation: With patient Time For Goal Achievement: 08/31/21 Potential to Achieve Goals: Good ADL Goals Pt Will Perform Grooming: with set-up;sitting Pt Will Perform Upper Body Bathing: with min assist;sitting Additional ADL Goal #1: pt will tolerate sitting with SBA ~10 minutes at EOB for increased indep with seated self care tasks Additional ADL Goal #2: pt will complete bed mobility including both rolling and supine<>sitting with min A in prep for participation with self care tasks  Plan Discharge plan remains appropriate    Co-evaluation    PT/OT/SLP Co-Evaluation/Treatment: Yes Reason for Co-Treatment: Complexity of the patient's impairments (multi-system involvement);Necessary to address cognition/behavior during functional activity;For patient/therapist safety PT goals addressed during session: Mobility/safety with mobility;Balance OT  goals addressed during session: ADL's and self-care      AM-PAC OT "6 Clicks" Daily Activity     Outcome Measure   Help from another person eating meals?: Total (NPO) Help from another person taking care of personal grooming?: A Lot Help from another person toileting, which includes using toliet, bedpan, or urinal?: Total Help from another person bathing (including washing, rinsing, drying)?: A Lot Help from another person to put on and taking off regular upper body clothing?: A Lot Help from another person to put on and taking off regular lower body clothing?: Total 6 Click Score: 9    End of Session Equipment Utilized During Treatment: Oxygen  OT Visit Diagnosis: Muscle weakness (generalized) (M62.81);Other abnormalities of gait and mobility (R26.89);History of falling (Z91.81);Other symptoms and signs involving cognitive function;Dizziness and giddiness (R42);Pain   Activity Tolerance Patient tolerated treatment well   Patient Left in chair;with call bell/phone within reach;with chair alarm set;with nursing/sitter in room   Nurse Communication Mobility status;Need for lift equipment;Other (comment) (OOB to chair dialy  with Maxisky needed)        Time: 9030-0923 OT Time Calculation (min): 36 min  Charges: OT General Charges $OT Visit: 1 Visit OT Treatments $Self Care/Home Management : 8-22 mins  Luisa Dago, OT/L   Acute OT Clinical Specialist Acute Rehabilitation Services Pager [phone] Office [phone]   [facility] 08/20/2021, 5:29 PM

## 2021-08-20 NOTE — Progress Notes (Signed)
RT NOTE: As of today patient has not met criteria (staying off the vent for 48 hrs) for RT to take the vent out of the room at this time. MD made aware at this time.

## 2021-08-20 NOTE — Progress Notes (Signed)
RT removed pts vent per Dr. Bedelia Person at this time.

## 2021-08-20 NOTE — Progress Notes (Signed)
RT NOTE:  Pt again agitated and constantly alarming vent. Switched patient back to ATC 40%. RN started precedex back and patient was suctioned multiple times. Pt tolerating ATC well at this time w/ RR 25 & SpO2 95%.

## 2021-08-20 NOTE — Progress Notes (Signed)
RT NOTE:  RT found patient extremely agitated and trying to get out of bed. RR in 40's w/ Spo2 in 60's. Pt put back on vent to rest for the night.

## 2021-08-20 NOTE — Evaluation (Signed)
Speech Language Pathology Evaluation Patient Details Name: [name] MRN: [id number] DOB: 1956/05/17 Today's Date: 08/20/2021 Time: 1100-1115 SLP Time Calculation (min) (ACUTE ONLY): 15 min  Problem List:  Patient Active Problem List   Diagnosis Date Noted   Fall 07/27/2021   Screening for prostate cancer 01/27/2021   Elevated LFTs 01/27/2021   Former smoker 01/27/2021   Chronic respiratory failure with hypoxia (HCC) 11/08/2020   Obstructive sleep apnea hypopnea, severe 09/24/2020   Exudative age-related macular degeneration of left eye with active choroidal neovascularization (HCC) 01/28/2020   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   Exudative age-related macular degeneration of right eye with active choroidal neovascularization (HCC) 01/28/2020   CAD (coronary artery disease) 10/16/2019   S/P reverse total shoulder arthroplasty, left 09/23/2019   Overweight (BMI 25.0-29.9) 08/13/2019   Hyperlipidemia 08/13/2019   Essential hypertension 08/13/2019   Gastroesophageal reflux disease 08/13/2019   Macular degeneration, wet (HCC) 11/09/2017   Lung nodules 10/28/2015   COPD mixed type (HCC) 10/01/2014   Chest x-ray abnormality 10/01/2014   Past Medical History:  Past Medical History:  Diagnosis Date   COPD (chronic obstructive pulmonary disease) (HCC)    GERD (gastroesophageal reflux disease)    History of hepatitis C    treated several years ago through [facility]   Hypertension    Past Surgical History:  Past Surgical History:  Procedure Laterality Date   CATARACT EXTRACTION, BILATERAL     EYE SURGERY Bilateral    ORIF PELVIC FRACTURE WITH PERCUTANEOUS SCREWS Right 07/29/2021   Procedure: SI SCREW FIXATION OF RIGHT PELVIC RING;  Surgeon: Myrene Galas, MD;  Location: [facility] OR;  Service: Orthopedics;  Laterality: Right;   ORTHOPEDIC SURGERY Bilateral    pt states fracture repairs to arms and legs.   REVERSE SHOULDER ARTHROPLASTY Left 09/23/2019   Procedure:  REVERSE SHOULDER ARTHROPLASTY;  Surgeon: Jones Broom, MD;  Location: [facility] ORS;  Service: Orthopedics;  Laterality: Left;   TRACHEOSTOMY TUBE PLACEMENT N/A 08/10/2021   Procedure: TRACHEOSTOMY;  Surgeon: Violeta Gelinas, MD;  Location: [facility] OR;  Service: General;  Laterality: N/A;   HPI:  65 y/o male presented to ED on 11/8 after fall off 8 foot ladder. CT chest found small R apical pneumothorax, multiple displaced posterior R rib fxs, nondisplaced R iliac wing fx with widening of R SI joint, displaced fx both inferior pubic rami and L superior pubic ramus, and nondisplaced L sacral fx, SI screw 07/29/21. intubated 11/9, trach 11/22. Trach collar 11/30 PMH: COPD, EtOH abuse, HTN, GERD, hep C   Assessment / Plan / Recommendation Clinical Impression  Pt participated in cognitive/communication assessment. Currently tolerating TC; PMV placed - pt able to clear secretions and generate functional/audible voice.  VS remained stable throughout use. Pt's speech clear/intelligible (wet, low volume). Normal fluency; normal expressive/receptive language.  Oriented to person, not to place ("I'm at work"), - DOW, +year, - month, - situation.  Demonstrated poor short-term/working memory; impaired insight/judgment; functional sustained and emerging selective attention.  Recommend ongoing SLP f/u for cognition, PMV use and toleration, and eventual swallow assessment (anticipate early next week).    SLP Assessment  SLP Recommendation/Assessment: Patient needs continued Speech Lanaguage Pathology Services SLP Visit Diagnosis: Cognitive communication deficit (R41.841)    Recommendations for follow up therapy are one component of a multi-disciplinary discharge planning process, led by the attending physician.  Recommendations may be updated based on patient status, additional functional criteria and insurance authorization.    Follow Up Recommendations  Acute inpatient rehab (  3hours/day)    Assistance Recommended at  Discharge  Frequent or constant Supervision/Assistance  Functional Status Assessment Patient has had a recent decline in their functional status and demonstrates the ability to make significant improvements in function in a reasonable and predictable amount of time.  Frequency and Duration min 2x/week  2 weeks      SLP Evaluation Cognition  Overall Cognitive Status: Impaired/Different from baseline Arousal/Alertness: Awake/alert Orientation Level: Oriented to person;Disoriented to place;Disoriented to time;Disoriented to situation Attention: Sustained Sustained Attention: WFL; selective attention: emerging Selective Attention Impairment: Verbal basic Memory: Impaired Memory Impairment: Storage deficit Awareness: Impaired Awareness Impairment: Intellectual impairment Problem Solving: Impaired Safety/Judgment: Impaired       Comprehension  Auditory Comprehension Overall Auditory Comprehension: Appears within functional limits for tasks assessed Reading Comprehension Reading Status: Not tested    Expression Expression Primary Mode of Expression: Verbal Verbal Expression Overall Verbal Expression: Appears within functional limits for tasks assessed Written Expression Dominant Hand: Right Written Expression: Not tested   Oral / Motor  Oral Motor/Sensory Function Overall Oral Motor/Sensory Function: Within functional limits Motor Speech Overall Motor Speech: Appears within functional limits for tasks assessed   Wanda Cellucci L. Samson Frederic, MA CCC/SLP Acute Rehabilitation Services Office number [phone] Pager [phone]                     [name] 08/20/2021, 11:24 AM

## 2021-08-20 NOTE — TOC Progression Note (Signed)
Transition of Care [facility]) - Progression Note    Patient Details  Name: [name] MRN: [id number] Date of Birth: [birth year]  Transition of Care [facility]) CM/SW Contact  Glennon Mac, RN Phone Number: 08/20/2021, 1:48pm  Clinical Narrative:    Received call from daughter, [name]. She has questions regarding patient moving to progressive unit, and questions about possible WC denial.  She states that if [facility] is denied, patient does have insurance through his work, and she can provide information.  Answered all of her questions to the best of my ability.     Expected Discharge Plan: Long Term Acute Care (LTAC) Barriers to Discharge: Continued Medical Work up  Expected Discharge Plan and Services Expected Discharge Plan: Long Term Acute Care (LTAC)   Discharge Planning Services: CM Consult Post Acute Care Choice: Long Term Acute Care (LTAC) Living arrangements for the past 2 months: Single Family Home                                       Social Determinants of Health (SDOH) Interventions    Readmission Risk Interventions No flowsheet data found.  [name], RN, BSN  Trauma/Neuro ICU Case Manager [phone]

## 2021-08-20 NOTE — Progress Notes (Signed)
Physical Therapy Treatment Patient Details Name: [name] MRN: [id number] DOB: 10/27/1955 Today's Date: 08/20/2021   History of Present Illness 65 y/o male presented to ED on 11/8 after fall off 8 foot ladder. CT chest found small R apical pneumothorax, multiple displaced posterior R rib fxs, nondisplaced R iliac wing fx with widening of R SI joint, displaced fx both inferior pubic rami and L superior pubic ramus, and nondisplaced L sacral fx, SI screw 07/29/21. intubated 11/9, trach 11/22. Trach collar 11/30 PMH: COPD, EtOH abuse, HTN, GERD, hep C    PT Comments    Patient continues to require heavy +2 assist for bed mobility. Patient progressed OOB to chair with use of Maxisky due to patient's inability to maintain midline sitting posture to attempt lateral scoot transfer. Patient with L lateral lean in sitting requiring max-totalA to maintain midline. Able to verbalize with PMV in place during bed mobility but increasing RR and decreasing spO2 so removed for rest of session. D/c plan remains appropriate.    Recommendations for follow up therapy are one component of a multi-disciplinary discharge planning process, led by the attending physician.  Recommendations may be updated based on patient status, additional functional criteria and insurance authorization.  Follow Up Recommendations  PT at [facility]-term acute care hospital     Assistance Recommended at Discharge Frequent or constant Supervision/Assistance  Equipment Recommendations  Other (comment) (TBD)    Recommendations for Other Services       Precautions / Restrictions Precautions Precautions: Fall Precaution Comments: foley, trach collar, cortrak Restrictions Weight Bearing Restrictions: Yes RLE Weight Bearing: Non weight bearing LLE Weight Bearing: Weight bearing as tolerated     Mobility  Bed Mobility Overal bed mobility: Needs Assistance Bed Mobility: Supine to Sit     Supine to sit: Max assist;+2 for  physical assistance;+2 for safety/equipment     General bed mobility comments: able to assist with advancing LEs towards EOB but overall maxA+2 to complete bed mobility with HOB elevated    Transfers Overall transfer level: Needs assistance   Transfers: Bed to chair/wheelchair/BSC             General transfer comment: Set up lateral scoot transfer but patient unable to maintain midline in sitting. Transferred to chair with [employer] (Stroke Patients Only)       Balance Overall balance assessment: Needs assistance Sitting-balance support: Single extremity supported;Feet supported Sitting balance-Leahy Scale: Poor Sitting balance - Comments: L lateral lean requiring maxA and at times totalA to maintain midline Postural control: Left lateral lean                                  Cognition Arousal/Alertness: Awake/alert Behavior During Therapy: WFL for tasks assessed/performed Overall Cognitive Status: Impaired/Different from baseline                                 General Comments: following commands appropriately. PMV on at beginning of session but increased RR and decreasing spO2        Exercises      General Comments  Pertinent Vitals/Pain Pain Assessment: Faces Faces Pain Scale: No hurt Pain Intervention(s): Monitored during session    Home Living                          Prior Function            PT Goals (current goals can now be found in the care plan section) Acute Rehab PT Goals Patient Stated Goal: did not state PT Goal Formulation: With patient Time For Goal Achievement: 08/31/21 Potential to Achieve Goals: Fair Progress towards PT goals: Progressing toward goals    Frequency    Min 3X/week      PT Plan Current plan remains appropriate     Co-evaluation PT/OT/SLP Co-Evaluation/Treatment: Yes Reason for Co-Treatment: For patient/therapist safety;To address functional/ADL transfers;Complexity of the patient's impairments (multi-system involvement);Necessary to address cognition/behavior during functional activity PT goals addressed during session: Mobility/safety with mobility;Balance        AM-PAC PT "6 Clicks" Mobility   Outcome Measure  Help needed turning from your back to your side while in a flat bed without using bedrails?: Total Help needed moving from lying on your back to sitting on the side of a flat bed without using bedrails?: Total Help needed moving to and from a bed to a chair (including a wheelchair)?: Total Help needed standing up from a chair using your arms (e.g., wheelchair or bedside chair)?: Total Help needed to walk in hospital room?: Total Help needed climbing 3-5 steps with a railing? : Total 6 Click Score: 6    End of Session Equipment Utilized During Treatment: Oxygen;Gait belt Activity Tolerance: Patient tolerated treatment well Patient left: in chair;with call bell/phone within reach;with nursing/sitter in room Nurse Communication: Mobility status PT Visit Diagnosis: Muscle weakness (generalized) (M62.81);Pain;Unsteadiness on feet (R26.81) Pain - Right/Left: Right Pain - part of body: Hip     Time: 0737-1062 PT Time Calculation (min) (ACUTE ONLY): 35 min  Charges:  $Therapeutic Activity: 8-22 mins                     Haila Dena A. Dan Humphreys PT, DPT Acute Rehabilitation Services Pager [phone] Office [phone]    Viviann Spare 08/20/2021, 5:20 PM

## 2021-08-21 LAB — BASIC METABOLIC PANEL
Anion gap: 9 (ref 5–15)
BUN: 17 mg/dL (ref 8–23)
CO2: 31 mmol/L (ref 22–32)
Calcium: 8.7 mg/dL — ABNORMAL LOW (ref 8.9–10.3)
Chloride: 100 mmol/L (ref 98–111)
Creatinine, Ser: 0.62 mg/dL (ref 0.61–1.24)
GFR, Estimated: >60 mL/min (ref >60)
Glucose, Bld: 154 mg/dL — ABNORMAL HIGH (ref 70–99)
Potassium: 4 mmol/L (ref 3.5–5.1)
Sodium: 140 mmol/L (ref 135–145)

## 2021-08-21 LAB — CULTURE, RESPIRATORY W GRAM STAIN: Gram Stain: NONE SEEN

## 2021-08-21 LAB — GLUCOSE, CAPILLARY
Glucose-Capillary: 126 mg/dL — ABNORMAL HIGH (ref 70–99)
Glucose-Capillary: 128 mg/dL — ABNORMAL HIGH (ref 70–99)
Glucose-Capillary: 128 mg/dL — ABNORMAL HIGH (ref 70–99)
Glucose-Capillary: 135 mg/dL — ABNORMAL HIGH (ref 70–99)
Glucose-Capillary: 144 mg/dL — ABNORMAL HIGH (ref 70–99)
Glucose-Capillary: 156 mg/dL — ABNORMAL HIGH (ref 70–99)

## 2021-08-21 MED ORDER — CLONAZEPAM 0.25 MG PO TBDP
0.2500 mg | ORAL_TABLET | Freq: Two times a day (BID) | ORAL | Status: AC
  Administered 2021-08-21 – 2021-08-23 (×5): 0.25 mg
  Filled 2021-08-21 (×5): qty 1

## 2021-08-21 MED ORDER — FUROSEMIDE 10 MG/ML IJ SOLN: 20.0000 mg | Freq: Once | INTRAMUSCULAR | Status: DC

## 2021-08-21 NOTE — Progress Notes (Signed)
RT actively suctioning Trach- vs retaken- pt reoxygenating at this time- no acute distress, RN at bedside

## 2021-08-21 NOTE — Progress Notes (Signed)
Trauma/Critical Care Follow Up Note  Subjective:    Overnight Issues: No acute issues, off vent for >24 hours.  Objective:  Vital signs for last 24 hours: Temp:  [98 F (36.7 C)-100.4 F (38 C)] 98 F (36.7 C) (12/03 0800) Pulse Rate:  [95-120] 95 (12/03 0800) Resp:  [17-42] 28 (12/03 0800) BP: (110-172)/(63-112) 124/81 (12/03 0800) SpO2:  [87 %-97 %] 96 % (12/03 0800) FiO2 (%):  [40 %-60 %] 60 % (12/03 0325) Weight:  [109.5 kg] 109.5 kg (12/03 0500)  Hemodynamic parameters for last 24 hours:    Intake/Output from previous day: 12/02 0701 - 12/03 0700 In: 2606.3 [NG/GT:2606.3] Out: 1505 [Urine:1505]  Intake/Output this shift: Total I/O In: -  Out: 150 [Urine:150]  Vent settings for last 24 hours: FiO2 (%):  [40 %-60 %] 60 %  Physical Exam:  Gen: comfortable, no distress Neuro: non-focal exam HEENT: Cortrak in place with tube feeds running Neck: supple CV: RRR Pulm: unlabored breathing on TC 10L Abd: soft, nondistended, nontender GU: clear yellow urine Extr: wwp, no edema   Results for orders placed or performed during the hospital encounter of 07/27/21 (from the past 24 hour(s))  Glucose, capillary     Status: Abnormal   Collection Time: 08/20/21 10:59 AM  Result Value Ref Range   Glucose-Capillary 140 (H) 70 - 99 mg/dL  Glucose, capillary     Status: Abnormal   Collection Time: 08/20/21  3:46 PM  Result Value Ref Range   Glucose-Capillary 109 (H) 70 - 99 mg/dL  Glucose, capillary     Status: Abnormal   Collection Time: 08/20/21  7:07 PM  Result Value Ref Range   Glucose-Capillary 129 (H) 70 - 99 mg/dL  Glucose, capillary     Status: Abnormal   Collection Time: 08/20/21 11:18 PM  Result Value Ref Range   Glucose-Capillary 159 (H) 70 - 99 mg/dL  Glucose, capillary     Status: Abnormal   Collection Time: 08/21/21  3:51 AM  Result Value Ref Range   Glucose-Capillary 156 (H) 70 - 99 mg/dL  Basic metabolic panel     Status: Abnormal   Collection  Time: 08/21/21  5:02 AM  Result Value Ref Range   Sodium 140 135 - 145 mmol/L   Potassium 4.0 3.5 - 5.1 mmol/L   Chloride 100 98 - 111 mmol/L   CO2 31 22 - 32 mmol/L   Glucose, Bld 154 (H) 70 - 99 mg/dL   BUN 17 8 - 23 mg/dL   Creatinine, Ser 2.45 0.61 - 1.24 mg/dL   Calcium 8.7 (L) 8.9 - 10.3 mg/dL   GFR, Estimated >80 >99 mL/min   Anion gap 9 5 - 15  Glucose, capillary     Status: Abnormal   Collection Time: 08/21/21  7:30 AM  Result Value Ref Range   Glucose-Capillary 128 (H) 70 - 99 mg/dL    Assessment & Plan: The plan of care was discussed with the bedside nurse for the day, who is in agreement with this plan and no additional concerns were raised.   Present on Admission:  Fall    LOS: 25 days   Additional comments:I reviewed the patient's new clinical lab test results.   and I reviewed the patients new imaging test results.    Fall off ladder   Acute hypoxic ventilator dependent respiratory failure - intubated 11/9, self-extubated and promptly re-intubated 11/12 AM. Extubated and reintubated 11/21. Trach 11/22, now tolerating trach collar. Agitation/delirium - improved, appreciate Psychiatry assist.  Wean klonipin to BID today, continue librium at current dose. R PTX - resolved R rib fractures - aggressive multimodal pain control, IS, pulm toilet Pelvic fx (L sacrum, R iliac, b/l inf and L sup pubic rami) with pelvic hematoma - ortho c/s, Dr. 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 Wean fludrocortisone, aldo levels normal. Taper ordered  COPD - prn duonebs, guaifenesin, trach 11/22, see above Hyperglycemia - SSI, glargine 5u, glc well-controlled Hypokalemia - monitor and replace if needed Anemia - stable, monitor HTN - home lisinopril held for low BP Hx of Hep C FEN: NPO, TF via Cortrak, +BM DVT - SCDs, LMWH Foley - replaced for retention 11/17, 11/22, 11/29 ID - CTX x7d for H flu in 11/16 resp cx, end  11/24; repeat resp cx/CXR sent 11/30 with Serratia and Steno, levaquin x7d  Dispo - therapies, HTC 24h, transfer to 4NP today, expect CIR for dispo  Charles Simas, MD [facility] Surgery General, Hepatobiliary and Pancreatic Surgery 08/21/21 9:28 AM   08/21/2021  *Care during the described time interval was provided by me. I have reviewed this patient's available data, including medical history, events of note, physical examination and test results as part of my evaluation.

## 2021-08-22 ENCOUNTER — Inpatient Hospital Stay (HOSPITAL_COMMUNITY): Payer: BC Managed Care – PPO

## 2021-08-22 LAB — CBC
HCT: 30.1 % — ABNORMAL LOW (ref 39.0–52.0)
Hemoglobin: 9 g/dL — ABNORMAL LOW (ref 13.0–17.0)
MCH: 32.1 pg (ref 26.0–34.0)
MCHC: 29.9 g/dL — ABNORMAL LOW (ref 30.0–36.0)
MCV: 107.5 fL — ABNORMAL HIGH (ref 80.0–100.0)
Platelets: 257 10*3/uL (ref 150–400)
RBC: 2.8 MIL/uL — ABNORMAL LOW (ref 4.22–5.81)
RDW: 13.4 % (ref 11.5–15.5)
WBC: 5.2 10*3/uL (ref 4.0–10.5)
nRBC: 0 % (ref 0.0–0.2)

## 2021-08-22 LAB — GLUCOSE, CAPILLARY
Glucose-Capillary: 118 mg/dL — ABNORMAL HIGH (ref 70–99)
Glucose-Capillary: 129 mg/dL — ABNORMAL HIGH (ref 70–99)
Glucose-Capillary: 131 mg/dL — ABNORMAL HIGH (ref 70–99)
Glucose-Capillary: 133 mg/dL — ABNORMAL HIGH (ref 70–99)
Glucose-Capillary: 135 mg/dL — ABNORMAL HIGH (ref 70–99)
Glucose-Capillary: 138 mg/dL — ABNORMAL HIGH (ref 70–99)
Glucose-Capillary: 140 mg/dL — ABNORMAL HIGH (ref 70–99)
Glucose-Capillary: 145 mg/dL — ABNORMAL HIGH (ref 70–99)

## 2021-08-22 LAB — BASIC METABOLIC PANEL
Anion gap: 8 (ref 5–15)
BUN: 20 mg/dL (ref 8–23)
CO2: 31 mmol/L (ref 22–32)
Calcium: 8.4 mg/dL — ABNORMAL LOW (ref 8.9–10.3)
Chloride: 97 mmol/L — ABNORMAL LOW (ref 98–111)
Creatinine, Ser: 0.6 mg/dL — ABNORMAL LOW (ref 0.61–1.24)
GFR, Estimated: >60 mL/min (ref >60)
Glucose, Bld: 147 mg/dL — ABNORMAL HIGH (ref 70–99)
Potassium: 4.3 mmol/L (ref 3.5–5.1)
Sodium: 136 mmol/L (ref 135–145)

## 2021-08-22 MED ORDER — LACTATED RINGERS IV BOLUS
500.0000 mL | Freq: Once | INTRAVENOUS | Status: AC
  Administered 2021-08-22: 03:00:00 500 mL via INTRAVENOUS

## 2021-08-22 MED ORDER — GLYCOPYRROLATE 1 MG PO TABS
2.0000 mg | ORAL_TABLET | Freq: Three times a day (TID) | ORAL | Status: DC
  Administered 2021-08-22 – 2021-08-30 (×24): 2 mg
  Filled 2021-08-22 (×28): qty 2

## 2021-08-22 MED ORDER — FUROSEMIDE 10 MG/ML IJ SOLN
20.0000 mg | Freq: Once | INTRAMUSCULAR | Status: AC
  Administered 2021-08-22: 15:00:00 20 mg via INTRAVENOUS
  Filled 2021-08-22: qty 2

## 2021-08-22 NOTE — Progress Notes (Signed)
Pt transferred back in to [facility] room 19. Dois Davenport at bedside. Daughter to be notified by phone by RN Charlynne Pander.

## 2021-08-22 NOTE — Progress Notes (Signed)
I was notified that patient had increased WOB with subjective dyspnea on 80% oxygen via HFNC. Patient transferred back to ICU and placed back on ventilator. CXR shows opacification of bilateral lower lobes, worse on right, and slightly worsened compared to CXR a few days ago. Now on PRVC at 70% FiO2, HR in low 100s, O2 sats at 100%. Will rest on PRVC for a few hours, then transition to PSV. Wean FiO2 as tolerated. Patient needs frequent suctioning, will also begin chest PT to help clear secretions.

## 2021-08-22 NOTE — Progress Notes (Signed)
Pt placed on PMV while I was in room. Pt tolerated for approximately 8 minutes and Sp02 dropped to 88%. PMV removed and Sp02 returned to 92%

## 2021-08-22 NOTE — Progress Notes (Signed)
Patient's work of breathing increased and he indicated he was in need of suction. Was able to make two passes with  suction but patient O2 dropped to low 80's and unable to tolerate more. Assisted patient to sit up and encouraged him to breathe deeply but patient unable to recover sats to above 89% even after 15 minutes. Notified respiratory and increased O2 to 80%. Patient able to recover sats to low 90's. Respiratory suctioned patient again. Patient RR in 30's but appears to be resting and not in distress. Will continue to monitor closely.

## 2021-08-22 NOTE — Progress Notes (Signed)
RT assessed patient and notified MD that patient work of breathing had increased. Patient gesturing to RN and RT and mouthing that he was SOB. HR in 120's and RR in 40's. Patient using accessory muscles to breathe and requesting suction. Suction produced hemoptysis and patient still labored. MD ordered patient to be transferred back to ICU and placed on vent. RN and RT transferred patient to ICU room 19 and handed off to [employer]. Notified patient's daughter [name] that he had been transferred.

## 2021-08-22 NOTE — Progress Notes (Signed)
Trauma/Critical Care Follow Up Note  Subjective:    Overnight Issues: Transferred to progressive care, remains on HFNC. Had a brief trial of PMV this morning. Having a lot of secretions.  Objective:  Vital signs for last 24 hours: Temp:  [97.7 F (36.5 C)-99.9 F (37.7 C)] 98.5 F (36.9 C) (12/04 0953) Pulse Rate:  [95-117] 107 (12/04 0900) Resp:  [20-39] 33 (12/04 0900) BP: (87-164)/(59-86) 135/76 (12/04 0900) SpO2:  [70 %-98 %] 92 % (12/04 0900) FiO2 (%):  [60 %] 60 % (12/04 0953) Weight:  [117.4 kg] 117.4 kg (12/04 0610)  Hemodynamic parameters for last 24 hours:    Intake/Output from previous day: 12/03 0701 - 12/04 0700 In: 1629.8 [NG/GT:1085; IV Piggyback:544.8] Out: 485 [Urine:485]  Intake/Output this shift: No intake/output data recorded.  Vent settings for last 24 hours: FiO2 (%):  [60 %] 60 %  Physical Exam:  Gen: comfortable, no distress Neuro: alert, non-focal exam HEENT: Cortrak in place with tube feeds running Neck: supple CV: RRR Pulm: unlabored breathing on TC 10L Abd: soft, nondistended, nontender GU: clear yellow urine Extr: wwp, no edema   Results for orders placed or performed during the hospital encounter of 07/27/21 (from the past 24 hour(s))  Glucose, capillary     Status: Abnormal   Collection Time: 08/21/21 11:11 AM  Result Value Ref Range   Glucose-Capillary 128 (H) 70 - 99 mg/dL  Glucose, capillary     Status: Abnormal   Collection Time: 08/21/21  3:24 PM  Result Value Ref Range   Glucose-Capillary 144 (H) 70 - 99 mg/dL  Glucose, capillary     Status: Abnormal   Collection Time: 08/21/21  8:03 PM  Result Value Ref Range   Glucose-Capillary 135 (H) 70 - 99 mg/dL  Glucose, capillary     Status: Abnormal   Collection Time: 08/21/21 11:48 PM  Result Value Ref Range   Glucose-Capillary 126 (H) 70 - 99 mg/dL  Glucose, capillary     Status: Abnormal   Collection Time: 08/22/21 12:49 AM  Result Value Ref Range   Glucose-Capillary  131 (H) 70 - 99 mg/dL  Glucose, capillary     Status: Abnormal   Collection Time: 08/22/21  3:50 AM  Result Value Ref Range   Glucose-Capillary 135 (H) 70 - 99 mg/dL  Basic metabolic panel     Status: Abnormal   Collection Time: 08/22/21  6:12 AM  Result Value Ref Range   Sodium 136 135 - 145 mmol/L   Potassium 4.3 3.5 - 5.1 mmol/L   Chloride 97 (L) 98 - 111 mmol/L   CO2 31 22 - 32 mmol/L   Glucose, Bld 147 (H) 70 - 99 mg/dL   BUN 20 8 - 23 mg/dL   Creatinine, Ser 1.82 (L) 0.61 - 1.24 mg/dL   Calcium 8.4 (L) 8.9 - 10.3 mg/dL   GFR, Estimated >99 >37 mL/min   Anion gap 8 5 - 15  Glucose, capillary     Status: Abnormal   Collection Time: 08/22/21  8:24 AM  Result Value Ref Range   Glucose-Capillary 145 (H) 70 - 99 mg/dL    Assessment & Plan: The plan of care was discussed with the bedside nurse for the day, who is in agreement with this plan and no additional concerns were raised.   Present on Admission:  Fall    LOS: 26 days   Additional comments:I reviewed the patient's new clinical lab test results.   and I reviewed the patients new imaging  test results.    Fall off ladder   Acute hypoxic ventilator dependent respiratory failure - intubated 11/9, self-extubated and promptly re-intubated 11/12 AM. Extubated and reintubated 11/21. Trach 11/22, now tolerating trach collar with high flow. Needs aggressive pulmonary toilet, frequent suction for secretions. Agitation/delirium - improved, appreciate Psychiatry assist. Klonipin weaned 12/3, plan to discontinue after today, continue librium at current dose. R PTX - resolved R rib fractures - aggressive multimodal pain control, IS, pulm toilet Pelvic fx (L sacrum, R iliac, b/l inf and L sup pubic rami) with pelvic hematoma - ortho c/s, Dr. 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 COPD - prn duonebs, guaifenesin, trach 11/22, see above Hyperglycemia - SSI, glargine 5u, glc  well-controlled Hypokalemia - monitor and replace if needed Anemia - stable, monitor HTN - home lisinopril held for low BP Hx of Hep C FEN: NPO, TF via Cortrak, +BM DVT - SCDs, LMWH Foley - replaced for retention 11/17, 11/22, 11/29 ID - CTX x7d for H flu in 11/16 resp cx, end 11/24; repeat resp cx/CXR sent 11/30 with Serratia and Steno, levaquin x7d  Dispo - 4NP, therapies, expect CIR for dispo  Charles Simas, MD [facility] Surgery General, Hepatobiliary and Pancreatic Surgery 08/22/21 10:06 AM   08/22/2021

## 2021-08-22 NOTE — Progress Notes (Signed)
Pt transferred from 4[facility] right at shift change. When pt arrived to room, his 02 saturations were ranging from 60's to 70's with a good wave. Trach collar 10L at 60% Fi02 at baseline, was bumped to 98% during event. RR in the 40's, and pt pale and in obvious distress. Pt suctioned by ICU RN, Joni Reining, who transported him, and new SP02 sensor placed. Pt's 02 reached as low as 54% despite interventions. RT was called, rapid called, full set of vitals taken. Initial plan was to transfer pt back to ICU. ICU charge RN, Ashok Cordia, secure chatted Dr. Bedelia Person, who gave orders to keep pt in PCU. RT at bedside, suctioned pt and 02 saturations returned to 90's. At end of event, pt left at 97% SP02 with trach collar on 10L at 80% Fi02.   Robina Ade

## 2021-08-23 ENCOUNTER — Inpatient Hospital Stay (HOSPITAL_COMMUNITY): Payer: BC Managed Care – PPO

## 2021-08-23 LAB — BASIC METABOLIC PANEL
Anion gap: 6 (ref 5–15)
BUN: 20 mg/dL (ref 8–23)
CO2: 33 mmol/L — ABNORMAL HIGH (ref 22–32)
Calcium: 8.7 mg/dL — ABNORMAL LOW (ref 8.9–10.3)
Chloride: 97 mmol/L — ABNORMAL LOW (ref 98–111)
Creatinine, Ser: 0.52 mg/dL — ABNORMAL LOW (ref 0.61–1.24)
GFR, Estimated: >60 mL/min (ref >60)
Glucose, Bld: 123 mg/dL — ABNORMAL HIGH (ref 70–99)
Potassium: 3.3 mmol/L — ABNORMAL LOW (ref 3.5–5.1)
Sodium: 136 mmol/L (ref 135–145)

## 2021-08-23 LAB — GLUCOSE, CAPILLARY
Glucose-Capillary: 106 mg/dL — ABNORMAL HIGH (ref 70–99)
Glucose-Capillary: 117 mg/dL — ABNORMAL HIGH (ref 70–99)
Glucose-Capillary: 125 mg/dL — ABNORMAL HIGH (ref 70–99)
Glucose-Capillary: 125 mg/dL — ABNORMAL HIGH (ref 70–99)
Glucose-Capillary: 135 mg/dL — ABNORMAL HIGH (ref 70–99)
Glucose-Capillary: 141 mg/dL — ABNORMAL HIGH (ref 70–99)

## 2021-08-23 MED ORDER — SULFAMETHOXAZOLE-TRIMETHOPRIM 400-80 MG/5ML IV SOLN
10.0000 mg/kg/d | Freq: Three times a day (TID) | INTRAVENOUS | Status: AC
  Administered 2021-08-23 – 2021-08-26 (×11): 319.04 mg via INTRAVENOUS
  Filled 2021-08-23 (×12): qty 19.94

## 2021-08-23 MED ORDER — POTASSIUM CHLORIDE 20 MEQ PO PACK
40.0000 meq | PACK | Freq: Once | ORAL | Status: AC
  Administered 2021-08-23: 40 meq
  Filled 2021-08-23: qty 2

## 2021-08-23 MED ORDER — HYDROCORTISONE 1 % EX CREA
TOPICAL_CREAM | Freq: Three times a day (TID) | CUTANEOUS | Status: DC
  Administered 2021-08-24 – 2021-08-26 (×3): 1 via TOPICAL
  Filled 2021-08-23 (×2): qty 28

## 2021-08-23 NOTE — Progress Notes (Signed)
Physical Therapy Treatment Patient Details Name: [name] MRN: [id number] DOB: 23-Oct-1955 Today's Date: 08/23/2021   History of Present Illness 65 y/o male presented to ED on 07/27/21 after fall off 8 foot ladder. CT chest found small R apical pneumothorax, multiple displaced posterior R rib fxs, nondisplaced R iliac wing fx with widening of R SI joint, displaced fx both inferior pubic rami and L superior pubic ramus, and nondisplaced L sacral fx, SI screw 07/29/21. intubated 11/9, trach 11/22. Trach collar 11/30, back on vent on/off. PMH: COPD, EtOH abuse, HTN, GERD, hep C    PT Comments    Pt was able to work briefly with therapy in bed egress mode, limited by frequent coughing and copious secretions.  PT will continue to follow acutely for safe mobility progression.    Recommendations for follow up therapy are one component of a multi-disciplinary discharge planning process, led by the attending physician.  Recommendations may be updated based on patient status, additional functional criteria and insurance authorization.  Follow Up Recommendations  PT at Long-term acute care hospital     Assistance Recommended at Discharge Frequent or constant Supervision/Assistance  Equipment Recommendations  Hospital bed;Wheelchair (measurements PT);Wheelchair cushion (measurements PT);Other (comment) (hoyer lift)    Recommendations for Other Services       Precautions / Restrictions Precautions Precautions: Fall Precaution Comments: foley, vent via trach, cortrak Restrictions RLE Weight Bearing: Non weight bearing LLE Weight Bearing: Weight bearing as tolerated     Mobility  Bed Mobility Overal bed mobility: Needs Assistance Bed Mobility: Supine to Sit     Supine to sit: Mod assist;HOB elevated     General bed mobility comments: Mod assist to sit momentarily with trunk off of bed pulling with bil UE and supported trunk by PT.  Pt only able to sit momentarily before coughing  fit, popped vent off trach and needed several deep suctions by RN during session due to copious amounts of secreations.    Transfers                        Ambulation/Gait                   Stairs             Wheelchair Mobility    Modified Rankin (Stroke Patients Only)       Balance Overall balance assessment: Needs assistance Sitting-balance support: Bilateral upper extremity supported Sitting balance-Leahy Scale: Poor Sitting balance - Comments: bil UE support and trunk supported by PT.                                    Cognition Arousal/Alertness: Awake/alert Behavior During Therapy: Anxious;Restless Overall Cognitive Status: Difficult to assess                     Current Attention Level: Sustained           General Comments: Pt vented, restless, following commands, distracted by near constant coughing.        Exercises General Exercises - Upper Extremity Shoulder Flexion: AAROM;Both;10 reps Elbow Flexion: AAROM;Both;10 reps General Exercises - Lower Extremity Ankle Circles/Pumps: AAROM;Both;10 reps Long Arc Quad: AAROM;Both;10 reps Heel Slides: AAROM;Both;10 reps Hip ABduction/ADduction: AAROM;Both;10 reps    General Comments General comments (skin integrity, edema, etc.): Vent PRVC FiO2 40% PEEP 5      Pertinent Vitals/Pain  Pain Assessment: Faces Faces Pain Scale: Hurts little more Facial Expression: Relaxed, neutral Body Movements: Restlessness Muscle Tension: Tense, rigid Compliance with ventilator (intubated pts.): Tolerating ventilator or movement Vocalization (extubated pts.): N/A CPOT Total: 3 Pain Location: generalized Pain Descriptors / Indicators: Discomfort Pain Intervention(s): Limited activity within patient's tolerance;Monitored during session;Repositioned    Home Living                          Prior Function            PT Goals (current goals can now be found in  the care plan section) Acute Rehab PT Goals Patient Stated Goal: unable to state, vented Progress towards PT goals: Progressing toward goals    Frequency    Min 3X/week      PT Plan Current plan remains appropriate    Co-evaluation              AM-PAC PT "6 Clicks" Mobility   Outcome Measure  Help needed turning from your back to your side while in a flat bed without using bedrails?: Total Help needed moving from lying on your back to sitting on the side of a flat bed without using bedrails?: Total Help needed moving to and from a bed to a chair (including a wheelchair)?: Total Help needed standing up from a chair using your arms (e.g., wheelchair or bedside chair)?: Total Help needed to walk in hospital room?: Total Help needed climbing 3-5 steps with a railing? : Total 6 Click Score: 6    End of Session Equipment Utilized During Treatment: Oxygen Activity Tolerance: Treatment limited secondary to medical complications (Comment) (limited by copious secreations and coughing.) Patient left: in bed;with call bell/phone within reach;with bed alarm set   PT Visit Diagnosis: Muscle weakness (generalized) (M62.81);Pain;Unsteadiness on feet (R26.81) Pain - Right/Left: Right Pain - part of body: Hip     Time: 8466-5993 PT Time Calculation (min) (ACUTE ONLY): 20 min  Charges:  $Therapeutic Activity: 8-22 mins                     Corinna Capra, PT, DPT  Acute Rehabilitation Ortho Tech Supervisor [phone] pager [phone]) [phone] office

## 2021-08-23 NOTE — Progress Notes (Signed)
Pt placed on trach collar by RT. Within approximately 20 minutes, pt's RR increased to the 40s. He was suctioned him twice. He became more agitated. RT placed him back on the ventilator.

## 2021-08-23 NOTE — Procedures (Signed)
Cortrak  Person Inserting Tube:  Conswella Bruney E, RD Tube Type:  Cortrak - 43 inches Tube Size:  10 Tube Location:  Left nare Initial Placement:  Stomach Secured by: Bridle Technique Used to Measure Tube Placement:  Marking at nare/corner of mouth Cortrak Secured At:  70 cm  Cortrak Tube Team Note:  Consult received to place a Cortrak feeding tube.   X-ray is required, abdominal x-ray has been ordered by the Cortrak team. Please confirm tube placement before using the Cortrak tube.   If the tube becomes dislodged please keep the tube and contact the Cortrak team at [email or web address] (password [email or web address]) for replacement.  If after hours and replacement cannot be delayed, place a NG tube and confirm placement with an abdominal x-ray.     Aylin Rhoads A., MS, RD, LDN (she/her/hers) RD pager number and weekend/on-call pager number located in Amion.   

## 2021-08-23 NOTE — Progress Notes (Signed)
Patient ID: [name], male   DOB: February 22, 1956, 65 y.o.   MRN: [id number] Follow up - Trauma Critical Care  Patient Details:    [name] Brewer is an 65 y.o. male.  Lines/tubes : NG/OG Vented/Dual Lumen 16 Fr. Left nare (Active)  Tube Position (Required) Marking at nare/corner of mouth 08/22/21 2000  Measurement (cm) (Required) 48 cm 08/22/21 2000  Ongoing Placement Verification (Required) (See row information) Yes 08/22/21 2000  Site Assessment Clean;Dry;Intact 08/22/21 2000  Interventions Retaped 08/22/21 2000  Status Feeding 08/22/21 2000  Intake (mL) 50 mL 08/23/21 0600     Urethral Catheter [name], RN 16 Fr. (Active)  Indication for Insertion or Continuance of Catheter Acute urinary retention (I&O Cath for 24 hrs prior to catheter insertion- Inpatient Only) 08/22/21 2000  Site Assessment Clean;Intact;Swelling 08/22/21 2000  Catheter Maintenance Bag below level of bladder;Catheter secured;Drainage bag/tubing not touching floor;Insertion date on drainage bag;No dependent loops;Seal intact 08/22/21 2000  Collection Container Standard drainage bag 08/22/21 2000  Securement Method Securing device (Describe) 08/22/21 2000  Urinary Catheter Interventions (if applicable) Unclamped 08/22/21 1300  Output (mL) 550 mL 08/23/21 0600    Microbiology/Sepsis markers: Results for orders placed or performed during the hospital encounter of 07/27/21  Resp Panel by RT-PCR (Flu A&B, Covid) Nasopharyngeal Swab     Status: None   Collection Time: 07/27/21  3:34 PM   Specimen: Nasopharyngeal Swab; Nasopharyngeal(NP) swabs in vial transport medium  Result Value Ref Range Status   SARS Coronavirus 2 by RT PCR NEGATIVE NEGATIVE Final    Comment: (NOTE) SARS-CoV-2 target nucleic acids are NOT DETECTED.  The SARS-CoV-2 RNA is generally detectable in upper respiratory specimens during the acute phase of infection. The lowest concentration of SARS-CoV-2 viral copies this assay can detect  is 138 copies/mL. A negative result does not preclude SARS-Cov-2 infection and should not be used as the sole basis for treatment or other patient management decisions. A negative result may occur with  improper specimen collection/handling, submission of specimen other than nasopharyngeal swab, presence of viral mutation(s) within the areas targeted by this assay, and inadequate number of viral copies(<138 copies/mL). A negative result must be combined with clinical observations, patient history, and epidemiological information. The expected result is Negative.  Fact Sheet for Patients:  BloggerCourse.com  Fact Sheet for Healthcare Providers:  SeriousBroker.it  This test is no t yet approved or cleared by the [place] FDA and  has been authorized for detection and/or diagnosis of SARS-CoV-2 by FDA under an Emergency Use Authorization (EUA). This EUA will remain  in effect (meaning this test can be used) for the duration of the COVID-19 declaration under Section 564(b)(1) of the Act, 21 U.S.C.section 360bbb-3(b)(1), unless the authorization is terminated  or revoked sooner.       Influenza A by PCR NEGATIVE NEGATIVE Final   Influenza B by PCR NEGATIVE NEGATIVE Final    Comment: (NOTE) The Xpert Xpress SARS-CoV-2/FLU/RSV plus assay is intended as an aid in the diagnosis of influenza from Nasopharyngeal swab specimens and should not be used as a sole basis for treatment. Nasal washings and aspirates are unacceptable for Xpert Xpress SARS-CoV-2/FLU/RSV testing.  Fact Sheet for Patients: BloggerCourse.com  Fact Sheet for Healthcare Providers: SeriousBroker.it  This test is not yet approved or cleared by the [place] FDA and has been authorized for detection and/or diagnosis of SARS-CoV-2 by FDA under an Emergency Use Authorization (EUA). This EUA will remain in effect  (meaning this test  can be used) for the duration of the COVID-19 declaration under Section 564(b)(1) of the Act, 21 U.S.C. section 360bbb-3(b)(1), unless the authorization is terminated or revoked.  Performed at [facility], [address] [place], [place] [address]   Surgical PCR screen     Status: None   Collection Time: 07/27/21  5:54 PM   Specimen: Nasal Mucosa; Nasal Swab  Result Value Ref Range Status   MRSA, PCR NEGATIVE NEGATIVE Final   Staphylococcus aureus NEGATIVE NEGATIVE Final    Comment: (NOTE) The Xpert SA Assay (FDA approved for NASAL specimens in patients 21 years of age and older), is one component of a comprehensive surveillance program. It is not intended to diagnose infection nor to guide or monitor treatment. Performed at [facility], [address], [place], [place] [address]   Culture, Respiratory w Gram Stain     Status: None   Collection Time: 08/04/21 11:53 AM   Specimen: Tracheal Aspirate; Respiratory  Result Value Ref Range Status   Specimen Description TRACHEAL ASPIRATE  Final   Special Requests NONE  Final   Gram Stain   Final    FEW SQUAMOUS EPITHELIAL CELLS PRESENT FEW WBC SEEN FEW GRAM POSITIVE COCCI MODERATE GRAM NEGATIVE RODS    Culture   Final    ABUNDANT HAEMOPHILUS INFLUENZAE BETA LACTAMASE NEGATIVE Performed at [facility], [address] [place], [place] [address]    Report Status 08/05/2021 FINAL  Final  Culture, Respiratory w Gram Stain     Status: None   Collection Time: 08/18/21  8:22 AM   Specimen: Tracheal Aspirate; Respiratory  Result Value Ref Range Status   Specimen Description TRACHEAL ASPIRATE  Final   Special Requests NONE  Final   Gram Stain   Final    NO WBC SEEN FEW GRAM VARIABLE ROD RARE GRAM POSITIVE COCCI IN PAIRS Performed at [facility], [address] [place], [place] [address]    Culture   Final    MODERATE SERRATIA MARCESCENS MODERATE STENOTROPHOMONAS MALTOPHILIA     Report Status 08/21/2021 FINAL  Final   Organism ID, Bacteria SERRATIA MARCESCENS  Final   Organism ID, Bacteria STENOTROPHOMONAS MALTOPHILIA  Final      Susceptibility   Stenotrophomonas maltophilia - MIC*    LEVOFLOXACIN 0.5 SENSITIVE Sensitive     TRIMETH/SULFA <=20 SENSITIVE Sensitive     * MODERATE STENOTROPHOMONAS MALTOPHILIA   Serratia marcescens - MIC*    CEFAZOLIN >=64 RESISTANT Resistant     CEFEPIME <=0.12 SENSITIVE Sensitive     CEFTAZIDIME <=1 SENSITIVE Sensitive     CEFTRIAXONE <=0.25 SENSITIVE Sensitive     CIPROFLOXACIN <=0.25 SENSITIVE Sensitive     GENTAMICIN <=1 SENSITIVE Sensitive     TRIMETH/SULFA <=20 SENSITIVE Sensitive     * MODERATE SERRATIA MARCESCENS    Anti-infectives:  Anti-infectives (From admission, onward)    Start     Dose/Rate Route Frequency Ordered Stop   08/20/21 1215  levofloxacin (LEVAQUIN) IVPB 750 mg        750 mg 100 mL/hr over 90 Minutes Intravenous Daily 08/20/21 1126     08/06/21 0915  cefTRIAXone (ROCEPHIN) 2 g in sodium chloride 0.9 % 100 mL IVPB        2 g 200 mL/hr over 30 Minutes Intravenous Every 24 hours 08/06/21 0818 08/12/21 1103   07/29/21 1800  ceFAZolin (ANCEF) IVPB 2g/100 mL premix        2 g 200 mL/hr over 30  Minutes Intravenous Every 8 hours 07/29/21 1241 07/30/21 1112   07/28/21 0815  ceFAZolin (ANCEF) IVPB 2g/100 mL premix  Status:  Discontinued        2 g 200 mL/hr over 30 Minutes Intravenous On call to O.R. 07/27/21 1744 07/28/21 0900       Best Practice/Protocols:  VTE Prophylaxis: Lovenox (prophylaxtic dose) Intermittent Sedation  Consults: Treatment Team:  Myrene Galas, MD    Studies:    Events:  Subjective:    Overnight Issues:   Objective:  Vital signs for last 24 hours: Temp:  [98.3 F (36.8 C)-100.6 F (38.1 C)] 100.3 F (37.9 C) (12/05 0800) Pulse Rate:  [89-121] 92 (12/05 0700) Resp:  [14-39] 16 (12/05 0700) BP: (98-152)/(65-97) 121/82 (12/05 0700) SpO2:  [90 %-100 %] 96 %  (12/05 0700) FiO2 (%):  [40 %-80 %] 40 % (12/05 0309) Weight:  [323 kg] 116 kg (12/05 0500)  Hemodynamic parameters for last 24 hours:    Intake/Output from previous day: 12/04 0701 - 12/05 0700 In: 1010 [NG/GT:1010] Out: 4250 [Urine:4250]  Intake/Output this shift: No intake/output data recorded.  Vent settings for last 24 hours: Vent Mode: PRVC FiO2 (%):  [40 %-80 %] 40 % Set Rate:  [16 bmp] 16 bmp Vt Set:  [650 mL] 650 mL PEEP:  [5 cmH20] 5 cmH20 Plateau Pressure:  [16 cmH20-24 cmH20] 20 cmH20  Physical Exam:  General: alert on vent Neuro: F/C HEENT/Neck: trach in place, a lot of secretions Resp: some rhonchi CVS: RRR GI: soft, NT, ND Extremities: edema 1+  Results for orders placed or performed during the hospital encounter of 07/27/21 (from the past 24 hour(s))  Glucose, capillary     Status: Abnormal   Collection Time: 08/22/21 11:35 AM  Result Value Ref Range   Glucose-Capillary 138 (H) 70 - 99 mg/dL  CBC     Status: Abnormal   Collection Time: 08/22/21  1:10 PM  Result Value Ref Range   WBC 5.2 4.0 - 10.5 K/uL   RBC 2.80 (L) 4.22 - 5.81 MIL/uL   Hemoglobin 9.0 (L) 13.0 - 17.0 g/dL   HCT 55.7 (L) 32.2 - 02.5 %   MCV 107.5 (H) 80.0 - 100.0 fL   MCH 32.1 26.0 - 34.0 pg   MCHC 29.9 (L) 30.0 - 36.0 g/dL   RDW 42.7 06.2 - 37.6 %   Platelets 257 150 - 400 K/uL   nRBC 0.0 0.0 - 0.2 %  Glucose, capillary     Status: Abnormal   Collection Time: 08/22/21  1:18 PM  Result Value Ref Range   Glucose-Capillary 129 (H) 70 - 99 mg/dL  Glucose, capillary     Status: Abnormal   Collection Time: 08/22/21  3:03 PM  Result Value Ref Range   Glucose-Capillary 133 (H) 70 - 99 mg/dL  Glucose, capillary     Status: Abnormal   Collection Time: 08/22/21  7:44 PM  Result Value Ref Range   Glucose-Capillary 118 (H) 70 - 99 mg/dL  Glucose, capillary     Status: Abnormal   Collection Time: 08/22/21 11:19 PM  Result Value Ref Range   Glucose-Capillary 140 (H) 70 - 99 mg/dL   Glucose, capillary     Status: Abnormal   Collection Time: 08/23/21  3:25 AM  Result Value Ref Range   Glucose-Capillary 117 (H) 70 - 99 mg/dL  Basic metabolic panel     Status: Abnormal   Collection Time: 08/23/21  3:56 AM  Result Value Ref Range  Sodium 136 135 - 145 mmol/L   Potassium 3.3 (L) 3.5 - 5.1 mmol/L   Chloride 97 (L) 98 - 111 mmol/L   CO2 33 (H) 22 - 32 mmol/L   Glucose, Bld 123 (H) 70 - 99 mg/dL   BUN 20 8 - 23 mg/dL   Creatinine, Ser 6.71 (L) 0.61 - 1.24 mg/dL   Calcium 8.7 (L) 8.9 - 10.3 mg/dL   GFR, Estimated >24 >58 mL/min   Anion gap 6 5 - 15  Glucose, capillary     Status: Abnormal   Collection Time: 08/23/21  8:08 AM  Result Value Ref Range   Glucose-Capillary 125 (H) 70 - 99 mg/dL    Assessment & Plan: Present on Admission:  Fall    LOS: 27 days   Additional comments:I reviewed the patient's new clinical lab test results. . Fall off ladder   Acute hypoxic ventilator dependent respiratory failure - intubated 11/9, self-extubated and promptly re-intubated 11/12 AM. Extubated and reintubated 11/21. Trach 11/22, now back on vent, continue robinul and guaifenesin Agitation/delirium - improved, appreciate Psychiatry assist. Klonipin weaned but still needs current dose, continue librium at current dose. R PTX - resolved R rib fractures - aggressive multimodal pain control, IS, pulm toilet Pelvic fx (L sacrum, R iliac, b/l inf and L sup pubic rami) with pelvic hematoma - ortho c/s, Dr. 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 COPD - prn duonebs, guaifenesin, trach 11/22, see above Hyperglycemia - SSI, glargine 5u, glc well-controlled Hypokalemia - monitor and replace if needed Anemia - stable, monitor HTN - home lisinopril held for low BP Hx of Hep C FEN: NPO, replace cortrak, +BM DVT - SCDs, LMWH Foley - replaced for retention 11/17, 11/22, 11/29 ID - CTX x7d for H flu in 11/16 resp cx, end 11/24; repeat resp  cx/CXR sent 11/30 with Serratia and Steno, levaquin x7d  Dispo - ICU, back on vent - HTC as able, expect CIR for dispo  Critical Care Total Time*: 35 Minutes  Violeta Gelinas, MD, MPH, FACS Trauma & General Surgery Use [email or web address] to contact on call provider  08/23/2021  *Care during the described time interval was provided by me. I have reviewed this patient's available data, including medical history, events of note, physical examination and test results as part of my evaluation.

## 2021-08-23 NOTE — Progress Notes (Signed)
Pt returned to full support on the ventilator after being on trach collar for 30 minutes. Pt with desaturation and increased RR/work of breathing despite suctioning. RT will continue to monitor and be available as needed.

## 2021-08-23 NOTE — Progress Notes (Signed)
SLP Cancellation Note  Patient Details Name: [name] MRN: [id number] DOB: 1956-04-22   Cancelled treatment:       Reason Eval/Treat Not Completed: Patient not medically ready. Did not tolerate ATC well today due to secretions. Back on vent   Millicent Blazejewski, [name] 08/23/2021, 1:53 PM

## 2021-08-24 LAB — CBC
HCT: 27.7 % — ABNORMAL LOW (ref 39.0–52.0)
Hemoglobin: 8.6 g/dL — ABNORMAL LOW (ref 13.0–17.0)
MCH: 32.1 pg (ref 26.0–34.0)
MCHC: 31 g/dL (ref 30.0–36.0)
MCV: 103.4 fL — ABNORMAL HIGH (ref 80.0–100.0)
Platelets: 227 10*3/uL (ref 150–400)
RBC: 2.68 MIL/uL — ABNORMAL LOW (ref 4.22–5.81)
RDW: 13.8 % (ref 11.5–15.5)
WBC: 4.9 10*3/uL (ref 4.0–10.5)
nRBC: 0 % (ref 0.0–0.2)

## 2021-08-24 LAB — BASIC METABOLIC PANEL
Anion gap: 9 (ref 5–15)
BUN: 16 mg/dL (ref 8–23)
CO2: 29 mmol/L (ref 22–32)
Calcium: 8.8 mg/dL — ABNORMAL LOW (ref 8.9–10.3)
Chloride: 99 mmol/L (ref 98–111)
Creatinine, Ser: 0.68 mg/dL (ref 0.61–1.24)
GFR, Estimated: >60 mL/min (ref >60)
Glucose, Bld: 122 mg/dL — ABNORMAL HIGH (ref 70–99)
Potassium: 3.6 mmol/L (ref 3.5–5.1)
Sodium: 137 mmol/L (ref 135–145)

## 2021-08-24 LAB — GLUCOSE, CAPILLARY
Glucose-Capillary: 123 mg/dL — ABNORMAL HIGH (ref 70–99)
Glucose-Capillary: 125 mg/dL — ABNORMAL HIGH (ref 70–99)
Glucose-Capillary: 129 mg/dL — ABNORMAL HIGH (ref 70–99)
Glucose-Capillary: 131 mg/dL — ABNORMAL HIGH (ref 70–99)
Glucose-Capillary: 135 mg/dL — ABNORMAL HIGH (ref 70–99)
Glucose-Capillary: 159 mg/dL — ABNORMAL HIGH (ref 70–99)

## 2021-08-24 MED ORDER — QUETIAPINE FUMARATE 100 MG PO TABS: 100.0000 mg | ORAL_TABLET | Freq: Every day | ORAL | Status: DC

## 2021-08-24 MED ORDER — QUETIAPINE FUMARATE 200 MG PO TABS
200.0000 mg | ORAL_TABLET | Freq: Every day | ORAL | Status: DC
  Administered 2021-08-24 – 2021-08-29 (×6): 200 mg
  Filled 2021-08-24 (×6): qty 1

## 2021-08-24 MED ORDER — CHLORDIAZEPOXIDE HCL 5 MG PO CAPS
10.0000 mg | ORAL_CAPSULE | Freq: Three times a day (TID) | ORAL | Status: AC
  Administered 2021-08-24: 10 mg
  Filled 2021-08-24: qty 2

## 2021-08-24 MED ORDER — QUETIAPINE FUMARATE 100 MG PO TABS: 100.0000 mg | ORAL_TABLET | Freq: Two times a day (BID) | ORAL | Status: DC

## 2021-08-24 MED ORDER — CHLORDIAZEPOXIDE HCL 5 MG PO CAPS: 5.0000 mg | ORAL_CAPSULE | Freq: Two times a day (BID) | ORAL | Status: DC

## 2021-08-24 MED ORDER — QUETIAPINE FUMARATE 100 MG PO TABS
100.0000 mg | ORAL_TABLET | Freq: Every morning | ORAL | Status: DC
  Administered 2021-08-25 – 2021-08-30 (×6): 100 mg
  Filled 2021-08-24 (×6): qty 1

## 2021-08-24 NOTE — Progress Notes (Incomplete)
Occupational Therapy Treatment Note

## 2021-08-24 NOTE — Progress Notes (Signed)
Nutrition Follow-up  DOCUMENTATION CODES:   Not applicable  INTERVENTION:   Tube feeding via Cortrak: Jevity 1.5 at 65 ml/h (1560 ml per day) Prosource TF 90 ml BID  Provides 2500 kcal, 143 gm protein, 1185 ml free water daily  Free water: 250 ml every 6 hours Total free water: 2185 ml    NUTRITION DIAGNOSIS:   Increased nutrient needs related to post-op healing as evidenced by estimated needs. Ongoing.   GOAL:   Patient will meet greater than or equal to 90% of their needs Met.   MONITOR:   TF tolerance  REASON FOR ASSESSMENT:   Consult, Ventilator Enteral/tube feeding initiation and management  ASSESSMENT:   Pt with PMH of ETOH abuse, COPD, HTN, GERD, and hepatitis C admitted after fall from ladder with R PTX s/p CT, R rib fxs, and pelvic fx s/p fixation.   Pt discussed during ICU rounds and with RN.  Pt currently on trach collar. Pt has been on/off TC/vent. Per case manager looking at [facility] at d/c. Daughter at bedside. Pt sitting up in bed, states he does not feel hungry.    11/10 s/p screw fixation of pelvic ring  11/12 self-extubated and re-intubated 11/18 cortrak placed; tip gastric  11/21 extubated; re-intubated 11/22 trach today 12/3 tx to step down 12/4 tx back to ICU, required vent support due to secretions 12/5 s/p cortrak placement; gastric    Medications reviewed and include: vitamin D3, folic acid, SSI, Semglee, MVI with minerals, thiamine   Labs reviewed: Vitamin D: 21.5 CBG's: 106-141   Diet Order:   Diet Order             Diet NPO time specified  Diet effective now                   EDUCATION NEEDS:   Not appropriate for education at this time  Skin:  Skin Assessment:  (R hip and neck incisions, skin tear buttocks)  Last BM:  11/25, rectal tube removed; no bm x 4 days  Height:   Ht Readings from Last 1 Encounters:  07/27/21 _0  (1.88 m)    Weight:   Wt Readings from Last 1 Encounters:  08/24/21 110 kg     BMI:  Body mass index is 31.14 kg/m.  Estimated Nutritional Needs:   Kcal:  2400-2600  Protein:  135-145 grams  Fluid:  > 2 L/day  Lockie Pares., RD, LDN, CNSC See AMiON for contact information

## 2021-08-24 NOTE — Progress Notes (Signed)
Patient ID: [name], male   DOB: 03-11-1956, 65 y.o.   MRN: [id number] Follow up - Trauma Critical Care  Patient Details:    [name] is an 65 y.o. male.  Lines/tubes : Urethral Catheter [name], RN 16 Fr. (Active)  Indication for Insertion or Continuance of Catheter Acute urinary retention (I&O Cath for 24 hrs prior to catheter insertion- Inpatient Only) 08/24/21 0736  Site Assessment Clean;Intact 08/24/21 0736  Catheter Maintenance Bag below level of bladder;Catheter secured;Insertion date on drainage bag;No dependent loops;Drainage bag/tubing not touching floor;Seal intact;Bag emptied prior to transport 08/24/21 0736  Collection Container Standard drainage bag 08/24/21 0736  Securement Method Securing device (Describe) 08/24/21 0736  Urinary Catheter Interventions (if applicable) Unclamped 08/24/21 0736  Output (mL) 350 mL 08/24/21 1200    Microbiology/Sepsis markers: Results for orders placed or performed during the hospital encounter of 07/27/21  Resp Panel by RT-PCR (Flu A&B, Covid) Nasopharyngeal Swab     Status: None   Collection Time: 07/27/21  3:34 PM   Specimen: Nasopharyngeal Swab; Nasopharyngeal(NP) swabs in vial transport medium  Result Value Ref Range Status   SARS Coronavirus 2 by RT PCR NEGATIVE NEGATIVE Final    Comment: (NOTE) SARS-CoV-2 target nucleic acids are NOT DETECTED.  The SARS-CoV-2 RNA is generally detectable in upper respiratory specimens during the acute phase of infection. The lowest concentration of SARS-CoV-2 viral copies this assay can detect is 138 copies/mL. A negative result does not preclude SARS-Cov-2 infection and should not be used as the sole basis for treatment or other patient management decisions. A negative result may occur with  improper specimen collection/handling, submission of specimen other than nasopharyngeal swab, presence of viral mutation(s) within the areas targeted by this assay, and inadequate  number of viral copies(<138 copies/mL). A negative result must be combined with clinical observations, patient history, and epidemiological information. The expected result is Negative.  Fact Sheet for Patients:  BloggerCourse.com  Fact Sheet for Healthcare Providers:  SeriousBroker.it  This test is no t yet approved or cleared by the [place] FDA and  has been authorized for detection and/or diagnosis of SARS-CoV-2 by FDA under an Emergency Use Authorization (EUA). This EUA will remain  in effect (meaning this test can be used) for the duration of the COVID-19 declaration under Section 564(b)(1) of the Act, 21 U.S.C.section 360bbb-3(b)(1), unless the authorization is terminated  or revoked sooner.       Influenza A by PCR NEGATIVE NEGATIVE Final   Influenza B by PCR NEGATIVE NEGATIVE Final    Comment: (NOTE) The Xpert Xpress SARS-CoV-2/FLU/RSV plus assay is intended as an aid in the diagnosis of influenza from Nasopharyngeal swab specimens and should not be used as a sole basis for treatment. Nasal washings and aspirates are unacceptable for Xpert Xpress SARS-CoV-2/FLU/RSV testing.  Fact Sheet for Patients: BloggerCourse.com  Fact Sheet for Healthcare Providers: SeriousBroker.it  This test is not yet approved or cleared by the [place] FDA and has been authorized for detection and/or diagnosis of SARS-CoV-2 by FDA under an Emergency Use Authorization (EUA). This EUA will remain in effect (meaning this test can be used) for the duration of the COVID-19 declaration under Section 564(b)(1) of the Act, 21 U.S.C. section 360bbb-3(b)(1), unless the authorization is terminated or revoked.  Performed at [facility], [address] [place], [place] [address]   Surgical PCR screen     Status: None   Collection Time: 07/27/21  5:54 PM  Specimen: Nasal Mucosa;  Nasal Swab  Result Value Ref Range Status   MRSA, PCR NEGATIVE NEGATIVE Final   Staphylococcus aureus NEGATIVE NEGATIVE Final    Comment: (NOTE) The Xpert SA Assay (FDA approved for NASAL specimens in patients 27 years of age and older), is one component of a comprehensive surveillance program. It is not intended to diagnose infection nor to guide or monitor treatment. Performed at [facility], [address], [place], [place] [address]   Culture, Respiratory w Gram Stain     Status: None   Collection Time: 08/04/21 11:53 AM   Specimen: Tracheal Aspirate; Respiratory  Result Value Ref Range Status   Specimen Description TRACHEAL ASPIRATE  Final   Special Requests NONE  Final   Gram Stain   Final    FEW SQUAMOUS EPITHELIAL CELLS PRESENT FEW WBC SEEN FEW GRAM POSITIVE COCCI MODERATE GRAM NEGATIVE RODS    Culture   Final    ABUNDANT HAEMOPHILUS INFLUENZAE BETA LACTAMASE NEGATIVE Performed at [facility], [address], [place], [place] [address]    Report Status 08/05/2021 FINAL  Final  Culture, Respiratory w Gram Stain     Status: None   Collection Time: 08/18/21  8:22 AM   Specimen: Tracheal Aspirate; Respiratory  Result Value Ref Range Status   Specimen Description TRACHEAL ASPIRATE  Final   Special Requests NONE  Final   Gram Stain   Final    NO WBC SEEN FEW GRAM VARIABLE ROD RARE GRAM POSITIVE COCCI IN PAIRS Performed at [facility], [address] [place], [place] [address]    Culture   Final    MODERATE SERRATIA MARCESCENS MODERATE STENOTROPHOMONAS MALTOPHILIA    Report Status 08/21/2021 FINAL  Final   Organism ID, Bacteria SERRATIA MARCESCENS  Final   Organism ID, Bacteria STENOTROPHOMONAS MALTOPHILIA  Final      Susceptibility   Stenotrophomonas maltophilia - MIC*    LEVOFLOXACIN 0.5 SENSITIVE Sensitive     TRIMETH/SULFA <=20 SENSITIVE Sensitive     * MODERATE STENOTROPHOMONAS MALTOPHILIA   Serratia marcescens - MIC*    CEFAZOLIN  >=64 RESISTANT Resistant     CEFEPIME <=0.12 SENSITIVE Sensitive     CEFTAZIDIME <=1 SENSITIVE Sensitive     CEFTRIAXONE <=0.25 SENSITIVE Sensitive     CIPROFLOXACIN <=0.25 SENSITIVE Sensitive     GENTAMICIN <=1 SENSITIVE Sensitive     TRIMETH/SULFA <=20 SENSITIVE Sensitive     * MODERATE SERRATIA MARCESCENS    Anti-infectives:  Anti-infectives (From admission, onward)    Start     Dose/Rate Route Frequency Ordered Stop   08/23/21 1630  sulfamethoxazole-trimethoprim (BACTRIM) 319.04 mg in dextrose 5 % 500 mL IVPB        10 mg/kg/day  95.7 kg (Adjusted) 346.6 mL/hr over 90 Minutes Intravenous Every 8 hours 08/23/21 1531     08/20/21 1215  levofloxacin (LEVAQUIN) IVPB 750 mg  Status:  Discontinued        750 mg 100 mL/hr over 90 Minutes Intravenous Daily 08/20/21 1126 08/23/21 1531   08/06/21 0915  cefTRIAXone (ROCEPHIN) 2 g in sodium chloride 0.9 % 100 mL IVPB        2 g 200 mL/hr over 30 Minutes Intravenous Every 24 hours 08/06/21 0818 08/12/21 1103   07/29/21 1800  ceFAZolin (ANCEF) IVPB 2g/100 mL premix        2 g 200 mL/hr over 30 Minutes Intravenous Every 8 hours 07/29/21 1241 07/30/21 1112   07/28/21 0815  ceFAZolin (  ANCEF) IVPB 2g/100 mL premix  Status:  Discontinued        2 g 200 mL/hr over 30 Minutes Intravenous On call to O.R. 07/27/21 1744 07/28/21 0900       Best Practice/Protocols:  VTE Prophylaxis: Lovenox (prophylaxtic dose) Intermittent Sedation  Consults: Treatment Team:  Myrene Galas, MD    Studies:    Events:  Subjective:    Overnight Issues:   Objective:  Vital signs for last 24 hours: Temp:  [98.1 F (36.7 C)-100.2 F (37.9 C)] 98.1 F (36.7 C) (12/06 1200) Pulse Rate:  [88-121] 109 (12/06 1200) Resp:  [15-28] 25 (12/06 1200) BP: (110-162)/(65-115) 133/88 (12/06 1200) SpO2:  [92 %-99 %] 93 % (12/06 1200) FiO2 (%):  [40 %] 40 % (12/06 1130) Weight:  [110 kg] 110 kg (12/06 0500)  Hemodynamic parameters for last 24 hours:     Intake/Output from previous day: 12/05 0701 - 12/06 0700 In: 6168.7 [NG/GT:4180; IV Piggyback:1988.7] Out: 3200 [Urine:3200]  Intake/Output this shift: Total I/O In: 325 [NG/GT:325] Out: 1150 [Urine:1150]  Vent settings for last 24 hours: Vent Mode: PRVC FiO2 (%):  [40 %] 40 % Set Rate:  [16 bmp] 16 bmp Vt Set:  [650 mL] 650 mL PEEP:  [5 cmH20] 5 cmH20 Plateau Pressure:  [21 cmH20-24 cmH20] 21 cmH20  Physical Exam:  General: on HTC Neuro: awake, some anxiety, spoke with PMV HEENT/Neck: trach, a lot of secretions Resp: rhonchi bilaterally CVS: RRR GI: soft, NT, ND Extremities: edema 1+  Results for orders placed or performed during the hospital encounter of 07/27/21 (from the past 24 hour(s))  Glucose, capillary     Status: Abnormal   Collection Time: 08/23/21  3:56 PM  Result Value Ref Range   Glucose-Capillary 106 (H) 70 - 99 mg/dL  Glucose, capillary     Status: Abnormal   Collection Time: 08/23/21  7:30 PM  Result Value Ref Range   Glucose-Capillary 135 (H) 70 - 99 mg/dL  Glucose, capillary     Status: Abnormal   Collection Time: 08/23/21 11:31 PM  Result Value Ref Range   Glucose-Capillary 141 (H) 70 - 99 mg/dL  Glucose, capillary     Status: Abnormal   Collection Time: 08/24/21  3:29 AM  Result Value Ref Range   Glucose-Capillary 129 (H) 70 - 99 mg/dL  Basic metabolic panel     Status: Abnormal   Collection Time: 08/24/21  3:38 AM  Result Value Ref Range   Sodium 137 135 - 145 mmol/L   Potassium 3.6 3.5 - 5.1 mmol/L   Chloride 99 98 - 111 mmol/L   CO2 29 22 - 32 mmol/L   Glucose, Bld 122 (H) 70 - 99 mg/dL   BUN 16 8 - 23 mg/dL   Creatinine, Ser 0.98 0.61 - 1.24 mg/dL   Calcium 8.8 (L) 8.9 - 10.3 mg/dL   GFR, Estimated >11 >91 mL/min   Anion gap 9 5 - 15  CBC     Status: Abnormal   Collection Time: 08/24/21  3:38 AM  Result Value Ref Range   WBC 4.9 4.0 - 10.5 K/uL   RBC 2.68 (L) 4.22 - 5.81 MIL/uL   Hemoglobin 8.6 (L) 13.0 - 17.0 g/dL   HCT 47.8  (L) 29.5 - 52.0 %   MCV 103.4 (H) 80.0 - 100.0 fL   MCH 32.1 26.0 - 34.0 pg   MCHC 31.0 30.0 - 36.0 g/dL   RDW 62.1 30.8 - 65.7 %   Platelets 227 150 -  400 K/uL   nRBC 0.0 0.0 - 0.2 %  Glucose, capillary     Status: Abnormal   Collection Time: 08/24/21  8:09 AM  Result Value Ref Range   Glucose-Capillary 125 (H) 70 - 99 mg/dL  Glucose, capillary     Status: Abnormal   Collection Time: 08/24/21 11:16 AM  Result Value Ref Range   Glucose-Capillary 123 (H) 70 - 99 mg/dL    Assessment & Plan: Present on Admission:  Fall    LOS: 28 days   Additional comments:I reviewed the patient's new clinical lab test results. . Fall off ladder   Acute hypoxic ventilator dependent respiratory failure - intubated 11/9, self-extubated and promptly re-intubated 11/12 AM. Extubated and reintubated 11/21. Trach 11/22, now back on vent, continue robinul and guaifenesin. HTC as able (on it now)- in line PMV Agitation/delirium - improved, appreciate Psychiatry assist and I D/W them at the bedside today. R PTX - resolved R rib fractures - aggressive multimodal pain control, IS, pulm toilet Pelvic fx (L sacrum, R iliac, b/l inf and L sup pubic rami) with pelvic hematoma - ortho c/s, Dr. 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 COPD - prn duonebs, guaifenesin, trach 11/22, see above Hyperglycemia - SSI, glargine 5u, glc well-controlled Hypokalemia - monitor and replace if needed Anemia - stable, monitor HTN - home lisinopril held for low BP Hx of Hep C FEN: NPO, replace cortrak, +BM DVT - SCDs, LMWH Foley - replaced for retention 11/17, 11/22, 11/29 ID - CTX x7d for H flu in 11/16 resp cx, end 11/24; repeat resp cx/CXR sent 11/30 with Serratia and Steno, changed to Bactrim to complete 7d Dispo - ICU, back on vent - HTC as able, TOC working on [employer] placement and I discussed this with his daughter at the bedside.  Critical Care Total Time*: 34 Minutes  Violeta Gelinas, MD, MPH, FACS Trauma & General Surgery Use [email or web address] to contact on call provider  08/24/2021  *Care during the described time interval was provided by me. I have reviewed this patient's available data, including medical history, events of note, physical examination and test results as part of my evaluation.

## 2021-08-24 NOTE — Progress Notes (Signed)
Occupational Therapy Treatment Note  Pt back on vent today. Seen on 60% FiO2; Peep 5. Daughter present in room during session. Focus of session on strengthening and activity tolerance using chair position and theraband. Encouraged pt to complete exercise/activities throughout the day. Excellent participation with OT. At this time, pt more appropriate for OT at [facility]. Will continue to follow acutely.     08/24/21 1100  OT Visit Information  Last OT Received On 08/24/21  Assistance Needed +2  History of Present Illness 65 y/o male presented to ED on 07/27/21 after fall off 8 foot ladder. CT chest found small R apical pneumothorax, multiple displaced posterior R rib fxs, nondisplaced R iliac wing fx with widening of R SI joint, displaced fx both inferior pubic rami and L superior pubic ramus, and nondisplaced L sacral fx, SI screw 07/29/21. intubated 11/9, trach 11/22. Trach collar 11/30, back on vent on/off. PMH: COPD, EtOH abuse, HTN, GERD, hep C  Precautions  Precautions Fall  Precaution Comments foley, vent via trach, cortrak  Restrictions  RLE Weight Bearing NWB  LLE Weight Bearing WBAT  Pain Assessment  Pain Assessment Faces  Faces Pain Scale 4  Pain Location generalized  Pain Descriptors / Indicators Discomfort  Pain Intervention(s) Limited activity within patient's tolerance  Cognition  Arousal/Alertness Awake/alert  Behavior During Therapy Restless  Overall Cognitive Status Difficult to assess  Current Attention Level Sustained  General Comments following commands appropriately; trying to mouth/talk on vent  Upper Extremity Assessment  Upper Extremity Assessment Generalized weakness  Lower Extremity Assessment  Lower Extremity Assessment Defer to PT evaluation  ADL  Grooming Moderate assistance  General ADL Comments encouraged daughter to have her Dad wash his face adn particiapte in as many ADL tasks as possible  Bed Mobility  Overal bed mobility Needs Assistance   Bed Mobility Supine to Sit  Supine to sit Mod assist;HOB elevated  General bed mobility comments Mod assist to sit momentarily with trunk off of bed pulling with bil UE and supported trunk by PT.  Pt only able to sit momentarily before coughing fit, popped vent off trach and needed several deep suctions by RN during session due to copious amounts of secreations.  Transfers  General transfer comment not attempted this date  Balance  Overall balance assessment Needs assistance  Sitting-balance support Bilateral upper extremity supported  Sitting balance-Leahy Scale Poor  Sitting balance - Comments bil UE support and trunk supported by PT.  Exercises  Exercises General Upper Extremity;General Lower Extremity  General Exercises - Upper Extremity  Shoulder Flexion AAROM;Both;10 reps;Theraband  Elbow Flexion AAROM;Both;10 reps;Seated;Theraband  Shoulder ABduction AROM;Strengthening;Both;10 reps;Theraband  Elbow Extension Strengthening;AROM;Both;20 reps;Seated;Theraband  Theraband Level (Shoulder Flexion) Level 1 (Yellow)  Theraband Level (Shoulder Abduction) Level 1 (Yellow)  Theraband Level (Elbow Flexion) Level 1 (Yellow)  Other Exercises  Other Exercises Daughter educated on T-band exercises; will benefit from HEP  Other Exercises cross body reaching to pull on rials for bed mobility  OT - End of Session  Equipment Utilized During Treatment Other (comment) (vent)  Activity Tolerance Patient tolerated treatment well  Patient left in bed;with call bell/phone within reach;with family/visitor present;with bed alarm set;Other (comment) (chair position in bed)  Nurse Communication Mobility status  OT Assessment/Plan  OT Plan Discharge plan needs to be updated  OT Visit Diagnosis Muscle weakness (generalized) (M62.81);Other abnormalities of gait and mobility (R26.89);History of falling (Z91.81);Other symptoms and signs involving cognitive function;Dizziness and giddiness (R42);Pain  Pain -  Right/Left Right  Pain -  part of body Hip (back)  OT Frequency (ACUTE ONLY) Min 2X/week  Follow Up Recommendations OT at Long-term acute care hospital  Assistance recommended at discharge Frequent or constant Supervision/Assistance  OT Equipment Other (comment) (TBA)  AM-PAC OT "6 Clicks" Daily Activity Outcome Measure (Version 2)  Help from another person eating meals? 1  Help from another person taking care of personal grooming? 2  Help from another person toileting, which includes using toliet, bedpan, or urinal? 1  Help from another person bathing (including washing, rinsing, drying)? 2  Help from another person to put on and taking off regular upper body clothing? 2  Help from another person to put on and taking off regular lower body clothing? 1  6 Click Score 9  Progressive Mobility  What is the highest level of mobility based on the progressive mobility assessment? Level 2 (Chairfast) - Balance while sitting on edge of bed and cannot stand  Mobility Sit up in bed/chair position for meals  OT Goal Progression  Progress towards OT goals Progressing toward goals  Acute Rehab OT Goals  Patient Stated Goal to get better  OT Goal Formulation With patient/family  Time For Goal Achievement 08/31/21  Potential to Achieve Goals Good  ADL Goals  Pt Will Perform Grooming with set-up;sitting  Pt Will Perform Upper Body Bathing with min assist;sitting  Additional ADL Goal #1 pt will tolerate sitting with SBA ~10 minutes at EOB for increased indep with seated self care tasks  Additional ADL Goal #2 pt will complete bed mobility including both rolling and supine<>sitting with min A in prep for participation with self care tasks  OT Time Calculation  OT Start Time (ACUTE ONLY) 1104  OT Stop Time (ACUTE ONLY) 1131  OT Time Calculation (min) 27 min  OT General Charges  $OT Visit 1 Visit  OT Treatments  $Therapeutic Activity 8-22 mins  $Therapeutic Exercise 8-22 mins  Luisa Dago, OT/L    Acute OT Clinical Specialist Acute Rehabilitation Services Pager [phone] Office [phone]

## 2021-08-24 NOTE — Consult Note (Signed)
[facility] Face-to-Face Psychiatry Consult   Patient Identification: [name] MRN:  [id number] Principal Diagnosis: <principal problem not specified> Diagnosis:  Active Problems:   Fall   Anxiety disorder due to known physiological condition  Consulted by Reather Laurence MD Assessment  [name] is a 65 y.o. male admitted medically 07/27/2021  3:31 PM for fall from a ladder. Patient carries no previous  psychiatric diagnoses (per daughter hx EtOH use d/o anxiety)and has a past medical history of  COPD, HTN, GERD, and hx of Hep C. Psychiatry was consulted for concern from delirium and concern for increased anxiety when attempting to wean off of trach.     On initial examination, patient was too sedated to follow commands and could not be assessed with his current baseline being non verbal. Patient's presentation and hx from daughter endorsed that patient was delirious in the ICU. Initial medication changes included introduction of hydroxyzine, to which pt had a good response. He has been able to communicate with a Passy Muir Valve for several hours. He unfortunately developed a pneumonia shortly afterwards, which resulted in a transfer back to ICU, large increase in secretions, and ventilator dependence. He is currently doing better and again tolerating brief TCT. When on TCT, appears to be euthymic with intact sense of humor.  Discussed with daughter at bedside - significant improvement overall in cognition since klonopin was tapered.    Meds Continue hydroxyzine 25 mg QID scheduled plus QID PRN for anxiety and secretions Continue melatonin 3 mg QHS Continue librium 10 mg for 1 dose, then 5 mg TID for 2 days, then stop librium Decrease quetiapine to 100/200 mg  Have discontinued Klonopin   CONTINUE Gabapentin per primary  Haldol 5 q6 PRN    Labs/workup folate, B12 (still macrocytic despite adequate thiamine supplementation), replete if low   - not ordered will add on   Will  continue to follow  Total Time spent with patient: 20 minutes  Subjective:   [name] is a 65 y.o. male patient admitted with fall and subsequent trauma.  HPI:  Patient seen in ICU bed on TCT. He is alert and oriented (4/4 CAM-ICU, SAVEAHAART with no errors). Denied SI, HI, AH/VH. Discussed medications and changes above with pt and daughter at bedside. Had Passy-Muir put back in toward tail end of eval, but had some difficulty talking through it - overall looked better than initial eval but worse than early last week.     Past Medical History:  Past Medical History:  Diagnosis Date  . COPD (chronic obstructive pulmonary disease) ([place])   . GERD (gastroesophageal reflux disease)   . History of hepatitis C    treated several years ago through [place]  . Hypertension     Past Surgical History:  Procedure Laterality Date  . CATARACT EXTRACTION, BILATERAL    . EYE SURGERY Bilateral   . ORIF PELVIC FRACTURE WITH PERCUTANEOUS SCREWS Right 07/29/2021   Procedure: SI SCREW FIXATION OF RIGHT PELVIC RING;  Surgeon: Altamese Tishomingo, MD;  Location: [facility];  Service: Orthopedics;  Laterality: Right;  . ORTHOPEDIC SURGERY Bilateral    pt states fracture repairs to arms and legs.  Marland Kitchen REVERSE SHOULDER ARTHROPLASTY Left 09/23/2019   Procedure: REVERSE SHOULDER ARTHROPLASTY;  Surgeon: Tania Ade, MD;  Location: [facility];  Service: Orthopedics;  Laterality: Left;  . TRACHEOSTOMY TUBE PLACEMENT N/A 08/10/2021   Procedure: TRACHEOSTOMY;  Surgeon: Georganna Skeans, MD;  Location: [place] [facility];  Service: General;  Laterality:  N/A;   Family History:  Family History  Problem Relation Age of Onset  . Heart disease Mother   . Hypertension Father     Social History:  Social History   Substance and Sexual Activity  Alcohol Use Yes  . Alcohol/week: 42.0 standard drinks  . Types: 42 Cans of beer per week   Comment: daily - 6 drinks a day     Social History   Substance and Sexual Activity  Drug Use Yes   . Types: Marijuana   Comment: last marijuana 2-3 weeks ago    Social History   Socioeconomic History  . Marital status: Single    Spouse name: Not on file  . Number of children: 2  . Years of education: Not on file  . Highest education level: Not on file  Occupational History  . Occupation: Architect  Tobacco Use  . Smoking status: Former    Packs/day: 1.00    Years: 35.00    Pack years: 35.00    Types: Cigarettes    Start date: 53    Quit date: 10/01/2010    Years since quitting: 10.9  . Smokeless tobacco: Never  Vaping Use  . Vaping Use: Never used  Substance and Sexual Activity  . Alcohol use: Yes    Alcohol/week: 42.0 standard drinks    Types: 42 Cans of beer per week    Comment: daily - 6 drinks a day  . Drug use: Yes    Types: Marijuana    Comment: last marijuana 2-3 weeks ago  . Sexual activity: Not Currently  Other Topics Concern  . Not on file  Social History Narrative  . Not on file   Social Determinants of Health   Financial Resource Strain: Not on file  Food Insecurity: Not on file  Transportation Needs: Not on file  Physical Activity: Not on file  Stress: Not on file  Social Connections: Not on file   Additional Social History:    Allergies:  No Known Allergies  Labs:  Results for orders placed or performed during the hospital encounter of 07/27/21 (from the past 48 hour(s))  Glucose, capillary     Status: Abnormal   Collection Time: 08/22/21  7:44 PM  Result Value Ref Range   Glucose-Capillary 118 (H) 70 - 99 mg/dL    Comment: Glucose reference range applies only to samples taken after fasting for at least 8 hours.  Glucose, capillary     Status: Abnormal   Collection Time: 08/22/21 11:19 PM  Result Value Ref Range   Glucose-Capillary 140 (H) 70 - 99 mg/dL    Comment: Glucose reference range applies only to samples taken after fasting for at least 8 hours.  Glucose, capillary     Status: Abnormal   Collection Time: 08/23/21  3:25  AM  Result Value Ref Range   Glucose-Capillary 117 (H) 70 - 99 mg/dL    Comment: Glucose reference range applies only to samples taken after fasting for at least 8 hours.  Basic metabolic panel     Status: Abnormal   Collection Time: 08/23/21  3:56 AM  Result Value Ref Range   Sodium 136 135 - 145 mmol/L   Potassium 3.3 (L) 3.5 - 5.1 mmol/L    Comment: DELTA CHECK NOTED   Chloride 97 (L) 98 - 111 mmol/L   CO2 33 (H) 22 - 32 mmol/L   Glucose, Bld 123 (H) 70 - 99 mg/dL    Comment: Glucose reference range applies only to  samples taken after fasting for at least 8 hours.   BUN 20 8 - 23 mg/dL   Creatinine, Ser 0.52 (L) 0.61 - 1.24 mg/dL   Calcium 8.7 (L) 8.9 - 10.3 mg/dL   GFR, Estimated >60 >60 mL/min    Comment: (NOTE) Calculated using the CKD-EPI Creatinine Equation (2021)    Anion gap 6 5 - 15    Comment: Performed at [facility] [address] [place], [place] [address]  Glucose, capillary     Status: Abnormal   Collection Time: 08/23/21  8:08 AM  Result Value Ref Range   Glucose-Capillary 125 (H) 70 - 99 mg/dL    Comment: Glucose reference range applies only to samples taken after fasting for at least 8 hours.  Glucose, capillary     Status: Abnormal   Collection Time: 08/23/21 12:02 PM  Result Value Ref Range   Glucose-Capillary 125 (H) 70 - 99 mg/dL    Comment: Glucose reference range applies only to samples taken after fasting for at least 8 hours.  Glucose, capillary     Status: Abnormal   Collection Time: 08/23/21  3:56 PM  Result Value Ref Range   Glucose-Capillary 106 (H) 70 - 99 mg/dL    Comment: Glucose reference range applies only to samples taken after fasting for at least 8 hours.  Glucose, capillary     Status: Abnormal   Collection Time: 08/23/21  7:30 PM  Result Value Ref Range   Glucose-Capillary 135 (H) 70 - 99 mg/dL    Comment: Glucose reference range applies only to samples taken after fasting for at least 8 hours.  Glucose, capillary      Status: Abnormal   Collection Time: 08/23/21 11:31 PM  Result Value Ref Range   Glucose-Capillary 141 (H) 70 - 99 mg/dL    Comment: Glucose reference range applies only to samples taken after fasting for at least 8 hours.  Glucose, capillary     Status: Abnormal   Collection Time: 08/24/21  3:29 AM  Result Value Ref Range   Glucose-Capillary 129 (H) 70 - 99 mg/dL    Comment: Glucose reference range applies only to samples taken after fasting for at least 8 hours.  Basic metabolic panel     Status: Abnormal   Collection Time: 08/24/21  3:38 AM  Result Value Ref Range   Sodium 137 135 - 145 mmol/L   Potassium 3.6 3.5 - 5.1 mmol/L   Chloride 99 98 - 111 mmol/L   CO2 29 22 - 32 mmol/L   Glucose, Bld 122 (H) 70 - 99 mg/dL    Comment: Glucose reference range applies only to samples taken after fasting for at least 8 hours.   BUN 16 8 - 23 mg/dL   Creatinine, Ser 0.68 0.61 - 1.24 mg/dL   Calcium 8.8 (L) 8.9 - 10.3 mg/dL   GFR, Estimated >60 >60 mL/min    Comment: (NOTE) Calculated using the CKD-EPI Creatinine Equation (2021)    Anion gap 9 5 - 15    Comment: Performed at [facility] [address], [place] [address]  CBC     Status: Abnormal   Collection Time: 08/24/21  3:38 AM  Result Value Ref Range   WBC 4.9 4.0 - 10.5 K/uL   RBC 2.68 (L) 4.22 - 5.81 MIL/uL   Hemoglobin 8.6 (L) 13.0 - 17.0 g/dL   HCT 27.7 (L) 39.0 - 52.0 %   MCV 103.4 (H) 80.0 - 100.0 fL  MCH 32.1 26.0 - 34.0 pg   MCHC 31.0 30.0 - 36.0 g/dL   RDW 13.8 11.5 - 15.5 %   Platelets 227 150 - 400 K/uL   nRBC 0.0 0.0 - 0.2 %    Comment: Performed at [facility], [place] [address], [place], [place] [address]  Glucose, capillary     Status: Abnormal   Collection Time: 08/24/21  8:09 AM  Result Value Ref Range   Glucose-Capillary 125 (H) 70 - 99 mg/dL    Comment: Glucose reference range applies only to samples taken after fasting for at least 8 hours.  Glucose, capillary     Status: Abnormal    Collection Time: 08/24/21 11:16 AM  Result Value Ref Range   Glucose-Capillary 123 (H) 70 - 99 mg/dL    Comment: Glucose reference range applies only to samples taken after fasting for at least 8 hours.  Glucose, capillary     Status: Abnormal   Collection Time: 08/24/21  3:16 PM  Result Value Ref Range   Glucose-Capillary 131 (H) 70 - 99 mg/dL    Comment: Glucose reference range applies only to samples taken after fasting for at least 8 hours.    Current Facility-Administered Medications  Medication Dose Route Frequency Provider Last Rate Last Admin  . 0.9 %  sodium chloride infusion  250 mL Intravenous Continuous Norm Parcel, PA-C 20 mL/hr at 08/11/21 0535 250 mL at 08/11/21 0535  . acetaminophen (TYLENOL) tablet 1,000 mg  1,000 mg Per Tube Q6H Barkley Boards R, PA-C   1,000 mg at 08/24/21 1225  . aspirin chewable tablet 81 mg  81 mg Per Tube Daily Georganna Skeans, MD   81 mg at 08/24/21 6503  . bethanechol (URECHOLINE) tablet 25 mg  25 mg Per Tube TID Norm Parcel, PA-C   25 mg at 08/24/21 1527  . chlordiazePOXIDE (LIBRIUM) capsule 10 mg  10 mg Per Tube TID Ginna Schuur, Excell Seltzer ON 08/25/2021] chlordiazePOXIDE (LIBRIUM) capsule 5 mg  5 mg Oral BID Linh Hedberg A      . chlorhexidine gluconate (MEDLINE KIT) (PERIDEX) 0.12 % solution 15 mL  15 mL Mouth Rinse BID Norm Parcel, PA-C   15 mL at 08/24/21 0919  . Chlorhexidine Gluconate Cloth 2 % PADS 6 each  6 each Topical Daily Norm Parcel, PA-C   6 each at 08/21/21 2218  . cholecalciferol (VITAMIN D3) tablet 2,000 Units  2,000 Units Per Tube Daily Norm Parcel, PA-C   2,000 Units at 08/24/21 5465  . enoxaparin (LOVENOX) injection 40 mg  40 mg Subcutaneous Q12H Norm Parcel, PA-C   40 mg at 08/24/21 6812  . feeding supplement (JEVITY 1.5 CAL/FIBER) liquid 1,000 mL  1,000 mL Per Tube Continuous Norm Parcel, PA-C 65 mL/hr at 08/24/21 1012 1,000 mL at 08/24/21 1012  . feeding supplement  (PROSource TF) liquid 90 mL  90 mL Per Tube BID Norm Parcel, PA-C   90 mL at 08/24/21 7517  . folic acid (FOLVITE) tablet 1 mg  1 mg Per Tube Daily Norm Parcel, PA-C   1 mg at 08/24/21 0017  . free water 250 mL  250 mL Per Tube Q6H Barkley Boards R, PA-C   250 mL at 08/24/21 1234  . gabapentin (NEURONTIN) 250 MG/5ML solution 300 mg  300 mg Per Tube Q8H Kinsinger, Arta Bruce, MD   300 mg at 08/24/21 1309  . glycopyrrolate (ROBINUL) tablet  2 mg  2 mg Per Tube TID Dwan Bolt, MD   2 mg at 08/24/21 1527  . guaiFENesin (ROBITUSSIN) 100 MG/5ML liquid 10 mL  10 mL Per Tube Q4H Barkley Boards R, PA-C   10 mL at 08/24/21 1527  . haloperidol (HALDOL) tablet 5 mg  5 mg Oral Q6H PRN [name], MD   5 mg at 08/16/21 2992   Or  . haloperidol lactate (HALDOL) injection 5 mg  5 mg Intramuscular Q6H PRN [name], MD       Or  . haloperidol lactate (HALDOL) injection 5 mg  5 mg Intravenous Q6H PRN [name], MD   5 mg at 08/19/21 1719  . hydrocortisone cream 1 %   Topical TID Georganna Skeans, MD   Given at 08/24/21 1528  . HYDROmorphone (DILAUDID) injection 1-2 mg  1-2 mg Intravenous Q1H PRN Norm Parcel, PA-C   2 mg at 08/22/21 0053  . hydrOXYzine (ATARAX) tablet 25 mg  25 mg Per Tube QID PRN Brayah Urquilla A   25 mg at 08/24/21 1435  . hydrOXYzine (ATARAX) tablet 25 mg  25 mg Per Tube QID [name], MD   25 mg at 08/24/21 1309  . ibuprofen (ADVIL) tablet 600 mg  600 mg Per Tube Q6H PRN Kinsinger, Arta Bruce, MD   600 mg at 08/18/21 1006  . insulin aspart (novoLOG) injection 0-15 Units  0-15 Units Subcutaneous Q4H Norm Parcel, [place]   2 Units at 08/24/21 1528  . insulin glargine-yfgn (SEMGLEE) injection 5 Units  5 Units Subcutaneous Daily Norm Parcel, [place]   5 Units at 08/24/21 4268  . ipratropium-albuterol (DUONEB) 0.5-2.5 (3) MG/3ML nebulizer solution 3 mL  3 mL Nebulization Q6H PRN Barkley Boards R, PA-C   3 mL at 08/02/21 0319  . lidocaine  (LIDODERM) 5 % 1 patch  1 patch Transdermal Q24H Norm Parcel, PA-C   1 patch at 08/21/21 3419  . lipase/protease/amylase) (VIOKACE) tablets 20,880 Units  20,880 Units Per Tube Once Lovick, Montel Culver, MD       And  . sodium bicarbonate tablet 650 mg  650 mg Per Tube Once [name], MD      . MEDLINE mouth rinse  15 mL Mouth Rinse 10 times per day Norm Parcel, PA-C   15 mL at 08/24/21 1528  . melatonin tablet 3 mg  3 mg Per Tube q1800 Keo Schirmer A   3 mg at 08/23/21 1810  . methocarbamol (ROBAXIN) tablet 1,000 mg  1,000 mg Per Tube Q8H Barkley Boards R, PA-C   1,000 mg at 08/24/21 1309  . multivitamin with minerals tablet 1 tablet  1 tablet Per Tube Daily Norm Parcel, PA-C   1 tablet at 08/24/21 6222  . ondansetron (ZOFRAN-ODT) disintegrating tablet 4 mg  4 mg Oral Q6H PRN Norm Parcel, PA-C       Or  . ondansetron [facility]) injection 4 mg  4 mg Intravenous Q6H PRN Norm Parcel, PA-C   4 mg at 07/27/21 2048  . oxyCODONE (ROXICODONE) 5 MG/5ML solution 5-10 mg  5-10 mg Per Tube Q3H PRN Norm Parcel, PA-C   5 mg at 08/24/21 9798  . pantoprazole sodium (PROTONIX) 40 mg/20 mL oral suspension 40 mg  40 mg Per Tube Daily Norm Parcel, PA-C   40 mg at 08/24/21 0920  . polyethylene glycol (MIRALAX / GLYCOLAX) packet 17 g  17 g Per Tube  Daily PRN Stechschulte, Nickola Major, MD   17 g at 08/18/21 0940  . pravastatin (PRAVACHOL) tablet 40 mg  40 mg Per Tube Daily Stechschulte, Nickola Major, MD   40 mg at 08/24/21 0917  . QUEtiapine (SEROQUEL) tablet 100 mg  100 mg Per Tube BID Anmol Paschen A      . QUEtiapine (SEROQUEL) tablet 100 mg  100 mg Oral QHS Berlinda Farve A      . silodosin (RAPAFLO) capsule 8 mg  8 mg Per Tube Q breakfast Norm Parcel, PA-C   8 mg at 08/24/21 0916  . sulfamethoxazole-trimethoprim (BACTRIM) 319.04 mg in dextrose 5 % 500 mL IVPB  10 mg/kg/day (Adjusted) Intravenous Q8H Georganna Skeans, MD 346.7 mL/hr at 08/24/21 1400 Infusion Verify at  08/24/21 1400  . thiamine tablet 100 mg  100 mg Per Tube Daily Norm Parcel, PA-C   100 mg at 08/24/21 2703   Or  . thiamine (B-1) injection 100 mg  100 mg Intravenous Daily Norm Parcel, PA-C   100 mg at 08/10/21 5009    Psychiatric Specialty Exam:  Presentation  General Appearance: Appropriate for Environment  Eye Contact:Good  Speech:-- (unable to speak due to trach)  Speech Volume:-- (unable to speak due to trach)  Handedness:No data recorded  Mood and Affect  Mood:Euthymic  Affect:Appropriate   Thought Process  Thought Processes:Coherent  Descriptions of Associations:-- (unable to speak due to trach)  Orientation:Full (Time, Place and Person)  Thought Content:Logical  History of Schizophrenia/Schizoaffective disorder:No data recorded Duration of Psychotic Symptoms:No data recorded Hallucinations:No data recorded  Ideas of Reference:-- (unable to assess as patient cannot speak)  Suicidal Thoughts:No data recorded  Homicidal Thoughts:No data recorded   Sensorium  Memory:-- (unable to assess as patient cannot speak)  Judgment:-- (unable to assess as patient cannot speak)  Insight:-- (unable to assess as patient cannot speak)   Executive Functions  Concentration:Good  Attention Span:Good  Recall:-- (unable to assess as patient cannot speak) Fund of Knowledge:-- (unable to assess as patient cannot speak)  Language:-- (unable to assess as patient cannot speak)   Psychomotor Activity  Psychomotor Activity:No data recorded   Assets  Assets:Resilience; Social Support   Sleep  Sleep:No data recorded   Physical Exam: Physical Exam HENT:     Head: Normocephalic and atraumatic.  Neurological:     Mental Status: He is alert.     Comments: Oriented to person, place, situation says the year is 70 then corrects himself to 2002.    ROS Blood pressure (!) 135/95, pulse (!) 103, temperature 98.1 F (36.7 C), temperature source Axillary,  resp. rate (!) 24, height '6\' 2"'  (1.88 m), weight 110 kg, SpO2 94 %. Body mass index is 31.14 kg/m.  PGY-2  Joycelyn Schmid A Maryana Pittmon 08/24/2021 3:35 PM

## 2021-08-24 NOTE — Progress Notes (Signed)
Patient's oxygen saturation dropped to low 80s while on trach collar.  Patient heart rate elevated, tachypnea, and also appears to be labor.  RN put patient back on ventilator. RT notified and settings verbally verified.  Patient oxygen saturation back above 90% and appears to be more comfortable.  RN to continue to monitor.

## 2021-08-24 NOTE — TOC Progression Note (Signed)
Transition of Care [facility]) - Progression Note    Patient Details  Name: FARREN NELLES MRN: [id number] Date of Birth: Aug 24, 1956  [facility]) CM/SW Contact  Ella Bodo, RN Phone Number: 08/24/2021, 12:00  Clinical Narrative:    Daughter has received denial letter from [employer] agency.  She has brought in copy of patient's medical insurance through his work.  Met with daughter at bedside; she is agreeable with proceeding with transition to [facility] hospital for continued weaning.  Notified Arley Phenix in admissions at [facility];  she states she will be able to begin insurance authorization once admitting has updated in the system.  Will follow progress.   Expected Discharge Plan: Long Term Acute Care (LTAC) Barriers to Discharge: Continued Medical Work up  Expected Discharge Plan and Services Expected Discharge Plan: Long Term Acute Care (LTAC)   Discharge Planning Services: CM Consult Post Acute Care Choice: Long Term Acute Care (LTAC) Living arrangements for the past 2 months: Single Family Home                                       Social Determinants of Health (SDOH) Interventions    Readmission Risk Interventions No flowsheet data found.  Reinaldo Raddle, RN, BSN  Trauma/Neuro ICU Case Manager [phone]

## 2021-08-25 LAB — BASIC METABOLIC PANEL
Anion gap: 7 (ref 5–15)
BUN: 20 mg/dL (ref 8–23)
CO2: 29 mmol/L (ref 22–32)
Calcium: 8.6 mg/dL — ABNORMAL LOW (ref 8.9–10.3)
Chloride: 101 mmol/L (ref 98–111)
Creatinine, Ser: 0.73 mg/dL (ref 0.61–1.24)
GFR, Estimated: >60 mL/min (ref >60)
Glucose, Bld: 125 mg/dL — ABNORMAL HIGH (ref 70–99)
Potassium: 4 mmol/L (ref 3.5–5.1)
Sodium: 137 mmol/L (ref 135–145)

## 2021-08-25 LAB — VITAMIN B12: Vitamin B-12: 610 pg/mL (ref 180–914)

## 2021-08-25 LAB — GLUCOSE, CAPILLARY
Glucose-Capillary: 113 mg/dL — ABNORMAL HIGH (ref 70–99)
Glucose-Capillary: 112 mg/dL — ABNORMAL HIGH (ref 70–99)
Glucose-Capillary: 106 mg/dL — ABNORMAL HIGH (ref 70–99)
Glucose-Capillary: 132 mg/dL — ABNORMAL HIGH (ref 70–99)
Glucose-Capillary: 122 mg/dL — ABNORMAL HIGH (ref 70–99)
Glucose-Capillary: 112 mg/dL — ABNORMAL HIGH (ref 70–99)

## 2021-08-25 LAB — FOLATE: Folate: 21.3 ng/mL (ref >5.9)

## 2021-08-25 MED ORDER — CHLORDIAZEPOXIDE HCL 5 MG PO CAPS
5.0000 mg | ORAL_CAPSULE | Freq: Two times a day (BID) | ORAL | Status: AC
  Administered 2021-08-25 – 2021-08-26 (×4): 5 mg
  Filled 2021-08-25 (×4): qty 1

## 2021-08-25 NOTE — Progress Notes (Signed)
Trauma/Critical Care Follow Up Note  Subjective:    Overnight Issues:   Objective:  Vital signs for last 24 hours: Temp:  [98.1 F (36.7 C)-100 F (37.8 C)] 98.5 F (36.9 C) (12/07 0800) Pulse Rate:  [90-112] 109 (12/07 1000) Resp:  [12-38] 31 (12/07 1000) BP: (101-154)/(60-95) 154/85 (12/07 1000) SpO2:  [89 %-100 %] 98 % (12/07 1000) FiO2 (%):  [40 %-60 %] 60 % (12/07 0750) Weight:  [110.9 kg] 110.9 kg (12/07 0500)  Hemodynamic parameters for last 24 hours:    Intake/Output from previous day: 12/06 0701 - 12/07 0700 In: 3407.8 [NG/GT:1795; IV Piggyback:1612.8] Out: 3550 [Urine:3550]  Intake/Output this shift: Total I/O In: 260 [NG/GT:260] Out: 425 [Urine:425]  Vent settings for last 24 hours: Vent Mode: PRVC FiO2 (%):  [40 %-60 %] 60 % Set Rate:  [16 bmp] 16 bmp Vt Set:  [650 mL] 650 mL PEEP:  [5 cmH20] 5 cmH20 Plateau Pressure:  [16 cmH20-18 cmH20] 16 cmH20  Physical Exam:  Gen: comfortable, no distress Neuro: non-focal exam HEENT: PERRL Neck: supple CV: RRR Pulm: unlabored breathing Abd: soft, NT GU: clear yellow urine Extr: wwp, no edema   Results for orders placed or performed during the hospital encounter of 07/27/21 (from the past 24 hour(s))  Glucose, capillary     Status: Abnormal   Collection Time: 08/24/21  3:16 PM  Result Value Ref Range   Glucose-Capillary 131 (H) 70 - 99 mg/dL  Glucose, capillary     Status: Abnormal   Collection Time: 08/24/21  7:33 PM  Result Value Ref Range   Glucose-Capillary 135 (H) 70 - 99 mg/dL  Glucose, capillary     Status: Abnormal   Collection Time: 08/24/21 11:31 PM  Result Value Ref Range   Glucose-Capillary 159 (H) 70 - 99 mg/dL  Glucose, capillary     Status: Abnormal   Collection Time: 08/25/21  3:15 AM  Result Value Ref Range   Glucose-Capillary 113 (H) 70 - 99 mg/dL  Basic metabolic panel     Status: Abnormal   Collection Time: 08/25/21  3:57 AM  Result Value Ref Range   Sodium 137 135 - 145  mmol/L   Potassium 4.0 3.5 - 5.1 mmol/L   Chloride 101 98 - 111 mmol/L   CO2 29 22 - 32 mmol/L   Glucose, Bld 125 (H) 70 - 99 mg/dL   BUN 20 8 - 23 mg/dL   Creatinine, Ser 2.35 0.61 - 1.24 mg/dL   Calcium 8.6 (L) 8.9 - 10.3 mg/dL   GFR, Estimated >36 >14 mL/min   Anion gap 7 5 - 15  Folate, serum, performed at [facility] lab     Status: None   Collection Time: 08/25/21  3:57 AM  Result Value Ref Range   Folate 21.3 >5.9 ng/mL  Vitamin B12     Status: None   Collection Time: 08/25/21  3:57 AM  Result Value Ref Range   Vitamin B-12 610 180 - 914 pg/mL  Glucose, capillary     Status: Abnormal   Collection Time: 08/25/21  7:56 AM  Result Value Ref Range   Glucose-Capillary 112 (H) 70 - 99 mg/dL    Assessment & Plan: The plan of care was discussed with the bedside nurse for the day, who is in agreement with this plan and no additional concerns were raised.   Present on Admission:  Fall    LOS: 29 days   Additional comments:I reviewed the patient's new clinical lab test results.  and I reviewed the patients new imaging test results.    Fall off ladder   Acute hypoxic ventilator dependent respiratory failure - intubated 11/9, self-extubated and promptly re-intubated 11/12 AM. Extubated and reintubated 11/21. Trach 11/22, now back on vent, continue robinul and guaifenesin. HTC as able (on it now)- in line PMV Agitation/delirium - improved, appreciate Psychiatry assist and I D/W them at the bedside today. R PTX - resolved R rib fractures - aggressive multimodal pain control, IS, pulm toilet Pelvic fx (L sacrum, R iliac, b/l inf and L sup pubic rami) with pelvic hematoma - ortho c/s, Dr. 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 COPD - prn duonebs, guaifenesin, trach 11/22, see above Hyperglycemia - SSI, glargine 5u, glc well-controlled Hypokalemia - monitor and replace if needed Anemia - stable, monitor HTN - home lisinopril held for  low BP Hx of Hep C FEN: NPO, replace cortrak, +BM DVT - SCDs, LMWH Foley - replaced for retention 11/17, 11/22, 11/29 ID - CTX x7d for H flu in 11/16 resp cx, end 11/24; repeat resp cx/CXR sent 11/30 with Serratia and Steno, changed to Bactrim to complete 7d Dispo - ICU, HTC as able, TOC working on [employer] placement and I discussed this with his daughter at the bedside.  Critical Care Total Time: 35 minutes  Diamantina Monks, MD Trauma & General Surgery Please use [email or web address] to contact on call provider  08/25/2021  *Care during the described time interval was provided by me. I have reviewed this patient's available data, including medical history, events of note, physical examination and test results as part of my evaluation.

## 2021-08-25 NOTE — Progress Notes (Signed)
Patient had increased work of breathing and agitation. RT connected patient back to the ventilator at this time.

## 2021-08-25 NOTE — Progress Notes (Signed)
SLP Cancellation Note  Patient Details Name: [name] MRN: [id number] DOB: 04/27/1956   Cancelled treatment:     On vent per review of chart - SLP to follow for readiness for swallowing in addition to speech/cognition.        [name] 08/25/2021, 4:07 PM

## 2021-08-25 NOTE — TOC Progression Note (Addendum)
Transition of Care [facility]) - Progression Note    Patient Details  Name: [name] MRN: [id number] Date of Birth: 1956/04/14  Transition of Care [facility] CM/SW Contact  Astrid Drafts Berna Spare, RN Phone Number: 08/25/2021, 4:39 PM  Clinical Narrative:    Ezequiel Essex has authorized patient's hospital stay through December 12, per UR department.  [facility] to [employer] authorization for admission to [facility].  Will provide updates as available.    Expected Discharge Plan: Long Term Acute Care (LTAC) Barriers to Discharge: Continued Medical Work up  Expected Discharge Plan and Services Expected Discharge Plan: Long Term Acute Care (LTAC)   Discharge Planning Services: CM Consult Post Acute Care Choice: Long Term Acute Care (LTAC) Living arrangements for the past 2 months: Single Family Home                                       Social Determinants of Health (SDOH) Interventions    Readmission Risk Interventions No flowsheet data found.  [name], RN, BSN  Trauma/Neuro ICU Case Manager [phone]

## 2021-08-26 LAB — CBC
HCT: 31.5 % — ABNORMAL LOW (ref 39.0–52.0)
Hemoglobin: 9.8 g/dL — ABNORMAL LOW (ref 13.0–17.0)
MCH: 32.3 pg (ref 26.0–34.0)
MCHC: 31.1 g/dL (ref 30.0–36.0)
MCV: 104 fL — ABNORMAL HIGH (ref 80.0–100.0)
Platelets: 234 10*3/uL (ref 150–400)
RBC: 3.03 MIL/uL — ABNORMAL LOW (ref 4.22–5.81)
RDW: 13.7 % (ref 11.5–15.5)
WBC: 6.3 10*3/uL (ref 4.0–10.5)
nRBC: 0 % (ref 0.0–0.2)

## 2021-08-26 LAB — URINALYSIS, ROUTINE W REFLEX MICROSCOPIC

## 2021-08-26 LAB — GLUCOSE, CAPILLARY
Glucose-Capillary: 112 mg/dL — ABNORMAL HIGH (ref 70–99)
Glucose-Capillary: 119 mg/dL — ABNORMAL HIGH (ref 70–99)
Glucose-Capillary: 135 mg/dL — ABNORMAL HIGH (ref 70–99)
Glucose-Capillary: 97 mg/dL (ref 70–99)
Glucose-Capillary: 109 mg/dL — ABNORMAL HIGH (ref 70–99)
Glucose-Capillary: 114 mg/dL — ABNORMAL HIGH (ref 70–99)

## 2021-08-26 LAB — URINALYSIS, MICROSCOPIC (REFLEX): RBC / HPF: >50 RBC/hpf (ref 0–5)

## 2021-08-26 NOTE — Progress Notes (Signed)
Occupational Therapy Treatment Note  Pt seen as cotreat with PT to progress mobility. Nsg held vent tubing during session. Pt mouthing "I'm scared". Pt comforted and continued to demonstrate excellent participation, standing x 3 EOB with VSS. Completed seated level ADL.Increased WOB noted, however pt tolerated well. Pt really wanting to talk with therapists. Will try to coordinate a session with ST/RT in order to use in-line PMSV during session. Nsg updated family regarding ability to participate with rehab today.  Encourage chair position at bed level at least TID, encourage use of theraband and vblinds open/lights on during day time hours.    08/26/21 1545  OT Visit Information  Last OT Received On 08/26/21  Assistance Needed +2  PT/OT/SLP Co-Evaluation/Treatment Yes  Reason for Co-Treatment Complexity of the patient's impairments (multi-system involvement);Necessary to address cognition/behavior during functional activity;For patient/therapist safety;To address functional/ADL transfers  OT goals addressed during session ADL's and self-care  History of Present Illness 65 y/o male presented to ED on 07/27/21 after fall off 8 foot ladder. CT chest found small R apical pneumothorax, multiple displaced posterior R rib fxs, nondisplaced R iliac wing fx with widening of R SI joint, displaced fx both inferior pubic rami and L superior pubic ramus, and nondisplaced L sacral fx, SI screw 07/29/21. intubated 11/9, trach 11/22. Trach collar 11/30, back on vent on/off. PMH: COPD, EtOH abuse, HTN, GERD, hep C  Precautions  Precautions Fall  Precaution Comments foley, vent via trach, cortrak, anxious  Restrictions  RLE Weight Bearing NWB  LLE Weight Bearing WBAT  Other Position/Activity Restrictions dtr reports visual deficits at baseline, uses magnifying glass for near/upclose  Pain Assessment  Pain Assessment Faces  Faces Pain Scale 4  Pain Location back  Pain Intervention(s) Limited activity within  patient's tolerance  Cognition  Arousal/Alertness Awake/alert  Behavior During Therapy Restless;Impulsive (mildly inpulsive at EOB)  Overall Cognitive Status Impaired/Different from baseline  Area of Impairment Attention;Safety/judgement;Awareness;Problem solving  Current Attention Level Sustained  Memory Decreased recall of precautions  Safety/Judgement Decreased awareness of safety;Decreased awareness of deficits  Awareness Emergent  Problem Solving Difficulty sequencing;Requires verbal cues;Requires tactile cues  General Comments Pt pointed at his right leg indicating it was injured at some point, needed reinforcement of NWB R leg in standing and assist to maintain, def sustained and attempts at selective attention as he had multiple providers helping during session.  Difficult to assess due to Tracheostomy (pt mouthing words and at times pushing air around trach to talk)  Upper Extremity Assessment  Upper Extremity Assessment Generalized weakness  Lower Extremity Assessment  Lower Extremity Assessment Defer to PT evaluation  Vision- Assessment  Additional Comments vision deficits at baseline  ADL  Overall ADL's  Needs assistance/impaired  Grooming Minimal assistance  Grooming Details (indicate cue type and reason) washing his face; helping to suction; reaching as if to comb his hair  Bed Mobility  Overal bed mobility Needs Assistance  Bed Mobility Rolling;Supine to Sit;Sit to Sidelying  Rolling +2 for physical assistance;Mod assist  Sidelying to sit +2 for physical assistance;Mod assist;HOB elevated  Sit to sidelying +2 for physical assistance;Mod assist  General bed mobility comments Two person light mod assist for rolling bil and supine to sit from elevated HOB, heavy mod assist to return from sitting to sidlying to support trunk and progression of bil LEs up onto bed.  Transfers  Overall transfer level Needs assistance  Equipment used 2 person hand held assist;Rolling walker  (2 wheels) ("3 musketeers")  Transfers Sit to/from Stand  Sit to Stand +2 physical assistance;Mod assist;Max assist  General transfer comment Two person heavy mod to light max assist (improved with multiple attempts) initially with RW, but ultimately best with arms over both therapists' shoulders with left knee blocked and right foot on therapitst's foot to attempt to monitor and maintain NWB (difficult).  Balance  Overall balance assessment Needs assistance  Sitting-balance support Feet supported;Bilateral upper extremity supported  Sitting balance-Leahy Scale Fair  Sitting balance - Comments reliant on bil UE support or light support from therapist at trunk in sitting EOB.  Standing balance support Bilateral upper extremity supported  Standing balance-Leahy Scale Poor  Standing balance comment Two person mod assist to stand from elevated bed.  General Comments  General comments (skin integrity, edema, etc.) Pt in PRVC FiO2 40% PEEP 5, some coughing and pressure alarms, RR mostly in the low 30s and HR 110s  OT - End of Session  Equipment Utilized During Treatment Other (comment) (vent)  Activity Tolerance Patient tolerated treatment well  Patient left in bed;with call bell/phone within reach;with family/visitor present;with bed alarm set;Other (comment)  Nurse Communication Mobility status  OT Assessment/Plan  OT Plan Discharge plan remains appropriate  OT Visit Diagnosis Muscle weakness (generalized) (M62.81);Other abnormalities of gait and mobility (R26.89);History of falling (Z91.81);Other symptoms and signs involving cognitive function;Dizziness and giddiness (R42);Pain  Pain - part of body  (back)  OT Frequency (ACUTE ONLY) Min 2X/week  Follow Up Recommendations OT at Long-term acute care hospital  Assistance recommended at discharge Frequent or constant Supervision/Assistance  OT Equipment BSC/3in1;Tub/shower bench;Wheelchair (measurements OT);Wheelchair cushion (measurements  OT);Hospital bed  AM-PAC OT "6 Clicks" Daily Activity Outcome Measure (Version 2)  Help from another person eating meals? 1  Help from another person taking care of personal grooming? 3  Help from another person toileting, which includes using toliet, bedpan, or urinal? 1  Help from another person bathing (including washing, rinsing, drying)? 2  Help from another person to put on and taking off regular upper body clothing? 2  Help from another person to put on and taking off regular lower body clothing? 1  6 Click Score 10  Progressive Mobility  What is the highest level of mobility based on the progressive mobility assessment? Level 1 (Bedfast) - Unable to balance while sitting on edge of bed  Mobility Sit up in bed/chair position for meals  OT Goal Progression  Progress towards OT goals Progressing toward goals  Acute Rehab OT Goals  Patient Stated Goal to be able to talk  OT Goal Formulation With patient/family  Time For Goal Achievement 08/31/21  Potential to Achieve Goals Good  ADL Goals  Pt Will Perform Grooming with set-up;sitting  Pt Will Perform Upper Body Bathing with min assist;sitting  Additional ADL Goal #1 pt will tolerate sitting with SBA ~10 minutes at EOB for increased indep with seated self care tasks  Additional ADL Goal #2 pt will complete bed mobility including both rolling and supine<>sitting with min A in prep for participation with self care tasks  Pt/caregiver will Perform Home Exercise Program With written HEP provided;With Supervision;With theraband;Increased strength;Both right and left upper extremity  OT Time Calculation  OT Start Time (ACUTE ONLY) 1429  OT Stop Time (ACUTE ONLY) 1510  OT Time Calculation (min) 41 min  OT General Charges  $OT Visit 1 Visit  OT Treatments  $Self Care/Home Management  8-22 mins  $Neuromuscular Re-education 8-22 mins  Luisa Dago, OT/L   Acute OT Clinical Specialist Acute Rehabilitation Services  Pager  [phone] Office [phone]

## 2021-08-26 NOTE — Consult Note (Signed)
Brief Psychiatry Consult Note  The patient was last seen by the psychiatry service on 12/6 at which time we decreased AM quetiapine (see note for full assessment). Interim documentation by primary team and nursing staff has been reviewed.  Saw patient briefly today however could not talk to pt (too many secretions to tolerate passy-muir valve). Per nursing staff, he is doing well, less agitated during daytime, and participating in care.  Did well on SAVEAHAART questions - cognition dramatically improved from first consult. Will try to visit while he has PSMV tomorrow.   - no med changes  Claris Che A Dshaun Reppucci

## 2021-08-26 NOTE — Progress Notes (Signed)
Physical Therapy Treatment Patient Details Name: [name] MRN: [id number] DOB: [birth year] Today's Date: 08/26/2021   History of Present Illness 65 y/o male presented to ED on 07/27/21 after fall off 8 foot ladder. CT chest found small R apical pneumothorax, multiple displaced posterior R rib fxs, nondisplaced R iliac wing fx with widening of R SI joint, displaced fx both inferior pubic rami and L superior pubic ramus, and nondisplaced L sacral fx, SI screw 07/29/21. intubated 11/9, trach 11/22. Trach collar 11/30, back on vent on/off. PMH: COPD, EtOH abuse, HTN, GERD, hep C    PT Comments    Co-session with OT requiring two sets of skilled hands due to complexity of pt's medical state (trached, vented) and the ability to have him mobilize more, safely with the two of [place].  He reports being scared and really wanting to talk.  He works very hard and looks better than the last session that I worked with him earlier in the week.  PT will continue to follow acutely for safe mobility progression.   Recommendations for follow up therapy are one component of a multi-disciplinary discharge planning process, led by the attending physician.  Recommendations may be updated based on patient status, additional functional criteria and insurance authorization.  Follow Up Recommendations  PT at Long-term acute care hospital     Assistance Recommended at Discharge Frequent or constant Supervision/Assistance  Equipment Recommendations  BSC/3in1;Wheelchair (measurements PT);Wheelchair cushion (measurements PT);Hospital bed;Rolling walker (2 wheels)    Recommendations for Other Services       Precautions / Restrictions Precautions Precautions: Fall Precaution Comments: foley, vent via trach, cortrak, anxious Restrictions RLE Weight Bearing: Non weight bearing LLE Weight Bearing: Weight bearing as tolerated Other Position/Activity Restrictions: dtr reports visual deficits at baseline, uses magnifying  glass for near/upclose     Mobility  Bed Mobility Overal bed mobility: Needs Assistance Bed Mobility: Rolling;Supine to Sit;Sit to Sidelying Rolling: +2 for physical assistance;Mod assist Sidelying to sit: +2 for physical assistance;Mod assist;HOB elevated     Sit to sidelying: +2 for physical assistance;Mod assist General bed mobility comments: Two person light mod assist for rolling bil and supine to sit from elevated HOB, heavy mod assist to return from sitting to sidlying to support trunk and progression of bil LEs up onto bed.    Transfers Overall transfer level: Needs assistance Equipment used: 2 person hand held assist;Rolling walker (2 wheels) ("3 musketeers") Transfers: Sit to/from Stand Sit to Stand: +2 physical assistance;Mod assist;Max assist           General transfer comment: Two person heavy mod to light max assist (improved with multiple attempts) initially with RW, but ultimately best with arms over both therapists' shoulders with left knee blocked and right foot on therapitst's foot to attempt to monitor and maintain NWB (difficult).    Ambulation/Gait                   Stairs             Wheelchair Mobility    Modified Rankin (Stroke Patients Only)       Balance Overall balance assessment: Needs assistance Sitting-balance support: Feet supported;Bilateral upper extremity supported Sitting balance-Leahy Scale: Poor Sitting balance - Comments: reliant on bil UE support or light support from therapist at trunk in sitting EOB.   Standing balance support: Bilateral upper extremity supported Standing balance-Leahy Scale: Poor Standing balance comment: Two person mod assist to stand from elevated bed.  Cognition Arousal/Alertness: Awake/alert Behavior During Therapy: Restless;Impulsive (mildly inpulsive at EOB) Overall Cognitive Status: Impaired/Different from baseline Area of Impairment:  Attention;Safety/judgement;Awareness;Problem solving                   Current Attention Level: Sustained Memory: Decreased recall of precautions   Safety/Judgement: Decreased awareness of safety;Decreased awareness of deficits Awareness: Emergent Problem Solving: Difficulty sequencing;Requires verbal cues;Requires tactile cues General Comments: Pt pointed at his right leg indicating it was injured at some point, needed reinforcement of NWB R leg in standing and assist to maintain, def sustained and attempts at selective attention as he had multiple providers helping during session.        Exercises      General Comments General comments (skin integrity, edema, etc.): Pt in PRVC FiO2 40% PEEP 5, some coughing and pressure alarms, RR mostly in the low 30s and HR 110s.      Pertinent Vitals/Pain Pain Assessment: Faces Faces Pain Scale: Hurts little more Pain Location: back Pain Intervention(s): Limited activity within patient's tolerance;Monitored during session;Repositioned    Home Living                          Prior Function            PT Goals (current goals can now be found in the care plan section) Acute Rehab PT Goals Patient Stated Goal: breathe better and speak Progress towards PT goals: Progressing toward goals    Frequency    Min 3X/week      PT Plan Current plan remains appropriate    Co-evaluation PT/OT/SLP Co-Evaluation/Treatment: Yes Reason for Co-Treatment: Complexity of the patient's impairments (multi-system involvement);Necessary to address cognition/behavior during functional activity;For patient/therapist safety;To address functional/ADL transfers PT goals addressed during session: Mobility/safety with mobility;Balance;Proper use of DME;Strengthening/ROM        AM-PAC PT "6 Clicks" Mobility   Outcome Measure  Help needed turning from your back to your side while in a flat bed without using bedrails?: Total Help needed  moving from lying on your back to sitting on the side of a flat bed without using bedrails?: Total Help needed moving to and from a bed to a chair (including a wheelchair)?: Total Help needed standing up from a chair using your arms (e.g., wheelchair or bedside chair)?: Total Help needed to walk in hospital room?: Total Help needed climbing 3-5 steps with a railing? : Total 6 Click Score: 6    End of Session Equipment Utilized During Treatment: Oxygen Activity Tolerance: Patient limited by fatigue Patient left: in bed;with call bell/phone within reach;with nursing/sitter in room Nurse Communication: Mobility status PT Visit Diagnosis: Muscle weakness (generalized) (M62.81);Pain;Unsteadiness on feet (R26.81) Pain - Right/Left: Right Pain - part of body: Hip     Time: 8366-2947 PT Time Calculation (min) (ACUTE ONLY): 39 min  Charges:  $Therapeutic Activity: 8-22 mins                     Corinna Capra, PT, DPT  Acute Rehabilitation Ortho Tech Supervisor [phone] pager [phone]) [phone] office

## 2021-08-26 NOTE — Progress Notes (Signed)
Patient ID: [name], male   DOB: [birth year], 65 y.o.   MRN: [id number] Follow up - Trauma Critical Care  Patient Details:    [name] is an 65 y.o. male.  Lines/tubes : Urethral Catheter [name], RN 16 Fr. (Active)  Indication for Insertion or Continuance of Catheter Acute urinary retention (I&O Cath for 24 hrs prior to catheter insertion- Inpatient Only) 08/25/21 2000  Site Assessment Clean;Intact 08/25/21 2000  Catheter Maintenance Bag below level of bladder;Catheter secured;Drainage bag/tubing not touching floor;No dependent loops;Insertion date on drainage bag 08/25/21 2000  Collection Container Standard drainage bag 08/25/21 2000  Securement Method Securing device (Describe) 08/25/21 2000  Urinary Catheter Interventions (if applicable) Unclamped 08/25/21 0800  Output (mL) 500 mL 08/26/21 0600    Microbiology/Sepsis markers: Results for orders placed or performed during the hospital encounter of 07/27/21  Resp Panel by RT-PCR (Flu A&B, Covid) Nasopharyngeal Swab     Status: None   Collection Time: 07/27/21  3:34 PM   Specimen: Nasopharyngeal Swab; Nasopharyngeal(NP) swabs in vial transport medium  Result Value Ref Range Status   SARS Coronavirus 2 by RT PCR NEGATIVE NEGATIVE Final    Comment: (NOTE) SARS-CoV-2 target nucleic acids are NOT DETECTED.  The SARS-CoV-2 RNA is generally detectable in upper respiratory specimens during the acute phase of infection. The lowest concentration of SARS-CoV-2 viral copies this assay can detect is 138 copies/mL. A negative result does not preclude SARS-Cov-2 infection and should not be used as the sole basis for treatment or other patient management decisions. A negative result may occur with  improper specimen collection/handling, submission of specimen other than nasopharyngeal swab, presence of viral mutation(s) within the areas targeted by this assay, and inadequate number of viral copies(<138 copies/mL). A  negative result must be combined with clinical observations, patient history, and epidemiological information. The expected result is Negative.  Fact Sheet for Patients:  BloggerCourse.com  Fact Sheet for Healthcare Providers:  SeriousBroker.it  This test is no t yet approved or cleared by the [place] FDA and  has been authorized for detection and/or diagnosis of SARS-CoV-2 by FDA under an Emergency Use Authorization (EUA). This EUA will remain  in effect (meaning this test can be used) for the duration of the COVID-19 declaration under Section 564(b)(1) of the Act, 21 U.S.C.section 360bbb-3(b)(1), unless the authorization is terminated  or revoked sooner.       Influenza A by PCR NEGATIVE NEGATIVE Final   Influenza B by PCR NEGATIVE NEGATIVE Final    Comment: (NOTE) The Xpert Xpress SARS-CoV-2/FLU/RSV plus assay is intended as an aid in the diagnosis of influenza from Nasopharyngeal swab specimens and should not be used as a sole basis for treatment. Nasal washings and aspirates are unacceptable for Xpert Xpress SARS-CoV-2/FLU/RSV testing.  Fact Sheet for Patients: BloggerCourse.com  Fact Sheet for Healthcare Providers: SeriousBroker.it  This test is not yet approved or cleared by the [place] FDA and has been authorized for detection and/or diagnosis of SARS-CoV-2 by FDA under an Emergency Use Authorization (EUA). This EUA will remain in effect (meaning this test can be used) for the duration of the COVID-19 declaration under Section 564(b)(1) of the Act, 21 U.S.C. section 360bbb-3(b)(1), unless the authorization is terminated or revoked.  Performed at [facility], [address] [place], [place] [address]   Surgical PCR screen     Status: None   Collection Time: 07/27/21  5:54 PM   Specimen: Nasal Mucosa; Nasal Swab  Result Value Ref Range Status    MRSA, PCR NEGATIVE NEGATIVE Final   Staphylococcus aureus NEGATIVE NEGATIVE Final    Comment: (NOTE) The Xpert SA Assay (FDA approved for NASAL specimens in patients 88 years of age and older), is one component of a comprehensive surveillance program. It is not intended to diagnose infection nor to guide or monitor treatment. Performed at [facility], [address] [place], [place] [address]   Culture, Respiratory w Gram Stain     Status: None   Collection Time: 08/04/21 11:53 AM   Specimen: Tracheal Aspirate; Respiratory  Result Value Ref Range Status   Specimen Description TRACHEAL ASPIRATE  Final   Special Requests NONE  Final   Gram Stain   Final    FEW SQUAMOUS EPITHELIAL CELLS PRESENT FEW WBC SEEN FEW GRAM POSITIVE COCCI MODERATE GRAM NEGATIVE RODS    Culture   Final    ABUNDANT HAEMOPHILUS INFLUENZAE BETA LACTAMASE NEGATIVE Performed at [facility], [address] [place], [place] [address]    Report Status 08/05/2021 FINAL  Final  Culture, Respiratory w Gram Stain     Status: None   Collection Time: 08/18/21  8:22 AM   Specimen: Tracheal Aspirate; Respiratory  Result Value Ref Range Status   Specimen Description TRACHEAL ASPIRATE  Final   Special Requests NONE  Final   Gram Stain   Final    NO WBC SEEN FEW GRAM VARIABLE ROD RARE GRAM POSITIVE COCCI IN PAIRS Performed at [facility], [address] [place], [place] [address]    Culture   Final    MODERATE SERRATIA MARCESCENS MODERATE STENOTROPHOMONAS MALTOPHILIA    Report Status 08/21/2021 FINAL  Final   Organism ID, Bacteria SERRATIA MARCESCENS  Final   Organism ID, Bacteria STENOTROPHOMONAS MALTOPHILIA  Final      Susceptibility   Stenotrophomonas maltophilia - MIC*    LEVOFLOXACIN 0.5 SENSITIVE Sensitive     TRIMETH/SULFA <=20 SENSITIVE Sensitive     * MODERATE STENOTROPHOMONAS MALTOPHILIA   Serratia marcescens - MIC*    CEFAZOLIN >=64 RESISTANT Resistant     CEFEPIME <=0.12  SENSITIVE Sensitive     CEFTAZIDIME <=1 SENSITIVE Sensitive     CEFTRIAXONE <=0.25 SENSITIVE Sensitive     CIPROFLOXACIN <=0.25 SENSITIVE Sensitive     GENTAMICIN <=1 SENSITIVE Sensitive     TRIMETH/SULFA <=20 SENSITIVE Sensitive     * MODERATE SERRATIA MARCESCENS    Anti-infectives:  Anti-infectives (From admission, onward)    Start     Dose/Rate Route Frequency Ordered Stop   08/23/21 1630  sulfamethoxazole-trimethoprim (BACTRIM) 319.04 mg in dextrose 5 % 500 mL IVPB        10 mg/kg/day  95.7 kg (Adjusted) 346.6 mL/hr over 90 Minutes Intravenous Every 8 hours 08/23/21 1531     08/20/21 1215  levofloxacin (LEVAQUIN) IVPB 750 mg  Status:  Discontinued        750 mg 100 mL/hr over 90 Minutes Intravenous Daily 08/20/21 1126 08/23/21 1531   08/06/21 0915  cefTRIAXone (ROCEPHIN) 2 g in sodium chloride 0.9 % 100 mL IVPB        2 g 200 mL/hr over 30 Minutes Intravenous Every 24 hours 08/06/21 0818 08/12/21 1103   07/29/21 1800  ceFAZolin (ANCEF) IVPB 2g/100 mL premix        2 g 200 mL/hr over 30 Minutes Intravenous Every 8 hours 07/29/21 1241 07/30/21 1112   07/28/21 0815  ceFAZolin (ANCEF) IVPB 2g/100 mL premix  Status:  Discontinued        2 g 200 mL/hr over 30 Minutes Intravenous On call to O.R. 07/27/21 1744 07/28/21 0900       Best Practice/Protocols:  VTE Prophylaxis: Lovenox (prophylaxtic dose) Intermittent Sedation  Consults: Treatment Team:  Altamese Sandy, MD    Studies:    Events:  Subjective:    Overnight Issues:   Objective:  Vital signs for last 24 hours: Temp:  [98.4 F (36.9 C)-99.7 F (37.6 C)] 99.7 F (37.6 C) (12/08 0800) Pulse Rate:  [87-110] 96 (12/08 0810) Resp:  [16-31] 22 (12/08 0810) BP: (96-154)/(65-92) 122/78 (12/08 0753) SpO2:  [94 %-100 %] 99 % (12/08 0810) FiO2 (%):  [40 %-60 %] 40 % (12/08 0810) Weight:  [110.7 kg] 110.7 kg (12/08 0600)  Hemodynamic parameters for last 24 hours:    Intake/Output from previous day: 12/07  0701 - 12/08 0700 In: 3079.5 [NG/GT:1975; IV Piggyback:1104.5] Out: 2875 [Urine:2875]  Intake/Output this shift: No intake/output data recorded.  Vent settings for last 24 hours: Vent Mode: PRVC FiO2 (%):  [40 %-60 %] 40 % Set Rate:  [16 bmp] 16 bmp Vt Set:  [650 mL] 650 mL PEEP:  [5 cmH20] 5 cmH20 Plateau Pressure:  [20 cmH20-22 cmH20] 20 cmH20  Physical Exam:  General: alert Neuro: awake and F/C HEENT/Neck: trach-clean, intact Resp: rhonchi bilaterally CVS: RRR GI: soft, nontender, BS WNL, no r/g Extremities: edema 1+  Results for orders placed or performed during the hospital encounter of 07/27/21 (from the past 24 hour(s))  Glucose, capillary     Status: Abnormal   Collection Time: 08/25/21 12:12 PM  Result Value Ref Range   Glucose-Capillary 106 (H) 70 - 99 mg/dL  Glucose, capillary     Status: Abnormal   Collection Time: 08/25/21  3:23 PM  Result Value Ref Range   Glucose-Capillary 132 (H) 70 - 99 mg/dL  Glucose, capillary     Status: Abnormal   Collection Time: 08/25/21  7:37 PM  Result Value Ref Range   Glucose-Capillary 122 (H) 70 - 99 mg/dL  Glucose, capillary     Status: Abnormal   Collection Time: 08/25/21 11:04 PM  Result Value Ref Range   Glucose-Capillary 112 (H) 70 - 99 mg/dL  Glucose, capillary     Status: Abnormal   Collection Time: 08/26/21  3:22 AM  Result Value Ref Range   Glucose-Capillary 112 (H) 70 - 99 mg/dL  Glucose, capillary     Status: Abnormal   Collection Time: 08/26/21  7:48 AM  Result Value Ref Range   Glucose-Capillary 119 (H) 70 - 99 mg/dL    Assessment & Plan: Present on Admission:  Fall    LOS: 30 days   Additional comments:I reviewed the patient's new clinical lab test results. . Fall off ladder   Acute hypoxic ventilator dependent respiratory failure - intubated 11/9, self-extubated and promptly re-intubated 11/12 AM. Extubated and reintubated 11/21. Trach 11/22, now back on vent, continue robinul and guaifenesin. HTC  as able (on it now)- in line PMV Agitation/delirium - ongoing, appreciate Psychiatry assist  R PTX - resolved R rib fractures - aggressive multimodal pain control, IS, pulm toilet Pelvic fx (L sacrum, R iliac, b/l inf and L sup pubic rami) with pelvic hematoma - ortho c/s, Dr. Marcelino Scot, SI screw 11/10 H/o alcohol abuse  COPD - prn duonebs, guaifenesin, trach 11/22, see above Hyperglycemia - SSI, glargine 5u, glc well-controlled Hypokalemia - monitor and replace if needed Anemia - stable, monitor HTN -  home lisinopril held for low BP Hx of Hep C  FEN: NPO, replace cortrak, +BM DVT - SCDs, LMWH Foley - replaced for retention 11/17, 11/22, 11/29 Hematuria - U/A with micro now, CBC ID - CTX x7d for H flu in 11/16 resp cx, end 11/24; repeat resp cx/CXR sent 11/30 with Serratia and Steno, changed to Bactrim to complete 7d Dispo - ICU, HTC as able, TOC working on [facility] placement Critical Care Total Time*: 34 Minutes  Georganna Skeans, MD, MPH, FACS Trauma & General Surgery Use [email or web address] to contact on call provider  08/26/2021  *Care during the described time interval was provided by me. I have reviewed this patient's available data, including medical history, events of note, physical examination and test results as part of my evaluation.

## 2021-08-26 NOTE — Progress Notes (Signed)
SLP Cancellation Note  Patient Details Name: [name] MRN: [id number] DOB: 29-Mar-1956   Cancelled treatment:       Reason Eval/Treat Not Completed: Medical issues which prohibited therapy. Pt on TC at the moment but needing to be suctioned 2x already this hour per RN, who suggests holding PMV trials at this time. Will continue to f/u for use.     Mahala Menghini., M.A. CCC-SLP Acute Rehabilitation Services Pager [phone] Office [phone]  08/26/2021, 9:45 AM

## 2021-08-26 NOTE — Plan of Care (Signed)
  Problem: Education: Goal: Knowledge of General Education information will improve Description: Including pain rating scale, medication(s)/side effects and non-pharmacologic comfort measures Outcome: Progressing   Problem: Health Behavior/Discharge Planning: Goal: Ability to manage health-related needs will improve Outcome: Progressing   Problem: Clinical Measurements: Goal: Ability to maintain clinical measurements within normal limits will improve Outcome: Progressing   Problem: Activity: Goal: Risk for activity intolerance will decrease Outcome: Progressing   Problem: Nutrition: Goal: Adequate nutrition will be maintained Outcome: Progressing   Problem: Coping: Goal: Level of anxiety will decrease Outcome: Progressing   Problem: Elimination: Goal: Will not experience complications related to bowel motility Outcome: Progressing Goal: Will not experience complications related to urinary retention Outcome: Progressing   

## 2021-08-26 NOTE — Progress Notes (Signed)
Large amount of hematuria noted from Foley. Dr. [name] aware, orders noted.

## 2021-08-27 LAB — CBC
HCT: 27.4 % — ABNORMAL LOW (ref 39.0–52.0)
Hemoglobin: 9 g/dL — ABNORMAL LOW (ref 13.0–17.0)
MCH: 33.1 pg (ref 26.0–34.0)
MCHC: 32.8 g/dL (ref 30.0–36.0)
MCV: 100.7 fL — ABNORMAL HIGH (ref 80.0–100.0)
Platelets: 204 10*3/uL (ref 150–400)
RBC: 2.72 MIL/uL — ABNORMAL LOW (ref 4.22–5.81)
RDW: 13.8 % (ref 11.5–15.5)
WBC: 5.2 10*3/uL (ref 4.0–10.5)
nRBC: 0 % (ref 0.0–0.2)

## 2021-08-27 LAB — GLUCOSE, CAPILLARY
Glucose-Capillary: 107 mg/dL — ABNORMAL HIGH (ref 70–99)
Glucose-Capillary: 115 mg/dL — ABNORMAL HIGH (ref 70–99)
Glucose-Capillary: 117 mg/dL — ABNORMAL HIGH (ref 70–99)
Glucose-Capillary: 118 mg/dL — ABNORMAL HIGH (ref 70–99)
Glucose-Capillary: 125 mg/dL — ABNORMAL HIGH (ref 70–99)
Glucose-Capillary: 139 mg/dL — ABNORMAL HIGH (ref 70–99)

## 2021-08-27 LAB — BASIC METABOLIC PANEL
Anion gap: 9 (ref 5–15)
BUN: 17 mg/dL (ref 8–23)
CO2: 26 mmol/L (ref 22–32)
Calcium: 8.7 mg/dL — ABNORMAL LOW (ref 8.9–10.3)
Chloride: 99 mmol/L (ref 98–111)
Creatinine, Ser: 0.72 mg/dL (ref 0.61–1.24)
GFR, Estimated: >60 mL/min (ref >60)
Glucose, Bld: 115 mg/dL — ABNORMAL HIGH (ref 70–99)
Potassium: 4.4 mmol/L (ref 3.5–5.1)
Sodium: 134 mmol/L — ABNORMAL LOW (ref 135–145)

## 2021-08-27 MED ORDER — TRAZODONE HCL 50 MG PO TABS
25.0000 mg | ORAL_TABLET | Freq: Every day | ORAL | Status: DC
  Filled 2021-08-27 (×2): qty 1

## 2021-08-27 MED ORDER — FUROSEMIDE 10 MG/ML IJ SOLN
40.0000 mg | Freq: Once | INTRAMUSCULAR | Status: AC
  Administered 2021-08-27: 40 mg via INTRAVENOUS
  Filled 2021-08-27: qty 4

## 2021-08-27 MED ORDER — FOSFOMYCIN TROMETHAMINE 3 G PO PACK: 3.0000 g | PACK | Freq: Every day | ORAL | Status: DC

## 2021-08-27 NOTE — Progress Notes (Signed)
Occupational Therapy Treatment Patient Details Name: [name] MRN: [id number] DOB: September 24, 1955 Today's Date: 08/27/2021   History of present illness 65 y/o male presented to ED on 07/27/21 after fall off 8 foot ladder. CT chest found small R apical pneumothorax, multiple displaced posterior R rib fxs, nondisplaced R iliac wing fx with widening of R SI joint, displaced fx both inferior pubic rami and L superior pubic ramus, and nondisplaced L sacral fx, SI screw 07/29/21. intubated 11/9, trach 11/22. Trach collar 11/30, back on vent on/off. PMH: COPD, EtOH abuse, HTN, GERD, hep C   OT comments  Pt tolerated progressed to chair this session with goal of 1 hour in chair. Recommend hoyer lift back to bed with RN staff. Pt tolerated R LE on the floor with updated orders from ortho WBAT for transfers only R LE. Pt sitting prematurely. Pt pivot toward the L this session. Recommend working on sliding transfers for decreased dependence in addition to stand pivot. Recommendation vent based care that this time due to trach/ vent care needed.    Recommendations for follow up therapy are one component of a multi-disciplinary discharge planning process, led by the attending physician.  Recommendations may be updated based on patient status, additional functional criteria and insurance authorization.    Follow Up Recommendations  OT at Long-term acute care hospital    Assistance Recommended at Discharge Frequent or constant Supervision/Assistance  Equipment Recommendations  BSC/3in1;Tub/shower bench;Wheelchair (measurements OT);Wheelchair cushion (measurements OT);Hospital bed    Recommendations for Other Services      Precautions / Restrictions Precautions Precautions: Fall Precaution Comments: foley, trach, cortrak, anxious Restrictions Weight Bearing Restrictions: Yes RLE Weight Bearing: Weight bearing as tolerated (transfers only) LLE Weight Bearing: Weight bearing as tolerated        Mobility Bed Mobility Overal bed mobility: Needs Assistance Bed Mobility: Supine to Sit     Supine to sit: Mod assist;HOB elevated     General bed mobility comments: pt given cues to bring one leg at a time. pt once eob able to assist with L ue to push into sitting. Pt static sitting min guard (A).    Transfers                         Balance                                           ADL either performed or assessed with clinical judgement   ADL Overall ADL's : Needs assistance/impaired Eating/Feeding: NPO Eating/Feeding Details (indicate cue type and reason): having ice chips with SLP present this session Grooming: Maximal assistance             Upper Body Dressing Details (indicate cue type and reason): pt noted to have body rash with gown change. RN in room and made aware. pt reports being "itchy"     Toilet Transfer: +2 for physical assistance;Maximal assistance;Stand-pivot             General ADL Comments: pt oob to chair with stand pivot toward L    Extremity/Trunk Assessment Upper Extremity Assessment Upper Extremity Assessment: Generalized weakness            Vision       Perception     Praxis      Cognition Arousal/Alertness: Awake/alert Behavior During Therapy: Restless;Anxious Overall Cognitive Status:  Impaired/Different from baseline Area of Impairment: Attention;Memory;Safety/judgement;Awareness                   Current Attention Level: Sustained Memory: Decreased recall of precautions;Decreased short-term memory   Safety/Judgement: Decreased awareness of safety;Decreased awareness of deficits Awareness: Intellectual Problem Solving: Slow processing;Difficulty sequencing General Comments: pt verbalized anxiety 6 out 10. pt reports anxiety being a factor in his care. Pt fixated on needing to void bladder and shown foley twice during session. pt remains internally distracted. pt OOB to chair to  help with orientation and anxiety          Exercises Other Exercises Other Exercises: daughter [name] present during session Other Exercises: psych arriving during session and requires OT / SLP to help maintain chair position   Shoulder Instructions       General Comments trach collar  FIO2 40% 10L. Pt    Pertinent Vitals/ Pain       Pain Assessment: No/denies pain  Home Living                                          Prior Functioning/Environment              Frequency  Min 2X/week        Progress Toward Goals  OT Goals(current goals can now be found in the care plan section)  Progress towards OT goals: Progressing toward goals  Acute Rehab OT Goals Patient Stated Goal: to pee OT Goal Formulation: With patient/family Time For Goal Achievement: 08/31/21 Potential to Achieve Goals: Good ADL Goals Pt Will Perform Grooming: with set-up;sitting Pt Will Perform Upper Body Bathing: with min assist;sitting Pt/caregiver will Perform Home Exercise Program: With written HEP provided;With Supervision;With theraband;Increased strength;Both right and left upper extremity Additional ADL Goal #1: pt will tolerate sitting with SBA ~10 minutes at EOB for increased indep with seated self care tasks Additional ADL Goal #2: pt will complete bed mobility including both rolling and supine<>sitting with min A in prep for participation with self care tasks  Plan Discharge plan remains appropriate    Co-evaluation    PT/OT/SLP Co-Evaluation/Treatment: Yes     OT goals addressed during session: ADL's and self-care;Proper use of Adaptive equipment and DME;Strengthening/ROM      AM-PAC OT "6 Clicks" Daily Activity     Outcome Measure   Help from another person eating meals?: A Lot Help from another person taking care of personal grooming?: A Little Help from another person toileting, which includes using toliet, bedpan, or urinal?: A Lot Help from another  person bathing (including washing, rinsing, drying)?: A Lot Help from another person to put on and taking off regular upper body clothing?: A Lot Help from another person to put on and taking off regular lower body clothing?: Total 6 Click Score: 12    End of Session Equipment Utilized During Treatment: Oxygen  OT Visit Diagnosis: Muscle weakness (generalized) (M62.81);Other abnormalities of gait and mobility (R26.89);History of falling (Z91.81);Other symptoms and signs involving cognitive function;Dizziness and giddiness (R42);Pain   Activity Tolerance Patient tolerated treatment well   Patient Left in chair;with call bell/phone within reach;with chair alarm set   Nurse Communication Mobility status;Precautions        Time: 8250-5397 OT Time Calculation (min): 54 min  Charges: OT General Charges $OT Visit: 1 Visit OT Treatments $Self Care/Home Management : 38-52 mins  Timmothy Euler, OTR/L  Acute Rehabilitation Services Pager: [phone] Office: [phone] .   Mateo Flow 08/27/2021, 10:56 AM

## 2021-08-27 NOTE — Evaluation (Addendum)
Passy-Muir Speaking Valve treatment:  (PRogress note, not evaluation) Patient Details  Name: [name] MRN: [id number] Date of Birth: 25-Nov-1955  Today's Date: 08/27/2021 Time: 0940-1010 SLP Time Calculation (min) (ACUTE ONLY): 30 min  Past Medical History:  Past Medical History:  Diagnosis Date   COPD (chronic obstructive pulmonary disease) (HCC)    GERD (gastroesophageal reflux disease)    History of hepatitis C    treated several years ago through [facility]   Hypertension    Past Surgical History:  Past Surgical History:  Procedure Laterality Date   CATARACT EXTRACTION, BILATERAL     EYE SURGERY Bilateral    ORIF PELVIC FRACTURE WITH PERCUTANEOUS SCREWS Right 07/29/2021   Procedure: SI SCREW FIXATION OF RIGHT PELVIC RING;  Surgeon: Myrene Galas, MD;  Location: [facility] OR;  Service: Orthopedics;  Laterality: Right;   ORTHOPEDIC SURGERY Bilateral    pt states fracture repairs to arms and legs.   REVERSE SHOULDER ARTHROPLASTY Left 09/23/2019   Procedure: REVERSE SHOULDER ARTHROPLASTY;  Surgeon: Jones Broom, MD;  Location: [facility] ORS;  Service: Orthopedics;  Laterality: Left;   TRACHEOSTOMY TUBE PLACEMENT N/A 08/10/2021   Procedure: TRACHEOSTOMY;  Surgeon: Violeta Gelinas, MD;  Location[facility];  Service: General;  Laterality: N/A;   HPI:  65 y/o male presented to ED on 11/8 after fall off 8 foot ladder. CT chest found small R apical pneumothorax, multiple displaced posterior R rib fxs, nondisplaced R iliac wing fx with widening of R SI joint, displaced fx both inferior pubic rami and L superior pubic ramus, and nondisplaced L sacral fx, SI screw 07/29/21. intubated 11/9, trach 11/22. Trach collar 11/30 PMH: COPD, EtOH abuse, HTN, GERD, hep C    Assessment / Plan / Recommendation  Clinical Impression  Co-treatment with OT today.  Pt was tolerating TC upon arrival to room; daughter at bedside.  Cuff was deflated and PMV in place.  Oriented x4.  Required intermittent cues throughout  session to use voice rather than gestrue.  Phonation had great volume; quality was mildly hoarse.  Intelligibility was >90%. Required verbal/visual cues to inhale through trach and exhale orally.  SP02 ranged from 77-91% throughout use; RR ranged from 20-40. Valve intermittently removed when VS showed large fluctuations.  Pt expressed anxiety and was internally distracted by urgency to urinate. Psychiatry arrived during session. Pt was able to communicate verbally and engage in conversation with MD.  PMV was removed prior to leaving pt to allow him an opportunity to rest. D/W pt, dtr, and RN.  SLP will continue to follow for PMV. SLP Visit Diagnosis: Aphonia (R49.1)      Patient needs continued Speech Lanaguage Pathology Services    Recommendations for follow up therapy are one component of a multi-disciplinary discharge planning process, led by the attending physician.  Recommendations may be updated based on patient status, additional functional criteria and insurance authorization.  Follow Up Recommendations  Acute inpatient rehab (3hours/day)    Assistance Recommended at Discharge Frequent or constant Supervision/Assistance  Functional Status Assessment Patient has had a recent decline in their functional status and demonstrates the ability to make significant improvements in function in a reasonable and predictable amount of time.  Frequency and Duration min 2x/week  2 weeks    PMSV Trial PMSV was placed for: 60 Able to redirect subglottic air through upper airway: Yes Able to Attain Phonation: Yes Voice Quality: Hoarse Able to Expectorate Secretions: Yes Level of Secretion Expectoration with PMSV: Oral Breath Support for Phonation: Mildly decreased  Intelligibility: Intelligible Sentence: 75-100% accurate Conversation: 75-100% accurate Respirations During Trial:  (20-40) SpO2 During Trial:  (77-91) Pulse During Trial: 100 Behavior: Alert;Cooperative   Tracheostomy Tube   Additional Tracheostomy Tube Assessment Fenestrated: No    Vent Dependency  Vent Dependent: No FiO2 (%): 40 %    Cuff Deflation Trial Tolerated Cuff Deflation: Yes   Charles Lantry L. Samson Frederic, MA CCC/SLP Acute Rehabilitation Services Office number [phone] Pager [phone]          [name] 08/27/2021, 10:58 AM

## 2021-08-27 NOTE — TOC Progression Note (Signed)
Transition of Care [facility]) - Progression Note    Patient Details  Name: [name] MRN: [id number] Date of Birth: [birth year]  Transition of Care [facility]) CM/SW Contact  Glennon Mac, RN Phone Number: 08/27/2021, 1:43 PM  Clinical Narrative:    Insurance authorization for LTAC in progress; no updates available presently.  Will continue to follow/provide updates as available.     Expected Discharge Plan: Long Term Acute Care (LTAC) Barriers to Discharge: Continued Medical Work up  Expected Discharge Plan and Services Expected Discharge Plan: Long Term Acute Care (LTAC)   Discharge Planning Services: CM Consult Post Acute Care Choice: Long Term Acute Care (LTAC) Living arrangements for the past 2 months: Single Family Home                                       Social Determinants of Health (SDOH) Interventions    Readmission Risk Interventions No flowsheet data found.  [name], RN, BSN  Trauma/Neuro ICU Case Manager [phone]

## 2021-08-27 NOTE — Progress Notes (Addendum)
Patient ID: [name], male   DOB: Sep 26, 1955, 65 y.o.   MRN: [id number] Follow up - Trauma Critical Care  Patient Details:    [name] is an 65 y.o. male.  Lines/tubes : Urethral Catheter Wyn Quaker, RN 16 Fr. (Active)  Indication for Insertion or Continuance of Catheter Acute urinary retention (I&O Cath for 24 hrs prior to catheter insertion- Inpatient Only) 08/27/21 0800  Site Assessment Clean;Intact 08/26/21 2000  Catheter Maintenance Bag below level of bladder;Catheter secured;Drainage bag/tubing not touching floor;Insertion date on drainage bag;No dependent loops;Seal intact 08/26/21 2000  Collection Container Standard drainage bag 08/26/21 2000  Securement Method Securing device (Describe) 08/26/21 2000  Urinary Catheter Interventions (if applicable) Unclamped 0000000 0800  Output (mL) 300 mL 08/27/21 0800    Microbiology/Sepsis markers: Results for orders placed or performed during the hospital encounter of 07/27/21  Resp Panel by RT-PCR (Flu A&B, Covid) Nasopharyngeal Swab     Status: None   Collection Time: 07/27/21  3:34 PM   Specimen: Nasopharyngeal Swab; Nasopharyngeal(NP) swabs in vial transport medium  Result Value Ref Range Status   SARS Coronavirus 2 by RT PCR NEGATIVE NEGATIVE Final    Comment: (NOTE) SARS-CoV-2 target nucleic acids are NOT DETECTED.  The SARS-CoV-2 RNA is generally detectable in upper respiratory specimens during the acute phase of infection. The lowest concentration of SARS-CoV-2 viral copies this assay can detect is 138 copies/mL. A negative result does not preclude SARS-Cov-2 infection and should not be used as the sole basis for treatment or other patient management decisions. A negative result may occur with  improper specimen collection/handling, submission of specimen other than nasopharyngeal swab, presence of viral mutation(s) within the areas targeted by this assay, and inadequate number of viral copies(<138  copies/mL). A negative result must be combined with clinical observations, patient history, and epidemiological information. The expected result is Negative.  Fact Sheet for Patients:  EntrepreneurPulse.com.au  Fact Sheet for Healthcare Providers:  IncredibleEmployment.be  This test is no t yet approved or cleared by the [place] FDA and  has been authorized for detection and/or diagnosis of SARS-CoV-2 by FDA under an Emergency Use Authorization (EUA). This EUA will remain  in effect (meaning this test can be used) for the duration of the COVID-19 declaration under Section 564(b)(1) of the Act, 21 U.S.C.section 360bbb-3(b)(1), unless the authorization is terminated  or revoked sooner.       Influenza A by PCR NEGATIVE NEGATIVE Final   Influenza B by PCR NEGATIVE NEGATIVE Final    Comment: (NOTE) The Xpert Xpress SARS-CoV-2/FLU/RSV plus assay is intended as an aid in the diagnosis of influenza from Nasopharyngeal swab specimens and should not be used as a sole basis for treatment. Nasal washings and aspirates are unacceptable for Xpert Xpress SARS-CoV-2/FLU/RSV testing.  Fact Sheet for Patients: EntrepreneurPulse.com.au  Fact Sheet for Healthcare Providers: IncredibleEmployment.be  This test is not yet approved or cleared by the [place] FDA and has been authorized for detection and/or diagnosis of SARS-CoV-2 by FDA under an Emergency Use Authorization (EUA). This EUA will remain in effect (meaning this test can be used) for the duration of the COVID-19 declaration under Section 564(b)(1) of the Act, 21 U.S.C. section 360bbb-3(b)(1), unless the authorization is terminated or revoked.  Performed at [facility], [place] [address] [place], [place] [address]   Surgical PCR screen     Status: None   Collection Time: 07/27/21  5:54 PM   Specimen: Nasal Mucosa; Nasal  Swab  Result Value Ref  Range Status   MRSA, PCR NEGATIVE NEGATIVE Final   Staphylococcus aureus NEGATIVE NEGATIVE Final    Comment: (NOTE) The Xpert SA Assay (FDA approved for NASAL specimens in patients 34 years of age and older), is one component of a comprehensive surveillance program. It is not intended to diagnose infection nor to guide or monitor treatment. Performed at [facility], [place] [address], [place], [place] [address]   Culture, Respiratory w Gram Stain     Status: None   Collection Time: 08/04/21 11:53 AM   Specimen: Tracheal Aspirate; Respiratory  Result Value Ref Range Status   Specimen Description TRACHEAL ASPIRATE  Final   Special Requests NONE  Final   Gram Stain   Final    FEW SQUAMOUS EPITHELIAL CELLS PRESENT FEW WBC SEEN FEW GRAM POSITIVE COCCI MODERATE GRAM NEGATIVE RODS    Culture   Final    ABUNDANT HAEMOPHILUS INFLUENZAE BETA LACTAMASE NEGATIVE Performed at [facility], [place] [address], [place], [place] [address]    Report Status 08/05/2021 FINAL  Final  Culture, Respiratory w Gram Stain     Status: None   Collection Time: 08/18/21  8:22 AM   Specimen: Tracheal Aspirate; Respiratory  Result Value Ref Range Status   Specimen Description TRACHEAL ASPIRATE  Final   Special Requests NONE  Final   Gram Stain   Final    NO WBC SEEN FEW GRAM VARIABLE ROD RARE GRAM POSITIVE COCCI IN PAIRS Performed at [facility], [place] [address] [place],  [address]    Culture   Final    MODERATE SERRATIA MARCESCENS MODERATE STENOTROPHOMONAS MALTOPHILIA    Report Status 08/21/2021 FINAL  Final   Organism ID, Bacteria SERRATIA MARCESCENS  Final   Organism ID, Bacteria STENOTROPHOMONAS MALTOPHILIA  Final      Susceptibility   Stenotrophomonas maltophilia - MIC*    LEVOFLOXACIN 0.5 SENSITIVE Sensitive     TRIMETH/SULFA <=20 SENSITIVE Sensitive     * MODERATE STENOTROPHOMONAS MALTOPHILIA   Serratia marcescens - MIC*    CEFAZOLIN >=64 RESISTANT Resistant      CEFEPIME <=0.12 SENSITIVE Sensitive     CEFTAZIDIME <=1 SENSITIVE Sensitive     CEFTRIAXONE <=0.25 SENSITIVE Sensitive     CIPROFLOXACIN <=0.25 SENSITIVE Sensitive     GENTAMICIN <=1 SENSITIVE Sensitive     TRIMETH/SULFA <=20 SENSITIVE Sensitive     * MODERATE SERRATIA MARCESCENS    Anti-infectives:  Anti-infectives (From admission, onward)    Start     Dose/Rate Route Frequency Ordered Stop   08/23/21 1630  sulfamethoxazole-trimethoprim (BACTRIM) 319.04 mg in dextrose 5 % 500 mL IVPB        10 mg/kg/day  95.7 kg (Adjusted) 346.6 mL/hr over 90 Minutes Intravenous Every 8 hours 08/23/21 1531 08/26/21 2258   08/20/21 1215  levofloxacin (LEVAQUIN) IVPB 750 mg  Status:  Discontinued        750 mg 100 mL/hr over 90 Minutes Intravenous Daily 08/20/21 1126 08/23/21 1531   08/06/21 0915  cefTRIAXone (ROCEPHIN) 2 g in sodium chloride 0.9 % 100 mL IVPB        2 g 200 mL/hr over 30 Minutes Intravenous Every 24 hours 08/06/21 0818 08/12/21 1103   07/29/21 1800  ceFAZolin (ANCEF) IVPB 2g/100 mL premix        2 g 200 mL/hr over 30 Minutes Intravenous Every 8 hours 07/29/21 1241 07/30/21 1112   07/28/21 0815  ceFAZolin (ANCEF) IVPB 2g/100 mL  premix  Status:  Discontinued        2 g 200 mL/hr over 30 Minutes Intravenous On call to O.R. 07/27/21 1744 07/28/21 0900        Consults: Treatment Team:  Alt[place], MD    Studies:    Events:  Subjective:    Overnight Issues:   Objective:  Vital signs for last 24 hours: Temp:  [98 F (36.7 C)-99.4 F (37.4 C)] 99.1 F (37.3 C) (12/09 0400) Pulse Rate:  [84-117] 88 (12/09 0825) Resp:  [16-30] 24 (12/09 0825) BP: (104-146)/(68-97) 122/78 (12/09 0800) SpO2:  [77 %-100 %] 92 % (12/09 0825) FiO2 (%):  [40 %] 40 % (12/09 0825) Weight:  [107.4 kg] 107.4 kg (12/09 0534)  Hemodynamic parameters for last 24 hours:    Intake/Output from previous day: 12/08 0701 - 12/09 0700 In: 3426.9 [NG/GT:1930; IV Piggyback:1496.9] Out: 5320  [Urine:5320]  Intake/Output this shift: Total I/O In: 760 [NG/GT:760] Out: 300 [Urine:300]  Vent settings for last 24 hours: Vent Mode: PRVC FiO2 (%):  [40 %] 40 % Set Rate:  [16 bmp] 16 bmp Vt Set:  [650 mL] 650 mL PEEP:  [5 cmH20] 5 cmH20 Plateau Pressure:  [18 cmH20-21 cmH20] 20 cmH20  Physical Exam:  General: alert and on HTC Neuro: awake and F/C, speaks with PMV HEENT/Neck: trach-clean, intact Resp: rhonchi bilaterally CVS: RRR GI: soft, nontender, BS WNL, no r/g Extremities: edema 1+  Results for orders placed or performed during the hospital encounter of 07/27/21 (from the past 24 hour(s))  CBC     Status: Abnormal   Collection Time: 08/26/21 11:09 AM  Result Value Ref Range   WBC 6.3 4.0 - 10.5 K/uL   RBC 3.03 (L) 4.22 - 5.81 MIL/uL   Hemoglobin 9.8 (L) 13.0 - 17.0 g/dL   HCT 31.5 (L) 39.0 - 52.0 %   MCV 104.0 (H) 80.0 - 100.0 fL   MCH 32.3 26.0 - 34.0 pg   MCHC 31.1 30.0 - 36.0 g/dL   RDW 13.7 11.5 - 15.5 %   Platelets 234 150 - 400 K/uL   nRBC 0.0 0.0 - 0.2 %  Glucose, capillary     Status: Abnormal   Collection Time: 08/26/21 12:05 PM  Result Value Ref Range   Glucose-Capillary 135 (H) 70 - 99 mg/dL  Urinalysis, Routine w reflex microscopic Urine, Catheterized     Status: Abnormal   Collection Time: 08/26/21 12:22 PM  Result Value Ref Range   Color, Urine RED (A) YELLOW   APPearance TURBID (A) CLEAR   Specific Gravity, Urine  1.005 - 1.030    TEST NOT REPORTED DUE TO COLOR INTERFERENCE OF URINE PIGMENT   pH  5.0 - 8.0    TEST NOT REPORTED DUE TO COLOR INTERFERENCE OF URINE PIGMENT   Glucose, UA (A) NEGATIVE mg/dL    TEST NOT REPORTED DUE TO COLOR INTERFERENCE OF URINE PIGMENT   Hgb urine dipstick (A) NEGATIVE    TEST NOT REPORTED DUE TO COLOR INTERFERENCE OF URINE PIGMENT   Bilirubin Urine (A) NEGATIVE    TEST NOT REPORTED DUE TO COLOR INTERFERENCE OF URINE PIGMENT   Ketones, ur (A) NEGATIVE mg/dL    TEST NOT REPORTED DUE TO COLOR INTERFERENCE OF  URINE PIGMENT   Protein, ur (A) NEGATIVE mg/dL    TEST NOT REPORTED DUE TO COLOR INTERFERENCE OF URINE PIGMENT   Nitrite (A) NEGATIVE    TEST NOT REPORTED DUE TO COLOR INTERFERENCE OF URINE PIGMENT   Leukocytes,Ua (A)  NEGATIVE    TEST NOT REPORTED DUE TO COLOR INTERFERENCE OF URINE PIGMENT  Urinalysis, Microscopic (reflex)     Status: Abnormal   Collection Time: 08/26/21 12:22 PM  Result Value Ref Range   RBC / HPF >50 0 - 5 RBC/hpf   WBC, UA 11-20 0 - 5 WBC/hpf   Bacteria, UA FEW (A) NONE SEEN   Squamous Epithelial / LPF 0-5 0 - 5   Ca Oxalate Crys, UA PRESENT   Glucose, capillary     Status: None   Collection Time: 08/26/21  3:30 PM  Result Value Ref Range   Glucose-Capillary 97 70 - 99 mg/dL  Glucose, capillary     Status: Abnormal   Collection Time: 08/26/21  7:21 PM  Result Value Ref Range   Glucose-Capillary 109 (H) 70 - 99 mg/dL  Glucose, capillary     Status: Abnormal   Collection Time: 08/26/21 11:11 PM  Result Value Ref Range   Glucose-Capillary 114 (H) 70 - 99 mg/dL  Glucose, capillary     Status: Abnormal   Collection Time: 08/27/21  3:16 AM  Result Value Ref Range   Glucose-Capillary 115 (H) 70 - 99 mg/dL  CBC     Status: Abnormal   Collection Time: 08/27/21  3:39 AM  Result Value Ref Range   WBC 5.2 4.0 - 10.5 K/uL   RBC 2.72 (L) 4.22 - 5.81 MIL/uL   Hemoglobin 9.0 (L) 13.0 - 17.0 g/dL   HCT 27.4 (L) 39.0 - 52.0 %   MCV 100.7 (H) 80.0 - 100.0 fL   MCH 33.1 26.0 - 34.0 pg   MCHC 32.8 30.0 - 36.0 g/dL   RDW 13.8 11.5 - 15.5 %   Platelets 204 150 - 400 K/uL   nRBC 0.0 0.0 - 0.2 %  Basic metabolic panel     Status: Abnormal   Collection Time: 08/27/21  3:39 AM  Result Value Ref Range   Sodium 134 (L) 135 - 145 mmol/L   Potassium 4.4 3.5 - 5.1 mmol/L   Chloride 99 98 - 111 mmol/L   CO2 26 22 - 32 mmol/L   Glucose, Bld 115 (H) 70 - 99 mg/dL   BUN 17 8 - 23 mg/dL   Creatinine, Ser 0.72 0.61 - 1.24 mg/dL   Calcium 8.7 (L) 8.9 - 10.3 mg/dL   GFR, Estimated  >60 >60 mL/min   Anion gap 9 5 - 15  Glucose, capillary     Status: Abnormal   Collection Time: 08/27/21  7:44 AM  Result Value Ref Range   Glucose-Capillary 107 (H) 70 - 99 mg/dL    Assessment & Plan: Present on Admission:  Fall    LOS: 31 days   Additional comments:I reviewed the patient's new clinical lab test results. . Fall off ladder   Acute hypoxic ventilator dependent respiratory failure - intubated 11/9, self-extubated and promptly re-intubated 11/12 AM. Extubated and reintubated 11/21. Trach 11/22, now back on vent, continue robinul and guaifenesin. HTC as able (on it now)-  PMV Agitation/delirium - ongoing, appreciate Psychiatry assist  R PTX - resolved R rib fractures - aggressive multimodal pain control, IS, pulm toilet Pelvic fx (L sacrum, R iliac, b/l inf and L sup pubic rami) with pelvic hematoma - ortho c/s, Dr. Marcelino Scot, SI screw 11/10 H/o alcohol abuse  COPD - prn duonebs, guaifenesin, trach 11/22, see above Hyperglycemia - SSI, glargine 5u, glc well-controlled Anemia - stable, monitor HTN - home lisinopril held for low BP Hx of Hep  C  FEN: NPO, TF, lasix x 1 DVT - SCDs, LMWH Foley - replaced for retention 11/17, 11/22, 11/29 Hematuria - U/A unrevealing, no S/S infection. Will ask Urology to consult. ID - CTX x7d for H flu in 11/16 resp cx, end 11/24; repeat resp cx/CXR sent 11/30 with Serratia and Steno, changed to Bactrim to complete 7d Dispo - ICU, HTC as able, TOC working on [employer] placement I spoke with his daughter at the bedside. Critical Care Total Time*: 40 Minutes  Violeta Gelinas, MD, MPH, FACS Trauma & General Surgery Use [email or web address] to contact on call provider  08/27/2021  *Care during the described time interval was provided by me. I have reviewed this patient's available data, including medical history, events of note, physical examination and test results as part of my evaluation.

## 2021-08-27 NOTE — Consult Note (Signed)
[facility] Face-to-Face Psychiatry Consult   Patient Identification: [name] MRN:  [id number] Principal Diagnosis: <principal problem not specified> Diagnosis:  Active Problems:   Fall   Anxiety disorder due to known physiological condition  Consulted by Reather Laurence MD Assessment  [name] is a 65 y.o. male admitted medically 07/27/2021  3:31 PM for fall from a ladder. Patient carries no previous  psychiatric diagnoses (per daughter hx EtOH use d/o anxiety)and has a past medical history of  COPD, HTN, GERD, and hx of Hep C. Psychiatry was consulted for concern from delirium and concern for increased anxiety when attempting to wean off of trach.     On initial examination, patient was too sedated to follow commands and could not be assessed with in depth. Patient's presentation and hx from daughter endorsed that patient was delirious in the ICU. Initial medication changes included introduction of hydroxyzine, to which pt had a good response. We have also tapered patient off of benzos (was on librium + klonopin) and reduced AM quetiapine. He has been intermittently able to communicate with a Passy Muir Valve. He unfortunately developed a pneumonia shortly afterwards, which resulted in a transfer back to ICU, large increase in secretions, and ventilator dependence. He is currently doing better from a medical perspective and again tolerating brief TCT. When on TCT, appears to be euthymic with intact sense of humor. Today focused on training pt to ask for hydroxyzine (made card) and addition of trazodone - large increase in anxiety at night possibly 2/2 poor sleep.    Meds Continue hydroxyzine 25 mg QID scheduled plus QID PRN for anxiety and secretions Continue melatonin 3 mg QHS Continue librium 10 mg for 1 dose, then 5 mg TID for 2 days, then stop librium Continue  quetiapine to 100/200 mg Start trazodone 25 QHS    CONTINUE Gabapentin per primary  Haldol 5 q6 PRN (last received 12/7,  prior to that 11/29)   Labs/workup folate, B12 wnl  Capacity: has capacity to allow daughter to manage worker's comp case (low risk decision with high upside for patient)   Will continue to follow  Total Time spent with patient: 20 minutes  Subjective:   [name] is a 65 y.o. male patient admitted with fall and subsequent trauma.  HPI:   Exam partially performed by Fatima Sanger PGY-II and discussed and staffed with myself.   Patient seen in ICU chair on TCT. He is alert and oriented (4/4 CAM-ICU, oriented to person/place/time/situation). Denied SI, HI, AH/VH. Discussed medications and changes above with pt and daughter at bedside. He endorses a lot of anxiety, much worse at night. On chart review had not gotten PRN hydroxyzine until this AM and got 0 doses yesterday. Created a card for pt that said "I am anxious" with 1st line being non-pharmacologic interventions, 2nd line being hydroxyzine, and 3rd line being ativan on the back and coached pt to use this.  Discussed worker's comp case briefly and he stated that he would like for his daughter to manage this while he is in the hospital.  Past Medical History:  Past Medical History:  Diagnosis Date  . COPD (chronic obstructive pulmonary disease) ([place])   . GERD (gastroesophageal reflux disease)   . History of hepatitis C    treated several years ago through [place]  . Hypertension     Past Surgical History:  Procedure Laterality Date  . CATARACT EXTRACTION, BILATERAL    . EYE SURGERY Bilateral   .  ORIF PELVIC FRACTURE WITH PERCUTANEOUS SCREWS Right 07/29/2021   Procedure: SI SCREW FIXATION OF RIGHT PELVIC RING;  Surgeon: Altamese Le[place], MD;  Location: [place];  Service: Orthopedics;  Laterality: Right;  . ORTHOPEDIC SURGERY Bilateral    pt states fracture repairs to arms and legs.  Marland Kitchen REVERSE SHOULDER ARTHROPLASTY Left 09/23/2019   Procedure: REVERSE SHOULDER ARTHROPLASTY;  Surgeon: Tania Ade, MD;  Location: [facility];   Service: Orthopedics;  Laterality: Left;  . TRACHEOSTOMY TUBE PLACEMENT N/A 08/10/2021   Procedure: TRACHEOSTOMY;  Surgeon: Georganna Skeans, MD;  Location: [facility] OR;  Service: General;  Laterality: N/A;   Family History:  Family History  Problem Relation Age of Onset  . Heart disease Mother   . Hypertension Father     Social History:  Social History   Substance and Sexual Activity  Alcohol Use Yes  . Alcohol/week: 42.0 standard drinks  . Types: 42 Cans of beer per week   Comment: daily - 6 drinks a day     Social History   Substance and Sexual Activity  Drug Use Yes  . Types: Marijuana   Comment: last marijuana 2-3 weeks ago    Social History   Socioeconomic History  . Marital status: Single    Spouse name: Not on file  . Number of children: 2  . Years of education: Not on file  . Highest education level: Not on file  Occupational History  . Occupation: Architect  Tobacco Use  . Smoking status: Former    Packs/day: 1.00    Years: 35.00    Pack years: 35.00    Types: Cigarettes    Start date: 63    Quit date: 10/01/2010    Years since quitting: 10.9  . Smokeless tobacco: Never  Vaping Use  . Vaping Use: Never used  Substance and Sexual Activity  . Alcohol use: Yes    Alcohol/week: 42.0 standard drinks    Types: 42 Cans of beer per week    Comment: daily - 6 drinks a day  . Drug use: Yes    Types: Marijuana    Comment: last marijuana 2-3 weeks ago  . Sexual activity: Not Currently  Other Topics Concern  . Not on file  Social History Narrative  . Not on file   Social Determinants of Health   Financial Resource Strain: Not on file  Food Insecurity: Not on file  Transportation Needs: Not on file  Physical Activity: Not on file  Stress: Not on file  Social Connections: Not on file   Additional Social History:    Allergies:  No Known Allergies  Labs:  Results for orders placed or performed during the hospital encounter of 07/27/21 (from the past  48 hour(s))  Glucose, capillary     Status: Abnormal   Collection Time: 08/25/21  7:37 PM  Result Value Ref Range   Glucose-Capillary 122 (H) 70 - 99 mg/dL    Comment: Glucose reference range applies only to samples taken after fasting for at least 8 hours.  Glucose, capillary     Status: Abnormal   Collection Time: 08/25/21 11:04 PM  Result Value Ref Range   Glucose-Capillary 112 (H) 70 - 99 mg/dL    Comment: Glucose reference range applies only to samples taken after fasting for at least 8 hours.  Glucose, capillary     Status: Abnormal   Collection Time: 08/26/21  3:22 AM  Result Value Ref Range   Glucose-Capillary 112 (H) 70 - 99 mg/dL  Comment: Glucose reference range applies only to samples taken after fasting for at least 8 hours.  Glucose, capillary     Status: Abnormal   Collection Time: 08/26/21  7:48 AM  Result Value Ref Range   Glucose-Capillary 119 (H) 70 - 99 mg/dL    Comment: Glucose reference range applies only to samples taken after fasting for at least 8 hours.  CBC     Status: Abnormal   Collection Time: 08/26/21 11:09 AM  Result Value Ref Range   WBC 6.3 4.0 - 10.5 K/uL   RBC 3.03 (L) 4.22 - 5.81 MIL/uL   Hemoglobin 9.8 (L) 13.0 - 17.0 g/dL   HCT 31.5 (L) 39.0 - 52.0 %   MCV 104.0 (H) 80.0 - 100.0 fL   MCH 32.3 26.0 - 34.0 pg   MCHC 31.1 30.0 - 36.0 g/dL   RDW 13.7 11.5 - 15.5 %   Platelets 234 150 - 400 K/uL   nRBC 0.0 0.0 - 0.2 %    Comment: Performed at [facility], [place] [address] [place], [place] [address]  Glucose, capillary     Status: Abnormal   Collection Time: 08/26/21 12:05 PM  Result Value Ref Range   Glucose-Capillary 135 (H) 70 - 99 mg/dL    Comment: Glucose reference range applies only to samples taken after fasting for at least 8 hours.  Urinalysis, Routine w reflex microscopic Urine, Catheterized     Status: Abnormal   Collection Time: 08/26/21 12:22 PM  Result Value Ref Range   Color, Urine RED (A) YELLOW    Comment:  BIOCHEMICALS MAY BE AFFECTED BY COLOR   APPearance TURBID (A) CLEAR   Specific Gravity, Urine  1.005 - 1.030    TEST NOT REPORTED DUE TO COLOR INTERFERENCE OF URINE PIGMENT   pH  5.0 - 8.0    TEST NOT REPORTED DUE TO COLOR INTERFERENCE OF URINE PIGMENT   Glucose, UA (A) NEGATIVE mg/dL    TEST NOT REPORTED DUE TO COLOR INTERFERENCE OF URINE PIGMENT   Hgb urine dipstick (A) NEGATIVE    TEST NOT REPORTED DUE TO COLOR INTERFERENCE OF URINE PIGMENT   Bilirubin Urine (A) NEGATIVE    TEST NOT REPORTED DUE TO COLOR INTERFERENCE OF URINE PIGMENT   Ketones, ur (A) NEGATIVE mg/dL    TEST NOT REPORTED DUE TO COLOR INTERFERENCE OF URINE PIGMENT   Protein, ur (A) NEGATIVE mg/dL    TEST NOT REPORTED DUE TO COLOR INTERFERENCE OF URINE PIGMENT   Nitrite (A) NEGATIVE    TEST NOT REPORTED DUE TO COLOR INTERFERENCE OF URINE PIGMENT   Leukocytes,Ua (A) NEGATIVE    TEST NOT REPORTED DUE TO COLOR INTERFERENCE OF URINE PIGMENT    Comment: Performed at [facility], [place] [address] [place], [place] [address]  Urinalysis, Microscopic (reflex)     Status: Abnormal   Collection Time: 08/26/21 12:22 PM  Result Value Ref Range   RBC / HPF >50 0 - 5 RBC/hpf   WBC, UA 11-20 0 - 5 WBC/hpf   Bacteria, UA FEW (A) NONE SEEN   Squamous Epithelial / LPF 0-5 0 - 5   Ca Oxalate Crys, UA PRESENT     Comment: Performed at [facility] [address], [place], [place] [address]  Glucose, capillary     Status: None   Collection Time: 08/26/21  3:30 PM  Result Value Ref Range   Glucose-Capillary 97 70 - 99 mg/dL    Comment: Glucose reference  range applies only to samples taken after fasting for at least 8 hours.  Glucose, capillary     Status: Abnormal   Collection Time: 08/26/21  7:21 PM  Result Value Ref Range   Glucose-Capillary 109 (H) 70 - 99 mg/dL    Comment: Glucose reference range applies only to samples taken after fasting for at least 8 hours.  Glucose, capillary     Status: Abnormal   Collection  Time: 08/26/21 11:11 PM  Result Value Ref Range   Glucose-Capillary 114 (H) 70 - 99 mg/dL    Comment: Glucose reference range applies only to samples taken after fasting for at least 8 hours.  Glucose, capillary     Status: Abnormal   Collection Time: 08/27/21  3:16 AM  Result Value Ref Range   Glucose-Capillary 115 (H) 70 - 99 mg/dL    Comment: Glucose reference range applies only to samples taken after fasting for at least 8 hours.  CBC     Status: Abnormal   Collection Time: 08/27/21  3:39 AM  Result Value Ref Range   WBC 5.2 4.0 - 10.5 K/uL   RBC 2.72 (L) 4.22 - 5.81 MIL/uL   Hemoglobin 9.0 (L) 13.0 - 17.0 g/dL   HCT 27.4 (L) 39.0 - 52.0 %   MCV 100.7 (H) 80.0 - 100.0 fL   MCH 33.1 26.0 - 34.0 pg   MCHC 32.8 30.0 - 36.0 g/dL   RDW 13.8 11.5 - 15.5 %   Platelets 204 150 - 400 K/uL   nRBC 0.0 0.0 - 0.2 %    Comment: Performed at [facility], [place] [address] [place], [place] [address]  Basic metabolic panel     Status: Abnormal   Collection Time: 08/27/21  3:39 AM  Result Value Ref Range   Sodium 134 (L) 135 - 145 mmol/L   Potassium 4.4 3.5 - 5.1 mmol/L   Chloride 99 98 - 111 mmol/L   CO2 26 22 - 32 mmol/L   Glucose, Bld 115 (H) 70 - 99 mg/dL    Comment: Glucose reference range applies only to samples taken after fasting for at least 8 hours.   BUN 17 8 - 23 mg/dL   Creatinine, Ser 0.72 0.61 - 1.24 mg/dL   Calcium 8.7 (L) 8.9 - 10.3 mg/dL   GFR, Estimated >60 >60 mL/min    Comment: (NOTE) Calculated using the CKD-EPI Creatinine Equation (2021)    Anion gap 9 5 - 15    Comment: Performed at [facility] [address] [place], [place] [address]  Glucose, capillary     Status: Abnormal   Collection Time: 08/27/21  7:44 AM  Result Value Ref Range   Glucose-Capillary 107 (H) 70 - 99 mg/dL    Comment: Glucose reference range applies only to samples taken after fasting for at least 8 hours.  Glucose, capillary     Status: Abnormal   Collection Time: 08/27/21  11:19 AM  Result Value Ref Range   Glucose-Capillary 139 (H) 70 - 99 mg/dL    Comment: Glucose reference range applies only to samples taken after fasting for at least 8 hours.  Glucose, capillary     Status: Abnormal   Collection Time: 08/27/21  3:35 PM  Result Value Ref Range   Glucose-Capillary 118 (H) 70 - 99 mg/dL    Comment: Glucose reference range applies only to samples taken after fasting for at least 8 hours.    Current Facility-Administered Medications  Medication Dose  Route Frequency Provider Last Rate Last Admin  . 0.9 %  sodium chloride infusion  250 mL Intravenous Continuous Norm Parcel, PA-C 20 mL/hr at 08/11/21 0535 250 mL at 08/11/21 0535  . acetaminophen (TYLENOL) tablet 1,000 mg  1,000 mg Per Tube Q6H Barkley Boards R, PA-C   1,000 mg at 08/27/21 1720  . aspirin chewable tablet 81 mg  81 mg Per Tube Daily Georganna Skeans, MD   81 mg at 08/27/21 0955  . bethanechol (URECHOLINE) tablet 25 mg  25 mg Per Tube TID Norm Parcel, PA-C   25 mg at 08/27/21 1508  . chlorhexidine gluconate (MEDLINE KIT) (PERIDEX) 0.12 % solution 15 mL  15 mL Mouth Rinse BID Norm Parcel, PA-C   15 mL at 08/27/21 7530  . Chlorhexidine Gluconate Cloth 2 % PADS 6 each  6 each Topical Daily Norm Parcel, PA-C   6 each at 08/27/21 1509  . cholecalciferol (VITAMIN D3) tablet 2,000 Units  2,000 Units Per Tube Daily Norm Parcel, PA-C   2,000 Units at 08/27/21 0955  . enoxaparin (LOVENOX) injection 40 mg  40 mg Subcutaneous Q12H Norm Parcel, PA-C   40 mg at 08/27/21 0950  . feeding supplement (JEVITY 1.5 CAL/FIBER) liquid 1,000 mL  1,000 mL Per Tube Continuous Norm Parcel, PA-C 65 mL/hr at 08/27/21 1306 1,000 mL at 08/27/21 1306  . feeding supplement (PROSource TF) liquid 90 mL  90 mL Per Tube BID Norm Parcel, PA-C   90 mL at 08/27/21 0950  . folic acid (FOLVITE) tablet 1 mg  1 mg Per Tube Daily Norm Parcel, PA-C   1 mg at 08/27/21 0955  . free water 250 mL  250 mL  Per Tube Q6H Barkley Boards R, PA-C   250 mL at 08/27/21 1720  . gabapentin (NEURONTIN) 250 MG/5ML solution 300 mg  300 mg Per Tube Q8H Kinsinger, Arta Bruce, MD   300 mg at 08/27/21 1314  . glycopyrrolate (ROBINUL) tablet 2 mg  2 mg Per Tube TID Dwan Bolt, MD   2 mg at 08/27/21 1508  . guaiFENesin (ROBITUSSIN) 100 MG/5ML liquid 10 mL  10 mL Per Tube Q4H Barkley Boards R, PA-C   10 mL at 08/27/21 1507  . haloperidol (HALDOL) tablet 5 mg  5 mg Oral Q6H PRN [name], MD   5 mg at 08/16/21 0511   Or  . haloperidol lactate (HALDOL) injection 5 mg  5 mg Intramuscular Q6H PRN [name], MD       Or  . haloperidol lactate (HALDOL) injection 5 mg  5 mg Intravenous Q6H PRN [name], MD   5 mg at 08/25/21 1351  . hydrocortisone cream 1 %   Topical TID Georganna Skeans, MD   Given at 08/27/21 1508  . HYDROmorphone (DILAUDID) injection 1-2 mg  1-2 mg Intravenous Q1H PRN Norm Parcel, PA-C   1 mg at 08/24/21 1744  . hydrOXYzine (ATARAX) tablet 25 mg  25 mg Per Tube QID PRN Cerys Winget A   25 mg at 08/27/21 1303  . hydrOXYzine (ATARAX) tablet 25 mg  25 mg Per Tube QID [name], MD   25 mg at 08/27/21 1720  . ibuprofen (ADVIL) tablet 600 mg  600 mg Per Tube Q6H PRN Kinsinger, Arta Bruce, MD   600 mg at 08/18/21 1006  . insulin aspart (novoLOG) injection 0-15 Units  0-15 Units Subcutaneous Q4H Norm Parcel, [place]  2 Units at 08/27/21 1216  . insulin glargine-yfgn (SEMGLEE) injection 5 Units  5 Units Subcutaneous Daily Norm Parcel, PA-C   5 Units at 08/27/21 1017  . ipratropium-albuterol (DUONEB) 0.5-2.5 (3) MG/3ML nebulizer solution 3 mL  3 mL Nebulization Q6H PRN Barkley Boards R, PA-C   3 mL at 08/02/21 0319  . lidocaine (LIDODERM) 5 % 1 patch  1 patch Transdermal Q24H Norm Parcel, PA-C   1 patch at 08/27/21 1020  . lipase/protease/amylase) (VIOKACE) tablets 20,880 Units  20,880 Units Per Tube Once Lovick, Montel Culver, MD       And  . sodium  bicarbonate tablet 650 mg  650 mg Per Tube Once [name], MD      . MEDLINE mouth rinse  15 mL Mouth Rinse 10 times per day Norm Parcel, PA-C   15 mL at 08/27/21 1720  . melatonin tablet 3 mg  3 mg Per Tube q1800 Carmella Kees A   3 mg at 08/27/21 1724  . methocarbamol (ROBAXIN) tablet 1,000 mg  1,000 mg Per Tube Q8H Barkley Boards R, PA-C   1,000 mg at 08/27/21 1313  . multivitamin with minerals tablet 1 tablet  1 tablet Per Tube Daily Norm Parcel, PA-C   1 tablet at 08/27/21 0955  . ondansetron (ZOFRAN-ODT) disintegrating tablet 4 mg  4 mg Oral Q6H PRN Norm Parcel, PA-C       Or  . ondansetron [facility]) injection 4 mg  4 mg Intravenous Q6H PRN Norm Parcel, PA-C   4 mg at 07/27/21 2048  . oxyCODONE (ROXICODONE) 5 MG/5ML solution 5-10 mg  5-10 mg Per Tube Q3H PRN Barkley Boards R, PA-C   10 mg at 08/27/21 1452  . pantoprazole sodium (PROTONIX) 40 mg/20 mL oral suspension 40 mg  40 mg Per Tube Daily Norm Parcel, PA-C   40 mg at 08/27/21 0950  . polyethylene glycol (MIRALAX / GLYCOLAX) packet 17 g  17 g Per Tube Daily PRN Stechschulte, Nickola Major, MD   17 g at 08/27/21 1219  . pravastatin (PRAVACHOL) tablet 40 mg  40 mg Per Tube Daily Stechschulte, Nickola Major, MD   40 mg at 08/27/21 0955  . QUEtiapine (SEROQUEL) tablet 100 mg  100 mg Per Tube q morning Brewington, Eden E, RPH   100 mg at 08/27/21 1015  . QUEtiapine (SEROQUEL) tablet 200 mg  200 mg Per Tube QHS Brewington, Eden E, RPH   200 mg at 08/26/21 2110  . silodosin (RAPAFLO) capsule 8 mg  8 mg Per Tube Q breakfast Norm Parcel, PA-C   8 mg at 08/27/21 0754  . thiamine tablet 100 mg  100 mg Per Tube Daily Norm Parcel, PA-C   100 mg at 08/27/21 3893   Or  . thiamine (B-1) injection 100 mg  100 mg Intravenous Daily Norm Parcel, PA-C   100 mg at 08/10/21 7342  . traZODone (DESYREL) tablet 25 mg  25 mg Oral QHS Saori Umholtz A        Psychiatric Specialty Exam:  Presentation  General  Appearance: Appropriate for Environment  Eye Contact:Good  Speech:-- (stacatto prosody)  Speech Volume:Decreased  Handedness:No data recorded  Mood and Affect  Mood:Anxious  Affect:Congruent   Thought Process  Thought Processes:Coherent; Goal Directed  Descriptions of Associations:Intact  Orientation:Full (Time, Place and Person)  Thought Content:-- (devoid of SI, HI)  History of Schizophrenia/Schizoaffective disorder:No data recorded Duration of Psychotic Symptoms:No data recorded Hallucinations:Hallucinations: None  Ideas of Reference:None  Suicidal Thoughts:Suicidal Thoughts: No  Homicidal Thoughts:Homicidal Thoughts: No   Sensorium  Memory:Immediate Fair; Recent Fair; Remote Fair  Judgment:Fair  Insight:Fair   Executive Functions  Concentration:Fair  Attention Span:Fair  [place]   Psychomotor Activity  Psychomotor Activity:Psychomotor Activity: Normal   Assets  Assets:Resilience; Social Support   Sleep  Sleep:Sleep: Poor   Physical Exam: Physical Exam HENT:     Head: Normocephalic and atraumatic.  Neurological:     Mental Status: He is alert.     Comments: Oriented to person, place, situation time   ROS Blood pressure 119/83, pulse (!) 118, temperature 99.5 F (37.5 C), temperature source Oral, resp. rate (!) 21, height '6\' 2"'  (1.88 m), weight 107.4 kg, SpO2 99 %. Body mass index is 30.4 kg/m.  Kerrie Buffalo Sally Reimers 08/27/2021 5:33 PM

## 2021-08-27 NOTE — Evaluation (Signed)
Clinical/Bedside Swallow Evaluation Patient Details  Name: NUEL DEJAYNES III MRN: [id number] Date of Birth: [birth year]  Today's Date: 08/27/2021 Time: SLP Start Time (ACUTE ONLY): 1010 SLP Stop Time (ACUTE ONLY): 1040 SLP Time Calculation (min) (ACUTE ONLY): 30 min  Past Medical History:  Past Medical History:  Diagnosis Date   COPD (chronic obstructive pulmonary disease) ([facility])    GERD (gastroesophageal reflux disease)    History of hepatitis C    treated several years ago through [facility]   Hypertension    Past Surgical History:  Past Surgical History:  Procedure Laterality Date   CATARACT EXTRACTION, BILATERAL     EYE SURGERY Bilateral    ORIF PELVIC FRACTURE WITH PERCUTANEOUS SCREWS Right 07/29/2021   Procedure: SI SCREW FIXATION OF RIGHT PELVIC RING;  Surgeon: Myrene Galas, MD;  Location: [facility];  Service: Orthopedics;  Laterality: Right;   ORTHOPEDIC SURGERY Bilateral    pt states fracture repairs to arms and legs.   REVERSE SHOULDER ARTHROPLASTY Left 09/23/2019   Procedure: REVERSE SHOULDER ARTHROPLASTY;  Surgeon: Jones Broom, MD;  Location: [facility];  Service: Orthopedics;  Laterality: Left;   TRACHEOSTOMY TUBE PLACEMENT N/A 08/10/2021   Procedure: TRACHEOSTOMY;  Surgeon: Violeta Gelinas, MD;  Location: [facility] OR;  Service: General;  Laterality: N/A;   HPI:  65 y/o male presented to ED on 11/8 after fall off 8 foot ladder. CT chest found small R apical pneumothorax, multiple displaced posterior R rib fxs, nondisplaced R iliac wing fx with widening of R SI joint, displaced fx both inferior pubic rami and L superior pubic ramus, and nondisplaced L sacral fx, SI screw 07/29/21. intubated 11/9, trach 11/22. Trach collar 11/30 PMH: COPD, EtOH abuse, HTN, GERD, hep C    Assessment / Plan / Recommendation  Clinical Impression  Mr. Caplin was able to participate in initial swallow assessment now that he was on TC.  PMV in place.  Oral mucosa in excellent condition.  Pt had been asking for  water this am. Given current respiratory condition, length of time intubated and on vent, assessment was conservative with only ice chips provided.  Pt demonstrated functional mastication, observable swallow response, and consistent coughing after most ice chips swallows.  He was encouraged to cough and expectorate orally when possible.  If weaning from vent continues to go well, he should be ready for FEES at the time of our next session.  Recommend allowing occasional ice chips when off the vent and PMV is in place (3-4 ice chips every two hours).  SLP will continue to follow.  D/W pt, RN, and daughter. SLP Visit Diagnosis: Dysphagia, pharyngeal phase (R13.13)    Aspiration Risk    tbd   Diet Recommendation   May have 3-4 ice chips after oral care, when on TC, every 2 hours.  Medication Administration: Via alternative means    Other  Recommendations Oral Care Recommendations: Oral care QID Other Recommendations: Have oral suction available    Recommendations for follow up therapy are one component of a multi-disciplinary discharge planning process, led by the attending physician.  Recommendations may be updated based on patient status, additional functional criteria and insurance authorization.  Follow up Recommendations Acute inpatient rehab (3hours/day)      Assistance Recommended at Discharge Frequent or constant Supervision/Assistance  Functional Status Assessment Patient has had a recent decline in their functional status and demonstrates the ability to make significant improvements in function in a reasonable and predictable amount of time.  Frequency and Duration min 2x/week  2 weeks       Prognosis        Swallow Study   General Date of Onset: 07/27/21 HPI: 65 y/o male presented to ED on 11/8 after fall off 8 foot ladder. CT chest found small R apical pneumothorax, multiple displaced posterior R rib fxs, nondisplaced R iliac wing fx with widening of R SI joint, displaced fx  both inferior pubic rami and L superior pubic ramus, and nondisplaced L sacral fx, SI screw 07/29/21. intubated 11/9, trach 11/22. Trach collar 11/30 PMH: COPD, EtOH abuse, HTN, GERD, hep C Type of Study: Bedside Swallow Evaluation Diet Prior to this Study: NPO;NG Tube Temperature Spikes Noted: No Respiratory Status: Trach;Trach Collar Trach Size and Type: Cuff;Extra long;#6;With PMSV in place History of Recent Intubation: Yes Length of Intubations (days):  (see HPI) Behavior/Cognition: Alert;Cooperative;Distractible Oral Cavity Assessment: Within Functional Limits Oral Care Completed by SLP: Recent completion by staff Oral Cavity - Dentition: Adequate natural dentition Vision: Functional for self-feeding Self-Feeding Abilities: Needs assist Patient Positioning: Upright in bed;Upright in chair Baseline Vocal Quality: Hoarse Volitional Cough: Strong Volitional Swallow: Able to elicit    Oral/Motor/Sensory Function Overall Oral Motor/Sensory Function: Within functional limits   Ice Chips Ice chips: Impaired Presentation: Spoon Pharyngeal Phase Impairments: Cough - Immediate   Thin Liquid Thin Liquid: Not tested    Nectar Thick Nectar Thick Liquid: Not tested   Honey Thick Honey Thick Liquid: Not tested   Puree Puree: Not tested   Solid     Solid: Not tested      [name] 08/27/2021,11:13 AM  Marchelle Folks L. Samson Frederic, MA CCC/SLP Acute Rehabilitation Services Office number [phone] Pager [phone]

## 2021-08-27 NOTE — Progress Notes (Addendum)
Patient ID: [name], male   DOB: [birth year], 65 y.o.   MRN: [id number] Spoke with Dr. Berneice Heinrich of urology regarding hematuria. He reviewed the chart and does not recommend inpatient consult. This is secondary to foley trauma. Follow up with urology outpatient for cysto. Can start finasteride 5mg  qd - this cannot be crushed and go down [place] so will wait on starting this until he is off the vent and tolerating PO.  , [facility] Surgery 08/27/2021, 9:42 AM Please see Amion for pager number during day hours 7:00am-4:30pm

## 2021-08-27 NOTE — Progress Notes (Signed)
Orthopaedic Trauma Service Progress Note  Patient ID: [name] MRN: [id number] DOB/AGE: 06/03/1956 65 y.o.  Subjective:  Very anxious Asking about anxiety meds  No complaints with regards to pelvis  Xrays from earlier this week of his pelvis look great. Hardware is stable and good alignment    ROS As above  Objective:   VITALS:   Vitals:   08/27/21 0700 08/27/21 0800 08/27/21 0825 08/27/21 0900  BP: 111/69 122/78  134/78  Pulse: 93 (!) 110 88 92  Resp: (!) 23 17 (!) 24 (!) 26  Temp:  100.1 F (37.8 C)    TempSrc:  Oral    SpO2: 93% 99% 92% 91%  Weight:      Height:        Estimated body mass index is 30.4 kg/m as calculated from the following:   Height as of this encounter: 6\' 2"  (1.88 m).   Weight as of this encounter: 107.4 kg.   Intake/Output      12/08 0701 12/09 0700 12/09 0701 12/10 0700   NG/GT 1930 825   IV Piggyback 1496.9    Total Intake(mL/kg) 3426.9 (31.9) 825 (7.7)   Urine (mL/kg/hr) 5320 (2.1) 300 (0.9)   Total Output 5320 300   Net -1893.2 +525          LABS  Results for orders placed or performed during the hospital encounter of 07/27/21 (from the past 24 hour(s))  CBC     Status: Abnormal   Collection Time: 08/26/21 11:09 AM  Result Value Ref Range   WBC 6.3 4.0 - 10.5 K/uL   RBC 3.03 (L) 4.22 - 5.81 MIL/uL   Hemoglobin 9.8 (L) 13.0 - 17.0 g/dL   HCT 14/08/22 (L) 56.3 - 87.5 %   MCV 104.0 (H) 80.0 - 100.0 fL   MCH 32.3 26.0 - 34.0 pg   MCHC 31.1 30.0 - 36.0 g/dL   RDW 64.3 32.9 - 51.8 %   Platelets 234 150 - 400 K/uL   nRBC 0.0 0.0 - 0.2 %  Glucose, capillary     Status: Abnormal   Collection Time: 08/26/21 12:05 PM  Result Value Ref Range   Glucose-Capillary 135 (H) 70 - 99 mg/dL  Urinalysis, Routine w reflex microscopic Urine, Catheterized     Status: Abnormal   Collection Time: 08/26/21 12:22 PM  Result Value Ref Range   Color, Urine RED (A)  YELLOW   APPearance TURBID (A) CLEAR   Specific Gravity, Urine  1.005 - 1.030    TEST NOT REPORTED DUE TO COLOR INTERFERENCE OF URINE PIGMENT   pH  5.0 - 8.0    TEST NOT REPORTED DUE TO COLOR INTERFERENCE OF URINE PIGMENT   Glucose, UA (A) NEGATIVE mg/dL    TEST NOT REPORTED DUE TO COLOR INTERFERENCE OF URINE PIGMENT   Hgb urine dipstick (A) NEGATIVE    TEST NOT REPORTED DUE TO COLOR INTERFERENCE OF URINE PIGMENT   Bilirubin Urine (A) NEGATIVE    TEST NOT REPORTED DUE TO COLOR INTERFERENCE OF URINE PIGMENT   Ketones, ur (A) NEGATIVE mg/dL    TEST NOT REPORTED DUE TO COLOR INTERFERENCE OF URINE PIGMENT   Protein, ur (A) NEGATIVE mg/dL    TEST NOT REPORTED DUE TO COLOR INTERFERENCE OF URINE PIGMENT   Nitrite (A) NEGATIVE  TEST NOT REPORTED DUE TO COLOR INTERFERENCE OF URINE PIGMENT   Leukocytes,Ua (A) NEGATIVE    TEST NOT REPORTED DUE TO COLOR INTERFERENCE OF URINE PIGMENT  Urinalysis, Microscopic (reflex)     Status: Abnormal   Collection Time: 08/26/21 12:22 PM  Result Value Ref Range   RBC / HPF >50 0 - 5 RBC/hpf   WBC, UA 11-20 0 - 5 WBC/hpf   Bacteria, UA FEW (A) NONE SEEN   Squamous Epithelial / LPF 0-5 0 - 5   Ca Oxalate Crys, UA PRESENT   Glucose, capillary     Status: None   Collection Time: 08/26/21  3:30 PM  Result Value Ref Range   Glucose-Capillary 97 70 - 99 mg/dL  Glucose, capillary     Status: Abnormal   Collection Time: 08/26/21  7:21 PM  Result Value Ref Range   Glucose-Capillary 109 (H) 70 - 99 mg/dL  Glucose, capillary     Status: Abnormal   Collection Time: 08/26/21 11:11 PM  Result Value Ref Range   Glucose-Capillary 114 (H) 70 - 99 mg/dL  Glucose, capillary     Status: Abnormal   Collection Time: 08/27/21  3:16 AM  Result Value Ref Range   Glucose-Capillary 115 (H) 70 - 99 mg/dL  CBC     Status: Abnormal   Collection Time: 08/27/21  3:39 AM  Result Value Ref Range   WBC 5.2 4.0 - 10.5 K/uL   RBC 2.72 (L) 4.22 - 5.81 MIL/uL   Hemoglobin 9.0 (L)  13.0 - 17.0 g/dL   HCT 49.1 (L) 79.1 - 50.5 %   MCV 100.7 (H) 80.0 - 100.0 fL   MCH 33.1 26.0 - 34.0 pg   MCHC 32.8 30.0 - 36.0 g/dL   RDW 69.7 94.8 - 01.6 %   Platelets 204 150 - 400 K/uL   nRBC 0.0 0.0 - 0.2 %  Basic metabolic panel     Status: Abnormal   Collection Time: 08/27/21  3:39 AM  Result Value Ref Range   Sodium 134 (L) 135 - 145 mmol/L   Potassium 4.4 3.5 - 5.1 mmol/L   Chloride 99 98 - 111 mmol/L   CO2 26 22 - 32 mmol/L   Glucose, Bld 115 (H) 70 - 99 mg/dL   BUN 17 8 - 23 mg/dL   Creatinine, Ser 5.53 0.61 - 1.24 mg/dL   Calcium 8.7 (L) 8.9 - 10.3 mg/dL   GFR, Estimated >74 >82 mL/min   Anion gap 9 5 - 15  Glucose, capillary     Status: Abnormal   Collection Time: 08/27/21  7:44 AM  Result Value Ref Range   Glucose-Capillary 107 (H) 70 - 99 mg/dL     PHYSICAL EXAM:   Gen: sitting up in bed, daughter at bedside, alert Pelvis: poke incision R flank healed  Distal motor and sensory function intact  Extremities warm  + Peripheral pulses  Assessment/Plan:   Anti-infectives (From admission, onward)    Start     Dose/Rate Route Frequency Ordered Stop   08/27/21 1000  fosfomycin (MONUROL) packet 3 g  Status:  Discontinued        3 g Per Tube Daily 08/27/21 0843 08/27/21 0920   08/23/21 1630  sulfamethoxazole-trimethoprim (BACTRIM) 319.04 mg in dextrose 5 % 500 mL IVPB        10 mg/kg/day  95.7 kg (Adjusted) 346.6 mL/hr over 90 Minutes Intravenous Every 8 hours 08/23/21 1531 08/26/21 2258   08/20/21 1215  levofloxacin (LEVAQUIN) IVPB 750  mg  Status:  Discontinued        750 mg 100 mL/hr over 90 Minutes Intravenous Daily 08/20/21 1126 08/23/21 1531   08/06/21 0915  cefTRIAXone (ROCEPHIN) 2 g in sodium chloride 0.9 % 100 mL IVPB        2 g 200 mL/hr over 30 Minutes Intravenous Every 24 hours 08/06/21 0818 08/12/21 1103   07/29/21 1800  ceFAZolin (ANCEF) IVPB 2g/100 mL premix        2 g 200 mL/hr over 30 Minutes Intravenous Every 8 hours 07/29/21 1241 07/30/21  1112   07/28/21 0815  ceFAZolin (ANCEF) IVPB 2g/100 mL premix  Status:  Discontinued        2 g 200 mL/hr over 30 Minutes Intravenous On call to O.R. 07/27/21 1744 07/28/21 0900     .  4 weeks postop percutaneous fixation pelvic ring fracture  65 y/o male s/p fall with complex, unstable pelvic ring injury    - fall   - unstable pelvic ring fracture s/p Trans-sacral screw fixation (s1 and s2, R to L)             Weight-bear as tolerated right leg for transfers             WBAT L leg             Unrestricted range of motion bilateral lower extremities    - Medical issues              Per TS   - DVT/PE prophylaxis:             Lovenox   - ID:              Periop abx completed   - Metabolic Bone Disease:             + vitamin d insufficiency    - Activity:             As above    - Impediments to fracture healing:             Vitamin d insufficiency              Marijuana use    - Dispo:             Ortho issue stable                 Mearl Latin, PA-C [phone] (C) 08/27/2021, 10:01 AM  [facility] [address] [place] [address] [phone] [place]) [phone] (F)    After 5pm and on the weekends please log on to Amion, go to orthopaedics and the look under the [facility] Call for the provider(s) on call. You can also call our office at [phone] and then follow the prompts to be connected to the call team.   Patient ID: [name], male   DOB: 26-Sep-1955, 65 y.o.   MRN: [id number]

## 2021-08-28 LAB — GLUCOSE, CAPILLARY
Glucose-Capillary: 134 mg/dL — ABNORMAL HIGH (ref 70–99)
Glucose-Capillary: 127 mg/dL — ABNORMAL HIGH (ref 70–99)
Glucose-Capillary: 162 mg/dL — ABNORMAL HIGH (ref 70–99)
Glucose-Capillary: 130 mg/dL — ABNORMAL HIGH (ref 70–99)
Glucose-Capillary: 121 mg/dL — ABNORMAL HIGH (ref 70–99)

## 2021-08-28 MED ORDER — TRAZODONE HCL 50 MG PO TABS
25.0000 mg | ORAL_TABLET | Freq: Every day | ORAL | Status: DC
  Administered 2021-08-28 – 2021-08-29 (×2): 25 mg
  Filled 2021-08-28: qty 1

## 2021-08-28 NOTE — Progress Notes (Signed)
Patient ID: [name], male   DOB: [birth year], 65 y.o.   MRN: [id number] Follow up - Trauma Critical Care  Patient Details:    [name] Brewer is an 65 y.o. male.  Lines/tubes : Urethral Catheter [name], RN 16 Fr. (Active)  Indication for Insertion or Continuance of Catheter Acute urinary retention (I&O Cath for 24 hrs prior to catheter insertion- Inpatient Only) 08/28/21 0800  Site Assessment Clean;Intact;Dry 08/28/21 0800  Catheter Maintenance Bag below level of bladder;Catheter secured;Drainage bag/tubing not touching floor;Insertion date on drainage bag;No dependent loops;Seal intact 08/28/21 0800  Collection Container Standard drainage bag 08/28/21 0800  Securement Method Securing device (Describe) 08/28/21 0800  Urinary Catheter Interventions (if applicable) Unclamped 08/28/21 0800  Output (mL) 1150 mL 08/28/21 0900    Microbiology/Sepsis markers: Results for orders placed or performed during the hospital encounter of 07/27/21  Resp Panel by RT-PCR (Flu A&B, Covid) Nasopharyngeal Swab     Status: None   Collection Time: 07/27/21  3:34 PM   Specimen: Nasopharyngeal Swab; Nasopharyngeal(NP) swabs in vial transport medium  Result Value Ref Range Status   SARS Coronavirus 2 by RT PCR NEGATIVE NEGATIVE Final    Comment: (NOTE) SARS-CoV-2 target nucleic acids are NOT DETECTED.  The SARS-CoV-2 RNA is generally detectable in upper respiratory specimens during the acute phase of infection. The lowest concentration of SARS-CoV-2 viral copies this assay can detect is 138 copies/mL. A negative result does not preclude SARS-Cov-2 infection and should not be used as the sole basis for treatment or other patient management decisions. A negative result may occur with  improper specimen collection/handling, submission of specimen other than nasopharyngeal swab, presence of viral mutation(s) within the areas targeted by this assay, and inadequate number of viral copies(<138  copies/mL). A negative result must be combined with clinical observations, patient history, and epidemiological information. The expected result is Negative.  Fact Sheet for Patients:  BloggerCourse.com  Fact Sheet for Healthcare Providers:  SeriousBroker.it  This test is no t yet approved or cleared by the [place] FDA and  has been authorized for detection and/or diagnosis of SARS-CoV-2 by FDA under an Emergency Use Authorization (EUA). This EUA will remain  in effect (meaning this test can be used) for the duration of the COVID-19 declaration under Section 564(b)(1) of the Act, 21 U.S.C.section 360bbb-3(b)(1), unless the authorization is terminated  or revoked sooner.       Influenza A by PCR NEGATIVE NEGATIVE Final   Influenza B by PCR NEGATIVE NEGATIVE Final    Comment: (NOTE) The Xpert Xpress SARS-CoV-2/FLU/RSV plus assay is intended as an aid in the diagnosis of influenza from Nasopharyngeal swab specimens and should not be used as a sole basis for treatment. Nasal washings and aspirates are unacceptable for Xpert Xpress SARS-CoV-2/FLU/RSV testing.  Fact Sheet for Patients: BloggerCourse.com  Fact Sheet for Healthcare Providers: SeriousBroker.it  This test is not yet approved or cleared by the [place] FDA and has been authorized for detection and/or diagnosis of SARS-CoV-2 by FDA under an Emergency Use Authorization (EUA). This EUA will remain in effect (meaning this test can be used) for the duration of the COVID-19 declaration under Section 564(b)(1) of the Act, 21 U.S.C. section 360bbb-3(b)(1), unless the authorization is terminated or revoked.  Performed at [facility], [address] [place], [place] [address]   Surgical PCR screen     Status: None   Collection Time: 07/27/21  5:54 PM   Specimen: Nasal Mucosa; Nasal  Swab  Result Value Ref  Range Status   MRSA, PCR NEGATIVE NEGATIVE Final   Staphylococcus aureus NEGATIVE NEGATIVE Final    Comment: (NOTE) The Xpert SA Assay (FDA approved for NASAL specimens in patients 65 years of age and older), is one component of a comprehensive surveillance program. It is not intended to diagnose infection nor to guide or monitor treatment. Performed at [facility], [address] [place], [place] [address]   Culture, Respiratory w Gram Stain     Status: None   Collection Time: 08/04/21 11:53 AM   Specimen: Tracheal Aspirate; Respiratory  Result Value Ref Range Status   Specimen Description TRACHEAL ASPIRATE  Final   Special Requests NONE  Final   Gram Stain   Final    FEW SQUAMOUS EPITHELIAL CELLS PRESENT FEW WBC SEEN FEW GRAM POSITIVE COCCI MODERATE GRAM NEGATIVE RODS    Culture   Final    ABUNDANT HAEMOPHILUS INFLUENZAE BETA LACTAMASE NEGATIVE Performed at [facility], [address] [place], [place] [address]    Report Status 08/05/2021 FINAL  Final  Culture, Respiratory w Gram Stain     Status: None   Collection Time: 08/18/21  8:22 AM   Specimen: Tracheal Aspirate; Respiratory  Result Value Ref Range Status   Specimen Description TRACHEAL ASPIRATE  Final   Special Requests NONE  Final   Gram Stain   Final    NO WBC SEEN FEW GRAM VARIABLE ROD RARE GRAM POSITIVE COCCI IN PAIRS Performed at [facility], [address] [place], [place] [address]    Culture   Final    MODERATE SERRATIA MARCESCENS MODERATE STENOTROPHOMONAS MALTOPHILIA    Report Status 08/21/2021 FINAL  Final   Organism ID, Bacteria SERRATIA MARCESCENS  Final   Organism ID, Bacteria STENOTROPHOMONAS MALTOPHILIA  Final      Susceptibility   Stenotrophomonas maltophilia - MIC*    LEVOFLOXACIN 0.5 SENSITIVE Sensitive     TRIMETH/SULFA <=20 SENSITIVE Sensitive     * MODERATE STENOTROPHOMONAS MALTOPHILIA   Serratia marcescens - MIC*    CEFAZOLIN >=64 RESISTANT Resistant      CEFEPIME <=0.12 SENSITIVE Sensitive     CEFTAZIDIME <=1 SENSITIVE Sensitive     CEFTRIAXONE <=0.25 SENSITIVE Sensitive     CIPROFLOXACIN <=0.25 SENSITIVE Sensitive     GENTAMICIN <=1 SENSITIVE Sensitive     TRIMETH/SULFA <=20 SENSITIVE Sensitive     * MODERATE SERRATIA MARCESCENS    Anti-infectives:  Anti-infectives (From admission, onward)    Start     Dose/Rate Route Frequency Ordered Stop   08/27/21 1000  fosfomycin (MONUROL) packet 3 g  Status:  Discontinued        3 g Per Tube Daily 08/27/21 0843 08/27/21 0920   08/23/21 1630  sulfamethoxazole-trimethoprim (BACTRIM) 319.04 mg in dextrose 5 % 500 mL IVPB        10 mg/kg/day  95.7 kg (Adjusted) 346.6 mL/hr over 90 Minutes Intravenous Every 8 hours 08/23/21 1531 08/26/21 2258   08/20/21 1215  levofloxacin (LEVAQUIN) IVPB 750 mg  Status:  Discontinued        750 mg 100 mL/hr over 90 Minutes Intravenous Daily 08/20/21 1126 08/23/21 1531   08/06/21 0915  cefTRIAXone (ROCEPHIN) 2 g in sodium chloride 0.9 % 100 mL IVPB        2 g 200 mL/hr over 30 Minutes Intravenous Every 24 hours 08/06/21 0818 08/12/21 1103   07/29/21 1800  ceFAZolin (ANCEF) IVPB 2g/100 mL premix  2 g 200 mL/hr over 30 Minutes Intravenous Every 8 hours 07/29/21 1241 07/30/21 1112   07/28/21 0815  ceFAZolin (ANCEF) IVPB 2g/100 mL premix  Status:  Discontinued        2 g 200 mL/hr over 30 Minutes Intravenous On call to O.R. 07/27/21 1744 07/28/21 0900       Best Practice/Protocols:  VTE Prophylaxis: Lovenox (prophylaxtic dose) Intermittent Sedation  Consults: Treatment Team:  Altamese [place], MD    Studies:    Events:  Subjective:    Overnight Issues:   Objective:  Vital signs for last 24 hours: Temp:  [97.9 F (36.6 C)-99.5 F (37.5 C)] 97.9 F (36.6 C) (12/10 1200) Pulse Rate:  [88-229] 108 (12/10 1200) Resp:  [12-35] 20 (12/10 1200) BP: (97-162)/(64-101) 127/90 (12/10 1200) SpO2:  [73 %-99 %] 96 % (12/10 1200) FiO2 (%):  [40 %-60  %] 60 % (12/10 1147)  Hemodynamic parameters for last 24 hours:    Intake/Output from previous day: 12/09 0701 - 12/10 0700 In: 3310 [NG/GT:3310] Out: 4325 [Urine:4325]  Intake/Output this shift: Total I/O In: -  Out: 1150 [Urine:1150]  Vent settings for last 24 hours: Vent Mode: PRVC FiO2 (%):  [40 %-60 %] 60 % Set Rate:  [16 bmp] 16 bmp Vt Set:  [650 mL] 650 mL PEEP:  [5 cmH20] 5 cmH20 Plateau Pressure:  [18 cmH20-22 cmH20] 18 cmH20  Physical Exam:  General: alert Neuro: alert and does F/C, unsure what hospital he is in HEENT/Neck: trach-clean, intact Resp: rhonchi bilaterally CVS: regular rate and rhythm, S1, S2 normal, no murmur, click, rub or gallop GI: soft, nontender, BS WNL, no r/g Extremities: edema 1+  Results for orders placed or performed during the hospital encounter of 07/27/21 (from the past 24 hour(s))  Glucose, capillary     Status: Abnormal   Collection Time: 08/27/21  3:35 PM  Result Value Ref Range   Glucose-Capillary 118 (H) 70 - 99 mg/dL  Glucose, capillary     Status: Abnormal   Collection Time: 08/27/21  8:08 PM  Result Value Ref Range   Glucose-Capillary 125 (H) 70 - 99 mg/dL   Comment 1 Notify RN    Comment 2 Document in Chart   Glucose, capillary     Status: Abnormal   Collection Time: 08/27/21 11:52 PM  Result Value Ref Range   Glucose-Capillary 117 (H) 70 - 99 mg/dL   Comment 1 Notify RN    Comment 2 Document in Chart   Glucose, capillary     Status: Abnormal   Collection Time: 08/28/21  3:24 AM  Result Value Ref Range   Glucose-Capillary 134 (H) 70 - 99 mg/dL   Comment 1 Notify RN    Comment 2 Document in Chart   Glucose, capillary     Status: Abnormal   Collection Time: 08/28/21  7:50 AM  Result Value Ref Range   Glucose-Capillary 127 (H) 70 - 99 mg/dL  Glucose, capillary     Status: Abnormal   Collection Time: 08/28/21 12:02 PM  Result Value Ref Range   Glucose-Capillary 162 (H) 70 - 99 mg/dL    Assessment &  Plan: Present on Admission:  Fall    LOS: 32 days   Additional comments:I reviewed the patient's new clinical lab test results. . Fall off ladder   Acute hypoxic ventilator dependent respiratory failure - intubated 11/9, self-extubated and promptly re-intubated 11/12 AM. Extubated and reintubated 11/21. Trach 11/22, now back on vent, continue robinul and guaifenesin. HTC as able (  on it now)-  PMV Agitation/delirium - ongoing, appreciate Psychiatry assist  R PTX - resolved R rib fractures - aggressive multimodal pain control, IS, pulm toilet Pelvic fx (L sacrum, R iliac, b/l inf and L sup pubic rami) with pelvic hematoma - ortho c/s, Dr. Carola Frost, SI screw 11/10 H/o alcohol abuse  COPD - prn duonebs, guaifenesin, trach 11/22, see above Hyperglycemia - SSI, glargine 5u, glc well-controlled Anemia - stable, monitor HTN - home lisinopril held for low BP Hx of Hep C  FEN: NPO, TF DVT - SCDs, LMWH Foley - replaced for retention 11/17, 11/22, 11/29 Hematuria - U/A unrevealing, no S/S infection. Per Urology add finasteride but cannot give per tube ID - CTX x7d for H flu in 11/16 resp cx, end 11/24; repeat resp cx/CXR sent 11/30 with Serratia and Steno, changed to Bactrim to complete 7d Dispo - ICU, HTC as able, TOC working on [employer] placement I spoke with his daughter at the bedside. Critical Care Total Time*: 32 Minutes  Violeta Gelinas, MD, MPH, FACS Trauma & General Surgery Use [email or web address] to contact on call provider  08/28/2021  *Care during the described time interval was provided by me. I have reviewed this patient's available data, including medical history, events of note, physical examination and test results as part of my evaluation.

## 2021-08-29 LAB — CBC
HCT: 31.7 % — ABNORMAL LOW (ref 39.0–52.0)
Hemoglobin: 10.3 g/dL — ABNORMAL LOW (ref 13.0–17.0)
MCH: 32.7 pg (ref 26.0–34.0)
MCHC: 32.5 g/dL (ref 30.0–36.0)
MCV: 100.6 fL — ABNORMAL HIGH (ref 80.0–100.0)
Platelets: 230 10*3/uL (ref 150–400)
RBC: 3.15 MIL/uL — ABNORMAL LOW (ref 4.22–5.81)
RDW: 13.6 % (ref 11.5–15.5)
WBC: 6.9 10*3/uL (ref 4.0–10.5)
nRBC: 0 % (ref 0.0–0.2)

## 2021-08-29 LAB — GLUCOSE, CAPILLARY
Glucose-Capillary: 120 mg/dL — ABNORMAL HIGH (ref 70–99)
Glucose-Capillary: 130 mg/dL — ABNORMAL HIGH (ref 70–99)
Glucose-Capillary: 135 mg/dL — ABNORMAL HIGH (ref 70–99)
Glucose-Capillary: 137 mg/dL — ABNORMAL HIGH (ref 70–99)
Glucose-Capillary: 141 mg/dL — ABNORMAL HIGH (ref 70–99)
Glucose-Capillary: 154 mg/dL — ABNORMAL HIGH (ref 70–99)
Glucose-Capillary: 159 mg/dL — ABNORMAL HIGH (ref 70–99)

## 2021-08-29 MED ORDER — HYDRALAZINE HCL 10 MG PO TABS
10.0000 mg | ORAL_TABLET | Freq: Four times a day (QID) | ORAL | Status: DC | PRN
  Administered 2021-08-29: 10 mg
  Filled 2021-08-29 (×2): qty 1

## 2021-08-29 MED ORDER — METOPROLOL TARTRATE 25 MG/10 ML ORAL SUSPENSION
25.0000 mg | Freq: Two times a day (BID) | ORAL | Status: DC
  Administered 2021-08-29 – 2021-08-30 (×3): 25 mg
  Filled 2021-08-29 (×3): qty 10

## 2021-08-29 NOTE — Progress Notes (Signed)
Patient ID: [name], male   DOB: December 16, 1955, 65 y.o.   MRN: [id number] Follow up - Trauma Critical Care  Patient Details:    [name] is an 65 y.o. male.  Lines/tubes : Urethral Catheter Wyn Quaker, RN 16 Fr. (Active)  Indication for Insertion or Continuance of Catheter Bladder outlet obstruction / other urologic reason 08/29/21 0752  Site Assessment Clean;Intact 08/29/21 0751  Catheter Maintenance Bag below level of bladder 08/29/21 0751  Collection Container Standard drainage bag 08/29/21 0751  Securement Method Securing device (Describe) 08/29/21 0751  Urinary Catheter Interventions (if applicable) Flushed [id number] 0751  Output (mL) 100 mL 08/29/21 0600    Microbiology/Sepsis markers: Results for orders placed or performed during the hospital encounter of 07/27/21  Resp Panel by RT-PCR (Flu A&B, Covid) Nasopharyngeal Swab     Status: None   Collection Time: 07/27/21  3:34 PM   Specimen: Nasopharyngeal Swab; Nasopharyngeal(NP) swabs in vial transport medium  Result Value Ref Range Status   SARS Coronavirus 2 by RT PCR NEGATIVE NEGATIVE Final    Comment: (NOTE) SARS-CoV-2 target nucleic acids are NOT DETECTED.  The SARS-CoV-2 RNA is generally detectable in upper respiratory specimens during the acute phase of infection. The lowest concentration of SARS-CoV-2 viral copies this assay can detect is 138 copies/mL. A negative result does not preclude SARS-Cov-2 infection and should not be used as the sole basis for treatment or other patient management decisions. A negative result may occur with  improper specimen collection/handling, submission of specimen other than nasopharyngeal swab, presence of viral mutation(s) within the areas targeted by this assay, and inadequate number of viral copies(<138 copies/mL). A negative result must be combined with clinical observations, patient history, and epidemiological information. The expected result is  Negative.  Fact Sheet for Patients:  EntrepreneurPulse.com.au  Fact Sheet for Healthcare Providers:  IncredibleEmployment.be  This test is no t yet approved or cleared by the [place] FDA and  has been authorized for detection and/or diagnosis of SARS-CoV-2 by FDA under an Emergency Use Authorization (EUA). This EUA will remain  in effect (meaning this test can be used) for the duration of the COVID-19 declaration under Section 564(b)(1) of the Act, 21 U.S.C.section 360bbb-3(b)(1), unless the authorization is terminated  or revoked sooner.       Influenza A by PCR NEGATIVE NEGATIVE Final   Influenza B by PCR NEGATIVE NEGATIVE Final    Comment: (NOTE) The Xpert Xpress SARS-CoV-2/FLU/RSV plus assay is intended as an aid in the diagnosis of influenza from Nasopharyngeal swab specimens and should not be used as a sole basis for treatment. Nasal washings and aspirates are unacceptable for Xpert Xpress SARS-CoV-2/FLU/RSV testing.  Fact Sheet for Patients: EntrepreneurPulse.com.au  Fact Sheet for Healthcare Providers: IncredibleEmployment.be  This test is not yet approved or cleared by the [place] FDA and has been authorized for detection and/or diagnosis of SARS-CoV-2 by FDA under an Emergency Use Authorization (EUA). This EUA will remain in effect (meaning this test can be used) for the duration of the COVID-19 declaration under Section 564(b)(1) of the Act, 21 U.S.C. section 360bbb-3(b)(1), unless the authorization is terminated or revoked.  Performed at [facility], [place] [address] [place], [place] [address]   Surgical PCR screen     Status: None   Collection Time: 07/27/21  5:54 PM   Specimen: Nasal Mucosa; Nasal Swab  Result Value Ref Range Status   MRSA, PCR NEGATIVE NEGATIVE Final   Staphylococcus aureus NEGATIVE  NEGATIVE Final    Comment: (NOTE) The Xpert SA Assay (FDA approved  for NASAL specimens in patients 60 years of age and older), is one component of a comprehensive surveillance program. It is not intended to diagnose infection nor to guide or monitor treatment. Performed at [facility], [place] [address], [place], [place] [address]   Culture, Respiratory w Gram Stain     Status: None   Collection Time: 08/04/21 11:53 AM   Specimen: Tracheal Aspirate; Respiratory  Result Value Ref Range Status   Specimen Description TRACHEAL ASPIRATE  Final   Special Requests NONE  Final   Gram Stain   Final    FEW SQUAMOUS EPITHELIAL CELLS PRESENT FEW WBC SEEN FEW GRAM POSITIVE COCCI MODERATE GRAM NEGATIVE RODS    Culture   Final    ABUNDANT HAEMOPHILUS INFLUENZAE BETA LACTAMASE NEGATIVE Performed at [facility], [place] [address] [place], [place] [address]    Report Status 08/05/2021 FINAL  Final  Culture, Respiratory w Gram Stain     Status: None   Collection Time: 08/18/21  8:22 AM   Specimen: Tracheal Aspirate; Respiratory  Result Value Ref Range Status   Specimen Description TRACHEAL ASPIRATE  Final   Special Requests NONE  Final   Gram Stain   Final    NO WBC SEEN FEW GRAM VARIABLE ROD RARE GRAM POSITIVE COCCI IN PAIRS Performed at [facility], [place] [address] [place], [place] [address]    Culture   Final    MODERATE SERRATIA MARCESCENS MODERATE STENOTROPHOMONAS MALTOPHILIA    Report Status 08/21/2021 FINAL  Final   Organism ID, Bacteria SERRATIA MARCESCENS  Final   Organism ID, Bacteria STENOTROPHOMONAS MALTOPHILIA  Final      Susceptibility   Stenotrophomonas maltophilia - MIC*    LEVOFLOXACIN 0.5 SENSITIVE Sensitive     TRIMETH/SULFA <=20 SENSITIVE Sensitive     * MODERATE STENOTROPHOMONAS MALTOPHILIA   Serratia marcescens - MIC*    CEFAZOLIN >=64 RESISTANT Resistant     CEFEPIME <=0.12 SENSITIVE Sensitive     CEFTAZIDIME <=1 SENSITIVE Sensitive     CEFTRIAXONE <=0.25 SENSITIVE Sensitive     CIPROFLOXACIN <=0.25 SENSITIVE  Sensitive     GENTAMICIN <=1 SENSITIVE Sensitive     TRIMETH/SULFA <=20 SENSITIVE Sensitive     * MODERATE SERRATIA MARCESCENS    Anti-infectives:  Anti-infectives (From admission, onward)    Start     Dose/Rate Route Frequency Ordered Stop   08/27/21 1000  fosfomycin (MONUROL) packet 3 g  Status:  Discontinued        3 g Per Tube Daily 08/27/21 0843 08/27/21 0920   08/23/21 1630  sulfamethoxazole-trimethoprim (BACTRIM) 319.04 mg in dextrose 5 % 500 mL IVPB        10 mg/kg/day  95.7 kg (Adjusted) 346.6 mL/hr over 90 Minutes Intravenous Every 8 hours 08/23/21 1531 08/26/21 2258   08/20/21 1215  levofloxacin (LEVAQUIN) IVPB 750 mg  Status:  Discontinued        750 mg 100 mL/hr over 90 Minutes Intravenous Daily 08/20/21 1126 08/23/21 1531   08/06/21 0915  cefTRIAXone (ROCEPHIN) 2 g in sodium chloride 0.9 % 100 mL IVPB        2 g 200 mL/hr over 30 Minutes Intravenous Every 24 hours 08/06/21 0818 08/12/21 1103   07/29/21 1800  ceFAZolin (ANCEF) IVPB 2g/100 mL premix        2 g 200 mL/hr over 30 Minutes Intravenous Every 8 hours 07/29/21 1241 07/30/21  1112   07/28/21 0815  ceFAZolin (ANCEF) IVPB 2g/100 mL premix  Status:  Discontinued        2 g 200 mL/hr over 30 Minutes Intravenous On call to O.R. 07/27/21 1744 07/28/21 0900       Best Practice/Protocols:  VTE Prophylaxis: Lovenox (prophylaxtic dose) Intermittent Sedation  Consults: Treatment Team:  Myrene Galas, MD    Studies:    Events:  Subjective:    Overnight Issues:   Objective:  Vital signs for last 24 hours: Temp:  [97.9 F (36.6 C)-98.8 F (37.1 C)] 98.2 F (36.8 C) (12/11 0700) Pulse Rate:  [98-120] 111 (12/11 0804) Resp:  [12-35] 20 (12/11 0804) BP: (107-178)/(74-106) 147/96 (12/11 0700) SpO2:  [82 %-99 %] 95 % (12/11 0804) FiO2 (%):  [40 %-60 %] 40 % (12/11 0804) Weight:  [106.9 kg] 106.9 kg (12/11 0500)  Hemodynamic parameters for last 24 hours:    Intake/Output from previous day: 12/10  0701 - 12/11 0700 In: 2060 [NG/GT:2060] Out: 2150 [Urine:2150]  Intake/Output this shift: No intake/output data recorded.  Vent settings for last 24 hours: FiO2 (%):  [40 %-60 %] 40 %  Physical Exam:  General: alert and no respiratory distress Neuro: alert and F/C HEENT/Neck: trach-clean, intact Resp: clear to auscultation bilaterally CVS: RRR GI: soft, mild dist, NT Extremities: edema 1+  Results for orders placed or performed during the hospital encounter of 07/27/21 (from the past 24 hour(s))  Glucose, capillary     Status: Abnormal   Collection Time: 08/28/21 12:02 PM  Result Value Ref Range   Glucose-Capillary 162 (H) 70 - 99 mg/dL  Glucose, capillary     Status: Abnormal   Collection Time: 08/28/21  3:51 PM  Result Value Ref Range   Glucose-Capillary 130 (H) 70 - 99 mg/dL  Glucose, capillary     Status: Abnormal   Collection Time: 08/28/21  8:29 PM  Result Value Ref Range   Glucose-Capillary 121 (H) 70 - 99 mg/dL  Glucose, capillary     Status: Abnormal   Collection Time: 08/29/21 12:08 AM  Result Value Ref Range   Glucose-Capillary 141 (H) 70 - 99 mg/dL  CBC     Status: Abnormal   Collection Time: 08/29/21  3:06 AM  Result Value Ref Range   WBC 6.9 4.0 - 10.5 K/uL   RBC 3.15 (L) 4.22 - 5.81 MIL/uL   Hemoglobin 10.3 (L) 13.0 - 17.0 g/dL   HCT 25.3 (L) 66.4 - 40.3 %   MCV 100.6 (H) 80.0 - 100.0 fL   MCH 32.7 26.0 - 34.0 pg   MCHC 32.5 30.0 - 36.0 g/dL   RDW 47.4 25.9 - 56.3 %   Platelets 230 150 - 400 K/uL   nRBC 0.0 0.0 - 0.2 %  Glucose, capillary     Status: Abnormal   Collection Time: 08/29/21  4:33 AM  Result Value Ref Range   Glucose-Capillary 159 (H) 70 - 99 mg/dL  Glucose, capillary     Status: Abnormal   Collection Time: 08/29/21  7:31 AM  Result Value Ref Range   Glucose-Capillary 154 (H) 70 - 99 mg/dL    Assessment & Plan: Present on Admission:  Fall    LOS: 33 days   Additional comments:I reviewed the patient's new clinical lab test  results. . Fall off ladder   Acute hypoxic ventilator dependent respiratory failure - intubated 11/9, self-extubated and promptly re-intubated 11/12 AM. Extubated and reintubated 11/21. Trach 11/22, now back on vent, continue robinul and  guaifenesin. HTC as able (on it now)-  PMV Agitation/delirium - ongoing, appreciate Psychiatry assist  R PTX - resolved R rib fractures - aggressive multimodal pain control, IS, pulm toilet Pelvic fx (L sacrum, R iliac, b/l inf and L sup pubic rami) with pelvic hematoma - ortho c/s, Dr. Marcelino Scot, SI screw 11/10 H/o alcohol abuse  COPD - prn duonebs, guaifenesin, trach 11/22, see above Hyperglycemia - SSI, glargine 5u, glc well-controlled Anemia - stable, monitor HTN - schedule lopressor and hydralizine prn Hx of Hep C  FEN: NPO, TF DVT - SCDs, LMWH Foley - replaced for retention 11/17, 11/22, 11/29 Hematuria - U/A unrevealing, no S/S infection. Per Urology add finasteride but cannot give per tube, improving slowly ID - CTX x7d for H flu in 11/16 resp cx, end 11/24; repeat resp cx/CXR sent 11/30 with Serratia and Steno, changed to Bactrim to complete 7d Dispo - ICU, HTC as able (24h now), TOC has been working on [facility] placement but if stays off vent may be able to do [place] Total Time*: 34 Minutes  Georganna Skeans, MD, MPH, FACS Trauma & General Surgery Use [email or web address] to contact on call provider  08/29/2021  *Care during the described time interval was provided by me. I have reviewed this patient's available data, including medical history, events of note, physical examination and test results as part of my evaluation.

## 2021-08-29 NOTE — Progress Notes (Signed)
Pt persistently attempting to get out of bed. Non-violent restraints and bed alarm in place. Unable to reorient pt to stay in bed. Attempts to get out of bed immediately upon exit from room.

## 2021-08-30 ENCOUNTER — Other Ambulatory Visit (HOSPITAL_COMMUNITY): Payer: Self-pay

## 2021-08-30 ENCOUNTER — Inpatient Hospital Stay
Admission: AD | Admit: 2021-08-30 | Discharge: 2021-09-22 | Disposition: A | Discharge status: Other Acute Inpatient Hospital | Payer: BC Managed Care – PPO | Source: Other Acute Inpatient Hospital | Attending: Internal Medicine | Admitting: Internal Medicine

## 2021-08-30 DIAGNOSIS — Z4659 Encounter for fitting and adjustment of other gastrointestinal appliance and device: Secondary | ICD-10-CM

## 2021-08-30 DIAGNOSIS — S32810A Multiple fractures of pelvis with stable disruption of pelvic ring, initial encounter for closed fracture: Secondary | ICD-10-CM

## 2021-08-30 DIAGNOSIS — F05 Delirium due to known physiological condition: Secondary | ICD-10-CM

## 2021-08-30 DIAGNOSIS — J9690 Respiratory failure, unspecified, unspecified whether with hypoxia or hypercapnia: Secondary | ICD-10-CM

## 2021-08-30 DIAGNOSIS — F1011 Alcohol abuse, in remission: Secondary | ICD-10-CM

## 2021-08-30 DIAGNOSIS — S270XXA Traumatic pneumothorax, initial encounter: Secondary | ICD-10-CM

## 2021-08-30 DIAGNOSIS — J189 Pneumonia, unspecified organism: Secondary | ICD-10-CM

## 2021-08-30 DIAGNOSIS — J9621 Acute and chronic respiratory failure with hypoxia: Secondary | ICD-10-CM

## 2021-08-30 DIAGNOSIS — J159 Unspecified bacterial pneumonia: Secondary | ICD-10-CM

## 2021-08-30 DIAGNOSIS — J90 Pleural effusion, not elsewhere classified: Secondary | ICD-10-CM

## 2021-08-30 DIAGNOSIS — Z9889 Other specified postprocedural states: Secondary | ICD-10-CM

## 2021-08-30 LAB — GLUCOSE, CAPILLARY
Glucose-Capillary: 138 mg/dL — ABNORMAL HIGH (ref 70–99)
Glucose-Capillary: 137 mg/dL — ABNORMAL HIGH (ref 70–99)
Glucose-Capillary: 137 mg/dL — ABNORMAL HIGH (ref 70–99)

## 2021-08-30 MED ORDER — GABAPENTIN 250 MG/5ML PO SOLN: 300.0000 mg | Freq: Three times a day (TID) | ORAL | 12 refills | Status: DC

## 2021-08-30 MED ORDER — HYDROCORTISONE 1 % EX CREA: TOPICAL_CREAM | Freq: Three times a day (TID) | CUTANEOUS | 0 refills | Status: DC

## 2021-08-30 MED ORDER — PANTOPRAZOLE SODIUM 40 MG PO PACK: 40.0000 mg | PACK | Freq: Every day | ORAL | Status: DC

## 2021-08-30 MED ORDER — METOPROLOL TARTRATE 25 MG/10 ML ORAL SUSPENSION: 25.0000 mg | Freq: Two times a day (BID) | ORAL | Status: DC

## 2021-08-30 MED ORDER — QUETIAPINE FUMARATE 100 MG PO TABS: 100.0000 mg | ORAL_TABLET | Freq: Every morning | ORAL | Status: DC

## 2021-08-30 MED ORDER — THIAMINE HCL 100 MG/ML IJ SOLN: 100.0000 mg | Freq: Every day | INTRAMUSCULAR | Status: DC

## 2021-08-30 MED ORDER — INSULIN ASPART 100 UNIT/ML IJ SOLN: 0.0000 [IU] | INTRAMUSCULAR | 11 refills | Status: DC

## 2021-08-30 MED ORDER — HYDROXYZINE HCL 25 MG PO TABS: 25.0000 mg | ORAL_TABLET | Freq: Four times a day (QID) | ORAL | 0 refills | Status: DC

## 2021-08-30 MED ORDER — IPRATROPIUM-ALBUTEROL 0.5-2.5 (3) MG/3ML IN SOLN
3.0000 mL | Freq: Four times a day (QID) | RESPIRATORY_TRACT | Status: DC | PRN
Start: 2021-08-30 — End: 2022-02-07

## 2021-08-30 MED ORDER — GUAIFENESIN 100 MG/5ML PO LIQD: 10.0000 mL | ORAL | 0 refills | Status: DC

## 2021-08-30 MED ORDER — MELATONIN 3 MG PO TABS: 3.0000 mg | ORAL_TABLET | Freq: Every day | ORAL | 0 refills | Status: DC

## 2021-08-30 MED ORDER — TRAZODONE HCL 50 MG PO TABS: 25.0000 mg | ORAL_TABLET | Freq: Every day | ORAL | Status: DC

## 2021-08-30 MED ORDER — HYDROXYZINE HCL 25 MG PO TABS
25.0000 mg | ORAL_TABLET | Freq: Four times a day (QID) | ORAL | 0 refills | Status: DC | PRN
Start: 2021-08-30 — End: 2022-02-07

## 2021-08-30 MED ORDER — SILODOSIN 8 MG PO CAPS: 8.0000 mg | ORAL_CAPSULE | Freq: Every day | ORAL | Status: DC

## 2021-08-30 MED ORDER — SODIUM CHLORIDE 0.9 % IV SOLN: 250.0000 mL | INTRAVENOUS | 0 refills | Status: DC

## 2021-08-30 MED ORDER — HALOPERIDOL LACTATE 5 MG/ML IJ SOLN: 5.0000 mg | Freq: Four times a day (QID) | INTRAMUSCULAR | 0 refills | Status: DC | PRN

## 2021-08-30 MED ORDER — ENOXAPARIN SODIUM 40 MG/0.4ML IJ SOSY: 40.0000 mg | PREFILLED_SYRINGE | Freq: Two times a day (BID) | INTRAMUSCULAR | Status: DC

## 2021-08-30 MED ORDER — ADULT MULTIVITAMIN W/MINERALS CH: 1.0000 | ORAL_TABLET | Freq: Every day | ORAL | Status: DC

## 2021-08-30 MED ORDER — VITAMIN D3 25 MCG PO TABS
2000.0000 [IU] | ORAL_TABLET | Freq: Every day | ORAL | Status: DC
Start: 2021-08-31 — End: 2022-10-17

## 2021-08-30 MED ORDER — FOLIC ACID 1 MG PO TABS: 1.0000 mg | ORAL_TABLET | Freq: Every day | ORAL | Status: DC

## 2021-08-30 MED ORDER — BETHANECHOL CHLORIDE 25 MG PO TABS: 25.0000 mg | ORAL_TABLET | Freq: Three times a day (TID) | ORAL | Status: DC

## 2021-08-30 MED ORDER — IBUPROFEN 600 MG PO TABS: 600.0000 mg | ORAL_TABLET | Freq: Four times a day (QID) | ORAL | 0 refills | Status: DC | PRN

## 2021-08-30 MED ORDER — FREE WATER: 250.0000 mL | Freq: Four times a day (QID) | Status: DC

## 2021-08-30 MED ORDER — PROSOURCE TF PO LIQD: 90.0000 mL | Freq: Two times a day (BID) | ORAL | Status: DC

## 2021-08-30 MED ORDER — METHOCARBAMOL 1000 MG PO TABS
1000.0000 mg | ORAL_TABLET | Freq: Three times a day (TID) | ORAL | Status: DC
Start: 2021-08-30 — End: 2022-02-07

## 2021-08-30 MED ORDER — ACETAMINOPHEN 500 MG PO TABS: 1000.0000 mg | ORAL_TABLET | Freq: Four times a day (QID) | ORAL | 0 refills | Status: DC

## 2021-08-30 MED ORDER — QUETIAPINE FUMARATE 200 MG PO TABS: 200.0000 mg | ORAL_TABLET | Freq: Every day | ORAL | Status: DC

## 2021-08-30 MED ORDER — CHLORHEXIDINE GLUCONATE CLOTH 2 % EX PADS: 6.0000 | MEDICATED_PAD | Freq: Every day | CUTANEOUS | Status: DC

## 2021-08-30 MED ORDER — OXYCODONE HCL 5 MG/5ML PO SOLN: 5.0000 mg | ORAL | 0 refills | Status: DC | PRN

## 2021-08-30 MED ORDER — INSULIN GLARGINE-YFGN 100 UNIT/ML ~~LOC~~ SOLN: 5.0000 [IU] | Freq: Every day | SUBCUTANEOUS | 11 refills | Status: DC

## 2021-08-30 MED ORDER — JEVITY 1.5 CAL/FIBER PO LIQD
1000.0000 mL | ORAL | Status: DC
Start: 2021-08-30 — End: 2022-02-07

## 2021-08-30 MED ORDER — PRAVASTATIN SODIUM 40 MG PO TABS: 40.0000 mg | ORAL_TABLET | Freq: Every day | ORAL | Status: DC

## 2021-08-30 MED ORDER — ASPIRIN 81 MG PO CHEW: 81.0000 mg | CHEWABLE_TABLET | Freq: Every day | ORAL | Status: DC

## 2021-08-30 MED ORDER — ONDANSETRON 4 MG PO TBDP
4.0000 mg | ORAL_TABLET | Freq: Four times a day (QID) | ORAL | 0 refills | Status: DC | PRN
Start: 2021-08-30 — End: 2022-02-07

## 2021-08-30 MED ORDER — LIDOCAINE 5 % EX PTCH
1.0000 | MEDICATED_PATCH | CUTANEOUS | 0 refills | Status: DC
Start: 2021-08-31 — End: 2022-02-07

## 2021-08-30 MED ORDER — POLYETHYLENE GLYCOL 3350 17 G PO PACK
17.0000 g | PACK | Freq: Every day | ORAL | 0 refills | Status: DC | PRN
Start: 2021-08-30 — End: 2022-02-07

## 2021-08-30 MED ORDER — GLYCOPYRROLATE 1 MG PO TABS: 2.0000 mg | ORAL_TABLET | Freq: Three times a day (TID) | ORAL | Status: DC

## 2021-08-30 MED ORDER — THIAMINE HCL 100 MG PO TABS: 100.0000 mg | ORAL_TABLET | Freq: Every day | ORAL | Status: DC

## 2021-08-30 NOTE — Progress Notes (Signed)
Physical Therapy Treatment Patient Details Name: [name] MRN: [id number] DOB: 1956-06-30 Today's Date: 08/30/2021   History of Present Illness 65 y/o male presented to ED on 07/27/21 after fall off 8 foot ladder. CT chest found small R apical pneumothorax, multiple displaced posterior R rib fxs, nondisplaced R iliac wing fx with widening of R SI joint, displaced fx both inferior pubic rami and L superior pubic ramus, and nondisplaced L sacral fx, SI screw 07/29/21. intubated 11/9, trach 11/22. Trach collar 11/30, back on vent on/off. PMH: COPD, EtOH abuse, HTN, GERD, hep C    PT Comments    Pt with improved cognition and ability to follow commands and assist with transfer to chair today. Pt more aware and less impulsive this session. Pt successful with lateral scoot transfer with max directional verbal cues and modAx2. Pt participate in LE exercises and reports no pain. Pt remains on trach collar with passy muir valve. Acute PT to cont to follow.    Recommendations for follow up therapy are one component of a multi-disciplinary discharge planning process, led by the attending physician.  Recommendations may be updated based on patient status, additional functional criteria and insurance authorization.  Follow Up Recommendations  PT at Long-term acute care hospital     Assistance Recommended at Discharge Frequent or constant Supervision/Assistance  Equipment Recommendations  BSC/3in1;Wheelchair (measurements PT);Wheelchair cushion (measurements PT);Hospital bed;Rolling walker (2 wheels)    Recommendations for Other Services       Precautions / Restrictions Precautions Precautions: Fall Precaution Comments: foley, trach, cortrak, anxious Restrictions Weight Bearing Restrictions: Yes RLE Weight Bearing: Non weight bearing (WBAT for transfers only) LLE Weight Bearing: Weight bearing as tolerated     Mobility  Bed Mobility Overal bed mobility: Needs Assistance Bed Mobility:  Supine to Sit Rolling: +2 for physical assistance;Mod assist   Supine to sit: +2 for physical assistance;Mod assist;HOB elevated     General bed mobility comments: with step by step cues pt able to bring  LEs off EOB and use UEs to assist PT/OT with transfer to EOB    Transfers Overall transfer level: Needs assistance Equipment used: None Transfers: Bed to chair/wheelchair/BSC            Lateral/Scoot Transfers: +2 physical assistance;Max assist General transfer comment: pt transferring to the L side to drop arm chair, pt predominately using UEs but did place some weight through LEs to assist with transfer, step by step cues given, modA from PT/OT at bed pad to assist with clearing bottom, RN held chair for safety    Ambulation/Gait               General Gait Details: unable   Stairs             Wheelchair Mobility    Modified Rankin (Stroke Patients Only)       Balance Overall balance assessment: Needs assistance Sitting-balance support: Bilateral upper extremity supported;Feet supported Sitting balance-Leahy Scale: Fair Sitting balance - Comments: pt close min guard for EOB balance this date                                    Cognition Arousal/Alertness: Awake/alert Behavior During Therapy: Anxious Overall Cognitive Status: Impaired/Different from baseline Area of Impairment: Safety/judgement;Awareness;Problem solving;Following commands                       Following Commands: Follows  one step commands with increased time;Follows one step commands consistently Safety/Judgement: Decreased awareness of safety;Decreased awareness of deficits Awareness: Intellectual Problem Solving: Slow processing;Difficulty sequencing;Requires verbal cues;Requires tactile cues General Comments: despite chart reporting pt was agitated and trying to climb out of bed last night pt approriate and able to follow simple commands this session         Exercises General Exercises - Lower Extremity Long Arc Quad: Both;10 reps;AROM;Seated (with 5 sec hold at top) Heel Slides: AAROM;Both;10 reps;Supine Hip Flexion/Marching: AROM;Both;10 reps;Seated Heel Raises: AROM;Both;10 reps;Seated Other Exercises Other Exercises: girlfriend entire session Other Exercises: pt with dynamic balance task in chair for reaching. pt demonstrates increased coordination and less tremor movement.    General Comments General comments (skin integrity, edema, etc.): trach collar with passy muir valve      Pertinent Vitals/Pain Pain Assessment: No/denies pain    Home Living                          Prior Function            PT Goals (current goals can now be found in the care plan section) Acute Rehab PT Goals Patient Stated Goal: get better PT Goal Formulation: With patient Time For Goal Achievement: 08/31/21 Potential to Achieve Goals: Fair Progress towards PT goals: Progressing toward goals    Frequency    Min 3X/week      PT Plan Current plan remains appropriate    Co-evaluation PT/OT/SLP Co-Evaluation/Treatment: Yes Reason for Co-Treatment: Necessary to address cognition/behavior during functional activity PT goals addressed during session: Mobility/safety with mobility OT goals addressed during session: ADL's and self-care;Proper use of Adaptive equipment and DME;Strengthening/ROM      AM-PAC PT "6 Clicks" Mobility   Outcome Measure  Help needed turning from your back to your side while in a flat bed without using bedrails?: A Lot Help needed moving from lying on your back to sitting on the side of a flat bed without using bedrails?: A Lot Help needed moving to and from a bed to a chair (including a wheelchair)?: A Lot Help needed standing up from a chair using your arms (e.g., wheelchair or bedside chair)?: Total Help needed to walk in hospital room?: Total Help needed climbing 3-5 steps with a railing? :  Total 6 Click Score: 9    End of Session Equipment Utilized During Treatment: Oxygen Activity Tolerance: Patient tolerated treatment well Patient left: in chair;with chair alarm set;with family/visitor present Nurse Communication: Mobility status PT Visit Diagnosis: Muscle weakness (generalized) (M62.81);Pain;Unsteadiness on feet (R26.81) Pain - Right/Left: Right Pain - part of body: Hip     Time: 7915-0569 PT Time Calculation (min) (ACUTE ONLY): 24 min  Charges:  $Therapeutic Activity: 8-22 mins                     Lewis Shock, PT, DPT Acute Rehabilitation Services Pager #: [phone] Office #: [phone]    Iona Hansen 08/30/2021, 2:19 PM

## 2021-08-30 NOTE — Progress Notes (Signed)
Speech Language Pathology Treatment: [name] Speaking valve;Dysphagia  Patient Details Name: [name] MRN: [id number] DOB: 12/02/1955 Today's Date: 08/30/2021 Time: 1310-1346 SLP Time Calculation (min) (ACUTE ONLY): 36 min  Assessment / Plan / Recommendation Clinical Impression  Pt seen upright in chair for treatment session prior to FEES. PMSV in place, pt talkative and pleasant, joking and talking about music. Vital signs remained stable through session. Vocal quality intermittently wet, pt with frequent coughing, particularly after ice chips given. With cues able to orally expectorate. Provided education and pt, partner and daughter regarding PMSV during all waking hours, family trained to place and remove. No PMSV while sleeping.  HPI HPI: 65 y/o male presented to ED on 11/8 after fall off 8 foot ladder. CT chest found small R apical pneumothorax, multiple displaced posterior R rib fxs, nondisplaced R iliac wing fx with widening of R SI joint, displaced fx both inferior pubic rami and L superior pubic ramus, and nondisplaced L sacral fx, SI screw 07/29/21. intubated 11/9, trach 11/22. Trach collar 11/30 PMH: COPD, EtOH abuse, HTN, GERD, hep C      SLP Plan  Continue with current plan of care      Recommendations for follow up therapy are one component of a multi-disciplinary discharge planning process, led by the attending physician.  Recommendations may be updated based on patient status, additional functional criteria and insurance authorization.    Recommendations         Patient may use Passy-Muir Speech Valve: During all waking hours (remove during sleep);During PO intake/meals;Caregiver trained to provide supervision PMSV Supervision: Intermittent MD: Please consider changing trach tube to : Cuffless         Oral Care Recommendations: Oral care BID Follow Up Recommendations: Long-term institutional care without follow-up therapy Plan: Continue with current plan  of care       GO                [name], [name]  08/30/2021, 1:41 PM

## 2021-08-30 NOTE — Procedures (Signed)
Objective Swallowing Evaluation: Type of Study: FEES-Fiberoptic Endoscopic Evaluation of Swallow   Patient Details  Name: [name] MRN: [id number] Date of Birth: [birth year]  Today's Date: 08/30/2021 Time: SLP Start Time (ACUTE ONLY): 1330 -SLP Stop Time (ACUTE ONLY): 1410  SLP Time Calculation (min) (ACUTE ONLY): 40 min   Past Medical History:  Past Medical History:  Diagnosis Date   COPD (chronic obstructive pulmonary disease) (HCC)    GERD (gastroesophageal reflux disease)    History of hepatitis C    treated several years ago through [facility]   Hypertension    Past Surgical History:  Past Surgical History:  Procedure Laterality Date   CATARACT EXTRACTION, BILATERAL     EYE SURGERY Bilateral    ORIF PELVIC FRACTURE WITH PERCUTANEOUS SCREWS Right 07/29/2021   Procedure: SI SCREW FIXATION OF RIGHT PELVIC RING;  Surgeon: Myrene Galas, MD;  Location: [facility] OR;  Service: Orthopedics;  Laterality: Right;   ORTHOPEDIC SURGERY Bilateral    pt states fracture repairs to arms and legs.   REVERSE SHOULDER ARTHROPLASTY Left 09/23/2019   Procedure: REVERSE SHOULDER ARTHROPLASTY;  Surgeon: Jones Broom, MD;  Location: [facility] ORS;  Service: Orthopedics;  Laterality: Left;   TRACHEOSTOMY TUBE PLACEMENT N/A 08/10/2021   Procedure: TRACHEOSTOMY;  Surgeon: Violeta Gelinas, MD;  Location: [facility] OR;  Service: General;  Laterality: N/A;   HPI: 65 y/o male presented to ED on 11/8 after fall off 8 foot ladder. CT chest found small R apical pneumothorax, multiple displaced posterior R rib fxs, nondisplaced R iliac wing fx with widening of R SI joint, displaced fx both inferior pubic rami and L superior pubic ramus, and nondisplaced L sacral fx, SI screw 07/29/21. intubated 11/9, trach 11/22. Trach collar 11/30 PMH: COPD, EtOH abuse, HTN, GERD, hep C   Subjective: ready to have something to drink    Recommendations for follow up therapy are one component of a multi-disciplinary discharge planning  process, led by the attending physician.  Recommendations may be updated based on patient status, additional functional criteria and insurance authorization.  Assessment / Plan / Recommendation  Clinical Impressions 08/30/2021  Clinical Impression Pt demonstrates mild pharyngeal dysphagia impacted significantly by pooling of clear secretions. Pt with constant reaccummulation of secretions that mix with mild pharyngeal residue with nectar and puree and is aspirated with constant coughing to eject. Pt has a narrow pharyngeal space; though there is complete white out during the swallow and appearance of good strength, question if the epiglottis is fully achieving closure given proximity of posterior pharyngeal wall (?osteophyte). Pt seemed to benefit from a chin tuck with purees. Unfortunately further trials and therapeutic maneuvers could not be fully explored as pt was fatiguing and verbalizing discomfort in his position. Recommend consideration of downsizing pts trach size and to a cuffless trach, which may assist in movement of air and secretions through trachea. Pt may have a few bites of puree with staff with a chin tuck every hour or so. Ice chips also acceptable. Pt will cough excessively with these trials. Pt to f/u with SLP at [facility], likely to need repeat swallow assessment after potential trach change.  SLP Visit Diagnosis Dysphagia, pharyngeal phase (R13.13)  Attention and concentration deficit following --  Frontal lobe and executive function deficit following --  Impact on safety and function Moderate aspiration risk      Treatment Recommendations 08/27/2021  Treatment Recommendations Therapy as outlined in treatment plan below     Prognosis 08/30/2021  Prognosis for Safe Diet Advancement  Good  Barriers to Reach Goals --  Barriers/Prognosis Comment --    Diet Recommendations 08/30/2021  SLP Diet Recommendations Other (Comment)  Liquid Administration via Spoon  Medication  Administration Via alternative means  Compensations --  Postural Changes --      Other Recommendations 08/30/2021  Recommended Consults --  Oral Care Recommendations --  Other Recommendations --  Follow Up Recommendations Long-term institutional care without follow-up therapy  Assistance recommended at discharge --  Functional Status Assessment --    Frequency and Duration  08/27/2021  Speech Therapy Frequency (ACUTE ONLY) min 2x/week  Treatment Duration 2 weeks      Oral Phase 08/30/2021  Oral Phase WFL  Oral - Pudding Teaspoon --  Oral - Pudding Cup --  Oral - Honey Teaspoon --  Oral - Honey Cup --  Oral - Nectar Teaspoon --  Oral - Nectar Cup --  Oral - Nectar Straw --  Oral - Thin Teaspoon --  Oral - Thin Cup --  Oral - Thin Straw --  Oral - Puree --  Oral - Mech Soft --  Oral - Regular --  Oral - Multi-Consistency --  Oral - Pill --  Oral Phase - Comment --    Pharyngeal Phase 08/30/2021  Pharyngeal Phase Impaired  Pharyngeal- Pudding Teaspoon --  Pharyngeal --  Pharyngeal- Pudding Cup --  Pharyngeal --  Pharyngeal- Honey Teaspoon --  Pharyngeal --  Pharyngeal- Honey Cup --  Pharyngeal --  Pharyngeal- Nectar Teaspoon Reduced epiglottic inversion;Penetration/Apiration after swallow;Moderate aspiration;Pharyngeal residue - valleculae;Pharyngeal residue - pyriform  Pharyngeal --  Pharyngeal- Nectar Cup --  Pharyngeal --  Pharyngeal- Nectar Straw --  Pharyngeal --  Pharyngeal- Thin Teaspoon --  Pharyngeal --  Pharyngeal- Thin Cup --  Pharyngeal --  Pharyngeal- Thin Straw --  Pharyngeal --  Pharyngeal- Puree Reduced epiglottic inversion;Penetration/Apiration after swallow;Trace aspiration;Pharyngeal residue - valleculae;Pharyngeal residue - pyriform  Pharyngeal --  Pharyngeal- Mechanical Soft --  Pharyngeal --  Pharyngeal- Regular --  Pharyngeal --  Pharyngeal- Multi-consistency --  Pharyngeal --  Pharyngeal- Pill --  Pharyngeal --  Pharyngeal  Comment --     No flowsheet data found.   [name], [name] 08/30/2021, 2:30 PM
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
## 2021-08-30 NOTE — TOC Transition Note (Signed)
Transition of Care [facility]) - CM/SW Discharge Note   Patient Details  Name: [name] MRN: [id number] Date of Birth: December 19, 1955  Transition of Care [facility]) CM/SW Contact:  Glennon Mac, RN Phone Number: 08/30/2021, 1:29 PM   Clinical Narrative:    Patient medically stable for discharge today, and insurance authorization has been obtained for LTAC.  Plan transfer to [facility]; patient going to room 5 E27.  Patient's daughter notified, and is agreeable to plan.  Attending physician notified of bed availability, and agreeable to discharge.   Final next level of care: Long Term Acute Care (LTAC) Barriers to Discharge: Barriers Resolved   Patient Goals and CMS Choice   CMS Medicare.gov Compare Post Acute Care list provided to:: Patient Represenative (must comment) (daughter) Choice offered to / list presented to : Adult Children           Discharge Plan and Services   Discharge Planning Services: CM Consult Post Acute Care Choice: Long Term Acute Care (LTAC)                               Social Determinants of Health (SDOH) Interventions     Readmission Risk Interventions No flowsheet data found.  [name], RN, BSN  Trauma/Neuro ICU Case Manager [phone]

## 2021-08-30 NOTE — Consult Note (Signed)
Brief Psychiatry Consult Note  The patient was last seen by the psychiatry service on 08/27/2021. Interim documentation by primary team and nursing staff has been reviewed. At this time, patient is transferring to [facility] today and there is no evidence of acute psychiatric disturbance requiring ongoing psychiatric consultation. Please see last consult note for full assessment. Final medication recommendations are as follows:  Continue hydroxyzine 25 mg QID scheduled plus QID PRN for anxiety and secretions Continue melatonin 3 mg QHS Continue  quetiapine at  100/200 mg Continue trazodone 25 QHS     He will require psychiatric followup as an outpatient, largely to wean quetiapine and consider treatment for underlying anxiety disorder.   We will sign off at this time. This has been communicated to the primary team. If issues arise in the future, don't hesitate to reconsult the Psychiatry Inpatient Consult Service.   Aarti Mankowski A Azriel Dancy

## 2021-08-30 NOTE — Progress Notes (Signed)
Patient ID: [name], male   DOB: September 07, 1956, 66 y.o.   MRN: [id number] Follow up - Trauma Critical Care  Patient Details:    [name] is an 65 y.o. male.  Lines/tubes : Urethral Catheter Wyn Quaker, RN 16 Fr. (Active)  Indication for Insertion or Continuance of Catheter Acute urinary retention (I&O Cath for 24 hrs prior to catheter insertion- Inpatient Only) 08/30/21 0800  Site Assessment Clean;Intact 08/30/21 0800  Catheter Maintenance Bag below level of bladder;Catheter secured;Drainage bag/tubing not touching floor;Insertion date on drainage bag;No dependent loops;Bag emptied prior to transport;Seal intact 08/30/21 0800  Collection Container Standard drainage bag 08/30/21 0800  Securement Method Securing device (Describe) 08/30/21 0800  Urinary Catheter Interventions (if applicable) Unclamped [id number]  Output (mL) 200 mL 08/30/21 0900    Microbiology/Sepsis markers: Results for orders placed or performed during the hospital encounter of 07/27/21  Resp Panel by RT-PCR (Flu A&B, Covid) Nasopharyngeal Swab     Status: None   Collection Time: 07/27/21  3:34 PM   Specimen: Nasopharyngeal Swab; Nasopharyngeal(NP) swabs in vial transport medium  Result Value Ref Range Status   SARS Coronavirus 2 by RT PCR NEGATIVE NEGATIVE Final    Comment: (NOTE) SARS-CoV-2 target nucleic acids are NOT DETECTED.  The SARS-CoV-2 RNA is generally detectable in upper respiratory specimens during the acute phase of infection. The lowest concentration of SARS-CoV-2 viral copies this assay can detect is 138 copies/mL. A negative result does not preclude SARS-Cov-2 infection and should not be used as the sole basis for treatment or other patient management decisions. A negative result may occur with  improper specimen collection/handling, submission of specimen other than nasopharyngeal swab, presence of viral mutation(s) within the areas targeted by this assay, and inadequate  number of viral copies(<138 copies/mL). A negative result must be combined with clinical observations, patient history, and epidemiological information. The expected result is Negative.  Fact Sheet for Patients:  EntrepreneurPulse.com.au  Fact Sheet for Healthcare Providers:  IncredibleEmployment.be  This test is no t yet approved or cleared by the [place] FDA and  has been authorized for detection and/or diagnosis of SARS-CoV-2 by FDA under an Emergency Use Authorization (EUA). This EUA will remain  in effect (meaning this test can be used) for the duration of the COVID-19 declaration under Section 564(b)(1) of the Act, 21 U.S.C.section 360bbb-3(b)(1), unless the authorization is terminated  or revoked sooner.       Influenza A by PCR NEGATIVE NEGATIVE Final   Influenza B by PCR NEGATIVE NEGATIVE Final    Comment: (NOTE) The Xpert Xpress SARS-CoV-2/FLU/RSV plus assay is intended as an aid in the diagnosis of influenza from Nasopharyngeal swab specimens and should not be used as a sole basis for treatment. Nasal washings and aspirates are unacceptable for Xpert Xpress SARS-CoV-2/FLU/RSV testing.  Fact Sheet for Patients: EntrepreneurPulse.com.au  Fact Sheet for Healthcare Providers: IncredibleEmployment.be  This test is not yet approved or cleared by the [place] FDA and has been authorized for detection and/or diagnosis of SARS-CoV-2 by FDA under an Emergency Use Authorization (EUA). This EUA will remain in effect (meaning this test can be used) for the duration of the COVID-19 declaration under Section 564(b)(1) of the Act, 21 U.S.C. section 360bbb-3(b)(1), unless the authorization is terminated or revoked.  Performed at [facility], [place] [address], [place], [place] [address]   Surgical PCR screen     Status: None   Collection Time: 07/27/21  5:54 PM  Specimen: Nasal Mucosa;  Nasal Swab  Result Value Ref Range Status   MRSA, PCR NEGATIVE NEGATIVE Final   Staphylococcus aureus NEGATIVE NEGATIVE Final    Comment: (NOTE) The Xpert SA Assay (FDA approved for NASAL specimens in patients 69 years of age and older), is one component of a comprehensive surveillance program. It is not intended to diagnose infection nor to guide or monitor treatment. Performed at [facility], [place] [address], [place], [place] [address]   Culture, Respiratory w Gram Stain     Status: None   Collection Time: 08/04/21 11:53 AM   Specimen: Tracheal Aspirate; Respiratory  Result Value Ref Range Status   Specimen Description TRACHEAL ASPIRATE  Final   Special Requests NONE  Final   Gram Stain   Final    FEW SQUAMOUS EPITHELIAL CELLS PRESENT FEW WBC SEEN FEW GRAM POSITIVE COCCI MODERATE GRAM NEGATIVE RODS    Culture   Final    ABUNDANT HAEMOPHILUS INFLUENZAE BETA LACTAMASE NEGATIVE Performed at [facility], [place] [address], [place], [place] [address]    Report Status 08/05/2021 FINAL  Final  Culture, Respiratory w Gram Stain     Status: None   Collection Time: 08/18/21  8:22 AM   Specimen: Tracheal Aspirate; Respiratory  Result Value Ref Range Status   Specimen Description TRACHEAL ASPIRATE  Final   Special Requests NONE  Final   Gram Stain   Final    NO WBC SEEN FEW GRAM VARIABLE ROD RARE GRAM POSITIVE COCCI IN PAIRS Performed at [facility], [place] [address] [place], [place] [address]    Culture   Final    MODERATE SERRATIA MARCESCENS MODERATE STENOTROPHOMONAS MALTOPHILIA    Report Status 08/21/2021 FINAL  Final   Organism ID, Bacteria SERRATIA MARCESCENS  Final   Organism ID, Bacteria STENOTROPHOMONAS MALTOPHILIA  Final      Susceptibility   Stenotrophomonas maltophilia - MIC*    LEVOFLOXACIN 0.5 SENSITIVE Sensitive     TRIMETH/SULFA <=20 SENSITIVE Sensitive     * MODERATE STENOTROPHOMONAS MALTOPHILIA   Serratia marcescens - MIC*    CEFAZOLIN  >=64 RESISTANT Resistant     CEFEPIME <=0.12 SENSITIVE Sensitive     CEFTAZIDIME <=1 SENSITIVE Sensitive     CEFTRIAXONE <=0.25 SENSITIVE Sensitive     CIPROFLOXACIN <=0.25 SENSITIVE Sensitive     GENTAMICIN <=1 SENSITIVE Sensitive     TRIMETH/SULFA <=20 SENSITIVE Sensitive     * MODERATE SERRATIA MARCESCENS    Anti-infectives:  Anti-infectives (From admission, onward)    Start     Dose/Rate Route Frequency Ordered Stop   08/27/21 1000  fosfomycin (MONUROL) packet 3 g  Status:  Discontinued        3 g Per Tube Daily 08/27/21 0843 08/27/21 0920   08/23/21 1630  sulfamethoxazole-trimethoprim (BACTRIM) 319.04 mg in dextrose 5 % 500 mL IVPB        10 mg/kg/day  95.7 kg (Adjusted) 346.6 mL/hr over 90 Minutes Intravenous Every 8 hours 08/23/21 1531 08/26/21 2258   08/20/21 1215  levofloxacin (LEVAQUIN) IVPB 750 mg  Status:  Discontinued        750 mg 100 mL/hr over 90 Minutes Intravenous Daily 08/20/21 1126 08/23/21 1531   08/06/21 0915  cefTRIAXone (ROCEPHIN) 2 g in sodium chloride 0.9 % 100 mL IVPB        2 g 200 mL/hr over 30 Minutes Intravenous Every 24 hours 08/06/21 0818 08/12/21 1103   07/29/21 1800  ceFAZolin (ANCEF) IVPB 2g/100  mL premix        2 g 200 mL/hr over 30 Minutes Intravenous Every 8 hours 07/29/21 1241 07/30/21 1112   07/28/21 0815  ceFAZolin (ANCEF) IVPB 2g/100 mL premix  Status:  Discontinued        2 g 200 mL/hr over 30 Minutes Intravenous On call to O.R. 07/27/21 1744 07/28/21 0900       Best Practice/Protocols:  VTE Prophylaxis: Lovenox (prophylaxtic dose) Intermittent Sedation  Consults: Treatment Team:  Myrene GalasHandy, Michael, MD    Studies:    Events:  Subjective:    Overnight Issues:   Objective:  Vital signs for last 24 hours: Temp:  [98.1 F (36.7 C)-99 F (37.2 C)] 98.7 F (37.1 C) (12/12 0800) Pulse Rate:  [81-105] 92 (12/12 0900) Resp:  [11-30] 19 (12/12 0900) BP: (93-159)/(64-103) 142/92 (12/12 0900) SpO2:  [90 %-98 %] 91 % (12/12  0900) FiO2 (%):  [40 %] 40 % (12/12 0800) Weight:  [107.1 kg] 107.1 kg (12/12 0500)  Hemodynamic parameters for last 24 hours:    Intake/Output from previous day: 12/11 0701 - 12/12 0700 In: 780 [NG/GT:780] Out: 2675 [Urine:2675]  Intake/Output this shift: Total I/O In: 130 [NG/GT:130] Out: 450 [Urine:450]  Vent settings for last 24 hours: Vent Mode: Stand-by FiO2 (%):  [40 %] 40 %  Physical Exam:  General: alert and no respiratory distress Neuro: alert and F/C well HEENT/Neck: trach-clean, intact Resp: clear after cough, a lot of secreetions CVS: RRR GI: soft, nontender, BS WNL, no r/g Extremities: less edema  Results for orders placed or performed during the hospital encounter of 07/27/21 (from the past 24 hour(s))  Glucose, capillary     Status: Abnormal   Collection Time: 08/29/21 11:30 AM  Result Value Ref Range   Glucose-Capillary 120 (H) 70 - 99 mg/dL  Glucose, capillary     Status: Abnormal   Collection Time: 08/29/21  3:16 PM  Result Value Ref Range   Glucose-Capillary 137 (H) 70 - 99 mg/dL  Glucose, capillary     Status: Abnormal   Collection Time: 08/29/21  7:46 PM  Result Value Ref Range   Glucose-Capillary 130 (H) 70 - 99 mg/dL  Glucose, capillary     Status: Abnormal   Collection Time: 08/29/21 11:29 PM  Result Value Ref Range   Glucose-Capillary 135 (H) 70 - 99 mg/dL  Glucose, capillary     Status: Abnormal   Collection Time: 08/30/21  3:01 AM  Result Value Ref Range   Glucose-Capillary 138 (H) 70 - 99 mg/dL  Glucose, capillary     Status: Abnormal   Collection Time: 08/30/21  7:36 AM  Result Value Ref Range   Glucose-Capillary 137 (H) 70 - 99 mg/dL    Assessment & Plan: Present on Admission:  Fall    LOS: 34 days   Additional comments:I reviewed the patient's new clinical lab test results. , Fall off ladder   Acute hypoxic ventilator dependent respiratory failure - intubated 11/9, self-extubated and promptly re-intubated 11/12 AM.  Extubated and reintubated 11/21. Trach 11/22, continue robinul and guaifenesin. HTC  Agitation/delirium - ongoing, appreciate Psychiatry assist  R PTX - resolved R rib fractures - aggressive multimodal pain control, IS, pulm toilet Pelvic fx (L sacrum, R iliac, b/l inf and L sup pubic rami) with pelvic hematoma - ortho c/s, Dr. Carola FrostHandy, SI screw 11/10 H/o alcohol abuse  COPD - prn duonebs, guaifenesin, trach 11/22, see above Hyperglycemia - SSI, glargine 5u, glc well-controlled Anemia - stable, monitor HTN -  scheduled lopressor and hydralizine prn Hx of Hep C  FEN: NPO, TF DVT - SCDs, LMWH Foley - replaced for retention 11/17, 11/22, 11/29 Hematuria - U/A unrevealing, no S/S infection. Per Urology add finasteride but cannot give per tube, improving  ID - CTX x7d for H flu in 11/16 resp cx, end 11/24; repeat resp cx/CXR sent 11/30 with Serratia and Steno, changed to Bactrim to complete 7d Dispo - ICU, HTC as able (48h now), still too many secretions to go to 4NP, TOC has been working on [facility] placement but if stays off vent may be able to do CIR Critical Care Total Time*: 33 Minutes  Violeta Gelinas, MD, MPH, FACS Trauma & General Surgery Use [email or web address] to contact on call provider  08/30/2021  *Care during the described time interval was provided by me. I have reviewed this patient's available data, including medical history, events of note, physical examination and test results as part of my evaluation.

## 2021-08-31 LAB — COMPREHENSIVE METABOLIC PANEL
ALT: 17 U/L (ref 0–44)
AST: 21 U/L (ref 15–41)
Albumin: 3.6 g/dL (ref 3.5–5.0)
Alkaline Phosphatase: 147 U/L — ABNORMAL HIGH (ref 38–126)
Anion gap: 9 (ref 5–15)
BUN: 23 mg/dL (ref 8–23)
CO2: 27 mmol/L (ref 22–32)
Calcium: 9.4 mg/dL (ref 8.9–10.3)
Chloride: 98 mmol/L (ref 98–111)
Creatinine, Ser: 0.62 mg/dL (ref 0.61–1.24)
GFR, Estimated: >60 mL/min (ref >60)
Glucose, Bld: 133 mg/dL — ABNORMAL HIGH (ref 70–99)
Potassium: 4.1 mmol/L (ref 3.5–5.1)
Sodium: 134 mmol/L — ABNORMAL LOW (ref 135–145)
Total Bilirubin: 0.3 mg/dL (ref 0.3–1.2)
Total Protein: 7.4 g/dL (ref 6.5–8.1)

## 2021-08-31 LAB — CBC WITH DIFFERENTIAL/PLATELET
Abs Immature Granulocytes: 0.06 10*3/uL (ref 0.00–0.07)
Basophils Absolute: 0 10*3/uL (ref 0.0–0.1)
Basophils Relative: 0 %
Eosinophils Absolute: 0.1 10*3/uL (ref 0.0–0.5)
Eosinophils Relative: 1 %
HCT: 32.4 % — ABNORMAL LOW (ref 39.0–52.0)
Hemoglobin: 10.4 g/dL — ABNORMAL LOW (ref 13.0–17.0)
Immature Granulocytes: 1 %
Lymphocytes Relative: 9 %
Lymphs Abs: 0.9 10*3/uL (ref 0.7–4.0)
MCH: 32.7 pg (ref 26.0–34.0)
MCHC: 32.1 g/dL (ref 30.0–36.0)
MCV: 101.9 fL — ABNORMAL HIGH (ref 80.0–100.0)
Monocytes Absolute: 0.7 10*3/uL (ref 0.1–1.0)
Monocytes Relative: 7 %
Neutro Abs: 7.4 10*3/uL (ref 1.7–7.7)
Neutrophils Relative %: 82 %
Platelets: 290 10*3/uL (ref 150–400)
RBC: 3.18 MIL/uL — ABNORMAL LOW (ref 4.22–5.81)
RDW: 13.3 % (ref 11.5–15.5)
WBC: 9.1 10*3/uL (ref 4.0–10.5)
nRBC: 0 % (ref 0.0–0.2)

## 2021-09-02 LAB — BASIC METABOLIC PANEL
Anion gap: 7 (ref 5–15)
BUN: 20 mg/dL (ref 8–23)
CO2: 30 mmol/L (ref 22–32)
Calcium: 9 mg/dL (ref 8.9–10.3)
Chloride: 100 mmol/L (ref 98–111)
Creatinine, Ser: 0.59 mg/dL — ABNORMAL LOW (ref 0.61–1.24)
GFR, Estimated: >60 mL/min (ref >60)
Glucose, Bld: 117 mg/dL — ABNORMAL HIGH (ref 70–99)
Potassium: 3.7 mmol/L (ref 3.5–5.1)
Sodium: 137 mmol/L (ref 135–145)

## 2021-09-07 ENCOUNTER — Other Ambulatory Visit (HOSPITAL_COMMUNITY): Payer: Self-pay

## 2021-09-08 ENCOUNTER — Other Ambulatory Visit (HOSPITAL_COMMUNITY): Payer: Self-pay

## 2021-09-08 DIAGNOSIS — F05 Delirium due to known physiological condition: Secondary | ICD-10-CM

## 2021-09-08 DIAGNOSIS — S2241XA Multiple fractures of ribs, right side, initial encounter for closed fracture: Secondary | ICD-10-CM

## 2021-09-08 DIAGNOSIS — J9621 Acute and chronic respiratory failure with hypoxia: Secondary | ICD-10-CM

## 2021-09-08 DIAGNOSIS — S270XXA Traumatic pneumothorax, initial encounter: Secondary | ICD-10-CM

## 2021-09-08 DIAGNOSIS — F1011 Alcohol abuse, in remission: Secondary | ICD-10-CM

## 2021-09-08 DIAGNOSIS — J159 Unspecified bacterial pneumonia: Secondary | ICD-10-CM

## 2021-09-08 LAB — BLOOD GAS, ARTERIAL
Acid-Base Excess: 3.3 mmol/L — ABNORMAL HIGH (ref 0.0–2.0)
Acid-Base Excess: 3.5 mmol/L — ABNORMAL HIGH (ref 0.0–2.0)
Bicarbonate: 31.1 mmol/L — ABNORMAL HIGH (ref 20.0–28.0)
Bicarbonate: 29.7 mmol/L — ABNORMAL HIGH (ref 20.0–28.0)
FIO2: 80
FIO2: 80
O2 Saturation: 96 %
O2 Saturation: 99.9 %
Patient temperature: 36.7
Patient temperature: 36.7
pCO2 arterial: 85.5 mmHg (ref 32.0–48.0)
pCO2 arterial: 64.6 mmHg — ABNORMAL HIGH (ref 32.0–48.0)
pH, Arterial: 7.184 — CL (ref 7.350–7.450)
pH, Arterial: 7.283 — ABNORMAL LOW (ref 7.350–7.450)
pO2, Arterial: 92.3 mmHg (ref 83.0–108.0)
pO2, Arterial: 217 mmHg — ABNORMAL HIGH (ref 83.0–108.0)

## 2021-09-08 LAB — BASIC METABOLIC PANEL
Anion gap: 7 (ref 5–15)
BUN: 22 mg/dL (ref 8–23)
CO2: 28 mmol/L (ref 22–32)
Calcium: 9.4 mg/dL (ref 8.9–10.3)
Chloride: 98 mmol/L (ref 98–111)
Creatinine, Ser: 0.73 mg/dL (ref 0.61–1.24)
GFR, Estimated: >60 mL/min (ref >60)
Glucose, Bld: 123 mg/dL — ABNORMAL HIGH (ref 70–99)
Potassium: 5.5 mmol/L — ABNORMAL HIGH (ref 3.5–5.1)
Sodium: 133 mmol/L — ABNORMAL LOW (ref 135–145)

## 2021-09-08 LAB — CBC
HCT: 38.9 % — ABNORMAL LOW (ref 39.0–52.0)
Hemoglobin: 12.5 g/dL — ABNORMAL LOW (ref 13.0–17.0)
MCH: 32.5 pg (ref 26.0–34.0)
MCHC: 32.1 g/dL (ref 30.0–36.0)
MCV: 101 fL — ABNORMAL HIGH (ref 80.0–100.0)
Platelets: 187 10*3/uL (ref 150–400)
RBC: 3.85 MIL/uL — ABNORMAL LOW (ref 4.22–5.81)
RDW: 12.7 % (ref 11.5–15.5)
WBC: 6.7 10*3/uL (ref 4.0–10.5)
nRBC: 0 % (ref 0.0–0.2)

## 2021-09-08 LAB — POTASSIUM: Potassium: 5 mmol/L (ref 3.5–5.1)

## 2021-09-08 NOTE — Consult Note (Signed)
Pulmonary Critical Care Medicine [place]  PULMONARY SERVICE  Date of Service: 09/08/2021  PULMONARY CRITICAL CARE CONSULT   [place] Brewer  [id number]  DOB: [birth year]   DOA: 08/30/2021  Referring Physician: Satira Sark, MD  HPI: [name] is a 65 y.o. male seen for follow up of Acute on Chronic Respiratory Failure.  Patient has multiple medical problems including alcohol abuse COPD chronic anemia hypertension hepatitis C multiple fractures pneumothorax came into the hospital after having fallen off a ladder.  The patient was thrown off by 6 feet had severe chest pain and diminished breath sounds and saturations in the 70s on presentation.  Patient was found to have a pneumothorax at that time.  Intubated placed on mechanical ventilation on November 9 apparently 3 days later he self extubated however failed and had to be reintubated.  Patient underwent a tracheostomy on November 22 other complications included development of healthcare associated pneumonia which was treated with antibiotics.  Patient was transferred to our facility decannulated however had to be recannulated and now is back on the ventilator.  Review of Systems:  ROS performed and is unremarkable other than noted above.  Past Medical History:  Diagnosis Date   COPD (chronic obstructive pulmonary disease) (HCC)    GERD (gastroesophageal reflux disease)    History of hepatitis C    treated several years ago through [facility]   Hypertension     Past Surgical History:  Procedure Laterality Date   CATARACT EXTRACTION, BILATERAL     EYE SURGERY Bilateral    ORIF PELVIC FRACTURE WITH PERCUTANEOUS SCREWS Right 07/29/2021   Procedure: SI SCREW FIXATION OF RIGHT PELVIC RING;  Surgeon: Altamese Pylesville, MD;  Location: [facility];  Service: Orthopedics;  Laterality: Right;   ORTHOPEDIC SURGERY Bilateral    pt states fracture repairs to arms and legs.   REVERSE SHOULDER ARTHROPLASTY Left 09/23/2019    Procedure: REVERSE SHOULDER ARTHROPLASTY;  Surgeon: Tania Ade, MD;  Location: [facility];  Service: Orthopedics;  Laterality: Left;   TRACHEOSTOMY TUBE PLACEMENT N/A 08/10/2021   Procedure: TRACHEOSTOMY;  Surgeon: Georganna Skeans, MD;  Location: [employer] [facility];  Service: General;  Laterality: N/A;    Social History:    reports that he quit smoking about 10 years ago. His smoking use included cigarettes. He started smoking about 51 years ago. He has a 35.00 pack-year smoking history. He has never used smokeless tobacco. He reports current alcohol use of about 42.0 standard drinks per week. He reports current drug use. Drug: Marijuana.  Family History: Non-Contributory to the present illness  No Known Allergies  Medications: Reviewed on Rounds  Physical Exam:  Vitals: Temperature 98.6 pulse 92 respiratory rate is 20 blood pressure is 140/85 saturations 100%  Ventilator Settings on assist control FiO2 is 80% tidal volume 500 PEEP of 5  General: Comfortable at this time Eyes: Grossly normal lids, irises & conjunctiva ENT: grossly tongue is normal Neck: no obvious mass Cardiovascular: S1-S2 normal no gallop or rub Respiratory: Scattered rhonchi expansion is equal Abdomen: Soft and nontender Skin: no rash seen on limited exam Musculoskeletal: not rigid Psychiatric:unable to assess Neurologic: no seizure no involuntary movements         Labs on Admission:  Basic Metabolic Panel: Recent Labs  Lab 09/02/21 0417 09/08/21 0550  NA 137 133*  K 3.7 5.5*  CL 100 98  CO2 30 28  GLUCOSE 117* 123*  BUN 20 22  CREATININE 0.59* 0.73  CALCIUM 9.0 9.4  Recent Labs  Lab 09/08/21 0413 09/08/21 0620  PHART 7.184* 7.283*  PCO2ART 85.5* 64.6*  PO2ART 92.3 217*  HCO3 31.1* 29.7*  O2SAT 96.0 99.9    Liver Function Tests: No results for input(s): AST, ALT, ALKPHOS, BILITOT, PROT, ALBUMIN in the last 168 hours. No results for input(s): LIPASE, AMYLASE in the last 168 hours. No  results for input(s): AMMONIA in the last 168 hours.  CBC: Recent Labs  Lab 09/08/21 0550  WBC 6.7  HGB 12.5*  HCT 38.9*  MCV 101.0*  PLT 187    Cardiac Enzymes: No results for input(s): CKTOTAL, CKMB, CKMBINDEX, TROPONINI in the last 168 hours.  BNP (last 3 results) No results for input(s): BNP in the last 8760 hours.  ProBNP (last 3 results) Recent Labs    01/29/21 0945  PROBNP 9.0     Radiological Exams on Admission: No results found.  Assessment/Plan Active Problems:   Acute on chronic respiratory failure with hypoxia (HCC)   Traumatic fracture of ribs of right side with pneumothorax   History of alcohol abuse   Delirium due to another medical condition   Healthcare associated bacterial pneumonia   Acute on chronic respiratory failure with hypoxia patient unfortunately did not do well with the decannulation trial had to be placed back on the ventilator.  Likely issues with secretions as with the concern prior to decannulation.  Would suggest continuing with trying to wean FiO2 back down and hopefully we can quickly wean the patient back down to T collar Right-sided pneumothorax resolved we will continue to follow along chest x-rays. History of alcohol abuse no sign of active withdrawal we will continue to follow along closely. Agitation delirium patient was felt to have ICU psychosis we will continue with medical management should be noted the patient did also receive some sedation overnight Healthcare associated pneumonia patient has been treated last x-ray December 4 showed multifocal infiltrates possibly with pneumonia would recommend doing follow-up x-ray with CT scan to reevaluate the pneumothorax as well as the pulmonary infiltrates  I have personally seen and evaluated the patient, evaluated laboratory and imaging results, formulated the assessment and plan and placed orders. The Patient requires high complexity decision making with multiple systems  involvement.  Case was discussed on Rounds with the Respiratory Therapy Director and the Respiratory staff Time Spent  Yevonne Pax, MD [facility] Pulmonary Critical Care Medicine Sleep Medicine

## 2021-09-09 ENCOUNTER — Other Ambulatory Visit (HOSPITAL_COMMUNITY): Payer: Self-pay

## 2021-09-09 DIAGNOSIS — S2241XA Multiple fractures of ribs, right side, initial encounter for closed fracture: Secondary | ICD-10-CM

## 2021-09-09 DIAGNOSIS — F1011 Alcohol abuse, in remission: Secondary | ICD-10-CM

## 2021-09-09 DIAGNOSIS — J159 Unspecified bacterial pneumonia: Secondary | ICD-10-CM

## 2021-09-09 DIAGNOSIS — F05 Delirium due to known physiological condition: Secondary | ICD-10-CM

## 2021-09-09 DIAGNOSIS — S270XXA Traumatic pneumothorax, initial encounter: Secondary | ICD-10-CM

## 2021-09-09 DIAGNOSIS — J9621 Acute and chronic respiratory failure with hypoxia: Secondary | ICD-10-CM

## 2021-09-09 LAB — BASIC METABOLIC PANEL
Anion gap: 9 (ref 5–15)
BUN: 21 mg/dL (ref 8–23)
CO2: 28 mmol/L (ref 22–32)
Calcium: 8.9 mg/dL (ref 8.9–10.3)
Chloride: 98 mmol/L (ref 98–111)
Creatinine, Ser: 0.54 mg/dL — ABNORMAL LOW (ref 0.61–1.24)
GFR, Estimated: >60 mL/min (ref >60)
Glucose, Bld: 119 mg/dL — ABNORMAL HIGH (ref 70–99)
Potassium: 3.8 mmol/L (ref 3.5–5.1)
Sodium: 135 mmol/L (ref 135–145)

## 2021-09-09 LAB — BLOOD GAS, ARTERIAL
Acid-Base Excess: 4.1 mmol/L — ABNORMAL HIGH (ref 0.0–2.0)
Acid-Base Excess: 6 mmol/L — ABNORMAL HIGH (ref 0.0–2.0)
Bicarbonate: 29.3 mmol/L — ABNORMAL HIGH (ref 20.0–28.0)
Bicarbonate: 30.5 mmol/L — ABNORMAL HIGH (ref 20.0–28.0)
FIO2: 35
FIO2: 35
O2 Saturation: 86.8 %
O2 Saturation: 93.7 %
Patient temperature: 36.6
Patient temperature: 36.5
pCO2 arterial: 53.1 mmHg — ABNORMAL HIGH (ref 32.0–48.0)
pCO2 arterial: 47.6 mmHg (ref 32.0–48.0)
pH, Arterial: 7.359 (ref 7.350–7.450)
pH, Arterial: 7.42 (ref 7.350–7.450)
pO2, Arterial: 53.9 mmHg — ABNORMAL LOW (ref 83.0–108.0)
pO2, Arterial: 65.4 mmHg — ABNORMAL LOW (ref 83.0–108.0)

## 2021-09-09 LAB — CBC
HCT: 32.6 % — ABNORMAL LOW (ref 39.0–52.0)
Hemoglobin: 10.7 g/dL — ABNORMAL LOW (ref 13.0–17.0)
MCH: 32.3 pg (ref 26.0–34.0)
MCHC: 32.8 g/dL (ref 30.0–36.0)
MCV: 98.5 fL (ref 80.0–100.0)
Platelets: 196 10*3/uL (ref 150–400)
RBC: 3.31 MIL/uL — ABNORMAL LOW (ref 4.22–5.81)
RDW: 12.7 % (ref 11.5–15.5)
WBC: 8.7 10*3/uL (ref 4.0–10.5)
nRBC: 0 % (ref 0.0–0.2)

## 2021-09-09 NOTE — Progress Notes (Signed)
Pulmonary Critical Care Medicine [facility]   PULMONARY CRITICAL CARE SERVICE  PROGRESS NOTE     [name]  ZES:[id number]  DOB: [birth year]   DOA: 08/30/2021  Referring Physician: Luna Kitchens, MD  HPI: [name] is a 65 y.o. male being followed for ventilator/airway/oxygen weaning Acute on Chronic Respiratory Failure.  Patient is on pressure support has been on a pressure of 12/5 doing fairly well  Medications: Reviewed on Rounds  Physical Exam:  Vitals: Temperature is 98.3 pulse 80 respiratory is 20 blood pressure is 161/85 saturations are 94%  Ventilator Settings patient is on pressure support 12/5  General: Comfortable at this time Neck: supple Cardiovascular: no malignant arrhythmias Respiratory: No rhonchi very coarse breath sounds Skin: no rash seen on limited exam Musculoskeletal: No gross abnormality Psychiatric:unable to assess Neurologic:no involuntary movements         Lab Data:   Basic Metabolic Panel: Recent Labs  Lab 09/08/21 0550 09/08/21 0910 09/09/21 0435  NA 133*  --  135  K 5.5* 5.0 3.8  CL 98  --  98  CO2 28  --  28  GLUCOSE 123*  --  119*  BUN 22  --  21  CREATININE 0.73  --  0.54*  CALCIUM 9.4  --  8.9    ABG: Recent Labs  Lab 09/08/21 0413 09/08/21 0620  PHART 7.184* 7.283*  PCO2ART 85.5* 64.6*  PO2ART 92.3 217*  HCO3 31.1* 29.7*  O2SAT 96.0 99.9    Liver Function Tests: No results for input(s): AST, ALT, ALKPHOS, BILITOT, PROT, ALBUMIN in the last 168 hours. No results for input(s): LIPASE, AMYLASE in the last 168 hours. No results for input(s): AMMONIA in the last 168 hours.  CBC: Recent Labs  Lab 09/08/21 0550 09/09/21 0435  WBC 6.7 8.7  HGB 12.5* 10.7*  HCT 38.9* 32.6*  MCV 101.0* 98.5  PLT 187 196    Cardiac Enzymes: No results for input(s): CKTOTAL, CKMB, CKMBINDEX, TROPONINI in the last 168 hours.  BNP (last 3 results) No results for input(s): BNP in the last  8760 hours.  ProBNP (last 3 results) Recent Labs    01/29/21 0945  PROBNP 9.0    Radiological Exams: DG CHEST PORT 1 VIEW  Result Date: 09/08/2021 CLINICAL DATA:  Respiratory failure EXAM: PORTABLE CHEST 1 VIEW COMPARISON:  08/22/2021 FINDINGS: Cardiac shadow is stable. Tracheostomy tube and feeding catheter are noted in satisfactory position. Lungs are well aerated bilaterally. Right-sided pleural effusion is noted and stable. No new focal abnormality is noted. No bony abnormality is seen. IMPRESSION: Persistent right-sided pleural effusion stable from the prior exam. Tubes and lines as described. Electronically Signed   By: Alcide Clever M.D.   On: 09/08/2021 17:26    Assessment/Plan Active Problems:   Acute on chronic respiratory failure with hypoxia (HCC)   Traumatic fracture of ribs of right side with pneumothorax   History of alcohol abuse   Delirium due to another medical condition   Healthcare associated bacterial pneumonia   Acute on chronic respiratory failure with hypoxia we should be able to advance the weaning further proceed to T collar.  Patient looks good has not had any desaturations the airway will need to remain in place because of copious secretions Traumatic fracture on ribs supportive care physical therapy as tolerated no pneumothorax there was a small pleural effusion noted History of alcohol abuse no active withdrawal symptoms Delirium is more calm and comfortable Healthcare associated pneumonia has  been treated with antibiotics   I have personally seen and evaluated the patient, evaluated laboratory and imaging results, formulated the assessment and plan and placed orders. The Patient requires high complexity decision making with multiple systems involvement.  Rounds were done with the Respiratory Therapy Director and Staff therapists and discussed with nursing staff also.  Yevonne Pax, MD [facility] Pulmonary Critical Care Medicine Sleep Medicine

## 2021-09-10 LAB — BLOOD GAS, ARTERIAL
Acid-Base Excess: 5.8 mmol/L — ABNORMAL HIGH (ref 0.0–2.0)
Bicarbonate: 30.8 mmol/L — ABNORMAL HIGH (ref 20.0–28.0)
FIO2: 35
O2 Saturation: 95.2 %
Patient temperature: 36.9
pCO2 arterial: 53.7 mmHg — ABNORMAL HIGH (ref 32.0–48.0)
pH, Arterial: 7.377 (ref 7.350–7.450)
pO2, Arterial: 76.9 mmHg — ABNORMAL LOW (ref 83.0–108.0)

## 2021-09-10 NOTE — Progress Notes (Signed)
Pulmonary Critical Care Medicine [facility] [place]   PULMONARY CRITICAL CARE SERVICE  PROGRESS NOTE     Brayant Dorr Derby Brewer  YHC:[id number]  DOB: 04-01-1956   DOA: 08/30/2021  Referring Physician: Luna Kitchens, MD  HPI: [name] is a 65 y.o. male being followed for ventilator/airway/oxygen weaning Acute on Chronic Respiratory Failure.  Patient is comfortable without distress at this time is been on T collar on 35% FiO2  Medications: Reviewed on Rounds  Physical Exam:  Vitals: Temperature is 99.1 pulse 106 respiratory rate is 17 blood pressure is 167/95 saturations 95%  Ventilator Settings on T collar FiO2 of 35%  General: Comfortable at this time Neck: supple Cardiovascular: no malignant arrhythmias Respiratory: Scattered rhonchi expansion is equal Skin: no rash seen on limited exam Musculoskeletal: No gross abnormality Psychiatric:unable to assess Neurologic:no involuntary movements         Lab Data:   Basic Metabolic Panel: Recent Labs  Lab 09/08/21 0550 09/08/21 0910 09/09/21 0435  NA 133*  --  135  K 5.5* 5.0 3.8  CL 98  --  98  CO2 28  --  28  GLUCOSE 123*  --  119*  BUN 22  --  21  CREATININE 0.73  --  0.54*  CALCIUM 9.4  --  8.9    ABG: Recent Labs  Lab 09/08/21 0413 09/08/21 0620 09/09/21 1523 09/09/21 1745 09/10/21 0656  PHART 7.184* 7.283* 7.359 7.420 7.377  PCO2ART 85.5* 64.6* 53.1* 47.6 53.7*  PO2ART 92.3 217* 53.9* 65.4* 76.9*  HCO3 31.1* 29.7* 29.3* 30.5* 30.8*  O2SAT 96.0 99.9 86.8 93.7 95.2    Liver Function Tests: No results for input(s): AST, ALT, ALKPHOS, BILITOT, PROT, ALBUMIN in the last 168 hours. No results for input(s): LIPASE, AMYLASE in the last 168 hours. No results for input(s): AMMONIA in the last 168 hours.  CBC: Recent Labs  Lab 09/08/21 0550 09/09/21 0435  WBC 6.7 8.7  HGB 12.5* 10.7*  HCT 38.9* 32.6*  MCV 101.0* 98.5  PLT 187 196    Cardiac Enzymes: No results for input(s):  CKTOTAL, CKMB, CKMBINDEX, TROPONINI in the last 168 hours.  BNP (last 3 results) No results for input(s): BNP in the last 8760 hours.  ProBNP (last 3 results) Recent Labs    01/29/21 0945  PROBNP 9.0    Radiological Exams: DG CHEST PORT 1 VIEW  Result Date: 09/08/2021 CLINICAL DATA:  Respiratory failure EXAM: PORTABLE CHEST 1 VIEW COMPARISON:  08/22/2021 FINDINGS: Cardiac shadow is stable. Tracheostomy tube and feeding catheter are noted in satisfactory position. Lungs are well aerated bilaterally. Right-sided pleural effusion is noted and stable. No new focal abnormality is noted. No bony abnormality is seen. IMPRESSION: Persistent right-sided pleural effusion stable from the prior exam. Tubes and lines as described. Electronically Signed   By: Alcide Clever M.D.   On: 09/08/2021 17:26    Assessment/Plan Active Problems:   Acute on chronic respiratory failure with hypoxia (HCC)   Traumatic fracture of ribs of right side with pneumothorax   History of alcohol abuse   Delirium due to another medical condition   Healthcare associated bacterial pneumonia   Acute on chronic respiratory failure hypoxia we will continue to wean on T collar patient's secretions are still copious Traumatic fracture ribs with pneumothorax supportive care monitoring x-rays History of alcohol abuse no active withdrawal Delirium patient is at baseline Healthcare associated pneumonia has been treated with antibiotics   I have personally seen and evaluated the  patient, evaluated laboratory and imaging results, formulated the assessment and plan and placed orders. The Patient requires high complexity decision making with multiple systems involvement.  Rounds were done with the Respiratory Therapy Director and Staff therapists and discussed with nursing staff also.  Allyne Gee, MD [facility] Pulmonary Critical Care Medicine Sleep Medicine

## 2021-09-11 ENCOUNTER — Other Ambulatory Visit (HOSPITAL_COMMUNITY): Payer: Self-pay

## 2021-09-11 LAB — CBC
HCT: 31.5 % — ABNORMAL LOW (ref 39.0–52.0)
Hemoglobin: 10.1 g/dL — ABNORMAL LOW (ref 13.0–17.0)
MCH: 31.8 pg (ref 26.0–34.0)
MCHC: 32.1 g/dL (ref 30.0–36.0)
MCV: 99.1 fL (ref 80.0–100.0)
Platelets: 176 10*3/uL (ref 150–400)
RBC: 3.18 MIL/uL — ABNORMAL LOW (ref 4.22–5.81)
RDW: 12.7 % (ref 11.5–15.5)
WBC: 5.9 10*3/uL (ref 4.0–10.5)
nRBC: 0 % (ref 0.0–0.2)

## 2021-09-11 NOTE — Progress Notes (Signed)
Pulmonary Critical Care Medicine [facility] [place]   PULMONARY CRITICAL CARE SERVICE  PROGRESS NOTE     [name]  ZHY:[id number]  DOB: April 09, 1956   DOA: 08/30/2021  Referring Physician: Luna Kitchens, MD  HPI: [name] is a 65 y.o. male being followed for ventilator/airway/oxygen weaning Acute on Chronic Respiratory Failure.  Patient is on T collar secretions are copious  Medications: Reviewed on Rounds  Physical Exam:  Vitals: Temperature is 97.1 pulse 91 respiratory is 28 blood pressure is 134/80 saturations 98%  Ventilator Settings on T collar at this time  General: Comfortable at this time Neck: supple Cardiovascular: no malignant arrhythmias Respiratory: Scattered rhonchi expansion is equal Skin: no rash seen on limited exam Musculoskeletal: No gross abnormality Psychiatric:unable to assess Neurologic:no involuntary movements         Lab Data:   Basic Metabolic Panel: Recent Labs  Lab 09/08/21 0550 09/08/21 0910 09/09/21 0435  NA 133*  --  135  K 5.5* 5.0 3.8  CL 98  --  98  CO2 28  --  28  GLUCOSE 123*  --  119*  BUN 22  --  21  CREATININE 0.73  --  0.54*  CALCIUM 9.4  --  8.9    ABG: Recent Labs  Lab 09/08/21 0413 09/08/21 0620 09/09/21 1523 09/09/21 1745 09/10/21 0656  PHART 7.184* 7.283* 7.359 7.420 7.377  PCO2ART 85.5* 64.6* 53.1* 47.6 53.7*  PO2ART 92.3 217* 53.9* 65.4* 76.9*  HCO3 31.1* 29.7* 29.3* 30.5* 30.8*  O2SAT 96.0 99.9 86.8 93.7 95.2    Liver Function Tests: No results for input(s): AST, ALT, ALKPHOS, BILITOT, PROT, ALBUMIN in the last 168 hours. No results for input(s): LIPASE, AMYLASE in the last 168 hours. No results for input(s): AMMONIA in the last 168 hours.  CBC: Recent Labs  Lab 09/08/21 0550 09/09/21 0435 09/11/21 0503  WBC 6.7 8.7 5.9  HGB 12.5* 10.7* 10.1*  HCT 38.9* 32.6* 31.5*  MCV 101.0* 98.5 99.1  PLT 187 196 176    Cardiac Enzymes: No results for input(s):  CKTOTAL, CKMB, CKMBINDEX, TROPONINI in the last 168 hours.  BNP (last 3 results) No results for input(s): BNP in the last 8760 hours.  ProBNP (last 3 results) Recent Labs    01/29/21 0945  PROBNP 9.0    Radiological Exams: No results found.  Assessment/Plan Active Problems:   Acute on chronic respiratory failure with hypoxia (HCC)   Traumatic fracture of ribs of right side with pneumothorax   History of alcohol abuse   Delirium due to another medical condition   Healthcare associated bacterial pneumonia   Acute on chronic respiratory failure hypoxia plan is going to be continue with T collar titrate oxygen as tolerated continue pulmonary toilet. Right-sided pneumothorax as a result of traumatic chest injury History of alcohol abuse supportive care Delirium again we will continue to monitor Healthcare associated pneumonia has been treated with antibiotics suggested follow-up chest x-ray done due to persistence of pleural effusion and a CT scan   I have personally seen and evaluated the patient, evaluated laboratory and imaging results, formulated the assessment and plan and placed orders. The Patient requires high complexity decision making with multiple systems involvement.  Rounds were done with the Respiratory Therapy Director and Staff therapists and discussed with nursing staff also.  Yevonne Pax, MD [facility] Pulmonary Critical Care Medicine Sleep Medicine

## 2021-09-12 LAB — BASIC METABOLIC PANEL
Anion gap: 7 (ref 5–15)
BUN: 18 mg/dL (ref 8–23)
CO2: 32 mmol/L (ref 22–32)
Calcium: 9.3 mg/dL (ref 8.9–10.3)
Chloride: 99 mmol/L (ref 98–111)
Creatinine, Ser: 0.59 mg/dL — ABNORMAL LOW (ref 0.61–1.24)
GFR, Estimated: >60 mL/min (ref >60)
Glucose, Bld: 121 mg/dL — ABNORMAL HIGH (ref 70–99)
Potassium: 3.8 mmol/L (ref 3.5–5.1)
Sodium: 138 mmol/L (ref 135–145)

## 2021-09-12 LAB — CBC
HCT: 31.9 % — ABNORMAL LOW (ref 39.0–52.0)
Hemoglobin: 10.1 g/dL — ABNORMAL LOW (ref 13.0–17.0)
MCH: 31.5 pg (ref 26.0–34.0)
MCHC: 31.7 g/dL (ref 30.0–36.0)
MCV: 99.4 fL (ref 80.0–100.0)
Platelets: 214 10*3/uL (ref 150–400)
RBC: 3.21 MIL/uL — ABNORMAL LOW (ref 4.22–5.81)
RDW: 12.7 % (ref 11.5–15.5)
WBC: 6 10*3/uL (ref 4.0–10.5)
nRBC: 0 % (ref 0.0–0.2)

## 2021-09-12 LAB — CULTURE, RESPIRATORY W GRAM STAIN: Culture: NORMAL

## 2021-09-12 LAB — MAGNESIUM: Magnesium: 2 mg/dL (ref 1.7–2.4)

## 2021-09-12 NOTE — Progress Notes (Signed)
Pulmonary Critical Care Medicine [facility] [place]   PULMONARY CRITICAL CARE SERVICE  PROGRESS NOTE     [name]  QPY:[id number]  DOB: [birth year]   DOA: 08/30/2021  Referring Physician: Luna Kitchens, MD  HPI: [name] is a 65 y.o. male being followed for ventilator/airway/oxygen weaning Acute on Chronic Respiratory Failure.  Patient is on T collar currently on 35% FiO2 secretions are copious  Medications: Reviewed on Rounds  Physical Exam:  Vitals: Temperature is 96.1 pulse 92 respiratory rate is 18 blood pressure is 112/80 saturations 95%  Ventilator Settings off the ventilator on T collar FiO2 is 35%  General: Comfortable at this time Neck: supple Cardiovascular: no malignant arrhythmias Respiratory: No rhonchi very coarse breath sounds Skin: no rash seen on limited exam Musculoskeletal: No gross abnormality Psychiatric:unable to assess Neurologic:no involuntary movements         Lab Data:   Basic Metabolic Panel: Recent Labs  Lab 09/08/21 0550 09/08/21 0910 09/09/21 0435  NA 133*  --  135  K 5.5* 5.0 3.8  CL 98  --  98  CO2 28  --  28  GLUCOSE 123*  --  119*  BUN 22  --  21  CREATININE 0.73  --  0.54*  CALCIUM 9.4  --  8.9    ABG: Recent Labs  Lab 09/08/21 0413 09/08/21 0620 09/09/21 1523 09/09/21 1745 09/10/21 0656  PHART 7.184* 7.283* 7.359 7.420 7.377  PCO2ART 85.5* 64.6* 53.1* 47.6 53.7*  PO2ART 92.3 217* 53.9* 65.4* 76.9*  HCO3 31.1* 29.7* 29.3* 30.5* 30.8*  O2SAT 96.0 99.9 86.8 93.7 95.2    Liver Function Tests: No results for input(s): AST, ALT, ALKPHOS, BILITOT, PROT, ALBUMIN in the last 168 hours. No results for input(s): LIPASE, AMYLASE in the last 168 hours. No results for input(s): AMMONIA in the last 168 hours.  CBC: Recent Labs  Lab 09/08/21 0550 09/09/21 0435 09/11/21 0503  WBC 6.7 8.7 5.9  HGB 12.5* 10.7* 10.1*  HCT 38.9* 32.6* 31.5*  MCV 101.0* 98.5 99.1  PLT 187 196 176     Cardiac Enzymes: No results for input(s): CKTOTAL, CKMB, CKMBINDEX, TROPONINI in the last 168 hours.  BNP (last 3 results) No results for input(s): BNP in the last 8760 hours.  ProBNP (last 3 results) Recent Labs    01/29/21 0945  PROBNP 9.0    Radiological Exams: CT CHEST WO CONTRAST  Result Date: 09/11/2021 CLINICAL DATA:  Pleural effusion EXAM: CT CHEST WITHOUT CONTRAST TECHNIQUE: Multidetector CT imaging of the chest was performed following the standard protocol without IV contrast. COMPARISON:  CT chest abdomen pelvis, 07/27/2021 FINDINGS: Cardiovascular: No significant vascular findings. Normal heart size. Left coronary artery calcifications. No pericardial effusion. Mediastinum/Nodes: No enlarged mediastinal, hilar, or axillary lymph nodes. Thyroid gland, trachea, and esophagus demonstrate no significant findings. Lungs/Pleura: Tracheostomy. Moderate centrilobular emphysema. Diffuse bilateral bronchial wall thickening. Scattered bronchiolar plugging. Moderate, loculated appearing right pleural effusion with associated atelectasis or consolidation. No pleural effusion or pneumothorax. Upper Abdomen: No acute abnormality. Musculoskeletal: No chest wall mass or suspicious bone lesions identified. Numerous partially callused, subacute appearing fractures of the posterior and lateral right ribs. Nonacute wedge deformities of T3 and T4. IMPRESSION: 1. Moderate, loculated appearing right pleural effusion with associated atelectasis or consolidation. 2. Numerous partially callused, subacute appearing fractures of the posterior and lateral right ribs, seen acutely on examination dated 07/27/2021. 3. Emphysema and diffuse bilateral bronchial wall thickening. 4. Coronary artery disease. Emphysema (ICD10-J43.9). Electronically Signed  By: Jearld Lesch M.D.   On: 09/11/2021 14:16    Assessment/Plan Active Problems:   Acute on chronic respiratory failure with hypoxia (HCC)   Traumatic fracture  of ribs of right side with pneumothorax   History of alcohol abuse   Delirium due to another medical condition   Healthcare associated bacterial pneumonia   Acute on chronic respiratory failure hypoxia patient is on T collar 35% FiO2 secretions are copious.  Continue with the T-piece titrate oxygen as tolerated continue pulmonary toilet History of alcohol abuse no withdrawal noted Delirium patient's appears to be at the baseline now Healthcare associated pneumonia has been treated with antibiotics we will continue to monitor Pneumothorax resolved treated   I have personally seen and evaluated the patient, evaluated laboratory and imaging results, formulated the assessment and plan and placed orders. The Patient requires high complexity decision making with multiple systems involvement.  Rounds were done with the Respiratory Therapy Director and Staff therapists and discussed with nursing staff also.  Yevonne Pax, MD [facility] Pulmonary Critical Care Medicine Sleep Medicine

## 2021-09-13 LAB — URINE CULTURE: Culture: >=100000 — AB

## 2021-09-13 NOTE — Progress Notes (Signed)
Pulmonary Critical Care Medicine [facility] [place]   PULMONARY CRITICAL CARE SERVICE  PROGRESS NOTE     [name]  EML:[id number]  DOB: 1956/01/04   DOA: 08/30/2021  Referring Physician: Luna Kitchens, MD  HPI: [name] is a 65 y.o. male being followed for ventilator/airway/oxygen weaning Acute on Chronic Respiratory Failure.  Patient is currently on T collar has been on 35% FiO2 secretions are still copious  Medications: Reviewed on Rounds  Physical Exam:  Vitals: Temperature is 98.7 pulse 90 respiratory 16 blood pressure is 131/80 saturations 94%  Ventilator Settings on T collar with FiO2 of 35%  General: Comfortable at this time Neck: supple Cardiovascular: no malignant arrhythmias Respiratory: Coarse scattered rhonchi noted bilaterally Skin: no rash seen on limited exam Musculoskeletal: No gross abnormality Psychiatric:unable to assess Neurologic:no involuntary movements         Lab Data:   Basic Metabolic Panel: Recent Labs  Lab 09/08/21 0550 09/08/21 0910 09/09/21 0435 09/12/21 0730  NA 133*  --  135 138  K 5.5* 5.0 3.8 3.8  CL 98  --  98 99  CO2 28  --  28 32  GLUCOSE 123*  --  119* 121*  BUN 22  --  21 18  CREATININE 0.73  --  0.54* 0.59*  CALCIUM 9.4  --  8.9 9.3  MG  --   --   --  2.0    ABG: Recent Labs  Lab 09/08/21 0413 09/08/21 0620 09/09/21 1523 09/09/21 1745 09/10/21 0656  PHART 7.184* 7.283* 7.359 7.420 7.377  PCO2ART 85.5* 64.6* 53.1* 47.6 53.7*  PO2ART 92.3 217* 53.9* 65.4* 76.9*  HCO3 31.1* 29.7* 29.3* 30.5* 30.8*  O2SAT 96.0 99.9 86.8 93.7 95.2    Liver Function Tests: No results for input(s): AST, ALT, ALKPHOS, BILITOT, PROT, ALBUMIN in the last 168 hours. No results for input(s): LIPASE, AMYLASE in the last 168 hours. No results for input(s): AMMONIA in the last 168 hours.  CBC: Recent Labs  Lab 09/08/21 0550 09/09/21 0435 09/11/21 0503 09/12/21 0730  WBC 6.7 8.7 5.9 6.0  HGB  12.5* 10.7* 10.1* 10.1*  HCT 38.9* 32.6* 31.5* 31.9*  MCV 101.0* 98.5 99.1 99.4  PLT 187 196 176 214    Cardiac Enzymes: No results for input(s): CKTOTAL, CKMB, CKMBINDEX, TROPONINI in the last 168 hours.  BNP (last 3 results) No results for input(s): BNP in the last 8760 hours.  ProBNP (last 3 results) Recent Labs    01/29/21 0945  PROBNP 9.0    Radiological Exams: CT CHEST WO CONTRAST  Result Date: 09/11/2021 CLINICAL DATA:  Pleural effusion EXAM: CT CHEST WITHOUT CONTRAST TECHNIQUE: Multidetector CT imaging of the chest was performed following the standard protocol without IV contrast. COMPARISON:  CT chest abdomen pelvis, 07/27/2021 FINDINGS: Cardiovascular: No significant vascular findings. Normal heart size. Left coronary artery calcifications. No pericardial effusion. Mediastinum/Nodes: No enlarged mediastinal, hilar, or axillary lymph nodes. Thyroid gland, trachea, and esophagus demonstrate no significant findings. Lungs/Pleura: Tracheostomy. Moderate centrilobular emphysema. Diffuse bilateral bronchial wall thickening. Scattered bronchiolar plugging. Moderate, loculated appearing right pleural effusion with associated atelectasis or consolidation. No pleural effusion or pneumothorax. Upper Abdomen: No acute abnormality. Musculoskeletal: No chest wall mass or suspicious bone lesions identified. Numerous partially callused, subacute appearing fractures of the posterior and lateral right ribs. Nonacute wedge deformities of T3 and T4. IMPRESSION: 1. Moderate, loculated appearing right pleural effusion with associated atelectasis or consolidation. 2. Numerous partially callused, subacute appearing fractures of the  posterior and lateral right ribs, seen acutely on examination dated 07/27/2021. 3. Emphysema and diffuse bilateral bronchial wall thickening. 4. Coronary artery disease. Emphysema (ICD10-J43.9). Electronically Signed   By: Jearld Lesch M.D.   On: 09/11/2021 14:16     Assessment/Plan Active Problems:   Acute on chronic respiratory failure with hypoxia (HCC)   Traumatic fracture of ribs of right side with pneumothorax   History of alcohol abuse   Delirium due to another medical condition   Healthcare associated bacterial pneumonia   Acute on chronic respiratory failure with hypoxia plan is going to be to continue with the T collar patient has been on the 35% FiO2 with good saturations Traumatic rib fracture pneumothorax resolved hemodynamics are stable History of alcohol abuse no active withdrawal Delirium patient appears to be in baseline Healthcare associated pneumonia has been treated moderate loculated pleural effusion is noted still probably not enough to tap   I have personally seen and evaluated the patient, evaluated laboratory and imaging results, formulated the assessment and plan and placed orders. The Patient requires high complexity decision making with multiple systems involvement.  Rounds were done with the Respiratory Therapy Director and Staff therapists and discussed with nursing staff also.  Yevonne Pax, MD [facility] Pulmonary Critical Care Medicine Sleep Medicine

## 2021-09-14 ENCOUNTER — Other Ambulatory Visit (HOSPITAL_COMMUNITY): Payer: Self-pay

## 2021-09-14 DIAGNOSIS — J90 Pleural effusion, not elsewhere classified: Secondary | ICD-10-CM

## 2021-09-14 HISTORY — PX: IR THORACENTESIS RIGHT ASP PLEURAL SPACE W/IMG GUIDE: IMG5380

## 2021-09-14 LAB — CBC
HCT: 31 % — ABNORMAL LOW (ref 39.0–52.0)
Hemoglobin: 10.2 g/dL — ABNORMAL LOW (ref 13.0–17.0)
MCH: 32 pg (ref 26.0–34.0)
MCHC: 32.9 g/dL (ref 30.0–36.0)
MCV: 97.2 fL (ref 80.0–100.0)
Platelets: 198 10*3/uL (ref 150–400)
RBC: 3.19 MIL/uL — ABNORMAL LOW (ref 4.22–5.81)
RDW: 12.5 % (ref 11.5–15.5)
WBC: 5.2 10*3/uL (ref 4.0–10.5)
nRBC: 0 % (ref 0.0–0.2)

## 2021-09-14 MED ORDER — LIDOCAINE HCL (PF) 1 % IJ SOLN
INTRAMUSCULAR | Status: DC | PRN
  Administered 2021-09-14: 10 mL via SUBCUTANEOUS

## 2021-09-14 MED ORDER — LIDOCAINE HCL 1 % IJ SOLN
INTRAMUSCULAR | Status: AC
  Filled 2021-09-14: qty 20

## 2021-09-14 NOTE — Procedures (Signed)
PROCEDURE SUMMARY:  Successful US guided therapeutic right thoracentesis. Yielded 1.2 L of hazy, rust colored fluid. Pt tolerated procedure well. No immediate complications.  Specimen not sent for labs. CXR ordered.  EBL < 1 mL  Shon Hough, AGNP 09/14/2021 11:53 AM

## 2021-09-14 NOTE — Progress Notes (Signed)
Pulmonary Critical Care Medicine [facility] [place]   PULMONARY CRITICAL CARE SERVICE  PROGRESS NOTE     Aj [place]leton [place] Brewer  TIR:[id number]  DOB: [birth year]   DOA: 08/30/2021  Referring Physician: Luna Kitchens, MD  HPI: [name] is a 65 y.o. male being followed for ventilator/airway/oxygen weaning Acute on Chronic Respiratory Failure.  Patient is currently on T collar has been on 35% FiO2 secretions are very copious.  CT scan reveals presence of pleural effusions may benefit from thoracentesis  Medications: Reviewed on Rounds  Physical Exam:  Vitals: Temperature is 97.8 pulse 88 respiratory rate 16 blood pressure is 116/68 saturations 96%  Ventilator Settings off the ventilator on T collar currently on 35% FiO2  General: Comfortable at this time Neck: supple Cardiovascular: no malignant arrhythmias Respiratory: Coarse rhonchi are noted bilaterally Skin: no rash seen on limited exam Musculoskeletal: No gross abnormality Psychiatric:unable to assess Neurologic:no involuntary movements         Lab Data:   Basic Metabolic Panel: Recent Labs  Lab 09/08/21 0550 09/08/21 0910 09/09/21 0435 09/12/21 0730  NA 133*  --  135 138  K 5.5* 5.0 3.8 3.8  CL 98  --  98 99  CO2 28  --  28 32  GLUCOSE 123*  --  119* 121*  BUN 22  --  21 18  CREATININE 0.73  --  0.54* 0.59*  CALCIUM 9.4  --  8.9 9.3  MG  --   --   --  2.0    ABG: Recent Labs  Lab 09/08/21 0413 09/08/21 0620 09/09/21 1523 09/09/21 1745 09/10/21 0656  PHART 7.184* 7.283* 7.359 7.420 7.377  PCO2ART 85.5* 64.6* 53.1* 47.6 53.7*  PO2ART 92.3 217* 53.9* 65.4* 76.9*  HCO3 31.1* 29.7* 29.3* 30.5* 30.8*  O2SAT 96.0 99.9 86.8 93.7 95.2    Liver Function Tests: No results for input(s): AST, ALT, ALKPHOS, BILITOT, PROT, ALBUMIN in the last 168 hours. No results for input(s): LIPASE, AMYLASE in the last 168 hours. No results for input(s): AMMONIA in the last 168 hours.  CBC: Recent  Labs  Lab 09/08/21 0550 09/09/21 0435 09/11/21 0503 09/12/21 0730 09/14/21 0510  WBC 6.7 8.7 5.9 6.0 5.2  HGB 12.5* 10.7* 10.1* 10.1* 10.2*  HCT 38.9* 32.6* 31.5* 31.9* 31.0*  MCV 101.0* 98.5 99.1 99.4 97.2  PLT 187 196 176 214 198    Cardiac Enzymes: No results for input(s): CKTOTAL, CKMB, CKMBINDEX, TROPONINI in the last 168 hours.  BNP (last 3 results) No results for input(s): BNP in the last 8760 hours.  ProBNP (last 3 results) Recent Labs    01/29/21 0945  PROBNP 9.0    Radiological Exams: No results found.  Assessment/Plan Active Problems:   Acute on chronic respiratory failure with hypoxia (HCC)   Traumatic fracture of ribs of right side with pneumothorax   History of alcohol abuse   Delirium due to another medical condition   Healthcare associated bacterial pneumonia   Acute on chronic respiratory failure hypoxia patient had been on T collar and continues to be on the T collar because of excessive secretions not able to do capping.  In addition had CT scan which showed some loculation of the pleural fluid.  Patient had trauma to the right side and has an effusion on the right side could represent accumulated blood would have IR take a look and see if there is anything that can be tapped History of alcohol abuse no sign of active withdrawal  at this time Traumatic rib fractures seem to be healing on the CT scan but patient does have a effusion on the right side Delirium no change we will continue to monitor closely Healthcare associated pneumonia supportive care   I have personally seen and evaluated the patient, evaluated laboratory and imaging results, formulated the assessment and plan and placed orders. The Patient requires high complexity decision making with multiple systems involvement.  Rounds were done with the Respiratory Therapy Director and Staff therapists and discussed with nursing staff also.  Yevonne Pax, MD [facility] Pulmonary Critical Care  Medicine Sleep Medicine

## 2021-09-15 NOTE — Progress Notes (Signed)
Pulmonary Critical Care Medicine [facility] [place]   PULMONARY CRITICAL CARE SERVICE  PROGRESS NOTE     [name]  WER:[id number]  DOB: May 25, 1956   DOA: 08/30/2021  Referring Physician: Luna Kitchens, MD  HPI: [name] is a 65 y.o. male being followed for ventilator/airway/oxygen weaning Acute on Chronic Respiratory Failure.  Patient is on T collar currently on 50% FiO2 secretions are very copious.  Patient did undergo thoracentesis with removal of 1.2 L of fluid  Medications: Reviewed on Rounds  Physical Exam:  Vitals: Temperature is 97.0 pulse 83 respiratory 16 blood pressure is 100/71 saturations 96%  Ventilator Settings on T collar FiO2 50%  General: Comfortable at this time Neck: supple Cardiovascular: no malignant arrhythmias Respiratory: No rhonchi very coarse breath sounds Skin: no rash seen on limited exam Musculoskeletal: No gross abnormality Psychiatric:unable to assess Neurologic:no involuntary movements         Lab Data:   Basic Metabolic Panel: Recent Labs  Lab 09/08/21 0910 09/09/21 0435 09/12/21 0730  NA  --  135 138  K 5.0 3.8 3.8  CL  --  98 99  CO2  --  28 32  GLUCOSE  --  119* 121*  BUN  --  21 18  CREATININE  --  0.54* 0.59*  CALCIUM  --  8.9 9.3  MG  --   --  2.0    ABG: Recent Labs  Lab 09/09/21 1523 09/09/21 1745 09/10/21 0656  PHART 7.359 7.420 7.377  PCO2ART 53.1* 47.6 53.7*  PO2ART 53.9* 65.4* 76.9*  HCO3 29.3* 30.5* 30.8*  O2SAT 86.8 93.7 95.2    Liver Function Tests: No results for input(s): AST, ALT, ALKPHOS, BILITOT, PROT, ALBUMIN in the last 168 hours. No results for input(s): LIPASE, AMYLASE in the last 168 hours. No results for input(s): AMMONIA in the last 168 hours.  CBC: Recent Labs  Lab 09/09/21 0435 09/11/21 0503 09/12/21 0730 09/14/21 0510  WBC 8.7 5.9 6.0 5.2  HGB 10.7* 10.1* 10.1* 10.2*  HCT 32.6* 31.5* 31.9* 31.0*  MCV 98.5 99.1 99.4 97.2  PLT 196 176 214 198     Cardiac Enzymes: No results for input(s): CKTOTAL, CKMB, CKMBINDEX, TROPONINI in the last 168 hours.  BNP (last 3 results) No results for input(s): BNP in the last 8760 hours.  ProBNP (last 3 results) Recent Labs    01/29/21 0945  PROBNP 9.0    Radiological Exams: DG Chest 1 View  Result Date: 09/14/2021 CLINICAL DATA:  Status post right thoracentesis EXAM: CHEST  1 VIEW COMPARISON:  09/08/2021 chest radiograph. FINDINGS: Tracheostomy tube terminates over the tracheal air column at the thoracic inlet. Partially visualized left total shoulder arthroplasty. Stable cardiomediastinal silhouette with normal heart size. No pneumothorax. No residual pleural effusions. No overt pulmonary edema. Mild right lung base atelectasis, improved. Clear left lung. IMPRESSION: No pneumothorax. No residual pleural effusions. Mild right lung base atelectasis, improved. Electronically Signed   By: Delbert Phenix M.D.   On: 09/14/2021 12:08   IR THORACENTESIS ASP PLEURAL SPACE W/IMG GUIDE  Result Date: 09/14/2021 INDICATION: Patient admitted to select specialty post traumatic fall with multiple rib fractures, respiratory failure and pneumothorax. Request for therapeutic thoracentesis. EXAM: ULTRASOUND GUIDED THERAPEUTIC THORACENTESIS MEDICATIONS: 10 mL 1 % lidocaine COMPLICATIONS: None immediate. PROCEDURE: An ultrasound guided thoracentesis was thoroughly discussed with the patient and questions answered. The benefits, risks, alternatives and complications were also discussed. The patient understands and wishes to proceed with the procedure. Written  consent was obtained. Ultrasound was performed to localize and mark an adequate pocket of fluid in the right chest. The area was then prepped and draped in the normal sterile fashion. 1% Lidocaine was used for local anesthesia. Under ultrasound guidance a 6 Fr Safe-T-Centesis catheter was introduced. Thoracentesis was performed. The catheter was removed and a  dressing applied. FINDINGS: A total of approximately 1.2 L of hazy, rust colored fluid was removed. IMPRESSION: Successful ultrasound guided right thoracentesis yielding 1.2 L of pleural fluid. Read by: Alex Gardener, AGNP-BC Electronically Signed   By: Acquanetta Belling M.D.   On: 09/14/2021 12:11    Assessment/Plan Active Problems:   Acute on chronic respiratory failure with hypoxia (HCC)   Traumatic fracture of ribs of right side with pneumothorax   History of alcohol abuse   Delirium due to another medical condition   Healthcare associated bacterial pneumonia   Acute on chronic respiratory failure with hypoxia we will continue with T-piece titrate the oxygen as tolerated continue secretion management pulmonary toilet History of alcohol abuse supportive care we will continue to follow along. Traumatic pneumothorax doing okay at this point thoracentesis done likely old blood Delirium no change Healthcare associated pneumonia has been treated with antibiotics   I have personally seen and evaluated the patient, evaluated laboratory and imaging results, formulated the assessment and plan and placed orders. The Patient requires high complexity decision making with multiple systems involvement.  Rounds were done with the Respiratory Therapy Director and Staff therapists and discussed with nursing staff also.  Yevonne Pax, MD [facility] Pulmonary Critical Care Medicine Sleep Medicine

## 2021-09-16 LAB — CBC
HCT: 34.7 % — ABNORMAL LOW (ref 39.0–52.0)
Hemoglobin: 11.1 g/dL — ABNORMAL LOW (ref 13.0–17.0)
MCH: 31.4 pg (ref 26.0–34.0)
MCHC: 32 g/dL (ref 30.0–36.0)
MCV: 98.3 fL (ref 80.0–100.0)
Platelets: 209 10*3/uL (ref 150–400)
RBC: 3.53 MIL/uL — ABNORMAL LOW (ref 4.22–5.81)
RDW: 12.5 % (ref 11.5–15.5)
WBC: 5.5 10*3/uL (ref 4.0–10.5)
nRBC: 0 % (ref 0.0–0.2)

## 2021-09-16 NOTE — Progress Notes (Signed)
Pulmonary Critical Care Medicine [facility] [place]   PULMONARY CRITICAL CARE SERVICE  PROGRESS NOTE     Chanc Kervin [place] III  BMW:[id number]  DOB: 1955-12-13   DOA: 08/30/2021  Referring Physician: Luna Kitchens, MD  HPI: Darrel Reach III is a 65 y.o. male being followed for ventilator/airway/oxygen weaning Acute on Chronic Respiratory Failure.  Patient is afebrile right now comfortable without distress has been on T collar on 40% FiO2 secretions are copious still  Medications: Reviewed on Rounds  Physical Exam:  Vitals: Temperature is 97.0 pulse 94 respiratory is 26 blood pressure is 118/69 saturations 95%  Ventilator Settings on T collar FiO2 is 40%  General: Comfortable at this time Neck: supple Cardiovascular: no malignant arrhythmias Respiratory: Coarse rhonchi expansion is equal Skin: no rash seen on limited exam Musculoskeletal: No gross abnormality Psychiatric:unable to assess Neurologic:no involuntary movements         Lab Data:   Basic Metabolic Panel: Recent Labs  Lab 09/12/21 0730  NA 138  K 3.8  CL 99  CO2 32  GLUCOSE 121*  BUN 18  CREATININE 0.59*  CALCIUM 9.3  MG 2.0    ABG: Recent Labs  Lab 09/09/21 1523 09/09/21 1745 09/10/21 0656  PHART 7.359 7.420 7.377  PCO2ART 53.1* 47.6 53.7*  PO2ART 53.9* 65.4* 76.9*  HCO3 29.3* 30.5* 30.8*  O2SAT 86.8 93.7 95.2    Liver Function Tests: No results for input(s): AST, ALT, ALKPHOS, BILITOT, PROT, ALBUMIN in the last 168 hours. No results for input(s): LIPASE, AMYLASE in the last 168 hours. No results for input(s): AMMONIA in the last 168 hours.  CBC: Recent Labs  Lab 09/11/21 0503 09/12/21 0730 09/14/21 0510 09/16/21 0727  WBC 5.9 6.0 5.2 5.5  HGB 10.1* 10.1* 10.2* 11.1*  HCT 31.5* 31.9* 31.0* 34.7*  MCV 99.1 99.4 97.2 98.3  PLT 176 214 198 209    Cardiac Enzymes: No results for input(s): CKTOTAL, CKMB, CKMBINDEX, TROPONINI in the last 168 hours.  BNP (last 3  results) No results for input(s): BNP in the last 8760 hours.  ProBNP (last 3 results) Recent Labs    01/29/21 0945  PROBNP 9.0    Radiological Exams: DG Chest 1 View  Result Date: 09/14/2021 CLINICAL DATA:  Status post right thoracentesis EXAM: CHEST  1 VIEW COMPARISON:  09/08/2021 chest radiograph. FINDINGS: Tracheostomy tube terminates over the tracheal air column at the thoracic inlet. Partially visualized left total shoulder arthroplasty. Stable cardiomediastinal silhouette with normal heart size. No pneumothorax. No residual pleural effusions. No overt pulmonary edema. Mild right lung base atelectasis, improved. Clear left lung. IMPRESSION: No pneumothorax. No residual pleural effusions. Mild right lung base atelectasis, improved. Electronically Signed   By: Delbert Phenix M.D.   On: 09/14/2021 12:08   IR THORACENTESIS ASP PLEURAL SPACE W/IMG GUIDE  Result Date: 09/14/2021 INDICATION: Patient admitted to select specialty post traumatic fall with multiple rib fractures, respiratory failure and pneumothorax. Request for therapeutic thoracentesis. EXAM: ULTRASOUND GUIDED THERAPEUTIC THORACENTESIS MEDICATIONS: 10 mL 1 % lidocaine COMPLICATIONS: None immediate. PROCEDURE: An ultrasound guided thoracentesis was thoroughly discussed with the patient and questions answered. The benefits, risks, alternatives and complications were also discussed. The patient understands and wishes to proceed with the procedure. Written consent was obtained. Ultrasound was performed to localize and mark an adequate pocket of fluid in the right chest. The area was then prepped and draped in the normal sterile fashion. 1% Lidocaine was used for local anesthesia. Under ultrasound guidance a 6 Fr  Safe-T-Centesis catheter was introduced. Thoracentesis was performed. The catheter was removed and a dressing applied. FINDINGS: A total of approximately 1.2 L of hazy, rust colored fluid was removed. IMPRESSION: Successful  ultrasound guided right thoracentesis yielding 1.2 L of pleural fluid. Read by: Alex Gardener, AGNP-BC Electronically Signed   By: Acquanetta Belling M.D.   On: 09/14/2021 12:11    Assessment/Plan Active Problems:   Acute on chronic respiratory failure with hypoxia (HCC)   Traumatic fracture of ribs of right side with pneumothorax   History of alcohol abuse   Delirium due to another medical condition   Healthcare associated bacterial pneumonia   Acute on chronic respiratory failure with hypoxia we will continue with the T-piece limiting factor for advancing Secondary to copious secretions requiring frequent suctioning Traumatic rib fractures supportive care status postthoracentesis History of alcohol abuse no active withdrawal is noted at this time Delirium we will continue with supportive care Healthcare associated pneumonia has been treated   I have personally seen and evaluated the patient, evaluated laboratory and imaging results, formulated the assessment and plan and placed orders. The Patient requires high complexity decision making with multiple systems involvement.  Rounds were done with the Respiratory Therapy Director and Staff therapists and discussed with nursing staff also.  Yevonne Pax, MD [facility] Pulmonary Critical Care Medicine Sleep Medicine

## 2021-09-17 NOTE — Progress Notes (Signed)
Pulmonary Critical Care Medicine [facility] [place]   PULMONARY CRITICAL CARE SERVICE  PROGRESS NOTE     [name] [place] Brewer  VQX:[id number]  DOB: [birth year]   DOA: 08/30/2021  Referring Physician: Luna Kitchens, MD  HPI: [name] is a 65 y.o. male being followed for ventilator/airway/oxygen weaning Acute on Chronic Respiratory Failure.  Patient is resting comfortably without distress on T collar secretions are copious.  Requiring very frequent suctioning.  Medications: Reviewed on Rounds  Physical Exam:  Vitals: Temperature is 97.9 pulse 90 respiratory 18 blood pressure is 129/70 saturations 95%  Ventilator Settings on T collar with an FiO2 of 40%  General: Comfortable at this time Neck: supple Cardiovascular: no malignant arrhythmias Respiratory: Scattered rhonchi expansion is equal Skin: no rash seen on limited exam Musculoskeletal: No gross abnormality Psychiatric:unable to assess Neurologic:no involuntary movements         Lab Data:   Basic Metabolic Panel: Recent Labs  Lab 09/12/21 0730  NA 138  K 3.8  CL 99  CO2 32  GLUCOSE 121*  BUN 18  CREATININE 0.59*  CALCIUM 9.3  MG 2.0    ABG: No results for input(s): PHART, PCO2ART, PO2ART, HCO3, O2SAT in the last 168 hours.  Liver Function Tests: No results for input(s): AST, ALT, ALKPHOS, BILITOT, PROT, ALBUMIN in the last 168 hours. No results for input(s): LIPASE, AMYLASE in the last 168 hours. No results for input(s): AMMONIA in the last 168 hours.  CBC: Recent Labs  Lab 09/11/21 0503 09/12/21 0730 09/14/21 0510 09/16/21 0727  WBC 5.9 6.0 5.2 5.5  HGB 10.1* 10.1* 10.2* 11.1*  HCT 31.5* 31.9* 31.0* 34.7*  MCV 99.1 99.4 97.2 98.3  PLT 176 214 198 209    Cardiac Enzymes: No results for input(s): CKTOTAL, CKMB, CKMBINDEX, TROPONINI in the last 168 hours.  BNP (last 3 results) No results for input(s): BNP in the last 8760 hours.  ProBNP (last 3 results) Recent Labs     01/29/21 0945  PROBNP 9.0    Radiological Exams: No results found.  Assessment/Plan Active Problems:   Acute on chronic respiratory failure with hypoxia (HCC)   Traumatic fracture of ribs of right side with pneumothorax   History of alcohol abuse   Delirium due to another medical condition   Healthcare associated bacterial pneumonia   Acute on chronic respiratory failure hypoxia patient is on T-piece on 40% FiO2 saturations are good.  We will continue with aggressive pulmonary toilet. Traumatic rib fracture with pneumothorax resolved we will continue to monitor closely History of alcohol abuse no change we will continue to monitor Delirium supportive care Healthcare associated pneumonia treated slow improvement   I have personally seen and evaluated the patient, evaluated laboratory and imaging results, formulated the assessment and plan and placed orders. The Patient requires high complexity decision making with multiple systems involvement.  Rounds were done with the Respiratory Therapy Director and Staff therapists and discussed with nursing staff also.  Yevonne Pax, MD [facility] Pulmonary Critical Care Medicine Sleep Medicine

## 2021-09-18 ENCOUNTER — Other Ambulatory Visit (HOSPITAL_COMMUNITY): Payer: Self-pay

## 2021-09-18 LAB — CBC
HCT: 33.1 % — ABNORMAL LOW (ref 39.0–52.0)
Hemoglobin: 10.4 g/dL — ABNORMAL LOW (ref 13.0–17.0)
MCH: 30.8 pg (ref 26.0–34.0)
MCHC: 31.4 g/dL (ref 30.0–36.0)
MCV: 97.9 fL (ref 80.0–100.0)
Platelets: 239 10*3/uL (ref 150–400)
RBC: 3.38 MIL/uL — ABNORMAL LOW (ref 4.22–5.81)
RDW: 12.5 % (ref 11.5–15.5)
WBC: 5.9 10*3/uL (ref 4.0–10.5)
nRBC: 0 % (ref 0.0–0.2)

## 2021-09-19 DIAGNOSIS — J90 Pleural effusion, not elsewhere classified: Secondary | ICD-10-CM

## 2021-09-19 NOTE — Progress Notes (Deleted)
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 Despite repetitive coaching he did not generate acceptable curves.  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 Mainly for sleep.   Can't take O2 to his very physical job. Saw NP 05/03/21- CPAP [facility]- not yet delivered Using CPAP from a friend. Prednisone taper for COPD exacerbation June, 2022.  Body weight today-230 lbs Covid vax-2 Moderna Flu vax- ------Pt states he SOB.      Arrival RA O2 sat today 96%. Using O2 2L for sleep and has adapter when he gets to pick up his new CPAP( 5-15) this week. Has been using one borrowed friend set on 10. No acute events. Does ok at work, but very DOE on stairs. Discussed portable O2 for use away from work> walk test today. Discussed CT showing atherosclerosis> cardiology referral for risk stratification. Girlfreind home after resection of brain tumor- probably met. Walk Test room air 06/21/21- 3 laps.O2 nadir 91%, Max HR 121.  CT chest low dose screen 02/04/21 IMPRESSION: 1. Lung-RADS 1, negative. Continue annual screening with low-dose chest CT without  contrast in 12 months. 2. Hepatic steatosis. 3. Coronary artery atherosclerosis. 4. Emphysema (ICD10-J43.9).  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 Mainly for sleep.   Can't take O2 to his very physical job. Saw NP 05/03/21- CPAP [facility]- not yet delivered ? Hosp 11/8- fell from ladder. Acute resp fail, R rib fxs, R ptx, pelvic fxs. Delirium.Tracheostomy 11/22, T-collar/ O2   ROS-see HPI + = positive Constitutional:   No-   weight loss, night sweats, fevers, chills, fatigue, lassitude. HEENT:   No-  headaches, difficulty swallowing, tooth/dental problems, sore throat,       No-  sneezing, itching, ear ache, nasal congestion, post nasal drip,  CV:  No-   chest pain, orthopnea, PND, swelling in lower extremities, anasarca,                   dizziness, palpitations Resp: +  shortness of breath with exertion or at rest.               productive cough,  + non-productive cough,  No- coughing up of blood.          change in color of mucus.  No- wheezing.  +snores Skin: No-   rash or lesions. GI:  No-   heartburn, indigestion, abdominal pain, nausea, vomiting,  GU:  MS:  No-   joint pain or swelling.  . Neuro-     nothing unusual Psych:  No- change in mood or affect. No depression or anxiety.  No memory loss.  OBJ- Physical Exam General- Alert, Oriented, Affect-appropriate,  Distress- none acute Skin-  Lymphadenopathy- none Head- atraumatic            Eyes- Gross vision intact, PERRLA, conjunctivae and secretions clear            Ears- Hearing, canals-normal            Nose- Clear, no-Septal dev, mucus, polyps, erosion, perforation             Throat- Mallampati III-IV , mucosa clear , drainage- none, tonsils- atrophic Neck-  flexible , trachea midline, no stridor , thyroid nl, carotid no bruit Chest - symmetrical excursion , unlabored           Heart/CV- RRR , no murmur , no gallop  , no rub, nl s1 s2                           - JVD- none , edema- none, stasis changes- none, varices- none           Lung-  distant, wheeze- none, cough+congested, dullness-none, rub- none           Chest wall-  Abd- tender-no, distended-no, bowel sounds-present, HSM- no Br/ Gen/ Rectal- Not done, not indicated Extrem- cyanosis- none, clubbing, none, atrophy- none, strength- nl,             Neuro-+ tremor hands and mild head-bob

## 2021-09-19 NOTE — Progress Notes (Signed)
Pulmonary Critical Care Medicine [place]  PROGRESS NOTE     Chez Channer [place] Brewer  [id number]  DOB: Jun 24, 1956   DOA: 08/30/2021  Referring Physician: Satira Sark, MD  HPI: [name] is a 66 y.o. male being followed for ventilator/airway/oxygen weaning Acute on Chronic Respiratory Failure.  Patient is on T collar currently on 35% FiO2 with good saturations  Medications: Reviewed on Rounds  Physical Exam:  Vitals: Temperature is 97.8 9 pulse 81 respiratory to 16 blood pressure is 118/72 saturations 95%  Ventilator Settings on T collar FiO2 is 35%  General: Comfortable at this time Neck: supple Cardiovascular: no malignant arrhythmias Respiratory: Scattered rhonchi expansion is equal Skin: no rash seen on limited exam Musculoskeletal: No gross abnormality Psychiatric:unable to assess Neurologic:no involuntary movements         Lab Data:   Basic Metabolic Panel: No results for input(s): NA, K, CL, CO2, GLUCOSE, BUN, CREATININE, CALCIUM, MG, PHOS in the last 168 hours.  ABG: No results for input(s): PHART, PCO2ART, PO2ART, HCO3, O2SAT in the last 168 hours.  Liver Function Tests: No results for input(s): AST, ALT, ALKPHOS, BILITOT, PROT, ALBUMIN in the last 168 hours. No results for input(s): LIPASE, AMYLASE in the last 168 hours. No results for input(s): AMMONIA in the last 168 hours.  CBC: Recent Labs  Lab 09/14/21 0510 09/16/21 0727 09/18/21 0711  WBC 5.2 5.5 5.9  HGB 10.2* 11.1* 10.4*  HCT 31.0* 34.7* 33.1*  MCV 97.2 98.3 97.9  PLT 198 209 239    Cardiac Enzymes: No results for input(s): CKTOTAL, CKMB, CKMBINDEX, TROPONINI in the last 168 hours.  BNP (last 3 results) No results for input(s): BNP in the last 8760 hours.  ProBNP (last 3 results) Recent Labs    01/29/21 0945  PROBNP 9.0    Radiological Exams: CT ABDOMEN PELVIS WO CONTRAST  Result Date: 09/18/2021 CLINICAL  DATA:  66 year old male with history of abdominal distension. Suspected bowel obstruction. EXAM: CT ABDOMEN AND PELVIS WITHOUT CONTRAST TECHNIQUE: Multidetector CT imaging of the abdomen and pelvis was performed following the standard protocol without IV contrast. COMPARISON:  CT the abdomen and pelvis 07/27/2021. FINDINGS: Lower chest: Enlargement of previously noted right pleural effusion, now moderate in size and likely partially loculated. Associated areas of passive subsegmental atelectasis are noted in the right lower lobe. Feeding tube extending into the stomach. Hepatobiliary: No suspicious cystic or solid hepatic lesions are confidently identified on today's noncontrast CT examination. Unenhanced appearance of the gallbladder is normal. Pancreas: No definite pancreatic mass or peripancreatic fluid collections or inflammatory changes are noted on today's noncontrast CT examination. Spleen: Unremarkable. Adrenals/Urinary Tract: 4.7 x 4.1 cm low-attenuation lesion in the upper pole of the right kidney, incompletely characterized on today's non-contrast CT examination, but statistically likely to represent a cyst. Unenhanced appearance of the left kidney and bilateral adrenal glands is normal. No hydroureteronephrosis. Urinary bladder is nearly completely decompressed around an indwelling Foley balloon catheter. Stomach/Bowel: Feeding tube terminates in the antral pre-pyloric region of the stomach. The appearance of the stomach is otherwise normal. No pathologic dilatation of small bowel or colon. A few scattered colonic diverticulae are noted, without surrounding inflammatory changes to suggest an acute diverticulitis at this time. Normal appendix. Vascular/Lymphatic: Aortic atherosclerosis, without evidence of aneurysm or dissection in the abdominal or pelvic vasculature. No lymphadenopathy noted in the abdomen or pelvis. Reproductive: Prostate gland and seminal vesicles are unremarkable in appearance. Other:  No significant volume of ascites.  No pneumoperitoneum. Musculoskeletal: Multiple old right-sided posterior and lateral rib fractures are again noted, incompletely healed. Old fractures of the superior and inferior pubic rami which are comminuted, displaced and incompletely healed, similar to the prior study. Old fractures of the sacral ala and medial aspect of the right ilium are again noted, with new postoperative changes of arthrodesis across the sacroiliac joints bilaterally. Postoperative changes of prior ORIF in the right hip. There are no aggressive appearing lytic or blastic lesions noted in the visualized portions of the skeleton. IMPRESSION: 1. No evidence of bowel obstruction. 2. No acute findings are noted in the abdomen or pelvis. 3. Enlarging moderate partially loculated right-sided pleural effusion with passive subsegmental atelectasis in the right lower lobe. 4. Colonic diverticulosis without evidence of acute diverticulitis at this time. 5. Aortic atherosclerosis. 48. Multiple old healing fractures, as above. Electronically Signed   By: Vinnie Langton M.D.   On: 09/18/2021 13:01    Assessment/Plan Active Problems:   Acute on chronic respiratory failure with hypoxia (HCC)   Traumatic fracture of ribs of right side with pneumothorax   History of alcohol abuse   Delirium due to another medical condition   Healthcare associated bacterial pneumonia   Acute on chronic respiratory failure with hypoxia we will continue with the T-piece titrate oxygen as tolerated continue pulmonary toilet. History of alcohol abuse supportive care we will continue to monitor Delirium no change Healthcare associated pneumonia has been treated with antibiotics following up on x-rays CT scan had shown partially loculated right pleural effusion and some subsegmental atelectasis.  May need to be tapped once again Traumatic rib fracture supportive care we will continue to monitor.   I have personally seen and  evaluated the patient, evaluated laboratory and imaging results, formulated the assessment and plan and placed orders. The Patient requires high complexity decision making with multiple systems involvement.  Rounds were done with the Respiratory Therapy Director and Staff therapists and discussed with nursing staff also.  Allyne Gee, MD [facility] Pulmonary Critical Care Medicine Sleep Medicine

## 2021-09-20 LAB — BASIC METABOLIC PANEL
Anion gap: 8 (ref 5–15)
BUN: 16 mg/dL (ref 8–23)
CO2: 33 mmol/L — ABNORMAL HIGH (ref 22–32)
Calcium: 9.5 mg/dL (ref 8.9–10.3)
Chloride: 98 mmol/L (ref 98–111)
Creatinine, Ser: 0.64 mg/dL (ref 0.61–1.24)
GFR, Estimated: >60 mL/min (ref >60)
Glucose, Bld: 110 mg/dL — ABNORMAL HIGH (ref 70–99)
Potassium: 3.9 mmol/L (ref 3.5–5.1)
Sodium: 139 mmol/L (ref 135–145)

## 2021-09-20 LAB — LACTATE DEHYDROGENASE: LDH: 114 U/L (ref 98–192)

## 2021-09-20 NOTE — Progress Notes (Signed)
Pulmonary Critical Care Medicine [facility] [place]   PULMONARY CRITICAL CARE SERVICE  PROGRESS NOTE     [name]  OXB:[id number]  DOB: 10-Aug-1956   DOA: 08/30/2021  Referring Physician: Luna Kitchens, MD  HPI: [name] is a 66 y.o. male being followed for ventilator/airway/oxygen weaning Acute on Chronic Respiratory Failure.  Patient is off the ventilator on T collar currently on 35% FiO2 secretions are reportedly very copious  Medications: Reviewed on Rounds  Physical Exam:  Vitals: Temperature is 97.0 pulse 89 respiratory 24 blood pressure is 119/83 saturations 94%  Ventilator Settings off the ventilator on T collar [place]  General: Comfortable at this time Neck: supple Cardiovascular: no malignant arrhythmias Respiratory: Scattered rhonchi expansion is equal Skin: no rash seen on limited exam Musculoskeletal: No gross abnormality Psychiatric:unable to assess Neurologic:no involuntary movements         Lab Data:   Basic Metabolic Panel: Recent Labs  Lab 09/20/21 0648  NA 139  K 3.9  CL 98  CO2 33*  GLUCOSE 110*  BUN 16  CREATININE 0.64  CALCIUM 9.5    ABG: No results for input(s): PHART, PCO2ART, PO2ART, HCO3, O2SAT in the last 168 hours.  Liver Function Tests: No results for input(s): AST, ALT, ALKPHOS, BILITOT, PROT, ALBUMIN in the last 168 hours. No results for input(s): LIPASE, AMYLASE in the last 168 hours. No results for input(s): AMMONIA in the last 168 hours.  CBC: Recent Labs  Lab 09/14/21 0510 09/16/21 0727 09/18/21 0711  WBC 5.2 5.5 5.9  HGB 10.2* 11.1* 10.4*  HCT 31.0* 34.7* 33.1*  MCV 97.2 98.3 97.9  PLT 198 209 239    Cardiac Enzymes: No results for input(s): CKTOTAL, CKMB, CKMBINDEX, TROPONINI in the last 168 hours.  BNP (last 3 results) No results for input(s): BNP in the last 8760 hours.  ProBNP (last 3 results) Recent Labs    01/29/21 0945  PROBNP 9.0    Radiological Exams: CT  ABDOMEN PELVIS WO CONTRAST  Result Date: 09/18/2021 CLINICAL DATA:  66 year old male with history of abdominal distension. Suspected bowel obstruction. EXAM: CT ABDOMEN AND PELVIS WITHOUT CONTRAST TECHNIQUE: Multidetector CT imaging of the abdomen and pelvis was performed following the standard protocol without IV contrast. COMPARISON:  CT the abdomen and pelvis 07/27/2021. FINDINGS: Lower chest: Enlargement of previously noted right pleural effusion, now moderate in size and likely partially loculated. Associated areas of passive subsegmental atelectasis are noted in the right lower lobe. Feeding tube extending into the stomach. Hepatobiliary: No suspicious cystic or solid hepatic lesions are confidently identified on today's noncontrast CT examination. Unenhanced appearance of the gallbladder is normal. Pancreas: No definite pancreatic mass or peripancreatic fluid collections or inflammatory changes are noted on today's noncontrast CT examination. Spleen: Unremarkable. Adrenals/Urinary Tract: 4.7 x 4.1 cm low-attenuation lesion in the upper pole of the right kidney, incompletely characterized on today's non-contrast CT examination, but statistically likely to represent a cyst. Unenhanced appearance of the left kidney and bilateral adrenal glands is normal. No hydroureteronephrosis. Urinary bladder is nearly completely decompressed around an indwelling Foley balloon catheter. Stomach/Bowel: Feeding tube terminates in the antral pre-pyloric region of the stomach. The appearance of the stomach is otherwise normal. No pathologic dilatation of small bowel or colon. A few scattered colonic diverticulae are noted, without surrounding inflammatory changes to suggest an acute diverticulitis at this time. Normal appendix. Vascular/Lymphatic: Aortic atherosclerosis, without evidence of aneurysm or dissection in the abdominal or pelvic vasculature. No lymphadenopathy noted  in the abdomen or pelvis. Reproductive: Prostate  gland and seminal vesicles are unremarkable in appearance. Other: No significant volume of ascites.  No pneumoperitoneum. Musculoskeletal: Multiple old right-sided posterior and lateral rib fractures are again noted, incompletely healed. Old fractures of the superior and inferior pubic rami which are comminuted, displaced and incompletely healed, similar to the prior study. Old fractures of the sacral ala and medial aspect of the right ilium are again noted, with new postoperative changes of arthrodesis across the sacroiliac joints bilaterally. Postoperative changes of prior ORIF in the right hip. There are no aggressive appearing lytic or blastic lesions noted in the visualized portions of the skeleton. IMPRESSION: 1. No evidence of bowel obstruction. 2. No acute findings are noted in the abdomen or pelvis. 3. Enlarging moderate partially loculated right-sided pleural effusion with passive subsegmental atelectasis in the right lower lobe. 4. Colonic diverticulosis without evidence of acute diverticulitis at this time. 5. Aortic atherosclerosis. 6. Multiple old healing fractures, as above. Electronically Signed   By: Trudie Reed M.D.   On: 09/18/2021 13:01    Assessment/Plan Active Problems:   Acute on chronic respiratory failure with hypoxia (HCC)   Traumatic fracture of ribs of right side with pneumothorax   History of alcohol abuse   Delirium due to another medical condition   Healthcare associated bacterial pneumonia   Acute on chronic respiratory failure with hypoxia we will continue to wean on T-piece.  Patient is on 35% FiO2 at baseline continue with secretion management limiting factor for weaning already excessive secretions Traumatic rib fractures supportive care patient has had pleural effusion which has been tapped History of alcohol abuse no change we will continue to monitor closely Delirium supportive care Healthcare associated pneumonia has been treated   I have personally seen  and evaluated the patient, evaluated laboratory and imaging results, formulated the assessment and plan and placed orders. The Patient requires high complexity decision making with multiple systems involvement.  Rounds were done with the Respiratory Therapy Director and Staff therapists and discussed with nursing staff also.  Yevonne Pax, MD [facility] Pulmonary Critical Care Medicine Sleep Medicine

## 2021-09-21 ENCOUNTER — Ambulatory Visit: Payer: 59 | Admitting: Internal Medicine

## 2021-09-21 ENCOUNTER — Other Ambulatory Visit (HOSPITAL_COMMUNITY): Payer: Self-pay

## 2021-09-21 HISTORY — PX: IR THORACENTESIS RIGHT ASP PLEURAL SPACE W/IMG GUIDE: IMG5380

## 2021-09-21 LAB — CBC
HCT: 34.8 % — ABNORMAL LOW (ref 39.0–52.0)
Hemoglobin: 11.4 g/dL — ABNORMAL LOW (ref 13.0–17.0)
MCH: 30.9 pg (ref 26.0–34.0)
MCHC: 32.8 g/dL (ref 30.0–36.0)
MCV: 94.3 fL (ref 80.0–100.0)
Platelets: 271 10*3/uL (ref 150–400)
RBC: 3.69 MIL/uL — ABNORMAL LOW (ref 4.22–5.81)
RDW: 12.6 % (ref 11.5–15.5)
WBC: 5.7 10*3/uL (ref 4.0–10.5)
nRBC: 0 % (ref 0.0–0.2)

## 2021-09-21 LAB — BODY FLUID CELL COUNT WITH DIFFERENTIAL
Eos, Fluid: 33 %
Lymphs, Fluid: 46 %
Monocyte-Macrophage-Serous Fluid: 18 % — ABNORMAL LOW (ref 50–90)
Neutrophil Count, Fluid: 1 % (ref 0–25)
Other Cells, Fluid: 2 %
Total Nucleated Cell Count, Fluid: 345 cu mm (ref 0–1000)

## 2021-09-21 LAB — GLUCOSE, PLEURAL OR PERITONEAL FLUID: Glucose, Fluid: 124 mg/dL

## 2021-09-21 LAB — PROTEIN, PLEURAL OR PERITONEAL FLUID: Total protein, fluid: 4.9 g/dL

## 2021-09-21 LAB — LACTATE DEHYDROGENASE, PLEURAL OR PERITONEAL FLUID: LD, Fluid: 155 U/L — ABNORMAL HIGH (ref 3–23)

## 2021-09-21 MED ORDER — LIDOCAINE HCL (PF) 1 % IJ SOLN
INTRAMUSCULAR | Status: DC | PRN
  Administered 2021-09-21: 5 mL

## 2021-09-21 MED ORDER — LIDOCAINE HCL 1 % IJ SOLN
INTRAMUSCULAR | Status: AC
  Filled 2021-09-21: qty 20

## 2021-09-21 NOTE — Progress Notes (Signed)
Pulmonary Critical Care Medicine [facility] [place]   PULMONARY CRITICAL CARE SERVICE  PROGRESS NOTE     Crosby Oriordan [place] III  ERX:[id number]  DOB: 02/16/1956   DOA: 08/30/2021  Referring Physician: Luna Kitchens, MD  HPI: Darrel Reach III is a 66 y.o. male being followed for ventilator/airway/oxygen weaning Acute on Chronic Respiratory Failure.  Patient is on T collar currently on 35% FiO2 saturations are good  Medications: Reviewed on Rounds  Physical Exam:  Vitals: Temperature is 97.3 pulse 82 respiratory 23 blood pressure is 138/85 saturations 97%  Ventilator Settings on T collar FiO2 is 28% up to 35%  General: Comfortable at this time Neck: supple Cardiovascular: no malignant arrhythmias Respiratory: Scattered rhonchi expansion is equal Skin: no rash seen on limited exam Musculoskeletal: No gross abnormality Psychiatric:unable to assess Neurologic:no involuntary movements         Lab Data:   Basic Metabolic Panel: Recent Labs  Lab 09/20/21 0648  NA 139  K 3.9  CL 98  CO2 33*  GLUCOSE 110*  BUN 16  CREATININE 0.64  CALCIUM 9.5    ABG: No results for input(s): PHART, PCO2ART, PO2ART, HCO3, O2SAT in the last 168 hours.  Liver Function Tests: No results for input(s): AST, ALT, ALKPHOS, BILITOT, PROT, ALBUMIN in the last 168 hours. No results for input(s): LIPASE, AMYLASE in the last 168 hours. No results for input(s): AMMONIA in the last 168 hours.  CBC: Recent Labs  Lab 09/16/21 0727 09/18/21 0711 09/21/21 0739  WBC 5.5 5.9 5.7  HGB 11.1* 10.4* 11.4*  HCT 34.7* 33.1* 34.8*  MCV 98.3 97.9 94.3  PLT 209 239 271    Cardiac Enzymes: No results for input(s): CKTOTAL, CKMB, CKMBINDEX, TROPONINI in the last 168 hours.  BNP (last 3 results) No results for input(s): BNP in the last 8760 hours.  ProBNP (last 3 results) Recent Labs    01/29/21 0945  PROBNP 9.0    Radiological Exams: DG CHEST PORT 1 VIEW  Result Date:  09/21/2021 CLINICAL DATA:  Pneumonia and pleural effusion. EXAM: PORTABLE CHEST 1 VIEW COMPARISON:  09/14/2021 FINDINGS: 0542 hours. Tracheostomy tube again noted. Persistent right base collapse/consolidation with layering pleural effusion. There is some minimal atelectasis in the left base. The cardio pericardial silhouette is enlarged. Bones are diffusely demineralized. Left shoulder replacement. A feeding tube passes into the stomach although the distal tip position is not included on the film. IMPRESSION: Progressive right base collapse/consolidation with probable layering pleural effusion. Minimal atelectasis left base. Electronically Signed   By: Kennith Center M.D.   On: 09/21/2021 06:32    Assessment/Plan Active Problems:   Acute on chronic respiratory failure with hypoxia (HCC)   Traumatic fracture of ribs of right side with pneumothorax   History of alcohol abuse   Delirium due to another medical condition   Healthcare associated bacterial pneumonia   Acute on chronic respiratory failure hypoxia plan is to continue with the T collar secretions are still copious requiring frequent suctioning.  Continue pulmonary toilet supportive care. Traumatic rib fractures with pneumothorax resolved History of alcohol abuse no active withdrawal Delirium no change we will continue to follow along closely Healthcare associated pneumonia has been treated with antibiotics   I have personally seen and evaluated the patient, evaluated laboratory and imaging results, formulated the assessment and plan and placed orders. The Patient requires high complexity decision making with multiple systems involvement.  Rounds were done with the Respiratory Therapy Director and Staff therapists and discussed  with nursing staff also.  Allyne Gee, MD [facility] Pulmonary Critical Care Medicine Sleep Medicine

## 2021-09-21 NOTE — Procedures (Signed)
PROCEDURE SUMMARY:  Successful image-guided right thoracentesis. Yielded 700 milliliters of clear dark yellow fluid. Patient tolerated procedure well. EBL < 1 mL No immediate complications.  Specimen was sent for labs. Post procedure CXR shows no pneumothorax.  Please see imaging section of Epic for full dictation.  Villa Herb PA-C 09/21/2021 11:04 AM

## 2021-09-22 ENCOUNTER — Institutional Professional Consult (permissible substitution) (HOSPITAL_COMMUNITY): Payer: Self-pay

## 2021-09-22 LAB — CBC
HCT: 34.8 % — ABNORMAL LOW (ref 39.0–52.0)
Hemoglobin: 11.2 g/dL — ABNORMAL LOW (ref 13.0–17.0)
MCH: 30.6 pg (ref 26.0–34.0)
MCHC: 32.2 g/dL (ref 30.0–36.0)
MCV: 95.1 fL (ref 80.0–100.0)
Platelets: 272 10*3/uL (ref 150–400)
RBC: 3.66 MIL/uL — ABNORMAL LOW (ref 4.22–5.81)
RDW: 12.7 % (ref 11.5–15.5)
WBC: 5.6 10*3/uL (ref 4.0–10.5)
nRBC: 0 % (ref 0.0–0.2)

## 2021-09-22 LAB — BASIC METABOLIC PANEL
Anion gap: 7 (ref 5–15)
BUN: 17 mg/dL (ref 8–23)
CO2: 29 mmol/L (ref 22–32)
Calcium: 9.5 mg/dL (ref 8.9–10.3)
Chloride: 101 mmol/L (ref 98–111)
Creatinine, Ser: 0.59 mg/dL — ABNORMAL LOW (ref 0.61–1.24)
GFR, Estimated: >60 mL/min (ref >60)
Glucose, Bld: 126 mg/dL — ABNORMAL HIGH (ref 70–99)
Potassium: 3.5 mmol/L (ref 3.5–5.1)
Sodium: 137 mmol/L (ref 135–145)

## 2021-09-22 LAB — PATHOLOGIST SMEAR REVIEW

## 2021-09-22 LAB — MAGNESIUM: Magnesium: 2.1 mg/dL (ref 1.7–2.4)

## 2021-09-22 LAB — PHOSPHORUS: Phosphorus: 4.9 mg/dL — ABNORMAL HIGH (ref 2.5–4.6)

## 2021-09-22 NOTE — Progress Notes (Signed)
Pulmonary Critical Care Medicine [facility] [place]   PULMONARY CRITICAL CARE SERVICE  PROGRESS NOTE     [name]  [id number]  DOB: 1956/01/21   DOA: 08/30/2021  Referring Physician: Luna Kitchens, MD  HPI: [name] is a 66 y.o. male being followed for ventilator/airway/oxygen weaning Acute on Chronic Respiratory Failure.  Patient had thoracentesis yesterday also of note secretions are looking better  Medications: Reviewed on Rounds  Physical Exam:  Vitals: Temperature is 97.8 pulse 82 respiratory 27 blood pressure is 114/72 saturations 96%  Ventilator Settings on T collar FiO2 35% using the PMV  General: Comfortable at this time Neck: supple Cardiovascular: no malignant arrhythmias Respiratory: Coarse rhonchi expansion is equal Skin: no rash seen on limited exam Musculoskeletal: No gross abnormality Psychiatric:unable to assess Neurologic:no involuntary movements         Lab Data:   Basic Metabolic Panel: Recent Labs  Lab 09/20/21 0648 09/22/21 0623  NA 139 137  K 3.9 3.5  CL 98 101  CO2 33* 29  GLUCOSE 110* 126*  BUN 16 17  CREATININE 0.64 0.59*  CALCIUM 9.5 9.5  MG  --  2.1  PHOS  --  4.9*    ABG: No results for input(s): PHART, PCO2ART, PO2ART, HCO3, O2SAT in the last 168 hours.  Liver Function Tests: No results for input(s): AST, ALT, ALKPHOS, BILITOT, PROT, ALBUMIN in the last 168 hours. No results for input(s): LIPASE, AMYLASE in the last 168 hours. No results for input(s): AMMONIA in the last 168 hours.  CBC: Recent Labs  Lab 09/16/21 0727 09/18/21 0711 09/21/21 0739 09/22/21 0623  WBC 5.5 5.9 5.7 5.6  HGB 11.1* 10.4* 11.4* 11.2*  HCT 34.7* 33.1* 34.8* 34.8*  MCV 98.3 97.9 94.3 95.1  PLT 209 239 271 272    Cardiac Enzymes: No results for input(s): CKTOTAL, CKMB, CKMBINDEX, TROPONINI in the last 168 hours.  BNP (last 3 results) No results for input(s): BNP in the last 8760 hours.  ProBNP (last  3 results) Recent Labs    01/29/21 0945  PROBNP 9.0    Radiological Exams: DG Chest 1 View  Result Date: 09/21/2021 CLINICAL DATA:  S/p thoracentesis; removed from right side. Hx of recent pelvic ring fracture. EXAM: CHEST  1 VIEW COMPARISON:  09/21/2021 FINDINGS: No pneumothorax. No significant pleural effusion evident. Improved aeration at the lung bases. Heart size and mediastinal contours are within normal limits. Tracheostomy device and feeding tube stable in position. Left shoulder arthroplasty hardware. IMPRESSION: No pneumothorax post thoracentesis. Critical Value/emergent results were called by telephone at the time of interpretation on 09/21/2021 at 11:44 am to provider [facility], who acknowledged these results via spark electronic messaging service. Electronically Signed   By: Corlis Leak M.D.   On: 09/21/2021 11:44   DG Pelvis Comp Min 3V  Result Date: 09/21/2021 CLINICAL DATA:  66 year old male with pelvic ring fracture in November status post SI joint fixation. EXAM: JUDET PELVIS - 3+ VIEW COMPARISON:  08/20/2021 radiographs and earlier. CT Abdomen and Pelvis 09/18/2021. FINDINGS: Proximal right femur ORIF appears stable and intact. Femoral heads remain normally located. Proximal femurs appear Two cannulated screws traverse the iliac and sacral bones as before and appear stable since last month. Superimposed bilateral comminuted medial iliac, sacral, and bilateral pubic rami fractures redemonstrated. Offset of the left superior pubic ramus appears stable from the recent CT but increased since December 2nd. Other pubic symphysis alignment not significantly changed. Negative visible bowel gas  pattern. IMPRESSION: 1. Comminuted bilateral pubic rami. Superior offset of the left superior ramus is stable from a recent CT but increased since December 2nd. 2. Stable bilateral sacral cannulated screws. No new osseous abnormality identified. Electronically Signed   By: Odessa Fleming M.D.   On:  09/21/2021 11:47   DG CHEST PORT 1 VIEW  Result Date: 09/21/2021 CLINICAL DATA:  Pneumonia and pleural effusion. EXAM: PORTABLE CHEST 1 VIEW COMPARISON:  09/14/2021 FINDINGS: 0542 hours. Tracheostomy tube again noted. Persistent right base collapse/consolidation with layering pleural effusion. There is some minimal atelectasis in the left base. The cardio pericardial silhouette is enlarged. Bones are diffusely demineralized. Left shoulder replacement. A feeding tube passes into the stomach although the distal tip position is not included on the film. IMPRESSION: Progressive right base collapse/consolidation with probable layering pleural effusion. Minimal atelectasis left base. Electronically Signed   By: Kennith Center M.D.   On: 09/21/2021 06:32   IR THORACENTESIS ASP PLEURAL SPACE W/IMG GUIDE  Result Date: 09/21/2021 INDICATION: Patient with history of fall multiple rib fractures, recurrent right pleural effusion. Request IR for diagnostic and therapeutic right thoracentesis. EXAM: ULTRASOUND GUIDED RIGHT THORACENTESIS MEDICATIONS: 7 mL 1% lidocaine COMPLICATIONS: None immediate. PROCEDURE: An ultrasound guided thoracentesis was thoroughly discussed with the patient and questions answered. The benefits, risks, alternatives and complications were also discussed. The patient understands and wishes to proceed with the procedure. Written consent was obtained. Ultrasound was performed to localize and mark an adequate pocket of fluid in the right chest. The area was then prepped and draped in the normal sterile fashion. 1% Lidocaine was used for local anesthesia. Under ultrasound guidance a 6 Fr Safe-T-Centesis catheter was introduced. Thoracentesis was performed. The catheter was removed and a dressing applied. FINDINGS: A total of approximately 700 mL of clear, dark yellow fluid was removed. Samples were sent to the laboratory as requested by the clinical team. IMPRESSION: Successful ultrasound guided right  thoracentesis yielding 700 mL of pleural fluid. Read by Lynnette Caffey, PA-C Electronically Signed   By: Irish Lack M.D.   On: 09/21/2021 11:23    Assessment/Plan Active Problems:   Acute on chronic respiratory failure with hypoxia (HCC)   Traumatic fracture of ribs of right side with pneumothorax   History of alcohol abuse   Delirium due to another medical condition   Healthcare associated bacterial pneumonia   Acute on chronic respiratory failure with hypoxia plan is going to try capping trials today Traumatic rib fractures status post thoracentesis for pleural effusion History of alcohol abuse no active withdrawal Delirium no change we will continue to monitor Healthcare associated pneumonia treated we will continue to follow along closely   I have personally seen and evaluated the patient, evaluated laboratory and imaging results, formulated the assessment and plan and placed orders. The Patient requires high complexity decision making with multiple systems involvement.  Rounds were done with the Respiratory Therapy Director and Staff therapists and discussed with nursing staff also.  Yevonne Pax, MD [facility] Pulmonary Critical Care Medicine Sleep Medicine

## 2021-09-26 LAB — CULTURE, BODY FLUID W GRAM STAIN -BOTTLE: Culture: NO GROWTH

## 2021-09-28 ENCOUNTER — Encounter (INDEPENDENT_AMBULATORY_CARE_PROVIDER_SITE_OTHER): Payer: BC Managed Care – PPO | Admitting: Ophthalmology

## 2021-10-13 ENCOUNTER — Ambulatory Visit: Payer: 59 | Admitting: Nurse Practitioner
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
## 2021-10-21 ENCOUNTER — Ambulatory Visit: Payer: BC Managed Care – PPO | Attending: Internal Medicine | Admitting: Physical Therapy

## 2021-10-21 ENCOUNTER — Encounter: Payer: Self-pay | Admitting: Physical Therapy

## 2021-10-21 ENCOUNTER — Other Ambulatory Visit: Payer: Self-pay

## 2021-10-21 DIAGNOSIS — G8929 Other chronic pain: Secondary | ICD-10-CM | POA: Diagnosis present

## 2021-10-21 DIAGNOSIS — R262 Difficulty in walking, not elsewhere classified: Secondary | ICD-10-CM | POA: Insufficient documentation

## 2021-10-21 DIAGNOSIS — M6281 Muscle weakness (generalized): Secondary | ICD-10-CM | POA: Diagnosis present

## 2021-10-21 DIAGNOSIS — M5440 Lumbago with sciatica, unspecified side: Secondary | ICD-10-CM | POA: Diagnosis not present

## 2021-10-21 NOTE — Patient Instructions (Signed)
Aquatic Therapy at [facility]-  What to Expect!  Where:   [facility] [address] [place], [place] [address] Rehab phone [phone]  NOTE:  You will receive an automated phone message reminding you of your appt and it will say the appointment is at the [facility] clinic.          How to Prepare: Please make sure you drink 8 ounces of water about one hour prior to your pool session A caregiver may attend if needed with the patient to help assist as needed. A caregiver can sit in the pool room on chair. Please arrive IN YOUR SUIT and 15 minutes prior to your appointment - this helps to avoid delays in starting your session. Please make sure to attend to any toileting needs prior to entering the pool [place] rooms for changing are provided.   There is direct access to the pool deck form the locker room.  You can lock your belongings in a locker with lock provided. Once on the pool deck your therapist will ask if you have signed the Patient  Consent and Assignment of Benefits form before beginning treatment Your therapist may take your blood pressure prior to, during and after your session if indicated We usually try and create a home exercise program based on activities we do in the pool.  Please be thinking about who might be able to assist you in the pool should you need to participate in an aquatic home exercise program at the time of discharge if you need assistance.  Some patients do not want to or do not have the ability to participate in an aquatic home program - this is not a barrier in any way to you participating in aquatic therapy as part of your current therapy plan! After Discharge from PT, you can continue using home program at  the [facility], there is a drop-in fee for $5 ($45 a month)or for 60 years  or older $4.00 ($40 a month for seniors ) or any local [facility] pool.  Memberships for purchase are  available for gym/pool at [facility]  IT IS VERY IMPORTANT THAT YOUR LAST VISIT BE IN THE CLINIC AT [facility] [address]REET AFTER YOUR LAST AQUATIC VISIT.  PLEASE MAKE SURE THAT YOU HAVE A [address]  APPOINTMENT SCHEDULED.   About the pool: Pool is located approximately 500 FT from the entrance of the building.  Please bring a support person if you need assistance traveling this      distance.   Your therapist will assist you in entering the water; there are two ways to           enter: stairs with railings, and a mechanical lift. Your therapist will determine the most appropriate way for you.  Water temperature is usually between 88-90 degrees  There may be up to 2 other swimmers in the pool at the same time  The pool deck is tile, please wear shoes with good traction if you prefer not to be barefoot.    Contact Info:  For appointment scheduling and cancellations:         Please call the [facility]  PH:[phone]              Aquatic Therapy  Outpatient Rehabilitation @ [facility]       All sessions are 45 minutes  Garen Lah, PT, ATRIC Certified Exercise Expert for the Aging Adult  10/21/21 11:53 AM Phone: [phone] Fax: [phone]

## 2021-10-25 NOTE — Therapy (Incomplete)
OUTPATIENT PHYSICAL THERAPY TREATMENT NOTE   Patient Name: [name] MRN: [id number] DOB:[birth year], 66 y.o., male Today's Date: 10/25/2021  PCP: Shayne Alken, MD REFERRING PROVIDER: Luna Kitchens Delore*    Past Medical History:  Diagnosis Date   COPD (chronic obstructive pulmonary disease) (HCC)    GERD (gastroesophageal reflux disease)    History of hepatitis C    treated several years ago through [facility]   Hypertension    Past Surgical History:  Procedure Laterality Date   CATARACT EXTRACTION, BILATERAL     EYE SURGERY Bilateral    IR THORACENTESIS ASP PLEURAL SPACE W/IMG GUIDE  09/14/2021   IR THORACENTESIS ASP PLEURAL SPACE W/IMG GUIDE  09/21/2021   ORIF PELVIC FRACTURE WITH PERCUTANEOUS SCREWS Right 07/29/2021   Procedure: SI SCREW FIXATION OF RIGHT PELVIC RING;  Surgeon: Myrene Galas, MD;  Location: [facility] OR;  Service: Orthopedics;  Laterality: Right;   ORTHOPEDIC SURGERY Bilateral    pt states fracture repairs to arms and legs.   REVERSE SHOULDER ARTHROPLASTY Left 09/23/2019   Procedure: REVERSE SHOULDER ARTHROPLASTY;  Surgeon: Jones Broom, MD;  Location: [facility];  Service: Orthopedics;  Laterality: Left;   TRACHEOSTOMY TUBE PLACEMENT N/A 08/10/2021   Procedure: TRACHEOSTOMY;  Surgeon: Violeta Gelinas, MD;  Location: [facility] OR;  Service: General;  Laterality: N/A;   Patient Active Problem List   Diagnosis Date Noted   Acute on chronic respiratory failure with hypoxia (HCC) 09/08/2021   Traumatic fracture of ribs of right side with pneumothorax 09/08/2021   History of alcohol abuse 09/08/2021   Delirium due to another medical condition 09/08/2021   Healthcare associated bacterial pneumonia 09/08/2021   Anxiety disorder due to known physiological condition    Fall 07/27/2021   Screening for prostate cancer 01/27/2021   Elevated LFTs 01/27/2021   Former smoker 01/27/2021   Chronic respiratory failure with hypoxia (HCC) 11/08/2020   Obstructive sleep  apnea hypopnea, severe 09/24/2020   Exudative age-related macular degeneration of left eye with active choroidal neovascularization (HCC) 01/28/2020   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   Exudative age-related macular degeneration of right eye with active choroidal neovascularization (HCC) 01/28/2020   CAD (coronary artery disease) 10/16/2019   S/P reverse total shoulder arthroplasty, left 09/23/2019   Overweight (BMI 25.0-29.9) 08/13/2019   Hyperlipidemia 08/13/2019   Essential hypertension 08/13/2019   Gastroesophageal reflux disease 08/13/2019   Macular degeneration, wet (HCC) 11/09/2017   Lung nodules 10/28/2015   COPD mixed type (HCC) 10/01/2014   Chest x-ray abnormality 10/01/2014    REFERRING DIAG: pelvic fracture  THERAPY DIAG:  No diagnosis found.  PERTINENT HISTORY: ORIF Pelvic fx with percutaneous screws 07/29/21, reverse TSR Left,2021, tracheostomy 11/22 22, COPD, GERD , Hx of Hep C, HTN See medical history   PRECAUTIONS: None  Yes WBAT bil  SUBJECTIVE: ***  PAIN:  Are you having pain? {yes/no:20286} NPRS scale: ***/10 Pain location: *** Pain orientation: {Pain Orientation:25161}  PAIN TYPE: {type:313116} Pain description: {PAIN DESCRIPTION:21022940}  Aggravating factors: *** Relieving factors: ***     OBJECTIVE:    DIAGNOSTIC FINDINGS:  IMPRESSION: 1. Comminuted bilateral pubic rami. Superior offset of the left superior ramus is stable from a recent CT but increased since December 2nd. 2. Stable bilateral sacral cannulated screws. No new osseous abnormality identified.   PATIENT SURVEYS:  FOTO 28% predicted 56%   SCREENING FOR RED FLAGS: Bowel or bladder incontinence: No Spinal tumors: No Cauda equina syndrome: No no numbness noted Compression fracture: No Abdominal aneurysm: No  COGNITION:          Overall cognitive status: Within functional limits for tasks assessed                        SENSATION:          Light  touch: Appears intact          Stereognosis: Appears intact          Hot/Cold: Appears intact          Proprioception: Appears intact   MUSCLE LENGTH: Hamstrings: Right 55 deg pain; Left 68 deg Thomas test: Right 25 deg; Left 20 deg   POSTURE:  Noted bil tremors noted bil UE     PALPATION: Increased pain over bil lumbar paraspinal  TTP over R SI   LUMBARAROM/PROM   A/PROM A/PROM  10/21/2021  Flexion Mid shin  Extension 25%  Right lateral flexion Finger tip to knee jt line  Left lateral flexion 4 inch finger tip above knee jt line  Right rotation 60%  Left rotation 60%   (Blank rows = not tested)   LE AROM/PROM:   A/PROM Right 10/21/2021 Left 10/21/2021  Hip flexion 121 118  Hip extension      Hip abduction      Hip adduction      Hip internal rotation      Hip external rotation      Knee flexion 130 135  Knee extension -6 -3  Ankle dorsiflexion      Ankle plantarflexion      Ankle inversion      Ankle eversion       (Blank rows = not tested)   LE MMT: NT  with resistance due to pelvic fracture, standing to assess gross strength MMT Right 10/21/2021 Left 10/21/2021  Hip flexion 3-/5 3-/5  Hip extension 3-/5 3-/5  Hip abduction 3-/5 3-/5  Hip adduction      Hip internal rotation      Hip external rotation      Knee flexion      Knee extension 3-/5 3-/5  Ankle dorsiflexion      Ankle plantarflexion      Ankle inversion      Ankle eversion       (Blank rows = not tested)   LUMBAR SPECIAL TESTS:  Not tested due to pelvic fracture   FUNCTIONAL TESTS:  30 sec sit to stand 7 in 30 sec, unable to do 5 x STS must use hands   GAIT: Distance walked: 150 Assistive device utilized: Walker - 2 wheeled Level of assistance: Modified independence Comments: Pt gait velocity 1.83 ft/sec decreased stride length. Pt walker adjusted for pt height       TODAY'S TREATMENT  Adjusted walker and gave initial HEP     PATIENT EDUCATION:  Education details: POC, FOTO  report  Explanation of findings Person educated: Patient Education method: Explanation, Demonstration, Tactile cues, Verbal cues, and Handouts Education comprehension: verbalized understanding, returned demonstration, verbal cues required, and needs further education     HOME EXERCISE PROGRAM: Access Code: [email or web address] URL: [email or web address] Date: 10/21/2021 Prepared by: Garen LahLawrie Yona Kosek   Exercises Supine Single Knee to Chest Stretch - 2-3 x daily - 7 x weekly - 1 sets - 5 reps - 10 hold Supine Lower Trunk Rotation - 2-3 x daily - 7 x weekly - 1 sets - 5 reps - 20 hold Sit to Stand with Counter Support - 3 x daily - 7  x weekly - 1 sets - 10 reps     ASSESSMENT:   CLINICAL IMPRESSION: Patient is a 66 y.o. male presented to ED on 07/27/21 after fall off 8 foot ladder  Hospitalized for 10 weeks with CT chest found small R apical pneumothorax, multiple displaced posterior R rib fxs, nondisplaced R iliac wing fx with widening of R SI joint, displaced fx both inferior pubic rami and L superior pubic ramus, and nondisplaced L sacral fx, SI screw 07/29/21. intubated 11/9, trach 11/22. Trach collar 11/30who was seen today for physical therapy evaluation and treatment for pelvic fracture and subsequent low back pain and difficulty walking.  Pt would like to return to work with ability to climb ladders and lift objects. Objective impairments include decreased activity tolerance, decreased knowledge of use of DME, decreased mobility, difficulty walking, decreased ROM, decreased strength, impaired flexibility, improper body mechanics, postural dysfunction, and pain. These impairments are limiting patient from cleaning, community activity, meal prep, occupation, laundry, and shopping. Personal factors including Fitness, Past/current experiences, and 3+ comorbidities: hospitalized for 10 weeks  See medical history  are also affecting patient's functional outcome. Patient will benefit from skilled  PT to address above impairments and improve overall function.          REHAB POTENTIAL: Good   CLINICAL DECISION MAKING: Evolving/moderate complexity   EVALUATION COMPLEXITY: Moderate     GOALS: Goals reviewed with patient? Yes partially   SHORT TERM GOALS:   STG Name Target Date Goal status  1 Pt will be independent with initial HEP Baseline: no knowledge 11/18/2021 INITIAL  2 Pt will be able to perform Sit to stand with no use of UE Baseline: Pt requires heavy use of UE for rising from sit to stand 11/18/2021 INITIAL  3 Pt will be able to verbalize 3 points for sleep hygiene and implement Baseline:Pt wakes 4-5 times a night due to pain 11/18/2021 INITIAL  4 Pt will use LRAD to walk for 6 MWT Baseline: unable to walk for longer than 5 minutes 11/18/2021 INITIAL    LONG TERM GOALS:    LTG Name Target Date Goal status  1 Pt will be independent with advanced HEP  Baseline: no knowledge 12/16/2021 INITIAL  2 Pt will be able to perform 5 X sts in 13 seconds or less for decreased risk of fall and increased  LE MMT Baseline: 12/16/2021 INITIAL  3 Pt will be able to pick up 25 # of groceries from the ground wihtout exacerbating pain Baseline: unable to lift any amount due to pelvic fracture and pain 12/16/2021 INITIAL  4 Pt will return to walking without  need AD for exercise for at least 1 mile  Baseline:Used to walk 5 miles a day but cannot walk for exercise 12/16/2021 INITIAL  5 Pt will be able to deadlift 75# in order to be able to carry materials for work related activities Baseline: unable to lift at this time 12/16/2021 INITIAL  6 Pt will be able to sleep through the night without waking due to turning for at least 4-5 hours  Baseline: wake every time he turns at night 12/16/2021 INITIAL  7 Pt will be able to walk in community for 2.62 Ft/sec velocity Baseline: eval 1.42ft/sec 12/16/2021 INITIAL    8 Pt will demonstrate ability climb ladder for return to work as well as rise from ground  independently to show decrease in fall risk for work conditions Baseline: Pt learning to utilized walker to ambulate at eval   12/16/2021  INITAL    PLAN: PT FREQUENCY: 2x/week   PT DURATION: 8 weeks   PLANNED INTERVENTIONS: Therapeutic exercises, Therapeutic activity, Neuro Muscular re-education, Balance training, Gait training, Patient/Family education, Joint mobilization, Stair training, DME instructions, Aquatic Therapy, Dry Needling, Electrical stimulation, Spinal mobilization, Cryotherapy, Moist heat, Taping, Ionotophoresis 4mg /ml Dexamethasone, and Manual therapy   PLAN FOR NEXT SESSION: Review HEP and progress.. Gait training   ***

## 2021-10-26 ENCOUNTER — Ambulatory Visit: Payer: BC Managed Care – PPO | Admitting: Physical Therapy

## 2021-10-26 ENCOUNTER — Other Ambulatory Visit: Payer: Self-pay

## 2021-10-26 ENCOUNTER — Ambulatory Visit: Payer: BC Managed Care – PPO

## 2021-10-26 DIAGNOSIS — M5440 Lumbago with sciatica, unspecified side: Secondary | ICD-10-CM | POA: Diagnosis not present

## 2021-10-26 DIAGNOSIS — G8929 Other chronic pain: Secondary | ICD-10-CM

## 2021-10-26 DIAGNOSIS — M6281 Muscle weakness (generalized): Secondary | ICD-10-CM

## 2021-10-26 DIAGNOSIS — R262 Difficulty in walking, not elsewhere classified: Secondary | ICD-10-CM

## 2021-10-26 NOTE — Therapy (Signed)
OUTPATIENT PHYSICAL THERAPY TREATMENT NOTE   Patient Name: [name] MRN: [id number] DOB:April 19, 1956, 66 y.o., male Today's Date: 10/26/2021  PCP: Shayne Alken, MD REFERRING PROVIDER: Romualdo Bolk*   PT End of Session - 10/26/21 1132     Visit Number 2    Number of Visits 16    Date for PT Re-Evaluation 12/16/21    Authorization Type [employer]    PT Start Time 1050    PT Stop Time 1134    PT Time Calculation (min) 44 min    Equipment Utilized During Treatment Gait belt    Activity Tolerance Patient tolerated treatment well    Behavior During Therapy WFL for tasks assessed/performed             Past Medical History:  Diagnosis Date   COPD (chronic obstructive pulmonary disease) (HCC)    GERD (gastroesophageal reflux disease)    History of hepatitis C    treated several years ago through [facility]   Hypertension    Past Surgical History:  Procedure Laterality Date   CATARACT EXTRACTION, BILATERAL     EYE SURGERY Bilateral    IR THORACENTESIS ASP PLEURAL SPACE W/IMG GUIDE  09/14/2021   IR THORACENTESIS ASP PLEURAL SPACE W/IMG GUIDE  09/21/2021   ORIF PELVIC FRACTURE WITH PERCUTANEOUS SCREWS Right 07/29/2021   Procedure: SI SCREW FIXATION OF RIGHT PELVIC RING;  Surgeon: Myrene Galas, MD;  Location: [facility] OR;  Service: Orthopedics;  Laterality: Right;   ORTHOPEDIC SURGERY Bilateral    pt states fracture repairs to arms and legs.   REVERSE SHOULDER ARTHROPLASTY Left 09/23/2019   Procedure: REVERSE SHOULDER ARTHROPLASTY;  Surgeon: Jones Broom, MD;  Location: [facility];  Service: Orthopedics;  Laterality: Left;   TRACHEOSTOMY TUBE PLACEMENT N/A 08/10/2021   Procedure: TRACHEOSTOMY;  Surgeon: Violeta Gelinas, MD;  Location: [facility] OR;  Service: General;  Laterality: N/A;   Patient Active Problem List   Diagnosis Date Noted   Acute on chronic respiratory failure with hypoxia (HCC) 09/08/2021   Traumatic fracture of ribs of right side with pneumothorax  09/08/2021   History of alcohol abuse 09/08/2021   Delirium due to another medical condition 09/08/2021   Healthcare associated bacterial pneumonia 09/08/2021   Anxiety disorder due to known physiological condition    Fall 07/27/2021   Screening for prostate cancer 01/27/2021   Elevated LFTs 01/27/2021   Former smoker 01/27/2021   Chronic respiratory failure with hypoxia (HCC) 11/08/2020   Obstructive sleep apnea hypopnea, severe 09/24/2020   Exudative age-related macular degeneration of left eye with active choroidal neovascularization (HCC) 01/28/2020   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   Exudative age-related macular degeneration of right eye with active choroidal neovascularization (HCC) 01/28/2020   CAD (coronary artery disease) 10/16/2019   S/P reverse total shoulder arthroplasty, left 09/23/2019   Overweight (BMI 25.0-29.9) 08/13/2019   Hyperlipidemia 08/13/2019   Essential hypertension 08/13/2019   Gastroesophageal reflux disease 08/13/2019   Macular degeneration, wet (HCC) 11/09/2017   Lung nodules 10/28/2015   COPD mixed type (HCC) 10/01/2014   Chest x-ray abnormality 10/01/2014    REFERRING DIAG: pelvic fracture  THERAPY DIAG:  Chronic right-sided low back pain with sciatica, sciatica laterality unspecified  Muscle weakness (generalized)  Difficulty in walking, not elsewhere classified  PERTINENT HISTORY: ORIF Pelvic fx with percutaneous screws 07/29/21, reverse TSR Left,2021, tracheostomy 11/22 22, COPD, GERD , Hx of Hep C, HTN See medical history   SUBJECTIVE: Pt reports doing his home exercises daily since  his initial eval. He reports continued moderate to severe Rt LBP, sacral pain, and hip pain.  PAIN:  Are you having pain? Yes NPRS scale: 7/10 Pain location: Rt low back, sacrum, BIL hips PAIN TYPE: aching and dull Pain description: constant  Aggravating factors: prolonged sitting Relieving factors: Walking,  Tylenol     OBJECTIVE:   *Unless otherwise noted, objective data collected previously* DIAGNOSTIC FINDINGS:  IMPRESSION: 1. Comminuted bilateral pubic rami. Superior offset of the left superior ramus is stable from a recent CT but increased since December 2nd. 2. Stable bilateral sacral cannulated screws. No new osseous abnormality identified.   PATIENT SURVEYS:  FOTO 28% predicted 56%   SCREENING FOR RED FLAGS: Bowel or bladder incontinence: No Spinal tumors: No Cauda equina syndrome: No no numbness noted Compression fracture: No Abdominal aneurysm: No   COGNITION:          Overall cognitive status: Within functional limits for tasks assessed                        SENSATION:          Light touch: Appears intact          Stereognosis: Appears intact          Hot/Cold: Appears intact          Proprioception: Appears intact   MUSCLE LENGTH: Hamstrings: Right 55 deg pain; Left 68 deg Thomas test: Right 25 deg; Left 20 deg   POSTURE:  Noted bil tremors noted bil UE     PALPATION: Increased pain over bil lumbar paraspinal  TTP over R SI   LUMBARAROM/PROM   A/PROM A/PROM  10/21/2021  Flexion Mid shin  Extension 25%  Right lateral flexion Finger tip to knee jt line  Left lateral flexion 4 inch finger tip above knee jt line  Right rotation 60%  Left rotation 60%   (Blank rows = not tested)   LE AROM/PROM:   A/PROM Right 10/21/2021 Left 10/21/2021  Hip flexion 121 118  Hip extension      Hip abduction      Hip adduction      Hip internal rotation      Hip external rotation      Knee flexion 130 135  Knee extension -6 -3  Ankle dorsiflexion      Ankle plantarflexion      Ankle inversion      Ankle eversion       (Blank rows = not tested)   LE MMT: NT  with resistance due to pelvic fracture, standing to assess gross strength MMT Right 10/21/2021 Left 10/21/2021  Hip flexion 3-/5 3-/5  Hip extension 3-/5 3-/5  Hip abduction 3-/5 3-/5  Hip adduction       Hip internal rotation      Hip external rotation      Knee flexion      Knee extension 3-/5 3-/5  Ankle dorsiflexion      Ankle plantarflexion      Ankle inversion      Ankle eversion       (Blank rows = not tested)   LUMBAR SPECIAL TESTS:  Not tested due to pelvic fracture   FUNCTIONAL TESTS:  30 sec sit to stand 7 in 30 sec, unable to do 5 x STS must use hands   GAIT: Distance walked: 150 Assistive device utilized: Walker - 2 wheeled Level of assistance: Modified independence Comments: Pt gait velocity 1.83 ft/sec decreased  stride length. Pt walker adjusted for pt height       TODAY'S TREATMENT  [facility] Adult PT Treatment:                                                DATE: 10/26/2021 Therapeutic Exercise: Glute bridge 3x10 with 3-sec hold Supine SLR 2x10 with 3-sec hold BIL Supine 90/90 abdominal isometric with handhold resistance 5x20sec Standing heel raises 2x10 Manual Therapy: N/A Neuromuscular re-ed: N/A Therapeutic Activity: 4-inch step up/down in // bars 2x10 BIL Functional dead lifts with two 5# kettlebells in // bars 3x8 Modalities: N/A Self Care: N/A      PATIENT EDUCATION:  Education details: Updated HEP, educated on sleep hygiene techniques Person educated: Patient Education method: Explanation, Demonstration, Tactile cues, Verbal cues, and Handouts Education comprehension: verbalized understanding, returned demonstration, verbal cues required, and needs further education     HOME EXERCISE PROGRAM: Access Code: [email or web address] URL: [email or web address] Date: 10/21/2021 Prepared by: Garen Lah   Exercises Supine Single Knee to Chest Stretch - 2-3 x daily - 7 x weekly - 1 sets - 5 reps - 10 hold Supine Lower Trunk Rotation - 2-3 x daily - 7 x weekly - 1 sets - 5 reps - 20 hold Sit to Stand with Counter Support - 3 x daily - 7 x weekly - 1 sets - 10 reps  Added 10/26/2021: Supine Bridge - 1 x daily - 7 x weekly - 3 sets - 10 reps -  3-sec hold Active Straight Leg Raise with Quad Set - 1 x daily - 7 x weekly - 2 sets - 10 reps - 3-sec hold Abdominal Isometric Hold - FEET OFF TABLE* - 1 x daily - 7 x weekly - 3 sets - 20-sec hold     ASSESSMENT:   CLINICAL IMPRESSION: Pt responded well to initial exercises today, reporting that the exercises felt like a good challenge without increasing his pain levels. He was able to tolerate functional activities such as step-ups and dead lifts with good baseline core control. He will continue to benefit from skilled PT to address his primary impairments and return to his prior level of function with less limitation.          REHAB POTENTIAL: Good   CLINICAL DECISION MAKING: Evolving/moderate complexity   EVALUATION COMPLEXITY: Moderate     GOALS: Goals reviewed with patient? Yes partially   SHORT TERM GOALS:   STG Name Target Date Goal status  1 Pt will be independent with initial HEP Baseline: no knowledge 10/26/2021: Pt reports daily adherence to his HEP 11/18/2021 ACHIEVED  2 Pt will be able to perform Sit to stand with no use of UE Baseline: Pt requires heavy use of UE for rising from sit to stand 11/18/2021 INITIAL  3 Pt will be able to verbalize 3 points for sleep hygiene and implement Baseline:Pt wakes 4-5 times a night due to pain 10/26/2021: Discussed sleep hygiene techniques with pt 11/18/2021 PROGRESSING  4 Pt will use LRAD to walk for 6 MWT Baseline: unable to walk for longer than 5 minutes 11/18/2021 INITIAL    LONG TERM GOALS:    LTG Name Target Date Goal status  1 Pt will be independent with advanced HEP  Baseline: no knowledge 12/16/2021 INITIAL  2 Pt will be able to perform 5 X sts in 13 seconds or less for  decreased risk of fall and increased  LE MMT Baseline: 12/16/2021 INITIAL  3 Pt will be able to pick up 25 # of groceries from the ground wihtout exacerbating pain Baseline: unable to lift any amount due to pelvic fracture and pain 12/16/2021 INITIAL  4 Pt will  return to walking without  need AD for exercise for at least 1 mile  Baseline:Used to walk 5 miles a day but cannot walk for exercise 12/16/2021 INITIAL  5 Pt will be able to deadlift 75# in order to be able to carry materials for work related activities Baseline: unable to lift at this time 12/16/2021 INITIAL  6 Pt will be able to sleep through the night without waking due to turning for at least 4-5 hours  Baseline: wake every time he turns at night 12/16/2021 INITIAL  7 Pt will be able to walk in community for 2.62 Ft/sec velocity Baseline: eval 1.44ft/sec 12/16/2021 INITIAL    8 Pt will demonstrate ability climb ladder for return to work as well as rise from ground independently to show decrease in fall risk for work conditions Baseline: Pt learning to utilized walker to ambulate at eval   12/16/2021   INITAL    PLAN: PT FREQUENCY: 2x/week   PT DURATION: 8 weeks   PLANNED INTERVENTIONS: Therapeutic exercises, Therapeutic activity, Neuro Muscular re-education, Balance training, Gait training, Patient/Family education, Joint mobilization, Stair training, DME instructions, Aquatic Therapy, Dry Needling, Electrical stimulation, Spinal mobilization, Cryotherapy, Moist heat, Taping, Ionotophoresis 4mg /ml Dexamethasone, and Manual therapy   PLAN FOR NEXT SESSION: Review HEP and progress.. Gait training    Carmelina Dane, PT, DPT 10/26/21 11:34 AM

## 2021-10-28 ENCOUNTER — Ambulatory Visit: Payer: BC Managed Care – PPO

## 2021-11-03 NOTE — Therapy (Signed)
OUTPATIENT PHYSICAL THERAPY TREATMENT NOTE   Patient Name: [name] MRN: [id number] DOB:May 10, 1956, 66 y.o., male Today's Date: 11/04/2021  PCP: Shayne AlkenBrown, Stephanie Delores, MD REFERRING PROVIDER: Alease MedinaZiglar, Susan K, MD   PT End of Session - 11/04/21 1212     Visit Number 3    Number of Visits 16    Date for PT Re-Evaluation 12/16/21    Authorization Type [employer]    PT Start Time 1215    PT Stop Time 1300    PT Time Calculation (min) 45 min    Equipment Utilized During Treatment Gait belt    Activity Tolerance Patient tolerated treatment well    Behavior During Therapy WFL for tasks assessed/performed              Past Medical History:  Diagnosis Date   COPD (chronic obstructive pulmonary disease) (HCC)    GERD (gastroesophageal reflux disease)    History of hepatitis C    treated several years ago through [facility]   Hypertension    Past Surgical History:  Procedure Laterality Date   CATARACT EXTRACTION, BILATERAL     EYE SURGERY Bilateral    IR THORACENTESIS ASP PLEURAL SPACE W/IMG GUIDE  09/14/2021   IR THORACENTESIS ASP PLEURAL SPACE W/IMG GUIDE  09/21/2021   ORIF PELVIC FRACTURE WITH PERCUTANEOUS SCREWS Right 07/29/2021   Procedure: SI SCREW FIXATION OF RIGHT PELVIC RING;  Surgeon: Myrene GalasHandy, Michael, MD;  Location: [facility] OR;  Service: Orthopedics;  Laterality: Right;   ORTHOPEDIC SURGERY Bilateral    pt states fracture repairs to arms and legs.   REVERSE SHOULDER ARTHROPLASTY Left 09/23/2019   Procedure: REVERSE SHOULDER ARTHROPLASTY;  Surgeon: Jones Broomhandler, Justin, MD;  Location: [facility];  Service: Orthopedics;  Laterality: Left;   TRACHEOSTOMY TUBE PLACEMENT N/A 08/10/2021   Procedure: TRACHEOSTOMY;  Surgeon: Violeta Gelinashompson, Burke, MD;  [facility];  Service: General;  Laterality: N/A;   Patient Active Problem List   Diagnosis Date Noted   Acute on chronic respiratory failure with hypoxia (HCC) 09/08/2021   Traumatic fracture of ribs of right side with pneumothorax 09/08/2021    History of alcohol abuse 09/08/2021   Delirium due to another medical condition 09/08/2021   Healthcare associated bacterial pneumonia 09/08/2021   Anxiety disorder due to known physiological condition    Fall 07/27/2021   Screening for prostate cancer 01/27/2021   Elevated LFTs 01/27/2021   Former smoker 01/27/2021   Chronic respiratory failure with hypoxia (HCC) 11/08/2020   Obstructive sleep apnea hypopnea, severe 09/24/2020   Exudative age-related macular degeneration of left eye with active choroidal neovascularization (HCC) 01/28/2020   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   Exudative age-related macular degeneration of right eye with active choroidal neovascularization (HCC) 01/28/2020   CAD (coronary artery disease) 10/16/2019   S/P reverse total shoulder arthroplasty, left 09/23/2019   Overweight (BMI 25.0-29.9) 08/13/2019   Hyperlipidemia 08/13/2019   Essential hypertension 08/13/2019   Gastroesophageal reflux disease 08/13/2019   Macular degeneration, wet (HCC) 11/09/2017   Lung nodules 10/28/2015   COPD mixed type (HCC) 10/01/2014   Chest x-ray abnormality 10/01/2014    REFERRING DIAG: pelvic fracture  THERAPY DIAG:  Chronic right-sided low back pain with sciatica, sciatica laterality unspecified  Muscle weakness (generalized)  Difficulty in walking, not elsewhere classified  PERTINENT HISTORY: ORIF Pelvic fx with percutaneous screws 07/29/21, reverse TSR Left,2021, tracheostomy 11/22 22, COPD, GERD , Hx of Hep C, HTN See medical history   SUBJECTIVE: Pt reports that he is doing  well today, adding that his home exercises have been going well. He rates his pain as a 3-4/10 today. He also reports that he had a follow-up with his orthopedic surgeon yesterday, who gave him a positive report.  PAIN:  Are you having pain? Yes NPRS scale: 3-4/10 Pain location: Rt low back, sacrum, BIL hips PAIN TYPE: aching and dull Pain description: constant   Aggravating factors: prolonged sitting Relieving factors: Walking, Tylenol     OBJECTIVE:   *Unless otherwise noted, objective data collected previously* DIAGNOSTIC FINDINGS:  IMPRESSION: 1. Comminuted bilateral pubic rami. Superior offset of the left superior ramus is stable from a recent CT but increased since December 2nd. 2. Stable bilateral sacral cannulated screws. No new osseous abnormality identified.   PATIENT SURVEYS:  FOTO 28% predicted 56%   SCREENING FOR RED FLAGS: Bowel or bladder incontinence: No Spinal tumors: No Cauda equina syndrome: No no numbness noted Compression fracture: No Abdominal aneurysm: No   COGNITION:          Overall cognitive status: Within functional limits for tasks assessed                        SENSATION:          Light touch: Appears intact          Stereognosis: Appears intact          Hot/Cold: Appears intact          Proprioception: Appears intact   MUSCLE LENGTH: Hamstrings: Right 55 deg pain; Left 68 deg Thomas test: Right 25 deg; Left 20 deg   POSTURE:  Noted bil tremors noted bil UE     PALPATION: Increased pain over bil lumbar paraspinal  TTP over R SI   LUMBARAROM/PROM   A/PROM A/PROM  10/21/2021  Flexion Mid shin  Extension 25%  Right lateral flexion Finger tip to knee jt line  Left lateral flexion 4 inch finger tip above knee jt line  Right rotation 60%  Left rotation 60%   (Blank rows = not tested)   LE AROM/PROM:   A/PROM Right 10/21/2021 Left 10/21/2021  Hip flexion 121 118  Hip extension      Hip abduction      Hip adduction      Hip internal rotation      Hip external rotation      Knee flexion 130 135  Knee extension -6 -3  Ankle dorsiflexion      Ankle plantarflexion      Ankle inversion      Ankle eversion       (Blank rows = not tested)   LE MMT: NT  with resistance due to pelvic fracture, standing to assess gross strength MMT Right 10/21/2021 Left 10/21/2021  Hip flexion 3-/5 3-/5   Hip extension 3-/5 3-/5  Hip abduction 3-/5 3-/5  Hip adduction      Hip internal rotation      Hip external rotation      Knee flexion      Knee extension 3-/5 3-/5  Ankle dorsiflexion      Ankle plantarflexion      Ankle inversion      Ankle eversion       (Blank rows = not tested)   LUMBAR SPECIAL TESTS:  Not tested due to pelvic fracture   FUNCTIONAL TESTS:  30 sec sit to stand 7 in 30 sec, unable to do 5 x STS must use hands  11/04/2021: :  69ft, 1.69 ft/sec gait speed   GAIT: Distance walked: 150 Assistive device utilized: Walker - 2 wheeled Level of assistance: Modified independence Comments: Pt gait velocity 1.83 ft/sec decreased stride length. Pt walker adjusted for pt height       TODAY'S TREATMENT   [facility] Adult PT Treatment:                                                DATE: 11/04/2021 Therapeutic Exercise: N/A Manual Therapy: N/A Neuromuscular re-ed: Standing weight shifting on Airex pad 2x10 forward, backward, left, right Ambulation in // bars, UE support only as needed while stepping over rolled towels 3x2 laps Therapeutic Activity: Forward step-up, retro step-down on 4-inch box 2x10 BIL Sumo squat side steps in // bars 3x2 laps Functional dead lifts with two 10# kettlebells in // bars 3x8 Modalities: N/A Self Care: N/A   [facility] Adult PT Treatment:                                                DATE: 10/26/2021 Therapeutic Exercise: Glute bridge 3x10 with 3-sec hold Supine SLR 2x10 with 3-sec hold BIL Supine 90/90 abdominal isometric with handhold resistance 5x20sec Standing heel raises 2x10 Manual Therapy: N/A Neuromuscular re-ed: N/A Therapeutic Activity: 4-inch step up/down in // bars 2x10 BIL Functional dead lifts with two 5# kettlebells in // bars 3x8 Modalities: N/A Self Care: N/A      PATIENT EDUCATION:  Education details: Updated HEP, educated on sleep hygiene techniques Person educated: Patient Education method:  Explanation, Demonstration, Tactile cues, Verbal cues, and Handouts Education comprehension: verbalized understanding, returned demonstration, verbal cues required, and needs further education     HOME EXERCISE PROGRAM: Access Code: [email or web address] URL: [email or web address] Date: 10/21/2021 Prepared by: Garen Lah   Exercises Supine Single Knee to Chest Stretch - 2-3 x daily - 7 x weekly - 1 sets - 5 reps - 10 hold Supine Lower Trunk Rotation - 2-3 x daily - 7 x weekly - 1 sets - 5 reps - 20 hold Sit to Stand with Counter Support - 3 x daily - 7 x weekly - 1 sets - 10 reps  Added 10/26/2021: Supine Bridge - 1 x daily - 7 x weekly - 3 sets - 10 reps - 3-sec hold Active Straight Leg Raise with Quad Set - 1 x daily - 7 x weekly - 2 sets - 10 reps - 3-sec hold Abdominal Isometric Hold - FEET OFF TABLE* - 1 x daily - 7 x weekly - 3 sets - 20-sec hold     ASSESSMENT:   CLINICAL IMPRESSION: Pt responded well to all new exercises today, demonstrating good form and no increase in pain. Exercises today focused on standing functional movements and mobility. Of note, he was able to accomplish a today with his FWW and without a seated rest; he completed 610 ft. He will continue to benefit from skilled PT to address his primary impairments and return to his prior level of function with less limitation.          REHAB POTENTIAL: Good   CLINICAL DECISION MAKING: Evolving/moderate complexity   EVALUATION COMPLEXITY: Moderate     GOALS: Goals reviewed with patient? Yes partially  SHORT TERM GOALS:   STG Name Target Date Goal status  1 Pt will be independent with initial HEP Baseline: no knowledge 10/26/2021: Pt reports daily adherence to his HEP 11/18/2021 ACHIEVED  2 Pt will be able to perform Sit to stand with no use of UE Baseline: Pt requires heavy use of UE for rising from sit to stand 11/04/2021: Pt able to stand with hands on knees 11/18/2021 PROGRESSING  3 Pt will be  able to verbalize 3 points for sleep hygiene and implement Baseline:Pt wakes 4-5 times a night due to pain 10/26/2021: Discussed sleep hygiene techniques with pt 11/18/2021 PROGRESSING  4 Pt will use LRAD to walk for 6 MWT Baseline: unable to walk for longer than 5 minutes 11/04/2021: completed: 610 ft 11/18/2021 ACHIEVED    LONG TERM GOALS:    LTG Name Target Date Goal status  1 Pt will be independent with advanced HEP  Baseline: no knowledge 12/16/2021 INITIAL  2 Pt will be able to perform 5 X sts in 13 seconds or less for decreased risk of fall and increased  LE MMT Baseline: 12/16/2021 INITIAL  3 Pt will be able to pick up 25 # of groceries from the ground wihtout exacerbating pain Baseline: unable to lift any amount due to pelvic fracture and pain 12/16/2021 INITIAL  4 Pt will return to walking without  need AD for exercise for at least 1 mile  Baseline:Used to walk 5 miles a day but cannot walk for exercise 12/16/2021 INITIAL  5 Pt will be able to deadlift 75# in order to be able to carry materials for work related activities Baseline: unable to lift at this time 12/16/2021 INITIAL  6 Pt will be able to sleep through the night without waking due to turning for at least 4-5 hours  Baseline: wake every time he turns at night 12/16/2021 INITIAL  7 Pt will be able to walk in community for 2.62 Ft/sec velocity Baseline: eval 1.28ft/sec 12/16/2021 INITIAL    8 Pt will demonstrate ability climb ladder for return to work as well as rise from ground independently to show decrease in fall risk for work conditions Baseline: Pt learning to utilized walker to ambulate at eval   12/16/2021   INITAL    PLAN: PT FREQUENCY: 2x/week   PT DURATION: 8 weeks   PLANNED INTERVENTIONS: Therapeutic exercises, Therapeutic activity, Neuro Muscular re-education, Balance training, Gait training, Patient/Family education, Joint mobilization, Stair training, DME instructions, Aquatic Therapy, Dry Needling,  Electrical stimulation, Spinal mobilization, Cryotherapy, Moist heat, Taping, Ionotophoresis 4mg /ml Dexamethasone, and Manual therapy   PLAN FOR NEXT SESSION: Review HEP and progress. Gait training    , PT, DPT 11/04/21 12:59 PM

## 2021-11-04 ENCOUNTER — Ambulatory Visit: Payer: BC Managed Care – PPO

## 2021-11-04 ENCOUNTER — Ambulatory Visit: Payer: BC Managed Care – PPO | Admitting: Physical Therapy

## 2021-11-04 ENCOUNTER — Other Ambulatory Visit: Payer: Self-pay

## 2021-11-04 DIAGNOSIS — M5440 Lumbago with sciatica, unspecified side: Secondary | ICD-10-CM | POA: Diagnosis not present

## 2021-11-04 DIAGNOSIS — M6281 Muscle weakness (generalized): Secondary | ICD-10-CM

## 2021-11-04 DIAGNOSIS — R262 Difficulty in walking, not elsewhere classified: Secondary | ICD-10-CM

## 2021-11-04 DIAGNOSIS — G8929 Other chronic pain: Secondary | ICD-10-CM

## 2021-11-11 ENCOUNTER — Other Ambulatory Visit: Payer: Self-pay

## 2021-11-11 ENCOUNTER — Encounter: Payer: Self-pay | Admitting: Physical Therapy

## 2021-11-11 ENCOUNTER — Ambulatory Visit: Payer: BC Managed Care – PPO | Admitting: Physical Therapy

## 2021-11-11 DIAGNOSIS — M5440 Lumbago with sciatica, unspecified side: Secondary | ICD-10-CM

## 2021-11-11 DIAGNOSIS — G8929 Other chronic pain: Secondary | ICD-10-CM

## 2021-11-11 DIAGNOSIS — M6281 Muscle weakness (generalized): Secondary | ICD-10-CM

## 2021-11-11 DIAGNOSIS — R262 Difficulty in walking, not elsewhere classified: Secondary | ICD-10-CM

## 2021-11-11 NOTE — Therapy (Signed)
OUTPATIENT PHYSICAL THERAPY TREATMENT NOTE   Patient Name: [name] MRN: [id number] DOB:[birth year], 66 y.o., male Today's Date: 11/11/2021  PCP: Shayne Alken, MD REFERRING PROVIDER: Alease Medina, MD   PT End of Session - 11/11/21 1102     Visit Number 4    Number of Visits 16    Date for PT Re-Evaluation 12/16/21    Authorization Type [employer]    PT Start Time 1102    PT Stop Time 1148    PT Time Calculation (min) 46 min    Equipment Utilized During Treatment Gait belt    Activity Tolerance Patient tolerated treatment well    Behavior During Therapy WFL for tasks assessed/performed               Past Medical History:  Diagnosis Date   COPD (chronic obstructive pulmonary disease) (HCC)    GERD (gastroesophageal reflux disease)    History of hepatitis C    treated several years ago through [facility]   Hypertension    Past Surgical History:  Procedure Laterality Date   CATARACT EXTRACTION, BILATERAL     EYE SURGERY Bilateral    IR THORACENTESIS ASP PLEURAL SPACE W/IMG GUIDE  09/14/2021   IR THORACENTESIS ASP PLEURAL SPACE W/IMG GUIDE  09/21/2021   ORIF PELVIC FRACTURE WITH PERCUTANEOUS SCREWS Right 07/29/2021   Procedure: SI SCREW FIXATION OF RIGHT PELVIC RING;  Surgeon: Myrene Galas, MD;  Location: [facility] OR;  Service: Orthopedics;  Laterality: Right;   ORTHOPEDIC SURGERY Bilateral    pt states fracture repairs to arms and legs.   REVERSE SHOULDER ARTHROPLASTY Left 09/23/2019   Procedure: REVERSE SHOULDER ARTHROPLASTY;  Surgeon: Jones Broom, MD;  Location: [facility];  Service: Orthopedics;  Laterality: Left;   TRACHEOSTOMY TUBE PLACEMENT N/A 08/10/2021   Procedure: TRACHEOSTOMY;  Surgeon: Violeta Gelinas, MD;  Location: [facility] OR;  Service: General;  Laterality: N/A;   Patient Active Problem List   Diagnosis Date Noted   Acute on chronic respiratory failure with hypoxia (HCC) 09/08/2021   Traumatic fracture of ribs of right side with pneumothorax  09/08/2021   History of alcohol abuse 09/08/2021   Delirium due to another medical condition 09/08/2021   Healthcare associated bacterial pneumonia 09/08/2021   Anxiety disorder due to known physiological condition    Fall 07/27/2021   Screening for prostate cancer 01/27/2021   Elevated LFTs 01/27/2021   Former smoker 01/27/2021   Chronic respiratory failure with hypoxia (HCC) 11/08/2020   Obstructive sleep apnea hypopnea, severe 09/24/2020   Exudative age-related macular degeneration of left eye with active choroidal neovascularization (HCC) 01/28/2020   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   Exudative age-related macular degeneration of right eye with active choroidal neovascularization (HCC) 01/28/2020   CAD (coronary artery disease) 10/16/2019   S/P reverse total shoulder arthroplasty, left 09/23/2019   Overweight (BMI 25.0-29.9) 08/13/2019   Hyperlipidemia 08/13/2019   Essential hypertension 08/13/2019   Gastroesophageal reflux disease 08/13/2019   Macular degeneration, wet (HCC) 11/09/2017   Lung nodules 10/28/2015   COPD mixed type (HCC) 10/01/2014   Chest x-ray abnormality 10/01/2014    REFERRING DIAG: pelvic fracture  THERAPY DIAG:  Chronic right-sided low back pain with sciatica, sciatica laterality unspecified  Muscle weakness (generalized)  Difficulty in walking, not elsewhere classified  PERTINENT HISTORY: ORIF Pelvic fx with percutaneous screws 07/29/21, reverse TSR Left,2021, tracheostomy 11/22 22, COPD, GERD , Hx of Hep C, HTN See medical history   SUBJECTIVE: Pt reports that he  was doing well.  Says he doesn't know what happened but his left hip is a 7/10 pain today.  No pain in his back He reports that he feels stronger walking now and it doesn't hurt when he walks and when he sits down.   He also reports that he had a follow-up with his orthopedic surgeon yesterday, who gave him a positive report.  PAIN:  Are you having pain? Yes NPRS  scale: 7/10 on left hip today.  LOw back is 0/10 today Pain location: Rt low back, sacrum, BIL hips PAIN TYPE: aching and dull Pain description: constant  Aggravating factors: prolonged sitting Relieving factors: Walking, Tylenol     OBJECTIVE:   *Unless otherwise noted, objective data collected previously* DIAGNOSTIC FINDINGS:  IMPRESSION: 1. Comminuted bilateral pubic rami. Superior offset of the left superior ramus is stable from a recent CT but increased since December 2nd. 2. Stable bilateral sacral cannulated screws. No new osseous abnormality identified.   PATIENT SURVEYS:  FOTO 28% predicted 56%   SCREENING FOR RED FLAGS: Bowel or bladder incontinence: No Spinal tumors: No Cauda equina syndrome: No no numbness noted Compression fracture: No Abdominal aneurysm: No   COGNITION:          Overall cognitive status: Within functional limits for tasks assessed                        SENSATION:          Light touch: Appears intact          Stereognosis: Appears intact          Hot/Cold: Appears intact          Proprioception: Appears intact   MUSCLE LENGTH: Hamstrings: Right 55 deg pain; Left 68 deg Thomas test: Right 25 deg; Left 20 deg   POSTURE:  Noted bil tremors noted bil UE     PALPATION: Increased pain over bil lumbar paraspinal  TTP over R SI   LUMBARAROM/PROM   A/PROM A/PROM  10/21/2021  Flexion Mid shin  Extension 25%  Right lateral flexion Finger tip to knee jt line  Left lateral flexion 4 inch finger tip above knee jt line  Right rotation 60%  Left rotation 60%   (Blank rows = not tested)   LE AROM/PROM:   A/PROM Right 10/21/2021 Left 10/21/2021  Hip flexion 121 118  Hip extension      Hip abduction      Hip adduction      Hip internal rotation      Hip external rotation      Knee flexion 130 135  Knee extension -6 -3  Ankle dorsiflexion      Ankle plantarflexion      Ankle inversion      Ankle eversion       (Blank rows = not  tested)   LE MMT: NT  with resistance due to pelvic fracture, standing to assess gross strength MMT Right 10/21/2021 Left 10/21/2021  Hip flexion 3-/5 3-/5  Hip extension 3-/5 3-/5  Hip abduction 3-/5 3-/5  Hip adduction      Hip internal rotation      Hip external rotation      Knee flexion      Knee extension 3-/5 3-/5  Ankle dorsiflexion      Ankle plantarflexion      Ankle inversion      Ankle eversion       (Blank rows = not tested)  LUMBAR SPECIAL TESTS:  Not tested due to pelvic fracture   FUNCTIONAL TESTS:  30 sec sit to stand 7 in 30 sec, unable to do 5 x STS must use hands  11/04/2021: 6MWT: 6410ft, 1.69 ft/sec gait speed   GAIT: Distance walked: 150 Assistive device utilized: Walker - 2 wheeled Level of assistance: Modified independence Comments: Pt gait velocity 1.83 ft/sec decreased stride length. Pt walker adjusted for pt height       TODAY'S TREATMENT   [facility] Adult PT Treatment:                                                DATE: 11-11-21 Therapeutic Exercise: N/A Manual Therapy:  PT manual stretch of Left hip PT assist with bridging with sheet for added hip extension; PT assist with overpressure with trunk rotation.  Neuromuscular re-ed: Standing weight shifting on Airex pad 2x10 forward, backward, left, right Standing on Airex pad with Pt providing perturbations to challenge balance Ambulation in // bars, UE support only as needed 90 degree  hip flexion march steps in llbar to step over cones , initially stepping on cones improving with practice  8 times the ll bar length Therapeutic Activity: Forward step-up, retro step-down on 6 -inch box 2x10 BIL Sumo squat side steps in // bars 3x2 laps VC for deeper squats Functional dead lifts with two 10# kettlebells in // bars 3x8, between sets sitting piriformis stretch 3 x 20 sec STS  x 10 with 10 # and OH reach to PT  [facility]                                                DATE:  11/04/2021 Therapeutic Exercise: N/A Manual Therapy: N/A Neuromuscular re-ed: Standing weight shifting on Airex pad 2x10 forward, backward, left, right Ambulation in // bars, UE support only as needed while stepping over rolled towels 3x2 laps Therapeutic Activity: Forward step-up, retro step-down on 4-inch box 2x10 BIL Sumo squat side steps in // bars 3x2 laps 6MWT Functional dead lifts with two 10# kettlebells in // bars 3x8 Modalities: N/A Self Care: N/A   [facility]                                                DATE: 10/26/2021 Therapeutic Exercise: Glute bridge 3x10 with 3-sec hold Supine SLR 2x10 with 3-sec hold BIL Supine 90/90 abdominal isometric with handhold resistance 5x20sec Standing heel raises 2x10 Manual Therapy: N/A Neuromuscular re-ed: N/A Therapeutic Activity: 4-inch step up/down in // bars 2x10 BIL Functional dead lifts with two 5# kettlebells in // bars 3x8 Modalities: N/A Self Care: N/A      PATIENT EDUCATION:  Education details: Updated HEP, educated on sleep hygiene techniques Person educated: Patient Education method: Explanation, Demonstration, Tactile cues, Verbal cues, and Handouts Education comprehension: verbalized understanding, returned demonstration, verbal cues required, and needs further education     HOME EXERCISE PROGRAM: Access Code: [email or web address] URL: [email or web address] Date: 10/21/2021 Prepared by: Garen LahLawrie Audry Kauzlarich   Exercises Supine Single Knee to Chest Stretch - 2-3 x daily -  7 x weekly - 1 sets - 5 reps - 10 hold Supine Lower Trunk Rotation - 2-3 x daily - 7 x weekly - 1 sets - 5 reps - 20 hold Sit to Stand with Counter Support - 3 x daily - 7 x weekly - 1 sets - 10 reps  Added 10/26/2021: Supine Bridge - 1 x daily - 7 x weekly - 3 sets - 10 reps - 3-sec hold Active Straight Leg Raise with Quad Set - 1 x daily - 7 x weekly - 2 sets - 10 reps - 3-sec hold Abdominal Isometric Hold - FEET OFF  TABLE* - 1 x daily - 7 x weekly - 3 sets - 20-sec hold     ASSESSMENT:   CLINICAL IMPRESSION: Pt reinforced exercise performed last RX.   Pt with left hip pain initially 7/10 but reduced with exercise and sitting piriformis stretch. To 2 or 3/10.  Worked on increasing hip flexion with ambulation and balance in ll bars.  Pt needs VC and TC for increasing hip movement for hip hinge for functional deadlift.  Pt with increased tone with overpressure and stretching at end of session. Exercises today focused on standing functional movements and mobilityHe will continue to benefit from skilled PT to address his primary impairments and return to his prior level of function with less limitation.          REHAB POTENTIAL: Good   CLINICAL DECISION MAKING: Evolving/moderate complexity   EVALUATION COMPLEXITY: Moderate     GOALS: Goals reviewed with patient? Yes partially   SHORT TERM GOALS:   STG Name Target Date Goal status  1 Pt will be independent with initial HEP Baseline: no knowledge 10/26/2021: Pt reports daily adherence to his HEP 11/18/2021 ACHIEVED  2 Pt will be able to perform Sit to stand with no use of UE Baseline: Pt requires heavy use of UE for rising from sit to stand 11/04/2021: Pt able to stand with hands on knees 11/18/2021 PROGRESSING  3 Pt will be able to verbalize 3 points for sleep hygiene and implement Baseline:Pt wakes 4-5 times a night due to pain 10/26/2021: Discussed sleep hygiene techniques with pt 11/18/2021 PROGRESSING  4 Pt will use LRAD to walk for 6 MWT Baseline: unable to walk for longer than 5 minutes 11/04/2021: completed: 610 ft 11/18/2021 ACHIEVED    LONG TERM GOALS:    LTG Name Target Date Goal status  1 Pt will be independent with advanced HEP  Baseline: no knowledge 12/16/2021 INITIAL  2 Pt will be able to perform 5 X sts in 13 seconds or less for decreased risk of fall and increased  LE MMT Baseline: 12/16/2021 INITIAL  3 Pt will be able to pick up 25 #  of groceries from the ground wihtout exacerbating pain Baseline: unable to lift any amount due to pelvic fracture and pain 12/16/2021 INITIAL  4 Pt will return to walking without  need AD for exercise for at least 1 mile  Baseline:Used to walk 5 miles a day but cannot walk for exercise 12/16/2021 INITIAL  5 Pt will be able to deadlift 75# in order to be able to carry materials for work related activities Baseline: unable to lift at this time 12/16/2021 INITIAL  6 Pt will be able to sleep through the night without waking due to turning for at least 4-5 hours  Baseline: wake every time he turns at night 12/16/2021 INITIAL  7 Pt will be able to walk in community for  2.62 Ft/sec velocity Baseline: eval 1.5683ft/sec 12/16/2021 INITIAL    8 Pt will demonstrate ability climb ladder for return to work as well as rise from ground independently to show decrease in fall risk for work conditions Baseline: Pt learning to utilized walker to ambulate at eval   12/16/2021   INITAL    PLAN: PT FREQUENCY: 2x/week   PT DURATION: 8 weeks   PLANNED INTERVENTIONS: Therapeutic exercises, Therapeutic activity, Neuro Muscular re-education, Balance training, Gait training, Patient/Family education, Joint mobilization, Stair training, DME instructions, Aquatic Therapy, Dry Needling, Electrical stimulation, Spinal mobilization, Cryotherapy, Moist heat, Taping, Ionotophoresis 4mg /ml Dexamethasone, and Manual therapy   PLAN FOR NEXT SESSION: Review HEP and progress. Gait training Goals   Garen LahLawrie Eulas Schweitzer, PT, [facility] Certified Exercise Expert for the Aging Adult  11/11/21 11:48 AM Phone: [phone] Fax: [phone]

## 2021-11-17 NOTE — Therapy (Signed)
OUTPATIENT PHYSICAL THERAPY TREATMENT NOTE   Patient Name: [name] MRN: [id number] DOB:[birth year], 66 y.o., male Today's Date: 11/18/2021  PCP: Shayne Alken, MD REFERRING PROVIDER: Alease Medina, MD   PT End of Session - 11/18/21 1300     Visit Number 5    Number of Visits 16    Date for PT Re-Evaluation 12/16/21    Authorization Type [employer]    PT Start Time 1300    PT Stop Time 1345    PT Time Calculation (min) 45 min    Equipment Utilized During Treatment Gait belt    Activity Tolerance Patient tolerated treatment well    Behavior During Therapy WFL for tasks assessed/performed                Past Medical History:  Diagnosis Date   COPD (chronic obstructive pulmonary disease) (HCC)    GERD (gastroesophageal reflux disease)    History of hepatitis C    treated several years ago through [facility]   Hypertension    Past Surgical History:  Procedure Laterality Date   CATARACT EXTRACTION, BILATERAL     EYE SURGERY Bilateral    IR THORACENTESIS ASP PLEURAL SPACE W/IMG GUIDE  09/14/2021   IR THORACENTESIS ASP PLEURAL SPACE W/IMG GUIDE  09/21/2021   ORIF PELVIC FRACTURE WITH PERCUTANEOUS SCREWS Right 07/29/2021   Procedure: SI SCREW FIXATION OF RIGHT PELVIC RING;  Surgeon: Myrene Galas, MD;  Location: [facility] OR;  Service: Orthopedics;  Laterality: Right;   ORTHOPEDIC SURGERY Bilateral    pt states fracture repairs to arms and legs.   REVERSE SHOULDER ARTHROPLASTY Left 09/23/2019   Procedure: REVERSE SHOULDER ARTHROPLASTY;  Surgeon: Jones Broom, MD;  Location: [facility];  Service: Orthopedics;  Laterality: Left;   TRACHEOSTOMY TUBE PLACEMENT N/A 08/10/2021   Procedure: TRACHEOSTOMY;  Surgeon: Violeta Gelinas, MD;  Location: [facility] OR;  Service: General;  Laterality: N/A;   Patient Active Problem List   Diagnosis Date Noted   Acute on chronic respiratory failure with hypoxia (HCC) 09/08/2021   Traumatic fracture of ribs of right side with pneumothorax  09/08/2021   History of alcohol abuse 09/08/2021   Delirium due to another medical condition 09/08/2021   Healthcare associated bacterial pneumonia 09/08/2021   Anxiety disorder due to known physiological condition    Fall 07/27/2021   Screening for prostate cancer 01/27/2021   Elevated LFTs 01/27/2021   Former smoker 01/27/2021   Chronic respiratory failure with hypoxia (HCC) 11/08/2020   Obstructive sleep apnea hypopnea, severe 09/24/2020   Exudative age-related macular degeneration of left eye with active choroidal neovascularization (HCC) 01/28/2020   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   Exudative age-related macular degeneration of right eye with active choroidal neovascularization (HCC) 01/28/2020   CAD (coronary artery disease) 10/16/2019   S/P reverse total shoulder arthroplasty, left 09/23/2019   Overweight (BMI 25.0-29.9) 08/13/2019   Hyperlipidemia 08/13/2019   Essential hypertension 08/13/2019   Gastroesophageal reflux disease 08/13/2019   Macular degeneration, wet (HCC) 11/09/2017   Lung nodules 10/28/2015   COPD mixed type (HCC) 10/01/2014   Chest x-ray abnormality 10/01/2014    REFERRING DIAG: pelvic fracture  THERAPY DIAG:  Chronic right-sided low back pain with sciatica, sciatica laterality unspecified  Muscle weakness (generalized)  Difficulty in walking, not elsewhere classified  PERTINENT HISTORY: ORIF Pelvic fx with percutaneous screws 07/29/21, reverse TSR Left,2021, tracheostomy 11/22 22, COPD, GERD , Hx of Hep C, HTN See medical history   SUBJECTIVE: Pt reports continued low  level Lt hip pain today, although he reports no LBP currently. He reports being adherent to his HEP.   PAIN:  Are you having pain? Yes NPRS scale: 2/10 on left hip today.  Low back is 0/10 today Pain location: Rt low back, sacrum, BIL hips PAIN TYPE: aching and dull Pain description: constant  Aggravating factors: prolonged sitting Relieving factors:  Walking, Tylenol     OBJECTIVE:   *Unless otherwise noted, objective data collected previously* DIAGNOSTIC FINDINGS:  IMPRESSION: 1. Comminuted bilateral pubic rami. Superior offset of the left superior ramus is stable from a recent CT but increased since December 2nd. 2. Stable bilateral sacral cannulated screws. No new osseous abnormality identified.   PATIENT SURVEYS:  FOTO 28% predicted 56%   SCREENING FOR RED FLAGS: Bowel or bladder incontinence: No Spinal tumors: No Cauda equina syndrome: No no numbness noted Compression fracture: No Abdominal aneurysm: No   COGNITION:          Overall cognitive status: Within functional limits for tasks assessed                        SENSATION:          Light touch: Appears intact          Stereognosis: Appears intact          Hot/Cold: Appears intact          Proprioception: Appears intact   MUSCLE LENGTH: Hamstrings: Right 55 deg pain; Left 68 deg Thomas test: Right 25 deg; Left 20 deg   POSTURE:  Noted bil tremors noted bil UE     PALPATION: Increased pain over bil lumbar paraspinal  TTP over R SI   LUMBARAROM/PROM   A/PROM A/PROM  10/21/2021  Flexion Mid shin  Extension 25%  Right lateral flexion Finger tip to knee jt line  Left lateral flexion 4 inch finger tip above knee jt line  Right rotation 60%  Left rotation 60%   (Blank rows = not tested)   LE AROM/PROM:   A/PROM Right 10/21/2021 Left 10/21/2021  Hip flexion 121 118  Hip extension      Hip abduction      Hip adduction      Hip internal rotation      Hip external rotation      Knee flexion 130 135  Knee extension -6 -3  Ankle dorsiflexion      Ankle plantarflexion      Ankle inversion      Ankle eversion       (Blank rows = not tested)   LE MMT: NT  with resistance due to pelvic fracture, standing to assess gross strength MMT Right 10/21/2021 Left 10/21/2021 Right 11/18/2021 Left 11/18/2021  Hip flexion 3-/5 3-/5 5/5 5/5  Hip extension 3-/5 3-/5  3+/5 4/5  Hip abduction 3-/5 3-/5 4/5 3+/5  Hip adduction        Hip internal rotation        Hip external rotation        Knee flexion        Knee extension 3-/5 3-/5 5/5 4+/5  Ankle dorsiflexion        Ankle plantarflexion        Ankle inversion        Ankle eversion         (Blank rows = not tested)   LUMBAR SPECIAL TESTS:  Not tested due to pelvic fracture   FUNCTIONAL TESTS:  30 sec  sit to stand 7 in 30 sec, unable to do 5 x STS must use hands  11/04/2021: 6MWT: 64410ft, 1.69 ft/sec gait speed   GAIT: Distance walked: 150 Assistive device utilized: Walker - 2 wheeled Level of assistance: Modified independence Comments: Pt gait velocity 1.83 ft/sec decreased stride length. Pt walker adjusted for pt height       TODAY'S TREATMENT   [facility] Adult PT Treatment:                                                DATE: 11/18/2021 Therapeutic Exercise: Cybex standing hip abduction with 17.5# cable 2x10 BIL Cybex standing hip extension with 17.5# cable 2x10 BIL Manual Therapy: N/A Neuromuscular re-ed: Tandem walking in // bars with lateral PT perturbations x4 laps Standing balance clocks on Airex pad 2x5 cycles BIL Therapeutic Activity: 6-inch step up with knee drive and slow retro step down 2x10 BIL Dead lifts with two 15# dumbbells 3x10 Sumo squat side steps in // bars 2x3 laps Modalities: N/A Self Care: N/A   [facility] Adult PT Treatment:                                                DATE: 11-11-21 Therapeutic Exercise: N/A Manual Therapy:  PT manual stretch of Left hip PT assist with bridging with sheet for added hip extension; PT assist with overpressure with trunk rotation.  Neuromuscular re-ed: Standing weight shifting on Airex pad 2x10 forward, backward, left, right Standing on Airex pad with Pt providing perturbations to challenge balance Ambulation in // bars, UE support only as needed 90 degree  hip flexion march steps in llbar to step over cones , initially  stepping on cones improving with practice  8 times the ll bar length Therapeutic Activity: Forward step-up, retro step-down on 6 -inch box 2x10 BIL Sumo squat side steps in // bars 3x2 laps VC for deeper squats Functional dead lifts with two 10# kettlebells in // bars 3x8, between sets sitting piriformis stretch 3 x 20 sec STS  x 10 with 10 # and OH reach to PT  [facility]                                                DATE: 11/04/2021 Therapeutic Exercise: N/A Manual Therapy: N/A Neuromuscular re-ed: Standing weight shifting on Airex pad 2x10 forward, backward, left, right Ambulation in // bars, UE support only as needed while stepping over rolled towels 3x2 laps Therapeutic Activity: Forward step-up, retro step-down on 4-inch box 2x10 BIL Sumo squat side steps in // bars 3x2 laps 6MWT Functional dead lifts with two 10# kettlebells in // bars 3x8 Modalities: N/A Self Care: N/A       PATIENT EDUCATION:  Education details: Updated HEP, educated on sleep hygiene techniques Person educated: Patient Education method: Explanation, Demonstration, Tactile cues, Verbal cues, and Handouts Education comprehension: verbalized understanding, returned demonstration, verbal cues required, and needs further education     HOME EXERCISE PROGRAM: Access Code: [email or web address] URL: [email or web address] Date: 10/21/2021 Prepared by: Garen LahLawrie Beardsley   Exercises Supine Single Knee to  Chest Stretch - 2-3 x daily - 7 x weekly - 1 sets - 5 reps - 10 hold Supine Lower Trunk Rotation - 2-3 x daily - 7 x weekly - 1 sets - 5 reps - 20 hold Sit to Stand with Counter Support - 3 x daily - 7 x weekly - 1 sets - 10 reps  Added 10/26/2021: Supine Bridge - 1 x daily - 7 x weekly - 3 sets - 10 reps - 3-sec hold Active Straight Leg Raise with Quad Set - 1 x daily - 7 x weekly - 2 sets - 10 reps - 3-sec hold Abdominal Isometric Hold - FEET OFF TABLE* - 1 x daily - 7 x weekly - 3 sets -  20-sec hold     ASSESSMENT:   CLINICAL IMPRESSION: Pt responded well to all interventions today, demonstrating good form and no increase in pain with selected exercises. He reports moderate fatigue at the end of the session, but overall performed well with higher-level balance and strengthening exercises today. He will continue to benefit from skilled PT to address his primary impairments and return to his prior level of function with less limitation.          REHAB POTENTIAL: Good   CLINICAL DECISION MAKING: Evolving/moderate complexity   EVALUATION COMPLEXITY: Moderate     GOALS: Goals reviewed with patient? Yes partially   SHORT TERM GOALS:   STG Name Target Date Goal status  1 Pt will be independent with initial HEP Baseline: no knowledge 10/26/2021: Pt reports daily adherence to his HEP 11/18/2021 ACHIEVED  2 Pt will be able to perform Sit to stand with no use of UE Baseline: Pt requires heavy use of UE for rising from sit to stand 11/04/2021: Pt able to stand with hands on knees 11/18/2021 PROGRESSING  3 Pt will be able to verbalize 3 points for sleep hygiene and implement Baseline:Pt wakes 4-5 times a night due to pain 10/26/2021: Discussed sleep hygiene techniques with pt 11/18/2021 PROGRESSING  4 Pt will use LRAD to walk for 6 MWT Baseline: unable to walk for longer than 5 minutes 11/04/2021: 6MWT completed: 610 ft 11/18/2021 ACHIEVED    LONG TERM GOALS:    LTG Name Target Date Goal status  1 Pt will be independent with advanced HEP  Baseline: no knowledge 12/16/2021 INITIAL  2 Pt will be able to perform 5 X sts in 13 seconds or less for decreased risk of fall and increased  LE MMT Baseline: 12/16/2021 INITIAL  3 Pt will be able to pick up 25 # of groceries from the ground wihtout exacerbating pain Baseline: unable to lift any amount due to pelvic fracture and pain 12/16/2021 INITIAL  4 Pt will return to walking without  need AD for exercise for at least 1 mile  Baseline:Used to  walk 5 miles a day but cannot walk for exercise 12/16/2021 INITIAL  5 Pt will be able to deadlift 75# in order to be able to carry materials for work related activities Baseline: unable to lift at this time 12/16/2021 INITIAL  6 Pt will be able to sleep through the night without waking due to turning for at least 4-5 hours  Baseline: wake every time he turns at night 12/16/2021 INITIAL  7 Pt will be able to walk in community for 2.62 Ft/sec velocity Baseline: eval 1.2183ft/sec 12/16/2021 INITIAL    8 Pt will demonstrate ability climb ladder for return to work as well as rise from ground independently to show  decrease in fall risk for work conditions Baseline: Pt learning to utilized walker to ambulate at eval   12/16/2021   INITAL    PLAN: PT FREQUENCY: 2x/week   PT DURATION: 8 weeks   PLANNED INTERVENTIONS: Therapeutic exercises, Therapeutic activity, Neuro Muscular re-education, Balance training, Gait training, Patient/Family education, Joint mobilization, Stair training, DME instructions, Aquatic Therapy, Dry Needling, Electrical stimulation, Spinal mobilization, Cryotherapy, Moist heat, Taping, Ionotophoresis 4mg /ml Dexamethasone, and Manual therapy   PLAN FOR NEXT SESSION: Review HEP and progress. Gait training Goals   , PT, DPT 11/18/21 1:47 PM

## 2021-11-18 ENCOUNTER — Other Ambulatory Visit: Payer: Self-pay

## 2021-11-18 ENCOUNTER — Ambulatory Visit: Payer: BC Managed Care – PPO | Attending: Family Medicine

## 2021-11-18 DIAGNOSIS — M6281 Muscle weakness (generalized): Secondary | ICD-10-CM | POA: Diagnosis present

## 2021-11-18 DIAGNOSIS — M5440 Lumbago with sciatica, unspecified side: Secondary | ICD-10-CM | POA: Insufficient documentation

## 2021-11-18 DIAGNOSIS — R262 Difficulty in walking, not elsewhere classified: Secondary | ICD-10-CM | POA: Insufficient documentation

## 2021-11-18 DIAGNOSIS — G8929 Other chronic pain: Secondary | ICD-10-CM | POA: Diagnosis present

## 2021-11-24 NOTE — Therapy (Signed)
OUTPATIENT PHYSICAL THERAPY TREATMENT NOTE   Patient Name: [name] MRN: [id number] DOB:1956/08/07, 66 y.o., male Today's Date: 11/25/2021  PCP: Rosaria Ferries, MD REFERRING PROVIDER: Macarthur Critchley, MD   PT End of Session - 11/25/21 1103     Visit Number 6    Number of Visits 16    Date for PT Re-Evaluation 12/16/21    Authorization Type BCBS                 Past Medical History:  Diagnosis Date   COPD (chronic obstructive pulmonary disease) ([place])    GERD (gastroesophageal reflux disease)    History of hepatitis C    treated several years ago through [facility]   Hypertension    Past Surgical History:  Procedure Laterality Date   CATARACT EXTRACTION, BILATERAL     EYE SURGERY Bilateral    IR THORACENTESIS ASP PLEURAL SPACE W/IMG GUIDE  09/14/2021   IR THORACENTESIS ASP PLEURAL SPACE W/IMG GUIDE  09/21/2021   ORIF PELVIC FRACTURE WITH PERCUTANEOUS SCREWS Right 07/29/2021   Procedure: SI SCREW FIXATION OF RIGHT PELVIC RING;  Surgeon: Altamese Sanborn, MD;  Location: [facility];  Service: Orthopedics;  Laterality: Right;   ORTHOPEDIC SURGERY Bilateral    pt states fracture repairs to arms and legs.   REVERSE SHOULDER ARTHROPLASTY Left 09/23/2019   Procedure: REVERSE SHOULDER ARTHROPLASTY;  Surgeon: Tania Ade, MD;  Location: [facility];  Service: Orthopedics;  Laterality: Left;   TRACHEOSTOMY TUBE PLACEMENT N/A 08/10/2021   Procedure: TRACHEOSTOMY;  Surgeon: Georganna Skeans, MD;  Location: [facility];  Service: General;  Laterality: N/A;   Patient Active Problem List   Diagnosis Date Noted   Acute on chronic respiratory failure with hypoxia ([place]) 09/08/2021   Traumatic fracture of ribs of right side with pneumothorax 09/08/2021   History of alcohol abuse 09/08/2021   Delirium due to another medical condition 09/08/2021   Healthcare associated bacterial pneumonia 09/08/2021   Anxiety disorder due to known physiological condition    Fall 07/27/2021   Screening  for prostate cancer 01/27/2021   Elevated LFTs 01/27/2021   Former smoker 01/27/2021   Chronic respiratory failure with hypoxia ([place]) 11/08/2020   Obstructive sleep apnea hypopnea, severe 09/24/2020   Exudative age-related macular degeneration of left eye with active choroidal neovascularization ([place]) 01/28/2020   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   Exudative age-related macular degeneration of right eye with active choroidal neovascularization ([place]) 01/28/2020   CAD (coronary artery disease) 10/16/2019   S/P reverse total shoulder arthroplasty, left 09/23/2019   Overweight (BMI 25.0-29.9) 08/13/2019   Hyperlipidemia 08/13/2019   Essential hypertension 08/13/2019   Gastroesophageal reflux disease 08/13/2019   Macular degeneration, wet ([place]) 11/09/2017   Lung nodules 10/28/2015   COPD mixed type ([place]) 10/01/2014   Chest x-ray abnormality 10/01/2014    REFERRING DIAG: pelvic fracture  THERAPY DIAG:  No diagnosis found.  PERTINENT HISTORY: ORIF Pelvic fx with percutaneous screws 07/29/21, reverse TSR Left,2021, tracheostomy 11/22 22, COPD, GERD , Hx of Hep C, HTN See medical history   SUBJECTIVE: Pt reports continued low level Lt hip pain today and Left low back  PAIN:  Are you having pain? Yes NPRS scale: 4/10 on left hip today.  Low back is 2/10 today Pain location: Rt low back, sacrum, BIL hips PAIN TYPE: aching and dull Pain description: constant  Aggravating factors: prolonged sitting Relieving factors: Walking, Tylenol     OBJECTIVE:   *Unless otherwise noted, objective data collected  previously* DIAGNOSTIC FINDINGS:  IMPRESSION: 1. Comminuted bilateral pubic rami. Superior offset of the left superior ramus is stable from a recent CT but increased since December 2nd. 2. Stable bilateral sacral cannulated screws. No new osseous abnormality identified.   PATIENT SURVEYS:  FOTO 28% predicted 56% 11-25-21  59% predicted 56%   SCREENING FOR RED  FLAGS: Bowel or bladder incontinence: No Spinal tumors: No Cauda equina syndrome: No no numbness noted Compression fracture: No Abdominal aneurysm: No   COGNITION:          Overall cognitive status: Within functional limits for tasks assessed                        SENSATION:          Light touch: Appears intact          Stereognosis: Appears intact          Hot/Cold: Appears intact          Proprioception: Appears intact   MUSCLE LENGTH: Hamstrings: Right 55 deg pain; Left 68 deg Thomas test: Right 25 deg; Left 20 deg   POSTURE:  Noted bil tremors noted bil UE     PALPATION: Increased pain over bil lumbar paraspinal  TTP over R SI   LUMBARAROM/PROM   A/PROM A/PROM  10/21/2021  Flexion Mid shin  Extension 25%  Right lateral flexion Finger tip to knee jt line  Left lateral flexion 4 inch finger tip above knee jt line  Right rotation 60%  Left rotation 60%   (Blank rows = not tested)   LE AROM/PROM:   A/PROM Right 10/21/2021 Left 10/21/2021  Hip flexion 121 118  Hip extension      Hip abduction      Hip adduction      Hip internal rotation      Hip external rotation      Knee flexion 130 135  Knee extension -6 -3  Ankle dorsiflexion      Ankle plantarflexion      Ankle inversion      Ankle eversion       (Blank rows = not tested)   LE MMT: NT  with resistance due to pelvic fracture, standing to assess gross strength MMT Right 10/21/2021 Left 10/21/2021 Right 11/18/2021 Left 11/18/2021  Hip flexion 3-/5 3-/5 5/5 5/5  Hip extension 3-/5 3-/5 3+/5 4/5  Hip abduction 3-/5 3-/5 4/5 3+/5  Hip adduction        Hip internal rotation        Hip external rotation        Knee flexion        Knee extension 3-/5 3-/5 5/5 4+/5  Ankle dorsiflexion        Ankle plantarflexion        Ankle inversion        Ankle eversion         (Blank rows = not tested)   LUMBAR SPECIAL TESTS:  Not tested due to pelvic fracture   FUNCTIONAL TESTS:  30 sec sit to stand 7 in 30 sec,  unable to do 5 x STS must use hands 5 x sts  18.78 sec no use of hands  pain in knees  11/04/2021: 6MWT: 671f, 1.69 ft/sec gait speed   GAIT: Distance walked: 150 Assistive device utilized: Walker - 2 wheeled Level of assistance: Modified independence Comments: Pt gait velocity 1.83 ft/sec decreased stride length. Pt walker adjusted for pt height  11-25-21  3.07 ft/sec      TODAY'S TREATMENT   [facility] Adult PT Treatment:                                                DATE: 11-25-21 Therapeutic Exercise: Goblet squat to chair with 25# Deadlift x 10 45 #  GAIT- Pt 3.07 ft/sec Up and down steps carrying 25 # x 2 Pt backing down steps to simulate ladder for work 4 steps Manual Therapy:  STW to left gluteals , QL and piriformis and Left quad  Trigger Point Dry Needling Treatment: Pre-treatment instruction: Patient instructed on dry needling rationale, procedures, and possible side effects including pain during treatment (achy,cramping feeling), bruising, drop of blood, lightheadedness, nausea, sweating. Patient Consent Given: Yes Education handout provided: Yes Muscles treated: Left QL Left piriformis , gluteals, and left quad  Needle size and number: .30x56m x 5 and .30x738mx 2 Electrical stimulation performed: No Parameters: N/A Treatment response/outcome: Twitch response elicited and Palpable decrease in muscle tension Post-treatment instructions: Patient instructed to expect possible mild to moderate muscle soreness later today and/or tomorrow. Patient instructed in methods to reduce muscle soreness and to continue prescribed HEP. If patient was dry needled over the lung field, patient was instructed on signs and symptoms of pneumothorax and, however unlikely, to see immediate medical attention should they occur. Patient was also educated on signs and symptoms of infection and to seek medical attention should they occur. Patient verbalized understanding of these instructions and  education.     [facility] PT Treatment:                                                DATE: 11/18/2021 Therapeutic Exercise: Cybex standing hip abduction with 17.5# cable 2x10 BIL Cybex standing hip extension with 17.5# cable 2x10 BIL Manual Therapy: N/A Neuromuscular re-ed: Tandem walking in // bars with lateral PT perturbations x4 laps Standing balance clocks on Airex pad 2x5 cycles BIL Therapeutic Activity: 6-inch step up with knee drive and slow retro step down 2x10 BIL Dead lifts with two 15# dumbbells 3x10 Sumo squat side steps in // bars 2x3 laps Modalities: N/A Self Care: N/A   [facility] Adult PT Treatment:                                                DATE: 11-11-21 Therapeutic Exercise: N/A Manual Therapy:  PT manual stretch of Left hip PT assist with bridging with sheet for added hip extension; PT assist with overpressure with trunk rotation.  Neuromuscular re-ed: Standing weight shifting on Airex pad 2x10 forward, backward, left, right Standing on Airex pad with Pt providing perturbations to challenge balance Ambulation in // bars, UE support only as needed 90 degree  hip flexion march steps in llbar to step over cones , initially stepping on cones improving with practice  8 times the ll bar length Therapeutic Activity: Forward step-up, retro step-down on 6 -inch box 2x10 BIL Sumo squat side steps in // bars 3x2 laps VC for deeper squats Functional dead lifts with two 10# kettlebells in // bars  3x8, between sets sitting piriformis stretch 3 x 20 sec STS  x 10 with 10 # and OH reach to PT  [facility] Adult PT Treatment:                                                DATE: 11/04/2021 Therapeutic Exercise: N/A Manual Therapy: N/A Neuromuscular re-ed: Standing weight shifting on Airex pad 2x10 forward, backward, left, right Ambulation in // bars, UE support only as needed while stepping over rolled towels 3x2 laps Therapeutic Activity: Forward step-up, retro step-down on  4-inch box 2x10 BIL Sumo squat side steps in // bars 3x2 laps 6MWT Functional dead lifts with two 10# kettlebells in // bars 3x8 Modalities: N/A Self Care: N/A       PATIENT EDUCATION:  Education details: Updated HEP, educated on sleep hygiene techniques Person educated: Patient Education method: Explanation, Demonstration, Tactile cues, Verbal cues, and Handouts Education comprehension: verbalized understanding, returned demonstration, verbal cues required, and needs further education     HOME EXERCISE PROGRAM: Access Code: [email or web address] URL: [email or web address] Date: 10/21/2021 Prepared by: Voncille Lo   Exercises Supine Single Knee to Chest Stretch - 2-3 x daily - 7 x weekly - 1 sets - 5 reps - 10 hold Supine Lower Trunk Rotation - 2-3 x daily - 7 x weekly - 1 sets - 5 reps - 20 hold Sit to Stand with Counter Support - 3 x daily - 7 x weekly - 1 sets - 10 reps  Added 10/26/2021: Supine Bridge - 1 x daily - 7 x weekly - 3 sets - 10 reps - 3-sec hold Active Straight Leg Raise with Quad Set - 1 x daily - 7 x weekly - 2 sets - 10 reps - 3-sec hold Abdominal Isometric Hold - FEET OFF TABLE* - 1 x daily - 7 x weekly - 3 sets - 20-sec hold  Added 11-25-21 Goblet Squat to chair - 1 x daily - 7 x weekly - 3 sets - 10 reps Modified Deadlift with Pelvic Contraction - 1 x daily - 7 x weekly - 2 sets - 8 reps    ASSESSMENT:   CLINICAL IMPRESSION: Pt consented to TPDN and  responded well with decreased pain.  Pt achieved # 6 and & LTG and partially met # 3 LTG. Pt increased gait velocity to 3.73f/sec today. FOTO improved to 59% . 5 x sts 18.78 today. Pt still with some balance deficits and will benefit from strengthening and balance training for the remainder of visits. Pt does fatigue due to COPD but was able to participate fully with all exercises and increasing weights.  He will continue to benefit from skilled PT to address his primary impairments and return to his  prior level of function with less limitation.          REHAB POTENTIAL: Good   CLINICAL DECISION MAKING: Evolving/moderate complexity   EVALUATION COMPLEXITY: Moderate     GOALS: Goals reviewed with patient? Yes partially   SHORT TERM GOALS:   STG Name Target Date Goal status  1 Pt will be independent with initial HEP Baseline: no knowledge 10/26/2021: Pt reports daily adherence to his HEP 11/18/2021 ACHIEVED  2 Pt will be able to perform Sit to stand with no use of UE Baseline: Pt requires heavy use of UE for rising from sit to stand 11/04/2021: Pt able  to stand with hands on knees 11-25-21 achieved  5 x STS without use of hands 18.78 sec 11/18/2021 ACHIEVED  3 Pt will be able to verbalize 3 points for sleep hygiene and implement Baseline:Pt wakes 4-5 times a night due to pain 10/26/2021: Discussed sleep hygiene techniques with pt 11-25-21 sleeping 7-8 hours 11/18/2021 ACHIEVED  4 Pt will use LRAD to walk for 6 MWT Baseline: unable to walk for longer than 5 minutes 11/04/2021: 6MWT completed: 610 ft 11/18/2021 ACHIEVED    LONG TERM GOALS:    LTG Name Target Date Goal status  1 Pt will be independent with advanced HEP  Baseline: no knowledge 12/16/2021 ONGOING  2 Pt will be able to perform 5 X sts in 13 seconds or less for decreased risk of fall and increased  LE MMT Baseline: 18.78sec 11-25-21 12/16/2021 ONGOING  3 Pt will be able to pick up 25 # of groceries from the ground wihtout exacerbating pain Baseline: unable to lift any amount due to pelvic fracture and pain 11-25-21 pt goblet squat 25 # to chair x 9. Picked up 25 # from 8 inch step 12/16/2021 PARTIALLY met  4 Pt will return to walking without  need AD for exercise for at least 1 mile  Baseline:Used to walk 5 miles a day but cannot walk for exercise 11-25-21 walking 1/2 mile 12/16/2021 ONGOING  5 Pt will be able to deadlift 75# in order to be able to carry materials for work related activities Baseline: unable to lift at this time 11-25-21  Pt able to deadlift 45 # 12/16/2021 ONGOING  6 Pt will be able to sleep through the night without waking due to turning for at least 4-5 hours  Baseline: wake every time he turns at night 11-25-21 pt sleeps 7-8 hours with medication 12/16/2021 ACHIEVED  7 Pt will be able to walk in community for 2.62 Ft/sec velocity Baseline: eval 1.25f/sec 11-25-21 3.07 ft/sec 12/16/2021 ACHIEVED    8 Pt will demonstrate ability climb ladder for return to work as well as rise from ground independently to show decrease in fall risk for work conditions Baseline: Pt learning to utilized walker to ambulate at eval   12/16/2021   ONGOING    PLAN: PT FREQUENCY: 2x/week   PT DURATION: 8 weeks   PLANNED INTERVENTIONS: Therapeutic exercises, Therapeutic activity, Neuro Muscular re-education, Balance training, Gait training, Patient/Family education, Joint mobilization, Stair training, DME instructions, Aquatic Therapy, Dry Needling, Electrical stimulation, Spinal mobilization, Cryotherapy, Moist heat, Taping, Ionotophoresis 478mml Dexamethasone, and Manual therapy   PLAN FOR NEXT SESSION: Review HEP and progress. Gait training  and lifting/balance  LaVoncille LoPT, ATBrowntownertified Exercise Expert for the Aging Adult  11/25/21 12:10 PM Phone: [phone]: [phone]

## 2021-11-25 ENCOUNTER — Ambulatory Visit: Payer: BC Managed Care – PPO | Admitting: Physical Therapy

## 2021-11-25 ENCOUNTER — Other Ambulatory Visit: Payer: Self-pay

## 2021-11-25 ENCOUNTER — Encounter: Payer: Self-pay | Admitting: Physical Therapy

## 2021-11-25 DIAGNOSIS — M6281 Muscle weakness (generalized): Secondary | ICD-10-CM

## 2021-11-25 DIAGNOSIS — R262 Difficulty in walking, not elsewhere classified: Secondary | ICD-10-CM

## 2021-11-25 DIAGNOSIS — G8929 Other chronic pain: Secondary | ICD-10-CM

## 2021-11-25 DIAGNOSIS — M5440 Lumbago with sciatica, unspecified side: Secondary | ICD-10-CM | POA: Diagnosis not present

## 2021-11-25 NOTE — Patient Instructions (Addendum)
?  Trigger Point Dry Needling ? ?What is Trigger Point Dry Needling (DN)? ?DN is a physical therapy technique used to treat muscle pain and dysfunction. Specifically, DN helps deactivate muscle trigger points (muscle knots).  ?A thin filiform needle is used to penetrate the skin and stimulate the underlying trigger point. The goal is for a local twitch response (LTR) to occur and for the trigger point to relax. No medication of any kind is injected during the procedure.  ? ?What Does Trigger Point Dry Needling Feel Like?  ?The procedure feels different for each individual patient. Some patients report that they do not actually feel the needle enter the skin and overall the process is not painful. Very mild bleeding may occur. However, many patients feel a deep cramping in the muscle in which the needle was inserted. This is the local twitch response.  ? ?How Will I feel after the treatment? ?Soreness is normal, and the onset of soreness may not occur for a few hours. Typically this soreness does not last longer than two days.  ?Bruising is uncommon, however; ice can be used to decrease any possible bruising.  ?In rare cases feeling tired or nauseous after the treatment is normal. In addition, your symptoms may get worse before they get better, this period will typically not last longer than 24 hours.  ? ?What Can I do After My Treatment? ?Increase your hydration by drinking more water for the next 24 hours. ?You may place ice or heat on the areas treated that have become sore, however, do not use heat on inflamed or bruised areas. Heat often brings more relief post needling. ?You can continue your regular activities, but vigorous activity is not recommended initially after the treatment for 24 hours. ?DN is best combined with other physical therapy such as strengthening, stretching, and other therapies.   ?Access Code: [email or web address] ?Added to HEP  ?Goblet Squat to chair - 1 x daily - 7 x weekly - 3 sets - 10  reps ?Modified Deadlift with Pelvic Contraction - 1 x daily - 7 x weekly - 2 sets - 8 reps ? ? ? ?Garen Lah, PT, ATRIC ?Certified Exercise Expert for the Aging Adult  ?11/25/21 11:14 AM ?Phone: [phone] ?Fax: [phone]  ?

## 2021-12-01 NOTE — Therapy (Signed)
?OUTPATIENT PHYSICAL THERAPY TREATMENT NOTE ? ? ?Patient Name: [name] ?MRN: [id number] ?DOB:02/23/1956, 66 y.o., male ?Today's Date: 12/02/2021 ? ?PCP: Shayne Alken, MD ?REFERRING PROVIDER: Luna Kitchens Delore* ? ? PT End of Session - 12/02/21 0845   ? ? Visit Number 7   ? Number of Visits 16   ? Date for PT Re-Evaluation 12/16/21   ? Authorization Type BCBS   ? PT Start Time 0845   ? PT Stop Time [phone]   ? PT Time Calculation (min) 57 min   ? Activity Tolerance Patient tolerated treatment well   ? Behavior During Therapy [facility] for tasks assessed/performed   ? ?  ?  ? ?  ? ? ? ? ? ? ? ?Past Medical History:  ?Diagnosis Date  ? COPD (chronic obstructive pulmonary disease) (HCC)   ? GERD (gastroesophageal reflux disease)   ? History of hepatitis C   ? treated several years ago through [facility]  ? Hypertension   ? ?Past Surgical History:  ?Procedure Laterality Date  ? CATARACT EXTRACTION, BILATERAL    ? EYE SURGERY Bilateral   ? IR THORACENTESIS ASP PLEURAL SPACE W/IMG GUIDE  09/14/2021  ? IR THORACENTESIS ASP PLEURAL SPACE W/IMG GUIDE  09/21/2021  ? ORIF PELVIC FRACTURE WITH PERCUTANEOUS SCREWS Right 07/29/2021  ? Procedure: SI SCREW FIXATION OF RIGHT PELVIC RING;  Surgeon: Myrene Galas, MD;  Location: [facility] OR;  Service: Orthopedics;  Laterality: Right;  ? ORTHOPEDIC SURGERY Bilateral   ? pt states fracture repairs to arms and legs.  ? REVERSE SHOULDER ARTHROPLASTY Left 09/23/2019  ? Procedure: REVERSE SHOULDER ARTHROPLASTY;  Surgeon: Jones Broom, MD;  Location: [facility] ORS;  Service: Orthopedics;  Laterality: Left;  ? TRACHEOSTOMY TUBE PLACEMENT N/A 08/10/2021  ? Procedure: TRACHEOSTOMY;  Surgeon: Violeta Gelinas, MD;  Location: [facility] OR;  Service: General;  Laterality: N/A;  ? ?Patient Active Problem List  ? Diagnosis Date Noted  ? Acute on chronic respiratory failure with hypoxia (HCC) 09/08/2021  ? Traumatic fracture of ribs of right side with pneumothorax 09/08/2021  ? History of alcohol abuse  09/08/2021  ? Delirium due to another medical condition 09/08/2021  ? Healthcare associated bacterial pneumonia 09/08/2021  ? Anxiety disorder due to known physiological condition   ? Fall 07/27/2021  ? Screening for prostate cancer 01/27/2021  ? Elevated LFTs 01/27/2021  ? Former smoker 01/27/2021  ? Chronic respiratory failure with hypoxia (HCC) 11/08/2020  ? Obstructive sleep apnea hypopnea, severe 09/24/2020  ? Exudative age-related macular degeneration of left eye with active choroidal neovascularization (HCC) 01/28/2020  ? 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  ? Exudative age-related macular degeneration of right eye with active choroidal neovascularization (HCC) 01/28/2020  ? CAD (coronary artery disease) 10/16/2019  ? S/P reverse total shoulder arthroplasty, left 09/23/2019  ? Overweight (BMI 25.0-29.9) 08/13/2019  ? Hyperlipidemia 08/13/2019  ? Essential hypertension 08/13/2019  ? Gastroesophageal reflux disease 08/13/2019  ? Macular degeneration, wet (HCC) 11/09/2017  ? Lung nodules 10/28/2015  ? COPD mixed type (HCC) 10/01/2014  ? Chest x-ray abnormality 10/01/2014  ? ? ?REFERRING DIAG: pelvic fracture ? ?THERAPY DIAG:  ?Chronic right-sided low back pain with sciatica, sciatica laterality unspecified ? ?Muscle weakness (generalized) ? ?Difficulty in walking, not elsewhere classified ? ?PERTINENT HISTORY: ORIF Pelvic fx with percutaneous screws 07/29/21, reverse TSR Left,2021, tracheostomy 11/22 22, COPD, GERD , Hx of Hep C, HTN See medical history  ? ?SUBJECTIVE: I have felt more stiff since I have been walking more.   ? ?  PAIN:  ?Are you having pain? Yes ?NPRS scale: 4/10 on left hip today.  Low back is 4/10 today ?Pain location: Rt low back, sacrum, BIL hips ?PAIN TYPE: aching and dull ?Pain description: constant  ?Aggravating factors: prolonged sitting ?Relieving factors: Walking, Tylenol ? ? ? ? ?OBJECTIVE:  ? *Unless otherwise noted, objective data collected previously* ?DIAGNOSTIC  FINDINGS:  ?IMPRESSION: ?1. Comminuted bilateral pubic rami. Superior offset of the left ?superior ramus is stable from a recent CT but increased since ?December 2nd. ?2. Stable bilateral sacral cannulated screws. No new osseous ?abnormality identified. ?  ?PATIENT SURVEYS:  ?FOTO 28% predicted 56% ?11-25-21  59% predicted 56% ?  ?SCREENING FOR RED FLAGS: ?Bowel or bladder incontinence: No ?Spinal tumors: No ?Cauda equina syndrome: No no numbness noted ?Compression fracture: No ?Abdominal aneurysm: No ?  ?COGNITION: ?         Overall cognitive status: Within functional limits for tasks assessed              ?          ?SENSATION: ?         Light touch: Appears intact ?         Stereognosis: Appears intact ?         Hot/Cold: Appears intact ?         Proprioception: Appears intact ?  ?MUSCLE LENGTH: ?Hamstrings: Right 55 deg pain; Left 68 deg ?Thomas test: Right 25 deg; Left 20 deg ?  ?POSTURE:  ?Noted bil tremors noted bil UE ?  ?  ?PALPATION: ?Increased pain over bil lumbar paraspinal  TTP over R SI ?  ?LUMBARAROM/PROM ?  ?A/PROM A/PROM  ?10/21/2021  ?Flexion Mid shin  ?Extension 25%  ?Right lateral flexion Finger tip to knee jt line  ?Left lateral flexion 4 inch finger tip above knee jt line  ?Right rotation 60%  ?Left rotation 60%  ? (Blank rows = not tested) ?  ?LE AROM/PROM: ?  ?A/PROM Right ?10/21/2021 Left ?10/21/2021  ?Hip flexion 121 118  ?Hip extension      ?Hip abduction      ?Hip adduction      ?Hip internal rotation      ?Hip external rotation      ?Knee flexion 130 135  ?Knee extension -6 -3  ?Ankle dorsiflexion      ?Ankle plantarflexion      ?Ankle inversion      ?Ankle eversion      ? (Blank rows = not tested) ?  ?LE MMT: ?NT  with resistance due to pelvic fracture, standing to assess gross strength ?MMT Right ?10/21/2021 Left ?10/21/2021 Right ?11/18/2021 Left ?11/18/2021 Left  ?12-02-21  ?Hip flexion 3-/5 3-/5 5/5 5/5 5/5  ?Hip extension 3-/5 3-/5 3+/5 4/5 4/5  ?Hip abduction 3-/5 3-/5 4/5 3+/5 4-/5  ?Hip  adduction         ?Hip internal rotation         ?Hip external rotation         ?Knee flexion         ?Knee extension 3-/5 3-/5 5/5 4+/5 4+/5  ?Ankle dorsiflexion         ?Ankle plantarflexion         ?Ankle inversion         ?Ankle eversion         ? (Blank rows = not tested) ?  ?LUMBAR SPECIAL TESTS:  ?Not tested due to pelvic fracture ?  ?FUNCTIONAL TESTS:  ?30 sec  sit to stand 7 in 30 sec, unable to do 5 x STS must use hands ?5 x sts  18.78 sec no use of hands  pain in knees ?12-02-21 5x STS 16.74 sec no use of hands  ? ?11/04/2021: : 667ft, 1.69 ft/sec gait speed ?12-02-21   923ft ?  ?GAIT: ?Distance walked: 150 ?Assistive device utilized: Environmental consultant - 2 wheeled ?Level of assistance: Modified independence ?Comments: Pt gait velocity 1.83 ft/sec decreased stride length. Pt walker adjusted for pt height ? 11-25-21 3.07 ft/sec    12-02-21 3.39 ft/sec ?  ?  ?TODAY'S TREATMENT  ?[facility] Adult PT Treatment:                                                DATE: 12-02-21 ?12-02-21   974ft ?12-02-21 5x STS 16.74 sec no use of hands  ?12-02-21  3.39 ft/sec ?Therapeutic Exercise: ?Chair squat 1 x 10 with BTB around knees, improves knee irritation ?Sit to stand with 20# 2 x 5 , ?SLR 3 x 10 on L with 6 # wt ?LAQ 2 x 10 on L with 6# weight ?Piriformis stretch on left ?Manual Therapy: ?Manual Therapy: ? STW to left gluteals , Left piriformis and Left gluteals ? ?Trigger Point Dry Needling Treatment: ?Pre-treatment instruction: Patient instructed on dry needling rationale, procedures, and possible side effects including pain during treatment (achy,cramping feeling), bruising, drop of blood, lightheadedness, nausea, sweating. ?Patient Consent Given: Yes ?Education handout provided: Yes ?Muscles treated:piriformis Left, gluteals ?Needle size and number: .30x83mm x 2 ?Electrical stimulation performed: No ?Parameters: N/A ?Treatment response/outcome: Twitch response elicited and Palpable decrease in muscle tension ?Post-treatment  instructions: Patient instructed to expect possible mild to moderate muscle soreness later today and/or tomorrow. Patient instructed in methods to reduce muscle soreness and to continue prescribed HEP. If patient was dry

## 2021-12-02 ENCOUNTER — Ambulatory Visit: Payer: BC Managed Care – PPO | Admitting: Physical Therapy

## 2021-12-02 ENCOUNTER — Other Ambulatory Visit: Payer: Self-pay

## 2021-12-02 ENCOUNTER — Encounter: Payer: Self-pay | Admitting: Physical Therapy

## 2021-12-02 DIAGNOSIS — M5440 Lumbago with sciatica, unspecified side: Secondary | ICD-10-CM | POA: Diagnosis not present

## 2021-12-08 NOTE — Therapy (Signed)
?OUTPATIENT PHYSICAL THERAPY TREATMENT NOTE ? ? ?Patient Name: [name] ?MRN: [id number] ?DOB:1956/07/10, 66 y.o., male ?Today's Date: 12/09/2021 ? ?PCP: Shayne Alken, MD ?REFERRING PROVIDER: [name], MD ? ? PT End of Session - 12/09/21 1209   ? ? Visit Number 8   ? Number of Visits 16   ? Date for PT Re-Evaluation 12/16/21   ? Authorization Type BCBS   ? PT Start Time 1212   ? PT Stop Time 1308   10 minutes vasopneumatic treatment  ? PT Time Calculation (min) 56 min   ? Equipment Utilized During Treatment Gait belt   ? Activity Tolerance Patient tolerated treatment well   ? Behavior During Therapy [facility] for tasks assessed/performed   ? ?  ?  ? ?  ? ? ? ? ? ? ? ? ?Past Medical History:  ?Diagnosis Date  ? COPD (chronic obstructive pulmonary disease) (HCC)   ? GERD (gastroesophageal reflux disease)   ? History of hepatitis C   ? treated several years ago through [facility]  ? Hypertension   ? ?Past Surgical History:  ?Procedure Laterality Date  ? CATARACT EXTRACTION, BILATERAL    ? EYE SURGERY Bilateral   ? IR THORACENTESIS ASP PLEURAL SPACE W/IMG GUIDE  09/14/2021  ? IR THORACENTESIS ASP PLEURAL SPACE W/IMG GUIDE  09/21/2021  ? ORIF PELVIC FRACTURE WITH PERCUTANEOUS SCREWS Right 07/29/2021  ? Procedure: SI SCREW FIXATION OF RIGHT PELVIC RING;  Surgeon: Myrene Galas, MD;  Location: [facility] OR;  Service: Orthopedics;  Laterality: Right;  ? ORTHOPEDIC SURGERY Bilateral   ? pt states fracture repairs to arms and legs.  ? REVERSE SHOULDER ARTHROPLASTY Left 09/23/2019  ? Procedure: REVERSE SHOULDER ARTHROPLASTY;  Surgeon: Jones Broom, MD;  Location: [facility] ORS;  Service: Orthopedics;  Laterality: Left;  ? TRACHEOSTOMY TUBE PLACEMENT N/A 08/10/2021  ? Procedure: TRACHEOSTOMY;  Surgeon: Violeta Gelinas, MD;  Location: [facility] OR;  Service: General;  Laterality: N/A;  ? ?Patient Active Problem List  ? Diagnosis Date Noted  ? Acute on chronic respiratory failure with hypoxia (HCC) 09/08/2021  ? Traumatic fracture of  ribs of right side with pneumothorax 09/08/2021  ? History of alcohol abuse 09/08/2021  ? Delirium due to another medical condition 09/08/2021  ? Healthcare associated bacterial pneumonia 09/08/2021  ? Anxiety disorder due to known physiological condition   ? Fall 07/27/2021  ? Screening for prostate cancer 01/27/2021  ? Elevated LFTs 01/27/2021  ? Former smoker 01/27/2021  ? Chronic respiratory failure with hypoxia (HCC) 11/08/2020  ? Obstructive sleep apnea hypopnea, severe 09/24/2020  ? Exudative age-related macular degeneration of left eye with active choroidal neovascularization (HCC) 01/28/2020  ? 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  ? Exudative age-related macular degeneration of right eye with active choroidal neovascularization (HCC) 01/28/2020  ? CAD (coronary artery disease) 10/16/2019  ? S/P reverse total shoulder arthroplasty, left 09/23/2019  ? Overweight (BMI 25.0-29.9) 08/13/2019  ? Hyperlipidemia 08/13/2019  ? Essential hypertension 08/13/2019  ? Gastroesophageal reflux disease 08/13/2019  ? Macular degeneration, wet (HCC) 11/09/2017  ? Lung nodules 10/28/2015  ? COPD mixed type (HCC) 10/01/2014  ? Chest x-ray abnormality 10/01/2014  ? ? ?REFERRING DIAG: pelvic fracture ? ?THERAPY DIAG:  ?Chronic right-sided low back pain with sciatica, sciatica laterality unspecified ? ?Muscle weakness (generalized) ? ?Difficulty in walking, not elsewhere classified ? ?PERTINENT HISTORY: ORIF Pelvic fx with percutaneous screws 07/29/21, reverse TSR Left,2021, tracheostomy 11/22 22, COPD, GERD , Hx of Hep C, HTN See medical  history  ? ?SUBJECTIVE: Pt reports increasing his steps from 900 to 1,900 last Friday, which he reports led to increased Lt knee pain. He reports that due to his leg "giving out" when stepping out of bed the following morning, he fell forward onto his face. He denies any injuries resulting from this fall and has been able to walk with continued Lt knee pain since that time. He  arrives with a knee brace today, which he reports helps his pain. He rates his current knee pain as 1-2/10, but reports it is 8/10 when walking without the brace. ? ?PAIN:  ?Are you having pain? Yes ?NPRS scale: 2/10 on left hip today.  Low back is 1/10 today, Lt knee 2/10 ?Pain location: Rt low back, sacrum, BIL hips ?PAIN TYPE: aching and dull ?Pain description: constant  ?Aggravating factors: prolonged sitting ?Relieving factors: Walking, Tylenol ? ? ? ? ?OBJECTIVE:  ? *Unless otherwise noted, objective data collected previously* ?DIAGNOSTIC FINDINGS:  ?IMPRESSION: ?1. Comminuted bilateral pubic rami. Superior offset of the left ?superior ramus is stable from a recent CT but increased since ?December 2nd. ?2. Stable bilateral sacral cannulated screws. No new osseous ?abnormality identified. ?  ?PATIENT SURVEYS:  ?FOTO 28% predicted 56% ?11-25-21  59% predicted 56% ?  ?SCREENING FOR RED FLAGS: ?Bowel or bladder incontinence: No ?Spinal tumors: No ?Cauda equina syndrome: No no numbness noted ?Compression fracture: No ?Abdominal aneurysm: No ?  ?COGNITION: ?         Overall cognitive status: Within functional limits for tasks assessed              ?          ?SENSATION: ?         Light touch: Appears intact ?         Stereognosis: Appears intact ?         Hot/Cold: Appears intact ?         Proprioception: Appears intact ?  ?MUSCLE LENGTH: ?Hamstrings: Right 55 deg pain; Left 68 deg ?Thomas test: Right 25 deg; Left 20 deg ?  ?POSTURE:  ?Noted bil tremors noted bil UE ?  ?  ?PALPATION: ?Increased pain over bil lumbar paraspinal  TTP over R SI ?  ?LUMBARAROM/PROM ?  ?A/PROM A/PROM  ?10/21/2021  ?Flexion Mid shin  ?Extension 25%  ?Right lateral flexion Finger tip to knee jt line  ?Left lateral flexion 4 inch finger tip above knee jt line  ?Right rotation 60%  ?Left rotation 60%  ? (Blank rows = not tested) ?  ?LE AROM/PROM: ?  ?A/PROM Right ?10/21/2021 Left ?10/21/2021  ?Hip flexion 121 118  ?Hip extension      ?Hip abduction       ?Hip adduction      ?Hip internal rotation      ?Hip external rotation      ?Knee flexion 130 135  ?Knee extension -6 -3  ?Ankle dorsiflexion      ?Ankle plantarflexion      ?Ankle inversion      ?Ankle eversion      ? (Blank rows = not tested) ?  ?LE MMT: ?NT  with resistance due to pelvic fracture, standing to assess gross strength ?MMT Right ?10/21/2021 Left ?10/21/2021 Right ?11/18/2021 Left ?11/18/2021 Left  ?12-02-21  ?Hip flexion 3-/5 3-/5 5/5 5/5 5/5  ?Hip extension 3-/5 3-/5 3+/5 4/5 4/5  ?Hip abduction 3-/5 3-/5 4/5 3+/5 4-/5  ?Hip adduction         ?Hip  internal rotation         ?Hip external rotation         ?Knee flexion         ?Knee extension 3-/5 3-/5 5/5 4+/5 4+/5  ?Ankle dorsiflexion         ?Ankle plantarflexion         ?Ankle inversion         ?Ankle eversion         ? (Blank rows = not tested) ?  ?LUMBAR SPECIAL TESTS:  ?Not tested due to pelvic fracture ?  ?FUNCTIONAL TESTS:  ?30 sec sit to stand 7 in 30 sec, unable to do 5 x STS must use hands ?5 x sts  18.78 sec no use of hands  pain in knees ?12-02-21 5x STS 16.74 sec no use of hands  ? ?11/04/2021: : 665ft, 1.69 ft/sec gait speed ?12-02-21   942ft ?  ?GAIT: ?Distance walked: 150 ?Assistive device utilized: Environmental consultant - 2 wheeled ?Level of assistance: Modified independence ?Comments: Pt gait velocity 1.83 ft/sec decreased stride length. Pt walker adjusted for pt height ? 11-25-21 3.07 ft/sec    12-02-21 3.39 ft/sec ?  ?  ?TODAY'S TREATMENT  ? ?[facility] Adult PT Treatment:                                                DATE: 12/09/2021 ?Therapeutic Exercise: ?Side knee plank 3x30sec BIL ?Supine bicycles 3x16 ?Knee planks 3x1 minute ?Standing Cybex hip flexion with 25# 2x10 BIL ?Standing Cybex hip extension with 25# 2x10 BIL ?Standing Cybex hip abduction with 25# 2x10 BIL ?Manual Therapy: ?N/A ?Neuromuscular re-ed: ?N/A ?Therapeutic Activity: ?15# kettlebell dead lift in front of table 3x8 ? ?Modalities: ?10 minutes GameReady vasopneumatic treatment to Lt  knee with knee elevated in supine, 34 degrees F ?Self Care: ?N/A ? ? ?[facility] Adult PT Treatment:                                                DATE: 12-02-21 ?12-02-21   974ft ?12-02-21 5x STS 16.7

## 2021-12-09 ENCOUNTER — Ambulatory Visit: Payer: BC Managed Care – PPO

## 2021-12-09 ENCOUNTER — Other Ambulatory Visit: Payer: Self-pay

## 2021-12-09 DIAGNOSIS — G8929 Other chronic pain: Secondary | ICD-10-CM

## 2021-12-09 DIAGNOSIS — R262 Difficulty in walking, not elsewhere classified: Secondary | ICD-10-CM

## 2021-12-09 DIAGNOSIS — M6281 Muscle weakness (generalized): Secondary | ICD-10-CM

## 2021-12-09 DIAGNOSIS — M5440 Lumbago with sciatica, unspecified side: Secondary | ICD-10-CM | POA: Diagnosis not present

## 2021-12-15 NOTE — Therapy (Addendum)
?OUTPATIENT PHYSICAL THERAPY TREATMENT NOTE/ERO/PROGRESS NOTE ?Progress Note ?Reporting Period 10-21-21 to 12-16-2021 ? ?See note below for Objective Data and Assessment of Progress/Goals.  ? ?  ? ?Patient Name: [name] ?MRN: [id number] ?DOB:05-22-1956, 66 y.o., male ?Today's Date: 12/16/2021 ? ?PCP: Shayne Alken, MD ?REFERRING PROVIDER: [name], MD ? ? PT End of Session - 12/16/21 1334   ? ? Visit Number 9   ? Number of Visits 16   ? Date for PT Re-Evaluation 12/16/21   ? Authorization Type BCBS   ? PT Start Time 1330   ? PT Stop Time 1416   ? PT Time Calculation (min) 46 min   ? Activity Tolerance Patient tolerated treatment well;Patient limited by pain   ? Behavior During Therapy [facility] for tasks assessed/performed   ? ?  ?  ? ?  ? ? ? ? ? ? ? ? ? ?Past Medical History:  ?Diagnosis Date  ? COPD (chronic obstructive pulmonary disease) (HCC)   ? GERD (gastroesophageal reflux disease)   ? History of hepatitis C   ? treated several years ago through [facility]  ? Hypertension   ? ?Past Surgical History:  ?Procedure Laterality Date  ? CATARACT EXTRACTION, BILATERAL    ? EYE SURGERY Bilateral   ? IR THORACENTESIS ASP PLEURAL SPACE W/IMG GUIDE  09/14/2021  ? IR THORACENTESIS ASP PLEURAL SPACE W/IMG GUIDE  09/21/2021  ? ORIF PELVIC FRACTURE WITH PERCUTANEOUS SCREWS Right 07/29/2021  ? Procedure: SI SCREW FIXATION OF RIGHT PELVIC RING;  Surgeon: Myrene Galas, MD;  Location: [facility] OR;  Service: Orthopedics;  Laterality: Right;  ? ORTHOPEDIC SURGERY Bilateral   ? pt states fracture repairs to arms and legs.  ? REVERSE SHOULDER ARTHROPLASTY Left 09/23/2019  ? Procedure: REVERSE SHOULDER ARTHROPLASTY;  Surgeon: Jones Broom, MD;  Location: [facility] ORS;  Service: Orthopedics;  Laterality: Left;  ? TRACHEOSTOMY TUBE PLACEMENT N/A 08/10/2021  ? Procedure: TRACHEOSTOMY;  Surgeon: Violeta Gelinas, MD;  Location: [facility] OR;  Service: General;  Laterality: N/A;  ? ?Patient Active Problem List  ? Diagnosis Date Noted  ? Acute  on chronic respiratory failure with hypoxia (HCC) 09/08/2021  ? Traumatic fracture of ribs of right side with pneumothorax 09/08/2021  ? History of alcohol abuse 09/08/2021  ? Delirium due to another medical condition 09/08/2021  ? Healthcare associated bacterial pneumonia 09/08/2021  ? Anxiety disorder due to known physiological condition   ? Fall 07/27/2021  ? Screening for prostate cancer 01/27/2021  ? Elevated LFTs 01/27/2021  ? Former smoker 01/27/2021  ? Chronic respiratory failure with hypoxia (HCC) 11/08/2020  ? Obstructive sleep apnea hypopnea, severe 09/24/2020  ? Exudative age-related macular degeneration of left eye with active choroidal neovascularization (HCC) 01/28/2020  ? 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  ? Exudative age-related macular degeneration of right eye with active choroidal neovascularization (HCC) 01/28/2020  ? CAD (coronary artery disease) 10/16/2019  ? S/P reverse total shoulder arthroplasty, left 09/23/2019  ? Overweight (BMI 25.0-29.9) 08/13/2019  ? Hyperlipidemia 08/13/2019  ? Essential hypertension 08/13/2019  ? Gastroesophageal reflux disease 08/13/2019  ? Macular degeneration, wet (HCC) 11/09/2017  ? Lung nodules 10/28/2015  ? COPD mixed type (HCC) 10/01/2014  ? Chest x-ray abnormality 10/01/2014  ? ? ?REFERRING DIAG: pelvic fracture ? ?THERAPY DIAG:  ?Chronic right-sided low back pain with sciatica, sciatica laterality unspecified ? ?Muscle weakness (generalized) ? ?Difficulty in walking, not elsewhere classified ? ?PERTINENT HISTORY: ORIF Pelvic fx with percutaneous screws 07/29/21, reverse TSR Left,2021, tracheostomy  11/22 22, COPD, GERD , Hx of Hep C, HTN See medical history  ? ?SUBJECTIVE: I am hurting in my left hip 5/10, and left knee 5/10  I have been trying to walk more and I did not take my medication today. I am wearing my left knee brace that helps me.  I am trying to be more active but I still get tired after about 1 and 1/2 mile  I have not  walked more than 2 miles ? ? ?PAIN:  ?Are you having pain? Yes ?NPRS scale: 5/10 on left hip today.  Low back is 2/10 today, Lt knee 5/10 ?Pain location: Rt low back, sacrum, BIL hips knees L.R ?PAIN TYPE: aching and dull ?Pain description: constant  ?Aggravating factors: prolonged sitting ?Relieving factors: Walking, Tylenol ? ? ? ? ?OBJECTIVE:  ? *Unless otherwise noted, objective data collected previously* ?DIAGNOSTIC FINDINGS:  ?IMPRESSION: ?1. Comminuted bilateral pubic rami. Superior offset of the left ?superior ramus is stable from a recent CT but increased since ?December 2nd. ?2. Stable bilateral sacral cannulated screws. No new osseous ?abnormality identified. ?  ?PATIENT SURVEYS:  ?FOTO 28% predicted 56% ?11-25-21  59% predicted 56% ?  ?SCREENING FOR RED FLAGS: ?Bowel or bladder incontinence: No ?Spinal tumors: No ?Cauda equina syndrome: No no numbness noted ?Compression fracture: No ?Abdominal aneurysm: No ?  ?COGNITION: ?         Overall cognitive status: Within functional limits for tasks assessed              ?          ?SENSATION: ?         Light touch: Appears intact ?         Stereognosis: Appears intact ?         Hot/Cold: Appears intact ?         Proprioception: Appears intact ?  ?MUSCLE LENGTH: ?Hamstrings: Right 55 deg pain; Left 68 deg ?Thomas test: Right 25 deg; Left 20 deg retested 12-16-21  R 20, Left 20 ?  ?POSTURE:  ?Noted bil tremors noted bil UE ?  ?  ?PALPATION: ?Increased pain over bil lumbar paraspinal  TTP over R SI ?  ?LUMBARAROM/PROM ?  ?A/PROM A/PROM  ?10/21/2021 A/PROM ?12-16-21  ?Flexion Mid shin Able to touch toes bil  ?Extension 25% 40%  ?Right lateral flexion Finger tip to knee jt line Finger tip to knee jt lin  ?Left lateral flexion 4 inch finger tip above knee jt line 2inch finger tip above knee jt line  ?Right rotation 60% 75%  ?Left rotation 60% 75%  ? (Blank rows = not tested) ?  ?LE AROM/PROM: ?  ?A/PROM Right ?10/21/2021 Left ?10/21/2021 RIGHT ?12-16-21 LEFT ?12-16-21  ?Hip  flexion 121 118 120 123  ?Hip extension        ?Hip abduction        ?Hip adduction        ?Hip internal rotation        ?Hip external rotation        ?Knee flexion 130 135 132 135  ?Knee extension -6 -3 -6 -3  ?Ankle dorsiflexion        ?Ankle plantarflexion        ?Ankle inversion        ?Ankle eversion        ? (Blank rows = not tested) ?  ?LE MMT: ?NT  with resistance due to pelvic fracture, standing to assess gross strength ?MMT Right ?10/21/2021 Left ?10/21/2021  Right ?11/18/2021 Left ?11/18/2021 Left  ?12-02-21 Right ?12-16-21 Left  ?12-16-21  ?Hip flexion 3-/5 3-/5 5/5 5/5 5/5 4- 5  ?Hip extension 3-/5 3-/5 3+/5 4/5 4/5 4- 4-  ?Hip abduction 3-/5 3-/5 4/5 3+/5 4-/5 4- 4-  ?Hip adduction           ?Hip internal rotation           ?Hip external rotation           ?Knee flexion           ?Knee extension 3-/5 3-/5 5/5 4+/5 4+/5 4 4+  ?Ankle dorsiflexion           ?Ankle plantarflexion        4 4  ?Ankle inversion           ?Ankle eversion           ? (Blank rows = not tested) ?  ?LUMBAR SPECIAL TESTS:  ?Not tested due to pelvic fracture ?  ?FUNCTIONAL TESTS:  ?30 sec sit to stand 7 in 30 sec, unable to do 5 x STS must use hands ?5 x sts  18.78 sec no use of hands  pain in knees ?12-02-21 5x STS 16.74 sec no use of hands  ?12-16-21 5 x STS 13.72 sec  no use of hands ? ? ? ?11/04/2021: 6MWT: 6210ft, 1.69 ft/sec gait speed ?12-02-21 6MWT   92546ft ?12-16-21 6MWT  1201 ft  ? ?TUG - 11.50 sec, dual task 12.95 holding glass of water ?Floor to mat x fer ?  ?GAIT: ?Distance walked: 150 ?Assistive device utilized: Environmental consultantWalker - 2 wheeled ?Level of assistance: Modified independence ?Comments: Pt gait velocity 1.83 ft/sec decreased stride length. Pt walker adjusted for pt height ? 11-25-21 3.07 ft/sec    12-02-21 3.39 ft/sec  12-16-21  3.6939ft/sec ?  ?  ?TODAY'S TREATMENT  ?[facility]                                                DATE: 12-16-21 ?12-16-21 5 x STS 13.72 sec  no use of hands ?12-16-21 6MWT  1201 ft (normal for age [id number]) ?TUG  - 11.50 sec, dual task 12.95 holding glass of water ?DGI 17/24 ?And bold in  objective ?RE Evaluation today [facility]                                                DATE: 12/09/2021 ?Therapeutic

## 2021-12-16 ENCOUNTER — Encounter (INDEPENDENT_AMBULATORY_CARE_PROVIDER_SITE_OTHER): Payer: BC Managed Care – PPO | Admitting: Ophthalmology

## 2021-12-16 ENCOUNTER — Ambulatory Visit: Payer: BC Managed Care – PPO | Admitting: Physical Therapy

## 2021-12-16 ENCOUNTER — Encounter: Payer: Self-pay | Admitting: Physical Therapy

## 2021-12-16 DIAGNOSIS — R262 Difficulty in walking, not elsewhere classified: Secondary | ICD-10-CM

## 2021-12-16 DIAGNOSIS — M6281 Muscle weakness (generalized): Secondary | ICD-10-CM

## 2021-12-16 DIAGNOSIS — M5440 Lumbago with sciatica, unspecified side: Secondary | ICD-10-CM | POA: Diagnosis not present

## 2021-12-16 DIAGNOSIS — G8929 Other chronic pain: Secondary | ICD-10-CM

## 2021-12-20 ENCOUNTER — Encounter (INDEPENDENT_AMBULATORY_CARE_PROVIDER_SITE_OTHER): Payer: Self-pay | Admitting: Ophthalmology

## 2021-12-20 ENCOUNTER — Ambulatory Visit (INDEPENDENT_AMBULATORY_CARE_PROVIDER_SITE_OTHER): Payer: BC Managed Care – PPO | Admitting: Ophthalmology

## 2021-12-20 DIAGNOSIS — F1011 Alcohol abuse, in remission: Secondary | ICD-10-CM

## 2021-12-20 DIAGNOSIS — G4733 Obstructive sleep apnea (adult) (pediatric): Secondary | ICD-10-CM | POA: Diagnosis not present

## 2021-12-20 DIAGNOSIS — H353231 Exudative age-related macular degeneration, bilateral, with active choroidal neovascularization: Secondary | ICD-10-CM | POA: Diagnosis not present

## 2021-12-20 DIAGNOSIS — H353211 Exudative age-related macular degeneration, right eye, with active choroidal neovascularization: Secondary | ICD-10-CM

## 2021-12-20 DIAGNOSIS — H353221 Exudative age-related macular degeneration, left eye, with active choroidal neovascularization: Secondary | ICD-10-CM
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
## 2021-12-20 NOTE — Assessment & Plan Note (Addendum)
Patient with lengthy hospitalization during which she was either intubated trans tracheal for 4 weeks or in use with tracheostomy thus bypassing the upper airway obstruction but nonetheless in recent weeks off of CPAP, and I will recommend if tolerable to resume CPAP so as to maximize ongoing healing as well as protection of the eye and the brain from low oxygen at night  ?
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
## 2021-12-20 NOTE — Assessment & Plan Note (Addendum)
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

## 2021-12-21 ENCOUNTER — Encounter (INDEPENDENT_AMBULATORY_CARE_PROVIDER_SITE_OTHER): Payer: Self-pay | Admitting: Ophthalmology

## 2021-12-21 ENCOUNTER — Other Ambulatory Visit: Payer: Self-pay

## 2021-12-21 ENCOUNTER — Ambulatory Visit (INDEPENDENT_AMBULATORY_CARE_PROVIDER_SITE_OTHER): Payer: BC Managed Care – PPO | Admitting: Ophthalmology

## 2021-12-21 DIAGNOSIS — H353211 Exudative age-related macular degeneration, right eye, with active choroidal neovascularization: Secondary | ICD-10-CM

## 2021-12-21 DIAGNOSIS — H353221 Exudative age-related macular degeneration, left eye, with active choroidal neovascularization: Secondary | ICD-10-CM | POA: Diagnosis not present

## 2021-12-21 MED ORDER — AFLIBERCEPT 2MG/0.05ML IZ SOLN FOR KALEIDOSCOPE
2.0000 mg | INTRAVITREAL | Status: AC | PRN
  Administered 2021-12-21: 2 mg via INTRAVITREAL

## 2021-12-21 NOTE — Assessment & Plan Note (Signed)
Follow-up at scheduled within 1 week for injection #1 antivegF resumption ?

## 2021-12-21 NOTE — Progress Notes (Signed)
? ? ?12/21/2021 ? ?  ? ?CHIEF COMPLAINT ?Patient presents for  ?Chief Complaint  ?Patient presents with  ? Macular Degeneration  ? ? ? ? ?HISTORY OF PRESENT ILLNESS: ?[name] is a 66 y.o. male who presents to the clinic today for:  ? ?HPI   ?EYLEA OD OCT NO DILATION. ?No changes in vision. ?Pt denies floaters and FOL. ? ?Last edited by Silvestre Moment on 12/21/2021  8:16 AM.  ?  ? ? ?Referring physician: ?Rosaria Ferries, MD ?[address] ?[place],  [place] [address] ? ?HISTORICAL INFORMATION:  ? ?Selected notes from the [facility] ?  ? ?Lab Results  ?Component Value Date  ? HGBA1C 5.4 07/28/2021  ?  ? ?CURRENT MEDICATIONS: ?No current outpatient medications on file. (Ophthalmic Drugs)  ? ?No current facility-administered medications for this visit. (Ophthalmic Drugs)  ? ?Current Outpatient Medications (Other)  ?Medication Sig  ? acetaminophen (TYLENOL) 500 MG tablet Place 2 tablets (1,000 mg total) into feeding tube every 6 (six) hours.  ? aspirin 81 MG chewable tablet Place 1 tablet (81 mg total) into feeding tube daily.  ? bethanechol (URECHOLINE) 25 MG tablet Place 1 tablet (25 mg total) into feeding tube 3 (three) times daily.  ? Chlorhexidine Gluconate Cloth 2 % PADS Apply 6 each topically daily.  ? cholecalciferol (VITAMIN D) 25 MCG tablet Place 2 tablets (2,000 Units total) into feeding tube daily.  ? enoxaparin (LOVENOX) 40 MG/0.4ML injection Inject 0.4 mLs (40 mg total) into the skin every 12 (twelve) hours.  ? folic acid (FOLVITE) 1 MG tablet Place 1 tablet (1 mg total) into feeding tube daily.  ? gabapentin (NEURONTIN) 250 MG/5ML solution Place 6 mLs (300 mg total) into feeding tube every 8 (eight) hours.  ? glycopyrrolate (ROBINUL) 1 MG tablet Place 2 tablets (2 mg total) into feeding tube 3 (three) times daily.  ? guaiFENesin (ROBITUSSIN) 100 MG/5ML liquid Place 10 mLs into feeding tube every 4 (four) hours.  ? haloperidol lactate (HALDOL) 5 MG/ML injection Inject 1 mL (5 mg total) into the  vein every 6 (six) hours as needed.  ? hydrocortisone cream 1 % Apply topically 3 (three) times daily.  ? hydrOXYzine (ATARAX) 25 MG tablet Place 1 tablet (25 mg total) into feeding tube 4 (four) times daily as needed for anxiety (Give ~1 hr before vent trial; 1st line for anxiety prior to klonopin.).  ? hydrOXYzine (ATARAX) 25 MG tablet Place 1 tablet (25 mg total) into feeding tube 4 (four) times daily.  ? ibuprofen (ADVIL) 600 MG tablet Place 1 tablet (600 mg total) into feeding tube every 6 (six) hours as needed for moderate pain.  ? insulin aspart (NOVOLOG) 100 UNIT/ML injection Inject 0-15 Units into the skin every 4 (four) hours. (Patient not taking: Reported on 10/21/2021)  ? insulin glargine-yfgn (SEMGLEE) 100 UNIT/ML injection Inject 0.05 mLs (5 Units total) into the skin daily. (Patient not taking: Reported on 10/21/2021)  ? ipratropium-albuterol (DUONEB) 0.5-2.5 (3) MG/3ML SOLN Take 3 mLs by nebulization every 6 (six) hours as needed.  ? lidocaine (LIDODERM) 5 % Place 1 patch onto the skin daily. Remove & Discard patch within 12 hours or as directed by MD  ? melatonin 3 MG TABS tablet Place 1 tablet (3 mg total) into feeding tube daily at 6 PM. (Patient not taking: Reported on 10/21/2021)  ? methocarbamol 1000 MG TABS Place 1,000 mg into feeding tube every 8 (eight) hours. (Patient not taking: Reported on 10/21/2021)  ?  metoprolol tartrate (LOPRESSOR) 25 mg/10 mL SUSP Place 10 mLs (25 mg total) into feeding tube 2 (two) times daily. (Patient not taking: Reported on 10/21/2021)  ? Multiple Vitamin (MULTIVITAMIN WITH MINERALS) TABS tablet Place 1 tablet into feeding tube daily.  ? Nutritional Supplements (FEEDING SUPPLEMENT, JEVITY 1.5 CAL/FIBER,) LIQD Place 1,000 mLs into feeding tube continuous. (Patient not taking: Reported on 10/21/2021)  ? Nutritional Supplements (FEEDING SUPPLEMENT, PROSOURCE TF,) liquid Place 90 mLs into feeding tube 2 (two) times daily. (Patient not taking: Reported on 10/21/2021)  ? ondansetron  (ZOFRAN-ODT) 4 MG disintegrating tablet Take 1 tablet (4 mg total) by mouth every 6 (six) hours as needed for nausea.  ? oxyCODONE (ROXICODONE) 5 MG/5ML solution Place 5-10 mLs (5-10 mg total) into feeding tube every 3 (three) hours as needed for moderate pain or severe pain (5mg  for moderate pain, 10mg  for severe pain). (Patient not taking: Reported on 10/21/2021)  ? pantoprazole sodium (PROTONIX) 40 mg Place 40 mg into feeding tube daily.  ? polyethylene glycol (MIRALAX / GLYCOLAX) 17 g packet Place 17 g into feeding tube daily as needed for mild constipation. (Patient not taking: Reported on 10/21/2021)  ? pravastatin (PRAVACHOL) 40 MG tablet Place 1 tablet (40 mg total) into feeding tube daily.  ? QUEtiapine (SEROQUEL) 100 MG tablet Place 1 tablet (100 mg total) into feeding tube every morning. (Patient not taking: Reported on 10/21/2021)  ? QUEtiapine (SEROQUEL) 200 MG tablet Place 1 tablet (200 mg total) into feeding tube at bedtime. (Patient not taking: Reported on 10/21/2021)  ? silodosin (RAPAFLO) 8 MG CAPS capsule Place 1 capsule (8 mg total) into feeding tube daily with breakfast. (Patient not taking: Reported on 10/21/2021)  ? sodium chloride 0.9 % infusion Inject 250 mLs into the vein continuous. (Patient not taking: Reported on 10/21/2021)  ? thiamine (B-1) 100 MG/ML injection Inject 1 mL (100 mg total) into the vein daily. (Patient not taking: Reported on 10/21/2021)  ? thiamine 100 MG tablet Place 1 tablet (100 mg total) into feeding tube daily. (Patient not taking: Reported on 10/21/2021)  ? traZODone (DESYREL) 50 MG tablet Place 0.5 tablets (25 mg total) into feeding tube at bedtime.  ? Water For Irrigation, Sterile (FREE WATER) SOLN Place 250 mLs into feeding tube every 6 (six) hours.  ? ?No current facility-administered medications for this visit. (Other)  ? ? ? ? ?REVIEW OF SYSTEMS: ?ROS   ?Negative for: Constitutional, Gastrointestinal, Neurological, Skin, Genitourinary, Musculoskeletal, HENT, Endocrine,  Cardiovascular, Eyes, Respiratory, Psychiatric, Allergic/Imm, Heme/Lymph ?Last edited by Silvestre Moment on 12/21/2021  8:16 AM.  ?  ? ? ? ?ALLERGIES ?No Known Allergies ? ?PAST MEDICAL HISTORY ?Past Medical History:  ?Diagnosis Date  ? COPD (chronic obstructive pulmonary disease) ([facility])   ? GERD (gastroesophageal reflux disease)   ? History of hepatitis C   ? treated several years ago through [facility]  ? Hypertension   ? ?Past Surgical History:  ?Procedure Laterality Date  ? CATARACT EXTRACTION, BILATERAL    ? EYE SURGERY Bilateral   ? IR THORACENTESIS ASP PLEURAL SPACE W/IMG GUIDE  09/14/2021  ? IR THORACENTESIS ASP PLEURAL SPACE W/IMG GUIDE  09/21/2021  ? ORIF PELVIC FRACTURE WITH PERCUTANEOUS SCREWS Right 07/29/2021  ? Procedure: SI SCREW FIXATION OF RIGHT PELVIC RING;  Surgeon: Altamese Jasper, MD;  Location: [facility];  Service: Orthopedics;  Laterality: Right;  ? ORTHOPEDIC SURGERY Bilateral   ? pt states fracture repairs to arms and legs.  ? REVERSE SHOULDER ARTHROPLASTY Left 09/23/2019  ? Procedure:  REVERSE SHOULDER ARTHROPLASTY;  Surgeon: Tania Ade, MD;  Location: [facility];  Service: Orthopedics;  Laterality: Left;  ? TRACHEOSTOMY TUBE PLACEMENT N/A 08/10/2021  ? Procedure: TRACHEOSTOMY;  Surgeon: Georganna Skeans, MD;  Location: [facility];  Service: General;  Laterality: N/A;  ? ? ?FAMILY HISTORY ?Family History  ?Problem Relation Age of Onset  ? Heart disease Mother   ? Hypertension Father   ? ? ?SOCIAL HISTORY ?Social History  ? ?Tobacco Use  ? Smoking status: Former  ?  Packs/day: 1.00  ?  Years: 35.00  ?  Pack years: 35.00  ?  Types: Cigarettes  ?  Start date: 78  ?  Quit date: 10/01/2010  ?  Years since quitting: 11.2  ? Smokeless tobacco: Never  ?Vaping Use  ? Vaping Use: Never used  ?Substance Use Topics  ? Alcohol use: Yes  ?  Alcohol/week: 42.0 standard drinks  ?  Types: 42 Cans of beer per week  ?  Comment: daily - 6 drinks a day  ? Drug use: Yes  ?  Types: Marijuana  ?  Comment: last marijuana 2-3 weeks ago  ? ?  ? ?   ? ?OPHTHALMIC EXAM: ? ?Base Eye Exam   ? ? Visual Acuity (ETDRS)   ? ?   Right Left  ? Dist [place] 20/40 -2 20/400  ? Dist ph [place] NI NI  ? ?  ?  ? ? Tonometry (Tonopen, 8:20 AM)   ? ?   Right Left  ? Pressure 1

## 2021-12-21 NOTE — Assessment & Plan Note (Signed)
OD today repeat injection antivegF at nearly 1 year since most recent injection.  Exacerbation recurrence most likely triggered by severe injury leading to prolonged hospitalization and likely periods of hypoxia during that period of time. ? ?I have encouraged patient to consider to resume CPAP therapy in order to maximize nightly oxygenation on a continued basis as an outpatient in addition to this therapy. ?

## 2021-12-23 ENCOUNTER — Encounter (INDEPENDENT_AMBULATORY_CARE_PROVIDER_SITE_OTHER): Payer: Self-pay | Admitting: Ophthalmology

## 2021-12-27 ENCOUNTER — Ambulatory Visit (INDEPENDENT_AMBULATORY_CARE_PROVIDER_SITE_OTHER): Payer: BC Managed Care – PPO | Admitting: Ophthalmology

## 2021-12-27 ENCOUNTER — Encounter (INDEPENDENT_AMBULATORY_CARE_PROVIDER_SITE_OTHER): Payer: BC Managed Care – PPO | Admitting: Ophthalmology

## 2021-12-27 ENCOUNTER — Other Ambulatory Visit: Payer: Self-pay

## 2021-12-27 ENCOUNTER — Encounter (INDEPENDENT_AMBULATORY_CARE_PROVIDER_SITE_OTHER): Payer: Self-pay | Admitting: Ophthalmology

## 2021-12-27 DIAGNOSIS — H353211 Exudative age-related macular degeneration, right eye, with active choroidal neovascularization: Secondary | ICD-10-CM

## 2021-12-27 DIAGNOSIS — H353231 Exudative age-related macular degeneration, bilateral, with active choroidal neovascularization: Secondary | ICD-10-CM | POA: Diagnosis not present

## 2021-12-27 DIAGNOSIS — H353221 Exudative age-related macular degeneration, left eye, with active choroidal neovascularization: Secondary | ICD-10-CM | POA: Diagnosis not present

## 2021-12-27 DIAGNOSIS — H35712 Central serous chorioretinopathy, left eye: Secondary | ICD-10-CM | POA: Diagnosis not present

## 2021-12-27 MED ORDER — AFLIBERCEPT 2MG/0.05ML IZ SOLN FOR KALEIDOSCOPE
2.0000 mg | INTRAVITREAL | Status: AC | PRN
  Administered 2021-12-27: 2 mg via INTRAVITREAL

## 2021-12-27 NOTE — Assessment & Plan Note (Signed)
OD improved 6 days after resumption of therapy ?

## 2021-12-27 NOTE — Therapy (Signed)
?OUTPATIENT PHYSICAL THERAPY TREATMENT NOTE/ERO ? ? ?Patient Name: [name] ?MRN: [id number] ?DOB:1956/09/15, 66 y.o., male ?Today's Date: 12/28/2021 ? ?PCP: Shayne AlkenBrown, Stephanie Delores, MD ?REFERRING PROVIDER: Luna KitchensBrown, Stephanie Delore* ? ? PT End of Session - 12/28/21 1544   ? ? Visit Number [id number]   ? Number of Visits 16   ? Date for PT Re-Evaluation 12/16/21   ? Authorization Type BCBS   ? PT Start Time 1545   ? PT Stop Time 1635   ? PT Time Calculation (min) 50 min   ? Activity Tolerance Patient tolerated treatment well;Patient limited by pain   ? [facility] tasks assessed/performed   ? ?  ?  ? ?  ? ? ? ? ? ? ? ? ? ? ?Past Medical History:  ?Diagnosis Date  ? COPD (chronic obstructive pulmonary disease) (HCC)   ? GERD (gastroesophageal reflux disease)   ? History of hepatitis C   ? treated several years ago through [facility]  ? Hypertension   ? ?Past Surgical History:  ?Procedure Laterality Date  ? CATARACT EXTRACTION, BILATERAL    ? EYE SURGERY Bilateral   ? IR THORACENTESIS ASP PLEURAL SPACE W/IMG GUIDE  09/14/2021  ? IR THORACENTESIS ASP PLEURAL SPACE W/IMG GUIDE  09/21/2021  ? ORIF PELVIC FRACTURE WITH PERCUTANEOUS SCREWS Right 07/29/2021  ? Procedure: SI SCREW FIXATION OF RIGHT PELVIC RING;  Surgeon: Myrene GalasHandy, Michael, MD;  Location: [facility];  Service: Orthopedics;  Laterality: Right;  ? ORTHOPEDIC SURGERY Bilateral   ? pt states fracture repairs to arms and legs.  ? REVERSE SHOULDER ARTHROPLASTY Left 09/23/2019  ? Procedure: REVERSE SHOULDER ARTHROPLASTY;  Surgeon: Jones Broomhandler, Justin, MD;  Location: [facility];  Service: Orthopedics;  Laterality: Left;  ? TRACHEOSTOMY TUBE PLACEMENT N/A 08/10/2021  ? Procedure: TRACHEOSTOMY;  Surgeon: Violeta Gelinashompson, Burke, MD;  [facility];  Service: General;  Laterality: N/A;  ? ?Patient Active Problem List  ? Diagnosis Date Noted  ? Acute on chronic respiratory failure with hypoxia (HCC) 09/08/2021  ? Traumatic fracture of ribs of right side with pneumothorax 09/08/2021   ? History of alcohol abuse 09/08/2021  ? Delirium due to another medical condition 09/08/2021  ? Healthcare associated bacterial pneumonia 09/08/2021  ? Anxiety disorder due to known physiological condition   ? Fall 07/27/2021  ? Screening for prostate cancer 01/27/2021  ? Elevated LFTs 01/27/2021  ? Former smoker 01/27/2021  ? Chronic respiratory failure with hypoxia (HCC) 11/08/2020  ? Obstructive sleep apnea hypopnea, severe 09/24/2020  ? Exudative age-related macular degeneration of left eye with active choroidal neovascularization (HCC) 01/28/2020  ? 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  ? Exudative age-related macular degeneration of right eye with active choroidal neovascularization (HCC) 01/28/2020  ? CAD (coronary artery disease) 10/16/2019  ? S/P reverse total shoulder arthroplasty, left 09/23/2019  ? Overweight (BMI 25.0-29.9) 08/13/2019  ? Hyperlipidemia 08/13/2019  ? Essential hypertension 08/13/2019  ? Gastroesophageal reflux disease 08/13/2019  ? Macular degeneration, wet (HCC) 11/09/2017  ? Lung nodules 10/28/2015  ? COPD mixed type (HCC) 10/01/2014  ? Chest x-ray abnormality 10/01/2014  ? ? ?REFERRING DIAG: pelvic fracture ? ?THERAPY DIAG:  ?Chronic right-sided low back pain with sciatica, sciatica laterality unspecified ? ?Muscle weakness (generalized) ? ?Difficulty in walking, not elsewhere classified ? ?PERTINENT HISTORY: ORIF Pelvic fx with percutaneous screws 07/29/21, reverse TSR Left,2021, tracheostomy 11/22 22, COPD, GERD , Hx of Hep C, HTN See medical history  ? ?SUBJECTIVE: I am hurting in my left  hip 5/10, and left knee 5/10  I have been trying to walk more and I did not take my medication today. I am wearing my left knee brace that helps me.  I am trying to be more active but I still get tired after about 1 and 1/2 mile  I have not walked more than 2 miles ? ? ?PAIN:  ?Are you having pain? No ?NPRS scale:No pain except with left hip with stretch ?Pain location: Rt low  back, sacrum, BIL hips knees L.R ?PAIN TYPE: aching and dull ?Pain description: constant  ?Aggravating factors: prolonged sitting ?Relieving factors: Walking, Tylenol ? ? ? ? ?OBJECTIVE:  ? *Unless otherwise noted, objective data collected previously* ?DIAGNOSTIC FINDINGS:  ?IMPRESSION: ?1. Comminuted bilateral pubic rami. Superior offset of the left ?superior ramus is stable from a recent CT but increased since ?December 2nd. ?2. Stable bilateral sacral cannulated screws. No new osseous ?abnormality identified. ?  ?PATIENT SURVEYS:  ?FOTO 28% predicted 56% ?11-25-21  59% predicted 56% ?  ?SCREENING FOR RED FLAGS: ?Bowel or bladder incontinence: No ?Spinal tumors: No ?Cauda equina syndrome: No no numbness noted ?Compression fracture: No ?Abdominal aneurysm: No ?  ?COGNITION: ?         Overall cognitive status: Within functional limits for tasks assessed              ?          ?SENSATION: ?         Light touch: Appears intact ?         Stereognosis: Appears intact ?         Hot/Cold: Appears intact ?         Proprioception: Appears intact ?  ?MUSCLE LENGTH: ?Hamstrings: Right 55 deg pain; Left 68 deg ?Thomas test: Right 25 deg; Left 20 deg retested 12-16-21  R 20, Left 20 ?  ?POSTURE:  ?Noted bil tremors noted bil UE ?  ?  ?PALPATION: ?Increased pain over bil lumbar paraspinal  TTP over R SI ?  ?LUMBARAROM/PROM ?  ?A/PROM A/PROM  ?10/21/2021 A/PROM ?12-16-21  ?Flexion Mid shin Able to touch toes bil  ?Extension 25% 40%  ?Right lateral flexion Finger tip to knee jt line Finger tip to knee jt lin  ?Left lateral flexion 4 inch finger tip above knee jt line 2inch finger tip above knee jt line  ?Right rotation 60% 75%  ?Left rotation 60% 75%  ? (Blank rows = not tested) ?  ?LE AROM/PROM: ?  ?A/PROM Right ?10/21/2021 Left ?10/21/2021 RIGHT ?12-16-21 LEFT ?12-16-21  ?Hip flexion 121 118 120 123  ?Hip extension        ?Hip abduction        ?Hip adduction        ?Hip internal rotation        ?Hip external rotation        ?Knee flexion  130 135 132 135  ?Knee extension -6 -3 -6 -3  ?Ankle dorsiflexion        ?Ankle plantarflexion        ?Ankle inversion        ?Ankle eversion        ? (Blank rows = not tested) ?  ?LE MMT: ?NT  with resistance due to pelvic fracture, standing to assess gross strength ?MMT Right ?10/21/2021 Left ?10/21/2021 Right ?11/18/2021 Left ?11/18/2021 Left  ?12-02-21 Right ?12-16-21 Left  ?12-16-21  ?Hip flexion 3-/5 3-/5 5/5 5/5 5/5 4- 5  ?Hip extension 3-/5 3-/5 3+/5 4/5  4/5 4- 4-  ?Hip abduction 3-/5 3-/5 4/5 3+/5 4-/5 4- 4-  ?Hip adduction           ?Hip internal rotation           ?Hip external rotation           ?Knee flexion           ?Knee extension 3-/5 3-/5 5/5 4+/5 4+/5 4 4+  ?Ankle dorsiflexion           ?Ankle plantarflexion        4 4  ?Ankle inversion           ?Ankle eversion           ? (Blank rows = not tested) ?  ?LUMBAR SPECIAL TESTS:  ?Not tested due to pelvic fracture ?  ?FUNCTIONAL TESTS:  ?30 sec sit to stand 7 in 30 sec, unable to do 5 x STS must use hands ?5 x sts  18.78 sec no use of hands  pain in knees ?12-02-21 5x STS 16.74 sec no use of hands  ?12-16-21 5 x STS 13.72 sec  no use of hands ?12-28-21 5 x STS 10.88sec ? ? ? ?11/04/2021: : 677ft, 1.69 ft/sec gait speed ?12-02-21   966ft ?12-16-21  1201 ft  ? ?TUG - 11.50 sec, dual task 12.95 holding glass of water ?Floor to mat x fer ?  ?GAIT: ?Distance walked: 150 ?Assistive device utilized: Environmental consultant - 2 wheeled ?Level of assistance: Modified independence ?Comments: Pt gait velocity 1.83 ft/sec decreased stride length. Pt walker adjusted for pt height ? 11-25-21 3.07 ft/sec    12-02-21 3.39 ft/sec  12-16-21  3.63ft/sec ?  ?  ?TODAY'S TREATMENT  ? ?[facility] Adult PT Treatment:                                                DATE: 12-28-21 ?Therapeutic Exercise: ?Side knee plank 3x30sec BIL ?Supine bicycles 3x16 ?Knee planks 3x1 minute ?Standing Cybex hip flexion with 25# 1x10 BILthen 50# 1x 10 BIL ?Standing Cybex hip extension with 25# 1x10 BIL,then 50# 1 x 10  BIL ?Standing Cybex hip abduction with 25# 1x10 BIL,1 x 10 BIL ?Manual Therapy: ?Overpressure and stretch of BIL hips, restricted on Left IR ?Neuromuscular re-ed: ?SLS on floor in corner ?SLS on there

## 2021-12-27 NOTE — Progress Notes (Signed)
? ? ?12/27/2021 ? ?  ? ?CHIEF COMPLAINT ?Patient presents for  ?Chief Complaint  ?Patient presents with  ? Macular Degeneration  ? ? ? ? ?HISTORY OF PRESENT ILLNESS: ?[name] is a 66 y.o. male who presents to the clinic today for:  ? ?HPI   ?1 week for dilate, OS EYLEA OCT. ?Pt stated no vision changes.  ?Pt denies floaters and FOL. ? ?Last edited by Angeline SlimMa, San on 12/27/2021  1:46 PM.  ?  ? ? ?Referring physician: ?Shayne AlkenBrown, Stephanie Delores, MD ?[address] ?[place],  [place] [id number] ? ?HISTORICAL INFORMATION:  ? ?Selected notes from the MEDICAL RECORD NUMBER ?  ? ?Lab Results  ?Component Value Date  ? HGBA1C 5.4 07/28/2021  ?  ? ?CURRENT MEDICATIONS: ?No current outpatient medications on file. (Ophthalmic Drugs)  ? ?No current facility-administered medications for this visit. (Ophthalmic Drugs)  ? ?Current Outpatient Medications (Other)  ?Medication Sig  ? acetaminophen (TYLENOL) 500 MG tablet Place 2 tablets (1,000 mg total) into feeding tube every 6 (six) hours.  ? aspirin 81 MG chewable tablet Place 1 tablet (81 mg total) into feeding tube daily.  ? bethanechol (URECHOLINE) 25 MG tablet Place 1 tablet (25 mg total) into feeding tube 3 (three) times daily.  ? Chlorhexidine Gluconate Cloth 2 % PADS Apply 6 each topically daily.  ? cholecalciferol (VITAMIN D) 25 MCG tablet Place 2 tablets (2,000 Units total) into feeding tube daily.  ? enoxaparin (LOVENOX) 40 MG/0.4ML injection Inject 0.4 mLs (40 mg total) into the skin every 12 (twelve) hours.  ? folic acid (FOLVITE) 1 MG tablet Place 1 tablet (1 mg total) into feeding tube daily.  ? gabapentin (NEURONTIN) 250 MG/5ML solution Place 6 mLs (300 mg total) into feeding tube every 8 (eight) hours.  ? glycopyrrolate (ROBINUL) 1 MG tablet Place 2 tablets (2 mg total) into feeding tube 3 (three) times daily.  ? guaiFENesin (ROBITUSSIN) 100 MG/5ML liquid Place 10 mLs into feeding tube every 4 (four) hours.  ? haloperidol lactate (HALDOL) 5 MG/ML injection Inject 1 mL (5 mg  total) into the vein every 6 (six) hours as needed.  ? hydrocortisone cream 1 % Apply topically 3 (three) times daily.  ? hydrOXYzine (ATARAX) 25 MG tablet Place 1 tablet (25 mg total) into feeding tube 4 (four) times daily as needed for anxiety (Give ~1 hr before vent trial; 1st line for anxiety prior to klonopin.).  ? hydrOXYzine (ATARAX) 25 MG tablet Place 1 tablet (25 mg total) into feeding tube 4 (four) times daily.  ? ibuprofen (ADVIL) 600 MG tablet Place 1 tablet (600 mg total) into feeding tube every 6 (six) hours as needed for moderate pain.  ? insulin aspart (NOVOLOG) 100 UNIT/ML injection Inject 0-15 Units into the skin every 4 (four) hours. (Patient not taking: Reported on 10/21/2021)  ? insulin glargine-yfgn (SEMGLEE) 100 UNIT/ML injection Inject 0.05 mLs (5 Units total) into the skin daily. (Patient not taking: Reported on 10/21/2021)  ? ipratropium-albuterol (DUONEB) 0.5-2.5 (3) MG/3ML SOLN Take 3 mLs by nebulization every 6 (six) hours as needed.  ? lidocaine (LIDODERM) 5 % Place 1 patch onto the skin daily. Remove & Discard patch within 12 hours or as directed by MD  ? melatonin 3 MG TABS tablet Place 1 tablet (3 mg total) into feeding tube daily at 6 PM. (Patient not taking: Reported on 10/21/2021)  ? methocarbamol 1000 MG TABS Place 1,000 mg into feeding tube every 8 (eight) hours. (Patient not taking: Reported  on 10/21/2021)  ? metoprolol tartrate (LOPRESSOR) 25 mg/10 mL SUSP Place 10 mLs (25 mg total) into feeding tube 2 (two) times daily. (Patient not taking: Reported on 10/21/2021)  ? Multiple Vitamin (MULTIVITAMIN WITH MINERALS) TABS tablet Place 1 tablet into feeding tube daily.  ? Nutritional Supplements (FEEDING SUPPLEMENT, JEVITY 1.5 CAL/FIBER,) LIQD Place 1,000 mLs into feeding tube continuous. (Patient not taking: Reported on 10/21/2021)  ? Nutritional Supplements (FEEDING SUPPLEMENT, PROSOURCE TF,) liquid Place 90 mLs into feeding tube 2 (two) times daily. (Patient not taking: Reported on  10/21/2021)  ? ondansetron (ZOFRAN-ODT) 4 MG disintegrating tablet Take 1 tablet (4 mg total) by mouth every 6 (six) hours as needed for nausea.  ? oxyCODONE (ROXICODONE) 5 MG/5ML solution Place 5-10 mLs (5-10 mg total) into feeding tube every 3 (three) hours as needed for moderate pain or severe pain (5mg  for moderate pain, 10mg  for severe pain). (Patient not taking: Reported on 10/21/2021)  ? pantoprazole sodium (PROTONIX) 40 mg Place 40 mg into feeding tube daily.  ? polyethylene glycol (MIRALAX / GLYCOLAX) 17 g packet Place 17 g into feeding tube daily as needed for mild constipation. (Patient not taking: Reported on 10/21/2021)  ? pravastatin (PRAVACHOL) 40 MG tablet Place 1 tablet (40 mg total) into feeding tube daily.  ? QUEtiapine (SEROQUEL) 100 MG tablet Place 1 tablet (100 mg total) into feeding tube every morning. (Patient not taking: Reported on 10/21/2021)  ? QUEtiapine (SEROQUEL) 200 MG tablet Place 1 tablet (200 mg total) into feeding tube at bedtime. (Patient not taking: Reported on 10/21/2021)  ? silodosin (RAPAFLO) 8 MG CAPS capsule Place 1 capsule (8 mg total) into feeding tube daily with breakfast. (Patient not taking: Reported on 10/21/2021)  ? sodium chloride 0.9 % infusion Inject 250 mLs into the vein continuous. (Patient not taking: Reported on 10/21/2021)  ? thiamine (B-1) 100 MG/ML injection Inject 1 mL (100 mg total) into the vein daily. (Patient not taking: Reported on 10/21/2021)  ? thiamine 100 MG tablet Place 1 tablet (100 mg total) into feeding tube daily. (Patient not taking: Reported on 10/21/2021)  ? traZODone (DESYREL) 50 MG tablet Place 0.5 tablets (25 mg total) into feeding tube at bedtime.  ? Water For Irrigation, Sterile (FREE WATER) SOLN Place 250 mLs into feeding tube every 6 (six) hours.  ? ?No current facility-administered medications for this visit. (Other)  ? ? ? ? ?REVIEW OF SYSTEMS: ?ROS   ?Negative for: Constitutional, Gastrointestinal, Neurological, Skin, Genitourinary,  Musculoskeletal, HENT, Endocrine, Cardiovascular, Eyes, Respiratory, Psychiatric, Allergic/Imm, Heme/Lymph ?Last edited by 12/19/2021 on 12/27/2021  1:46 PM.  ?  ? ? ? ?ALLERGIES ?No Known Allergies ? ?PAST MEDICAL HISTORY ?Past Medical History:  ?Diagnosis Date  ? COPD (chronic obstructive pulmonary disease) (HCC)   ? GERD (gastroesophageal reflux disease)   ? History of hepatitis C   ? treated several years ago through [facility]  ? Hypertension   ? ?Past Surgical History:  ?Procedure Laterality Date  ? CATARACT EXTRACTION, BILATERAL    ? EYE SURGERY Bilateral   ? IR THORACENTESIS ASP PLEURAL SPACE W/IMG GUIDE  09/14/2021  ? IR THORACENTESIS ASP PLEURAL SPACE W/IMG GUIDE  09/21/2021  ? ORIF PELVIC FRACTURE WITH PERCUTANEOUS SCREWS Right 07/29/2021  ? Procedure: SI SCREW FIXATION OF RIGHT PELVIC RING;  Surgeon: 11/19/2021, MD;  Location: [facility];  Service: Orthopedics;  Laterality: Right;  ? ORTHOPEDIC SURGERY Bilateral   ? pt states fracture repairs to arms and legs.  ? REVERSE SHOULDER ARTHROPLASTY Left  09/23/2019  ? Procedure: REVERSE SHOULDER ARTHROPLASTY;  Surgeon: Jones Broom, MD;  Location: [facility];  Service: Orthopedics;  Laterality: Left;  ? TRACHEOSTOMY TUBE PLACEMENT N/A 08/10/2021  ? Procedure: TRACHEOSTOMY;  Surgeon: Violeta Gelinas, MD;  Location: [facility] OR;  Service: General;  Laterality: N/A;  ? ? ?FAMILY HISTORY ?Family History  ?Problem Relation Age of Onset  ? Heart disease Mother   ? Hypertension Father   ? ? ?SOCIAL HISTORY ?Social History  ? ?Tobacco Use  ? Smoking status: Former  ?  Packs/day: 1.00  ?  Years: 35.00  ?  Pack years: 35.00  ?  Types: Cigarettes  ?  Start date: 17  ?  Quit date: 10/01/2010  ?  Years since quitting: 11.2  ? Smokeless tobacco: Never  ?Vaping Use  ? Vaping Use: Never used  ?Substance Use Topics  ? Alcohol use: Yes  ?  Alcohol/week: 42.0 standard drinks  ?  Types: 42 Cans of beer per week  ?  Comment: daily - 6 drinks a day  ? Drug use: Yes  ?  Types: Marijuana  ?  Comment:  last marijuana 2-3 weeks ago  ? ?  ? ?  ? ?OPHTHALMIC EXAM: ? ?Base Eye Exam   ? ? Visual Acuity (ETDRS)   ? ?   Right Left  ? Dist [place] 20/50 20/200  ? Dist ph [place] NI 20/150  ? ?  ?  ? ? Tonometry (Tonopen, 1:51 PM)   ? ?   Right

## 2021-12-27 NOTE — Assessment & Plan Note (Signed)
Component of wet AMD OS 

## 2021-12-27 NOTE — Assessment & Plan Note (Signed)
Resume therapy today at 6 months due to recent injury and illness ?

## 2021-12-28 ENCOUNTER — Ambulatory Visit: Payer: BC Managed Care – PPO | Attending: Family Medicine | Admitting: Physical Therapy

## 2021-12-28 DIAGNOSIS — M5440 Lumbago with sciatica, unspecified side: Secondary | ICD-10-CM | POA: Diagnosis present

## 2021-12-28 DIAGNOSIS — R262 Difficulty in walking, not elsewhere classified: Secondary | ICD-10-CM | POA: Insufficient documentation

## 2021-12-28 DIAGNOSIS — M6281 Muscle weakness (generalized): Secondary | ICD-10-CM | POA: Insufficient documentation

## 2021-12-28 DIAGNOSIS — G8929 Other chronic pain: Secondary | ICD-10-CM | POA: Insufficient documentation

## 2022-01-03 NOTE — Therapy (Signed)
?OUTPATIENT PHYSICAL THERAPY TREATMENT NOTE/ERO ? ? ?Patient Name: [name] ?MRN: [id number] ?DOB:May 02, 1956, 66 y.o., male ?Today's Date: 01/04/2022 ? ?PCP: Rosaria Ferries, MD ?REFERRING PROVIDER: Satira Sark Delore* ? ? PT End of Session - 01/04/22 1331   ? ? Visit Number [id number]   ? Number of Visits 16   ? Date for PT Re-Evaluation 02/10/22   ? Authorization Type BCBS   ? PT Start Time 1330   ? PT Stop Time [id number]   ? PT Time Calculation (min) 45 min   ? Activity Tolerance Patient tolerated treatment well;Patient limited by pain   ? Behavior During Therapy [facility] for tasks assessed/performed   ? ?  ?  ? ?  ? ? ? ? ? ? ? ? ? ? ? ?Past Medical History:  ?Diagnosis Date  ? COPD (chronic obstructive pulmonary disease) ([place])   ? GERD (gastroesophageal reflux disease)   ? History of hepatitis C   ? treated several years ago through [facility]  ? Hypertension   ? ?Past Surgical History:  ?Procedure Laterality Date  ? CATARACT EXTRACTION, BILATERAL    ? EYE SURGERY Bilateral   ? IR THORACENTESIS ASP PLEURAL SPACE W/IMG GUIDE  09/14/2021  ? IR THORACENTESIS ASP PLEURAL SPACE W/IMG GUIDE  09/21/2021  ? ORIF PELVIC FRACTURE WITH PERCUTANEOUS SCREWS Right 07/29/2021  ? Procedure: SI SCREW FIXATION OF RIGHT PELVIC RING;  Surgeon: Altamese Centennial, MD;  Location: [facility];  Service: Orthopedics;  Laterality: Right;  ? ORTHOPEDIC SURGERY Bilateral   ? pt states fracture repairs to arms and legs.  ? REVERSE SHOULDER ARTHROPLASTY Left 09/23/2019  ? Procedure: REVERSE SHOULDER ARTHROPLASTY;  Surgeon: Tania Ade, MD;  Location: [facility];  Service: Orthopedics;  Laterality: Left;  ? TRACHEOSTOMY TUBE PLACEMENT N/A 08/10/2021  ? Procedure: TRACHEOSTOMY;  Surgeon: Georganna Skeans, MD;  Location: [facility];  Service: General;  Laterality: N/A;  ? ?Patient Active Problem List  ? Diagnosis Date Noted  ? Acute on chronic respiratory failure with hypoxia ([place]) 09/08/2021  ? Traumatic fracture of ribs of right side with pneumothorax 09/08/2021   ? History of alcohol abuse 09/08/2021  ? Delirium due to another medical condition 09/08/2021  ? Healthcare associated bacterial pneumonia 09/08/2021  ? Anxiety disorder due to known physiological condition   ? Fall 07/27/2021  ? Screening for prostate cancer 01/27/2021  ? Elevated LFTs 01/27/2021  ? Former smoker 01/27/2021  ? Chronic respiratory failure with hypoxia ([place]) 11/08/2020  ? Obstructive sleep apnea hypopnea, severe 09/24/2020  ? Exudative age-related macular degeneration of left eye with active choroidal neovascularization ([place]) 01/28/2020  ? 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  ? Exudative age-related macular degeneration of right eye with active choroidal neovascularization ([place]) 01/28/2020  ? CAD (coronary artery disease) 10/16/2019  ? S/P reverse total shoulder arthroplasty, left 09/23/2019  ? Overweight (BMI 25.0-29.9) 08/13/2019  ? Hyperlipidemia 08/13/2019  ? Essential hypertension 08/13/2019  ? Gastroesophageal reflux disease 08/13/2019  ? Macular degeneration, wet ([place]) 11/09/2017  ? Lung nodules 10/28/2015  ? COPD mixed type ([place]) 10/01/2014  ? Chest x-ray abnormality 10/01/2014  ? ? ?REFERRING DIAG: pelvic fracture ? ?THERAPY DIAG:  ?Chronic right-sided low back pain with sciatica, sciatica laterality unspecified ? ?Muscle weakness (generalized) ? ?Difficulty in walking, not elsewhere classified ? ?PERTINENT HISTORY: ORIF Pelvic fx with percutaneous screws 07/29/21, reverse TSR Left,2021, tracheostomy 11/22 22, COPD, GERD , Hx of Hep C, HTN See medical history  ? ?SUBJECTIVE: I have pain in my  left pelvis 1/10 but I am doing well.  I walk about a mile a day. ? ? ?PAIN:  ?Are you having pain? Yes ?NPRS scale:No pain except with left pelvis  ?Pain location: Rt low back, sacrum, BIL hips knees L.R ?PAIN TYPE: aching and dull ?Pain description: constant  ?Aggravating factors: prolonged sitting ?Relieving factors: Walking, Tylenol ? ? ? ? ?OBJECTIVE:  ? *Unless otherwise  noted, objective data collected previously* ?DIAGNOSTIC FINDINGS:  ?IMPRESSION: ?1. Comminuted bilateral pubic rami. Superior offset of the left ?superior ramus is stable from a recent CT but increased since ?December 2nd. ?2. Stable bilateral sacral cannulated screws. No new osseous ?abnormality identified. ?  ?PATIENT SURVEYS:  ?FOTO 28% predicted 56% ?11-25-21  59% predicted 56% ?  ?SCREENING FOR RED FLAGS: ?Bowel or bladder incontinence: No ?Spinal tumors: No ?Cauda equina syndrome: No no numbness noted ?Compression fracture: No ?Abdominal aneurysm: No ?  ?COGNITION: ?         Overall cognitive status: Within functional limits for tasks assessed              ?          ?SENSATION: ?         Light touch: Appears intact ?         Stereognosis: Appears intact ?         Hot/Cold: Appears intact ?         Proprioception: Appears intact ?  ?MUSCLE LENGTH: ?Hamstrings: Right 55 deg pain; Left 68 deg ?Thomas test: Right 25 deg; Left 20 deg retested 12-16-21  R 20, Left 20 ?  ?POSTURE:  ?Noted bil tremors noted bil UE ?  ?  ?PALPATION: ?Increased pain over bil lumbar paraspinal  TTP over R SI ?  ?LUMBARAROM/PROM ?  ?A/PROM A/PROM  ?10/21/2021 A/PROM ?12-16-21  ?Flexion Mid shin Able to touch toes bil  ?Extension 25% 40%  ?Right lateral flexion Finger tip to knee jt line Finger tip to knee jt lin  ?Left lateral flexion 4 inch finger tip above knee jt line 2inch finger tip above knee jt line  ?Right rotation 60% 75%  ?Left rotation 60% 75%  ? (Blank rows = not tested) ?  ?LE AROM/PROM: ?  ?A/PROM Right ?10/21/2021 Left ?10/21/2021 RIGHT ?12-16-21 LEFT ?12-16-21  ?Hip flexion 121 118 120 123  ?Hip extension        ?Hip abduction        ?Hip adduction        ?Hip internal rotation        ?Hip external rotation        ?Knee flexion 130 135 132 135  ?Knee extension -6 -3 -6 -3  ?Ankle dorsiflexion        ?Ankle plantarflexion        ?Ankle inversion        ?Ankle eversion        ? (Blank rows = not tested) ?  ?LE MMT: ?NT  with resistance  due to pelvic fracture, standing to assess gross strength ?MMT Right ?10/21/2021 Left ?10/21/2021 Right ?11/18/2021 Left ?11/18/2021 Left  ?12-02-21 Right ?12-16-21 Left  ?12-16-21  ?Hip flexion 3-/5 3-/5 5/5 5/5 5/5 4- 5  ?Hip extension 3-/5 3-/5 3+/5 4/5 4/5 4- 4-  ?Hip abduction 3-/5 3-/5 4/5 3+/5 4-/5 4- 4-  ?Hip adduction           ?Hip internal rotation           ?Hip external rotation           ?  Knee flexion           ?Knee extension 3-/5 3-/5 5/5 4+/5 4+/5 4 4+  ?Ankle dorsiflexion           ?Ankle plantarflexion        4 4  ?Ankle inversion           ?Ankle eversion           ? (Blank rows = not tested) ?  ?LUMBAR SPECIAL TESTS:  ?Not tested due to pelvic fracture ?  ?FUNCTIONAL TESTS:  ?30 sec sit to stand 7 in 30 sec, unable to do 5 x STS must use hands ?5 x sts  18.78 sec no use of hands  pain in knees ?12-02-21 5x STS 16.74 sec no use of hands  ?12-16-21 5 x STS 13.72 sec  no use of hands ?12-28-21 5 x STS 10.88sec ? ? ? ?11/04/2021: 6MWT: 625ft, 1.69 ft/sec gait speed ?12-02-21 6MWT   966ft ?12-16-21 6MWT  1201 ft  ? ?TUG - 11.50 sec, dual task 12.95 holding glass of water ?Floor to mat x fer ?  ?GAIT: ?Distance walked: 150 ?Assistive device utilized: Environmental consultant - 2 wheeled ?Level of assistance: Modified independence ?Comments: Pt gait velocity 1.83 ft/sec decreased stride length. Pt walker adjusted for pt height ? 11-25-21 3.07 ft/sec    12-02-21 3.39 ft/sec  12-16-21  3.60ft/sec ?  ?  ?TODAY'S TREATMENT  ?[facility] Adult PT Treatment:                                                DATE: 01-04-22 ? Gait velocity - 3.80 ft/sec ?Therapeutic Exercise: ?Standing Cybex hip flexion with 50# 2x10 on R and then L ?Standing Cybex hip abduction with 50# 1x10 on R and then L ?Standing Cybex hip extension with 50# 1x10 on R and then L ?Trap bar 75 # x 6 with PT and verbal cues and then 3 x with 75# ?Hip flexor stretch R and then L 30 to 60 sec x 2 off edge of mat with one knee to chest ?Child's pose forward and side to side and back to forward  30 sec x 2 each ? ?Therapeutic Activity: ?75# dead lift elevated in rack 18 inches 2 x 6 ?Sled loaded with 30 # pushed 60 feet time 2 ?Climbing ladder 4 steps x 3 carrying 10 lb independently ?Lifting 2

## 2022-01-04 ENCOUNTER — Ambulatory Visit: Payer: BC Managed Care – PPO | Admitting: Physical Therapy

## 2022-01-04 ENCOUNTER — Encounter: Payer: Self-pay | Admitting: Physical Therapy

## 2022-01-04 DIAGNOSIS — M6281 Muscle weakness (generalized): Secondary | ICD-10-CM

## 2022-01-04 DIAGNOSIS — G8929 Other chronic pain: Secondary | ICD-10-CM

## 2022-01-04 DIAGNOSIS — R262 Difficulty in walking, not elsewhere classified: Secondary | ICD-10-CM

## 2022-01-04 DIAGNOSIS — M5440 Lumbago with sciatica, unspecified side: Secondary | ICD-10-CM | POA: Diagnosis not present

## 2022-01-04 NOTE — Patient Instructions (Addendum)
? ? ?  Voncille Lo, PT, ATRIC ?Certified Exercise Expert for the Aging Adult  ?01/04/22 2:13 PM ?Phone: [phone] ?Fax: [phone]  ?

## 2022-01-10 NOTE — Therapy (Signed)
?OUTPATIENT PHYSICAL THERAPY TREATMENT NOTE/ERO ? ? ?Patient Name: [name] ?MRN: [id number] ?DOB:September 16, 1956, 66 y.o., male ?Today's Date: 01/11/2022 ? ?PCP: Alease Medina, MD ?REFERRING PROVIDER: Romualdo Bolk* ? ? PT End of Session - 01/11/22 1331   ? ? Visit Number 12   ? Number of Visits 16   ? Date for PT Re-Evaluation 02/10/22   ? Authorization Type BCBS   ? PT Start Time 1330   ? PT Stop Time 1420   ? PT Time Calculation (min) 50 min   ? Equipment Utilized During Treatment Gait belt   ? Activity Tolerance Patient tolerated treatment well;Patient limited by pain   ? Behavior During Therapy [facility] for tasks assessed/performed   ? ?  ?  ? ?  ? ? ? ? ? ? ? ? ? ? ? ? ?Past Medical History:  ?Diagnosis Date  ? COPD (chronic obstructive pulmonary disease) (HCC)   ? GERD (gastroesophageal reflux disease)   ? History of hepatitis C   ? treated several years ago through [facility]  ? Hypertension   ? ?Past Surgical History:  ?Procedure Laterality Date  ? CATARACT EXTRACTION, BILATERAL    ? EYE SURGERY Bilateral   ? IR THORACENTESIS ASP PLEURAL SPACE W/IMG GUIDE  09/14/2021  ? IR THORACENTESIS ASP PLEURAL SPACE W/IMG GUIDE  09/21/2021  ? ORIF PELVIC FRACTURE WITH PERCUTANEOUS SCREWS Right 07/29/2021  ? Procedure: SI SCREW FIXATION OF RIGHT PELVIC RING;  Surgeon: Myrene Galas, MD;  Location: [facility] OR;  Service: Orthopedics;  Laterality: Right;  ? ORTHOPEDIC SURGERY Bilateral   ? pt states fracture repairs to arms and legs.  ? REVERSE SHOULDER ARTHROPLASTY Left 09/23/2019  ? Procedure: REVERSE SHOULDER ARTHROPLASTY;  Surgeon: Jones Broom, MD;  Location: [facility] ORS;  Service: Orthopedics;  Laterality: Left;  ? TRACHEOSTOMY TUBE PLACEMENT N/A 08/10/2021  ? Procedure: TRACHEOSTOMY;  Surgeon: Violeta Gelinas, MD;  Location: [facility] OR;  Service: General;  Laterality: N/A;  ? ?Patient Active Problem List  ? Diagnosis Date Noted  ? Acute on chronic respiratory failure with hypoxia (HCC) 09/08/2021  ? Traumatic fracture of ribs  of right side with pneumothorax 09/08/2021  ? History of alcohol abuse 09/08/2021  ? Delirium due to another medical condition 09/08/2021  ? Healthcare associated bacterial pneumonia 09/08/2021  ? Anxiety disorder due to known physiological condition   ? Fall 07/27/2021  ? Screening for prostate cancer 01/27/2021  ? Elevated LFTs 01/27/2021  ? Former smoker 01/27/2021  ? Chronic respiratory failure with hypoxia (HCC) 11/08/2020  ? Obstructive sleep apnea hypopnea, severe 09/24/2020  ? Exudative age-related macular degeneration of left eye with active choroidal neovascularization (HCC) 01/28/2020  ? 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  ? Exudative age-related macular degeneration of right eye with active choroidal neovascularization (HCC) 01/28/2020  ? CAD (coronary artery disease) 10/16/2019  ? S/P reverse total shoulder arthroplasty, left 09/23/2019  ? Overweight (BMI 25.0-29.9) 08/13/2019  ? Hyperlipidemia 08/13/2019  ? Essential hypertension 08/13/2019  ? Gastroesophageal reflux disease 08/13/2019  ? Macular degeneration, wet (HCC) 11/09/2017  ? Lung nodules 10/28/2015  ? COPD mixed type (HCC) 10/01/2014  ? Chest x-ray abnormality 10/01/2014  ? ? ?REFERRING DIAG: pelvic fracture ? ?THERAPY DIAG:  ?Chronic right-sided low back pain with sciatica, sciatica laterality unspecified ? ?Muscle weakness (generalized) ? ?Difficulty in walking, not elsewhere classified ? ?PERTINENT HISTORY: ORIF Pelvic fx with percutaneous screws 07/29/21, reverse TSR Left,2021, tracheostomy 11/22 22, COPD, GERD , Hx of Hep C, HTN See  medical history  ? ?SUBJECTIVE: I am walking one mile at a time.  I rest about 45 minutes in between. I have pain in my low back about a 1-2 /10  just nagging ? ? ?PAIN:  ?Are you having pain? Yes ?NPRS scale:2/10 low back  ?Pain location: Rt low back, sacrum, BIL hips knees L.R ?PAIN TYPE: aching and dull ?Pain description: constant  ?Aggravating factors: prolonged sitting ?Relieving  factors: Walking, Tylenol ? ? ? ? ?OBJECTIVE:  ? *Unless otherwise noted, objective data collected previously* ?DIAGNOSTIC FINDINGS:  ?IMPRESSION: ?1. Comminuted bilateral pubic rami. Superior offset of the left ?superior ramus is stable from a recent CT but increased since ?December 2nd. ?2. Stable bilateral sacral cannulated screws. No new osseous ?abnormality identified. ?  ?PATIENT SURVEYS:  ?FOTO 28% predicted 56% ?11-25-21  59% predicted 56% ?  ?SCREENING FOR RED FLAGS: ?Bowel or bladder incontinence: No ?Spinal tumors: No ?Cauda equina syndrome: No no numbness noted ?Compression fracture: No ?Abdominal aneurysm: No ?  ?COGNITION: ?         Overall cognitive status: Within functional limits for tasks assessed              ?          ?SENSATION: ?         Light touch: Appears intact ?         Stereognosis: Appears intact ?         Hot/Cold: Appears intact ?         Proprioception: Appears intact ?  ?MUSCLE LENGTH: ?Hamstrings: Right 55 deg pain; Left 68 deg ?Thomas test: Right 25 deg; Left 20 deg retested 12-16-21  R 20, Left 20 ?  ?POSTURE:  ?Noted bil tremors noted bil UE ?  ?  ?PALPATION: ?Increased pain over bil lumbar paraspinal  TTP over R SI ?  ?LUMBARAROM/PROM ?  ?A/PROM A/PROM  ?10/21/2021 A/PROM ?12-16-21  ?Flexion Mid shin Able to touch toes bil  ?Extension 25% 40%  ?Right lateral flexion Finger tip to knee jt line Finger tip to knee jt lin  ?Left lateral flexion 4 inch finger tip above knee jt line 2inch finger tip above knee jt line  ?Right rotation 60% 75%  ?Left rotation 60% 75%  ? (Blank rows = not tested) ?  ?LE AROM/PROM: ?  ?A/PROM Right ?10/21/2021 Left ?10/21/2021 RIGHT ?12-16-21 LEFT ?12-16-21  ?Hip flexion 121 118 120 123  ?Hip extension        ?Hip abduction        ?Hip adduction        ?Hip internal rotation        ?Hip external rotation        ?Knee flexion 130 135 132 135  ?Knee extension -6 -3 -6 -3  ?Ankle dorsiflexion        ?Ankle plantarflexion        ?Ankle inversion        ?Ankle eversion         ? (Blank rows = not tested) ?  ?LE MMT: ?NT  with resistance due to pelvic fracture, standing to assess gross strength ?MMT Right ?10/21/2021 Left ?10/21/2021 Right ?11/18/2021 Left ?11/18/2021 Left  ?12-02-21 Right ?12-16-21 Left  ?12-16-21  ?Hip flexion 3-/5 3-/5 5/5 5/5 5/5 4- 5  ?Hip extension 3-/5 3-/5 3+/5 4/5 4/5 4- 4-  ?Hip abduction 3-/5 3-/5 4/5 3+/5 4-/5 4- 4-  ?Hip adduction           ?Hip internal rotation           ?  Hip external rotation           ?Knee flexion           ?Knee extension 3-/5 3-/5 5/5 4+/5 4+/5 4 4+  ?Ankle dorsiflexion           ?Ankle plantarflexion        4 4  ?Ankle inversion           ?Ankle eversion           ? (Blank rows = not tested) ?  ?LUMBAR SPECIAL TESTS:  ?Not tested due to pelvic fracture ?  ?FUNCTIONAL TESTS:  ?30 sec sit to stand 7 in 30 sec, unable to do 5 x STS must use hands ?5 x sts  18.78 sec no use of hands  pain in knees ?12-02-21 5x STS 16.74 sec no use of hands  ?12-16-21 5 x STS 13.72 sec  no use of hands ?12-28-21 5 x STS 10.88sec ? ? ? ?11/04/2021: : 619ft, 1.69 ft/sec gait speed ?12-02-21   927ft ?12-16-21  1201 ft  ? ?TUG - 11.50 sec, dual task 12.95 holding glass of water ?Floor to mat x fer ? ?DGI 23/24 ?  ?GAIT: ?Distance walked: 150 ?Assistive device utilized: Environmental consultant - 2 wheeled ?Level of assistance: Modified independence ?Comments: Pt gait velocity 1.83 ft/sec decreased stride length. Pt walker adjusted for pt height ? 11-25-21 3.07 ft/sec    12-02-21 3.39 ft/sec  12-16-21  3.43ft/sec ?  ?  ?TODAY'S TREATMENT  ?[facility] Adult PT Treatment:                                                DATE: 01-11-22 ?DGI 23/24 ? ?Therapeutic Exercise: ?Child's pose forward and side to side and back to forward 30 sec x 2 each ?Spanish squats with 25# and Green power band x 10 then without 1 x 10 ?Regular squats with 30 # 1 x 8 ?Plank on elbow 13 sec then 25 sec  then 30 sec  with 1 min rest between some pain in r shld ?Star pattern GTB 2 x 10 ?Horizontal abduction GTB 2 x  10 ?Deadlift trap bar 3 x with back irritation ?Then deadlift facing away from elevated surface  14 in 1 x 10 then 8 inch 1 x10 both with 25 # KB ?Manual Therapy: ?STW-  Bil QL stw and lumber paraspinal.

## 2022-01-11 ENCOUNTER — Encounter: Payer: Self-pay | Admitting: Physical Therapy

## 2022-01-11 ENCOUNTER — Ambulatory Visit: Payer: BC Managed Care – PPO | Admitting: Physical Therapy

## 2022-01-11 DIAGNOSIS — R262 Difficulty in walking, not elsewhere classified: Secondary | ICD-10-CM

## 2022-01-11 DIAGNOSIS — G8929 Other chronic pain: Secondary | ICD-10-CM

## 2022-01-11 DIAGNOSIS — M6281 Muscle weakness (generalized): Secondary | ICD-10-CM

## 2022-01-11 DIAGNOSIS — M5440 Lumbago with sciatica, unspecified side: Secondary | ICD-10-CM | POA: Diagnosis not present

## 2022-01-11 NOTE — Patient Instructions (Addendum)
? ? ? ?  Voncille Lo, PT, ATRIC ?Certified Exercise Expert for the Aging Adult  ?01/11/22 2:26 PM ?Phone: [phone] ?Fax: [phone]  ? ?

## 2022-01-18 ENCOUNTER — Encounter: Payer: Self-pay | Admitting: Physical Therapy

## 2022-01-18 ENCOUNTER — Ambulatory Visit: Payer: BC Managed Care – PPO | Attending: Family Medicine | Admitting: Physical Therapy

## 2022-01-18 DIAGNOSIS — M5440 Lumbago with sciatica, unspecified side: Secondary | ICD-10-CM | POA: Insufficient documentation

## 2022-01-18 DIAGNOSIS — R262 Difficulty in walking, not elsewhere classified: Secondary | ICD-10-CM

## 2022-01-18 DIAGNOSIS — G8929 Other chronic pain: Secondary | ICD-10-CM

## 2022-01-18 DIAGNOSIS — M6281 Muscle weakness (generalized): Secondary | ICD-10-CM | POA: Diagnosis present

## 2022-01-18 NOTE — Therapy (Signed)
?OUTPATIENT PHYSICAL THERAPY TREATMENT NOTE/ERO ? ? ?Patient Name: [name] ?MRN: [id number] ?DOB:December 29, 1955, 66 y.o., male ?Today's Date: 01/18/2022 ? ?PCP: Macarthur Critchley, MD ?REFERRING PROVIDER: Retta Diones* ? ? PT End of Session - 01/18/22 1335   ? ? Visit Number 13   ? Number of Visits 16   ? Date for PT Re-Evaluation 02/10/22   ? Authorization Type BCBS   ? PT Start Time 1332   ? PT Stop Time [id number]   ? PT Time Calculation (min) 45 min   ? Activity Tolerance Patient tolerated treatment well;Patient limited by pain   ? Behavior During Therapy [facility] for tasks assessed/performed   ? ?  ?  ? ?  ? ? ? ? ? ? ? ? ? ? ? ? ? ?Past Medical History:  ?Diagnosis Date  ? COPD (chronic obstructive pulmonary disease) ([place])   ? GERD (gastroesophageal reflux disease)   ? History of hepatitis C   ? treated several years ago through [facility]  ? Hypertension   ? ?Past Surgical History:  ?Procedure Laterality Date  ? CATARACT EXTRACTION, BILATERAL    ? EYE SURGERY Bilateral   ? IR THORACENTESIS ASP PLEURAL SPACE W/IMG GUIDE  09/14/2021  ? IR THORACENTESIS ASP PLEURAL SPACE W/IMG GUIDE  09/21/2021  ? ORIF PELVIC FRACTURE WITH PERCUTANEOUS SCREWS Right 07/29/2021  ? Procedure: SI SCREW FIXATION OF RIGHT PELVIC RING;  Surgeon: Altamese Somerset, MD;  Location: [place];  Service: Orthopedics;  Laterality: Right;  ? ORTHOPEDIC SURGERY Bilateral   ? pt states fracture repairs to arms and legs.  ? REVERSE SHOULDER ARTHROPLASTY Left 09/23/2019  ? Procedure: REVERSE SHOULDER ARTHROPLASTY;  Surgeon: Tania Ade, MD;  Location: [facility];  Service: Orthopedics;  Laterality: Left;  ? TRACHEOSTOMY TUBE PLACEMENT N/A 08/10/2021  ? Procedure: TRACHEOSTOMY;  Surgeon: Georganna Skeans, MD;  Location: [place];  Service: General;  Laterality: N/A;  ? ?Patient Active Problem List  ? Diagnosis Date Noted  ? Acute on chronic respiratory failure with hypoxia ([place]) 09/08/2021  ? Traumatic fracture of ribs of right side with pneumothorax 09/08/2021  ?  History of alcohol abuse 09/08/2021  ? Delirium due to another medical condition 09/08/2021  ? Healthcare associated bacterial pneumonia 09/08/2021  ? Anxiety disorder due to known physiological condition   ? Fall 07/27/2021  ? Screening for prostate cancer 01/27/2021  ? Elevated LFTs 01/27/2021  ? Former smoker 01/27/2021  ? Chronic respiratory failure with hypoxia ([place]) 11/08/2020  ? Obstructive sleep apnea hypopnea, severe 09/24/2020  ? Exudative age-related macular degeneration of left eye with active choroidal neovascularization ([place]) 01/28/2020  ? 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  ? Exudative age-related macular degeneration of right eye with active choroidal neovascularization ([place]) 01/28/2020  ? CAD (coronary artery disease) 10/16/2019  ? S/P reverse total shoulder arthroplasty, left 09/23/2019  ? Overweight (BMI 25.0-29.9) 08/13/2019  ? Hyperlipidemia 08/13/2019  ? Essential hypertension 08/13/2019  ? Gastroesophageal reflux disease 08/13/2019  ? Macular degeneration, wet ([place]) 11/09/2017  ? Lung nodules 10/28/2015  ? COPD mixed type ([place]) 10/01/2014  ? Chest x-ray abnormality 10/01/2014  ? ? ?REFERRING DIAG: pelvic fracture ? ?THERAPY DIAG:  ?Chronic right-sided low back pain with sciatica, sciatica laterality unspecified ? ?Muscle weakness (generalized) ? ?Difficulty in walking, not elsewhere classified ? ?PERTINENT HISTORY: ORIF Pelvic fx with percutaneous screws 07/29/21, reverse TSR Left,2021, tracheostomy 11/22 22, COPD, GERD , Hx of Hep C, HTN See medical history  ? ?SUBJECTIVE:  I return  to work on Monday, May 15 but I will have accomodation of no ladders or platforms for at least the first few weeks.  I continue to walk about 45 min at a time. I rest about 45 min in between.  ? ? ? ?PAIN:  ?Are you having pain? Yes ?NPRS scale:1/10 low back  across  ?Pain location: Rt low back, sacrum, BIL hips knees L.R ?PAIN TYPE: aching and dull ?Pain description: constant  ?Aggravating  factors: prolonged sitting ?Relieving factors: Walking, Tylenol ? ? ? ? ?OBJECTIVE:  ? *Unless otherwise noted, objective data collected previously* ?DIAGNOSTIC FINDINGS:  ?IMPRESSION: ?1. Comminuted bilateral pubic rami. Superior offset of the left ?superior ramus is stable from a recent CT but increased since ?December 2nd. ?2. Stable bilateral sacral cannulated screws. No new osseous ?abnormality identified. ?  ?PATIENT SURVEYS:  ?FOTO 28% predicted 56% ?11-25-21  59% predicted 56% ?  ?SCREENING FOR RED FLAGS: ?Bowel or bladder incontinence: No ?Spinal tumors: No ?Cauda equina syndrome: No no numbness noted ?Compression fracture: No ?Abdominal aneurysm: No ?  ?COGNITION: ?         Overall cognitive status: Within functional limits for tasks assessed              ?          ?SENSATION: ?         Light touch: Appears intact ?         Stereognosis: Appears intact ?         Hot/Cold: Appears intact ?         Proprioception: Appears intact ?  ?MUSCLE LENGTH: ?Hamstrings: Right 55 deg pain; Left 68 deg ?Thomas test: Right 25 deg; Left 20 deg retested 12-16-21  R 20, Left 20 ?  ?POSTURE:  ?Noted bil tremors noted bil UE ?  ?  ?PALPATION: ?Increased pain over bil lumbar paraspinal  TTP over R SI ?  ?LUMBARAROM/PROM ?  ?A/PROM A/PROM  ?10/21/2021 A/PROM ?12-16-21 A/PROM ?01-18-22  ?Flexion Mid shin Able to touch toes bil Able to touch toes but no further  ?Extension 25% 40% 40%  ?Right lateral flexion Finger tip to knee jt line Finger tip to knee jt lin Finger tip to knee jt lin  ?Left lateral flexion 4 inch finger tip above knee jt line 2inch finger tip above knee jt line Finger tip to knee jt line  ?Right rotation 60% 75% 75%  ?Left rotation 60% 75% 75%  ? (Blank rows = not tested) ?  ?LE AROM/PROM: ?  ?A/PROM Right ?10/21/2021 Left ?10/21/2021 RIGHT ?12-16-21 LEFT ?12-16-21  ?Hip flexion 121 118 120 123  ?Hip extension        ?Hip abduction        ?Hip adduction        ?Hip internal rotation        ?Hip external rotation         ?Knee flexion 130 135 132 135  ?Knee extension -6 -3 -6 -3  ?Ankle dorsiflexion        ?Ankle plantarflexion        ?Ankle inversion        ?Ankle eversion        ? (Blank rows = not tested) ?  ?LE MMT: ?NT  with resistance due to pelvic fracture, standing to assess gross strength ?MMT Right ?10/21/2021 Left ?10/21/2021 Right ?11/18/2021 Left ?11/18/2021 Left  ?12-02-21 Right ?12-16-21 Left  ?12-16-21  ?Hip flexion 3-/5 3-/5 5/5 5/5 5/5 4- 5  ?Hip  extension 3-/5 3-/5 3+/5 4/5 4/5 4- 4-  ?Hip abduction 3-/5 3-/5 4/5 3+/5 4-/5 4- 4-  ?Hip adduction           ?Hip internal rotation           ?Hip external rotation           ?Knee flexion           ?Knee extension 3-/5 3-/5 5/5 4+/5 4+/5 4 4+  ?Ankle dorsiflexion           ?Ankle plantarflexion        4 4  ?Ankle inversion           ?Ankle eversion           ? (Blank rows = not tested) ?  ?LUMBAR SPECIAL TESTS:  ?Not tested due to pelvic fracture ?  ?FUNCTIONAL TESTS:  ?30 sec sit to stand 7 in 30 sec, unable to do 5 x STS must use hands ?5 x sts  18.78 sec no use of hands  pain in knees ?12-02-21 5x STS 16.74 sec no use of hands  ?12-16-21 5 x STS 13.72 sec  no use of hands ?12-28-21 5 x STS 10.88sec ? ? ? ?11/04/2021: 6MWT: 672ft, 1.69 ft/sec gait speed ?12-02-21 6MWT   946ft ?12-16-21 6MWT  1201 ft  ? ?TUG - 11.50 sec, dual task 12.95 holding glass of water ?Floor to mat x fer ? ?DGI 23/24 ?  ?GAIT: ?Distance walked: 150 ?Assistive device utilized: Environmental consultant - 2 wheeled ?Level of assistance: Modified independence ?Comments: Pt gait velocity 1.83 ft/sec decreased stride length. Pt walker adjusted for pt height ? 11-25-21 3.07 ft/sec    12-02-21 3.39 ft/sec  12-16-21  3.1ft/sec ?  ?  ?TODAY'S TREATMENT  ?[facility] Adult PT Treatment:                                                DATE: 01-18-22 ?Therapeutic Exercise: modified HEP for DC next visit ? ?Thomas Stretch on tabel 60 sec hold of edge of mat 1 x on R and then on L ?Supine SKTC  sets - 1x 30-60 sec hold ?Supine Lower Trunk Rotation   5  reps - 20 hold ?Supine Bridge   1 x 10 reps with 20 lb weight ?Sit to Stand/goblet squat  20 lb DB 1 x 10 ?Forward Step Up  on 8 inch steps - 1 x 10 reps for r and then L ?Goblet Squat to chair   25 # KB   1 x 10

## 2022-01-20 NOTE — Therapy (Signed)
?OUTPATIENT PHYSICAL THERAPY TREATMENT NOTE/DISCHARGE NOTE ? ? ?Patient Name: [name] ?MRN: [id number] ?DOB:04-Sep-1956, 66 y.o., male ?Today's Date: 01/25/2022 ? ?PCP: Macarthur Critchley, MD ?REFERRING PROVIDER: Macarthur Critchley, MD ?PHYSICAL THERAPY DISCHARGE SUMMARY ? ?Visits from Start of Care: 14 ? ?Current functional level related to goals / functional outcomes: ?As indicated below ?  ?Remaining deficits: ?Tremors in hands and some residual weakness in L LE, some balance deficit with SLS less than 5 sec each.  ?  ?Education / Equipment: ?HEP  ? ?Patient agrees to discharge. Patient goals were  mostly met with 2 partially met limited by breathing and endurance . Patient is being discharged due to meeting the stated rehab goals. Pt is also please with current level of function and would like to return to work with lifting restrictions, also will be on limitation on ladders and platforms.  ? PT End of Session - 01/25/22 1414   ? ? Visit Number 14   ? Number of Visits 16   ? Date for PT Re-Evaluation 02/10/22   ? Authorization Type BCBS   ? PT Start Time 1332   ? PT Stop Time 1416   ? PT Time Calculation (min) 44 min   ? Activity Tolerance Patient tolerated treatment well   ? Behavior During Therapy [facility] for tasks assessed/performed   ? ?  ?  ? ?  ? ? ? ? ? ? ? ? ? ? ? ? ? ? ?Past Medical History:  ?Diagnosis Date  ? COPD (chronic obstructive pulmonary disease) ([place])   ? GERD (gastroesophageal reflux disease)   ? History of hepatitis C   ? treated several years ago through [place]  ? Hypertension   ? ?Past Surgical History:  ?Procedure Laterality Date  ? CATARACT EXTRACTION, BILATERAL    ? EYE SURGERY Bilateral   ? IR THORACENTESIS ASP PLEURAL SPACE W/IMG GUIDE  09/14/2021  ? IR THORACENTESIS ASP PLEURAL SPACE W/IMG GUIDE  09/21/2021  ? ORIF PELVIC FRACTURE WITH PERCUTANEOUS SCREWS Right 07/29/2021  ? Procedure: SI SCREW FIXATION OF RIGHT PELVIC RING;  Surgeon: Altamese Franklin, MD;  Location: [place];  Service:  Orthopedics;  Laterality: Right;  ? ORTHOPEDIC SURGERY Bilateral   ? pt states fracture repairs to arms and legs.  ? REVERSE SHOULDER ARTHROPLASTY Left 09/23/2019  ? Procedure: REVERSE SHOULDER ARTHROPLASTY;  Surgeon: Tania Ade, MD;  Location: [facility];  Service: Orthopedics;  Laterality: Left;  ? TRACHEOSTOMY TUBE PLACEMENT N/A 08/10/2021  ? Procedure: TRACHEOSTOMY;  Surgeon: Georganna Skeans, MD;  Location: [place];  Service: General;  Laterality: N/A;  ? ?Patient Active Problem List  ? Diagnosis Date Noted  ? Acute on chronic respiratory failure with hypoxia ([place]) 09/08/2021  ? Traumatic fracture of ribs of right side with pneumothorax 09/08/2021  ? History of alcohol abuse 09/08/2021  ? Delirium due to another medical condition 09/08/2021  ? Healthcare associated bacterial pneumonia 09/08/2021  ? Anxiety disorder due to known physiological condition   ? Fall 07/27/2021  ? Screening for prostate cancer 01/27/2021  ? Elevated LFTs 01/27/2021  ? Former smoker 01/27/2021  ? Chronic respiratory failure with hypoxia ([place]) 11/08/2020  ? Obstructive sleep apnea hypopnea, severe 09/24/2020  ? Exudative age-related macular degeneration of left eye with active choroidal neovascularization ([place]) 01/28/2020  ? 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  ? Exudative age-related macular degeneration of right eye with active choroidal neovascularization ([place]) 01/28/2020  ? CAD (coronary artery disease) 10/16/2019  ? S/P reverse  total shoulder arthroplasty, left 09/23/2019  ? Overweight (BMI 25.0-29.9) 08/13/2019  ? Hyperlipidemia 08/13/2019  ? Essential hypertension 08/13/2019  ? Gastroesophageal reflux disease 08/13/2019  ? Macular degeneration, wet ([place]) 11/09/2017  ? Lung nodules 10/28/2015  ? COPD mixed type ([place]) 10/01/2014  ? Chest x-ray abnormality 10/01/2014  ? ? ?REFERRING DIAG: pelvic fracture ? ?THERAPY DIAG:  ?Chronic right-sided low back pain with sciatica, sciatica laterality unspecified ? ?Muscle  weakness (generalized) ? ?Difficulty in walking, not elsewhere classified ? ?PERTINENT HISTORY: ORIF Pelvic fx with percutaneous screws 07/29/21, reverse TSR Left,2021, tracheostomy 11/22 22, COPD, GERD , Hx of Hep C, HTN See medical history  ? ?SUBJECTIVE:  I  will return to work on Monday, May 15 but I will have accomodation of no ladders or platforms for at least the first few weeks.  I continue to walk about 45 min at a time. I rest about 45 min in between. Max 2-3 miles.  I had an incident with my R shoulder when I was doing a plank.  My R shoulder has been weak since I fell in 2019.  I was turning over to do plank when I heard and felt a click in my R shoulder. I can move it now but I had to take meds over the weekend.   ? ? ? ?PAIN:  ?Are you having pain? Yes ?NPRS scale:0/10 pain in low back  , R shld has a 1-2/10 with ant deltoid strain from this past weekend trying to perform prone plank ?Pain location: Rt low back, sacrum, BIL hips knees L.R ?PAIN TYPE: aching and dull ?Pain description: constant  ?Aggravating factors: prolonged sitting ?Relieving factors: Walking, Tylenol ? ? ? ? ?OBJECTIVE:  ? *Unless otherwise noted, objective data collected previously* ?DIAGNOSTIC FINDINGS:  ?IMPRESSION: ?1. Comminuted bilateral pubic rami. Superior offset of the left ?superior ramus is stable from a recent CT but increased since ?December 2nd. ?2. Stable bilateral sacral cannulated screws. No new osseous ?abnormality identified. ?  ?PATIENT SURVEYS:  ?FOTO 28% predicted 56% ?11-25-21  59% predicted 56% ?  ?SCREENING FOR RED FLAGS: ?Bowel or bladder incontinence: No ?Spinal tumors: No ?Cauda equina syndrome: No no numbness noted ?Compression fracture: No ?Abdominal aneurysm: No ?  ?COGNITION: ?         Overall cognitive status: Within functional limits for tasks assessed              ?          ?SENSATION: ?         Light touch: Appears intact ?         Stereognosis: Appears intact ?         Hot/Cold: Appears intact ?          Proprioception: Appears intact ?  ?MUSCLE LENGTH: ?Hamstrings: Right 55 deg pain; Left 68 deg ?Thomas test: Right 25 deg; Left 20 deg retested 12-16-21  R 20, Left 20 ?  ?POSTURE:  ?Noted bil tremors noted bil UE ?  ?  ?PALPATION: ?Increased pain over bil lumbar paraspinal  TTP over R SI ?  ?LUMBARAROM/PROM ?  ?A/PROM A/PROM  ?10/21/2021 A/PROM ?12-16-21 A/PROM ?01-18-22  ?Flexion Mid shin Able to touch toes bil Able to touch toes but no further  ?Extension 25% 40% 40%  ?Right lateral flexion Finger tip to knee jt line Finger tip to knee jt lin Finger tip to knee jt lin  ?Left lateral flexion 4 inch finger tip above knee jt line 2inch finger tip  above knee jt line Finger tip to knee jt line  ?Right rotation 60% 75% 75%  ?Left rotation 60% 75% 75%  ? (Blank rows = not tested) ?  ?LE AROM/PROM: ?  ?A/PROM Right ?10/21/2021 Left ?10/21/2021 RIGHT ?12-16-21 LEFT ?12-16-21 RIGHT ?01-26-22 LEFT ?01-26-22  ?Hip flexion 121 118 120 123 128 132  ?Hip extension          ?Hip abduction          ?Hip adduction          ?Hip internal rotation          ?Hip external rotation          ?Knee flexion 130 135 132 135 135 136  ?Knee extension -6 -3 -6 -3 -3 -4  ?Ankle dorsiflexion          ?Ankle plantarflexion          ?Ankle inversion          ?Ankle eversion          ? (Blank rows = not tested) ?  ?LE MMT: ?NT  with resistance due to pelvic fracture, standing to assess gross strength ?MMT Right ?10/21/2021 Left ?10/21/2021 Right ?11/18/2021 Left ?11/18/2021 Left  ?12-02-21 Right ?12-16-21 Left  ?12-16-21 RIGHT 01-26-22 LEFT ?01-26-22  ?Hip flexion 3-/5 3-/5 5/5 5/5 5/5 4- _0 ?Hip extension 3-/5 3-/5 3+/5 4/5 4/5 4- 4- 5 4+  ?Hip abduction 3-/5 3-/5 4/5 3+/5 4-/5 4- 4- 4+ 4  ?Hip adduction             ?Hip internal rotation             ?Hip external rotation             ?Knee flexion             ?Knee extension 3-/5 3-/5 5/5 4+/5 4+/5 4 4+ 5 4+  ?Ankle dorsiflexion             ?Ankle plantarflexion        _1 ?Ankle inversion             ?Ankle  eversion             ? (Blank rows = not tested) ?  ?LUMBAR SPECIAL TESTS:  ?Not tested due to pelvic fracture ?  ?FUNCTIONAL TESTS:  ?30 sec sit to stand 7 in 30 sec, unable to do 5 x STS must use hands ?5 x s

## 2022-01-24 ENCOUNTER — Ambulatory Visit (INDEPENDENT_AMBULATORY_CARE_PROVIDER_SITE_OTHER): Payer: BC Managed Care – PPO | Admitting: Ophthalmology

## 2022-01-24 ENCOUNTER — Encounter (INDEPENDENT_AMBULATORY_CARE_PROVIDER_SITE_OTHER): Payer: Self-pay | Admitting: Ophthalmology

## 2022-01-24 DIAGNOSIS — H353231 Exudative age-related macular degeneration, bilateral, with active choroidal neovascularization: Secondary | ICD-10-CM

## 2022-01-24 DIAGNOSIS — H353221 Exudative age-related macular degeneration, left eye, with active choroidal neovascularization: Secondary | ICD-10-CM | POA: Diagnosis not present

## 2022-01-24 DIAGNOSIS — H353211 Exudative age-related macular degeneration, right eye, with active choroidal neovascularization: Secondary | ICD-10-CM | POA: Diagnosis not present

## 2022-01-24 MED ORDER — AFLIBERCEPT 2MG/0.05ML IZ SOLN FOR KALEIDOSCOPE
2.0000 mg | INTRAVITREAL | Status: AC | PRN
  Administered 2022-01-24: 2 mg via INTRAVITREAL

## 2022-01-24 NOTE — Assessment & Plan Note (Signed)
Currently at 4 weeks post most recent injection for serous retinal detachment worse off of therapy and monitoring and also worse off of CPAP due to industrial injury, and recovery ?

## 2022-01-24 NOTE — Assessment & Plan Note (Signed)
Much improved OD, recent CNVM vastly improved post right reinstitution of therapy with Eylea some 5 weeks previous.  We will repeat injection today and this monocular patient to regain control of wet AMD. ?

## 2022-01-24 NOTE — Progress Notes (Signed)
? ? ?01/24/2022 ? ?  ? ?CHIEF COMPLAINT ?Patient presents for  ?Chief Complaint  ?Patient presents with  ? Macular Degeneration  ? ? ? ? ?HISTORY OF PRESENT ILLNESS: ?[name] is a 66 y.o. male who presents to the clinic today for:  ? ?HPI   ?4 weeks FU OD OCT EYLEA OD. ?Pt stated vision is a little worse in the left. Pt stated, "When I close my left eye, I can see better out of my right eye. No double vision but there is some blurriness." ?Pt denies floaters and FOL. ? ?Last edited by Angeline Slim on 01/24/2022  8:25 AM.  ?  ? ? ?Referring physician: ?Shayne Alken, MD ?[address] ?[place],  [place] [address] ? ?HISTORICAL INFORMATION:  ? ?Selected notes from the MEDICAL RECORD NUMBER ?  ? ?Lab Results  ?Component Value Date  ? HGBA1C 5.4 07/28/2021  ?  ? ?CURRENT MEDICATIONS: ?No current outpatient medications on file. (Ophthalmic Drugs)  ? ?No current facility-administered medications for this visit. (Ophthalmic Drugs)  ? ?Current Outpatient Medications (Other)  ?Medication Sig  ? acetaminophen (TYLENOL) 500 MG tablet Place 2 tablets (1,000 mg total) into feeding tube every 6 (six) hours.  ? aspirin 81 MG chewable tablet Place 1 tablet (81 mg total) into feeding tube daily.  ? bethanechol (URECHOLINE) 25 MG tablet Place 1 tablet (25 mg total) into feeding tube 3 (three) times daily.  ? Chlorhexidine Gluconate Cloth 2 % PADS Apply 6 each topically daily.  ? cholecalciferol (VITAMIN D) 25 MCG tablet Place 2 tablets (2,000 Units total) into feeding tube daily.  ? enoxaparin (LOVENOX) 40 MG/0.4ML injection Inject 0.4 mLs (40 mg total) into the skin every 12 (twelve) hours.  ? folic acid (FOLVITE) 1 MG tablet Place 1 tablet (1 mg total) into feeding tube daily.  ? gabapentin (NEURONTIN) 250 MG/5ML solution Place 6 mLs (300 mg total) into feeding tube every 8 (eight) hours.  ? glycopyrrolate (ROBINUL) 1 MG tablet Place 2 tablets (2 mg total) into feeding tube 3 (three) times daily.  ? guaiFENesin (ROBITUSSIN) 100  MG/5ML liquid Place 10 mLs into feeding tube every 4 (four) hours.  ? haloperidol lactate (HALDOL) 5 MG/ML injection Inject 1 mL (5 mg total) into the vein every 6 (six) hours as needed.  ? hydrocortisone cream 1 % Apply topically 3 (three) times daily.  ? hydrOXYzine (ATARAX) 25 MG tablet Place 1 tablet (25 mg total) into feeding tube 4 (four) times daily as needed for anxiety (Give ~1 hr before vent trial; 1st line for anxiety prior to klonopin.).  ? hydrOXYzine (ATARAX) 25 MG tablet Place 1 tablet (25 mg total) into feeding tube 4 (four) times daily.  ? ibuprofen (ADVIL) 600 MG tablet Place 1 tablet (600 mg total) into feeding tube every 6 (six) hours as needed for moderate pain.  ? insulin aspart (NOVOLOG) 100 UNIT/ML injection Inject 0-15 Units into the skin every 4 (four) hours. (Patient not taking: Reported on 10/21/2021)  ? insulin glargine-yfgn (SEMGLEE) 100 UNIT/ML injection Inject 0.05 mLs (5 Units total) into the skin daily. (Patient not taking: Reported on 10/21/2021)  ? ipratropium-albuterol (DUONEB) 0.5-2.5 (3) MG/3ML SOLN Take 3 mLs by nebulization every 6 (six) hours as needed.  ? lidocaine (LIDODERM) 5 % Place 1 patch onto the skin daily. Remove & Discard patch within 12 hours or as directed by MD  ? melatonin 3 MG TABS tablet Place 1 tablet (3 mg total) into feeding tube daily  at 6 PM. (Patient not taking: Reported on 10/21/2021)  ? methocarbamol 1000 MG TABS Place 1,000 mg into feeding tube every 8 (eight) hours. (Patient not taking: Reported on 10/21/2021)  ? metoprolol tartrate (LOPRESSOR) 25 mg/10 mL SUSP Place 10 mLs (25 mg total) into feeding tube 2 (two) times daily. (Patient not taking: Reported on 10/21/2021)  ? Multiple Vitamin (MULTIVITAMIN WITH MINERALS) TABS tablet Place 1 tablet into feeding tube daily.  ? Nutritional Supplements (FEEDING SUPPLEMENT, JEVITY 1.5 CAL/FIBER,) LIQD Place 1,000 mLs into feeding tube continuous. (Patient not taking: Reported on 10/21/2021)  ? Nutritional Supplements  (FEEDING SUPPLEMENT, PROSOURCE TF,) liquid Place 90 mLs into feeding tube 2 (two) times daily. (Patient not taking: Reported on 10/21/2021)  ? ondansetron (ZOFRAN-ODT) 4 MG disintegrating tablet Take 1 tablet (4 mg total) by mouth every 6 (six) hours as needed for nausea.  ? oxyCODONE (ROXICODONE) 5 MG/5ML solution Place 5-10 mLs (5-10 mg total) into feeding tube every 3 (three) hours as needed for moderate pain or severe pain (5mg  for moderate pain, 10mg  for severe pain). (Patient not taking: Reported on 10/21/2021)  ? pantoprazole sodium (PROTONIX) 40 mg Place 40 mg into feeding tube daily.  ? polyethylene glycol (MIRALAX / GLYCOLAX) 17 g packet Place 17 g into feeding tube daily as needed for mild constipation. (Patient not taking: Reported on 10/21/2021)  ? pravastatin (PRAVACHOL) 40 MG tablet Place 1 tablet (40 mg total) into feeding tube daily.  ? QUEtiapine (SEROQUEL) 100 MG tablet Place 1 tablet (100 mg total) into feeding tube every morning. (Patient not taking: Reported on 10/21/2021)  ? QUEtiapine (SEROQUEL) 200 MG tablet Place 1 tablet (200 mg total) into feeding tube at bedtime. (Patient not taking: Reported on 10/21/2021)  ? silodosin (RAPAFLO) 8 MG CAPS capsule Place 1 capsule (8 mg total) into feeding tube daily with breakfast. (Patient not taking: Reported on 10/21/2021)  ? sodium chloride 0.9 % infusion Inject 250 mLs into the vein continuous. (Patient not taking: Reported on 10/21/2021)  ? thiamine (B-1) 100 MG/ML injection Inject 1 mL (100 mg total) into the vein daily. (Patient not taking: Reported on 10/21/2021)  ? thiamine 100 MG tablet Place 1 tablet (100 mg total) into feeding tube daily. (Patient not taking: Reported on 10/21/2021)  ? traZODone (DESYREL) 50 MG tablet Place 0.5 tablets (25 mg total) into feeding tube at bedtime.  ? Water For Irrigation, Sterile (FREE WATER) SOLN Place 250 mLs into feeding tube every 6 (six) hours.  ? ?No current facility-administered medications for this visit. (Other)   ? ? ? ? ?REVIEW OF SYSTEMS: ?ROS   ?Negative for: Constitutional, Gastrointestinal, Neurological, Skin, Genitourinary, Musculoskeletal, HENT, Endocrine, Cardiovascular, Eyes, Respiratory, Psychiatric, Allergic/Imm, Heme/Lymph ?Last edited by Angeline SlimMa, [place] on 01/24/2022  8:25 AM.  ?  ? ? ? ?ALLERGIES ?No Known Allergies ? ?PAST MEDICAL HISTORY ?Past Medical History:  ?Diagnosis Date  ? COPD (chronic obstructive pulmonary disease) ([facility])   ? GERD (gastroesophageal reflux disease)   ? History of hepatitis C   ? treated several years ago through [facility]  ? Hypertension   ? ?Past Surgical History:  ?Procedure Laterality Date  ? CATARACT EXTRACTION, BILATERAL    ? EYE SURGERY Bilateral   ? IR THORACENTESIS ASP PLEURAL SPACE W/IMG GUIDE  09/14/2021  ? IR THORACENTESIS ASP PLEURAL SPACE W/IMG GUIDE  09/21/2021  ? ORIF PELVIC FRACTURE WITH PERCUTANEOUS SCREWS Right 07/29/2021  ? Procedure: SI SCREW FIXATION OF RIGHT PELVIC RING;  Surgeon: Myrene GalasHandy, Michael, MD;  [facility];  Service: Orthopedics;  Laterality: Right;  ? ORTHOPEDIC SURGERY Bilateral   ? pt states fracture repairs to arms and legs.  ? REVERSE SHOULDER ARTHROPLASTY Left 09/23/2019  ? Procedure: REVERSE SHOULDER ARTHROPLASTY;  Surgeon: Jones Broom, MD;  [facility];  Service: Orthopedics;  Laterality: Left;  ? TRACHEOSTOMY TUBE PLACEMENT N/A 08/10/2021  ? Procedure: TRACHEOSTOMY;  Surgeon: Violeta Gelinas, MD;  Location[facility];  Service: General;  Laterality: N/A;  ? ? ?FAMILY HISTORY ?Family History  ?Problem Relation Age of Onset  ? Heart disease Mother   ? Hypertension Father   ? ? ?SOCIAL HISTORY ?Social History  ? ?Tobacco Use  ? Smoking status: Former  ?  Packs/day: 1.00  ?  Years: 35.00  ?  Pack years: 35.00  ?  Types: Cigarettes  ?  Start date: 32  ?  Quit date: 10/01/2010  ?  Years since quitting: 11.3  ? Smokeless tobacco: Never  ?Vaping Use  ? Vaping Use: Never used  ?Substance Use Topics  ? Alcohol use: Yes  ?  Alcohol/week: 42.0 standard drinks  ?   Types: 42 Cans of beer per week  ?  Comment: daily - 6 drinks a day  ? Drug use: Yes  ?  Types: Marijuana  ?  Comment: last marijuana 2-3 weeks ago  ? ?  ? ?  ? ?OPHTHALMIC EXAM: ? ?Base Eye Exam   ? ? Visual Acu

## 2022-01-25 ENCOUNTER — Ambulatory Visit: Payer: BC Managed Care – PPO | Admitting: Physical Therapy

## 2022-01-25 ENCOUNTER — Encounter: Payer: Self-pay | Admitting: Physical Therapy

## 2022-01-25 DIAGNOSIS — M5440 Lumbago with sciatica, unspecified side: Secondary | ICD-10-CM | POA: Diagnosis not present

## 2022-01-25 DIAGNOSIS — M6281 Muscle weakness (generalized): Secondary | ICD-10-CM

## 2022-01-25 DIAGNOSIS — R262 Difficulty in walking, not elsewhere classified: Secondary | ICD-10-CM

## 2022-01-25 DIAGNOSIS — G8929 Other chronic pain: Secondary | ICD-10-CM

## 2022-01-31 ENCOUNTER — Encounter (INDEPENDENT_AMBULATORY_CARE_PROVIDER_SITE_OTHER): Payer: Self-pay | Admitting: Ophthalmology

## 2022-01-31 ENCOUNTER — Ambulatory Visit (INDEPENDENT_AMBULATORY_CARE_PROVIDER_SITE_OTHER): Payer: BC Managed Care – PPO | Admitting: Ophthalmology

## 2022-01-31 DIAGNOSIS — H35712 Central serous chorioretinopathy, left eye: Secondary | ICD-10-CM

## 2022-01-31 DIAGNOSIS — H353221 Exudative age-related macular degeneration, left eye, with active choroidal neovascularization: Secondary | ICD-10-CM

## 2022-01-31 DIAGNOSIS — H353211 Exudative age-related macular degeneration, right eye, with active choroidal neovascularization: Secondary | ICD-10-CM | POA: Diagnosis not present

## 2022-01-31 DIAGNOSIS — H353231 Exudative age-related macular degeneration, bilateral, with active choroidal neovascularization: Secondary | ICD-10-CM

## 2022-01-31 MED ORDER — AFLIBERCEPT 2MG/0.05ML IZ SOLN FOR KALEIDOSCOPE
2.0000 mg | INTRAVITREAL | Status: AC | PRN
  Administered 2022-01-31: 2 mg via INTRAVITREAL

## 2022-01-31 NOTE — Assessment & Plan Note (Signed)
May be some component of macular disease as patient is on systemic steroid therapy.  Will consider fluorescein angiography in the near future to look for treatable lesions.  Laser treatment to the treatable lesion with navigated micro pulse subthreshold laser may be appropriate as patient has chronic lung disease and may need steroid therapy again in the future ?

## 2022-01-31 NOTE — Assessment & Plan Note (Signed)
OD vastly improved overall post most recent restart of Eylea use. ?

## 2022-01-31 NOTE — Assessment & Plan Note (Signed)
Chronic active subretinal fluid.  We will repeat injection today ?

## 2022-01-31 NOTE — Progress Notes (Signed)
? ? ?01/31/2022 ? ?  ? ?CHIEF COMPLAINT ?Patient presents for  ?Chief Complaint  ?Patient presents with  ? Macular Degeneration  ? ? ? ? ?HISTORY OF PRESENT ILLNESS: ?[name] is a 66 y.o. male who presents to the clinic today for:  ? ?HPI   ?5 weeks dilate OS, Eylea OS, OCT. ?Patient states vision is stable and unchanged since last visit. Denies any new floaters or FOL. ?Patient reports "vision seems a little cloudy this morning." ?Patient has no known allergies. ? ?Still on prednisone therapy for his lung disease.  Ending in 2 days. ? ?We will consider fluorescein angiography left-right in the near future so as to look for treatable lesion since patient has chronic lung disease and may need periodic steroids in the future as well. ?Last edited by Edmon Crapeankin, Dreonna Hussein A, MD on 01/31/2022  9:26 AM.  ?  ? ? ?Referring physician: ?Shayne AlkenBrown, Stephanie Delores, MD ?[address] ?[place],  [place] [id number] ? ?HISTORICAL INFORMATION:  ? ?Selected notes from the MEDICAL RECORD NUMBER ?  ? ?Lab Results  ?Component Value Date  ? HGBA1C 5.4 07/28/2021  ?  ? ?CURRENT MEDICATIONS: ?No current outpatient medications on file. (Ophthalmic Drugs)  ? ?No current facility-administered medications for this visit. (Ophthalmic Drugs)  ? ?Current Outpatient Medications (Other)  ?Medication Sig  ? acetaminophen (TYLENOL) 500 MG tablet Place 2 tablets (1,000 mg total) into feeding tube every 6 (six) hours.  ? aspirin 81 MG chewable tablet Place 1 tablet (81 mg total) into feeding tube daily.  ? bethanechol (URECHOLINE) 25 MG tablet Place 1 tablet (25 mg total) into feeding tube 3 (three) times daily.  ? Chlorhexidine Gluconate Cloth 2 % PADS Apply 6 each topically daily.  ? cholecalciferol (VITAMIN D) 25 MCG tablet Place 2 tablets (2,000 Units total) into feeding tube daily.  ? enoxaparin (LOVENOX) 40 MG/0.4ML injection Inject 0.4 mLs (40 mg total) into the skin every 12 (twelve) hours.  ? folic acid (FOLVITE) 1 MG tablet Place 1 tablet (1 mg total)  into feeding tube daily.  ? gabapentin (NEURONTIN) 250 MG/5ML solution Place 6 mLs (300 mg total) into feeding tube every 8 (eight) hours.  ? glycopyrrolate (ROBINUL) 1 MG tablet Place 2 tablets (2 mg total) into feeding tube 3 (three) times daily.  ? guaiFENesin (ROBITUSSIN) 100 MG/5ML liquid Place 10 mLs into feeding tube every 4 (four) hours.  ? haloperidol lactate (HALDOL) 5 MG/ML injection Inject 1 mL (5 mg total) into the vein every 6 (six) hours as needed.  ? hydrocortisone cream 1 % Apply topically 3 (three) times daily.  ? hydrOXYzine (ATARAX) 25 MG tablet Place 1 tablet (25 mg total) into feeding tube 4 (four) times daily as needed for anxiety (Give ~1 hr before vent trial; 1st line for anxiety prior to klonopin.).  ? hydrOXYzine (ATARAX) 25 MG tablet Place 1 tablet (25 mg total) into feeding tube 4 (four) times daily.  ? ibuprofen (ADVIL) 600 MG tablet Place 1 tablet (600 mg total) into feeding tube every 6 (six) hours as needed for moderate pain.  ? insulin aspart (NOVOLOG) 100 UNIT/ML injection Inject 0-15 Units into the skin every 4 (four) hours. (Patient not taking: Reported on 10/21/2021)  ? insulin glargine-yfgn (SEMGLEE) 100 UNIT/ML injection Inject 0.05 mLs (5 Units total) into the skin daily. (Patient not taking: Reported on 10/21/2021)  ? ipratropium-albuterol (DUONEB) 0.5-2.5 (3) MG/3ML SOLN Take 3 mLs by nebulization every 6 (six) hours as needed.  ? lidocaine (  LIDODERM) 5 % Place 1 patch onto the skin daily. Remove & Discard patch within 12 hours or as directed by MD  ? melatonin 3 MG TABS tablet Place 1 tablet (3 mg total) into feeding tube daily at 6 PM. (Patient not taking: Reported on 10/21/2021)  ? methocarbamol 1000 MG TABS Place 1,000 mg into feeding tube every 8 (eight) hours. (Patient not taking: Reported on 10/21/2021)  ? metoprolol tartrate (LOPRESSOR) 25 mg/10 mL SUSP Place 10 mLs (25 mg total) into feeding tube 2 (two) times daily. (Patient not taking: Reported on 10/21/2021)  ? Multiple  Vitamin (MULTIVITAMIN WITH MINERALS) TABS tablet Place 1 tablet into feeding tube daily.  ? Nutritional Supplements (FEEDING SUPPLEMENT, JEVITY 1.5 CAL/FIBER,) LIQD Place 1,000 mLs into feeding tube continuous. (Patient not taking: Reported on 10/21/2021)  ? Nutritional Supplements (FEEDING SUPPLEMENT, PROSOURCE TF,) liquid Place 90 mLs into feeding tube 2 (two) times daily. (Patient not taking: Reported on 10/21/2021)  ? ondansetron (ZOFRAN-ODT) 4 MG disintegrating tablet Take 1 tablet (4 mg total) by mouth every 6 (six) hours as needed for nausea.  ? oxyCODONE (ROXICODONE) 5 MG/5ML solution Place 5-10 mLs (5-10 mg total) into feeding tube every 3 (three) hours as needed for moderate pain or severe pain (5mg  for moderate pain, 10mg  for severe pain). (Patient not taking: Reported on 10/21/2021)  ? pantoprazole sodium (PROTONIX) 40 mg Place 40 mg into feeding tube daily.  ? polyethylene glycol (MIRALAX / GLYCOLAX) 17 g packet Place 17 g into feeding tube daily as needed for mild constipation. (Patient not taking: Reported on 10/21/2021)  ? pravastatin (PRAVACHOL) 40 MG tablet Place 1 tablet (40 mg total) into feeding tube daily.  ? QUEtiapine (SEROQUEL) 100 MG tablet Place 1 tablet (100 mg total) into feeding tube every morning. (Patient not taking: Reported on 10/21/2021)  ? QUEtiapine (SEROQUEL) 200 MG tablet Place 1 tablet (200 mg total) into feeding tube at bedtime. (Patient not taking: Reported on 10/21/2021)  ? silodosin (RAPAFLO) 8 MG CAPS capsule Place 1 capsule (8 mg total) into feeding tube daily with breakfast. (Patient not taking: Reported on 10/21/2021)  ? sodium chloride 0.9 % infusion Inject 250 mLs into the vein continuous. (Patient not taking: Reported on 10/21/2021)  ? thiamine (B-1) 100 MG/ML injection Inject 1 mL (100 mg total) into the vein daily. (Patient not taking: Reported on 10/21/2021)  ? thiamine 100 MG tablet Place 1 tablet (100 mg total) into feeding tube daily. (Patient not taking: Reported on 10/21/2021)   ? traZODone (DESYREL) 50 MG tablet Place 0.5 tablets (25 mg total) into feeding tube at bedtime.  ? Water For Irrigation, Sterile (FREE WATER) SOLN Place 250 mLs into feeding tube every 6 (six) hours.  ? ?No current facility-administered medications for this visit. (Other)  ? ? ? ? ?REVIEW OF SYSTEMS: ?ROS   ?Negative for: Constitutional, Gastrointestinal, Neurological, Skin, Genitourinary, Musculoskeletal, HENT, Endocrine, Cardiovascular, Eyes, Respiratory, Psychiatric, Allergic/Imm, Heme/Lymph ?Last edited by 12/19/2021, MD on 01/31/2022  9:26 AM.  ?  ? ? ? ?ALLERGIES ?No Known Allergies ? ?PAST MEDICAL HISTORY ?Past Medical History:  ?Diagnosis Date  ? COPD (chronic obstructive pulmonary disease) (HCC)   ? GERD (gastroesophageal reflux disease)   ? History of hepatitis C   ? treated several years ago through [facility]  ? Hypertension   ? ?Past Surgical History:  ?Procedure Laterality Date  ? CATARACT EXTRACTION, BILATERAL    ? EYE SURGERY Bilateral   ? IR THORACENTESIS ASP PLEURAL SPACE W/IMG GUIDE  09/14/2021  ? IR THORACENTESIS ASP PLEURAL SPACE W/IMG GUIDE  09/21/2021  ? ORIF PELVIC FRACTURE WITH PERCUTANEOUS SCREWS Right 07/29/2021  ? Procedure: SI SCREW FIXATION OF RIGHT PELVIC RING;  Surgeon: Myrene Galas, MD;  Location: [facility] OR;  Service: Orthopedics;  Laterality: Right;  ? ORTHOPEDIC SURGERY Bilateral   ? pt states fracture repairs to arms and legs.  ? REVERSE SHOULDER ARTHROPLASTY Left 09/23/2019  ? Procedure: REVERSE SHOULDER ARTHROPLASTY;  Surgeon: Jones Broom, MD;  Location: [facility] ORS;  Service: Orthopedics;  Laterality: Left;  ? TRACHEOSTOMY TUBE PLACEMENT N/A 08/10/2021  ? Procedure: TRACHEOSTOMY;  Surgeon: Violeta Gelinas, MD;  Location[facility];  Service: General;  Laterality: N/A;  ? ? ?FAMILY HISTORY ?Family History  ?Problem Relation Age of Onset  ? Heart disease Mother   ? Hypertension Father   ? ? ?SOCIAL HISTORY ?Social History  ? ?Tobacco Use  ? Smoking status: Former  ?  Packs/day: 1.00  ?   Years: 35.00  ?  Pack years: 35.00  ?  Types: Cigarettes  ?  Start date: 62  ?  Quit date: 10/01/2010  ?  Years since quitting: 11.3  ? Smokeless tobacco: Never  ?Vaping Use  ? Vaping Use: Never used  ?Sub

## 2022-02-02 ENCOUNTER — Ambulatory Visit
Admission: RE | Admit: 2022-02-02 | Discharge: 2022-02-02 | Disposition: A | Discharge status: Home / Self Care | Payer: BC Managed Care – PPO | Source: Ambulatory Visit | Attending: Acute Care | Admitting: Acute Care

## 2022-02-02 ENCOUNTER — Telehealth: Payer: Self-pay | Admitting: Internal Medicine

## 2022-02-02 DIAGNOSIS — Z87891 Personal history of nicotine dependence: Secondary | ICD-10-CM

## 2022-02-03 ENCOUNTER — Other Ambulatory Visit: Payer: BC Managed Care – PPO

## 2022-02-04 ENCOUNTER — Other Ambulatory Visit: Payer: Self-pay | Admitting: Acute Care

## 2022-02-04 DIAGNOSIS — Z87891 Personal history of nicotine dependence: Secondary | ICD-10-CM

## 2022-02-04 DIAGNOSIS — Z122 Encounter for screening for malignant neoplasm of respiratory organs: Secondary | ICD-10-CM

## 2022-02-07 ENCOUNTER — Encounter: Payer: Self-pay | Admitting: Nurse Practitioner

## 2022-02-07 ENCOUNTER — Ambulatory Visit: Payer: BC Managed Care – PPO | Admitting: Nurse Practitioner

## 2022-02-07 VITALS — BP 122/78 | HR 89 | Temp 97.8°F | Ht 73.0 in | Wt 208.0 lb

## 2022-02-07 DIAGNOSIS — I2510 Atherosclerotic heart disease of native coronary artery without angina pectoris: Secondary | ICD-10-CM | POA: Diagnosis not present

## 2022-02-07 DIAGNOSIS — G4733 Obstructive sleep apnea (adult) (pediatric): Secondary | ICD-10-CM

## 2022-02-07 DIAGNOSIS — I2584 Coronary atherosclerosis due to calcified coronary lesion: Secondary | ICD-10-CM

## 2022-02-07 DIAGNOSIS — J449 Chronic obstructive pulmonary disease, unspecified: Secondary | ICD-10-CM | POA: Diagnosis not present

## 2022-02-07 DIAGNOSIS — G4734 Idiopathic sleep related nonobstructive alveolar hypoventilation: Secondary | ICD-10-CM | POA: Diagnosis not present

## 2022-02-07 MED ORDER — BREZTRI AEROSPHERE 160-9-4.8 MCG/ACT IN AERO: 2.0000 | INHALATION_SPRAY | Freq: Two times a day (BID) | RESPIRATORY_TRACT | 0 refills | Status: DC

## 2022-02-07 NOTE — Addendum Note (Signed)
Addended by: Arvilla Market on: 02/07/2022 03:47 PM   Modules accepted: Orders

## 2022-02-07 NOTE — Assessment & Plan Note (Addendum)
Incidental findings on LDCT chest. Previous referral to cardiology placed at last visit; however, pt was in the hospital for extended period. New referral placed today.

## 2022-02-07 NOTE — Assessment & Plan Note (Signed)
Walking oximetry today on room air with sats in the 90's. Continue on 2 lpm At bedtime.
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
## 2022-02-07 NOTE — Patient Instructions (Addendum)
Stop Trelegy. Trial Breztri 2 puffs Twice daily with spacer. Brush tongue and rinse mouth afterwards.  Continue Albuterol inhaler 2 puffs or duoneb 3 mL neb every 6 hours as needed for shortness of breath or wheezing. Notify if symptoms persist despite rescue inhaler/neb use.  Start on your new CPAP machine with new mask, wear every night for a minimum of 4-6 hours. Continue to use 2 lpm supplemental oxygen at night with CPAP  Pulmonary rehab referral   Referral to cardiology placed today.   Follow up in 6 weeks with Dr. Maple Hudson or Florentina Addison Covalt,NP to see how you are doing with your new machine and mask. If symptoms do not improve or worsen, please contact office for sooner follow up or seek emergency care.
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
## 2022-02-07 NOTE — Assessment & Plan Note (Signed)
Advise he increase his compliance for goal >4-6 hours every night. Well controlled with residual AHI 2.9. Continues to receive good benefit.  Does have some significant mask leaks. He has a new machine from [employer] with new mask but has not started on this yet. Advise that he start on new machine. We will follow up in 6 weeks and determine if he needs mask fitting.

## 2022-02-08 ENCOUNTER — Ambulatory Visit (INDEPENDENT_AMBULATORY_CARE_PROVIDER_SITE_OTHER): Payer: BC Managed Care – PPO | Admitting: Ophthalmology

## 2022-02-08 ENCOUNTER — Encounter (INDEPENDENT_AMBULATORY_CARE_PROVIDER_SITE_OTHER): Payer: Self-pay | Admitting: Ophthalmology

## 2022-02-08 DIAGNOSIS — H353221 Exudative age-related macular degeneration, left eye, with active choroidal neovascularization: Secondary | ICD-10-CM

## 2022-02-08 DIAGNOSIS — H35712 Central serous chorioretinopathy, left eye: Secondary | ICD-10-CM | POA: Diagnosis not present

## 2022-02-08 MED ORDER — FLUORESCEIN SODIUM 10 % IV SOLN
500.0000 mg | INTRAVENOUS | Status: AC | PRN
  Administered 2022-02-08: 500 mg via INTRAVENOUS

## 2022-02-08 NOTE — Progress Notes (Unsigned)
Cardiology Office Note:   Date:  02/09/2022  NAME:  [name]    MRN: [id number] DOB:  05-07-1956   PCP:  Alease Medina, MD  Cardiologist:  None  Electrophysiologist:  None   Referring MD: Noemi Chapel, NP   Chief Complaint  Patient presents with   coronary calcifications    History of Present Illness:   [name] is a 66 y.o. male with a hx of COPD/OSA, HTN, HLD who is being seen today for the evaluation of coronary calcifications at the request of Noemi Chapel, NP.  Recent CT lung cancer screening demonstrated mild coronary calcifications.  Reviewed personally.  Denies any history of heart attack or stroke.  He is on Lipitor 10 mg daily.  No recent lipid values.  He does have high blood pressure but this is well controlled.  His EKG demonstrates sinus rhythm with first-degree AV block.  He has COPD which is moderate.  Currently on inhalers.  He does get short of breath with exertion but he attributes this to his COPD.  No chest tightness or chest pressure.  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  He underwent tracheostomy as well as gastrostomy.  He has recovered from this fully.  He is waiting to go back to work.  He did well with this regarding cardiovascular health.  I would say he survived a very difficult stress test.  Blood pressure well controlled.  Denies any symptoms concerning for angina.  30-pack-year history.  Quit 13 years ago.  He reports he drinks occasionally.  No drug use is reported.  Problem List COPD -moderate OSA HTN HLD Mild coronary calcifications  -CT Chest 02/03/2022  Past Medical History: Past Medical History:  Diagnosis Date   COPD (chronic obstructive pulmonary disease) (HCC)    COPD (chronic obstructive pulmonary disease) (HCC)    GERD (gastroesophageal reflux disease)    History of hepatitis C    treated several years ago through [facility]   Hyperlipidemia    Hypertension     OSA (obstructive sleep apnea)     Past Surgical History: Past Surgical History:  Procedure Laterality Date   CATARACT EXTRACTION, BILATERAL     EYE SURGERY Bilateral    IR THORACENTESIS ASP PLEURAL SPACE W/IMG GUIDE  09/14/2021   IR THORACENTESIS ASP PLEURAL SPACE W/IMG GUIDE  09/21/2021   ORIF PELVIC FRACTURE WITH PERCUTANEOUS SCREWS Right 07/29/2021   Procedure: SI SCREW FIXATION OF RIGHT PELVIC RING;  Surgeon: Myrene Galas, MD;  Location: [facility] OR;  Service: Orthopedics;  Laterality: Right;   ORTHOPEDIC SURGERY Bilateral    pt states fracture repairs to arms and legs.   REVERSE SHOULDER ARTHROPLASTY Left 09/23/2019   Procedure: REVERSE SHOULDER ARTHROPLASTY;  Surgeon: Jones Broom, MD;  Location: [facility];  Service: Orthopedics;  Laterality: Left;   TRACHEOSTOMY TUBE PLACEMENT N/A 08/10/2021   Procedure: TRACHEOSTOMY;  Surgeon: Violeta Gelinas, MD;  Location[facility] OR;  Service: General;  Laterality: N/A;    Current Medications: Current Meds  Medication Sig   atorvastatin (LIPITOR) 10 MG tablet Take 10 mg by mouth daily.   Budeson-Glycopyrrol-Formoterol (BREZTRI AEROSPHERE) 160-9-4.8 MCG/ACT AERO Inhale 2 puffs into the lungs in the morning and at bedtime.   cholecalciferol (VITAMIN D) 25 MCG tablet Place 2 tablets (2,000 Units total) into feeding tube daily.   folic acid (FOLVITE) 1 MG tablet Place 1 tablet (1 mg total) into feeding tube daily.  ibuprofen (ADVIL) 600 MG tablet Place 1 tablet (600 mg total) into feeding tube every 6 (six) hours as needed for moderate pain.   METOPROLOL TARTRATE PO Take 25 mg by mouth.   Multiple Vitamin (MULTIVITAMIN WITH MINERALS) TABS tablet Place 1 tablet into feeding tube daily.   VENTOLIN HFA 108 (90 Base) MCG/ACT inhaler Inhale into the lungs.     Allergies:    Patient has no known allergies.   Social History: Social History   Socioeconomic History   Marital status: Single    Spouse name: Not on file   Number of children: 2   Years  of education: Not on file   Highest education level: Not on file  Occupational History   Occupation: Holiday representativeconstruction  Tobacco Use   Smoking status: Former    Packs/day: 1.00    Years: 35.00    Pack years: 35.00    Types: Cigarettes    Start date: 251972    Quit date: 10/01/2010    Years since quitting: 11.3   Smokeless tobacco: Never  Vaping Use   Vaping Use: Never used  Substance and Sexual Activity   Alcohol use: Yes    Alcohol/week: 42.0 standard drinks    Types: 42 Cans of beer per week    Comment: daily - 6 drinks a day   Drug use: Never    Comment: last marijuana 2-3 weeks ago   Sexual activity: Not Currently  Other Topics Concern   Not on file  Social History Narrative   Not on file   Social Determinants of Health   Financial Resource Strain: Not on file  Food Insecurity: Not on file  Transportation Needs: Not on file  Physical Activity: Not on file  Stress: Not on file  Social Connections: Not on file     Family History: The patient's family history includes Heart disease in his mother; Hypertension in his father.  ROS:   All other ROS reviewed and negative. Pertinent positives noted in the HPI.     EKGs/Labs/Other Studies Reviewed:   The following studies were personally reviewed by me today:  EKG:  EKG is ordered today.  The ekg ordered today demonstrates normal sinus rhythm heart rate 77, no acute ischemic changes or evidence of infarction, and was personally reviewed by me.   Recent Labs: 08/04/2021: TSH 10.767 08/31/2021: ALT 17 09/22/2021: BUN 17; Creatinine, Ser 0.59; Hemoglobin 11.2; Magnesium 2.1; Platelets 272; Potassium 3.5; Sodium 137   Recent Lipid Panel    Component Value Date/Time   CHOL 179 01/29/2021 0945   TRIG 131 08/07/2021 0500   HDL 56.30 01/29/2021 0945   CHOLHDL 3 01/29/2021 0945   VLDL 40.4 (H) 01/29/2021 0945   LDLCALC 104 (H) 08/31/2020 1013   LDLCALC 102 (H) 08/09/2019 1551   LDLDIRECT 106.0 01/29/2021 0945    Physical  Exam:   VS:  BP 132/90   Pulse 77   Ht 6\' 1"  (1.854 m)   Wt 210 lb 9.6 oz (95.5 kg)   SpO2 97%   BMI 27.79 kg/m    Wt Readings from Last 3 Encounters:  02/09/22 210 lb 9.6 oz (95.5 kg)  02/07/22 208 lb (94.3 kg)  08/30/21 236 lb 1.8 oz (107.1 kg)    General: Well nourished, well developed, in no acute distress Head: Atraumatic, normal size  Eyes: PEERLA, EOMI  Neck: Supple, no JVD Endocrine: No thryomegaly Cardiac: Normal S1, S2; RRR; no murmurs, rubs, or gallops Lungs: Clear to auscultation bilaterally, no wheezing,  rhonchi or rales  Abd: Soft, nontender, no hepatomegaly  Ext: No edema, pulses 2+ Musculoskeletal: No deformities, BUE and BLE strength normal and equal Skin: Warm and dry, no rashes   Neuro: Alert and oriented to person, place, time, and situation, CNII-XII grossly intact, no focal deficits  Psych: Normal mood and affect   ASSESSMENT:   [name] is a 65 y.o. male who presents for the following: 1. Coronary artery calcification seen on CAT scan   2. Mixed hyperlipidemia     PLAN:   1. Coronary artery calcification seen on CAT scan 2. Mixed hyperlipidemia -Mild coronary calcification seen on chest CT.  No symptoms of angina.  Does have COPD and history of former tobacco abuse but is quit.  EKG demonstrates sinus rhythm with first-degree block and no acute ischemic changes or evidence of infarction.  No murmurs on examination.  Would recommend to continue aspirin 81 mg daily as well as lipid therapy.  On Lipitor 10 mg daily.  We will recheck his lipids today.  He is fasting.  May need further titration.  Goal LDL less than 70.  He will see [place] back yearly.  If he has symptoms that change he will let us know.      Disposition: Return in about 1 year (around 02/10/2023).  Medication Adjustments/Labs and Tests Ordered: Current medicines are reviewed at length with the patient today.  Concerns regarding medicines are outlined above.  Orders Placed This  Encounter  Procedures   Lipid panel   EKG 12-Lead   No orders of the defined types were placed in this encounter.   Patient Instructions  Medication Instructions:  The current medical regimen is effective;  continue present plan and medications.  *If you need a refill on your cardiac medications before your next appointment, please call your pharmacy*   Lab Work: LIPID today   If you have labs (blood work) drawn today and your tests are completely normal, you will receive your results only by: MyChart Message (if you have MyChart) OR A paper copy in the mail If you have any lab test that is abnormal or we need to change your treatment, we will call you to review the results.   Follow-Up: At [facility], you and your health needs are our priority.  As part of our continuing mission to provide you with exceptional heart care, we have created designated Provider Care Teams.  These Care Teams include your primary Cardiologist (physician) and Advanced Practice Providers (APPs -  Physician Assistants and Nurse Practitioners) who all work together to provide you with the care you need, when you need it.  We recommend signing up for the patient portal called "MyChart".  Sign up information is provided on this After Visit Summary.  MyChart is used to connect with patients for Virtual Visits (Telemedicine).  Patients are able to view lab/test results, encounter notes, upcoming appointments, etc.  Non-urgent messages can be sent to your provider as well.   To learn more about what you can do with MyChart, go to [email or web address].    Your next appointment:   12 month(s)  The format for your next appointment:   In Person  Provider:   Lennie Odor, MD or Marjie Skiff, PA-C or Azalee Course, PA-C             Signed, Lenna Gilford. Flora Lipps, MD, [facility]  [facility]  [address], [address] [place], [place] [address] [phone]  02/09/2022 9:45  AM

## 2022-02-08 NOTE — Progress Notes (Signed)
02/08/2022     CHIEF COMPLAINT Patient presents for  Chief Complaint  Patient presents with   Macular Degeneration      HISTORY OF PRESENT ILLNESS: [name] is a 66 y.o. male who presents to the clinic today for:   HPI   1 week dilate OU, Optos FFA L/R, FP, Possible focal laser OS. Patient states vision is stable and unchanged since last visit. Denies any new floaters or FOL.  Last edited by [name] on 02/08/2022 10:56 AM.      Referring physician: Alease Medina, MD [address] [place],  [place] [address]  HISTORICAL INFORMATION:   Selected notes from the MEDICAL RECORD NUMBER    Lab Results  Component Value Date   HGBA1C 5.4 07/28/2021     CURRENT MEDICATIONS: No current outpatient medications on file. (Ophthalmic Drugs)   No current facility-administered medications for this visit. (Ophthalmic Drugs)   Current Outpatient Medications (Other)  Medication Sig   Budeson-Glycopyrrol-Formoterol (BREZTRI AEROSPHERE) 160-9-4.8 MCG/ACT AERO Inhale 2 puffs into the lungs in the morning and at bedtime.   cholecalciferol (VITAMIN D) 25 MCG tablet Place 2 tablets (2,000 Units total) into feeding tube daily.   folic acid (FOLVITE) 1 MG tablet Place 1 tablet (1 mg total) into feeding tube daily.   ibuprofen (ADVIL) 600 MG tablet Place 1 tablet (600 mg total) into feeding tube every 6 (six) hours as needed for moderate pain.   metoprolol tartrate (LOPRESSOR) 25 mg/10 mL SUSP Place 10 mLs (25 mg total) into feeding tube 2 (two) times daily.   Multiple Vitamin (MULTIVITAMIN WITH MINERALS) TABS tablet Place 1 tablet into feeding tube daily.   No current facility-administered medications for this visit. (Other)      REVIEW OF SYSTEMS: ROS   Negative for: Constitutional, Gastrointestinal, Neurological, Skin, Genitourinary, Musculoskeletal, HENT, Endocrine, Cardiovascular, Eyes, Respiratory, Psychiatric, Allergic/Imm, Heme/Lymph Last edited by Edmon Crape, MD  on 02/08/2022 11:29 AM.       ALLERGIES No Known Allergies  PAST MEDICAL HISTORY Past Medical History:  Diagnosis Date   COPD (chronic obstructive pulmonary disease) (HCC)    GERD (gastroesophageal reflux disease)    History of hepatitis C    treated several years ago through [facility]   Hypertension    Past Surgical History:  Procedure Laterality Date   CATARACT EXTRACTION, BILATERAL     EYE SURGERY Bilateral    IR THORACENTESIS ASP PLEURAL SPACE W/IMG GUIDE  09/14/2021   IR THORACENTESIS ASP PLEURAL SPACE W/IMG GUIDE  09/21/2021   ORIF PELVIC FRACTURE WITH PERCUTANEOUS SCREWS Right 07/29/2021   Procedure: SI SCREW FIXATION OF RIGHT PELVIC RING;  Surgeon: Charles Galas, MD;  Location: [facility] OR;  Service: Orthopedics;  Laterality: Right;   ORTHOPEDIC SURGERY Bilateral    pt states fracture repairs to arms and legs.   REVERSE SHOULDER ARTHROPLASTY Left 09/23/2019   Procedure: REVERSE SHOULDER ARTHROPLASTY;  Surgeon: Charles Broom, MD;  Location: [facility];  Service: Orthopedics;  Laterality: Left;   TRACHEOSTOMY TUBE PLACEMENT N/A 08/10/2021   Procedure: TRACHEOSTOMY;  Surgeon: Charles Gelinas, MD;  Location: [facility] OR;  Service: General;  Laterality: N/A;    FAMILY HISTORY Family History  Problem Relation Age of Onset   Heart disease Mother    Hypertension Father     SOCIAL HISTORY Social History   Tobacco Use   Smoking status: Former    Packs/day: 1.00    Years: 35.00    Pack years: 35.00  Types: Cigarettes    Start date: 321972    Quit date: 10/01/2010    Years since quitting: 11.3   Smokeless tobacco: Never  Vaping Use   Vaping Use: Never used  Substance Use Topics   Alcohol use: Yes    Alcohol/week: 42.0 standard drinks    Types: 42 Cans of beer per week    Comment: daily - 6 drinks a day   Drug use: Yes    Types: Marijuana    Comment: last marijuana 2-3 weeks ago         OPHTHALMIC EXAM:  Base Eye Exam     Visual Acuity (ETDRS)       Right Left   Dist [place]  20/30 -1 20/160   Dist ph [place] NI 20/80         Tonometry (Tonopen, 10:59 AM)       Right Left   Pressure 9 9         Pupils       Pupils APD   Right PERRL None   Left PERRL None         Extraocular Movement       Right Left    Full Full         Neuro/Psych     Oriented x3: Yes   Mood/Affect: Normal         Dilation     Both eyes: 1.0% Mydriacyl, 2.5% Phenylephrine @ 10:59 AM           Slit Lamp and Fundus Exam     External Exam       Right Left   External Normal Normal         Slit Lamp Exam       Right Left   Lids/Lashes Normal Normal   Conjunctiva/Sclera White and quiet White and quiet   Cornea Clear Clear   Anterior Chamber Deep and quiet Deep and quiet   Iris Round and reactive Round and reactive   Lens Posterior chamber intraocular lens Posterior chamber intraocular lens   Anterior Vitreous Normal Normal         Fundus Exam       Right Left   Posterior Vitreous Vitrectomized Normal   Disc Normal Normal   C/D Ratio 0.3 0.3   Macula Retinal pigment epithelial mottling, no disciform scar, no macular thickening, no hemorrhage Macular thickening, Retinal pigment epithelial mottling, Retinal pigment epithelial detachment, Epiretinal membrane, old serous rd inferior macula   Vessels Normal Normal   Periphery Normal, Good laser retinopexy temporally and inferonasal from prior retinal detachment repair Normal            IMAGING AND PROCEDURES  Imaging and Procedures for 02/08/22  Fluorescein Angiography Optos (Transit OS)       Injection: 500 mg Fluorescein Sodium 10 %   Route: Intravenous   NDC: [id number]   Left Eye   Early phase findings include leakage, window defect, staining. Choroidal neovascularization is occult.   Notes OS with the occult CNVM like features of CNVM diffusely within the RPE but a focal leaking spot is noted inferonasal to the FAZ that could be multifocal punctate CS CR.  This is the area we  will address with focal laser treatment to release him to diminish the serous component of his ongoing wet AMD OS  OD diffuse RPE abnormalities.  In the macular region.  No focal leakage is     Focal Laser - OS - Left Eye  Time Out Confirmed correct patient, procedure, site, and patient consented.   Anesthesia Topical anesthesia was used. Anesthetic medications included Proparacaine 0.5%.   Laser Information The type of laser was diode. Color was yellow. The duration in seconds was 0.08. The spot size was 100 microns. Laser power was 80. Total spots was 50.   Post-op The patient tolerated the procedure well. There were no complications. The patient received written and verbal post procedure care education.   Notes Navigated laser, guided subthreshold micro pulse yellow laser applied to leaking spot central nasal and inferior to the FAZ region.  They guided.  10% duty cycle.     Color Fundus Photography Optos - OU - Both Eyes       Right Eye Progression has been stable. Disc findings include normal observations. Macula : retinal pigment epithelium abnormalities. Vessels : normal observations. Periphery : normal observations.   Left Eye Progression has improved. Disc findings include normal observations. Macula : retinal pigment epithelium abnormalities, epiretinal membrane. Vessels : normal observations. Periphery : normal observations.   Notes OD,    OS chronic subretinal fluid             ASSESSMENT/PLAN:  Central serous chorioretinopathy of left eye Recurrent component of wet AMD.  Focal leakage is defined today on the FFA as inferonasal and inferior to FAZ.  These regions were treated with confluent micro pulse subthreshold yellow laser     ICD-10-CM   1. Exudative age-related macular degeneration of left eye with active choroidal neovascularization (HCC)  V25.3664 Fluorescein Angiography Optos (Transit OS)    Focal Laser - OS - Left Eye    Color  Fundus Photography Optos - OU - Both Eyes    Fluorescein Sodium 10 % injection 500 mg    2. Central serous chorioretinopathy of left eye  H35.712       1.  OS, with focal leaking spots of CS CR from sick RPE inferior to FAZ.  In addition to CNVM.  Focal laser applied to the serous component today  With micro pulsed subthreshold laser  2.  Follow-up as scheduled OS for possible injection into vegF again  3.  Follow-up in dilate OD as scheduled  Ophthalmic Meds Ordered this visit:  Meds ordered this encounter  Medications   Fluorescein Sodium 10 % injection 500 mg       Return for As scheduled, June each eye individually as scheduled.  There are no Patient Instructions on file for this visit.   Explained the diagnoses, plan, and follow up with the patient and they expressed understanding.  Patient expressed understanding of the importance of proper follow up care.   Alford Highland Schneider Warchol M.D. Diseases & Surgery of the Retina and Vitreous [facility] 02/08/22     Abbreviations: M myopia (nearsighted); A astigmatism; H hyperopia (farsighted); P presbyopia; Mrx spectacle prescription;  CTL contact lenses; OD right eye; OS left eye; OU both eyes  XT exotropia; ET esotropia; PEK punctate epithelial keratitis; PEE punctate epithelial erosions; DES dry eye syndrome; MGD meibomian gland dysfunction; ATs artificial tears; PFAT's preservative free artificial tears; NSC nuclear sclerotic cataract; PSC posterior subcapsular cataract; ERM epi-retinal membrane; PVD posterior vitreous detachment; RD retinal detachment; DM diabetes mellitus; DR diabetic retinopathy; NPDR non-proliferative diabetic retinopathy; PDR proliferative diabetic retinopathy; CSME clinically significant macular edema; DME diabetic macular edema; dbh dot blot hemorrhages; CWS cotton wool spot; POAG primary open angle glaucoma; C/D cup-to-disc ratio; HVF humphrey visual field; GVF goldmann visual  field; OCT  optical coherence tomography; IOP intraocular pressure; BRVO Branch retinal vein occlusion; CRVO central retinal vein occlusion; CRAO central retinal artery occlusion; BRAO branch retinal artery occlusion; RT retinal tear; SB scleral buckle; PPV pars plana vitrectomy; VH Vitreous hemorrhage; PRP panretinal laser photocoagulation; IVK intravitreal kenalog; VMT vitreomacular traction; MH Macular hole;  NVD neovascularization of the disc; NVE neovascularization elsewhere; AREDS age related eye disease study; ARMD age related macular degeneration; POAG primary open angle glaucoma; EBMD epithelial/anterior basement membrane dystrophy; ACIOL anterior chamber intraocular lens; IOL intraocular lens; PCIOL posterior chamber intraocular lens; Phaco/IOL phacoemulsification with intraocular lens placement; PRK photorefractive keratectomy; LASIK laser assisted in situ keratomileusis; HTN hypertension; DM diabetes mellitus; COPD chronic obstructive pulmonary disease

## 2022-02-08 NOTE — Assessment & Plan Note (Signed)
Recurrent component of wet AMD.  Focal leakage is defined today on the FFA as inferonasal and inferior to FAZ.  These regions were treated with confluent micro pulse subthreshold yellow laser

## 2022-02-09 ENCOUNTER — Ambulatory Visit (INDEPENDENT_AMBULATORY_CARE_PROVIDER_SITE_OTHER): Payer: BC Managed Care – PPO | Admitting: Cardiovascular Disease

## 2022-02-09 ENCOUNTER — Encounter: Payer: Self-pay | Admitting: Cardiovascular Disease

## 2022-02-09 VITALS — BP 132/90 | HR 77 | Ht 73.0 in | Wt 210.6 lb

## 2022-02-09 DIAGNOSIS — I2510 Atherosclerotic heart disease of native coronary artery without angina pectoris: Secondary | ICD-10-CM

## 2022-02-09 DIAGNOSIS — E782 Mixed hyperlipidemia: Secondary | ICD-10-CM

## 2022-02-09 LAB — LIPID PANEL
Chol/HDL Ratio: 3 ratio (ref 0.0–5.0)
Cholesterol, Total: 185 mg/dL (ref 100–199)
HDL: 61 mg/dL (ref >39)
LDL Chol Calc (NIH): 85 mg/dL (ref 0–99)
Triglycerides: 235 mg/dL — ABNORMAL HIGH (ref 0–149)
VLDL Cholesterol Cal: 39 mg/dL (ref 5–40)

## 2022-02-09 NOTE — Patient Instructions (Signed)
Medication Instructions:  The current medical regimen is effective;  continue present plan and medications.  *If you need a refill on your cardiac medications before your next appointment, please call your pharmacy*   Lab Work: LIPID today   If you have labs (blood work) drawn today and your tests are completely normal, you will receive your results only by: MyChart Message (if you have MyChart) OR A paper copy in the mail If you have any lab test that is abnormal or we need to change your treatment, we will call you to review the results.   Follow-Up: At [facility], you and your health needs are our priority.  As part of our continuing mission to provide you with exceptional heart care, we have created designated Provider Care Teams.  These Care Teams include your primary Cardiologist (physician) and Advanced Practice Providers (APPs -  Physician Assistants and Nurse Practitioners) who all work together to provide you with the care you need, when you need it.  We recommend signing up for the patient portal called "MyChart".  Sign up information is provided on this After Visit Summary.  MyChart is used to connect with patients for Virtual Visits (Telemedicine).  Patients are able to view lab/test results, encounter notes, upcoming appointments, etc.  Non-urgent messages can be sent to your provider as well.   To learn more about what you can do with MyChart, go to [email or web address].    Your next appointment:   12 month(s)  The format for your next appointment:   In Person  Provider:   Lennie Odor, MD or Marjie Skiff, PA-C or Azalee Course, [place]

## 2022-02-10 ENCOUNTER — Other Ambulatory Visit: Payer: Self-pay

## 2022-02-10 ENCOUNTER — Telehealth (HOSPITAL_COMMUNITY): Payer: Self-pay

## 2022-02-10 MED ORDER — ATORVASTATIN CALCIUM 40 MG PO TABS: 40.0000 mg | ORAL_TABLET | Freq: Every day | ORAL | 1 refills | Status: DC

## 2022-02-10 NOTE — Telephone Encounter (Signed)
Called and spoke with pt in regards to PR, pt stated he is unable to participate at this time due to his work schedule.   Closed referral

## 2022-02-28 ENCOUNTER — Ambulatory Visit (INDEPENDENT_AMBULATORY_CARE_PROVIDER_SITE_OTHER): Payer: BC Managed Care – PPO | Admitting: Ophthalmology

## 2022-02-28 ENCOUNTER — Encounter (INDEPENDENT_AMBULATORY_CARE_PROVIDER_SITE_OTHER): Payer: Self-pay | Admitting: Ophthalmology

## 2022-02-28 DIAGNOSIS — H353221 Exudative age-related macular degeneration, left eye, with active choroidal neovascularization: Secondary | ICD-10-CM

## 2022-02-28 DIAGNOSIS — G4733 Obstructive sleep apnea (adult) (pediatric): Secondary | ICD-10-CM | POA: Diagnosis not present

## 2022-02-28 DIAGNOSIS — H353211 Exudative age-related macular degeneration, right eye, with active choroidal neovascularization: Secondary | ICD-10-CM

## 2022-02-28 MED ORDER — AFLIBERCEPT 2MG/0.05ML IZ SOLN FOR KALEIDOSCOPE
2.0000 mg | INTRAVITREAL | Status: AC | PRN
  Administered 2022-02-28: 2 mg via INTRAVITREAL

## 2022-02-28 NOTE — Assessment & Plan Note (Signed)
Patient successfully able to return to use of CPAP

## 2022-02-28 NOTE — Progress Notes (Signed)
02/28/2022     CHIEF COMPLAINT Patient presents for  Chief Complaint  Patient presents with   Macular Degeneration      HISTORY OF PRESENT ILLNESS: [name] is a 66 y.o. male who presents to the clinic today for:   HPI   5 weeks dilate OD, Eylea OCT. Patient states vision is stable and unchanged since last visit. Denies any new floaters or FOL.  Last edited by Laurin Coder on 02/28/2022  8:10 AM.      Referring physician: Macarthur Critchley, MD [place],  [place] [address]  HISTORICAL INFORMATION:   Selected notes from the MEDICAL RECORD NUMBER    Lab Results  Component Value Date   HGBA1C 5.4 07/28/2021     CURRENT MEDICATIONS: No current outpatient medications on file. (Ophthalmic Drugs)   No current facility-administered medications for this visit. (Ophthalmic Drugs)   Current Outpatient Medications (Other)  Medication Sig   atorvastatin (LIPITOR) 40 MG tablet Take 1 tablet (40 mg total) by mouth daily.   Budeson-Glycopyrrol-Formoterol (BREZTRI AEROSPHERE) 160-9-4.8 MCG/ACT AERO Inhale 2 puffs into the lungs in the morning and at bedtime.   cholecalciferol (VITAMIN D) 25 MCG tablet Place 2 tablets (2,000 Units total) into feeding tube daily.   folic acid (FOLVITE) 1 MG tablet Place 1 tablet (1 mg total) into feeding tube daily.   ibuprofen (ADVIL) 600 MG tablet Place 1 tablet (600 mg total) into feeding tube every 6 (six) hours as needed for moderate pain.   METOPROLOL TARTRATE PO Take 25 mg by mouth.   Multiple Vitamin (MULTIVITAMIN WITH MINERALS) TABS tablet Place 1 tablet into feeding tube daily.   VENTOLIN HFA 108 (90 Base) MCG/ACT inhaler Inhale into the lungs.   No current facility-administered medications for this visit. (Other)      REVIEW OF SYSTEMS: ROS   Negative for: Constitutional, Gastrointestinal, Neurological, Skin, Genitourinary, Musculoskeletal, HENT, Endocrine, Cardiovascular, Eyes, Respiratory, Psychiatric,  Allergic/Imm, Heme/Lymph Last edited by Hurman Horn, MD on 02/28/2022  8:49 AM.       ALLERGIES No Known Allergies  PAST MEDICAL HISTORY Past Medical History:  Diagnosis Date   COPD (chronic obstructive pulmonary disease) ([place])    COPD (chronic obstructive pulmonary disease) ([place])    GERD (gastroesophageal reflux disease)    History of hepatitis C    treated several years ago through [facility]   Hyperlipidemia    Hypertension    OSA (obstructive sleep apnea)    Past Surgical History:  Procedure Laterality Date   CATARACT EXTRACTION, BILATERAL     EYE SURGERY Bilateral    IR THORACENTESIS ASP PLEURAL SPACE W/IMG GUIDE  09/14/2021   IR THORACENTESIS ASP PLEURAL SPACE W/IMG GUIDE  09/21/2021   ORIF PELVIC FRACTURE WITH PERCUTANEOUS SCREWS Right 07/29/2021   Procedure: SI SCREW FIXATION OF RIGHT PELVIC RING;  Surgeon: Altamese Mound, MD;  Location: [facility];  Service: Orthopedics;  Laterality: Right;   ORTHOPEDIC SURGERY Bilateral    pt states fracture repairs to arms and legs.   REVERSE SHOULDER ARTHROPLASTY Left 09/23/2019   Procedure: REVERSE SHOULDER ARTHROPLASTY;  Surgeon: Tania Ade, MD;  Location: [facility];  Service: Orthopedics;  Laterality: Left;   TRACHEOSTOMY TUBE PLACEMENT N/A 08/10/2021   Procedure: TRACHEOSTOMY;  Surgeon: Georganna Skeans, MD;  Location: [facility] OR;  Service: General;  Laterality: N/A;    FAMILY HISTORY Family History  Problem Relation Age of Onset   Heart disease Mother    Hypertension Father  SOCIAL HISTORY Social History   Tobacco Use   Smoking status: Former    Packs/day: 1.00    Years: 35.00    Total pack years: 35.00    Types: Cigarettes    Start date: 67    Quit date: 10/01/2010    Years since quitting: 11.4   Smokeless tobacco: Never  Vaping Use   Vaping Use: Never used  Substance Use Topics   Alcohol use: Yes    Alcohol/week: 42.0 standard drinks of alcohol    Types: 42 Cans of beer per week    Comment: daily - 6 drinks a  day   Drug use: Never    Comment: last marijuana 2-3 weeks ago         OPHTHALMIC EXAM:  Base Eye Exam     Visual Acuity (ETDRS)       Right Left   Dist [place] 20/40 -2 20/160 +1   Dist ph [place] NI 20/100         Tonometry (Tonopen, 8:13 AM)       Right Left   Pressure 12 10         Pupils       Pupils APD   Right PERRL None   Left PERRL None         Extraocular Movement       Right Left    Full Full         Neuro/Psych     Oriented x3: Yes   Mood/Affect: Normal         Dilation     Right eye: 1.0% Mydriacyl, 2.5% Phenylephrine @ 8:13 AM           Slit Lamp and Fundus Exam     External Exam       Right Left   External Normal Normal         Slit Lamp Exam       Right Left   Lids/Lashes Normal Normal   Conjunctiva/Sclera White and quiet White and quiet   Cornea Clear Clear   Anterior Chamber Deep and quiet Deep and quiet   Iris Round and reactive Round and reactive   Lens Posterior chamber intraocular lens Posterior chamber intraocular lens   Anterior Vitreous Normal Normal         Fundus Exam       Right Left   Posterior Vitreous Vitrectomized    Disc Normal    C/D Ratio 0.3    Macula Retinal pigment epithelial mottling, no disciform scar, no macular thickening, no hemorrhage    Vessels Normal    Periphery Normal, Good laser retinopexy temporally and inferonasal from prior retinal detachment repair             IMAGING AND PROCEDURES  Imaging and Procedures for 02/28/22  Intravitreal Injection, Pharmacologic Agent - OD - Right Eye       Time Out 02/28/2022. 8:51 AM. Confirmed correct patient, procedure, site, and patient consented.   Anesthesia Topical anesthesia was used. Anesthetic medications included Lidocaine 4%.   Procedure Preparation included 5% betadine to ocular surface, 10% betadine to eyelids, Ofloxacin . A 30 gauge needle was used.   Injection: 2 mg aflibercept 2 MG/0.05ML   Route: Intravitreal,  Site: Right Eye   NDC: [id number], Lot: [id number], Waste: 0 mL   Post-op Post injection exam found visual acuity of at least counting fingers. The patient tolerated the procedure well. There were no complications. The patient received written and verbal  post procedure care education. Post injection medications were not given.      OCT, Retina - OU - Both Eyes       Right Eye Quality was good. Scan locations included subfoveal. Central Foveal Thickness: 259. Progression has improved. Findings include abnormal foveal contour.   Left Eye Quality was good. Scan locations included subfoveal. Central Foveal Thickness: 365. Progression has improved. Findings include abnormal foveal contour, epiretinal membrane, subretinal fluid.   Notes OD previously, April 2023, with region of subretinal hyper reflective material and a subtle amount of subretinal fluid superonasal to the FAZ OD.  Vastly improved OD   OS 4 weeks post most recent injection Eylea.  Only improved and still active subretinal fluid.              ASSESSMENT/PLAN:  Exudative age-related macular degeneration of left eye with active choroidal neovascularization (HCC) OS improving subretinal fluid by OCT examination today at 4 weeks post most recent Eylea.  Follow-up next as scheduled  Exudative age-related macular degeneration of right eye with active choroidal neovascularization ([place]) OD, also improved much less subretinal fluid post reinstitution of Eylea.  Today at 5-week interval.  Obstructive sleep apnea hypopnea, severe Patient successfully able to return to use of CPAP     ICD-10-CM   1. Exudative age-related macular degeneration of right eye with active choroidal neovascularization (HCC)  H35.3211 Intravitreal Injection, Pharmacologic Agent - OD - Right Eye    OCT, Retina - OU - Both Eyes    aflibercept (EYLEA) SOLN 2 mg    2. Exudative age-related macular degeneration of left eye with active choroidal  neovascularization ([place])  H35.3221     3. Obstructive sleep apnea hypopnea, severe  G47.33       1.  OD vastly improved post reinstitution of therapy with Eylea.  Preserved acuity.  Repeat Eylea today.  Follow-up next in 5 weeks likely Eylea injection OD  2.  OS also improved post Eylea at 4 weeks, follow-up next as scheduled  3.  Ophthalmic Meds Ordered this visit:  Meds ordered this encounter  Medications   aflibercept (EYLEA) SOLN 2 mg       Return in about 5 weeks (around 04/04/2022) for dilate, OD, EYLEA OCT, and follow-up OS as scheduled.  There are no Patient Instructions on file for this visit.   Explained the diagnoses, plan, and follow up with the patient and they expressed understanding.  Patient expressed understanding of the importance of proper follow up care.   Clent Demark Emeri Estill M.D. Diseases & Surgery of the Retina and Vitreous Retina [facility] Diabetic [place] 02/28/22     Abbreviations: M myopia (nearsighted); A astigmatism; H hyperopia (farsighted); P presbyopia; Mrx spectacle prescription;  CTL contact lenses; OD right eye; OS left eye; OU both eyes  XT exotropia; ET esotropia; PEK punctate epithelial keratitis; PEE punctate epithelial erosions; DES dry eye syndrome; MGD meibomian gland dysfunction; ATs artificial tears; PFAT's preservative free artificial tears; [place] nuclear sclerotic cataract; PSC posterior subcapsular cataract; ERM epi-retinal membrane; PVD posterior vitreous detachment; RD retinal detachment; DM diabetes mellitus; DR diabetic retinopathy; NPDR non-proliferative diabetic retinopathy; PDR proliferative diabetic retinopathy; CSME clinically significant macular edema; DME diabetic macular edema; dbh dot blot hemorrhages; CWS cotton wool spot; POAG primary open angle glaucoma; C/D cup-to-disc ratio; HVF humphrey visual field; GVF goldmann visual field; OCT optical coherence tomography; IOP intraocular pressure; BRVO Branch retinal vein occlusion; CRVO  central retinal vein occlusion; CRAO central retinal artery occlusion; BRAO branch retinal artery  occlusion; RT retinal tear; SB scleral buckle; PPV pars plana vitrectomy; VH Vitreous hemorrhage; PRP panretinal laser photocoagulation; IVK intravitreal kenalog; VMT vitreomacular traction; MH Macular hole;  NVD neovascularization of the disc; NVE neovascularization elsewhere; AREDS age related eye disease study; ARMD age related macular degeneration; POAG primary open angle glaucoma; EBMD epithelial/anterior basement membrane dystrophy; ACIOL anterior chamber intraocular lens; IOL intraocular lens; PCIOL posterior chamber intraocular lens; Phaco/IOL phacoemulsification with intraocular lens placement; [place] photorefractive keratectomy; LASIK laser assisted in situ keratomileusis; HTN hypertension; DM diabetes mellitus; COPD chronic obstructive pulmonary disease

## 2022-02-28 NOTE — Assessment & Plan Note (Signed)
OS improving subretinal fluid by OCT examination today at 4 weeks post most recent Eylea.  Follow-up next as scheduled

## 2022-02-28 NOTE — Assessment & Plan Note (Addendum)
OD, also improved much less subretinal fluid post reinstitution of Eylea.  Today at 5-week interval.

## 2022-03-14 ENCOUNTER — Ambulatory Visit (INDEPENDENT_AMBULATORY_CARE_PROVIDER_SITE_OTHER): Payer: BC Managed Care – PPO | Admitting: Ophthalmology

## 2022-03-14 ENCOUNTER — Encounter (INDEPENDENT_AMBULATORY_CARE_PROVIDER_SITE_OTHER): Payer: Self-pay | Admitting: Ophthalmology

## 2022-03-14 DIAGNOSIS — H353211 Exudative age-related macular degeneration, right eye, with active choroidal neovascularization: Secondary | ICD-10-CM | POA: Diagnosis not present

## 2022-03-14 DIAGNOSIS — G4733 Obstructive sleep apnea (adult) (pediatric): Secondary | ICD-10-CM

## 2022-03-14 DIAGNOSIS — H02831 Dermatochalasis of right upper eyelid: Secondary | ICD-10-CM

## 2022-03-14 DIAGNOSIS — H02834 Dermatochalasis of left upper eyelid: Secondary | ICD-10-CM

## 2022-03-14 DIAGNOSIS — H353221 Exudative age-related macular degeneration, left eye, with active choroidal neovascularization: Secondary | ICD-10-CM

## 2022-03-14 MED ORDER — AFLIBERCEPT 2MG/0.05ML IZ SOLN FOR KALEIDOSCOPE
2.0000 mg | INTRAVITREAL | Status: AC | PRN
  Administered 2022-03-14: 2 mg via INTRAVITREAL

## 2022-03-14 NOTE — Assessment & Plan Note (Signed)
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  We will continue to treat today at 6-week interval.  Is less subretinal fluid present

## 2022-03-14 NOTE — Assessment & Plan Note (Signed)
Doing well 2 weeks after most recent injection OD, follow-up as scheduled

## 2022-03-16 NOTE — Telephone Encounter (Signed)
Seems like encounter was open in error so closing encounter.  

## 2022-03-18 ENCOUNTER — Ambulatory Visit: Payer: BC Managed Care – PPO | Admitting: Internal Medicine

## 2022-03-21 ENCOUNTER — Ambulatory Visit: Payer: BC Managed Care – PPO | Admitting: Internal Medicine
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
## 2022-03-28 ENCOUNTER — Ambulatory Visit (INDEPENDENT_AMBULATORY_CARE_PROVIDER_SITE_OTHER): Payer: BC Managed Care – PPO | Admitting: Internal Medicine

## 2022-03-28 ENCOUNTER — Encounter: Payer: Self-pay | Admitting: Internal Medicine

## 2022-03-28 DIAGNOSIS — G4733 Obstructive sleep apnea (adult) (pediatric): Secondary | ICD-10-CM

## 2022-03-28 DIAGNOSIS — J9611 Chronic respiratory failure with hypoxia: Secondary | ICD-10-CM | POA: Diagnosis not present

## 2022-03-28 DIAGNOSIS — R918 Other nonspecific abnormal finding of lung field: Secondary | ICD-10-CM

## 2022-03-28 MED ORDER — ALBUTEROL SULFATE (2.5 MG/3ML) 0.083% IN NEBU: 2.5000 mg | INHALATION_SOLUTION | Freq: Four times a day (QID) | RESPIRATORY_TRACT | 12 refills | Status: DC | PRN

## 2022-03-28 MED ORDER — PREDNISONE 10 MG PO TABS: ORAL_TABLET | ORAL | 0 refills | Status: DC

## 2022-03-28 NOTE — Patient Instructions (Addendum)
We will contact [facility] for your settings, since they sent you a new machine and are sending you supplies.  Script sent for prednisone taper to try to help your breathing.  Script sent refilling your nebulizer solution.  Please call if we can help

## 2022-03-29 ENCOUNTER — Encounter: Payer: Self-pay | Admitting: Internal Medicine
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
## 2022-03-29 NOTE — Assessment & Plan Note (Signed)
LDCT chest 02/03/2022 negative for concerning nodules. Plan-repeat in 12 months

## 2022-03-29 NOTE — Assessment & Plan Note (Signed)
He had been using a friend's machine.  Now using new replacement from [facility] but on check at this visit, [facility] does not show a record of him on CPAP.  We will try to clarify that.

## 2022-04-05 ENCOUNTER — Ambulatory Visit (INDEPENDENT_AMBULATORY_CARE_PROVIDER_SITE_OTHER): Payer: BC Managed Care – PPO | Admitting: Ophthalmology

## 2022-04-05 ENCOUNTER — Encounter (INDEPENDENT_AMBULATORY_CARE_PROVIDER_SITE_OTHER): Payer: Self-pay | Admitting: Ophthalmology

## 2022-04-05 DIAGNOSIS — H353211 Exudative age-related macular degeneration, right eye, with active choroidal neovascularization: Secondary | ICD-10-CM

## 2022-04-05 DIAGNOSIS — G4734 Idiopathic sleep related nonobstructive alveolar hypoventilation: Secondary | ICD-10-CM

## 2022-04-05 DIAGNOSIS — H353221 Exudative age-related macular degeneration, left eye, with active choroidal neovascularization: Secondary | ICD-10-CM

## 2022-04-05 DIAGNOSIS — G4733 Obstructive sleep apnea (adult) (pediatric): Secondary | ICD-10-CM | POA: Diagnosis not present

## 2022-04-05 DIAGNOSIS — H35372 Puckering of macula, left eye: Secondary | ICD-10-CM

## 2022-04-05 MED ORDER — AFLIBERCEPT 2MG/0.05ML IZ SOLN FOR KALEIDOSCOPE
2.0000 mg | INTRAVITREAL | Status: AC | PRN
  Administered 2022-04-05: 2 mg via INTRAVITREAL

## 2022-04-05 NOTE — Assessment & Plan Note (Signed)
Not causative to subretinal fluid continue to monitor

## 2022-04-05 NOTE — Progress Notes (Signed)
04/05/2022     CHIEF COMPLAINT Patient presents for  Chief Complaint  Patient presents with   Macular Degeneration      HISTORY OF PRESENT ILLNESS: [name] is a 66 y.o. male who presents to the clinic today for:   HPI   5 weeks dilate OD Eyela OCT, symptomatically vision does appear stable.  3 weeks post most recent injection left eye.  No worsening of vision. Pt states his vision has been stable  Pt denies any new floaters or FOL  Last edited by Edmon Crape, MD on 04/05/2022  8:32 AM.      Referring physician: Alease Medina, MD [address] [place],  [place] [address]  HISTORICAL INFORMATION:   Selected notes from the MEDICAL RECORD NUMBER    Lab Results  Component Value Date   HGBA1C 5.4 07/28/2021     CURRENT MEDICATIONS: No current outpatient medications on file. (Ophthalmic Drugs)   No current facility-administered medications for this visit. (Ophthalmic Drugs)   Current Outpatient Medications (Other)  Medication Sig   albuterol (PROVENTIL) (2.5 MG/3ML) 0.083% nebulizer solution Take 3 mLs (2.5 mg total) by nebulization every 6 (six) hours as needed for wheezing or shortness of breath.   atorvastatin (LIPITOR) 40 MG tablet Take 1 tablet (40 mg total) by mouth daily.   Budeson-Glycopyrrol-Formoterol (BREZTRI AEROSPHERE) 160-9-4.8 MCG/ACT AERO Inhale 2 puffs into the lungs in the morning and at bedtime. (Patient not taking: Reported on 03/28/2022)   cholecalciferol (VITAMIN D) 25 MCG tablet Place 2 tablets (2,000 Units total) into feeding tube daily.   folic acid (FOLVITE) 1 MG tablet Place 1 tablet (1 mg total) into feeding tube daily.   ibuprofen (ADVIL) 600 MG tablet Place 1 tablet (600 mg total) into feeding tube every 6 (six) hours as needed for moderate pain.   METOPROLOL TARTRATE PO Take 25 mg by mouth.   Multiple Vitamin (MULTIVITAMIN WITH MINERALS) TABS tablet Place 1 tablet into feeding tube daily.   predniSONE (DELTASONE) 10 MG tablet 4 X 2  DAYS, 3 X 2 DAYS, 2 X 2 DAYS, 1 X 2 DAYS   VENTOLIN HFA 108 (90 Base) MCG/ACT inhaler Inhale into the lungs.   No current facility-administered medications for this visit. (Other)      REVIEW OF SYSTEMS: ROS   Negative for: Constitutional, Gastrointestinal, Neurological, Skin, Genitourinary, Musculoskeletal, HENT, Endocrine, Cardiovascular, Eyes, Respiratory, Psychiatric, Allergic/Imm, Heme/Lymph Last edited by Aleene Davidson, CMA on 04/05/2022  8:02 AM.       ALLERGIES No Known Allergies  PAST MEDICAL HISTORY Past Medical History:  Diagnosis Date   COPD (chronic obstructive pulmonary disease) (HCC)    COPD (chronic obstructive pulmonary disease) (HCC)    GERD (gastroesophageal reflux disease)    History of hepatitis C    treated several years ago through [facility]   Hyperlipidemia    Hypertension    OSA (obstructive sleep apnea)    Past Surgical History:  Procedure Laterality Date   CATARACT EXTRACTION, BILATERAL     EYE SURGERY Bilateral    IR THORACENTESIS ASP PLEURAL SPACE W/IMG GUIDE  09/14/2021   IR THORACENTESIS ASP PLEURAL SPACE W/IMG GUIDE  09/21/2021   ORIF PELVIC FRACTURE WITH PERCUTANEOUS SCREWS Right 07/29/2021   Procedure: SI SCREW FIXATION OF RIGHT PELVIC RING;  Surgeon: Myrene Galas, MD;  Location: [facility] OR;  Service: Orthopedics;  Laterality: Right;   ORTHOPEDIC SURGERY Bilateral    pt states fracture repairs to arms and legs.  REVERSE SHOULDER ARTHROPLASTY Left 09/23/2019   Procedure: REVERSE SHOULDER ARTHROPLASTY;  Surgeon: Jones Broom, MD;  Location: [facility];  Service: Orthopedics;  Laterality: Left;   TRACHEOSTOMY TUBE PLACEMENT N/A 08/10/2021   Procedure: TRACHEOSTOMY;  Surgeon: Violeta Gelinas, MD;  Location: [facility] OR;  Service: General;  Laterality: N/A;    FAMILY HISTORY Family History  Problem Relation Age of Onset   Heart disease Mother    Hypertension Father     SOCIAL HISTORY Social History   Tobacco Use   Smoking status: Former     Packs/day: 1.00    Years: 35.00    Total pack years: 35.00    Types: Cigarettes    Start date: 27    Quit date: 10/01/2010    Years since quitting: 11.5   Smokeless tobacco: Never  Vaping Use   Vaping Use: Never used  Substance Use Topics   Alcohol use: Yes    Alcohol/week: 42.0 standard drinks of alcohol    Types: 42 Cans of beer per week    Comment: daily - 6 drinks a day   Drug use: Never    Comment: last marijuana 2-3 weeks ago         OPHTHALMIC EXAM:  Base Eye Exam     Visual Acuity (Snellen - Linear)       Right Left   Dist [place] 20/40 20/150   Dist cc  20/80         Tonometry (Tonopen, 8:08 AM)       Right Left   Pressure 14 11         Pupils       Pupils Shape APD   Right PERRL Round None   Left PERRL  None         Visual Fields       Left Right    Full Full         Extraocular Movement       Right Left    Full, Ortho Full, Ortho         Neuro/Psych     Oriented x3: Yes   Mood/Affect: Normal         Dilation     Right eye: 2.5% Phenylephrine, 1.0% Mydriacyl @ 8:05 AM           Slit Lamp and Fundus Exam     External Exam       Right Left   External Normal Normal         Slit Lamp Exam       Right Left   Lids/Lashes Normal Normal   Conjunctiva/Sclera White and quiet White and quiet   Cornea Clear Clear   Anterior Chamber Deep and quiet Deep and quiet   Iris Round and reactive Round and reactive   Lens Posterior chamber intraocular lens Posterior chamber intraocular lens   Anterior Vitreous Normal Normal         Fundus Exam       Right Left   Posterior Vitreous Vitrectomized    Disc Normal    C/D Ratio 0.3    Macula Retinal pigment epithelial mottling, no disciform scar, no macular thickening, no hemorrhage    Vessels Normal    Periphery Normal, Good laser retinopexy temporally and inferonasal from prior retinal detachment repair             IMAGING AND PROCEDURES  Imaging and Procedures  for 04/05/22  OCT, Retina - OU - Both Eyes  Right Eye Quality was good. Scan locations included subfoveal. Central Foveal Thickness: 255. Progression has improved. Findings include abnormal foveal contour.   Left Eye Quality was good. Scan locations included subfoveal. Central Foveal Thickness: 293. Progression has improved. Findings include abnormal foveal contour, epiretinal membrane, subretinal fluid.   Notes OD previously, April 2023, with region of subretinal hyper reflective material and a subtle amount of subretinal fluid superonasal to the FAZ OD.  Vastly improved OD, at 5 weeks, repeat injection Eylea OD today   OS 3 weeks post most recent injection Eylea.  Vastly improved subretinal fluid at 3 weeks post most recent injection left eye.  Follow-up as scheduled OS     Intravitreal Injection, Pharmacologic Agent - OD - Right Eye       Time Out 04/05/2022. 8:34 AM. Confirmed correct patient, procedure, site, and patient consented.   Anesthesia Topical anesthesia was used. Anesthetic medications included Lidocaine 4%.   Procedure Preparation included 5% betadine to ocular surface, 10% betadine to eyelids, Ofloxacin . A 30 gauge needle was used.   Injection: 2 mg aflibercept 2 MG/0.05ML   Route: Intravitreal, Site: Right Eye   NDC: L6038910, Lot: [id number], Expiration date: 09/19/2022, Waste: 0 mL   Post-op Post injection exam found visual acuity of at least counting fingers. The patient tolerated the procedure well. There were no complications. The patient received written and verbal post procedure care education. Post injection medications were not given.              ASSESSMENT/PLAN:  Exudative age-related macular degeneration of right eye with active choroidal neovascularization (HCC) OD now vastly improved no residual fluid.  Preserved acuity.  Currently at 5-week interval post Eylea.  Repeat injection today and reevaluate next in 5 to 6  weeks  Nocturnal hypoxemia Improved with CPAP therapy  Obstructive sleep apnea hypopnea, severe Doing well now with resumption of CPAP therapy which likely augments the antivegF effect on his eyes due to lack of nightly hypoxemic stress upon the macula treated with CPAP  Left epiretinal membrane Not causative to subretinal fluid continue to monitor     ICD-10-CM   1. Exudative age-related macular degeneration of right eye with active choroidal neovascularization (HCC)  H35.3211 OCT, Retina - OU - Both Eyes    Intravitreal Injection, Pharmacologic Agent - OD - Right Eye    aflibercept (EYLEA) SOLN 2 mg    2. Exudative age-related macular degeneration of left eye with active choroidal neovascularization (HCC)  H35.3221     3. Nocturnal hypoxemia  G47.34     4. Obstructive sleep apnea hypopnea, severe  G47.33     5. Left epiretinal membrane  H35.372       1.  Macular condition the right eye continues to stabilize post resumption of CPAP for his nightly hypoxemia but most importantly for antivegF resumption.  Currently at 5-week interval post Eylea.  Repeat injection OD today for wet AMD and extend interval next to 6 weeks OD  2.  OS doing well at 3 weeks post most recent injection thus medication is effective on subretinal fluid.  Follow-up as scheduled  3.  Ophthalmic Meds Ordered this visit:  Meds ordered this encounter  Medications   aflibercept (EYLEA) SOLN 2 mg       Return in about 6 weeks (around 05/17/2022) for dilate, OD, EYLEA OCT,, and dilate OS Eylea OCT as scheduled.  There are no Patient Instructions on file for this visit.   Explained the diagnoses, plan, and  follow up with the patient and they expressed understanding.  Patient expressed understanding of the importance of proper follow up care.   Alford Highland Renetta Suman M.D. Diseases & Surgery of the Retina and Vitreous [facility] 04/05/22     Abbreviations: M myopia (nearsighted); A  astigmatism; H hyperopia (farsighted); P presbyopia; Mrx spectacle prescription;  CTL contact lenses; OD right eye; OS left eye; OU both eyes  XT exotropia; ET esotropia; PEK punctate epithelial keratitis; PEE punctate epithelial erosions; DES dry eye syndrome; MGD meibomian gland dysfunction; ATs artificial tears; PFAT's preservative free artificial tears; NSC nuclear sclerotic cataract; PSC posterior subcapsular cataract; ERM epi-retinal membrane; PVD posterior vitreous detachment; RD retinal detachment; DM diabetes mellitus; DR diabetic retinopathy; NPDR non-proliferative diabetic retinopathy; PDR proliferative diabetic retinopathy; CSME clinically significant macular edema; DME diabetic macular edema; dbh dot blot hemorrhages; CWS cotton wool spot; POAG primary open angle glaucoma; C/D cup-to-disc ratio; HVF humphrey visual field; GVF goldmann visual field; OCT optical coherence tomography; IOP intraocular pressure; BRVO Branch retinal vein occlusion; CRVO central retinal vein occlusion; CRAO central retinal artery occlusion; BRAO branch retinal artery occlusion; RT retinal tear; SB scleral buckle; PPV pars plana vitrectomy; VH Vitreous hemorrhage; PRP panretinal laser photocoagulation; IVK intravitreal kenalog; VMT vitreomacular traction; MH Macular hole;  NVD neovascularization of the disc; NVE neovascularization elsewhere; AREDS age related eye disease study; ARMD age related macular degeneration; POAG primary open angle glaucoma; EBMD epithelial/anterior basement membrane dystrophy; ACIOL anterior chamber intraocular lens; IOL intraocular lens; PCIOL posterior chamber intraocular lens; Phaco/IOL phacoemulsification with intraocular lens placement; PRK photorefractive keratectomy; LASIK laser assisted in situ keratomileusis; HTN hypertension; DM diabetes mellitus; COPD chronic obstructive pulmonary disease

## 2022-04-05 NOTE — Assessment & Plan Note (Signed)
Doing well now with resumption of CPAP therapy which likely augments the antivegF effect on his eyes due to lack of nightly hypoxemic stress upon the macula treated with CPAP

## 2022-04-05 NOTE — Assessment & Plan Note (Signed)
OD now vastly improved no residual fluid.  Preserved acuity.  Currently at 5-week interval post Eylea.  Repeat injection today and reevaluate next in 5 to 6 weeks

## 2022-04-05 NOTE — Assessment & Plan Note (Signed)
Improved with CPAP therapy

## 2022-04-25 ENCOUNTER — Encounter (INDEPENDENT_AMBULATORY_CARE_PROVIDER_SITE_OTHER): Payer: BC Managed Care – PPO | Admitting: Ophthalmology

## 2022-04-26 ENCOUNTER — Encounter: Payer: Self-pay | Admitting: Internal Medicine

## 2022-04-26 DIAGNOSIS — G4733 Obstructive sleep apnea (adult) (pediatric): Secondary | ICD-10-CM

## 2022-05-03 ENCOUNTER — Encounter: Payer: Self-pay | Admitting: Internal Medicine

## 2022-05-05 ENCOUNTER — Encounter (INDEPENDENT_AMBULATORY_CARE_PROVIDER_SITE_OTHER): Payer: Self-pay | Admitting: Ophthalmology

## 2022-05-05 ENCOUNTER — Ambulatory Visit (INDEPENDENT_AMBULATORY_CARE_PROVIDER_SITE_OTHER): Payer: Medicare Other | Admitting: Ophthalmology

## 2022-05-05 DIAGNOSIS — H353221 Exudative age-related macular degeneration, left eye, with active choroidal neovascularization: Secondary | ICD-10-CM

## 2022-05-05 DIAGNOSIS — H35712 Central serous chorioretinopathy, left eye: Secondary | ICD-10-CM

## 2022-05-05 DIAGNOSIS — H35372 Puckering of macula, left eye: Secondary | ICD-10-CM

## 2022-05-05 DIAGNOSIS — G4733 Obstructive sleep apnea (adult) (pediatric): Secondary | ICD-10-CM

## 2022-05-05 DIAGNOSIS — Z8669 Personal history of other diseases of the nervous system and sense organs: Secondary | ICD-10-CM

## 2022-05-05 MED ORDER — AFLIBERCEPT 2MG/0.05ML IZ SOLN FOR KALEIDOSCOPE
2.0000 mg | INTRAVITREAL | Status: AC | PRN
  Administered 2022-05-05: 2 mg via INTRAVITREAL

## 2022-05-05 NOTE — Assessment & Plan Note (Signed)
Compliant currently with CPAP

## 2022-05-05 NOTE — Progress Notes (Signed)
05/05/2022     CHIEF COMPLAINT Patient presents for  Chief Complaint  Patient presents with   Macular Degeneration      HISTORY OF PRESENT ILLNESS: [name] is a 66 y.o. male who presents to the clinic today for:   HPI   7 weeks for DILATE OS, EYLEA, OCT. Pt stated vision remained stable since last visit.  Last edited by Angeline Slim on 05/05/2022  8:26 AM.      Referring physician: Alease Medina, MD [address] [place],  [place] [address]  HISTORICAL INFORMATION:   Selected notes from the MEDICAL RECORD NUMBER    Lab Results  Component Value Date   HGBA1C 5.4 07/28/2021     CURRENT MEDICATIONS: No current outpatient medications on file. (Ophthalmic Drugs)   No current facility-administered medications for this visit. (Ophthalmic Drugs)   Current Outpatient Medications (Other)  Medication Sig   albuterol (PROVENTIL) (2.5 MG/3ML) 0.083% nebulizer solution Take 3 mLs (2.5 mg total) by nebulization every 6 (six) hours as needed for wheezing or shortness of breath.   atorvastatin (LIPITOR) 40 MG tablet Take 1 tablet (40 mg total) by mouth daily.   Budeson-Glycopyrrol-Formoterol (BREZTRI AEROSPHERE) 160-9-4.8 MCG/ACT AERO Inhale 2 puffs into the lungs in the morning and at bedtime. (Patient not taking: Reported on 03/28/2022)   cholecalciferol (VITAMIN D) 25 MCG tablet Place 2 tablets (2,000 Units total) into feeding tube daily.   folic acid (FOLVITE) 1 MG tablet Place 1 tablet (1 mg total) into feeding tube daily.   ibuprofen (ADVIL) 600 MG tablet Place 1 tablet (600 mg total) into feeding tube every 6 (six) hours as needed for moderate pain.   METOPROLOL TARTRATE PO Take 25 mg by mouth.   Multiple Vitamin (MULTIVITAMIN WITH MINERALS) TABS tablet Place 1 tablet into feeding tube daily.   predniSONE (DELTASONE) 10 MG tablet 4 X 2 DAYS, 3 X 2 DAYS, 2 X 2 DAYS, 1 X 2 DAYS   VENTOLIN HFA 108 (90 Base) MCG/ACT inhaler Inhale into the lungs.   No current  facility-administered medications for this visit. (Other)      REVIEW OF SYSTEMS: ROS   Negative for: Constitutional, Gastrointestinal, Neurological, Skin, Genitourinary, Musculoskeletal, HENT, Endocrine, Cardiovascular, Eyes, Respiratory, Psychiatric, Allergic/Imm, Heme/Lymph Last edited by Angeline Slim on 05/05/2022  8:26 AM.       ALLERGIES No Known Allergies  PAST MEDICAL HISTORY Past Medical History:  Diagnosis Date   COPD (chronic obstructive pulmonary disease) (HCC)    COPD (chronic obstructive pulmonary disease) (HCC)    GERD (gastroesophageal reflux disease)    History of hepatitis C    treated several years ago through [facility]   Hyperlipidemia    Hypertension    OSA (obstructive sleep apnea)    Past Surgical History:  Procedure Laterality Date   CATARACT EXTRACTION, BILATERAL     EYE SURGERY Bilateral    IR THORACENTESIS ASP PLEURAL SPACE W/IMG GUIDE  09/14/2021   IR THORACENTESIS ASP PLEURAL SPACE W/IMG GUIDE  09/21/2021   ORIF PELVIC FRACTURE WITH PERCUTANEOUS SCREWS Right 07/29/2021   Procedure: SI SCREW FIXATION OF RIGHT PELVIC RING;  Surgeon: Myrene Galas, MD;  Location: [facility] OR;  Service: Orthopedics;  Laterality: Right;   ORTHOPEDIC SURGERY Bilateral    pt states fracture repairs to arms and legs.   REVERSE SHOULDER ARTHROPLASTY Left 09/23/2019   Procedure: REVERSE SHOULDER ARTHROPLASTY;  Surgeon: Jones Broom, MD;  Location: [facility];  Service: Orthopedics;  Laterality: Left;  TRACHEOSTOMY TUBE PLACEMENT N/A 08/10/2021   Procedure: TRACHEOSTOMY;  Surgeon: Violeta Gelinas, MD;  Location: [facility] OR;  Service: General;  Laterality: N/A;    FAMILY HISTORY Family History  Problem Relation Age of Onset   Heart disease Mother    Hypertension Father     SOCIAL HISTORY Social History   Tobacco Use   Smoking status: Former    Packs/day: 1.00    Years: 35.00    Total pack years: 35.00    Types: Cigarettes    Start date: 21    Quit date: 10/01/2010    Years  since quitting: 11.6   Smokeless tobacco: Never  Vaping Use   Vaping Use: Never used  Substance Use Topics   Alcohol use: Yes    Alcohol/week: 42.0 standard drinks of alcohol    Types: 42 Cans of beer per week    Comment: daily - 6 drinks a day   Drug use: Never    Comment: last marijuana 2-3 weeks ago         OPHTHALMIC EXAM:  Base Eye Exam     Visual Acuity (ETDRS)       Right Left   Dist [place] 20/40 -1 20/200   Dist ph [place] 20/30 +1 20/100         Tonometry (Tonopen, 8:31 AM)       Right Left   Pressure 9 8         Pupils       Pupils APD   Right PERRL None   Left PERRL None         Visual Fields       Left Right    Full Full         Extraocular Movement       Right Left    Full, Ortho Full, Ortho         Neuro/Psych     Oriented x3: Yes   Mood/Affect: Normal         Dilation     Left eye: 2.5% Phenylephrine, 1.0% Mydriacyl @ 8:31 AM           Slit Lamp and Fundus Exam     External Exam       Right Left   External Normal Normal         Slit Lamp Exam       Right Left   Lids/Lashes Dermatochalasis - upper lid Dermatochalasis - upper lid   Conjunctiva/Sclera White and quiet White and quiet   Cornea Clear Clear   Anterior Chamber Deep and quiet Deep and quiet   Iris Round and reactive Round and reactive   Lens Posterior chamber intraocular lens Posterior chamber intraocular lens   Anterior Vitreous Normal Normal         Fundus Exam       Right Left   Posterior Vitreous  Normal   Disc  Normal   C/D Ratio  0.3   Macula  Macular thickening, Retinal pigment epithelial mottling, Retinal pigment epithelial detachment, Epiretinal membrane, old serous rd inferior macula   Vessels  Normal   Periphery  Normal            IMAGING AND PROCEDURES  Imaging and Procedures for 05/05/22  OCT, Retina - OU - Both Eyes       Right Eye Quality was good. Scan locations included subfoveal. Central Foveal Thickness: 254.  Progression has improved. Findings include abnormal foveal contour.   Left Eye Quality was  good. Scan locations included subfoveal. Central Foveal Thickness: 355. Progression has improved. Findings include abnormal foveal contour, epiretinal membrane, subretinal fluid.   Notes OD previously, April 2023, with region of subretinal hyper reflective material and a subtle amount of subretinal fluid superonasal to the FAZ OD.  Vastly improved OD, at 5 weeks, repeat injection Eylea OD today   OS 7 weeks post most recent injection Eylea.  Vastly improved subretinal fluid at 3 weeks post most recent injection left eye and then now at 7 weeks, recurrence of subretinal fluid OS.  Repeat injection OS today      Intravitreal Injection, Pharmacologic Agent - OS - Left Eye       Time Out 05/05/2022. 8:50 AM. Confirmed correct patient, procedure, site, and patient consented.   Anesthesia Topical anesthesia was used. Anesthetic medications included Lidocaine 4%.   Procedure Preparation included 5% betadine to ocular surface. A 30 gauge needle was used.   Injection: 2 mg aflibercept 2 MG/0.05ML   Route: Intravitreal, Site: Left Eye   NDC: L6038910, Lot: [id number], Expiration date: 05/22/2023, Waste: 0 mL   Post-op Post injection exam found visual acuity of at least counting fingers. The patient tolerated the procedure well. There were no complications. The patient received written and verbal post procedure care education. Post injection medications were not given.              ASSESSMENT/PLAN:  Obstructive sleep apnea hypopnea, severe Compliant currently with CPAP  Exudative age-related macular degeneration of left eye with active choroidal neovascularization (HCC) OS, at 7 weeks post most recent injection, increases subretinal fluid post Eylea.  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  Left epiretinal membrane Persists OS.  History of retinal  detachment OD, 6-7 years previous, repaired, via vitrectomy laser, Dr. Luciana Axe     ICD-10-CM   1. Exudative age-related macular degeneration of left eye with active choroidal neovascularization (HCC)  H35.3221 OCT, Retina - OU - Both Eyes    Intravitreal Injection, Pharmacologic Agent - OS - Left Eye    aflibercept (EYLEA) SOLN 2 mg    2. Obstructive sleep apnea hypopnea, severe  G47.33     3. Central serous chorioretinopathy of left eye  H35.712     4. Left epiretinal membrane  H35.372     5. History of retinal detachment  Z86.69       1.  OD doing well at this time.  OS with persistent and now recurrent increased subretinal fluid post Eylea currently at 7-week interval.  Repeat injection Eylea OS today maintain 7 to 8-week interval as change of practice is occurring  2.  OD, follow-up as scheduled  3.  Ophthalmic Meds Ordered this visit:  Meds ordered this encounter  Medications   aflibercept (EYLEA) SOLN 2 mg       Return in about 8 weeks (around 06/30/2022) for dilate, OS, EYLEA OCT, and OD as scheduled Eylea OCT.  There are no Patient Instructions on file for this visit.   Explained the diagnoses, plan, and follow up with the patient and they expressed understanding.  Patient expressed understanding of the importance of proper follow up care.   Alford Highland Josphine Laffey M.D. Diseases & Surgery of the Retina and Vitreous [facility] 05/05/22     Abbreviations: M myopia (nearsighted); A astigmatism; H hyperopia (farsighted); P presbyopia; Mrx spectacle prescription;  CTL contact lenses; OD right eye; OS left eye; OU both eyes  XT exotropia; ET esotropia; PEK  punctate epithelial keratitis; PEE punctate epithelial erosions; DES dry eye syndrome; MGD meibomian gland dysfunction; ATs artificial tears; PFAT's preservative free artificial tears; [place] nuclear sclerotic cataract; PSC posterior subcapsular cataract; ERM epi-retinal membrane; PVD posterior vitreous  detachment; RD retinal detachment; DM diabetes mellitus; DR diabetic retinopathy; NPDR non-proliferative diabetic retinopathy; PDR proliferative diabetic retinopathy; CSME clinically significant macular edema; DME diabetic macular edema; dbh dot blot hemorrhages; CWS cotton wool spot; POAG primary open angle glaucoma; C/D cup-to-disc ratio; HVF humphrey visual field; GVF goldmann visual field; OCT optical coherence tomography; IOP intraocular pressure; BRVO Branch retinal vein occlusion; CRVO central retinal vein occlusion; CRAO central retinal artery occlusion; BRAO branch retinal artery occlusion; RT retinal tear; SB scleral buckle; PPV pars plana vitrectomy; VH Vitreous hemorrhage; PRP panretinal laser photocoagulation; IVK intravitreal kenalog; VMT vitreomacular traction; MH Macular hole;  NVD neovascularization of the disc; NVE neovascularization elsewhere; AREDS age related eye disease study; ARMD age related macular degeneration; POAG primary open angle glaucoma; EBMD epithelial/anterior basement membrane dystrophy; ACIOL anterior chamber intraocular lens; IOL intraocular lens; PCIOL posterior chamber intraocular lens; Phaco/IOL phacoemulsification with intraocular lens placement; [place] photorefractive keratectomy; LASIK laser assisted in situ keratomileusis; HTN hypertension; DM diabetes mellitus; COPD chronic obstructive pulmonary disease

## 2022-05-05 NOTE — Assessment & Plan Note (Addendum)
OD, 6-7 years previous, repaired, via vitrectomy laser, Dr. Luciana Axe

## 2022-05-05 NOTE — Assessment & Plan Note (Signed)
Component of wet AMD 

## 2022-05-05 NOTE — Assessment & Plan Note (Signed)
Persists OS.

## 2022-05-05 NOTE — Assessment & Plan Note (Signed)
OS, at 7 weeks post most recent injection, increases subretinal fluid post Eylea.

## 2022-05-06 ENCOUNTER — Other Ambulatory Visit: Payer: Self-pay | Admitting: Cardiovascular Disease

## 2022-05-11 LAB — LAB REPORT - SCANNED: EGFR (Non-African Amer.): 101

## 2022-05-18 ENCOUNTER — Ambulatory Visit (INDEPENDENT_AMBULATORY_CARE_PROVIDER_SITE_OTHER): Payer: Medicare Other | Admitting: Ophthalmology

## 2022-05-18 ENCOUNTER — Encounter (INDEPENDENT_AMBULATORY_CARE_PROVIDER_SITE_OTHER): Payer: Self-pay | Admitting: Ophthalmology

## 2022-05-18 DIAGNOSIS — H35712 Central serous chorioretinopathy, left eye: Secondary | ICD-10-CM

## 2022-05-18 DIAGNOSIS — H353211 Exudative age-related macular degeneration, right eye, with active choroidal neovascularization: Secondary | ICD-10-CM | POA: Diagnosis not present

## 2022-05-18 DIAGNOSIS — H35372 Puckering of macula, left eye: Secondary | ICD-10-CM

## 2022-05-18 MED ORDER — AFLIBERCEPT 2MG/0.05ML IZ SOLN FOR KALEIDOSCOPE
2.0000 mg | INTRAVITREAL | Status: AC | PRN
  Administered 2022-05-18: 2 mg via INTRAVITREAL

## 2022-05-18 NOTE — Assessment & Plan Note (Signed)
Component of wet AMD only slightly improved today post recent injection antivegF.  Was completely resolved as of October 22 in the past prior to lengthy delay in postponement of therapy due to injury and illness

## 2022-05-18 NOTE — Assessment & Plan Note (Signed)
OD stable and no recurrence of subretinal fluid currently in 6-week interval.  Post Eylea injection.  On therapy for the last 4 months.  Repeat injection today and extend interval next to 8 weeks

## 2022-05-18 NOTE — Progress Notes (Signed)
05/18/2022     CHIEF COMPLAINT Patient presents for  Chief Complaint  Patient presents with   Macular Degeneration    History of chronic recurrent wet AMD OU with more sensitive therapy and better acuity retained OD  HISTORY OF PRESENT ILLNESS: HY SWIATEK III is a 66 y.o. male who presents to the clinic today for:   HPI   6 weeks for DILATE OD, EYLEA OCT. Pt stated vision has remained stable.  Last edited by Angeline Slim on 05/18/2022  8:01 AM.      Referring physician: Alease Medina, MD [address] [place],  [place] [address]  HISTORICAL INFORMATION:   Selected notes from the MEDICAL RECORD NUMBER    Lab Results  Component Value Date   HGBA1C 5.4 07/28/2021     CURRENT MEDICATIONS: No current outpatient medications on file. (Ophthalmic Drugs)   No current facility-administered medications for this visit. (Ophthalmic Drugs)   Current Outpatient Medications (Other)  Medication Sig   albuterol (PROVENTIL) (2.5 MG/3ML) 0.083% nebulizer solution Take 3 mLs (2.5 mg total) by nebulization every 6 (six) hours as needed for wheezing or shortness of breath.   atorvastatin (LIPITOR) 40 MG tablet TAKE 1 TABLET BY MOUTH ONCE DAILY   Budeson-Glycopyrrol-Formoterol (BREZTRI AEROSPHERE) 160-9-4.8 MCG/ACT AERO Inhale 2 puffs into the lungs in the morning and at bedtime. (Patient not taking: Reported on 03/28/2022)   cholecalciferol (VITAMIN D) 25 MCG tablet Place 2 tablets (2,000 Units total) into feeding tube daily.   folic acid (FOLVITE) 1 MG tablet Place 1 tablet (1 mg total) into feeding tube daily.   ibuprofen (ADVIL) 600 MG tablet Place 1 tablet (600 mg total) into feeding tube every 6 (six) hours as needed for moderate pain.   METOPROLOL TARTRATE PO Take 25 mg by mouth.   Multiple Vitamin (MULTIVITAMIN WITH MINERALS) TABS tablet Place 1 tablet into feeding tube daily.   predniSONE (DELTASONE) 10 MG tablet 4 X 2 DAYS, 3 X 2 DAYS, 2 X 2 DAYS, 1 X 2 DAYS   VENTOLIN HFA 108 (90 Base)  MCG/ACT inhaler Inhale into the lungs.   No current facility-administered medications for this visit. (Other)      REVIEW OF SYSTEMS: ROS   Negative for: Constitutional, Gastrointestinal, Neurological, Skin, Genitourinary, Musculoskeletal, HENT, Endocrine, Cardiovascular, Eyes, Respiratory, Psychiatric, Allergic/Imm, Heme/Lymph Last edited by Angeline Slim on 05/18/2022  8:01 AM.       ALLERGIES No Known Allergies  PAST MEDICAL HISTORY Past Medical History:  Diagnosis Date   COPD (chronic obstructive pulmonary disease) (HCC)    COPD (chronic obstructive pulmonary disease) (HCC)    GERD (gastroesophageal reflux disease)    History of hepatitis C    treated several years ago through [facility]   Hyperlipidemia    Hypertension    OSA (obstructive sleep apnea)    Past Surgical History:  Procedure Laterality Date   CATARACT EXTRACTION, BILATERAL     EYE SURGERY Bilateral    IR THORACENTESIS ASP PLEURAL SPACE W/IMG GUIDE  09/14/2021   IR THORACENTESIS ASP PLEURAL SPACE W/IMG GUIDE  09/21/2021   ORIF PELVIC FRACTURE WITH PERCUTANEOUS SCREWS Right 07/29/2021   Procedure: SI SCREW FIXATION OF RIGHT PELVIC RING;  Surgeon: Myrene Galas, MD;  Location: [facility] OR;  Service: Orthopedics;  Laterality: Right;   ORTHOPEDIC SURGERY Bilateral    pt states fracture repairs to arms and legs.   REVERSE SHOULDER ARTHROPLASTY Left 09/23/2019   Procedure: REVERSE SHOULDER ARTHROPLASTY;  Surgeon: Jones Broom, MD;  Location: [facility];  Service: Orthopedics;  Laterality: Left;   TRACHEOSTOMY TUBE PLACEMENT N/A 08/10/2021   Procedure: TRACHEOSTOMY;  Surgeon: Violeta Gelinas, MD;  Location: [facility] OR;  Service: General;  Laterality: N/A;    FAMILY HISTORY Family History  Problem Relation Age of Onset   Heart disease Mother    Hypertension Father     SOCIAL HISTORY Social History   Tobacco Use   Smoking status: Former    Packs/day: 1.00    Years: 35.00    Total pack years: 35.00    Types: Cigarettes     Start date: 79    Quit date: 10/01/2010    Years since quitting: 11.6   Smokeless tobacco: Never  Vaping Use   Vaping Use: Never used  Substance Use Topics   Alcohol use: Yes    Alcohol/week: 42.0 standard drinks of alcohol    Types: 42 Cans of beer per week    Comment: daily - 6 drinks a day   Drug use: Never    Comment: last marijuana 2-3 weeks ago         OPHTHALMIC EXAM:  Base Eye Exam     Visual Acuity (ETDRS)       Right Left   Dist [place] 20/40 20/200   Dist ph [place] 20/30 -2 20/100 -1         Tonometry (Tonopen, 8:05 AM)       Right Left   Pressure 16 17         Pupils       Pupils APD   Right PERRL None   Left PERRL None         Visual Fields       Left Right    Full Full         Extraocular Movement       Right Left    Full, Ortho Full, Ortho         Neuro/Psych     Oriented x3: Yes   Mood/Affect: Normal         Dilation     Right eye: 2.5% Phenylephrine, 1.0% Mydriacyl @ 8:05 AM           Slit Lamp and Fundus Exam     External Exam       Right Left   External Normal Normal         Slit Lamp Exam       Right Left   Lids/Lashes Normal Normal   Conjunctiva/Sclera White and quiet White and quiet   Cornea Clear Clear   Anterior Chamber Deep and quiet Deep and quiet   Iris Round and reactive Round and reactive   Lens Posterior chamber intraocular lens Posterior chamber intraocular lens   Anterior Vitreous Normal Normal         Fundus Exam       Right Left   Posterior Vitreous Vitrectomized, clear    Disc Normal    C/D Ratio 0.3    Macula Retinal pigment epithelial mottling, no disciform scar, no macular thickening, no hemorrhage    Vessels Normal    Periphery Normal, Good laser retinopexy temporally and inferonasal from prior retinal detachment repair             IMAGING AND PROCEDURES  Imaging and Procedures for 05/18/22  OCT, Retina - OU - Both Eyes       Right Eye Quality was good. Scan  locations included subfoveal. Central Foveal Thickness: 260. Progression has  improved. Findings include abnormal foveal contour.   Left Eye Quality was good. Scan locations included subfoveal. Central Foveal Thickness: 334. Progression has improved. Findings include abnormal foveal contour, epiretinal membrane, subretinal fluid.   Notes OD previously, April 2023, with region of subretinal hyper reflective material and a subtle amount of subretinal fluid superonasal to the FAZ OD.  Vastly improved OD, at 6 weeks, repeat injection Eylea OD today   OS 2 weeks post most recent injection Eylea.  Vastly improved subretinal fluid at 2 weeks post most recent injection left eye with persistence and slight improvement of subretinal fluid OS     Intravitreal Injection, Pharmacologic Agent - OD - Right Eye       Time Out 05/18/2022. 8:20 AM. Confirmed correct patient, procedure, site, and patient consented.   Anesthesia Topical anesthesia was used. Anesthetic medications included Lidocaine 4%.   Procedure Preparation included 5% betadine to ocular surface, 10% betadine to eyelids, Ofloxacin . A 30 gauge needle was used.   Injection: 2 mg aflibercept 2 MG/0.05ML   Route: Intravitreal, Site: Right Eye   NDC: [id number], Lot: [id number], Expiration date: 12/19/2022, Waste: 0 mL   Post-op Post injection exam found visual acuity of at least counting fingers. The patient tolerated the procedure well. There were no complications. The patient received written and verbal post procedure care education. Post injection medications were not given.              ASSESSMENT/PLAN:  Exudative age-related macular degeneration of right eye with active choroidal neovascularization (HCC) OD stable and no recurrence of subretinal fluid currently in 6-week interval.  Post Eylea injection.  On therapy for the last 4 months.  Repeat injection today and extend interval next to 8 weeks  Central serous  chorioretinopathy of left eye Component of wet AMD only slightly improved today post recent injection antivegF.  Was completely resolved as of October 22 in the past prior to lengthy delay in postponement of therapy due to injury and illness  Left epiretinal membrane Persistent.     ICD-10-CM   1. Exudative age-related macular degeneration of right eye with active choroidal neovascularization (HCC)  H35.3211 OCT, Retina - OU - Both Eyes    Intravitreal Injection, Pharmacologic Agent - OD - Right Eye    aflibercept (EYLEA) SOLN 2 mg    2. Central serous chorioretinopathy of left eye  H35.712     3. Left epiretinal membrane  H35.372       1.  Repeat injection intravitreal Eylea OD today and extend interval examination next to 8 weeks  2.  OS persistent CS CR type fluid with wet AMD still has not recovered to October 2022 levels.  Persistent subretinal fluid will have a deleterious impact of visual acuity long-term, follow-up as scheduled  3.  Ophthalmic Meds Ordered this visit:  Meds ordered this encounter  Medications   aflibercept (EYLEA) SOLN 2 mg       Return in about 8 weeks (around 07/13/2022) for dilate, OD, EYLEA OCT, and follow-up OS dilate as scheduled.  There are no Patient Instructions on file for this visit.   Explained the diagnoses, plan, and follow up with the patient and they expressed understanding.  Patient expressed understanding of the importance of proper follow up care.   Clent Demark Mackie Holness M.D. Diseases & Surgery of the Retina and Vitreous [facility] 05/18/22     Abbreviations: M myopia (nearsighted); A astigmatism; H hyperopia (farsighted); P presbyopia; Mrx spectacle prescription;  CTL contact lenses; OD right eye; OS left eye; OU both eyes  XT exotropia; ET esotropia; PEK punctate epithelial keratitis; PEE punctate epithelial erosions; DES dry eye syndrome; MGD meibomian gland dysfunction; ATs artificial tears; PFAT's preservative  free artificial tears; [place] nuclear sclerotic cataract; PSC posterior subcapsular cataract; ERM epi-retinal membrane; PVD posterior vitreous detachment; RD retinal detachment; DM diabetes mellitus; DR diabetic retinopathy; NPDR non-proliferative diabetic retinopathy; PDR proliferative diabetic retinopathy; CSME clinically significant macular edema; DME diabetic macular edema; dbh dot blot hemorrhages; CWS cotton wool spot; POAG primary open angle glaucoma; C/D cup-to-disc ratio; HVF humphrey visual field; GVF goldmann visual field; OCT optical coherence tomography; IOP intraocular pressure; BRVO Branch retinal vein occlusion; CRVO central retinal vein occlusion; CRAO central retinal artery occlusion; BRAO branch retinal artery occlusion; RT retinal tear; SB scleral buckle; PPV pars plana vitrectomy; VH Vitreous hemorrhage; PRP panretinal laser photocoagulation; IVK intravitreal kenalog; VMT vitreomacular traction; MH Macular hole;  NVD neovascularization of the disc; NVE neovascularization elsewhere; AREDS age related eye disease study; ARMD age related macular degeneration; POAG primary open angle glaucoma; EBMD epithelial/anterior basement membrane dystrophy; ACIOL anterior chamber intraocular lens; IOL intraocular lens; PCIOL posterior chamber intraocular lens; Phaco/IOL phacoemulsification with intraocular lens placement; [place] photorefractive keratectomy; LASIK laser assisted in situ keratomileusis; HTN hypertension; DM diabetes mellitus; COPD chronic obstructive pulmonary disease

## 2022-05-18 NOTE — Assessment & Plan Note (Signed)
Persistent

## 2022-06-15 ENCOUNTER — Other Ambulatory Visit: Payer: Self-pay | Admitting: Internal Medicine

## 2022-06-22 ENCOUNTER — Encounter (INDEPENDENT_AMBULATORY_CARE_PROVIDER_SITE_OTHER): Payer: Self-pay | Admitting: Ophthalmology

## 2022-06-22 ENCOUNTER — Ambulatory Visit (INDEPENDENT_AMBULATORY_CARE_PROVIDER_SITE_OTHER): Payer: Medicare Other | Admitting: Ophthalmology

## 2022-06-22 DIAGNOSIS — H35372 Puckering of macula, left eye: Secondary | ICD-10-CM

## 2022-06-22 DIAGNOSIS — H35712 Central serous chorioretinopathy, left eye: Secondary | ICD-10-CM

## 2022-06-22 DIAGNOSIS — H353221 Exudative age-related macular degeneration, left eye, with active choroidal neovascularization: Secondary | ICD-10-CM

## 2022-06-22 DIAGNOSIS — H353211 Exudative age-related macular degeneration, right eye, with active choroidal neovascularization: Secondary | ICD-10-CM

## 2022-06-22 MED ORDER — AFLIBERCEPT 2MG/0.05ML IZ SOLN FOR KALEIDOSCOPE
2.0000 mg | INTRAVITREAL | Status: AC | PRN
  Administered 2022-06-22: 2 mg via INTRAVITREAL

## 2022-06-22 NOTE — Assessment & Plan Note (Signed)
OD stable today with stable acuity currently at 5 weeks post most recent injection follow-up in 3 weeks as scheduled

## 2022-06-22 NOTE — Assessment & Plan Note (Signed)
Component of wet AMD OS 

## 2022-06-22 NOTE — Assessment & Plan Note (Signed)
OS today at 6 weeks post most recent injection.  Chronic and perhaps recurrent subretinal fluid today based upon the serous component of disease as patient recently was on systemic steroids, prednisone, for lung disease.

## 2022-06-22 NOTE — Progress Notes (Signed)
06/22/2022     CHIEF COMPLAINT Patient presents for  Chief Complaint  Patient presents with   Macular Degeneration      HISTORY OF PRESENT ILLNESS: [name] is a 66 y.o. male who presents to the clinic today for:   HPI   Age realted macular degeneration of right eye , OS with hix of serous rd, recently on prednisone for lung disease. 8 weeks dilate os eylea oct Pt states his vision has been stable Pt denies any new floaters or FOL Last edited by Edmon Crape, MD on 06/22/2022  8:34 AM.      Referring physician: Alease Medina, MD [address] [place],  [place] [address]  HISTORICAL INFORMATION:   Selected notes from the MEDICAL RECORD NUMBER    Lab Results  Component Value Date   HGBA1C 5.4 07/28/2021     CURRENT MEDICATIONS: No current outpatient medications on file. (Ophthalmic Drugs)   No current facility-administered medications for this visit. (Ophthalmic Drugs)   Current Outpatient Medications (Other)  Medication Sig   albuterol (PROVENTIL) (2.5 MG/3ML) 0.083% nebulizer solution Take 3 mLs (2.5 mg total) by nebulization every 6 (six) hours as needed for wheezing or shortness of breath.   atorvastatin (LIPITOR) 40 MG tablet TAKE 1 TABLET BY MOUTH ONCE DAILY   cholecalciferol (VITAMIN D) 25 MCG tablet Place 2 tablets (2,000 Units total) into feeding tube daily.   Fluticasone-Umeclidin-Vilant (TRELEGY ELLIPTA) 100-62.5-25 MCG/ACT AEPB INHALE 1 PUFF INTO THE LUNGS EVERY DAY AT THE SAME TIME EACH DAY   folic acid (FOLVITE) 1 MG tablet Place 1 tablet (1 mg total) into feeding tube daily.   ibuprofen (ADVIL) 600 MG tablet Place 1 tablet (600 mg total) into feeding tube every 6 (six) hours as needed for moderate pain.   METOPROLOL TARTRATE PO Take 25 mg by mouth.   Multiple Vitamin (MULTIVITAMIN WITH MINERALS) TABS tablet Place 1 tablet into feeding tube daily.   predniSONE (DELTASONE) 10 MG tablet 4 X 2 DAYS, 3 X 2 DAYS, 2 X 2 DAYS, 1 X 2 DAYS   VENTOLIN HFA  108 (90 Base) MCG/ACT inhaler Inhale into the lungs.   No current facility-administered medications for this visit. (Other)      REVIEW OF SYSTEMS: ROS   Negative for: Constitutional, Gastrointestinal, Neurological, Skin, Genitourinary, Musculoskeletal, HENT, Endocrine, Cardiovascular, Eyes, Respiratory, Psychiatric, Allergic/Imm, Heme/Lymph Last edited by Aleene Davidson, CMA on 06/22/2022  8:02 AM.       ALLERGIES No Known Allergies  PAST MEDICAL HISTORY Past Medical History:  Diagnosis Date   COPD (chronic obstructive pulmonary disease) (HCC)    COPD (chronic obstructive pulmonary disease) (HCC)    GERD (gastroesophageal reflux disease)    History of hepatitis C    treated several years ago through [facility]   Hyperlipidemia    Hypertension    OSA (obstructive sleep apnea)    Past Surgical History:  Procedure Laterality Date   CATARACT EXTRACTION, BILATERAL     EYE SURGERY Bilateral    IR THORACENTESIS ASP PLEURAL SPACE W/IMG GUIDE  09/14/2021   IR THORACENTESIS ASP PLEURAL SPACE W/IMG GUIDE  09/21/2021   ORIF PELVIC FRACTURE WITH PERCUTANEOUS SCREWS Right 07/29/2021   Procedure: SI SCREW FIXATION OF RIGHT PELVIC RING;  Surgeon: Myrene Galas, MD;  Location: [facility] OR;  Service: Orthopedics;  Laterality: Right;   ORTHOPEDIC SURGERY Bilateral    pt states fracture repairs to arms and legs.   REVERSE SHOULDER ARTHROPLASTY Left 09/23/2019  Procedure: REVERSE SHOULDER ARTHROPLASTY;  Surgeon: Jones Broom, MD;  Location: [facility];  Service: Orthopedics;  Laterality: Left;   TRACHEOSTOMY TUBE PLACEMENT N/A 08/10/2021   Procedure: TRACHEOSTOMY;  Surgeon: Violeta Gelinas, MD;  Location: [facility] OR;  Service: General;  Laterality: N/A;    FAMILY HISTORY Family History  Problem Relation Age of Onset   Heart disease Mother    Hypertension Father     SOCIAL HISTORY Social History   Tobacco Use   Smoking status: Former    Packs/day: 1.00    Years: 35.00    Total pack years:  35.00    Types: Cigarettes    Start date: 9    Quit date: 10/01/2010    Years since quitting: 11.7   Smokeless tobacco: Never  Vaping Use   Vaping Use: Never used  Substance Use Topics   Alcohol use: Yes    Alcohol/week: 42.0 standard drinks of alcohol    Types: 42 Cans of beer per week    Comment: daily - 6 drinks a day   Drug use: Never    Comment: last marijuana 2-3 weeks ago         OPHTHALMIC EXAM:  Base Eye Exam     Visual Acuity (ETDRS)       Right Left   Dist [place] 20/30 20/100 -1         Tonometry (Tonopen, 8:06 AM)       Right Left   Pressure 9 9         Pupils       Pupils   Right PERRL   Left PERRL         Extraocular Movement       Right Left    Ortho Ortho    -- -- --  --  --  -- -- --   -- -- --  --  --  -- -- --           Neuro/Psych     Oriented x3: Yes   Mood/Affect: Normal         Dilation     Left eye: 2.5% Phenylephrine, 1.0% Mydriacyl @ 8:04 AM           Slit Lamp and Fundus Exam     External Exam       Right Left   External Normal Normal         Slit Lamp Exam       Right Left   Lids/Lashes Normal Normal   Conjunctiva/Sclera White and quiet White and quiet   Cornea Clear Clear   Anterior Chamber Deep and quiet Deep and quiet   Iris Round and reactive Round and reactive   Lens Posterior chamber intraocular lens Posterior chamber intraocular lens   Anterior Vitreous Normal Normal         Fundus Exam       Right Left   Posterior Vitreous  Normal   Disc  Normal   C/D Ratio  0.3   Macula  Macular thickening, Retinal pigment epithelial mottling, Retinal pigment epithelial detachment, Epiretinal membrane, old serous rd inferior macula   Vessels  Normal   Periphery  Normal            IMAGING AND PROCEDURES  Imaging and Procedures for 06/22/22  OCT, Retina - OU - Both Eyes       Right Eye Quality was good. Scan locations included subfoveal. Central Foveal Thickness: 263.  Progression has improved. Findings include abnormal  foveal contour.   Left Eye Quality was good. Scan locations included subfoveal. Central Foveal Thickness: 386. Progression has improved. Findings include abnormal foveal contour, epiretinal membrane, subretinal fluid.   Notes OD previously, April 2023, with region of subretinal hyper reflective material and a subtle amount of subretinal fluid superonasal to the FAZ OD.  Vastly improved OD, at 5 weeks, repeat injection Eylea OD today   OS 7 weeks post most recent injection Eylea.  Chronic active subretinal fluid at  7 weeks post most recent injection left eye with persistence and slight perhaps due to recent prednisone use  systemically      Intravitreal Injection, Pharmacologic Agent - OS - Left Eye       Time Out 06/22/2022. 8:36 AM. Confirmed correct patient, procedure, site, and patient consented.   Anesthesia Topical anesthesia was used. Anesthetic medications included Lidocaine 4%.   Procedure Preparation included 5% betadine to ocular surface. A 30 gauge needle was used.   Injection: 2 mg aflibercept 2 MG/0.05ML   Route: Intravitreal, Site: Left Eye   NDC: [id number], Lot: [id number], Expiration date: 05/22/2023, Waste: 0 mL   Post-op Post injection exam found visual acuity of at least counting fingers. The patient tolerated the procedure well. There were no complications. The patient received written and verbal post procedure care education. Post injection medications were not given.              ASSESSMENT/PLAN:  Exudative age-related macular degeneration of right eye with active choroidal neovascularization (HCC) OD stable today with stable acuity currently at 5 weeks post most recent injection follow-up in 3 weeks as scheduled  Exudative age-related macular degeneration of left eye with active choroidal neovascularization ([place]) OS today at 6 weeks post most recent injection.  Chronic and perhaps recurrent  subretinal fluid today based upon the serous component of disease as patient recently was on systemic steroids, prednisone, for lung disease.  Central serous chorioretinopathy of left eye Component of wet AMD OS  Left epiretinal membrane May be contributing to the taut elevation of the macula and serous elevation.  May have to address this 1 day in the future     ICD-10-CM   1. Exudative age-related macular degeneration of left eye with active choroidal neovascularization (HCC)  H35.3221 Intravitreal Injection, Pharmacologic Agent - OS - Left Eye    aflibercept (EYLEA) SOLN 2 mg    2. Exudative age-related macular degeneration of right eye with active choroidal neovascularization (HCC)  H35.3211 OCT, Retina - OU - Both Eyes    3. Central serous chorioretinopathy of left eye  H35.712     4. Left epiretinal membrane  H35.372       OS with chronic subretinal fluid recurrent today most likely some element of recent systemic steroid use. OS repeat Eylea today to maintain.  Next visit may consider fluorescein angiography left right eye and consideration of focal laser subthreshold, for any leaking spots determined to contribute to serous retinal elevation left eye   2.  3.  Ophthalmic Meds Ordered this visit:  Meds ordered this encounter  Medications   aflibercept (EYLEA) SOLN 2 mg       Return in about 6 weeks (around 08/03/2022) for dilate, OS, EYLEA OCT, and 3 weeks OD Eylea OCT as scheduled.  There are no Patient Instructions on file for this visit.   Explained the diagnoses, plan, and follow up with the patient and they expressed understanding.  Patient expressed understanding of the importance of proper follow up  care.   Alford [place]. Christinna Sprung M.D. Diseases & Surgery of the Retina and Vitreous [facility] 06/22/22     Abbreviations: M myopia (nearsighted); A astigmatism; H hyperopia (farsighted); P presbyopia; Mrx spectacle prescription;  CTL contact  lenses; OD right eye; OS left eye; OU both eyes  XT exotropia; ET esotropia; PEK punctate epithelial keratitis; PEE punctate epithelial erosions; DES dry eye syndrome; MGD meibomian gland dysfunction; ATs artificial tears; PFAT's preservative free artificial tears; [place] nuclear sclerotic cataract; PSC posterior subcapsular cataract; ERM epi-retinal membrane; PVD posterior vitreous detachment; RD retinal detachment; DM diabetes mellitus; DR diabetic retinopathy; NPDR non-proliferative diabetic retinopathy; PDR proliferative diabetic retinopathy; CSME clinically significant macular edema; DME diabetic macular edema; dbh dot blot hemorrhages; CWS cotton wool spot; POAG primary open angle glaucoma; C/D cup-to-disc ratio; HVF humphrey visual field; GVF goldmann visual field; OCT optical coherence tomography; IOP intraocular pressure; BRVO Branch retinal vein occlusion; CRVO central retinal vein occlusion; CRAO central retinal artery occlusion; BRAO branch retinal artery occlusion; RT retinal tear; SB scleral buckle; PPV pars plana vitrectomy; VH Vitreous hemorrhage; PRP panretinal laser photocoagulation; IVK intravitreal kenalog; VMT vitreomacular traction; MH Macular hole;  NVD neovascularization of the disc; NVE neovascularization elsewhere; AREDS age related eye disease study; ARMD age related macular degeneration; POAG primary open angle glaucoma; EBMD epithelial/anterior basement membrane dystrophy; ACIOL anterior chamber intraocular lens; IOL intraocular lens; PCIOL posterior chamber intraocular lens; Phaco/IOL phacoemulsification with intraocular lens placement; PRK photorefractive keratectomy; LASIK laser assisted in situ keratomileusis; HTN hypertension; DM diabetes mellitus; COPD chronic obstructive pulmonary disease

## 2022-06-22 NOTE — Assessment & Plan Note (Signed)
May be contributing to the taut elevation of the macula and serous elevation.  May have to address this 1 day in the future

## 2022-06-23 ENCOUNTER — Encounter: Payer: Self-pay | Admitting: Nurse Practitioner

## 2022-06-23 ENCOUNTER — Ambulatory Visit (INDEPENDENT_AMBULATORY_CARE_PROVIDER_SITE_OTHER): Payer: Medicare Other | Admitting: Nurse Practitioner

## 2022-06-23 ENCOUNTER — Ambulatory Visit (INDEPENDENT_AMBULATORY_CARE_PROVIDER_SITE_OTHER): Payer: Medicare Other

## 2022-06-23 VITALS — BP 132/88 | HR 106 | Temp 97.7°F | Ht 74.0 in | Wt 234.2 lb

## 2022-06-23 DIAGNOSIS — G4733 Obstructive sleep apnea (adult) (pediatric): Secondary | ICD-10-CM

## 2022-06-23 DIAGNOSIS — J441 Chronic obstructive pulmonary disease with (acute) exacerbation: Secondary | ICD-10-CM

## 2022-06-23 DIAGNOSIS — J189 Pneumonia, unspecified organism: Secondary | ICD-10-CM

## 2022-06-23 MED ORDER — ALBUTEROL SULFATE (2.5 MG/3ML) 0.083% IN NEBU: 2.5000 mg | INHALATION_SOLUTION | Freq: Once | RESPIRATORY_TRACT | Status: DC

## 2022-06-23 MED ORDER — METHYLPREDNISOLONE ACETATE 80 MG/ML IJ SUSP: 80.0000 mg | Freq: Once | INTRAMUSCULAR | Status: DC

## 2022-06-23 MED ORDER — PREDNISONE 10 MG PO TABS: ORAL_TABLET | ORAL | 0 refills | Status: DC

## 2022-06-23 MED ORDER — AMOXICILLIN-POT CLAVULANATE 875-125 MG PO TABS: 1.0000 | ORAL_TABLET | Freq: Two times a day (BID) | ORAL | 0 refills | Status: AC

## 2022-06-23 NOTE — Patient Instructions (Addendum)
Continue Trelegy 1 puff daily.  Brush tongue and rinse mouth afterwards Continue Albuterol inhaler 2 puffs or 3 mL neb every 6 hours as needed for shortness of breath or wheezing. Notify if symptoms persist despite rescue inhaler/neb use.  Use neb twice daily until symptoms improve. Continue CPAP nightly with 2 lpm supplemental O2, minimum 4 to 6 hours  Prednisone taper. 4 tabs for 2 days, then 3 tabs for 2 days, 2 tabs for 2 days, then 1 tab for 2 days, then stop. Take in AM with food. Start tomorrow. Augmentin 1 tab Twice daily for 7 days. Take with food. Eat yogurt daily or take a daily probiotic while on antibiotic  Mucinex 1 tab Twice daily for chest congestion/cough  Follow up in one week with Dr. Annamaria Boots or Alanson Aly. If symptoms do not improve or worsen, please contact office for sooner follow up or seek emergency care.

## 2022-06-23 NOTE — Assessment & Plan Note (Addendum)
Infectious symptoms with new right midlung opacity, consistent with CAP. He is non-toxic appearing and oxygen stable on room air. He is sightly tachycardic but BP stable. He did improve after breathing treatment in office. We will treat him with augmentin 7 day course. Target mucociliary clearance. Advised to monitor his oxygen at home for goal >88-90%. Strict ED precautions. Walking oximetry today without desaturation on room air. Close follow up.  Patient Instructions  Continue Trelegy 1 puff daily.  Brush tongue and rinse mouth afterwards Continue Albuterol inhaler 2 puffs or 3 mL neb every 6 hours as needed for shortness of breath or wheezing. Notify if symptoms persist despite rescue inhaler/neb use.  Use neb twice daily until symptoms improve. Continue CPAP nightly with 2 lpm supplemental O2, minimum 4 to 6 hours  Prednisone taper. 4 tabs for 2 days, then 3 tabs for 2 days, 2 tabs for 2 days, then 1 tab for 2 days, then stop. Take in AM with food. Start tomorrow. Augmentin 1 tab Twice daily for 7 days. Take with food. Eat yogurt daily or take a daily probiotic while on antibiotic  Mucinex 1 tab Twice daily for chest congestion/cough  Follow up in one week with Dr. Annamaria Boots or Alanson Aly. If symptoms do not improve or worsen, please contact office for sooner follow up or seek emergency care.

## 2022-06-23 NOTE — Assessment & Plan Note (Signed)
OSA on CPAP with associated nocturnal hypoxemia using 2.5 lpm supplemental O2. We will f/u on download at follow up.

## 2022-06-23 NOTE — Assessment & Plan Note (Signed)
COPD mixed type with acute exacerbation r/t CAP today. Treated with depo 80 mg inj and prednisone taper. Continue scheduled bronchodilators and prn SABA. Encouraged to use neb twice daily until symptoms improve. See above.
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
## 2022-06-30 ENCOUNTER — Encounter (INDEPENDENT_AMBULATORY_CARE_PROVIDER_SITE_OTHER): Payer: PRIVATE HEALTH INSURANCE | Admitting: Ophthalmology

## 2022-07-05 ENCOUNTER — Encounter: Payer: Self-pay | Admitting: Nurse Practitioner

## 2022-07-05 ENCOUNTER — Encounter: Payer: Self-pay | Admitting: Internal Medicine

## 2022-07-05 ENCOUNTER — Ambulatory Visit (INDEPENDENT_AMBULATORY_CARE_PROVIDER_SITE_OTHER): Payer: Medicare Other | Admitting: Nurse Practitioner

## 2022-07-05 DIAGNOSIS — J441 Chronic obstructive pulmonary disease with (acute) exacerbation: Secondary | ICD-10-CM | POA: Diagnosis not present

## 2022-07-05 DIAGNOSIS — J189 Pneumonia, unspecified organism: Secondary | ICD-10-CM | POA: Diagnosis not present

## 2022-07-05 NOTE — Assessment & Plan Note (Signed)
Slow to resolve AECOPD related to recent CAP. Breathing was better on steroids. We will restart him on oral steroids with prednisone taper. Encouraged him to continue neb treatments twice daily until he feels back to baseline. Continue triple therapy with Trelegy. Close follow up.  Patient Instructions  Continue Trelegy 1 puff daily.  Brush tongue and rinse mouth afterwards Continue Albuterol inhaler 2 puffs or 3 mL neb every 6 hours as needed for shortness of breath or wheezing. Notify if symptoms persist despite rescue inhaler/neb use.  Use neb twice daily until symptoms improve. Continue CPAP nightly with 2 lpm supplemental O2, minimum 4 to 6 hours   Prednisone taper. 4 tabs for 3 days, then 3 tabs for 3 days, 2 tabs for 3 days, then 1 tab for 3 days, then stop. Take in AM with food Mucinex 1 tab Twice daily for chest congestion/cough  We will obtain repeat chest x ray in 6 weeks. If your cough returns or your breathing gets worse, please call me so we can repeat this sooner   Follow up in 7-10 days with Dr. Annamaria Boots or Alanson Aly. If symptoms do not improve or worsen, please contact office for sooner follow up or seek emergency care.
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
## 2022-07-05 NOTE — Telephone Encounter (Signed)
[name], please advise on pts pred taper. Can you confirm the pred taper you want sent in is 10mg  tabs? Thanks.

## 2022-07-05 NOTE — Assessment & Plan Note (Signed)
RML opacity on imaging from 10/5, consistent with pneumonia. Treated with 7 day course of augmentin. Infectious symptoms have improved. Repeat CXR in 6 weeks to ensure resolution. See above plan.

## 2022-07-05 NOTE — Patient Instructions (Addendum)
Continue Trelegy 1 puff daily.  Brush tongue and rinse mouth afterwards Continue Albuterol inhaler 2 puffs or 3 mL neb every 6 hours as needed for shortness of breath or wheezing. Notify if symptoms persist despite rescue inhaler/neb use.  Use neb twice daily until symptoms improve. Continue CPAP nightly with 2 lpm supplemental O2, minimum 4 to 6 hours   Prednisone taper. 4 tabs for 3 days, then 3 tabs for 3 days, 2 tabs for 3 days, then 1 tab for 3 days, then stop. Take in AM with food Mucinex 1 tab Twice daily for chest congestion/cough  We will obtain repeat chest x ray in 6 weeks. If your cough returns or your breathing gets worse, please call me so we can repeat this sooner   Follow up in 7-10 days with Dr. Annamaria Boots or Alanson Aly. If symptoms do not improve or worsen, please contact office for sooner follow up or seek emergency care.

## 2022-07-06 ENCOUNTER — Encounter: Payer: Self-pay | Admitting: Nurse Practitioner

## 2022-07-06 ENCOUNTER — Other Ambulatory Visit: Payer: Self-pay

## 2022-07-06 MED ORDER — PREDNISONE 10 MG PO TABS: ORAL_TABLET | ORAL | 0 refills | Status: AC

## 2022-07-06 MED ORDER — PREDNISONE 10 MG PO TABS: ORAL_TABLET | ORAL | 0 refills | Status: DC

## 2022-07-13 ENCOUNTER — Encounter (INDEPENDENT_AMBULATORY_CARE_PROVIDER_SITE_OTHER): Payer: Self-pay

## 2022-07-13 ENCOUNTER — Encounter (INDEPENDENT_AMBULATORY_CARE_PROVIDER_SITE_OTHER): Payer: PRIVATE HEALTH INSURANCE | Admitting: Ophthalmology

## 2022-07-15 ENCOUNTER — Encounter: Payer: Self-pay | Admitting: Nurse Practitioner

## 2022-07-15 ENCOUNTER — Ambulatory Visit (INDEPENDENT_AMBULATORY_CARE_PROVIDER_SITE_OTHER): Payer: Medicare Other | Admitting: Nurse Practitioner

## 2022-07-15 VITALS — BP 130/82 | HR 77 | Ht 73.0 in | Wt 236.2 lb

## 2022-07-15 DIAGNOSIS — J189 Pneumonia, unspecified organism: Secondary | ICD-10-CM

## 2022-07-15 DIAGNOSIS — J4489 Other specified chronic obstructive pulmonary disease: Secondary | ICD-10-CM

## 2022-07-15 DIAGNOSIS — J439 Emphysema, unspecified: Secondary | ICD-10-CM | POA: Diagnosis not present

## 2022-07-15 DIAGNOSIS — G4733 Obstructive sleep apnea (adult) (pediatric): Secondary | ICD-10-CM

## 2022-07-15 MED ORDER — ROFLUMILAST 500 MCG PO TABS: ORAL_TABLET | ORAL | 11 refills | Status: DC

## 2022-07-15 NOTE — Assessment & Plan Note (Signed)
Maintained on CPAP with excellent compliance per his report. Unable to view download and CPAP does not have SD card. He is working on getting one of these from the medical supply company. We will follow up on this today. Cautioned on safe driving practices.

## 2022-07-15 NOTE — Patient Instructions (Addendum)
Continue Trelegy 1 puff daily.  Brush tongue and rinse mouth afterwards Continue Albuterol inhaler 2 puffs or 3 mL neb every 6 hours as needed for shortness of breath or wheezing. Notify if symptoms persist despite rescue inhaler/neb use.  Use neb twice daily prior to Trelegy. We will send orders for a new nebulizer machine Continue CPAP nightly with 2 lpm supplemental O2, minimum 4 to 6 hours Continue Mucinex 1 tab Twice daily for chest congestion/cough  Start roflumilast (daliresp) 250 mcg (1/2 tab) daily for 4 weeks then increase to maintenance dose of 500 mcg (1 tab) daily. Recheck liver function in 4 weeks   We will obtain repeat chest x ray in 4 weeks. If your cough returns or your breathing gets worse, please call me so we can repeat this sooner   Follow up in 4 weeks with Dr. Annamaria Boots or Alanson Aly. If symptoms do not improve or worsen, please contact office for sooner follow up or seek emergency care.
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
## 2022-07-15 NOTE — Assessment & Plan Note (Addendum)
Slow to resolve AECOPD; he is clinically improved today. He does struggle with chronic bronchitis; given this and his recent exacerbations, recommended we start him on daliresp. Reviewed risks/benefits including hepatotoxicity. He does have a history of hepatitis C, which he was treated for. Recently had lab work 05/2022 at his PCP office; ALT normal at 36, AST slightly elevated at 48, bili normal at 0.6. Shared decision to start therapy. We will closely monitor his liver function moving forward. Continue triple therapy with Trelegy. He will continue to use his nebs twice daily - orders sent for new machine.   Patient Instructions  Continue Trelegy 1 puff daily.  Brush tongue and rinse mouth afterwards Continue Albuterol inhaler 2 puffs or 3 mL neb every 6 hours as needed for shortness of breath or wheezing. Notify if symptoms persist despite rescue inhaler/neb use.  Use neb twice daily prior to Trelegy. We will send orders for a new nebulizer machine Continue CPAP nightly with 2 lpm supplemental O2, minimum 4 to 6 hours Continue Mucinex 1 tab Twice daily for chest congestion/cough  Start roflumilast (daliresp) 250 mcg (1/2 tab) daily for 4 weeks then increase to maintenance dose of 500 mcg (1 tab) daily. Recheck liver function in 4 weeks   We will obtain repeat chest x ray in 4 weeks. If your cough returns or your breathing gets worse, please call me so we can repeat this sooner   Follow up in 4 weeks with Dr. Annamaria Boots or Alanson Aly. If symptoms do not improve or worsen, please contact office for sooner follow up or seek emergency care.

## 2022-07-15 NOTE — Assessment & Plan Note (Signed)
RML opacity on imaging from 10/5, consistent with pneumonia. Treated with 7 day course of augmentin. Infectious symptoms have improved. Repeat CXR in 4 weeks for 6 week follow up.

## 2022-07-22 ENCOUNTER — Encounter: Payer: Self-pay | Admitting: Internal Medicine

## 2022-08-04 ENCOUNTER — Encounter (INDEPENDENT_AMBULATORY_CARE_PROVIDER_SITE_OTHER): Payer: PRIVATE HEALTH INSURANCE | Admitting: Ophthalmology

## 2022-08-12 ENCOUNTER — Ambulatory Visit (INDEPENDENT_AMBULATORY_CARE_PROVIDER_SITE_OTHER): Payer: Medicare Other | Admitting: Nurse Practitioner

## 2022-08-12 ENCOUNTER — Ambulatory Visit (INDEPENDENT_AMBULATORY_CARE_PROVIDER_SITE_OTHER): Payer: Medicare Other

## 2022-08-12 ENCOUNTER — Encounter: Payer: Self-pay | Admitting: Nurse Practitioner

## 2022-08-12 VITALS — BP 134/82 | HR 90 | Ht 73.0 in | Wt 239.0 lb

## 2022-08-12 DIAGNOSIS — J189 Pneumonia, unspecified organism: Secondary | ICD-10-CM

## 2022-08-12 DIAGNOSIS — G4733 Obstructive sleep apnea (adult) (pediatric): Secondary | ICD-10-CM

## 2022-08-12 DIAGNOSIS — J439 Emphysema, unspecified: Secondary | ICD-10-CM | POA: Diagnosis not present

## 2022-08-12 DIAGNOSIS — J4489 Other specified chronic obstructive pulmonary disease: Secondary | ICD-10-CM

## 2022-08-12 MED ORDER — PREDNISONE 10 MG PO TABS: 10.0000 mg | ORAL_TABLET | Freq: Every day | ORAL | 1 refills | Status: DC

## 2022-08-12 NOTE — Assessment & Plan Note (Addendum)
Continues to receive good benefit from use. He will bring SD card at next visit to assess download. Cautioned on safe driving practices.

## 2022-08-12 NOTE — Patient Instructions (Addendum)
Continue Trelegy 1 puff daily. Step up to Trelegy 200 dose. Brush tongue and rinse mouth afterwards Continue Albuterol inhaler 2 puffs or 3 mL neb every 6 hours as needed for shortness of breath or wheezing. Notify if symptoms persist despite rescue inhaler/neb use.  Use neb twice daily prior to Trelegy. We will send orders for a new nebulizer machine Continue CPAP nightly with 2 lpm supplemental O2, minimum 4 to 6 hours Continue Mucinex 1 tab Twice daily for chest congestion/cough  Start prednisone 10 mg daily for the next four weeks. Take in AM with food  Repeat Chest x ray today to make sure the pneumonia has cleared up   Follow up in 4 weeks with Dr. Maple Hudson (1st) or Katie Rost,NP. If symptoms do not improve or worsen, please contact office for sooner follow up or seek emergency care.
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
## 2022-08-12 NOTE — Assessment & Plan Note (Signed)
Moderate COPD with high symptom burden. Frequent exacerbations. Tried on daliresp; unable to tolerate. He responds well to steroids. We will trial him on low dose daily prednisone. Discussed risks/benefits. He will take calcium and vit d daily for osteoporosis prevention. Continue on triple therapy regimen.   Patient Instructions  Continue Trelegy 1 puff daily. Step up to Trelegy 200 dose. Brush tongue and rinse mouth afterwards Continue Albuterol inhaler 2 puffs or 3 mL neb every 6 hours as needed for shortness of breath or wheezing. Notify if symptoms persist despite rescue inhaler/neb use.  Use neb twice daily prior to Trelegy. We will send orders for a new nebulizer machine Continue CPAP nightly with 2 lpm supplemental O2, minimum 4 to 6 hours Continue Mucinex 1 tab Twice daily for chest congestion/cough  Start prednisone 10 mg daily for the next four weeks. Take in AM with food  Repeat Chest x ray today to make sure the pneumonia has cleared up   Follow up in 4 weeks with Dr. Maple Hudson (1st) or Katie Enns,NP. If symptoms do not improve or worsen, please contact office for sooner follow up or seek emergency care.

## 2022-08-12 NOTE — Assessment & Plan Note (Signed)
RML pneumonia 10/5; completed 7 day course of augmentin 10/12. We will repeat imaging today for 6 week follow up to ensure resolution.

## 2022-08-19 ENCOUNTER — Encounter: Payer: Self-pay | Admitting: Internal Medicine

## 2022-08-19 NOTE — Telephone Encounter (Signed)
Suggest reducing prednisone dose to 5 mg (1/2 of a 10 mg tab) once daily. See if that keeps breathing stable enough without the vision problems. Let us know how this does after a week or so.

## 2022-08-22 ENCOUNTER — Telehealth: Payer: Self-pay | Admitting: Internal Medicine

## 2022-08-23 ENCOUNTER — Encounter: Payer: Self-pay | Admitting: Internal Medicine

## 2022-08-23 MED ORDER — VENTOLIN HFA 108 (90 BASE) MCG/ACT IN AERS: INHALATION_SPRAY | RESPIRATORY_TRACT | 12 refills | Status: DC

## 2022-08-23 NOTE — Telephone Encounter (Signed)
Albuterol hfa refilled 
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
## 2022-09-13 ENCOUNTER — Telehealth: Payer: Self-pay | Admitting: Internal Medicine

## 2022-09-13 ENCOUNTER — Ambulatory Visit (INDEPENDENT_AMBULATORY_CARE_PROVIDER_SITE_OTHER): Payer: Medicare Other | Admitting: Internal Medicine

## 2022-09-13 ENCOUNTER — Encounter: Payer: Self-pay | Admitting: Internal Medicine

## 2022-09-13 VITALS — BP 130/88 | HR 95 | Ht 74.0 in | Wt 240.6 lb

## 2022-09-13 DIAGNOSIS — J9611 Chronic respiratory failure with hypoxia: Secondary | ICD-10-CM

## 2022-09-13 DIAGNOSIS — I2510 Atherosclerotic heart disease of native coronary artery without angina pectoris: Secondary | ICD-10-CM | POA: Diagnosis not present

## 2022-09-13 DIAGNOSIS — G4733 Obstructive sleep apnea (adult) (pediatric): Secondary | ICD-10-CM

## 2022-09-13 DIAGNOSIS — J449 Chronic obstructive pulmonary disease, unspecified: Secondary | ICD-10-CM | POA: Diagnosis not present

## 2022-09-13 MED ORDER — THEOPHYLLINE ER 200 MG PO TB12: ORAL_TABLET | ORAL | 3 refills | Status: DC

## 2022-09-13 NOTE — Telephone Encounter (Signed)
Dr. Maple Hudson, please advise on the message from pt's pharmacy.   No Known Allergies   Current Outpatient Medications:    albuterol (PROVENTIL) (2.5 MG/3ML) 0.083% nebulizer solution, Take 3 mLs (2.5 mg total) by nebulization every 6 (six) hours as needed for wheezing or shortness of breath., Disp: 360 mL, Rfl: 12   aspirin EC 81 MG tablet, Take 1 tablet by mouth., Disp: , Rfl:    atorvastatin (LIPITOR) 40 MG tablet, TAKE 1 TABLET BY MOUTH ONCE DAILY, Disp: 90 tablet, Rfl: 1   cholecalciferol (VITAMIN D) 25 MCG tablet, Place 2 tablets (2,000 Units total) into feeding tube daily., Disp: , Rfl:    Fluticasone-Umeclidin-Vilant (TRELEGY ELLIPTA) 100-62.5-25 MCG/ACT AEPB, INHALE 1 PUFF INTO THE LUNGS EVERY DAY AT THE SAME TIME EACH DAY, Disp: 60 each, Rfl: 12   folic acid (FOLVITE) 1 MG tablet, Place 1 tablet (1 mg total) into feeding tube daily., Disp: , Rfl:    ibuprofen (ADVIL) 600 MG tablet, Place 1 tablet (600 mg total) into feeding tube every 6 (six) hours as needed for moderate pain., Disp: 30 tablet, Rfl: 0   lisinopril (ZESTRIL) 5 MG tablet, Take 5 mg by mouth daily., Disp: , Rfl:    METOPROLOL TARTRATE PO, Take 25 mg by mouth., Disp: , Rfl:    Multiple Vitamin (MULTIVITAMIN WITH MINERALS) TABS tablet, Place 1 tablet into feeding tube daily., Disp: , Rfl:    pantoprazole (PROTONIX) 20 MG tablet, Take 1 tablet by mouth., Disp: , Rfl:    predniSONE (DELTASONE) 10 MG tablet, Take 1 tablet (10 mg total) by mouth daily with breakfast., Disp: 30 tablet, Rfl: 1   roflumilast (DALIRESP) 500 MCG TABS tablet, Take 0.5 tablets (250 mcg total) by mouth daily for 28 days, THEN 1 tablet (500 mcg total) daily., Disp: 30 tablet, Rfl: 11   tamsulosin (FLOMAX) 0.4 MG CAPS capsule, Take 1 capsule by mouth., Disp: , Rfl:    theophylline (THEODUR) 200 MG 12 hr tablet, 1 tab two times daily with food, Disp: 60 tablet, Rfl: 3   VENTOLIN HFA 108 (90 Base) MCG/ACT inhaler, Inhale 2 puffs every 4-6 hours- rescue, Disp:  18 g, Rfl: 12  Current Facility-Administered Medications:    albuterol (PROVENTIL) (2.5 MG/3ML) 0.083% nebulizer solution 2.5 mg, 2.5 mg, Nebulization, Once, Loconte, Ruby Cola, NP

## 2022-09-13 NOTE — Telephone Encounter (Signed)
Called and spoke with pharmacist Thayer Ohm letting him know the verbal from Dr. Maple Hudson about pt's Theodur and he verbalized understanding stating that he would get the Rx ready for pt. Nothing further needed.

## 2022-09-13 NOTE — Telephone Encounter (Signed)
Ok to switch to Theodur 400, # 30. If this can be broken, then take 1/2 tab twice daily with food. If it cannot be broken, then take 1 each morning with food.

## 2022-09-13 NOTE — Patient Instructions (Addendum)
Order- O2 qualifying walk test on room air- done  Try cutting back on prednisone to 5mg  every other day  Script sent to try theophyline- they may call it Theodur or Uniphyll.

## 2022-09-28 ENCOUNTER — Encounter: Payer: Self-pay | Admitting: Internal Medicine

## 2022-09-28 NOTE — Assessment & Plan Note (Addendum)
Ordering portable concentrator 2L-3L pulse.

## 2022-09-28 NOTE — Assessment & Plan Note (Signed)
He continues to feel better with CPAP and reports no problems. Plan-continue CPAP 10 with oxygen 2 L.  When machine is replaced anticipate changing to AutoPap.

## 2022-09-28 NOTE — Progress Notes (Deleted)
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 Despite repetitive coaching he did not generate acceptable curves.  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 After 2 laps sat 88%, HR 118. With POC RA 87%, on 2L pulse sat 93%. ----------------------------------------------------------------------------------------------------.   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 Mainly for sleep.   Can't take O2 to his very physical job CPAP 10/ [facility] with O2 2L   Download compliance- has new card but hasn't installed in his machine Body weight today- 240 lbs LOV 08/12/22- [place], NP CAP RML. Dyspnea gradually worse.Retired but back working a Designer, multimedia. Wears O2 through CPAP at night. Walk test 09/13/22- room air baseline O2 sat 93%/ HR 102. After 2 laps sat 88%, HR 118. With POC RA 87%, on 2L pulse sat 93%. We will recheck CXR after pneumonia, and contact [facility] about POC on 2L pulse. CXR 08/12/22-  IMPRESSION: Slight interval improvement of the previously described right lower lung opacity, likely resolving pneumonia. Recommend 1 additional follow-up chest radiograph in 6-8 weeks to ensure  complete resolution.  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 Mainly for sleep.   Can't take O2 to his very physical job CPAP 10/ [facility] with O2 2L   Download compliance-  Body weight today-  Covid vax- Flu vax-   ROS-see HPI + = positive Constitutional:   No-   weight loss, night sweats, fevers, chills, fatigue, lassitude. HEENT:   No-  headaches, difficulty swallowing, tooth/dental problems, sore throat,       No-  sneezing, itching, ear ache, nasal congestion, post nasal drip,  CV:  No-   chest pain, orthopnea, PND, swelling in lower extremities, anasarca,                   dizziness, palpitations Resp: +  shortness of breath with exertion or at rest.               productive cough,  + non-productive cough,  No- coughing up of blood.          change in color of mucus.  No- wheezing.  +snores Skin: No-   rash or lesions. GI:  No-   heartburn, indigestion, abdominal pain, nausea, vomiting,  GU:  MS:  No-   joint pain or swelling.  . Neuro-     nothing unusual Psych:  No- change in mood or affect. No depression or anxiety.  No memory loss.  OBJ- Physical Exam General- Alert, Oriented, Affect-appropriate, Distress- none acute Skin-  Lymphadenopathy- none Head- atraumatic  Eyes- Gross vision intact, PERRLA, conjunctivae and secretions clear            Ears- Hearing, canals-normal            Nose- Clear, no-Septal dev, mucus, polyps, erosion, perforation             Throat- Mallampati III-IV , mucosa clear , drainage- none, tonsils- atrophic Neck- flexible , trachea midline, no stridor , thyroid nl, carotid no bruit Chest - symmetrical excursion , unlabored           Heart/CV- RRR , no  murmur , no gallop  , no rub, nl s1 s2                           - JVD- none , edema- none, stasis changes- none, varices- none           Lung-  distant, wheeze- none, cough+congested, dullness-none, rub- none           Chest wall-  Abd- tender-no, distended-no, bowel sounds-present, HSM- no Br/ Gen/ Rectal- Not done, not indicated Extrem- cyanosis- none, clubbing, none, atrophy- none, strength- nl,             Neuro-+ tremor hands and mild head-bob

## 2022-09-30 ENCOUNTER — Ambulatory Visit: Payer: Self-pay | Admitting: Internal Medicine

## 2022-10-02 ENCOUNTER — Inpatient Hospital Stay (HOSPITAL_COMMUNITY)
Admission: EM | Admit: 2022-10-02 | Discharge: 2022-10-14 | DRG: 551 | Disposition: A | Discharge status: Skilled Nursing Facility | Payer: Medicare Other | Attending: Internal Medicine | Admitting: Internal Medicine

## 2022-10-02 ENCOUNTER — Encounter (HOSPITAL_COMMUNITY): Payer: Self-pay

## 2022-10-02 ENCOUNTER — Emergency Department (HOSPITAL_COMMUNITY): Payer: Medicare Other

## 2022-10-02 DIAGNOSIS — F10939 Alcohol use, unspecified with withdrawal, unspecified: Secondary | ICD-10-CM | POA: Insufficient documentation

## 2022-10-02 DIAGNOSIS — J9811 Atelectasis: Secondary | ICD-10-CM | POA: Diagnosis not present

## 2022-10-02 DIAGNOSIS — I10 Essential (primary) hypertension: Secondary | ICD-10-CM | POA: Diagnosis present

## 2022-10-02 DIAGNOSIS — Z8619 Personal history of other infectious and parasitic diseases: Secondary | ICD-10-CM

## 2022-10-02 DIAGNOSIS — W19XXXA Unspecified fall, initial encounter: Principal | ICD-10-CM

## 2022-10-02 DIAGNOSIS — H3530 Unspecified macular degeneration: Secondary | ICD-10-CM | POA: Diagnosis present

## 2022-10-02 DIAGNOSIS — Z79899 Other long term (current) drug therapy: Secondary | ICD-10-CM

## 2022-10-02 DIAGNOSIS — S32010A Wedge compression fracture of first lumbar vertebra, initial encounter for closed fracture: Secondary | ICD-10-CM | POA: Diagnosis present

## 2022-10-02 DIAGNOSIS — S32018A Other fracture of first lumbar vertebra, initial encounter for closed fracture: Secondary | ICD-10-CM

## 2022-10-02 DIAGNOSIS — Z9981 Dependence on supplemental oxygen: Secondary | ICD-10-CM

## 2022-10-02 DIAGNOSIS — R339 Retention of urine, unspecified: Secondary | ICD-10-CM | POA: Diagnosis not present

## 2022-10-02 DIAGNOSIS — E782 Mixed hyperlipidemia: Secondary | ICD-10-CM

## 2022-10-02 DIAGNOSIS — S32019A Unspecified fracture of first lumbar vertebra, initial encounter for closed fracture: Principal | ICD-10-CM

## 2022-10-02 DIAGNOSIS — Z7951 Long term (current) use of inhaled steroids: Secondary | ICD-10-CM

## 2022-10-02 DIAGNOSIS — F1010 Alcohol abuse, uncomplicated: Secondary | ICD-10-CM

## 2022-10-02 DIAGNOSIS — E119 Type 2 diabetes mellitus without complications: Secondary | ICD-10-CM | POA: Diagnosis present

## 2022-10-02 DIAGNOSIS — F10139 Alcohol abuse with withdrawal, unspecified: Secondary | ICD-10-CM | POA: Diagnosis not present

## 2022-10-02 DIAGNOSIS — E871 Hypo-osmolality and hyponatremia: Secondary | ICD-10-CM | POA: Diagnosis not present

## 2022-10-02 DIAGNOSIS — D696 Thrombocytopenia, unspecified: Secondary | ICD-10-CM | POA: Diagnosis present

## 2022-10-02 DIAGNOSIS — Z789 Other specified health status: Secondary | ICD-10-CM | POA: Diagnosis not present

## 2022-10-02 DIAGNOSIS — E8770 Fluid overload, unspecified: Secondary | ICD-10-CM | POA: Diagnosis not present

## 2022-10-02 DIAGNOSIS — Z7952 Long term (current) use of systemic steroids: Secondary | ICD-10-CM

## 2022-10-02 DIAGNOSIS — J4489 Other specified chronic obstructive pulmonary disease: Secondary | ICD-10-CM

## 2022-10-02 DIAGNOSIS — J439 Emphysema, unspecified: Secondary | ICD-10-CM | POA: Diagnosis present

## 2022-10-02 DIAGNOSIS — Z7982 Long term (current) use of aspirin: Secondary | ICD-10-CM

## 2022-10-02 DIAGNOSIS — K219 Gastro-esophageal reflux disease without esophagitis: Secondary | ICD-10-CM | POA: Diagnosis present

## 2022-10-02 DIAGNOSIS — Z96612 Presence of left artificial shoulder joint: Secondary | ICD-10-CM | POA: Diagnosis present

## 2022-10-02 DIAGNOSIS — E785 Hyperlipidemia, unspecified: Secondary | ICD-10-CM | POA: Diagnosis present

## 2022-10-02 DIAGNOSIS — E876 Hypokalemia: Secondary | ICD-10-CM | POA: Diagnosis not present

## 2022-10-02 DIAGNOSIS — Z6829 Body mass index (BMI) 29.0-29.9, adult: Secondary | ICD-10-CM

## 2022-10-02 DIAGNOSIS — G9340 Encephalopathy, unspecified: Secondary | ICD-10-CM

## 2022-10-02 DIAGNOSIS — F1090 Alcohol use, unspecified, uncomplicated: Secondary | ICD-10-CM

## 2022-10-02 DIAGNOSIS — I2510 Atherosclerotic heart disease of native coronary artery without angina pectoris: Secondary | ICD-10-CM | POA: Diagnosis present

## 2022-10-02 DIAGNOSIS — J9611 Chronic respiratory failure with hypoxia: Secondary | ICD-10-CM | POA: Diagnosis not present

## 2022-10-02 DIAGNOSIS — G8929 Other chronic pain: Secondary | ICD-10-CM | POA: Diagnosis present

## 2022-10-02 DIAGNOSIS — W010XXA Fall on same level from slipping, tripping and stumbling without subsequent striking against object, initial encounter: Secondary | ICD-10-CM | POA: Diagnosis present

## 2022-10-02 DIAGNOSIS — T4275XA Adverse effect of unspecified antiepileptic and sedative-hypnotic drugs, initial encounter: Secondary | ICD-10-CM | POA: Diagnosis not present

## 2022-10-02 DIAGNOSIS — E8809 Other disorders of plasma-protein metabolism, not elsewhere classified: Secondary | ICD-10-CM | POA: Diagnosis present

## 2022-10-02 DIAGNOSIS — G928 Other toxic encephalopathy: Secondary | ICD-10-CM | POA: Diagnosis not present

## 2022-10-02 DIAGNOSIS — J9621 Acute and chronic respiratory failure with hypoxia: Secondary | ICD-10-CM | POA: Diagnosis present

## 2022-10-02 DIAGNOSIS — Z751 Person awaiting admission to adequate facility elsewhere: Secondary | ICD-10-CM

## 2022-10-02 DIAGNOSIS — G4733 Obstructive sleep apnea (adult) (pediatric): Secondary | ICD-10-CM

## 2022-10-02 DIAGNOSIS — Z7401 Bed confinement status: Secondary | ICD-10-CM

## 2022-10-02 DIAGNOSIS — R338 Other retention of urine: Secondary | ICD-10-CM

## 2022-10-02 DIAGNOSIS — Z87891 Personal history of nicotine dependence: Secondary | ICD-10-CM

## 2022-10-02 DIAGNOSIS — Z8249 Family history of ischemic heart disease and other diseases of the circulatory system: Secondary | ICD-10-CM

## 2022-10-02 DIAGNOSIS — E669 Obesity, unspecified: Secondary | ICD-10-CM | POA: Diagnosis present

## 2022-10-02 LAB — CBC WITH DIFFERENTIAL/PLATELET
Abs Immature Granulocytes: 0.03 10*3/uL (ref 0.00–0.07)
Basophils Absolute: 0 10*3/uL (ref 0.0–0.1)
Basophils Relative: 0 %
Eosinophils Absolute: 0 10*3/uL (ref 0.0–0.5)
Eosinophils Relative: 0 %
HCT: 42.4 % (ref 39.0–52.0)
Hemoglobin: 14.7 g/dL (ref 13.0–17.0)
Immature Granulocytes: 0 %
Lymphocytes Relative: 8 %
Lymphs Abs: 0.7 10*3/uL (ref 0.7–4.0)
MCH: 34.3 pg — ABNORMAL HIGH (ref 26.0–34.0)
MCHC: 34.7 g/dL (ref 30.0–36.0)
MCV: 98.8 fL (ref 80.0–100.0)
Monocytes Absolute: 0.7 10*3/uL (ref 0.1–1.0)
Monocytes Relative: 9 %
Neutro Abs: 6.3 10*3/uL (ref 1.7–7.7)
Neutrophils Relative %: 83 %
Platelets: 162 10*3/uL (ref 150–400)
RBC: 4.29 MIL/uL (ref 4.22–5.81)
RDW: 12.8 % (ref 11.5–15.5)
WBC: 7.7 10*3/uL (ref 4.0–10.5)
nRBC: 0 % (ref 0.0–0.2)

## 2022-10-02 LAB — COMPREHENSIVE METABOLIC PANEL
ALT: 38 U/L (ref 0–44)
AST: 44 U/L — ABNORMAL HIGH (ref 15–41)
Albumin: 4.3 g/dL (ref 3.5–5.0)
Alkaline Phosphatase: 110 U/L (ref 38–126)
Anion gap: 11 (ref 5–15)
BUN: 9 mg/dL (ref 8–23)
CO2: 22 mmol/L (ref 22–32)
Calcium: 9.3 mg/dL (ref 8.9–10.3)
Chloride: 103 mmol/L (ref 98–111)
Creatinine, Ser: 0.72 mg/dL (ref 0.61–1.24)
GFR, Estimated: >60 mL/min (ref >60)
Glucose, Bld: 99 mg/dL (ref 70–99)
Potassium: 3.9 mmol/L (ref 3.5–5.1)
Sodium: 136 mmol/L (ref 135–145)
Total Bilirubin: 1.1 mg/dL (ref 0.3–1.2)
Total Protein: 7.5 g/dL (ref 6.5–8.1)

## 2022-10-02 LAB — CK: Total CK: 171 U/L (ref 49–397)

## 2022-10-02 LAB — URINALYSIS, ROUTINE W REFLEX MICROSCOPIC
Bilirubin Urine: NEGATIVE
Glucose, UA: NEGATIVE mg/dL
Hgb urine dipstick: NEGATIVE
Ketones, ur: 20 mg/dL — AB
Leukocytes,Ua: NEGATIVE
Nitrite: NEGATIVE
Protein, ur: 30 mg/dL — AB
Specific Gravity, Urine: 1.024 (ref 1.005–1.030)
pH: 6 (ref 5.0–8.0)

## 2022-10-02 MED ORDER — LIDOCAINE 5 % EX PTCH
1.0000 | MEDICATED_PATCH | Freq: Every day | CUTANEOUS | Status: DC
  Administered 2022-10-02 – 2022-10-13 (×12): 1 via TRANSDERMAL
  Filled 2022-10-02 (×12): qty 1

## 2022-10-02 MED ORDER — THIAMINE HCL 100 MG/ML IJ SOLN
100.0000 mg | Freq: Every day | INTRAMUSCULAR | Status: DC
  Filled 2022-10-02 (×3): qty 2

## 2022-10-02 MED ORDER — KETAMINE HCL 50 MG/5ML IJ SOSY
0.3000 mg/kg | PREFILLED_SYRINGE | Freq: Once | INTRAMUSCULAR | Status: AC
  Administered 2022-10-02: 25 mg via INTRAVENOUS
  Filled 2022-10-02: qty 5

## 2022-10-02 MED ORDER — ADULT MULTIVITAMIN W/MINERALS CH
1.0000 | ORAL_TABLET | Freq: Every day | ORAL | Status: DC
  Administered 2022-10-02 – 2022-10-14 (×13): 1 via ORAL
  Filled 2022-10-02 (×13): qty 1

## 2022-10-02 MED ORDER — MORPHINE SULFATE (PF) 4 MG/ML IV SOLN
8.0000 mg | Freq: Once | INTRAVENOUS | Status: AC
  Administered 2022-10-02: 8 mg via INTRAVENOUS
  Filled 2022-10-02: qty 2

## 2022-10-02 MED ORDER — KETOROLAC TROMETHAMINE 15 MG/ML IJ SOLN
15.0000 mg | Freq: Once | INTRAMUSCULAR | Status: AC
  Administered 2022-10-02: 15 mg via INTRAVENOUS
  Filled 2022-10-02: qty 1

## 2022-10-02 MED ORDER — SODIUM CHLORIDE 0.9 % IV SOLN: 1.0000 mg | Freq: Once | INTRAVENOUS | Status: DC

## 2022-10-02 MED ORDER — FOLIC ACID 1 MG PO TABS
1.0000 mg | ORAL_TABLET | Freq: Every day | ORAL | Status: DC
  Administered 2022-10-02 – 2022-10-14 (×13): 1 mg via ORAL
  Filled 2022-10-02 (×13): qty 1

## 2022-10-02 MED ORDER — IOHEXOL 350 MG/ML SOLN
100.0000 mL | Freq: Once | INTRAVENOUS | Status: AC | PRN
  Administered 2022-10-02: 100 mL via INTRAVENOUS

## 2022-10-02 MED ORDER — THEOPHYLLINE ER 400 MG PO TB24
400.0000 mg | ORAL_TABLET | Freq: Every day | ORAL | Status: DC
  Administered 2022-10-03 – 2022-10-14 (×12): 400 mg via ORAL
  Filled 2022-10-02 (×14): qty 1

## 2022-10-02 MED ORDER — METHOCARBAMOL 1000 MG/10ML IJ SOLN
1000.0000 mg | Freq: Once | INTRAMUSCULAR | Status: DC
  Filled 2022-10-02: qty 10

## 2022-10-02 MED ORDER — ATORVASTATIN CALCIUM 40 MG PO TABS
40.0000 mg | ORAL_TABLET | Freq: Every day | ORAL | Status: DC
  Administered 2022-10-03 – 2022-10-14 (×12): 40 mg via ORAL
  Filled 2022-10-02 (×12): qty 1

## 2022-10-02 MED ORDER — ROFLUMILAST 500 MCG PO TABS
500.0000 ug | ORAL_TABLET | Freq: Every day | ORAL | Status: DC
  Administered 2022-10-04 – 2022-10-14 (×11): 500 ug via ORAL
  Filled 2022-10-02 (×13): qty 1

## 2022-10-02 MED ORDER — METOPROLOL TARTRATE 5 MG/5ML IV SOLN
5.0000 mg | Freq: Once | INTRAVENOUS | Status: AC
  Administered 2022-10-02: 5 mg via INTRAVENOUS
  Filled 2022-10-02: qty 5

## 2022-10-02 MED ORDER — THIAMINE MONONITRATE 100 MG PO TABS
100.0000 mg | ORAL_TABLET | Freq: Every day | ORAL | Status: DC
  Administered 2022-10-02 – 2022-10-14 (×13): 100 mg via ORAL
  Filled 2022-10-02 (×13): qty 1

## 2022-10-02 MED ORDER — LACTATED RINGERS IV BOLUS
1000.0000 mL | Freq: Once | INTRAVENOUS | Status: AC
  Administered 2022-10-02: 1000 mL via INTRAVENOUS

## 2022-10-02 MED ORDER — LORAZEPAM 2 MG/ML IJ SOLN
1.0000 mg | INTRAMUSCULAR | Status: AC | PRN
  Administered 2022-10-03: 2 mg via INTRAVENOUS
  Administered 2022-10-03 – 2022-10-04 (×5): 4 mg via INTRAVENOUS
  Administered 2022-10-05: 1 mg via INTRAVENOUS
  Administered 2022-10-05: 4 mg via INTRAVENOUS
  Filled 2022-10-02 (×4): qty 2
  Filled 2022-10-02: qty 1
  Filled 2022-10-02: qty 2
  Filled 2022-10-02: qty 1
  Filled 2022-10-02 (×3): qty 2

## 2022-10-02 MED ORDER — LORAZEPAM 1 MG PO TABS
1.0000 mg | ORAL_TABLET | ORAL | Status: AC | PRN
  Administered 2022-10-03: 2 mg via ORAL
  Administered 2022-10-05: 1 mg via ORAL
  Filled 2022-10-02: qty 1
  Filled 2022-10-02: qty 2

## 2022-10-02 MED ORDER — OXYCODONE-ACETAMINOPHEN 5-325 MG PO TABS
1.0000 | ORAL_TABLET | ORAL | Status: DC | PRN
  Administered 2022-10-03 – 2022-10-11 (×22): 1 via ORAL
  Filled 2022-10-02 (×22): qty 1

## 2022-10-02 MED ORDER — ENOXAPARIN SODIUM 40 MG/0.4ML IJ SOSY
40.0000 mg | PREFILLED_SYRINGE | Freq: Every day | INTRAMUSCULAR | Status: DC
  Administered 2022-10-03 – 2022-10-14 (×12): 40 mg via SUBCUTANEOUS
  Filled 2022-10-02 (×12): qty 0.4

## 2022-10-02 MED ORDER — ALBUTEROL SULFATE (2.5 MG/3ML) 0.083% IN NEBU
2.5000 mg | INHALATION_SOLUTION | Freq: Four times a day (QID) | RESPIRATORY_TRACT | Status: DC | PRN
  Administered 2022-10-03 – 2022-10-08 (×3): 2.5 mg via RESPIRATORY_TRACT
  Filled 2022-10-02 (×3): qty 3

## 2022-10-02 MED ORDER — IBUPROFEN 400 MG PO TABS
800.0000 mg | ORAL_TABLET | Freq: Four times a day (QID) | ORAL | Status: DC | PRN
  Administered 2022-10-06 – 2022-10-07 (×3): 800 mg via ORAL
  Filled 2022-10-02 (×3): qty 4

## 2022-10-02 MED ORDER — LISINOPRIL 5 MG PO TABS
5.0000 mg | ORAL_TABLET | Freq: Every day | ORAL | Status: DC
  Administered 2022-10-03: 5 mg via ORAL
  Filled 2022-10-02: qty 1

## 2022-10-02 MED ORDER — HYDROMORPHONE HCL 1 MG/ML IJ SOLN
1.0000 mg | INTRAMUSCULAR | Status: DC | PRN
  Administered 2022-10-02 – 2022-10-09 (×7): 1 mg via INTRAVENOUS
  Filled 2022-10-02 (×7): qty 1

## 2022-10-02 MED ORDER — THIAMINE HCL 100 MG/ML IJ SOLN
100.0000 mg | Freq: Once | INTRAMUSCULAR | Status: DC
  Filled 2022-10-02: qty 2

## 2022-10-02 MED ORDER — OXYCODONE-ACETAMINOPHEN 5-325 MG PO TABS
1.5000 | ORAL_TABLET | Freq: Once | ORAL | Status: AC
  Administered 2022-10-02: 1.5 via ORAL
  Filled 2022-10-02: qty 2

## 2022-10-02 MED ORDER — THEOPHYLLINE ER 200 MG PO TB12
200.0000 mg | ORAL_TABLET | Freq: Two times a day (BID) | ORAL | Status: DC
  Filled 2022-10-02: qty 1

## 2022-10-02 MED ORDER — METHOCARBAMOL 1000 MG/10ML IJ SOLN
1000.0000 mg | Freq: Once | INTRAVENOUS | Status: AC
  Administered 2022-10-02: 1000 mg via INTRAVENOUS
  Filled 2022-10-02: qty 10

## 2022-10-02 MED ORDER — TAMSULOSIN HCL 0.4 MG PO CAPS
0.4000 mg | ORAL_CAPSULE | Freq: Every day | ORAL | Status: DC
  Administered 2022-10-03 – 2022-10-14 (×12): 0.4 mg via ORAL
  Filled 2022-10-02 (×12): qty 1
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
## 2022-10-02 NOTE — Assessment & Plan Note (Addendum)
-  elevated secondary to pain, continue pain control -Continue Lisinopril

## 2022-10-02 NOTE — Assessment & Plan Note (Signed)
-  secondary to COPD with nocturnal 2L O2 supplementation  -stable, continue baseline O2

## 2022-10-02 NOTE — Progress Notes (Signed)
Orthopedic Tech Progress Note Patient Details:  [name] Brewer 01-13-1956 [id number]  Patient ID: [name], male   DOB: Feb 28, 1956, 67 y.o.   MRN: [id number] I spoke the Dr about whether the patient needed the brace on while in bed. They said that the brace was to help with pain and the patient didn't need to have it on in bed unless they wanted to. I then spoke with the patient. They said they didn't want to wear it in bed unless they had to. I explained that we would wait till they got up to a room and needed to get up to fit them for the brace. I spoke with the RN and asked them to have the floor RN to call me when they wanted the brace to be fit. I will keep up with the patient and make sure to contact the floor RN if they don't contact me. Karolee Stamps 10/02/2022, 11:58 PM

## 2022-10-02 NOTE — Assessment & Plan Note (Signed)
-  Reports heavy alcohol use anywhere from 2-20 beers daily. Last drank about 4 beers yesterday. -He is tachycardic and hypertensive but suspect more contributory to his acute fracture  -keep on CIWA protocol

## 2022-10-02 NOTE — Assessment & Plan Note (Signed)
-  Secondary to mechanical fall. -TLSO brace per neurosurgery  - pain control with NSAIDS, lidocaine patch, PRN oxycodone-acetaminophen and IV Dilaudid. Pt appears to have a high opioid and pain tolerance. Received reportedly 245mcg of Fentanyl with EMS, Ketamine 10mg  x2 and 8mg  of morphine in ED and still had 10/10 pain. Elevated BP and tachycardic also reflects his report of pain.

## 2022-10-02 NOTE — Assessment & Plan Note (Signed)
-  continue statin

## 2022-10-02 NOTE — ED Triage Notes (Signed)
Pt tripped over Oxygen tubing last night around 0200. Pt crawled back into bed but pain in mid to lower back was so painful his girlfriend called EMS.  Pt was given 250 mcg Fentanyl.

## 2022-10-02 NOTE — ED Provider Notes (Signed)
[facility] EMERGENCY DEPARTMENT Provider Note   CSN: [id number] Arrival date & time: 10/02/22  1739     History {Add pertinent medical, surgical, social history, OB history to HPI:1} Chief Complaint  Patient presents with   [name] is a 67 y.o. male.  Past medical history of hypertension, hyperlipidemia, COPD, hepatitis C, alcohol use disorder, diabetes, macular degeneration, GERD, CAD, OSA, anxiety, alcohol use disorder.  Presenting to the emergency department today for a fall.  Last night around 2 or 3 AM he was going to the bathroom when he tripped over some oxygen tubing and experienced a fall.  Had severe mid to lower back pain, but called back into bed and tried to sleep it off.  Woke up this morning and was in excruciating pain, cannot get out of bed to go to the bathroom.  Try to get to the edge of the bed to urinate, but ultimately ended up urinating on himself.  States that he can feel that he needs to go, but just cannot get himself to go to the bathroom because of the pain.  Has full strength and sensation of his bilateral lower extremities.  Pain only in the mid back, no abdominal pain.  No chest pain or shortness of breath.  With EMS, was in significant amount of pain, was given a total of 250 mcg of fentanyl, but continuing to still have pain.   Fall       Home Medications Prior to Admission medications   Medication Sig Start Date End Date Taking? Authorizing Provider  albuterol (PROVENTIL) (2.5 MG/3ML) 0.083% nebulizer solution Take 3 mLs (2.5 mg total) by nebulization every 6 (six) hours as needed for wheezing or shortness of breath. 03/28/22   Baird Lyons D, MD  aspirin EC 81 MG tablet Take 1 tablet by mouth.    [provider]  atorvastatin (LIPITOR) 40 MG tablet TAKE 1 TABLET BY MOUTH ONCE DAILY 05/06/22   O'Neal, Cassie Freer, MD  cholecalciferol (VITAMIN D) 25 MCG tablet Place 2 tablets (2,000 Units total) into feeding  tube daily. 08/31/21   Meuth, Brooke A, PA-C  Fluticasone-Umeclidin-Vilant (TRELEGY ELLIPTA) 100-62.5-25 MCG/ACT AEPB INHALE 1 PUFF INTO THE LUNGS EVERY DAY AT THE SAME TIME EACH DAY 06/16/22   Baird Lyons D, MD  folic acid (FOLVITE) 1 MG tablet Place 1 tablet (1 mg total) into feeding tube daily. 08/31/21   Meuth, Brooke A, PA-C  ibuprofen (ADVIL) 600 MG tablet Place 1 tablet (600 mg total) into feeding tube every 6 (six) hours as needed for moderate pain. 08/30/21   Meuth, Brooke A, PA-C  lisinopril (ZESTRIL) 5 MG tablet Take 5 mg by mouth daily. 07/04/22   [provider]  METOPROLOL TARTRATE PO Take 25 mg by mouth.    [provider]  Multiple Vitamin (MULTIVITAMIN WITH MINERALS) TABS tablet Place 1 tablet into feeding tube daily. 08/31/21   Meuth, Brooke A, PA-C  pantoprazole (PROTONIX) 20 MG tablet Take 1 tablet by mouth.    [provider]  predniSONE (DELTASONE) 10 MG tablet Take 1 tablet (10 mg total) by mouth daily with breakfast. 08/12/22   Klemz, Karie Schwalbe, NP  roflumilast (DALIRESP) 500 MCG TABS tablet Take 0.5 tablets (250 mcg total) by mouth daily for 28 days, THEN 1 tablet (500 mcg total) daily. 07/15/22 08/12/23  Nienow, Karie Schwalbe, NP  tamsulosin (FLOMAX) 0.4 MG CAPS capsule Take 1 capsule by mouth. 03/18/22   [provider]  theophylline (THEODUR) 200 MG 12 hr tablet 1 tab two times daily with food 09/13/22   Deneise Lever, MD  VENTOLIN HFA 108 [id number] Base) MCG/ACT inhaler Inhale 2 puffs every 4-6 hours- rescue 08/23/22   Deneise Lever, MD      Allergies    Patient has no known allergies.    Review of Systems   Review of Systems  Physical Exam Updated Vital Signs There were no vitals taken for this visit. Physical Exam Physical Exam  Constitutional Nursing notes reviewed Vital signs reviewed  Head No obvious trauma No skull depressions or lacerations  ENT PERRL No conjunctival hemorrhage No periorbital ecchymoses, [place]  Eyes, or Battle Sign bilaterally Ears atraumatic No nasal septal deviation or hematoma Mouth and tongue atraumatic Trachea midline.  Neck No C spine stepoffs, deformities, or tenderness C collar in place  Chest Clavicles atraumatic Clavicles stable to anterior compression without crepitus Chest wall with symmetric expansion Chest wall stable to anterior and lateral compression without crepitus  Respiratory Effort normal CTAB No respiratory distress  CV Normal rate DP and radial pulses 2+ and equal bilaterally  Abdomen Soft Non-tender distended No peritonitis No abrasions/contusions  GU Atraumatic No gross blood  MSK Atraumatic No obvious deformity ROM appropriate Pelvis stable to lateral compression  Back T spine tender L spine tender No step offs or deformities   Skin Warm Dry  Neuro Awake and alert Moving all extremities GCS 15  ED Results / Procedures / Treatments   Labs (all labs ordered are listed, but only abnormal results are displayed) Labs Reviewed - No data to display  EKG None  Radiology No results found.  Procedures Procedures  {Document cardiac monitor, telemetry assessment procedure when appropriate:1}  Medications Ordered in ED Medications - No data to display  ED Course/ Medical Decision Making/ A&P   {   Click here for ABCD2, HEART and other calculatorsREFRESH Note before signing :1}                          Medical Decision Making Amount and/or Complexity of Data Reviewed Labs: ordered. Radiology: ordered.  Risk Prescription drug management.   [name] is a 67 y.o. male with significant PMH hypertension, hyperlipidemia, COPD, hepatitis C, alcohol use disorder, diabetes, macular degeneration, GERD, CAD, OSA, anxiety, alcohol use disorder who presented to the ED as a non-leveled trauma secondary to a fall at home, continued to have excruciating mid to lower back pain.  ABCs intact. GCS 15.  Afebrile,  hemodynamically stable. PE as below, notable for tender thoracic and lumbar spine, as well as tenderness to palpation in the left back around the flank.  Patient received a significant amount of fentanyl prior to arrival, continues to have severe amount of pain.  Given his significant cardiovascular risk factors and age, I do have concern for possible aortic dissection.  Will obtain CT aortogram with thoracic and lumbar spine reformats, give ketamine for pain, obtain labs because he was down for prolonged period of time.  significant findings of labs and imaging include ***.  ECG reviewed by myself which shows sinus tachycardia without any ST segment changes concerning for ischemia or electrolyte abnormality.    ***  The plan for this patient was discussed with Dr. Nechama Guard, who voiced agreement and who oversaw evaluation and treatment of this patient.    {Document critical care time when appropriate:1} {Document review of labs and clinical decision  tools ie heart score, Chads2Vasc2 etc:1}  {Document your independent review of radiology images, and any outside records:1} {Document your discussion with family members, caretakers, and with consultants:1} {Document social determinants of health affecting pt's care:1} {Document your decision making why or why not admission, treatments were needed:1} Final Clinical Impression(s) / ED Diagnoses Final diagnoses:  None    Rx / DC Orders ED Discharge Orders     None

## 2022-10-03 DIAGNOSIS — W010XXA Fall on same level from slipping, tripping and stumbling without subsequent striking against object, initial encounter: Secondary | ICD-10-CM | POA: Diagnosis present

## 2022-10-03 DIAGNOSIS — F1090 Alcohol use, unspecified, uncomplicated: Secondary | ICD-10-CM | POA: Diagnosis not present

## 2022-10-03 DIAGNOSIS — G8929 Other chronic pain: Secondary | ICD-10-CM | POA: Diagnosis present

## 2022-10-03 DIAGNOSIS — S32018A Other fracture of first lumbar vertebra, initial encounter for closed fracture: Secondary | ICD-10-CM | POA: Diagnosis not present

## 2022-10-03 DIAGNOSIS — Z96612 Presence of left artificial shoulder joint: Secondary | ICD-10-CM | POA: Diagnosis present

## 2022-10-03 DIAGNOSIS — E8809 Other disorders of plasma-protein metabolism, not elsewhere classified: Secondary | ICD-10-CM | POA: Diagnosis present

## 2022-10-03 DIAGNOSIS — T4275XA Adverse effect of unspecified antiepileptic and sedative-hypnotic drugs, initial encounter: Secondary | ICD-10-CM | POA: Diagnosis not present

## 2022-10-03 DIAGNOSIS — H3530 Unspecified macular degeneration: Secondary | ICD-10-CM | POA: Diagnosis present

## 2022-10-03 DIAGNOSIS — K219 Gastro-esophageal reflux disease without esophagitis: Secondary | ICD-10-CM | POA: Diagnosis present

## 2022-10-03 DIAGNOSIS — Z7401 Bed confinement status: Secondary | ICD-10-CM | POA: Diagnosis not present

## 2022-10-03 DIAGNOSIS — I10 Essential (primary) hypertension: Secondary | ICD-10-CM | POA: Diagnosis not present

## 2022-10-03 DIAGNOSIS — J9621 Acute and chronic respiratory failure with hypoxia: Secondary | ICD-10-CM | POA: Diagnosis not present

## 2022-10-03 DIAGNOSIS — D696 Thrombocytopenia, unspecified: Secondary | ICD-10-CM | POA: Diagnosis present

## 2022-10-03 DIAGNOSIS — S32019A Unspecified fracture of first lumbar vertebra, initial encounter for closed fracture: Secondary | ICD-10-CM | POA: Diagnosis present

## 2022-10-03 DIAGNOSIS — E785 Hyperlipidemia, unspecified: Secondary | ICD-10-CM | POA: Diagnosis present

## 2022-10-03 DIAGNOSIS — S32010A Wedge compression fracture of first lumbar vertebra, initial encounter for closed fracture: Secondary | ICD-10-CM | POA: Diagnosis not present

## 2022-10-03 DIAGNOSIS — J9611 Chronic respiratory failure with hypoxia: Secondary | ICD-10-CM | POA: Diagnosis not present

## 2022-10-03 DIAGNOSIS — Z789 Other specified health status: Secondary | ICD-10-CM | POA: Diagnosis not present

## 2022-10-03 DIAGNOSIS — J9811 Atelectasis: Secondary | ICD-10-CM | POA: Diagnosis not present

## 2022-10-03 DIAGNOSIS — G4733 Obstructive sleep apnea (adult) (pediatric): Secondary | ICD-10-CM | POA: Diagnosis present

## 2022-10-03 DIAGNOSIS — E119 Type 2 diabetes mellitus without complications: Secondary | ICD-10-CM | POA: Diagnosis present

## 2022-10-03 DIAGNOSIS — G928 Other toxic encephalopathy: Secondary | ICD-10-CM | POA: Diagnosis not present

## 2022-10-03 DIAGNOSIS — F10139 Alcohol abuse with withdrawal, unspecified: Secondary | ICD-10-CM | POA: Diagnosis not present

## 2022-10-03 DIAGNOSIS — I2510 Atherosclerotic heart disease of native coronary artery without angina pectoris: Secondary | ICD-10-CM | POA: Diagnosis present

## 2022-10-03 DIAGNOSIS — R0603 Acute respiratory distress: Secondary | ICD-10-CM | POA: Diagnosis not present

## 2022-10-03 DIAGNOSIS — J439 Emphysema, unspecified: Secondary | ICD-10-CM | POA: Diagnosis present

## 2022-10-03 DIAGNOSIS — I5031 Acute diastolic (congestive) heart failure: Secondary | ICD-10-CM | POA: Diagnosis not present

## 2022-10-03 DIAGNOSIS — Z6829 Body mass index (BMI) 29.0-29.9, adult: Secondary | ICD-10-CM | POA: Diagnosis not present

## 2022-10-03 DIAGNOSIS — F10931 Alcohol use, unspecified with withdrawal delirium: Secondary | ICD-10-CM | POA: Diagnosis not present

## 2022-10-03 DIAGNOSIS — E669 Obesity, unspecified: Secondary | ICD-10-CM | POA: Diagnosis present

## 2022-10-03 DIAGNOSIS — E8770 Fluid overload, unspecified: Secondary | ICD-10-CM | POA: Diagnosis not present

## 2022-10-03 DIAGNOSIS — R338 Other retention of urine: Secondary | ICD-10-CM | POA: Diagnosis not present

## 2022-10-03 DIAGNOSIS — E871 Hypo-osmolality and hyponatremia: Secondary | ICD-10-CM | POA: Diagnosis not present

## 2022-10-03 DIAGNOSIS — Z7952 Long term (current) use of systemic steroids: Secondary | ICD-10-CM | POA: Diagnosis not present

## 2022-10-03 LAB — CBC
HCT: 39.2 % (ref 39.0–52.0)
Hemoglobin: 13.5 g/dL (ref 13.0–17.0)
MCH: 34.4 pg — ABNORMAL HIGH (ref 26.0–34.0)
MCHC: 34.4 g/dL (ref 30.0–36.0)
MCV: 100 fL (ref 80.0–100.0)
Platelets: 130 10*3/uL — ABNORMAL LOW (ref 150–400)
RBC: 3.92 MIL/uL — ABNORMAL LOW (ref 4.22–5.81)
RDW: 12.9 % (ref 11.5–15.5)
WBC: 6 10*3/uL (ref 4.0–10.5)
nRBC: 0 % (ref 0.0–0.2)

## 2022-10-03 MED ORDER — SENNOSIDES-DOCUSATE SODIUM 8.6-50 MG PO TABS
1.0000 | ORAL_TABLET | Freq: Two times a day (BID) | ORAL | Status: DC
  Administered 2022-10-03 – 2022-10-14 (×23): 1 via ORAL
  Filled 2022-10-03 (×23): qty 1

## 2022-10-03 MED ORDER — GUAIFENESIN ER 600 MG PO TB12
600.0000 mg | ORAL_TABLET | Freq: Two times a day (BID) | ORAL | Status: AC
  Administered 2022-10-04: 600 mg via ORAL
  Filled 2022-10-03: qty 1

## 2022-10-03 MED ORDER — BISACODYL 10 MG RE SUPP: 10.0000 mg | Freq: Every day | RECTAL | Status: DC | PRN

## 2022-10-03 MED ORDER — METHOCARBAMOL 500 MG PO TABS
500.0000 mg | ORAL_TABLET | Freq: Four times a day (QID) | ORAL | Status: DC | PRN
  Administered 2022-10-03 – 2022-10-12 (×21): 500 mg via ORAL
  Filled 2022-10-03 (×23): qty 1

## 2022-10-03 MED ORDER — METOPROLOL TARTRATE 5 MG/5ML IV SOLN
2.5000 mg | Freq: Four times a day (QID) | INTRAVENOUS | Status: DC | PRN
  Administered 2022-10-03 – 2022-10-12 (×5): 2.5 mg via INTRAVENOUS
  Filled 2022-10-03 (×6): qty 5

## 2022-10-03 MED ORDER — POLYETHYLENE GLYCOL 3350 17 G PO PACK
17.0000 g | PACK | Freq: Every day | ORAL | Status: DC | PRN
  Administered 2022-10-04 – 2022-10-12 (×2): 17 g via ORAL
  Filled 2022-10-03 (×2): qty 1

## 2022-10-03 NOTE — Progress Notes (Signed)
PROGRESS NOTE    [name] [place]  WUJ:[id number] DOB: [birth year] DOA: 10/02/2022 PCP: Macarthur Critchley, MD   Brief Narrative:  67 y.o. male with medical history significant of COPD with chronic hypoxic respiratory failure on 2L nocturnal O2, OSA on CPAP, HTN, CAD, macular degeneration presented with a mechanical fall after he tripped over his oxygen tubing.  On presentation, he was found to have new L1 compression fracture.  Neurosurgery recommended conservative management with TLSO brace with outpatient follow-up.  He was admitted because of severe pain.  Assessment & Plan:   L1 vertebral compression fracture secondary to mechanical fall Intractable pain Chronic compression fractures of T1, T2 and T3/multiple old right rib fractures -Follow questions.  Still in significant pain.  Continue pain management.  Bowel regimen. - Neurosurgery recommended conservative management with TLSO brace with outpatient follow-up.  -PT eval  Alcohol use -Counseled regarding abstinence.  Currently on CIWA protocol.  Continue folic acid, multivitamin and thiamine  Hypertension -Continue lisinopril  Hyperlipidemia -Continue statin  COPD Chronic respiratory failure with hypoxia -Currently at baseline.  Continue nocturnal oxygen supplementation.  Continue nebs as needed   DVT prophylaxis: Lovenox Code Status: Full Family Communication: None at bedside Disposition Plan: Status is: Observation The patient will require care spanning > 2 midnights and should be moved to inpatient because: Still in significant pain.  PT eval pending    Consultants: EDP spoke to neurosurgery on-call on admission  Procedures: None  Antimicrobials: None   Subjective: Patient seen and examined at bedside.  Still complains of significant pain; has not gotten out of bed yet or work with physical therapy yet.  Does not feel ready to go home.  No fever, vomiting, chest pain reported.  Objective: Vitals:   10/03/22  0500 10/03/22 0601 10/03/22 0745 10/03/22 0830  BP: (!) 133/97  (!) 151/97 111/83  Pulse: 91  89 (!) 101  Resp: 17  (!) 21 (!) 24  Temp:  98.5 F (36.9 C)    TempSrc:      SpO2: 94%  96% 95%  Weight:      Height:        Intake/Output Summary (Last 24 hours) at 10/03/2022 1001 Last data filed at 10/02/2022 2331 Gross per 24 hour  Intake 1106.93 ml  Output 600 ml  Net 506.93 ml   Filed Weights   10/02/22 1800  Weight: 104.3 kg    Examination:  General exam: Appears calm and comfortable.  Looks chronically ill and deconditioned.  Intermittently requiring supplemental oxygen Respiratory system: Bilateral decreased breath sounds at bases with scattered crackle and intermittent tachypnea Cardiovascular system: S1 & S2 heard, mild intermittent tachycardia and Gastrointestinal system: Abdomen is nondistended, soft and nontender. Normal bowel sounds heard. Extremities: No cyanosis, clubbing, edema  Central nervous system: Alert and oriented.  Slow to respond.  No focal neurological deficits. Moving extremities Skin: No rashes, lesions or ulcers Psychiatry: Flat affect.  Not agitated.   Data Reviewed: I have personally reviewed following labs and imaging studies  CBC: Recent Labs  Lab 10/02/22 1807 10/03/22 0817  WBC 7.7 6.0  NEUTROABS 6.3  --   HGB 14.7 13.5  HCT 42.4 39.2  MCV 98.8 100.0  PLT 162 956*   Basic Metabolic Panel: Recent Labs  Lab 10/02/22 1807  NA 136  K 3.9  CL 103  CO2 22  GLUCOSE 99  BUN 9  CREATININE 0.72  CALCIUM 9.3   GFR: Estimated Creatinine Clearance: 116.9 mL/min (by C-G formula  based on SCr of 0.72 mg/dL). Liver Function Tests: Recent Labs  Lab 10/02/22 1807  AST 44*  ALT 38  ALKPHOS 110  BILITOT 1.1  PROT 7.5  ALBUMIN 4.3   No results for input(s): "LIPASE", "AMYLASE" in the last 168 hours. No results for input(s): "AMMONIA" in the last 168 hours. Coagulation Profile: No results for input(s): "INR", "PROTIME" in the last  168 hours. Cardiac Enzymes: Recent Labs  Lab 10/02/22 1807  CKTOTAL 171   BNP (last 3 results) No results for input(s): "PROBNP" in the last 8760 hours. HbA1C: No results for input(s): "HGBA1C" in the last 72 hours. CBG: No results for input(s): "GLUCAP" in the last 168 hours. Lipid Profile: No results for input(s): "CHOL", "HDL", "LDLCALC", "TRIG", "CHOLHDL", "LDLDIRECT" in the last 72 hours. Thyroid Function Tests: No results for input(s): "TSH", "T4TOTAL", "FREET4", "T3FREE", "THYROIDAB" in the last 72 hours. Anemia Panel: No results for input(s): "VITAMINB12", "FOLATE", "FERRITIN", "TIBC", "IRON", "RETICCTPCT" in the last 72 hours. Sepsis Labs: No results for input(s): "PROCALCITON", "LATICACIDVEN" in the last 168 hours.  No results found for this or any previous visit (from the past 240 hour(s)).       Radiology Studies: CT L-SPINE NO CHARGE  Result Date: 10/02/2022 CLINICAL DATA:  Recent fall with L1 compression fracture on CTA of the abdomen and pelvis EXAM: CT THORACIC SPINE WITHOUT CONTRAST; CT LUMBAR SPINE WITHOUT CONTRAST TECHNIQUE: Multidetector CT images of the thoracic and lumbar spine. Were obtained using the standard protocol without intravenous contrast. RADIATION DOSE REDUCTION: This exam was performed according to the departmental dose-optimization program which includes automated exposure control, adjustment of the mA and/or kV according to patient size and/or use of iterative reconstruction technique. COMPARISON:  02/02/2022 FINDINGS: CT of the thoracic spine: Alignment: Normal sick kyphosis is noted. Vertebrae: 12 thoracic vertebra are noted. Chronic compression fractures are noted at T1, T3 and T2 for. These are stable from prior CT from May of 2023. Multilevel osteophytic changes are noted. No other fractures are noted. Multiple right-sided rib fractures are noted with varying degrees of healing similar to that seen on recent CTA of the chest. Paraspinal and  other soft tissues: Paraspinal soft tissues are within normal limits. No focal hematoma is noted. Disc levels: No significant acute disc pathology is noted CT of lumbar spine: Alignment: Normal lordosis is noted. Vertebrae: 5 lumbar type vertebral bodies are well visualized. Vertebral body height is well maintained with the exception of an acute L1 compression fracture with approximately 30% height loss identified. No other compression fractures are seen. Changes of prior screw fixation of the sacroiliac joints are seen. Prior right iliac bone fracture adjacent to the SI joint is seen with callus formation consistent with healing. Paraspinal and other soft tissues: Paraspinal soft tissues are within normal limits. Disc levels: No specific disc pathology is noted. IMPRESSION: CT of the thoracic spine chronic compression deformities as described. Multiple old right rib fractures are seen. CT of the lumbar spine: Acute L1 compression fracture with 30% height loss identified. Changes of prior SI joint fixation and the right iliac bone fracture with healing are seen. Electronically Signed   By: Alcide Clever M.D.   On: 10/02/2022 21:31   CT T-SPINE NO CHARGE  Result Date: 10/02/2022 CLINICAL DATA:  Recent fall with L1 compression fracture on CTA of the abdomen and pelvis EXAM: CT THORACIC SPINE WITHOUT CONTRAST; CT LUMBAR SPINE WITHOUT CONTRAST TECHNIQUE: Multidetector CT images of the thoracic and lumbar spine. Were obtained using  the standard protocol without intravenous contrast. RADIATION DOSE REDUCTION: This exam was performed according to the departmental dose-optimization program which includes automated exposure control, adjustment of the mA and/or kV according to patient size and/or use of iterative reconstruction technique. COMPARISON:  02/02/2022 FINDINGS: CT of the thoracic spine: Alignment: Normal sick kyphosis is noted. Vertebrae: 12 thoracic vertebra are noted. Chronic compression fractures are noted at  T1, T3 and T2 for. These are stable from prior CT from May of 2023. Multilevel osteophytic changes are noted. No other fractures are noted. Multiple right-sided rib fractures are noted with varying degrees of healing similar to that seen on recent CTA of the chest. Paraspinal and other soft tissues: Paraspinal soft tissues are within normal limits. No focal hematoma is noted. Disc levels: No significant acute disc pathology is noted CT of lumbar spine: Alignment: Normal lordosis is noted. Vertebrae: 5 lumbar type vertebral bodies are well visualized. Vertebral body height is well maintained with the exception of an acute L1 compression fracture with approximately 30% height loss identified. No other compression fractures are seen. Changes of prior screw fixation of the sacroiliac joints are seen. Prior right iliac bone fracture adjacent to the SI joint is seen with callus formation consistent with healing. Paraspinal and other soft tissues: Paraspinal soft tissues are within normal limits. Disc levels: No specific disc pathology is noted. IMPRESSION: CT of the thoracic spine chronic compression deformities as described. Multiple old right rib fractures are seen. CT of the lumbar spine: Acute L1 compression fracture with 30% height loss identified. Changes of prior SI joint fixation and the right iliac bone fracture with healing are seen. Electronically Signed   By: Alcide Clever M.D.   On: 10/02/2022 21:31   CT Angio Chest/Abd/Pel for Dissection W and/or Wo Contrast  Result Date: 10/02/2022 CLINICAL DATA:  Recent trip and fall with back pain, initial encounter EXAM: CT ANGIOGRAPHY CHEST, ABDOMEN AND PELVIS TECHNIQUE: Non-contrast CT of the chest was initially obtained. Multidetector CT imaging through the chest, abdomen and pelvis was performed using the standard protocol during bolus administration of intravenous contrast. Multiplanar reconstructed images and MIPs were obtained and reviewed to evaluate the  vascular anatomy. RADIATION DOSE REDUCTION: This exam was performed according to the departmental dose-optimization program which includes automated exposure control, adjustment of the mA and/or kV according to patient size and/or use of iterative reconstruction technique. CONTRAST:  OMNIPAQUE IOHEXOL 350 MG/ML SOLN COMPARISON:  08/12/2022 FINDINGS: CTA CHEST FINDINGS Cardiovascular: Initial noncontrast images demonstrate no significant atherosclerotic calcifications of the thoracic aorta. Post-contrast images demonstrate a normal branching pattern of the thoracic aorta. No evidence of aneurysmal dilatation or dissection is seen. No cardiac enlargement is noted. Pulmonary artery as visualized is within normal limits. No significant coronary calcifications are seen. Mediastinum/Nodes: Thoracic inlet is within normal limits. No hilar or mediastinal adenopathy is noted. The esophagus is within normal limits. Lungs/Pleura: Lungs are well aerated bilaterally. Emphysematous changes are seen. No focal infiltrate or sizable effusion is seen. No pneumothorax is noted. Musculoskeletal: Multiple old rib fractures with healing are noted on the right posteriorly. No acute rib fracture is seen. Prior shoulder replacement on the left is noted. Multiple chronic appearing compression deformities are noted involving T1, T3 and T4. Additionally, an acute appearing L1 fracture is noted with proximally 30% vertebral body height loss. Review of the MIP images confirms the above findings. CTA ABDOMEN AND PELVIS FINDINGS VASCULAR Aorta: Atherosclerotic calcifications are noted without aneurysmal dilatation or dissection. Celiac: Patent without  evidence of aneurysm, dissection, vasculitis or significant stenosis. SMA: Patent without evidence of aneurysm, dissection, vasculitis or significant stenosis. Renals: Single renal artery is noted on right with dual renal arteries on the left. Mild atherosclerotic changes are noted. No focal  stenoses are seen. IMA: Patent without evidence of aneurysm, dissection, vasculitis or significant stenosis. Inflow: Iliacs show atherosclerotic calcification without focal abnormality. Veins: No specific venous abnormality is seen. Review of the MIP images confirms the above findings. NON-VASCULAR Hepatobiliary: No focal liver abnormality is seen. No gallstones, gallbladder wall thickening, or biliary dilatation. Pancreas: Unremarkable. No pancreatic ductal dilatation or surrounding inflammatory changes. Spleen: Normal in size without focal abnormality. Adrenals/Urinary Tract: Adrenal glands are within normal limits. Kidneys show a normal enhancement pattern bilaterally. Cysts are noted bilaterally the largest of which seen in the upper pole of the right kidney measuring 4.3 cm. These are simple in nature and no further follow-up is recommended. No renal calculi or obstructive changes are seen. The bladder is well distended. Stomach/Bowel: Scattered diverticular change of the colon is noted. No evidence of diverticulitis is seen. The appendix is air-filled and within normal limits. Small bowel and stomach are unremarkable. Lymphatic: No sizable adenopathy is noted. Reproductive: Prostate is unremarkable. Other: No abdominal wall hernia or abnormality. No abdominopelvic ascites. Musculoskeletal: Postsurgical changes are noted in the right hip. Prior pubic rami fractures with healing are noted on the right. 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 These changes are stable from the prior study. Prior screw fixation of the sacroiliac joints is seen. Fracture involving the right iliac bone adjacent to the sacroiliac joint is again seen with some callus formation consistent with. The known L1 compression fracture is seen. Again approximately 30% height loss is noted. Review of the MIP images confirms the above findings. IMPRESSION: CTA of the chest: No evidence of aortic dissection or pulmonary emboli.  Multiple old rib fractures are identified on the right. Additionally multiple chronic appearing compression deformities are noted throughout the thoracic spine as described. These are stable from prior CT from May of 2023. CTA of the abdomen and pelvis: No arterial abnormality is noted. Acute L1 compression fracture. 30% height loss is noted. Diverticulosis without diverticulitis. Chronic pubic rami fractures and right iliac bone fractures as described Electronically Signed   By: Inez Catalina M.D.   On: 10/02/2022 21:24        Scheduled Meds:  albuterol  2.5 mg Nebulization Once   atorvastatin  40 mg Oral Daily   enoxaparin (LOVENOX) injection  40 mg Subcutaneous Daily   folic acid  1 mg Oral Daily   lidocaine  1 patch Transdermal QHS   lisinopril  5 mg Oral Daily   multivitamin with minerals  1 tablet Oral Daily   roflumilast  500 mcg Oral Daily   tamsulosin  0.4 mg Oral Daily   theophylline  400 mg Oral Daily   thiamine  100 mg Oral Daily   Or   thiamine  100 mg Intravenous Daily   Continuous Infusions:        Aline August, MD [facility] 10/03/2022, 10:01 AM

## 2022-10-03 NOTE — Progress Notes (Signed)
Orthopedic Tech Progress Note Patient Details:  [name] [birth year] [id number]  Ortho Devices Type of Ortho Device: Thoracolumbar corset (TLSO) Ortho Device/Splint Interventions: Ordered      Danton Sewer A Kenniel Bergsma 10/03/2022, 6:57 PM

## 2022-10-03 NOTE — Progress Notes (Signed)
PT Cancellation Note  Patient Details Name: [name] MRN: [id number] DOB: Feb 21, 1956   Cancelled Treatment:    Reason Eval/Treat Not Completed: Medical issues which prohibited therapy. Pt awaiting brace fitting. PT to follow up tomorrow.   Zenaida Niece 10/03/2022, 5:32 PM

## 2022-10-03 NOTE — Progress Notes (Signed)
PT Cancellation Note  Patient Details Name: [name] MRN: [id number] DOB: [birth year]   Cancelled Treatment:    Reason Eval/Treat Not Completed: Medical issues which prohibited therapy. Per secure chat with RN, the pt is waiting to transfer to the floor prior to being fit for TLSO. PT will follow up for evaluation after brace has been received. If floor RN could alert this PT when brace is received it would be appreciated.   Zenaida Niece 10/03/2022, 8:13 AM

## 2022-10-03 NOTE — ED Notes (Signed)
ED TO INPATIENT HANDOFF REPORT  ED Nurse Name and Phone #: [phone] Carter Kitten Name/Age/Gender [name] 67 y.o. male Room/Bed: 002C/002C  Code Status   Code Status: Full Code  Home/SNF/Other Home Patient oriented to: self, place, time, and situation Is this baseline? Yes   Triage Complete: Triage complete  Chief Complaint Closed compression fracture of body of L1 vertebra (HCC) [S32.010A]  Triage Note Pt tripped over Oxygen tubing last night around 0200. Pt crawled back into bed but pain in mid to lower back was so painful his girlfriend called EMS.  Pt was given 250 mcg Fentanyl.     Allergies No Known Allergies  Level of Care/Admitting Diagnosis ED Disposition     ED Disposition  Admit   Condition  --   Comment  Hospital Area: [facility] [[id number]]  Level of Care: Telemetry Medical [104]  May place patient in observation at [facility] or [facility] if equivalent level of care is available:: No  Covid Evaluation: Asymptomatic - no recent exposure (last 10 days) testing not required  Diagnosis: Closed compression fracture of body of L1 vertebra [facility]) [[id number]]  Admitting Physician: Anselm Jungling [[id number]]  Attending Physician: Anselm Jungling [[id number]]          B Medical/Surgery History Past Medical History:  Diagnosis Date   COPD (chronic obstructive pulmonary disease) (HCC)    COPD (chronic obstructive pulmonary disease) (HCC)    GERD (gastroesophageal reflux disease)    History of hepatitis C    treated several years ago through [facility]   Hyperlipidemia    Hypertension    OSA (obstructive sleep apnea)    Past Surgical History:  Procedure Laterality Date   CATARACT EXTRACTION, BILATERAL     EYE SURGERY Bilateral    IR THORACENTESIS ASP PLEURAL SPACE W/IMG GUIDE  09/14/2021   IR THORACENTESIS ASP PLEURAL SPACE W/IMG GUIDE  09/21/2021   ORIF PELVIC FRACTURE WITH PERCUTANEOUS SCREWS Right 07/29/2021   Procedure: SI SCREW FIXATION  OF RIGHT PELVIC RING;  Surgeon: Myrene Galas, MD;  Location: [facility] OR;  Service: Orthopedics;  Laterality: Right;   ORTHOPEDIC SURGERY Bilateral    pt states fracture repairs to arms and legs.   REVERSE SHOULDER ARTHROPLASTY Left 09/23/2019   Procedure: REVERSE SHOULDER ARTHROPLASTY;  Surgeon: Jones Broom, MD;  Location: [facility] ORS;  Service: Orthopedics;  Laterality: Left;   TRACHEOSTOMY TUBE PLACEMENT N/A 08/10/2021   Procedure: TRACHEOSTOMY;  Surgeon: Violeta Gelinas, MD;  Location: [facility] OR;  Service: General;  Laterality: N/A;     A IV Location/Drains/Wounds Patient Lines/Drains/Airways Status     Active Line/Drains/Airways     Name Placement date Placement time Site Days   Peripheral IV 10/02/22 20 G 1" Left Antecubital 10/02/22  1726  Antecubital  1   Incision (Closed) 07/29/21 Hip Right 07/29/21  1146  -- 431   Wound / Incision (Open or Dehisced) 08/11/21 Skin tear Buttocks Left;Mid 08/11/21  0000  Buttocks  418   Wound / Incision (Open or Dehisced) 08/13/21 Skin tear Buttocks Right 08/13/21  0800  Buttocks  416            Intake/Output Last 24 hours  Intake/Output Summary (Last 24 hours) at 10/03/2022 1443 Last data filed at 10/02/2022 2331 Gross per 24 hour  Intake 1106.93 ml  Output 600 ml  Net 506.93 ml    Labs/Imaging Results for orders placed or performed during the hospital encounter of 10/02/22 (from the past 48  hour(s))  Comprehensive metabolic panel     Status: Abnormal   Collection Time: 10/02/22  6:07 PM  Result Value Ref Range   Sodium 136 135 - 145 mmol/L   Potassium 3.9 3.5 - 5.1 mmol/L   Chloride 103 98 - 111 mmol/L   CO2 22 22 - 32 mmol/L   Glucose, Bld 99 70 - 99 mg/dL    Comment: Glucose reference range applies only to samples taken after fasting for at least 8 hours.   BUN 9 8 - 23 mg/dL   Creatinine, Ser 0.72 0.61 - 1.24 mg/dL   Calcium 9.3 8.9 - 10.3 mg/dL   Total Protein 7.5 6.5 - 8.1 g/dL   Albumin 4.3 3.5 - 5.0 g/dL   AST 44 (H) 15 - 41  U/L   ALT 38 0 - 44 U/L   Alkaline Phosphatase 110 38 - 126 U/L   Total Bilirubin 1.1 0.3 - 1.2 mg/dL   GFR, Estimated >60 >60 mL/min    Comment: (NOTE) Calculated using the CKD-EPI Creatinine Equation (2021)    Anion gap 11 5 - 15    Comment: Performed at [facility] [address] [place], [place] [address]  CBC with Differential     Status: Abnormal   Collection Time: 10/02/22  6:07 PM  Result Value Ref Range   WBC 7.7 4.0 - 10.5 K/uL   RBC 4.29 4.22 - 5.81 MIL/uL   Hemoglobin 14.7 13.0 - 17.0 g/dL   HCT 42.4 39.0 - 52.0 %   MCV 98.8 80.0 - 100.0 fL   MCH 34.3 (H) 26.0 - 34.0 pg   MCHC 34.7 30.0 - 36.0 g/dL   RDW 12.8 11.5 - 15.5 %   Platelets 162 150 - 400 K/uL   nRBC 0.0 0.0 - 0.2 %   Neutrophils Relative % 83 %   Neutro Abs 6.3 1.7 - 7.7 K/uL   Lymphocytes Relative 8 %   Lymphs Abs 0.7 0.7 - 4.0 K/uL   Monocytes Relative 9 %   Monocytes Absolute 0.7 0.1 - 1.0 K/uL   Eosinophils Relative 0 %   Eosinophils Absolute 0.0 0.0 - 0.5 K/uL   Basophils Relative 0 %   Basophils Absolute 0.0 0.0 - 0.1 K/uL   Immature Granulocytes 0 %   Abs Immature Granulocytes 0.03 0.00 - 0.07 K/uL    Comment: Performed at [facility] [address] [place], [place] [address]  CK     Status: None   Collection Time: 10/02/22  6:07 PM  Result Value Ref Range   Total CK 171 49 - 397 U/L    Comment: Performed at [facility], [place] [address] [place], [place] [address]  Urinalysis, Routine w reflex microscopic Urine, Clean Catch     Status: Abnormal   Collection Time: 10/02/22  9:21 PM  Result Value Ref Range   Color, Urine YELLOW YELLOW   APPearance CLEAR CLEAR   Specific Gravity, Urine 1.024 1.005 - 1.030   pH 6.0 5.0 - 8.0   Glucose, UA NEGATIVE NEGATIVE mg/dL   Hgb urine dipstick NEGATIVE NEGATIVE   Bilirubin Urine NEGATIVE NEGATIVE   Ketones, ur 20 (A) NEGATIVE mg/dL   Protein, ur 30 (A) NEGATIVE mg/dL   Nitrite NEGATIVE NEGATIVE   Leukocytes,Ua NEGATIVE  NEGATIVE   RBC / HPF 0-5 0 - 5 RBC/hpf   WBC, UA 0-5 0 - 5 WBC/hpf   Bacteria, UA RARE (A) NONE SEEN   Squamous Epithelial /  HPF 0-5 0 - 5 /HPF   Mucus PRESENT    Hyaline Casts, UA PRESENT     Comment: Performed at [facility], [place] [address] [place], [place] [address]  CBC     Status: Abnormal   Collection Time: 10/03/22  8:17 AM  Result Value Ref Range   WBC 6.0 4.0 - 10.5 K/uL   RBC 3.92 (L) 4.22 - 5.81 MIL/uL   Hemoglobin 13.5 13.0 - 17.0 g/dL   HCT 39.2 39.0 - 52.0 %   MCV 100.0 80.0 - 100.0 fL   MCH 34.4 (H) 26.0 - 34.0 pg   MCHC 34.4 30.0 - 36.0 g/dL   RDW 12.9 11.5 - 15.5 %   Platelets 130 (L) 150 - 400 K/uL   nRBC 0.0 0.0 - 0.2 %    Comment: Performed at [facility], [place] [address] [place], [place] [address]   CT L-SPINE NO CHARGE  Result Date: 10/02/2022 CLINICAL DATA:  Recent fall with L1 compression fracture on CTA of the abdomen and pelvis EXAM: CT THORACIC SPINE WITHOUT CONTRAST; CT LUMBAR SPINE WITHOUT CONTRAST TECHNIQUE: Multidetector CT images of the thoracic and lumbar spine. Were obtained using the standard protocol without intravenous contrast. RADIATION DOSE REDUCTION: This exam was performed according to the departmental dose-optimization program which includes automated exposure control, adjustment of the mA and/or kV according to patient size and/or use of iterative reconstruction technique. COMPARISON:  02/02/2022 FINDINGS: CT of the thoracic spine: Alignment: Normal sick kyphosis is noted. Vertebrae: 12 thoracic vertebra are noted. Chronic compression fractures are noted at T1, T3 and T2 for. These are stable from prior CT from May of 2023. Multilevel osteophytic changes are noted. No other fractures are noted. Multiple right-sided rib fractures are noted with varying degrees of healing similar to that seen on recent CTA of the chest. Paraspinal and other soft tissues: Paraspinal soft tissues are within normal limits. No focal hematoma is noted. Disc  levels: No significant acute disc pathology is noted CT of lumbar spine: Alignment: Normal lordosis is noted. Vertebrae: 5 lumbar type vertebral bodies are well visualized. Vertebral body height is well maintained with the exception of an acute L1 compression fracture with approximately 30% height loss identified. No other compression fractures are seen. Changes of prior screw fixation of the sacroiliac joints are seen. Prior right iliac bone fracture adjacent to the SI joint is seen with callus formation consistent with healing. Paraspinal and other soft tissues: Paraspinal soft tissues are within normal limits. Disc levels: No specific disc pathology is noted. IMPRESSION: CT of the thoracic spine chronic compression deformities as described. Multiple old right rib fractures are seen. CT of the lumbar spine: Acute L1 compression fracture with 30% height loss identified. Changes of prior SI joint fixation and the right iliac bone fracture with healing are seen. Electronically Signed   By: Inez Catalina M.D.   On: 10/02/2022 21:31   CT T-SPINE NO CHARGE  Result Date: 10/02/2022 CLINICAL DATA:  Recent fall with L1 compression fracture on CTA of the abdomen and pelvis EXAM: CT THORACIC SPINE WITHOUT CONTRAST; CT LUMBAR SPINE WITHOUT CONTRAST TECHNIQUE: Multidetector CT images of the thoracic and lumbar spine. Were obtained using the standard protocol without intravenous contrast. RADIATION DOSE REDUCTION: This exam was performed according to the departmental dose-optimization program which includes automated exposure control, adjustment of the mA and/or kV according to patient size and/or use of iterative reconstruction technique. COMPARISON:  02/02/2022 FINDINGS: CT of the thoracic  spine: Alignment: Normal sick kyphosis is noted. Vertebrae: 12 thoracic vertebra are noted. Chronic compression fractures are noted at T1, T3 and T2 for. These are stable from prior CT from May of 2023. Multilevel osteophytic changes are  noted. No other fractures are noted. Multiple right-sided rib fractures are noted with varying degrees of healing similar to that seen on recent CTA of the chest. Paraspinal and other soft tissues: Paraspinal soft tissues are within normal limits. No focal hematoma is noted. Disc levels: No significant acute disc pathology is noted CT of lumbar spine: Alignment: Normal lordosis is noted. Vertebrae: 5 lumbar type vertebral bodies are well visualized. Vertebral body height is well maintained with the exception of an acute L1 compression fracture with approximately 30% height loss identified. No other compression fractures are seen. Changes of prior screw fixation of the sacroiliac joints are seen. Prior right iliac bone fracture adjacent to the SI joint is seen with callus formation consistent with healing. Paraspinal and other soft tissues: Paraspinal soft tissues are within normal limits. Disc levels: No specific disc pathology is noted. IMPRESSION: CT of the thoracic spine chronic compression deformities as described. Multiple old right rib fractures are seen. CT of the lumbar spine: Acute L1 compression fracture with 30% height loss identified. Changes of prior SI joint fixation and the right iliac bone fracture with healing are seen. Electronically Signed   By: Alcide Clever M.D.   On: 10/02/2022 21:31   CT Angio Chest/Abd/Pel for Dissection W and/or Wo Contrast  Result Date: 10/02/2022 CLINICAL DATA:  Recent trip and fall with back pain, initial encounter EXAM: CT ANGIOGRAPHY CHEST, ABDOMEN AND PELVIS TECHNIQUE: Non-contrast CT of the chest was initially obtained. Multidetector CT imaging through the chest, abdomen and pelvis was performed using the standard protocol during bolus administration of intravenous contrast. Multiplanar reconstructed images and MIPs were obtained and reviewed to evaluate the vascular anatomy. RADIATION DOSE REDUCTION: This exam was performed according to the departmental  dose-optimization program which includes automated exposure control, adjustment of the mA and/or kV according to patient size and/or use of iterative reconstruction technique. CONTRAST:  OMNIPAQUE IOHEXOL 350 MG/ML SOLN COMPARISON:  08/12/2022 FINDINGS: CTA CHEST FINDINGS Cardiovascular: Initial noncontrast images demonstrate no significant atherosclerotic calcifications of the thoracic aorta. Post-contrast images demonstrate a normal branching pattern of the thoracic aorta. No evidence of aneurysmal dilatation or dissection is seen. No cardiac enlargement is noted. Pulmonary artery as visualized is within normal limits. No significant coronary calcifications are seen. Mediastinum/Nodes: Thoracic inlet is within normal limits. No hilar or mediastinal adenopathy is noted. The esophagus is within normal limits. Lungs/Pleura: Lungs are well aerated bilaterally. Emphysematous changes are seen. No focal infiltrate or sizable effusion is seen. No pneumothorax is noted. Musculoskeletal: Multiple old rib fractures with healing are noted on the right posteriorly. No acute rib fracture is seen. Prior shoulder replacement on the left is noted. Multiple chronic appearing compression deformities are noted involving T1, T3 and T4. Additionally, an acute appearing L1 fracture is noted with proximally 30% vertebral body height loss. Review of the MIP images confirms the above findings. CTA ABDOMEN AND PELVIS FINDINGS VASCULAR Aorta: Atherosclerotic calcifications are noted without aneurysmal dilatation or dissection. Celiac: Patent without evidence of aneurysm, dissection, vasculitis or significant stenosis. SMA: Patent without evidence of aneurysm, dissection, vasculitis or significant stenosis. Renals: Single renal artery is noted on right with dual renal arteries on the left. Mild atherosclerotic changes are noted. No focal stenoses are seen. IMA: Patent without evidence  of aneurysm, dissection, vasculitis or significant  stenosis. Inflow: Iliacs show atherosclerotic calcification without focal abnormality. Veins: No specific venous abnormality is seen. Review of the MIP images confirms the above findings. NON-VASCULAR Hepatobiliary: No focal liver abnormality is seen. No gallstones, gallbladder wall thickening, or biliary dilatation. Pancreas: Unremarkable. No pancreatic ductal dilatation or surrounding inflammatory changes. Spleen: Normal in size without focal abnormality. Adrenals/Urinary Tract: Adrenal glands are within normal limits. Kidneys show a normal enhancement pattern bilaterally. Cysts are noted bilaterally the largest of which seen in the upper pole of the right kidney measuring 4.3 cm. These are simple in nature and no further follow-up is recommended. No renal calculi or obstructive changes are seen. The bladder is well distended. Stomach/Bowel: Scattered diverticular change of the colon is noted. No evidence of diverticulitis is seen. The appendix is air-filled and within normal limits. Small bowel and stomach are unremarkable. Lymphatic: No sizable adenopathy is noted. Reproductive: Prostate is unremarkable. Other: No abdominal wall hernia or abnormality. No abdominopelvic ascites. Musculoskeletal: Postsurgical changes are noted in the right hip. Prior pubic rami fractures with healing are noted on the right. 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 These changes are stable from the prior study. Prior screw fixation of the sacroiliac joints is seen. Fracture involving the right iliac bone adjacent to the sacroiliac joint is again seen with some callus formation consistent with. The known L1 compression fracture is seen. Again approximately 30% height loss is noted. Review of the MIP images confirms the above findings. IMPRESSION: CTA of the chest: No evidence of aortic dissection or pulmonary emboli. Multiple old rib fractures are identified on the right. Additionally multiple chronic appearing  compression deformities are noted throughout the thoracic spine as described. These are stable from prior CT from May of 2023. CTA of the abdomen and pelvis: No arterial abnormality is noted. Acute L1 compression fracture. 30% height loss is noted. Diverticulosis without diverticulitis. Chronic pubic rami fractures and right iliac bone fractures as described Electronically Signed   By: Alcide Clever M.D.   On: 10/02/2022 21:24    Pending Labs Unresulted Labs (From admission, onward)    None       Vitals/Pain Today's Vitals   10/03/22 1300 10/03/22 1345 10/03/22 1402 10/03/22 1430  BP: (!) 156/103 (!) 135/94 (!) 135/94 (!) 149/92  Pulse: (!) 110 (!) 109 (!) 109 (!) 116  Resp: (!) 21 (!) 23  (!) 31  Temp:    97.8 F (36.6 C)  TempSrc:    Oral  SpO2: 94% 93%  90%  Weight:      Height:      PainSc:        Isolation Precautions No active isolations  Medications Medications  LORazepam (ATIVAN) tablet 1-4 mg ( Oral See Alternative 10/03/22 1407)    Or  LORazepam (ATIVAN) injection 1-4 mg (2 mg Intravenous Given 10/03/22 1407)  thiamine (VITAMIN B1) tablet 100 mg (100 mg Oral Given 10/03/22 0909)    Or  thiamine (VITAMIN B1) injection 100 mg ( Intravenous See Alternative 10/03/22 0909)  folic acid (FOLVITE) tablet 1 mg (1 mg Oral Given 10/03/22 0909)  multivitamin with minerals tablet 1 tablet (1 tablet Oral Given 10/03/22 0908)  enoxaparin (LOVENOX) injection 40 mg (40 mg Subcutaneous Given 10/03/22 0909)  lidocaine (LIDODERM) 5 % 1 patch (1 patch Transdermal Patch Applied 10/02/22 2312)  oxyCODONE-acetaminophen (PERCOCET/ROXICET) 5-325 MG per tablet 1 tablet (1 tablet Oral Given 10/03/22 0848)  HYDROmorphone (DILAUDID) injection 1  mg (1 mg Intravenous Given 10/03/22 1218)  ibuprofen (ADVIL) tablet 800 mg (has no administration in time range)  atorvastatin (LIPITOR) tablet 40 mg (40 mg Oral Given 10/03/22 1053)  lisinopril (ZESTRIL) tablet 5 mg (5 mg Oral Given 10/03/22 0908)  tamsulosin  (FLOMAX) capsule 0.4 mg (0.4 mg Oral Given 10/03/22 0908)  albuterol (PROVENTIL) (2.5 MG/3ML) 0.083% nebulizer solution 2.5 mg (has no administration in time range)  roflumilast (DALIRESP) tablet 500 mcg (500 mcg Oral Patient Refused/Not Given 10/03/22 0908)  theophylline (UNIPHYL) 400 MG 24 hr tablet 400 mg (400 mg Oral Given 10/03/22 1053)  methocarbamol (ROBAXIN) tablet 500 mg (has no administration in time range)  senna-docusate (Senokot-S) tablet 1 tablet (1 tablet Oral Given 10/03/22 1053)  polyethylene glycol (MIRALAX / GLYCOLAX) packet 17 g (has no administration in time range)  bisacodyl (DULCOLAX) suppository 10 mg (has no administration in time range)  ketamine 50 mg in normal saline 5 mL (10 mg/mL) syringe (25 mg Intravenous Given 10/02/22 1846)  methocarbamol (ROBAXIN) 1,000 mg in dextrose 5 % 100 mL IVPB (0 mg Intravenous Stopped 10/02/22 1922)  morphine (PF) 4 MG/ML injection 8 mg (8 mg Intravenous Given 10/02/22 2023)  metoprolol tartrate (LOPRESSOR) injection 5 mg (5 mg Intravenous Given 10/02/22 2113)  iohexol (OMNIPAQUE) 350 MG/ML injection 100 mL (100 mLs Intravenous Contrast Given 10/02/22 2103)  ketamine 50 mg in normal saline 5 mL (10 mg/mL) syringe (25 mg Intravenous Given 10/02/22 2211)  lactated ringers bolus 1,000 mL (0 mLs Intravenous Stopped 10/02/22 2331)  ketorolac (TORADOL) 15 MG/ML injection 15 mg (15 mg Intravenous Given 10/02/22 2314)  oxyCODONE-acetaminophen (PERCOCET/ROXICET) 5-325 MG per tablet 1.5 tablet (1.5 tablets Oral Given 10/02/22 2315)    Mobility non-ambulatory High fall risk   Focused Assessments CIWA assessments    R Recommendations: See Admitting Provider Note  Report given to:   Additional Notes:

## 2022-10-04 DIAGNOSIS — Z789 Other specified health status: Secondary | ICD-10-CM | POA: Diagnosis not present

## 2022-10-04 DIAGNOSIS — I10 Essential (primary) hypertension: Secondary | ICD-10-CM | POA: Diagnosis not present

## 2022-10-04 DIAGNOSIS — S32018A Other fracture of first lumbar vertebra, initial encounter for closed fracture: Secondary | ICD-10-CM | POA: Diagnosis not present

## 2022-10-04 MED ORDER — GUAIFENESIN ER 600 MG PO TB12
1200.0000 mg | ORAL_TABLET | Freq: Two times a day (BID) | ORAL | Status: DC
  Administered 2022-10-04 – 2022-10-10 (×13): 1200 mg via ORAL
  Filled 2022-10-04 (×14): qty 2

## 2022-10-04 MED ORDER — GUAIFENESIN-DM 100-10 MG/5ML PO SYRP
5.0000 mL | ORAL_SOLUTION | ORAL | Status: DC | PRN
  Administered 2022-10-05 – 2022-10-07 (×6): 5 mL via ORAL
  Filled 2022-10-04 (×7): qty 10

## 2022-10-04 MED ORDER — MOMETASONE FURO-FORMOTEROL FUM 200-5 MCG/ACT IN AERO
2.0000 | INHALATION_SPRAY | Freq: Two times a day (BID) | RESPIRATORY_TRACT | Status: DC
  Administered 2022-10-04: 2 via RESPIRATORY_TRACT
  Filled 2022-10-04: qty 8.8

## 2022-10-04 MED ORDER — IPRATROPIUM-ALBUTEROL 0.5-2.5 (3) MG/3ML IN SOLN
3.0000 mL | Freq: Three times a day (TID) | RESPIRATORY_TRACT | Status: DC
  Administered 2022-10-05 – 2022-10-07 (×7): 3 mL via RESPIRATORY_TRACT
  Filled 2022-10-04 (×7): qty 3

## 2022-10-04 MED ORDER — LORAZEPAM 2 MG/ML IJ SOLN
2.0000 mg | Freq: Once | INTRAMUSCULAR | Status: AC
  Administered 2022-10-04: 2 mg via INTRAVENOUS
  Filled 2022-10-04: qty 1

## 2022-10-04 MED ORDER — IPRATROPIUM-ALBUTEROL 0.5-2.5 (3) MG/3ML IN SOLN
3.0000 mL | RESPIRATORY_TRACT | Status: DC
  Administered 2022-10-04 (×3): 3 mL via RESPIRATORY_TRACT
  Filled 2022-10-04 (×4): qty 3

## 2022-10-04 MED ORDER — LISINOPRIL 10 MG PO TABS
10.0000 mg | ORAL_TABLET | Freq: Every day | ORAL | Status: DC
  Administered 2022-10-04 – 2022-10-14 (×11): 10 mg via ORAL
  Filled 2022-10-04 (×11): qty 1

## 2022-10-04 MED ORDER — METHYLPREDNISOLONE SODIUM SUCC 40 MG IJ SOLR
40.0000 mg | Freq: Two times a day (BID) | INTRAMUSCULAR | Status: DC
  Administered 2022-10-04 – 2022-10-05 (×3): 40 mg via INTRAVENOUS
  Filled 2022-10-04 (×3): qty 1

## 2022-10-04 MED ORDER — BUDESONIDE 0.5 MG/2ML IN SUSP
0.5000 mg | Freq: Two times a day (BID) | RESPIRATORY_TRACT | Status: DC
  Administered 2022-10-04 – 2022-10-14 (×20): 0.5 mg via RESPIRATORY_TRACT
  Filled 2022-10-04 (×20): qty 2

## 2022-10-04 NOTE — Plan of Care (Signed)
  Problem: Education: °Goal: Knowledge of General Education information will improve °Description: Including pain rating scale, medication(s)/side effects and non-pharmacologic comfort measures °Outcome: Not Progressing °  °Problem: Health Behavior/Discharge Planning: °Goal: Ability to manage health-related needs will improve °Outcome: Not Progressing °  °Problem: Clinical Measurements: °Goal: Ability to maintain clinical measurements within normal limits will improve °Outcome: Not Progressing °Goal: Will remain free from infection °Outcome: Not Progressing °Goal: Diagnostic test results will improve °Outcome: Not Progressing °Goal: Respiratory complications will improve °Outcome: Not Progressing °Goal: Cardiovascular complication will be avoided °Outcome: Not Progressing °  °Problem: Activity: °Goal: Risk for activity intolerance will decrease °Outcome: Not Progressing °  °Problem: Nutrition: °Goal: Adequate nutrition will be maintained °Outcome: Not Progressing °  °Problem: Coping: °Goal: Level of anxiety will decrease °Outcome: Not Progressing °  °Problem: Elimination: °Goal: Will not experience complications related to bowel motility °Outcome: Not Progressing °Goal: Will not experience complications related to urinary retention °Outcome: Not Progressing °  °

## 2022-10-04 NOTE — Progress Notes (Signed)
PROGRESS NOTE    Dravyn Severs Crabtree III  LKG:[id number] DOB: February 25, 1956 DOA: 10/02/2022 PCP: Macarthur Critchley, MD   Brief Narrative:  67 y.o. male with medical history significant of COPD with chronic hypoxic respiratory failure on 2L nocturnal O2, OSA on CPAP, HTN, CAD, macular degeneration presented with a mechanical fall after he tripped over his oxygen tubing.  On presentation, he was found to have new L1 compression fracture.  Neurosurgery recommended conservative management with TLSO brace with outpatient follow-up.  He was admitted because of severe pain.  Assessment & Plan:   L1 vertebral compression fracture secondary to mechanical fall Intractable pain Chronic compression fractures of T1, T2 and T3/multiple old right rib fractures -Follow questions.  Still in significant pain.  Continue pain management.  Bowel regimen. - Neurosurgery recommended conservative management with TLSO brace with outpatient follow-up.  -PT eval pending  Alcohol use -Counseled regarding abstinence.  Currently on CIWA protocol and actively withdrawing.  On lorazepam as per protocol.  Continue folic acid, multivitamin and thiamine  Thrombocytopenia -Questionable cause.  Hypertension -Increase lisinopril to 10 mg daily.  Blood pressure on the high side intermittently.  Hyperlipidemia -Continue statin  COPD with possible exacerbation. Chronic respiratory failure with hypoxia -Uses 2 to 3 L oxygen at night normally at home.  Respiratory status has worsened and currently requiring 6 L high flow nasal cannula oxygen.  Will add nebs.  Also start Solu-Medrol 40 mg IV every 12 hours.  DVT prophylaxis: Lovenox Code Status: Full Family Communication: None at bedside Disposition Plan: Status is:  inpatient because: Still in significant pain.  PT eval pending    Consultants: EDP spoke to neurosurgery on-call on admission  Procedures: None  Antimicrobials: None   Subjective: Patient seen and examined  at bedside.  Slightly confused, still complains of intermittent severe back pain.  Short of breath with exertion.  No fever, vomiting, seizures reported. Objective: Vitals:   10/03/22 1808 10/03/22 1953 10/03/22 2221 10/04/22 0504  BP: (!) 124/99 (!) 151/99 (!) 132/32 (!) 143/102  Pulse:  (!) 122 (!) 126 (!) 108  Resp:  16 18 (!) 28  Temp:  98.3 F (36.8 C)  98 F (36.7 C)  TempSrc:  Oral  Oral  SpO2: 94% 94%  97%  Weight:      Height:       No intake or output data in the 24 hours ending 10/04/22 0725  Filed Weights   10/02/22 1800  Weight: 104.3 kg    Examination:  General: On 6 L oxygen via nasal cannula.  No distress.  Looks chronically ill and deconditioned ENT/neck: No thyromegaly.  JVD is not elevated  respiratory: Decreased breath sounds at bases bilaterally with some crackles; some wheezing present; tachypneic  CVS: S1-S2 heard, tachycardic Abdominal: Soft, nontender, slightly distended; no organomegaly, normal bowel sounds are heard Extremities: Trace lower extremity edema; no cyanosis  CNS: Awake and alert.  Slow to respond.  Poor historian.  Slightly confused to time.  No focal neurologic deficit.  Moves extremities Lymph: No obvious lymphadenopathy Skin: No obvious ecchymosis/lesions  psych: Not agitated.  Affect is mostly flat.   Musculoskeletal: No obvious joint swelling/deformity     Data Reviewed: I have personally reviewed following labs and imaging studies  CBC: Recent Labs  Lab 10/02/22 1807 10/03/22 0817  WBC 7.7 6.0  NEUTROABS 6.3  --   HGB 14.7 13.5  HCT 42.4 39.2  MCV 98.8 100.0  PLT 162 130*    Basic Metabolic  Panel: Recent Labs  Lab 10/02/22 1807  NA 136  K 3.9  CL 103  CO2 22  GLUCOSE 99  BUN 9  CREATININE 0.72  CALCIUM 9.3    GFR: Estimated Creatinine Clearance: 116.9 mL/min (by C-G formula based on SCr of 0.72 mg/dL). Liver Function Tests: Recent Labs  Lab 10/02/22 1807  AST 44*  ALT 38  ALKPHOS 110  BILITOT 1.1   PROT 7.5  ALBUMIN 4.3    No results for input(s): "LIPASE", "AMYLASE" in the last 168 hours. No results for input(s): "AMMONIA" in the last 168 hours. Coagulation Profile: No results for input(s): "INR", "PROTIME" in the last 168 hours. Cardiac Enzymes: Recent Labs  Lab 10/02/22 1807  CKTOTAL 171    BNP (last 3 results) No results for input(s): "PROBNP" in the last 8760 hours. HbA1C: No results for input(s): "HGBA1C" in the last 72 hours. CBG: No results for input(s): "GLUCAP" in the last 168 hours. Lipid Profile: No results for input(s): "CHOL", "HDL", "LDLCALC", "TRIG", "CHOLHDL", "LDLDIRECT" in the last 72 hours. Thyroid Function Tests: No results for input(s): "TSH", "T4TOTAL", "FREET4", "T3FREE", "THYROIDAB" in the last 72 hours. Anemia Panel: No results for input(s): "VITAMINB12", "FOLATE", "FERRITIN", "TIBC", "IRON", "RETICCTPCT" in the last 72 hours. Sepsis Labs: No results for input(s): "PROCALCITON", "LATICACIDVEN" in the last 168 hours.  No results found for this or any previous visit (from the past 240 hour(s)).       Radiology Studies: CT L-SPINE NO CHARGE  Result Date: 10/02/2022 CLINICAL DATA:  Recent fall with L1 compression fracture on CTA of the abdomen and pelvis EXAM: CT THORACIC SPINE WITHOUT CONTRAST; CT LUMBAR SPINE WITHOUT CONTRAST TECHNIQUE: Multidetector CT images of the thoracic and lumbar spine. Were obtained using the standard protocol without intravenous contrast. RADIATION DOSE REDUCTION: This exam was performed according to the departmental dose-optimization program which includes automated exposure control, adjustment of the mA and/or kV according to patient size and/or use of iterative reconstruction technique. COMPARISON:  02/02/2022 FINDINGS: CT of the thoracic spine: Alignment: Normal sick kyphosis is noted. Vertebrae: 12 thoracic vertebra are noted. Chronic compression fractures are noted at T1, T3 and T2 for. These are stable from prior  CT from May of 2023. Multilevel osteophytic changes are noted. No other fractures are noted. Multiple right-sided rib fractures are noted with varying degrees of healing similar to that seen on recent CTA of the chest. Paraspinal and other soft tissues: Paraspinal soft tissues are within normal limits. No focal hematoma is noted. Disc levels: No significant acute disc pathology is noted CT of lumbar spine: Alignment: Normal lordosis is noted. Vertebrae: 5 lumbar type vertebral bodies are well visualized. Vertebral body height is well maintained with the exception of an acute L1 compression fracture with approximately 30% height loss identified. No other compression fractures are seen. Changes of prior screw fixation of the sacroiliac joints are seen. Prior right iliac bone fracture adjacent to the SI joint is seen with callus formation consistent with healing. Paraspinal and other soft tissues: Paraspinal soft tissues are within normal limits. Disc levels: No specific disc pathology is noted. IMPRESSION: CT of the thoracic spine chronic compression deformities as described. Multiple old right rib fractures are seen. CT of the lumbar spine: Acute L1 compression fracture with 30% height loss identified. Changes of prior SI joint fixation and the right iliac bone fracture with healing are seen. Electronically Signed   By: Alcide Clever M.D.   On: 10/02/2022 21:31   CT T-SPINE NO  CHARGE  Result Date: 10/02/2022 CLINICAL DATA:  Recent fall with L1 compression fracture on CTA of the abdomen and pelvis EXAM: CT THORACIC SPINE WITHOUT CONTRAST; CT LUMBAR SPINE WITHOUT CONTRAST TECHNIQUE: Multidetector CT images of the thoracic and lumbar spine. Were obtained using the standard protocol without intravenous contrast. RADIATION DOSE REDUCTION: This exam was performed according to the departmental dose-optimization program which includes automated exposure control, adjustment of the mA and/or kV according to patient size  and/or use of iterative reconstruction technique. COMPARISON:  02/02/2022 FINDINGS: CT of the thoracic spine: Alignment: Normal sick kyphosis is noted. Vertebrae: 12 thoracic vertebra are noted. Chronic compression fractures are noted at T1, T3 and T2 for. These are stable from prior CT from May of 2023. Multilevel osteophytic changes are noted. No other fractures are noted. Multiple right-sided rib fractures are noted with varying degrees of healing similar to that seen on recent CTA of the chest. Paraspinal and other soft tissues: Paraspinal soft tissues are within normal limits. No focal hematoma is noted. Disc levels: No significant acute disc pathology is noted CT of lumbar spine: Alignment: Normal lordosis is noted. Vertebrae: 5 lumbar type vertebral bodies are well visualized. Vertebral body height is well maintained with the exception of an acute L1 compression fracture with approximately 30% height loss identified. No other compression fractures are seen. Changes of prior screw fixation of the sacroiliac joints are seen. Prior right iliac bone fracture adjacent to the SI joint is seen with callus formation consistent with healing. Paraspinal and other soft tissues: Paraspinal soft tissues are within normal limits. Disc levels: No specific disc pathology is noted. IMPRESSION: CT of the thoracic spine chronic compression deformities as described. Multiple old right rib fractures are seen. CT of the lumbar spine: Acute L1 compression fracture with 30% height loss identified. Changes of prior SI joint fixation and the right iliac bone fracture with healing are seen. Electronically Signed   By: Alcide Clever M.D.   On: 10/02/2022 21:31   CT Angio Chest/Abd/Pel for Dissection W and/or Wo Contrast  Result Date: 10/02/2022 CLINICAL DATA:  Recent trip and fall with back pain, initial encounter EXAM: CT ANGIOGRAPHY CHEST, ABDOMEN AND PELVIS TECHNIQUE: Non-contrast CT of the chest was initially obtained.  Multidetector CT imaging through the chest, abdomen and pelvis was performed using the standard protocol during bolus administration of intravenous contrast. Multiplanar reconstructed images and MIPs were obtained and reviewed to evaluate the vascular anatomy. RADIATION DOSE REDUCTION: This exam was performed according to the departmental dose-optimization program which includes automated exposure control, adjustment of the mA and/or kV according to patient size and/or use of iterative reconstruction technique. CONTRAST:  OMNIPAQUE IOHEXOL 350 MG/ML SOLN COMPARISON:  08/12/2022 FINDINGS: CTA CHEST FINDINGS Cardiovascular: Initial noncontrast images demonstrate no significant atherosclerotic calcifications of the thoracic aorta. Post-contrast images demonstrate a normal branching pattern of the thoracic aorta. No evidence of aneurysmal dilatation or dissection is seen. No cardiac enlargement is noted. Pulmonary artery as visualized is within normal limits. No significant coronary calcifications are seen. Mediastinum/Nodes: Thoracic inlet is within normal limits. No hilar or mediastinal adenopathy is noted. The esophagus is within normal limits. Lungs/Pleura: Lungs are well aerated bilaterally. Emphysematous changes are seen. No focal infiltrate or sizable effusion is seen. No pneumothorax is noted. Musculoskeletal: Multiple old rib fractures with healing are noted on the right posteriorly. No acute rib fracture is seen. Prior shoulder replacement on the left is noted. Multiple chronic appearing compression deformities are noted involving T1, T3  and T4. Additionally, an acute appearing L1 fracture is noted with proximally 30% vertebral body height loss. Review of the MIP images confirms the above findings. CTA ABDOMEN AND PELVIS FINDINGS VASCULAR Aorta: Atherosclerotic calcifications are noted without aneurysmal dilatation or dissection. Celiac: Patent without evidence of aneurysm, dissection, vasculitis or  significant stenosis. SMA: Patent without evidence of aneurysm, dissection, vasculitis or significant stenosis. Renals: Single renal artery is noted on right with dual renal arteries on the left. Mild atherosclerotic changes are noted. No focal stenoses are seen. IMA: Patent without evidence of aneurysm, dissection, vasculitis or significant stenosis. Inflow: Iliacs show atherosclerotic calcification without focal abnormality. Veins: No specific venous abnormality is seen. Review of the MIP images confirms the above findings. NON-VASCULAR Hepatobiliary: No focal liver abnormality is seen. No gallstones, gallbladder wall thickening, or biliary dilatation. Pancreas: Unremarkable. No pancreatic ductal dilatation or surrounding inflammatory changes. Spleen: Normal in size without focal abnormality. Adrenals/Urinary Tract: Adrenal glands are within normal limits. Kidneys show a normal enhancement pattern bilaterally. Cysts are noted bilaterally the largest of which seen in the upper pole of the right kidney measuring 4.3 cm. These are simple in nature and no further follow-up is recommended. No renal calculi or obstructive changes are seen. The bladder is well distended. Stomach/Bowel: Scattered diverticular change of the colon is noted. No evidence of diverticulitis is seen. The appendix is air-filled and within normal limits. Small bowel and stomach are unremarkable. Lymphatic: No sizable adenopathy is noted. Reproductive: Prostate is unremarkable. Other: No abdominal wall hernia or abnormality. No abdominopelvic ascites. Musculoskeletal: Postsurgical changes are noted in the right hip. Prior pubic rami fractures with healing are noted on the right. 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 These changes are stable from the prior study. Prior screw fixation of the sacroiliac joints is seen. Fracture involving the right iliac bone adjacent to the sacroiliac joint is again seen with some callus formation  consistent with. The known L1 compression fracture is seen. Again approximately 30% height loss is noted. Review of the MIP images confirms the above findings. IMPRESSION: CTA of the chest: No evidence of aortic dissection or pulmonary emboli. Multiple old rib fractures are identified on the right. Additionally multiple chronic appearing compression deformities are noted throughout the thoracic spine as described. These are stable from prior CT from May of 2023. CTA of the abdomen and pelvis: No arterial abnormality is noted. Acute L1 compression fracture. 30% height loss is noted. Diverticulosis without diverticulitis. Chronic pubic rami fractures and right iliac bone fractures as described Electronically Signed   By: Inez Catalina M.D.   On: 10/02/2022 21:24        Scheduled Meds:  atorvastatin  40 mg Oral Daily   enoxaparin (LOVENOX) injection  40 mg Subcutaneous Daily   folic acid  1 mg Oral Daily   guaiFENesin  600 mg Oral BID   lidocaine  1 patch Transdermal QHS   lisinopril  5 mg Oral Daily   multivitamin with minerals  1 tablet Oral Daily   roflumilast  500 mcg Oral Daily   senna-docusate  1 tablet Oral BID   tamsulosin  0.4 mg Oral Daily   theophylline  400 mg Oral Daily   thiamine  100 mg Oral Daily   Or   thiamine  100 mg Intravenous Daily   Continuous Infusions:        Aline August, MD [facility] 10/04/2022, 7:25 AM

## 2022-10-04 NOTE — Evaluation (Signed)
Physical Therapy Evaluation Patient Details Name: [name] MRN: [id number] DOB: 1956-05-09 Today's Date: 10/04/2022  History of Present Illness  67 y.o. male presents to [facility] hospital on 10/02/2022 after fall. CT scan demonstrates new L1 compression fx. PMH includes COPD, GERD, HLD, HTN, OSA, tracheostomy 08/10/2021.  Clinical Impression  Pt admitted with above diagnosis. 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 Pt able to come to EOB with min A. Brace was too small, called ortho tech to bring larger one. Pt able to stand with RW and min A +2 but unsafe to step away from bed and did not have properly fitting brace yet. Recommending SNF in current mental state, will hopefully progress to [facility]  Pt currently with functional limitations due to the deficits listed below (see PT Problem List). Pt will benefit from skilled PT to increase their independence and safety with mobility to allow discharge to the venue listed below.          Recommendations for follow up therapy are one component of a multi-disciplinary discharge planning process, led by the attending physician.  Recommendations may be updated based on patient status, additional functional criteria and insurance authorization.  Follow Up Recommendations Skilled nursing-short term rehab (<3 hours/day) Can patient physically be transported by private vehicle: No    Assistance Recommended at Discharge Frequent or constant Supervision/Assistance  Patient can return home with the following  Two people to help with walking and/or transfers;Two people to help with bathing/dressing/bathroom;Assistance with cooking/housework;Assist for transportation;Help with stairs or ramp for entrance    Equipment Recommendations Rolling walker (2 wheels)  Recommendations for Other Services       Functional Status Assessment Patient has had a recent decline in their functional  status and demonstrates the ability to make significant improvements in function in a reasonable and predictable amount of time.     Precautions / Restrictions Precautions Precautions: Back;Fall Precaution Booklet Issued: Yes (comment) Precaution Comments: bacl precautions given for pain control Required Braces or Orthoses: Spinal Brace Spinal Brace: Thoracolumbosacral orthotic (brace too small, spoke with ortho tech and they plan to bring a larger one) Restrictions Weight Bearing Restrictions: No      Mobility  Bed Mobility Overal bed mobility: Needs Assistance Bed Mobility: Rolling, Sidelying to Sit, Supine to Sit Rolling: Min assist Sidelying to sit: Min assist Supine to sit: Mod assist     General bed mobility comments: min A to keep precautions and sequence. Mod A for LE's back into bed with return to supine. Pt needed +2 assist to position in supine because he kept resisting assistance despite vc's to relax. Throughout all mobility pt holding breath and grunting/ groaning. Attempted to don brace EOB but it was too small. Ortho tech to bring larger size    Transfers Overall transfer level: Needs assistance Equipment used: Rolling walker (2 wheels) Transfers: Sit to/from Stand Sit to Stand: Min assist, +2 safety/equipment, From elevated surface           General transfer comment: vc's for hand placement, min A for power up and to steady    Ambulation/Gait Ambulation/Gait assistance: Min assist, +2 safety/equipment Gait Distance (Feet): 2 Feet Assistive device: Rolling walker (2 wheels) Gait Pattern/deviations: Step-to pattern Gait velocity: decreased Gait velocity interpretation: <1.31 ft/sec, indicative of household ambulator   General Gait Details: took sidesteps to [facility] with RW and min A. Pt unsafe to move away  from bed or pivot to chair on eval due to confusion  Stairs            Wheelchair Mobility    Modified Rankin (Stroke Patients Only)        Balance Overall balance assessment: Needs assistance Sitting-balance support: Bilateral upper extremity supported, Feet supported Sitting balance-Leahy Scale: Poor Sitting balance - Comments: heavily reliant on UE's in sitting due to pain Postural control: Left lateral lean Standing balance support: Bilateral upper extremity supported, During functional activity, Reliant on assistive device for balance Standing balance-Leahy Scale: Poor Standing balance comment: needs UE and external assist                             Pertinent Vitals/Pain Pain Assessment Pain Assessment: Faces Faces Pain Scale: Hurts even more Pain Location: low back Pain Descriptors / Indicators: Moaning Pain Intervention(s): Limited activity within patient's tolerance, Monitored during session, Premedicated before session    Home Living Family/patient expects to be discharged to:: Private residence Living Arrangements: Alone Available Help at Discharge: Family;Available PRN/intermittently;Other (Comment) (girlfriend) Type of Home: House Home Access: Stairs to enter Entrance Stairs-Rails: Right;Left;Can reach both Entrance Stairs-Number of Steps: 3   Home Layout: One level Home Equipment:  (unsure) Additional Comments: sister and girlfriend live nearby. Info is from previous admission, pt not a reliable historian today due to confusion    Prior Function Prior Level of Function : Patient poor historian/Family not available                     Hand Dominance   Dominant Hand: Right    Extremity/Trunk Assessment   Upper Extremity Assessment Upper Extremity Assessment: Overall WFL for tasks assessed    Lower Extremity Assessment Lower Extremity Assessment: Difficult to assess due to impaired cognition    Cervical / Trunk Assessment Cervical / Trunk Assessment: Other exceptions Cervical / Trunk Exceptions: L1 fx  Communication   Communication: No difficulties  Cognition  Arousal/Alertness: Awake/alert Behavior During Therapy: Restless Overall Cognitive Status: Impaired/Different from baseline Area of Impairment: Orientation, Attention, Memory, Following commands, Safety/judgement, Awareness, Problem solving                 Orientation Level: Disoriented to, Place, Time, Situation Current Attention Level: Focused Memory: Decreased recall of precautions, Decreased short-term memory Following Commands: Follows one step commands consistently Safety/Judgement: Decreased awareness of safety, Decreased awareness of deficits Awareness: Intellectual Problem Solving: Slow processing, Decreased initiation, Difficulty sequencing, Requires verbal cues, Requires tactile cues General Comments: pt with fluctuating orientation, can sometimes answer correctly and at other times is way off for ex, told the MD it was [id number]. Pt very confused or maybe hallucinating, talking about the show and the boy scouts and asking about things in the room that are not there. RN reports that he was not like this yesterday. MD present for part of session and aware        General Comments General comments (skin integrity, edema, etc.): SPO2 dropped to 88% on RA and 84% due to pt holding breath. Returned to 90's on 3L. HR up to 120's with mobility    Exercises     Assessment/Plan    PT Assessment Patient needs continued PT services  PT Problem List Decreased strength;Decreased activity tolerance;Decreased balance;Decreased mobility;Decreased coordination;Decreased cognition;Decreased knowledge of use of DME;Decreased safety awareness;Decreased knowledge of precautions;Pain;Cardiopulmonary status limiting activity       PT Treatment Interventions DME instruction;Gait training;Stair  training;Functional mobility training;Therapeutic activities;Therapeutic exercise;Balance training;Neuromuscular re-education;Cognitive remediation;Patient/family education    PT Goals (Current goals can be  found in the Care Plan section)  Acute Rehab PT Goals Patient Stated Goal: return home PT Goal Formulation: With patient Time For Goal Achievement: 10/18/22 Potential to Achieve Goals: Good    Frequency Min 3X/week     Co-evaluation               AM-PAC PT "6 Clicks" Mobility  Outcome Measure Help needed turning from your back to your side while in a flat bed without using bedrails?: A Little Help needed moving from lying on your back to sitting on the side of a flat bed without using bedrails?: A Little Help needed moving to and from a bed to a chair (including a wheelchair)?: A Lot Help needed standing up from a chair using your arms (e.g., wheelchair or bedside chair)?: A Lot Help needed to walk in hospital room?: Total Help needed climbing 3-5 steps with a railing? : Total 6 Click Score: 12    End of Session Equipment Utilized During Treatment: Gait belt Activity Tolerance: Treatment limited secondary to agitation Patient left: in bed;with call bell/phone within reach;with bed alarm set Nurse Communication: Mobility status;Other (comment) (need bigger brace, cognition) PT Visit Diagnosis: Unsteadiness on feet (R26.81);Difficulty in walking, not elsewhere classified (R26.2);Pain Pain - part of body:  (back)    Time: 0922-0949 PT Time Calculation (min) (ACUTE ONLY): 27 min   Charges:   PT Evaluation $PT Eval Moderate Complexity: 1 Mod PT Treatments $Therapeutic Activity: 8-22 mins        Leighton Roach, PT  Acute Rehab Services Secure chat preferred Office [place] 10/04/2022, 12:38 PM

## 2022-10-05 DIAGNOSIS — I10 Essential (primary) hypertension: Secondary | ICD-10-CM | POA: Diagnosis not present

## 2022-10-05 DIAGNOSIS — F1090 Alcohol use, unspecified, uncomplicated: Secondary | ICD-10-CM

## 2022-10-05 DIAGNOSIS — S32018A Other fracture of first lumbar vertebra, initial encounter for closed fracture: Secondary | ICD-10-CM | POA: Diagnosis not present

## 2022-10-05 LAB — CBC WITH DIFFERENTIAL/PLATELET
Abs Immature Granulocytes: 0.04 10*3/uL (ref 0.00–0.07)
Basophils Absolute: 0 10*3/uL (ref 0.0–0.1)
Basophils Relative: 0 %
Eosinophils Absolute: 0 10*3/uL (ref 0.0–0.5)
Eosinophils Relative: 0 %
HCT: 38.4 % — ABNORMAL LOW (ref 39.0–52.0)
Hemoglobin: 13.1 g/dL (ref 13.0–17.0)
Immature Granulocytes: 1 %
Lymphocytes Relative: 4 %
Lymphs Abs: 0.3 10*3/uL — ABNORMAL LOW (ref 0.7–4.0)
MCH: 33.9 pg (ref 26.0–34.0)
MCHC: 34.1 g/dL (ref 30.0–36.0)
MCV: 99.5 fL (ref 80.0–100.0)
Monocytes Absolute: 0.4 10*3/uL (ref 0.1–1.0)
Monocytes Relative: 4 %
Neutro Abs: 7.6 10*3/uL (ref 1.7–7.7)
Neutrophils Relative %: 91 %
Platelets: 153 10*3/uL (ref 150–400)
RBC: 3.86 MIL/uL — ABNORMAL LOW (ref 4.22–5.81)
RDW: 12.3 % (ref 11.5–15.5)
WBC: 8.4 10*3/uL (ref 4.0–10.5)
nRBC: 0 % (ref 0.0–0.2)

## 2022-10-05 LAB — COMPREHENSIVE METABOLIC PANEL
ALT: 26 U/L (ref 0–44)
AST: 30 U/L (ref 15–41)
Albumin: 3.8 g/dL (ref 3.5–5.0)
Alkaline Phosphatase: 103 U/L (ref 38–126)
Anion gap: 10 (ref 5–15)
BUN: 17 mg/dL (ref 8–23)
CO2: 23 mmol/L (ref 22–32)
Calcium: 9 mg/dL (ref 8.9–10.3)
Chloride: 101 mmol/L (ref 98–111)
Creatinine, Ser: 0.67 mg/dL (ref 0.61–1.24)
GFR, Estimated: >60 mL/min (ref >60)
Glucose, Bld: 148 mg/dL — ABNORMAL HIGH (ref 70–99)
Potassium: 3.7 mmol/L (ref 3.5–5.1)
Sodium: 134 mmol/L — ABNORMAL LOW (ref 135–145)
Total Bilirubin: 1.1 mg/dL (ref 0.3–1.2)
Total Protein: 7 g/dL (ref 6.5–8.1)

## 2022-10-05 LAB — MAGNESIUM: Magnesium: 2.1 mg/dL (ref 1.7–2.4)

## 2022-10-05 MED ORDER — LABETALOL HCL 5 MG/ML IV SOLN
10.0000 mg | INTRAVENOUS | Status: DC | PRN
  Administered 2022-10-07: 10 mg via INTRAVENOUS
  Filled 2022-10-05: qty 4

## 2022-10-05 MED ORDER — LORAZEPAM 1 MG PO TABS
1.0000 mg | ORAL_TABLET | ORAL | Status: DC | PRN
  Administered 2022-10-06: 1 mg via ORAL
  Filled 2022-10-05: qty 1

## 2022-10-05 MED ORDER — PANTOPRAZOLE SODIUM 40 MG PO TBEC
40.0000 mg | DELAYED_RELEASE_TABLET | Freq: Every day | ORAL | Status: DC
  Administered 2022-10-05 – 2022-10-14 (×10): 40 mg via ORAL
  Filled 2022-10-05 (×10): qty 1

## 2022-10-05 MED ORDER — LACTULOSE 10 GM/15ML PO SOLN
20.0000 g | Freq: Two times a day (BID) | ORAL | Status: DC | PRN
  Administered 2022-10-12: 20 g via ORAL
  Filled 2022-10-05 (×2): qty 30

## 2022-10-05 MED ORDER — ALUM & MAG HYDROXIDE-SIMETH 200-200-20 MG/5ML PO SUSP
30.0000 mL | ORAL | Status: DC | PRN
  Administered 2022-10-05 – 2022-10-06 (×3): 30 mL via ORAL
  Filled 2022-10-05 (×4): qty 30

## 2022-10-05 MED ORDER — SODIUM CHLORIDE 0.9 % IV SOLN: INTRAVENOUS | Status: DC

## 2022-10-05 MED ORDER — LORAZEPAM 2 MG/ML IJ SOLN: 1.0000 mg | INTRAMUSCULAR | Status: DC | PRN

## 2022-10-05 MED ORDER — HYDRALAZINE HCL 25 MG PO TABS
25.0000 mg | ORAL_TABLET | ORAL | Status: DC | PRN
  Administered 2022-10-05: 25 mg via ORAL
  Filled 2022-10-05: qty 1

## 2022-10-05 NOTE — Hospital Course (Signed)
[name] is a 67 yo male with PMH chronic alcohol use, chronic back pain, COPD, chronic hypoxic respiratory failure (on 2 L with CPAP and PRN), OSA on CPAP, HTN, CAD, macular degeneration.  He presented after tripping over oxygen tubing at home and was brought in due to worsening back pain. He underwent imaging of T and L-spine which showed acute L1 compression fracture.  He also has known chronic thoracic compression fractures, chronic pubic rami fractures, right iliac bone fractures, and multiple old right rib fractures.  Case was discussed with neurosurgery and he was recommended for TLSO bracing for comfort when out of bed and outpatient follow-up. He began experiencing alcohol withdrawal soon after admission and was placed on CIWA protocol.

## 2022-10-05 NOTE — Progress Notes (Addendum)
Pts BP/HR elevated.  Will reassess after one hr.  Pain med and anxiety med given.    0300 pt has order to insert foley if bladder scan >350. Pt bladder scanned with 365 in bladder Foley inserted w/ 400 returned plus pts foley was leaking as he tried to urinate.  Foley adjusted will monitor.

## 2022-10-05 NOTE — Progress Notes (Signed)
Progress Note    [name]   ZHY:[id number]  DOB: 1956/02/09  DOA: 10/02/2022     2 PCP: Macarthur Critchley, MD  Initial CC: fall at home  Hospital Course: [name] is a 67 yo male with PMH chronic alcohol use, chronic back pain, COPD, chronic hypoxic respiratory failure (on 2 L with CPAP and PRN), OSA on CPAP, HTN, CAD, macular degeneration.  He presented after tripping over oxygen tubing at home and was brought in due to worsening back pain. He underwent imaging of T and L-spine which showed acute L1 compression fracture.  He also has known chronic thoracic compression fractures, chronic pubic rami fractures, right iliac bone fractures, and multiple old right rib fractures.  Case was discussed with neurosurgery and he was recommended for TLSO bracing for comfort when out of bed and outpatient follow-up. He began experiencing alcohol withdrawal soon after admission and was placed on CIWA protocol.  Interval History:  Patient seen this morning with daughter present bedside.  He was sleeping soundly but awoken easily.  He was actually oriented x 3 but does have obvious tremor in upper extremities.  He has been confused per daughter earlier this morning consistent with his ongoing withdrawal.  Assessment and Plan:  Acute L1 compression fracture - due to mechanical fall at home (tripped over O2 tubing); patient not felt to be intoxicated upon initial evaluation by response teams -Discussed with neurosurgery, recommended for TLSO brace when out of bed for comfort - Outpatient follow-up with neurosurgery - Continue working with PT/OT - Likely needs SNF   Alcohol use disorder - Per daughter, patient drinks possibly up to 20 beers per day.  Patient does underreport usage -High CIWA scores - Continue on CIWA protocol.  If does show worsening signs of withdrawal, may need transfer to stepdown for Precedex -Continue thiamine, folate, multivitamin  Acute urinary retention - Suspect due  to overall deconditioning and worsening back pain with L1 compression fracture.  -Continue bladder scanning - s/p straight cath this afternoon, ~900 cc voided    Thrombocytopenia -Suspect bone marrow suppression due to chronic alcohol use   Hypertension -Continue lisinopril - PRN labetalol or hydralazine ordered as well   Hyperlipidemia -Continue statin   COPD Chronic respiratory failure with hypoxia -Uses 2 to 3 L oxygen at night normally at home.   -Some concern for exacerbation on admission and steroids were initiated.  No significant wheezing appreciated today - DC steroids and monitor clinically again - Continue breathing treatments and nebulizers   Old records reviewed in assessment of this patient  Antimicrobials:   DVT prophylaxis:  enoxaparin (LOVENOX) injection 40 mg Start: 10/03/22 1000   Code Status:   Code Status: Full Code  Mobility Assessment (last 72 hours)     Mobility Assessment     Row Name 10/05/22 1133 10/05/22 0900 10/04/22 1959 10/04/22 1225 10/04/22 0810   Does patient have an order for bedrest or is patient medically unstable -- No - Continue assessment No - Continue assessment -- No - Continue assessment   What is the highest level of mobility based on the progressive mobility assessment? Level 3 (Stands with assist) - Balance while standing  and cannot march in place Level 3 (Stands with assist) - Balance while standing  and cannot march in place Level 1 (Bedfast) - Unable to balance while sitting on edge of bed Level 3 (Stands with assist) - Balance while standing  and cannot march in place Level 4 [facility]  with assist in room) - Balance while marching in place and cannot step forward and back - Complete   Is the above level different from baseline mobility prior to current illness? -- Yes - Recommend PT order No - Consider discontinuing PT/OT -- Yes - Recommend PT order    Row Name 10/03/22 1954 10/03/22 1635         Does patient have an order  for bedrest or is patient medically unstable No - Continue assessment No - Continue assessment      What is the highest level of mobility based on the progressive mobility assessment? Level 6 (Walks independently in room and hall) - Balance while walking in room without assist - Complete --      Is the above level different from baseline mobility prior to current illness? -- Yes - Recommend PT order               Barriers to discharge:  Disposition Plan:  pending PT eval Status is: Inpt  Objective: Blood pressure (!) 140/97, pulse (!) 106, temperature 97.9 F (36.6 C), temperature source Oral, resp. rate 16, height 6\' 2"  (1.88 m), weight 104.3 kg, SpO2 94 %.  Examination:  Physical Exam Constitutional:      General: He is not in acute distress.    Appearance: He is obese. He is not ill-appearing.  HENT:     Head: Normocephalic and atraumatic.     Mouth/Throat:     Mouth: Mucous membranes are moist.  Eyes:     Extraocular Movements: Extraocular movements intact.  Cardiovascular:     Rate and Rhythm: Normal rate and regular rhythm.     Heart sounds: Normal heart sounds.  Pulmonary:     Effort: Pulmonary effort is normal. No respiratory distress.     Breath sounds: Normal breath sounds. No wheezing.  Abdominal:     General: Bowel sounds are normal. There is no distension.     Palpations: Abdomen is soft.     Tenderness: There is no abdominal tenderness.  Musculoskeletal:        General: Normal range of motion.     Cervical back: Normal range of motion and neck supple.  Skin:    General: Skin is warm and dry.  Neurological:     General: No focal deficit present.     Mental Status: He is oriented to person, place, and time.     Comments: Tremor appreciated in bilateral upper extremities  Psychiatric:        Mood and Affect: Mood normal.        Behavior: Behavior normal.      Consultants:    Procedures:    Data Reviewed: Results for orders placed or performed  during the hospital encounter of 10/02/22 (from the past 24 hour(s))  CBC with Differential/Platelet     Status: Abnormal   Collection Time: 10/05/22  4:11 AM  Result Value Ref Range   WBC 8.4 4.0 - 10.5 K/uL   RBC 3.86 (L) 4.22 - 5.81 MIL/uL   Hemoglobin 13.1 13.0 - 17.0 g/dL   HCT 10/07/22 (L) 66.0 - 63.0 %   MCV 99.5 80.0 - 100.0 fL   MCH 33.9 26.0 - 34.0 pg   MCHC 34.1 30.0 - 36.0 g/dL   RDW 16.0 10.9 - 32.3 %   Platelets 153 150 - 400 K/uL   nRBC 0.0 0.0 - 0.2 %   Neutrophils Relative % 91 %   Neutro Abs 7.6 1.7 -  7.7 K/uL   Lymphocytes Relative 4 %   Lymphs Abs 0.3 (L) 0.7 - 4.0 K/uL   Monocytes Relative 4 %   Monocytes Absolute 0.4 0.1 - 1.0 K/uL   Eosinophils Relative 0 %   Eosinophils Absolute 0.0 0.0 - 0.5 K/uL   Basophils Relative 0 %   Basophils Absolute 0.0 0.0 - 0.1 K/uL   Immature Granulocytes 1 %   Abs Immature Granulocytes 0.04 0.00 - 0.07 K/uL  Comprehensive metabolic panel     Status: Abnormal   Collection Time: 10/05/22  4:11 AM  Result Value Ref Range   Sodium 134 (L) 135 - 145 mmol/L   Potassium 3.7 3.5 - 5.1 mmol/L   Chloride 101 98 - 111 mmol/L   CO2 23 22 - 32 mmol/L   Glucose, Bld 148 (H) 70 - 99 mg/dL   BUN 17 8 - 23 mg/dL   Creatinine, Ser 0.67 0.61 - 1.24 mg/dL   Calcium 9.0 8.9 - 10.3 mg/dL   Total Protein 7.0 6.5 - 8.1 g/dL   Albumin 3.8 3.5 - 5.0 g/dL   AST 30 15 - 41 U/L   ALT 26 0 - 44 U/L   Alkaline Phosphatase 103 38 - 126 U/L   Total Bilirubin 1.1 0.3 - 1.2 mg/dL   GFR, Estimated >60 >60 mL/min   Anion gap 10 5 - 15  Magnesium     Status: None   Collection Time: 10/05/22  4:11 AM  Result Value Ref Range   Magnesium 2.1 1.7 - 2.4 mg/dL    I have reviewed pertinent nursing notes, vitals, labs, and images as necessary. I have ordered labwork to follow up on as indicated.  I have reviewed the last notes from staff over past 24 hours. I have discussed patient's care plan and test results with nursing staff, CM/SW, and other staff as  appropriate.  Time spent: Greater than 50% of the 55 minute visit was spent in counseling/coordination of care for the patient as laid out in the A&P.   LOS: 2 days   Dwyane Dee, MD [facility] 10/05/2022, 4:50 PM

## 2022-10-05 NOTE — Evaluation (Signed)
Occupational Therapy Evaluation Patient Details Name: [name] MRN: [id number] DOB: 04/29/1956 Today's Date: 10/05/2022   History of Present Illness 67 y.o. male presents to [facility] hospital on 10/02/2022 after fall. CT scan demonstrates new L1 compression fx. PMH includes COPD, GERD, HLD, HTN, OSA, tracheostomy 08/10/2021.   Clinical Impression   Pt in bed upon therapy arrival. Pt yelling out to staff in hallway. Pt verbalized that he was worried about the state of his home and seemed to be confused about his location. Was able to re-orient patient to place and situation. Pt did take time to calm down and with encouragement was able to sit on EOB. To prevent resistance of movement, therapist provided step by step sequencing and confirmation of movement with therapy prior to attempting. VC, tactile cues, and visual cues were provided for all movement and activities for sequencing and safety. Required several cues to throughout session to open eyes and look at RW/surroundings. Pt reports that prior to admit, he was living alone and was able to complete ADL tasks independently. He did confirm that he has a RW. Currently, pt presents with increased confusion, pain, and decreased strength, balance, and activity tolerance requiring increased physical assistance to complete BADL tasks and sit to stand tasks. Recommend SNF at discharge to focus on mentioned deficits. Acute OT will continue to follow patient.       Recommendations for follow up therapy are one component of a multi-disciplinary discharge planning process, led by the attending physician.  Recommendations may be updated based on patient status, additional functional criteria and insurance authorization.   Follow Up Recommendations  Skilled nursing-short term rehab (<3 hours/day)     Assistance Recommended at Discharge Frequent or constant Supervision/Assistance  Patient can return home with the following Two people to help with walking  and/or transfers;A lot of help with bathing/dressing/bathroom;Assistance with cooking/housework;Help with stairs or ramp for entrance;Assistance with feeding;Assist for transportation    Functional Status Assessment  Patient has had a recent decline in their functional status and demonstrates the ability to make significant improvements in function in a reasonable and predictable amount of time.  Equipment Recommendations  Other (comment) (defer to next venue)       Precautions / Restrictions Precautions Precautions: Back;Fall Precaution Booklet Issued: Yes (comment) Precaution Comments: back precautions Required Braces or Orthoses: Spinal Brace Spinal Brace: Thoracolumbosacral orthotic Restrictions Weight Bearing Restrictions: No      Mobility Bed Mobility Overal bed mobility: Needs Assistance Bed Mobility: Rolling, Sidelying to Sit, Sit to Sidelying Rolling: Mod assist, +2 for physical assistance Sidelying to sit: Max assist, +2 for physical assistance, +2 for safety/equipment, HOB elevated     Sit to sidelying: Max assist, +2 for physical assistance, +2 for safety/equipment General bed mobility comments: VC, visual cues, and tactile cues provided for proper sequencing of bed mobility in order to decrease resistance and increase pt's participation. While seated on EOB, pt was able to scoot back towards center of bed in addition to [facility] prior to laying back down. Required increased time, increased VC, visual cues, and tactile cues. Provided set-up of right hand on bed to assist with initiation. Patient Response: Anxious, Cooperative  Transfers Overall transfer level: Needs assistance Equipment used: Rolling walker (2 wheels) Transfers: Sit to/from Stand Sit to Stand: Max assist, +2 physical assistance, From elevated surface           General transfer comment: VC's for hand placement and technique. pt completed twice.  Balance Overall balance assessment: Needs  assistance Sitting-balance support: Feet supported, Bilateral upper extremity supported Sitting balance-Leahy Scale: Poor Sitting balance - Comments: Initially pt was very relient on BUE to maintain balance. With increased time and encouragement, pt was able to place both hands on knees and maintain sitting balance with min-mod physical assist. Postural control: Left lateral lean Standing balance support: Bilateral upper extremity supported, During functional activity, Reliant on assistive device for balance Standing balance-Leahy Scale: Poor Standing balance comment: needs UE and external assist       ADL either performed or assessed with clinical judgement   ADL Overall ADL's : Needs assistance/impaired Eating/Feeding: Moderate assistance;Bed level;Cueing for sequencing   Grooming: Wash/dry hands;Wash/dry face;Oral care;Moderate assistance;Sitting   Upper Body Bathing: Maximal assistance;Sitting;Cueing for sequencing   Lower Body Bathing: Total assistance;+2 for physical assistance;Sit to/from stand;Bed level;Cueing for sequencing   Upper Body Dressing : Maximal assistance;Cueing for sequencing;Sitting   Lower Body Dressing: Total assistance;+2 for physical assistance;Bed level;Sit to/from stand   Toilet Transfer:  (unable to safely attempt transfer during session)   Toileting- Clothing Manipulation and Hygiene: Total assistance;+2 for physical assistance;Bed level               Vision Baseline Vision/History: 1 Wears glasses Ability to See in Adequate Light: 1 Impaired (based on large print font on cell phone) Patient Visual Report: No change from baseline Vision Assessment?: Vision impaired- to be further tested in functional context Additional Comments: Visual acuity is questionable. Pt referred to keep his eyes closed during session although opened with verbal cues.            Pertinent Vitals/Pain Pain Assessment Pain Assessment: Faces Faces Pain Scale: Hurts  whole lot Pain Location: back with movement and mobility. Pain Descriptors / Indicators: Moaning, Guarding, Grimacing Pain Intervention(s): Limited activity within patient's tolerance, Monitored during session, Repositioned     Hand Dominance Right   Extremity/Trunk Assessment Upper Extremity Assessment Upper Extremity Assessment: RUE deficits/detail;LUE deficits/detail RUE Deficits / Details: difficult to assess fully due to cognition RUE Coordination: decreased fine motor;decreased gross motor LUE Deficits / Details: difficulty to assess fully due to cognition LUE Coordination: decreased fine motor;decreased gross motor   Lower Extremity Assessment Lower Extremity Assessment: Defer to PT evaluation   Cervical / Trunk Assessment Cervical / Trunk Assessment: Back Surgery   Communication Communication Communication: No difficulties   Cognition Arousal/Alertness: Awake/alert Behavior During Therapy: Restless, Anxious Overall Cognitive Status: No family/caregiver present to determine baseline cognitive functioning Area of Impairment: Orientation, Attention, Memory, Following commands, Safety/judgement, Awareness, Problem solving       Orientation Level: Disoriented to, Place, Time   Memory: Decreased recall of precautions, Decreased short-term memory Following Commands: Follows one step commands with increased time Safety/Judgement: Decreased awareness of safety, Decreased awareness of deficits   Problem Solving: Slow processing, Decreased initiation, Difficulty sequencing, Requires verbal cues, Requires tactile cues General Comments: Pt anxious when therapy arrived ask if his house was ok or if it was in shambles. Was able to re-orient pt to place and situation and he did verbalize retrieval of information. Pt's orientation did flucuated during session. Was able to verbalize understanding of therapist when attempting to re-orient.     General Comments  HR elevated during  session. SpO2 initially dropped into high 80's. No finger monitor placed and SpO2 returning to 90's. BP at start of session (right arm): 122/89            Home Living Family/patient expects to be  discharged to:: Private residence Living Arrangements: Alone Available Help at Discharge: Family;Available PRN/intermittently;Other (Comment) (girlfriend) Type of Home: House Home Access: Stairs to enter [employer] of Steps: 3 Entrance Stairs-Rails: Right;Left;Can reach both Home Layout: One level     Bathroom Shower/Tub: Occupational psychologist: Standard     Home Equipment: Conservation officer, nature (2 wheels)   Additional Comments: due to confusion, unsure on accuracy of information provided during evaluation      Prior Functioning/Environment Prior Level of Function : Patient poor historian/Family not available     ADLs Comments: Pt reports that he was doing everything for himself at home.        OT Problem List: Decreased strength;Decreased cognition;Decreased safety awareness;Decreased activity tolerance;Decreased knowledge of use of DME or AE;Impaired balance (sitting and/or standing);Decreased knowledge of precautions;Pain;Impaired vision/perception;Decreased coordination      OT Treatment/Interventions: Self-care/ADL training;Therapeutic activities;Therapeutic exercise;Neuromuscular education;Cognitive remediation/compensation;Energy conservation;Patient/family education;DME and/or AE instruction;Manual therapy;Balance training;Modalities    OT Goals(Current goals can be found in the care plan section) Acute Rehab OT Goals Patient Stated Goal: to see if home is ok OT Goal Formulation: Patient unable to participate in goal setting Time For Goal Achievement: 10/19/22 Potential to Achieve Goals: Good  OT Frequency: Min 2X/week       AM-PAC OT "6 Clicks" Daily Activity     Outcome Measure Help from another person eating meals?: A Little Help from another  person taking care of personal grooming?: A Lot Help from another person toileting, which includes using toliet, bedpan, or urinal?: Total Help from another person bathing (including washing, rinsing, drying)?: A Lot Help from another person to put on and taking off regular upper body clothing?: A Little Help from another person to put on and taking off regular lower body clothing?: Total 6 Click Score: 12   End of Session Equipment Utilized During Treatment: Gait belt;Rolling walker (2 wheels);Back brace  Activity Tolerance: Patient tolerated treatment well;Patient limited by fatigue Patient left: in bed;with call bell/phone within reach;with bed alarm set  OT Visit Diagnosis: Unsteadiness on feet (R26.81);Muscle weakness (generalized) (M62.81)                Time: 0321-2248 OT Time Calculation (min): 28 min Charges:  OT General Charges $OT Visit: 1 Visit OT Evaluation $OT Eval High Complexity: 1 High  [employer], OTR/L,CBIS  Supplemental OT - [facility] and [facility] Secure Chat Preferred    Corday Wyka, Clarene Duke 10/05/2022, 11:35 AM

## 2022-10-05 NOTE — Plan of Care (Signed)

## 2022-10-05 NOTE — NC FL2 (Signed)
[place] MEDICAID FL2 LEVEL OF CARE FORM     IDENTIFICATION  Patient Name: [name] Birthdate: 03-Mar-1956 Sex: male Admission Date (Current Location): 10/02/2022  [facility] and [place] Number:  Producer, television/film/video and Address:  The . [facility], [address] [place], [place] [address]      Provider Number: [id number]  Attending Physician Name and Address:  Lewie Chamber, MD  Relative Name and Phone Number:  Dian Situ Daughter   [phone]    Current Level of Care: Hospital Recommended Level of Care: Skilled Nursing Facility Prior Approval Number:    Date Approved/Denied:   PASRR Number: [id number]  Discharge Plan: SNF    Current Diagnoses: Patient Active Problem List   Diagnosis Date Noted   Alcohol use 10/02/2022   L1 vertebral fracture (HCC) 10/02/2022   Closed compression fracture of body of L1 vertebra (HCC) 10/02/2022   COPD with chronic bronchitis and emphysema (HCC) 07/15/2022   CAP (community acquired pneumonia) 06/23/2022   History of retinal detachment 05/05/2022   Left epiretinal membrane 04/05/2022   Dermatochalasis of both upper eyelids 03/14/2022   Nocturnal hypoxemia 02/07/2022   Acute on chronic respiratory failure with hypoxia (HCC) 09/08/2021   Traumatic fracture of ribs of right side with pneumothorax 09/08/2021   History of alcohol abuse 09/08/2021   Delirium due to another medical condition 09/08/2021   Healthcare associated bacterial pneumonia 09/08/2021   Anxiety disorder due to known physiological condition    Fall 07/27/2021   Screening for prostate cancer 01/27/2021   Elevated LFTs 01/27/2021   Former smoker 01/27/2021   Chronic respiratory failure with hypoxia (HCC) 11/08/2020   Obstructive sleep apnea hypopnea, severe 09/24/2020   Exudative age-related macular degeneration of left eye with active choroidal neovascularization (HCC) 01/28/2020   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    Exudative age-related macular degeneration of right eye with active choroidal neovascularization (HCC) 01/28/2020   CAD (coronary artery disease) 10/16/2019   S/P reverse total shoulder arthroplasty, left 09/23/2019   Overweight (BMI 25.0-29.9) 08/13/2019   Hyperlipidemia 08/13/2019   Essential hypertension 08/13/2019   Gastroesophageal reflux disease 08/13/2019   Macular degeneration, wet (HCC) 11/09/2017   Lung nodules 10/28/2015   COPD with acute exacerbation (HCC) 10/01/2014   Chest x-ray abnormality 10/01/2014    Orientation RESPIRATION BLADDER Height & Weight     Self  O2 Incontinent Weight: 230 lb (104.3 kg) Height:  6\' 2"  (188 cm)  BEHAVIORAL SYMPTOMS/MOOD NEUROLOGICAL BOWEL NUTRITION STATUS      Continent Diet (see discharge summary)  AMBULATORY STATUS COMMUNICATION OF NEEDS Skin   Limited Assist Verbally Skin abrasions                       Personal Care Assistance Level of Assistance  Bathing, Feeding, Dressing, Total care Bathing Assistance: Maximum assistance Feeding assistance: Limited assistance Dressing Assistance: Maximum assistance Total Care Assistance: Maximum assistance   Functional Limitations Info  Sight, Hearing, Speech Sight Info: Adequate Hearing Info: Adequate Speech Info: Adequate    SPECIAL CARE FACTORS FREQUENCY  PT (By licensed PT), OT (By licensed OT)     PT Frequency: 5x week OT Frequency: 5x week            Contractures Contractures Info: Not present    Additional Factors Info  Code Status, Allergies Code Status Info: full Allergies Info: NKA           Current Medications (10/05/2022):  This is  the current hospital active medication list Current Facility-Administered Medications  Medication Dose Route Frequency Provider Last Rate Last Admin   0.9 %  sodium chloride infusion   Intravenous Continuous Dwyane Dee, MD 100 mL/hr at 10/05/22 1208 New Bag at 10/05/22 1208   albuterol (PROVENTIL) (2.5 MG/3ML) 0.083%  nebulizer solution 2.5 mg  2.5 mg Nebulization Q6H PRN Tu, Ching T, DO   2.5 mg at 10/03/22 1957   alum & mag hydroxide-simeth (MAALOX/MYLANTA) 200-200-20 MG/5ML suspension 30 mL  30 mL Oral Q4H PRN Dwyane Dee, MD       atorvastatin (LIPITOR) tablet 40 mg  40 mg Oral Daily Tu, Ching T, DO   40 mg at 10/05/22 0830   bisacodyl (DULCOLAX) suppository 10 mg  10 mg Rectal Daily PRN Aline August, MD       budesonide (PULMICORT) nebulizer solution 0.5 mg  0.5 mg Nebulization BID Starla Link, Kshitiz, MD   0.5 mg at 10/05/22 0840   enoxaparin (LOVENOX) injection 40 mg  40 mg Subcutaneous Daily Tu, Ching T, DO   40 mg at 70/35/00 9381   folic acid (FOLVITE) tablet 1 mg  1 mg Oral Daily Phyllis Ginger, MD   1 mg at 10/05/22 0830   guaiFENesin (MUCINEX) 12 hr tablet 1,200 mg  1,200 mg Oral BID Aline August, MD   1,200 mg at 10/05/22 0830   guaiFENesin-dextromethorphan (ROBITUSSIN DM) 100-10 MG/5ML syrup 5 mL  5 mL Oral Q4H PRN Aline August, MD   5 mL at 10/05/22 0558   HYDROmorphone (DILAUDID) injection 1 mg  1 mg Intravenous Q3H PRN Tu, Ching T, DO   1 mg at 10/03/22 2131   ibuprofen (ADVIL) tablet 800 mg  800 mg Oral Q6H PRN Tu, Ching T, DO       ipratropium-albuterol (DUONEB) 0.5-2.5 (3) MG/3ML nebulizer solution 3 mL  3 mL Nebulization TID Starla Link, Kshitiz, MD   3 mL at 10/05/22 1434   lactulose (CHRONULAC) 10 GM/15ML solution 20 g  20 g Oral BID PRN Dwyane Dee, MD       lidocaine (LIDODERM) 5 % 1 patch  1 patch Transdermal QHS Tu, Ching T, DO   1 patch at 10/04/22 2015   lisinopril (ZESTRIL) tablet 10 mg  10 mg Oral Daily Alekh, Kshitiz, MD   10 mg at 10/05/22 0830   LORazepam (ATIVAN) tablet 1-4 mg  1-4 mg Oral Q1H PRN Phyllis Ginger, MD   2 mg at 10/03/22 1956   Or   LORazepam (ATIVAN) injection 1-4 mg  1-4 mg Intravenous Q1H PRN Phyllis Ginger, MD   1 mg at 10/05/22 1530   methocarbamol (ROBAXIN) tablet 500 mg  500 mg Oral Q6H PRN Aline August, MD   500 mg at 10/04/22 0810   metoprolol tartrate  (LOPRESSOR) injection 2.5 mg  2.5 mg Intravenous Q6H PRN Aline August, MD   2.5 mg at 10/04/22 2009   multivitamin with minerals tablet 1 tablet  1 tablet Oral Daily Phyllis Ginger, MD   1 tablet at 10/05/22 0830   oxyCODONE-acetaminophen (PERCOCET/ROXICET) 5-325 MG per tablet 1 tablet  1 tablet Oral Q4H PRN Tu, Ching T, DO   1 tablet at 10/04/22 2009   pantoprazole (PROTONIX) EC tablet 40 mg  40 mg Oral Daily Dwyane Dee, MD   40 mg at 10/05/22 1208   polyethylene glycol (MIRALAX / GLYCOLAX) packet 17 g  17 g Oral Daily PRN Aline August, MD   17 g at 10/04/22 1306   roflumilast (DALIRESP) tablet  500 mcg  500 mcg Oral Daily Tu, Ching T, DO   500 mcg at 10/05/22 0830   senna-docusate (Senokot-S) tablet 1 tablet  1 tablet Oral BID Aline August, MD   1 tablet at 10/05/22 0830   tamsulosin (FLOMAX) capsule 0.4 mg  0.4 mg Oral Daily Tu, Ching T, DO   0.4 mg at 10/05/22 0354   theophylline (UNIPHYL) 400 MG 24 hr tablet 400 mg  400 mg Oral Daily Tu, Ching T, DO   400 mg at 10/05/22 0831   thiamine (VITAMIN B1) tablet 100 mg  100 mg Oral Daily Phyllis Ginger, MD   100 mg at 10/05/22 0830   Or   thiamine (VITAMIN B1) injection 100 mg  100 mg Intravenous Daily Phyllis Ginger, MD         Discharge Medications: Please see discharge summary for a list of discharge medications.  Relevant Imaging Results:  Relevant Lab Results:   Additional Information SSN: [id number]  Joanne Chars, LCSW

## 2022-10-05 NOTE — TOC Initial Note (Signed)
Transition of Care [facility]) - Initial/Assessment Note    Patient Details  Name: [name] MRN: [id number] Date of Birth: 27-Mar-1956  [facility] CM/SW Contact:    Charles Chars, LCSW Phone Number: 10/05/2022, 3:59 PM  Clinical Narrative:    Pt oriented x1.  Pt did respond to CSW questions regarding DC plan and stated he was agreeable to SNF rehab.  Pt daughter [name] also in room and CSW primarily communicated with her for assessment info.  Pt lives alone, does have sister living next door and girlfriend nearby.  Pt was at [facility] recently, discussed SNF recommendation and daughter also in agreement with SNF plan at DC.  Referral sent out in hub for SNF.               Expected Discharge Plan: Skilled Nursing Facility Barriers to Discharge: Continued Medical Work up, SNF Pending bed offer   Patient Goals and CMS Choice   CMS Medicare.gov Compare Post Acute Care list provided to:: Patient Represenative (must comment) (daughter [name]) Choice offered to / list presented to : Adult Children (daughter [name])      Expected Discharge Plan and Services In-house Referral: Clinical Social Work   Post Acute Care Choice: [employer] Living arrangements for the past 2 months: [place]                                      Prior Living Arrangements/Services Living arrangements for the past 2 months: Single Family Home Lives with:: Self Patient language and need for interpreter reviewed:: Yes        Need for Family Participation in Patient Care: Yes (Comment) Care giver support system in place?: Yes (comment) Current home services: Other (comment) (none) Criminal Activity/Legal Involvement Pertinent to Current Situation/Hospitalization: No - Comment as needed  Activities of Daily Living Home Assistive Devices/Equipment: Eyeglasses, CPAP, Oxygen ADL Screening (condition at time of admission) Patient's cognitive ability adequate to  safely complete daily activities?: Yes Is the patient deaf or have difficulty hearing?: No Does the patient have difficulty seeing, even when wearing glasses/contacts?: No Does the patient have difficulty concentrating, remembering, or making decisions?: No Patient able to express need for assistance with ADLs?: Yes Does the patient have difficulty dressing or bathing?: No Independently performs ADLs?: Yes (appropriate for developmental age) Does the patient have difficulty walking or climbing stairs?: No Weakness of Legs: None Weakness of Arms/Hands: None  Permission Sought/Granted                  Emotional Assessment Appearance:: Appears stated age Attitude/Demeanor/Rapport: Unable to Assess Affect (typically observed): Unable to Assess Orientation: : Oriented to Self      Admission diagnosis:  L1 vertebral fracture ([place]) [S32.019A] Fall, initial encounter [W19.XXXA] Closed compression fracture of body of L1 vertebra ([place]) [S32.010A] Patient Active Problem List   Diagnosis Date Noted   Alcohol use 10/02/2022   L1 vertebral fracture ([place]) 10/02/2022   Closed compression fracture of body of L1 vertebra ([place]) 10/02/2022   COPD with chronic bronchitis and emphysema ([place]) 07/15/2022   CAP (community acquired pneumonia) 06/23/2022   History of retinal detachment 05/05/2022   Left epiretinal membrane 04/05/2022   Dermatochalasis of both upper eyelids 03/14/2022   Nocturnal hypoxemia 02/07/2022   Acute on chronic respiratory failure with hypoxia ([place]) 09/08/2021   Traumatic fracture of ribs of right side  with pneumothorax 09/08/2021   History of alcohol abuse 09/08/2021   Delirium due to another medical condition 09/08/2021   Healthcare associated bacterial pneumonia 09/08/2021   Anxiety disorder due to known physiological condition    Fall 07/27/2021   Screening for prostate cancer 01/27/2021   Elevated LFTs 01/27/2021   Former smoker 01/27/2021   Chronic respiratory  failure with hypoxia ([place]) 11/08/2020   Obstructive sleep apnea hypopnea, severe 09/24/2020   Exudative age-related macular degeneration of left eye with active choroidal neovascularization ([place]) 01/28/2020   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   Exudative age-related macular degeneration of right eye with active choroidal neovascularization ([place]) 01/28/2020   CAD (coronary artery disease) 10/16/2019   S/P reverse total shoulder arthroplasty, left 09/23/2019   Overweight (BMI 25.0-29.9) 08/13/2019   Hyperlipidemia 08/13/2019   Essential hypertension 08/13/2019   Gastroesophageal reflux disease 08/13/2019   Macular degeneration, wet ([place]) 11/09/2017   Lung nodules 10/28/2015   COPD with acute exacerbation ([place]) 10/01/2014   Chest x-ray abnormality 10/01/2014   PCP:  Charles Critchley, MD Pharmacy:   [place] [place] [address] Phone: [phone] Fax: [phone]     Social Determinants of Health (SDOH) Social History: SDOH Screenings   Food Insecurity: No Food Insecurity (10/03/2022)  Housing: Low Risk  (10/03/2022)  Transportation Needs: No Transportation Needs (10/03/2022)  Utilities: Not At Risk (10/03/2022)  Depression (PHQ2-9): Low Risk  (01/27/2021)  Tobacco Use: Medium Risk (10/02/2022)   SDOH Interventions:     Readmission Risk Interventions     No data to display

## 2022-10-06 DIAGNOSIS — S32010A Wedge compression fracture of first lumbar vertebra, initial encounter for closed fracture: Secondary | ICD-10-CM | POA: Diagnosis not present

## 2022-10-06 DIAGNOSIS — F10939 Alcohol use, unspecified with withdrawal, unspecified: Secondary | ICD-10-CM | POA: Insufficient documentation

## 2022-10-06 DIAGNOSIS — F10931 Alcohol use, unspecified with withdrawal delirium: Secondary | ICD-10-CM

## 2022-10-06 DIAGNOSIS — S32018A Other fracture of first lumbar vertebra, initial encounter for closed fracture: Secondary | ICD-10-CM | POA: Diagnosis not present

## 2022-10-06 DIAGNOSIS — F1090 Alcohol use, unspecified, uncomplicated: Secondary | ICD-10-CM | POA: Diagnosis not present

## 2022-10-06 LAB — CBC WITH DIFFERENTIAL/PLATELET
Abs Immature Granulocytes: 0.03 K/uL (ref 0.00–0.07)
Basophils Absolute: 0 K/uL (ref 0.0–0.1)
Basophils Relative: 0 %
Eosinophils Absolute: 0 K/uL (ref 0.0–0.5)
Eosinophils Relative: 0 %
HCT: 33.9 % — ABNORMAL LOW (ref 39.0–52.0)
Hemoglobin: 12.1 g/dL — ABNORMAL LOW (ref 13.0–17.0)
Immature Granulocytes: 0 %
Lymphocytes Relative: 11 %
Lymphs Abs: 0.8 K/uL (ref 0.7–4.0)
MCH: 35.1 pg — ABNORMAL HIGH (ref 26.0–34.0)
MCHC: 35.7 g/dL (ref 30.0–36.0)
MCV: 98.3 fL (ref 80.0–100.0)
Monocytes Absolute: 0.8 K/uL (ref 0.1–1.0)
Monocytes Relative: 11 %
Neutro Abs: 5.7 K/uL (ref 1.7–7.7)
Neutrophils Relative %: 78 %
Platelets: 152 K/uL (ref 150–400)
RBC: 3.45 MIL/uL — ABNORMAL LOW (ref 4.22–5.81)
RDW: 12.4 % (ref 11.5–15.5)
WBC: 7.4 K/uL (ref 4.0–10.5)
nRBC: 0 % (ref 0.0–0.2)

## 2022-10-06 LAB — COMPREHENSIVE METABOLIC PANEL
ALT: 29 U/L (ref 0–44)
AST: 30 U/L (ref 15–41)
Albumin: 3.5 g/dL (ref 3.5–5.0)
Alkaline Phosphatase: 88 U/L (ref 38–126)
Anion gap: 8 (ref 5–15)
BUN: 17 mg/dL (ref 8–23)
CO2: 23 mmol/L (ref 22–32)
Calcium: 8.9 mg/dL (ref 8.9–10.3)
Chloride: 106 mmol/L (ref 98–111)
Creatinine, Ser: 0.77 mg/dL (ref 0.61–1.24)
GFR, Estimated: >60 mL/min (ref >60)
Glucose, Bld: 111 mg/dL — ABNORMAL HIGH (ref 70–99)
Potassium: 3.8 mmol/L (ref 3.5–5.1)
Sodium: 137 mmol/L (ref 135–145)
Total Bilirubin: 0.6 mg/dL (ref 0.3–1.2)
Total Protein: 6.5 g/dL (ref 6.5–8.1)

## 2022-10-06 LAB — MAGNESIUM: Magnesium: 2 mg/dL (ref 1.7–2.4)

## 2022-10-06 MED ORDER — LORAZEPAM 2 MG/ML IJ SOLN
1.0000 mg | INTRAMUSCULAR | Status: DC | PRN
  Administered 2022-10-07 – 2022-10-08 (×4): 2 mg via INTRAVENOUS
  Administered 2022-10-08 (×3): 4 mg via INTRAVENOUS
  Administered 2022-10-08 – 2022-10-10 (×6): 2 mg via INTRAVENOUS
  Filled 2022-10-06 (×6): qty 1
  Filled 2022-10-06: qty 2
  Filled 2022-10-06: qty 1
  Filled 2022-10-06: qty 2
  Filled 2022-10-06 (×3): qty 1
  Filled 2022-10-06: qty 2

## 2022-10-06 MED ORDER — LORAZEPAM 1 MG PO TABS
1.0000 mg | ORAL_TABLET | ORAL | Status: DC | PRN
  Administered 2022-10-07 – 2022-10-11 (×3): 1 mg via ORAL
  Filled 2022-10-06 (×3): qty 1

## 2022-10-06 NOTE — Plan of Care (Signed)

## 2022-10-06 NOTE — Progress Notes (Signed)
Progress Note    [name]   DVV:[id number]  DOB: [birth year]  DOA: 10/02/2022     3 PCP: Macarthur Critchley, MD  Initial CC: fall at home  Hospital Course: [name] is a 67 yo male with PMH chronic alcohol use, chronic back pain, COPD, chronic hypoxic respiratory failure (on 2 L with CPAP and PRN), OSA on CPAP, HTN, CAD, macular degeneration.  He presented after tripping over oxygen tubing at home and was brought in due to worsening back pain. He underwent imaging of T and L-spine which showed acute L1 compression fracture.  He also has known chronic thoracic compression fractures, chronic pubic rami fractures, right iliac bone fractures, and multiple old right rib fractures.  Case was discussed with neurosurgery and he was recommended for TLSO bracing for comfort when out of bed and outpatient follow-up. He began experiencing alcohol withdrawal soon after admission and was placed on CIWA protocol.  Interval History:  No events overnight.  Patient improving more since yesterday.  CIWA scores have significantly down trended and patient is more conversant and awake this morning.  Did not eat much breakfast but plans to eat better lunch. Daughter present bedside and updated.  Assessment and Plan:  Acute L1 compression fracture - due to mechanical fall at home (tripped over O2 tubing); patient not felt to be intoxicated upon initial evaluation by response teams -Discussed with neurosurgery, recommended for TLSO brace when out of bed for comfort - Outpatient follow-up with neurosurgery - Continue working with PT/OT - Likely needs SNF   Alcohol use disorder - Per daughter, patient drinks possibly up to 20 beers per day.  Patient does underreport usage - CIWA starting to downtrend -Continue thiamine, folate, multivitamin  Acute urinary retention - Suspect due to overall deconditioning and worsening back pain with L1 compression fracture.  -Continue bladder scanning - s/p straight  cath this afternoon, ~900 cc voided    Thrombocytopenia -Suspect bone marrow suppression due to chronic alcohol use   Hypertension -Continue lisinopril - PRN labetalol or hydralazine ordered as well   Hyperlipidemia -Continue statin   COPD Chronic respiratory failure with hypoxia -Uses 2 to 3 L oxygen at night normally at home.   -Some concern for exacerbation on admission and steroids were initiated.  No significant wheezing appreciated today - DC steroids and monitor clinically again - Continue breathing treatments and nebulizers   Old records reviewed in assessment of this patient  Antimicrobials:   DVT prophylaxis:  enoxaparin (LOVENOX) injection 40 mg Start: 10/03/22 1000   Code Status:   Code Status: Full Code  Mobility Assessment (last 72 hours)     Mobility Assessment     Row Name 10/06/22 1030 10/06/22 0745 10/05/22 2120 10/05/22 1133 10/05/22 0900   Does patient have an order for bedrest or is patient medically unstable -- No - Continue assessment No - Continue assessment -- No - Continue assessment   What is the highest level of mobility based on the progressive mobility assessment? Level 3 (Stands with assist) - Balance while standing  and cannot march in place Level 2 (Chairfast) - Balance while sitting on edge of bed and cannot stand Level 3 (Stands with assist) - Balance while standing  and cannot march in place Level 3 (Stands with assist) - Balance while standing  and cannot march in place Level 3 (Stands with assist) - Balance while standing  and cannot march in place   Is the above level different from baseline  mobility prior to current illness? -- Yes - Recommend PT order Yes - Recommend PT order -- Yes - Recommend PT order    Row Name 10/04/22 1959 10/04/22 1225 10/04/22 0810 10/03/22 1954 10/03/22 1635   Does patient have an order for bedrest or is patient medically unstable No - Continue assessment -- No - Continue assessment No - Continue assessment No  - Continue assessment   What is the highest level of mobility based on the progressive mobility assessment? Level 1 (Bedfast) - Unable to balance while sitting on edge of bed Level 3 (Stands with assist) - Balance while standing  and cannot march in place Level 4 (Walks with assist in room) - Balance while marching in place and cannot step forward and back - Complete Level 6 (Walks independently in room and hall) - Balance while walking in room without assist - Complete --   Is the above level different from baseline mobility prior to current illness? No - Consider discontinuing PT/OT -- Yes - Recommend PT order -- Yes - Recommend PT order            Barriers to discharge:  Disposition Plan:  pending PT eval Status is: Inpt  Objective: Blood pressure 113/74, pulse 98, temperature 98 F (36.7 C), resp. rate 19, height 6\' 2"  (1.88 m), weight 104.3 kg, SpO2 96 %.  Examination:  Physical Exam Constitutional:      General: He is not in acute distress.    Appearance: He is obese. He is not ill-appearing.  HENT:     Head: Normocephalic and atraumatic.     Mouth/Throat:     Mouth: Mucous membranes are moist.  Eyes:     Extraocular Movements: Extraocular movements intact.  Cardiovascular:     Rate and Rhythm: Normal rate and regular rhythm.     Heart sounds: Normal heart sounds.  Pulmonary:     Effort: Pulmonary effort is normal. No respiratory distress.     Comments: Very faint scattered expiratory wheezing noted Abdominal:     General: Bowel sounds are normal. There is no distension.     Palpations: Abdomen is soft.     Tenderness: There is no abdominal tenderness.  Musculoskeletal:        General: Normal range of motion.     Cervical back: Normal range of motion and neck supple.  Skin:    General: Skin is warm and dry.  Neurological:     General: No focal deficit present.     Mental Status: He is oriented to person, place, and time.     Comments: Tremor appreciated in  bilateral upper extremities  Psychiatric:        Mood and Affect: Mood normal.        Behavior: Behavior normal.      Consultants:    Procedures:    Data Reviewed: Results for orders placed or performed during the hospital encounter of 10/02/22 (from the past 24 hour(s))  CBC with Differential/Platelet     Status: Abnormal   Collection Time: 10/06/22  5:57 AM  Result Value Ref Range   WBC 7.4 4.0 - 10.5 K/uL   RBC 3.45 (L) 4.22 - 5.81 MIL/uL   Hemoglobin 12.1 (L) 13.0 - 17.0 g/dL   HCT 10/08/22 (L) 83.3 - 82.5 %   MCV 98.3 80.0 - 100.0 fL   MCH 35.1 (H) 26.0 - 34.0 pg   MCHC 35.7 30.0 - 36.0 g/dL   RDW 05.3 97.6 - 73.4 %  Platelets 152 150 - 400 K/uL   nRBC 0.0 0.0 - 0.2 %   Neutrophils Relative % 78 %   Neutro Abs 5.7 1.7 - 7.7 K/uL   Lymphocytes Relative 11 %   Lymphs Abs 0.8 0.7 - 4.0 K/uL   Monocytes Relative 11 %   Monocytes Absolute 0.8 0.1 - 1.0 K/uL   Eosinophils Relative 0 %   Eosinophils Absolute 0.0 0.0 - 0.5 K/uL   Basophils Relative 0 %   Basophils Absolute 0.0 0.0 - 0.1 K/uL   Immature Granulocytes 0 %   Abs Immature Granulocytes 0.03 0.00 - 0.07 K/uL  Comprehensive metabolic panel     Status: Abnormal   Collection Time: 10/06/22  5:57 AM  Result Value Ref Range   Sodium 137 135 - 145 mmol/L   Potassium 3.8 3.5 - 5.1 mmol/L   Chloride 106 98 - 111 mmol/L   CO2 23 22 - 32 mmol/L   Glucose, Bld 111 (H) 70 - 99 mg/dL   BUN 17 8 - 23 mg/dL   Creatinine, Ser 0.77 0.61 - 1.24 mg/dL   Calcium 8.9 8.9 - 10.3 mg/dL   Total Protein 6.5 6.5 - 8.1 g/dL   Albumin 3.5 3.5 - 5.0 g/dL   AST 30 15 - 41 U/L   ALT 29 0 - 44 U/L   Alkaline Phosphatase 88 38 - 126 U/L   Total Bilirubin 0.6 0.3 - 1.2 mg/dL   GFR, Estimated >60 >60 mL/min   Anion gap 8 5 - 15  Magnesium     Status: None   Collection Time: 10/06/22  5:57 AM  Result Value Ref Range   Magnesium 2.0 1.7 - 2.4 mg/dL    I have reviewed pertinent nursing notes, vitals, labs, and images as necessary. I  have ordered labwork to follow up on as indicated.  I have reviewed the last notes from staff over past 24 hours. I have discussed patient's care plan and test results with nursing staff, CM/SW, and other staff as appropriate.  Time spent: Greater than 50% of the 55 minute visit was spent in counseling/coordination of care for the patient as laid out in the A&P.   LOS: 3 days   Dwyane Dee, MD [facility] 10/06/2022, 3:24 PM

## 2022-10-06 NOTE — Progress Notes (Signed)
Mobility Specialist Progress Note   10/06/22 1200  Mobility  Activity Moved into chair position in bed  Level of Assistance +2 (takes two people)  Assistive Device  (HHA)  Activity Response Tolerated fair  Mobility Referral Yes  $Mobility charge 1 Mobility   Received pt in bed sunken down in bed and requesting to be readjusted. Once positioned up in bed, placed pt in chair position but unable to tolerated position long d/t past hip pain according to daughter in room. Left w/ pt in semi chair position w/ IV team present in room.   Holland Falling Mobility Specialist Please contact via SecureChat or  Rehab office at [phone]

## 2022-10-06 NOTE — Progress Notes (Signed)
Physical Therapy Treatment Patient Details Name: JOHNATAN BASKETTE III MRN: [id number] DOB: Feb 19, 1956 Today's Date: 10/06/2022   History of Present Illness 67 y.o. male presents to [facility] hospital on 10/02/2022 after fall. CT scan demonstrates new L1 compression fx. PMH includes COPD, GERD, HLD, HTN, OSA, tracheostomy 08/10/2021.    PT Comments    Pt is progressing slowly toward goals. Pt has no re-call of education for back precautions or log rolling currently. Requires Moderate assistance for most functional activities and was unable to progress gait further than side steps at EOB this session due to pain and impulsivity. Due to pt current functional status, available assistance at home and home set up recommending skilled physical therapy services in SNF setting on discharge from acute care hospital setting in order to decrease risk for immobility, falls, injury and re-hospitalization.    Recommendations for follow up therapy are one component of a multi-disciplinary discharge planning process, led by the attending physician.  Recommendations may be updated based on patient status, additional functional criteria and insurance authorization.  Follow Up Recommendations  Skilled nursing-short term rehab (<3 hours/day) Can patient physically be transported by private vehicle: No   Assistance Recommended at Discharge Frequent or constant Supervision/Assistance  Patient can return home with the following Two people to help with walking and/or transfers;Assistance with cooking/housework;Assist for transportation;Help with stairs or ramp for entrance   Equipment Recommendations  Rolling walker (2 wheels);Other (comment) (defer to post acute)    Recommendations for Other Services       Precautions / Restrictions Precautions Precautions: Back;Fall Precaution Comments: back precautions Required Braces or Orthoses: Spinal Brace Spinal Brace: Thoracolumbosacral orthotic Restrictions Weight Bearing  Restrictions: No     Mobility  Bed Mobility Overal bed mobility: Needs Assistance Bed Mobility: Rolling, Sidelying to Sit, Sit to Sidelying Rolling: Min assist Sidelying to sit: Mod assist     Sit to sidelying: Mod assist General bed mobility comments: Pt requires Mod A at the trunk for side lying to sitting and Mod A at the LE for Sitting to Side lying. Pt requires Min A with verbal cues for sequencing with log roll in bed in order to maintain back precautions with no re-call on log roll technique. Patient Response: Cooperative  Transfers Overall transfer level: Needs assistance Equipment used: Rolling walker (2 wheels) Transfers: Sit to/from Stand Sit to Stand: Mod assist           General transfer comment: Verbal cues for sequencing and for safe standing. Pt had to be directed to not stand multiple times before lines were set up or pt was prepared to stand.    Ambulation/Gait Ambulation/Gait assistance: Min guard Gait Distance (Feet): 2 Feet Assistive device: Rolling walker (2 wheels)   Gait velocity: decreased cadence Gait velocity interpretation: <1.31 ft/sec, indicative of household ambulator   General Gait Details: Pt took side step to [facility], did not progress further due to pain and impulsivity pt would sit without warning. Min A with assistance wtih AD for navigation due to difficulty; once AD moved pt would follow with Min A for balance.       Balance Overall balance assessment: Needs assistance Sitting-balance support: Feet supported, Bilateral upper extremity supported Sitting balance-Leahy Scale: Fair Sitting balance - Comments: Pt was using bil UE to off load LB once brace donned continued.   Standing balance support: Bilateral upper extremity supported, During functional activity, Reliant on assistive device for balance Standing balance-Leahy Scale: Fair Standing balance comment: Need UE assist and  Min A to remain upright.        Cognition  Arousal/Alertness: Awake/alert Behavior During Therapy: Impulsive Overall Cognitive Status: No family/caregiver present to determine baseline cognitive functioning Area of Impairment: Safety/judgement     Orientation Level: Disoriented to, Place, Time Current Attention Level: Focused Memory: Decreased recall of precautions, Decreased short-term memory Following Commands: Follows one step commands with increased time Safety/Judgement: Decreased awareness of safety, Decreased awareness of deficits     General Comments: Pt speaking randomly on topics that were not being addressed at the moment.           General Comments General comments (skin integrity, edema, etc.): HR up to 130 bpm throughout session and O2 sats remained above 90% on 5 L O2 via [place]. Pt is impulsive and unaware of limitations. He keeps his eyes closed and tends to hold his breathe. He needs to be reminded to open his eyes and breathe.      Pertinent Vitals/Pain Pain Assessment Pain Assessment: Faces Faces Pain Scale: Hurts whole lot Pain Location: back with movement and mobility. Pain Descriptors / Indicators: Moaning, Guarding, Grimacing Pain Intervention(s): Limited activity within patient's tolerance, Monitored during session     PT Goals (current goals can now be found in the care plan section) Acute Rehab PT Goals Patient Stated Goal: return home PT Goal Formulation: With patient Time For Goal Achievement: 10/18/22 Potential to Achieve Goals: Good Progress towards PT goals: Progressing toward goals    Frequency    Min 3X/week      PT Plan Current plan remains appropriate       AM-PAC PT "6 Clicks" Mobility   Outcome Measure  Help needed turning from your back to your side while in a flat bed without using bedrails?: A Little Help needed moving from lying on your back to sitting on the side of a flat bed without using bedrails?: A Little Help needed moving to and from a bed to a chair  (including a wheelchair)?: A Lot Help needed standing up from a chair using your arms (e.g., wheelchair or bedside chair)?: A Lot Help needed to walk in hospital room?: Total Help needed climbing 3-5 steps with a railing? : Total 6 Click Score: 12    End of Session Equipment Utilized During Treatment: Gait belt;Back brace Activity Tolerance: Patient tolerated treatment well;Patient limited by pain;Other (comment) (impulsive) Patient left: in bed;with call bell/phone within reach;with bed alarm set Nurse Communication: Mobility status PT Visit Diagnosis: Unsteadiness on feet (R26.81);Difficulty in walking, not elsewhere classified (R26.2);Pain Pain - part of body:  (LB)     Time: 3419-3790 PT Time Calculation (min) (ACUTE ONLY): 30 min  Charges:  $Therapeutic Activity: 23-37 mins                     Tomma Rakers, DPT, CLT  Acute Rehabilitation Services Office: [phone] (Secure chat preferred)    Ander Purpura 10/06/2022, 10:33 AM

## 2022-10-06 NOTE — TOC Progression Note (Signed)
Transition of Care [facility]) - Progression Note    Patient Details  Name: [name] MRN: [id number] Date of Birth: 1956-02-13  [facility]) CM/SW Contact  Joanne Chars, LCSW Phone Number: 10/06/2022, 3:54 PM  Clinical Narrative:   CSW spoke with pt daughter [name] by phone and provided bed offers.  She is requesting response from [facility].  CSW reached out to Deborah/[facility].      Expected Discharge Plan: [place] Barriers to Discharge: Continued Medical Work up, SNF Pending bed offer  Expected Discharge Plan and Services In-house Referral: Clinical Social Work   Post Acute Care Choice: [place] Living arrangements for the past 2 months: Single Family Home                                       Social Determinants of Health (SDOH) Interventions SDOH Screenings   Food Insecurity: No Food Insecurity (10/03/2022)  Housing: Low Risk  (10/03/2022)  Transportation Needs: No Transportation Needs (10/03/2022)  Utilities: Not At Risk (10/03/2022)  Depression (PHQ2-9): Low Risk  (01/27/2021)  Tobacco Use: Medium Risk (10/02/2022)    Readmission Risk Interventions     No data to display

## 2022-10-06 NOTE — Plan of Care (Signed)

## 2022-10-06 NOTE — Care Management Important Message (Signed)
Important Message  Patient Details  Name: [name] MRN: [id number] Date of Birth: 1955-11-22   Medicare Important Message Given:  Yes     Hannah Beat 10/06/2022, 1:16 PM

## 2022-10-07 DIAGNOSIS — F10931 Alcohol use, unspecified with withdrawal delirium: Secondary | ICD-10-CM | POA: Diagnosis not present

## 2022-10-07 DIAGNOSIS — F1090 Alcohol use, unspecified, uncomplicated: Secondary | ICD-10-CM | POA: Diagnosis not present

## 2022-10-07 DIAGNOSIS — S32010A Wedge compression fracture of first lumbar vertebra, initial encounter for closed fracture: Secondary | ICD-10-CM | POA: Diagnosis not present

## 2022-10-07 LAB — CBC WITH DIFFERENTIAL/PLATELET
Abs Immature Granulocytes: 0.03 10*3/uL (ref 0.00–0.07)
Basophils Absolute: 0 10*3/uL (ref 0.0–0.1)
Basophils Relative: 0 %
Eosinophils Absolute: 0.1 10*3/uL (ref 0.0–0.5)
Eosinophils Relative: 1 %
HCT: 35.1 % — ABNORMAL LOW (ref 39.0–52.0)
Hemoglobin: 11.9 g/dL — ABNORMAL LOW (ref 13.0–17.0)
Immature Granulocytes: 1 %
Lymphocytes Relative: 17 %
Lymphs Abs: 0.9 10*3/uL (ref 0.7–4.0)
MCH: 33.9 pg (ref 26.0–34.0)
MCHC: 33.9 g/dL (ref 30.0–36.0)
MCV: 100 fL (ref 80.0–100.0)
Monocytes Absolute: 0.7 10*3/uL (ref 0.1–1.0)
Monocytes Relative: 13 %
Neutro Abs: 3.5 10*3/uL (ref 1.7–7.7)
Neutrophils Relative %: 68 %
Platelets: 153 10*3/uL (ref 150–400)
RBC: 3.51 MIL/uL — ABNORMAL LOW (ref 4.22–5.81)
RDW: 12.7 % (ref 11.5–15.5)
WBC: 5.2 10*3/uL (ref 4.0–10.5)
nRBC: 0 % (ref 0.0–0.2)

## 2022-10-07 LAB — COMPREHENSIVE METABOLIC PANEL
ALT: 28 U/L (ref 0–44)
AST: 33 U/L (ref 15–41)
Albumin: 3.4 g/dL — ABNORMAL LOW (ref 3.5–5.0)
Alkaline Phosphatase: 88 U/L (ref 38–126)
Anion gap: 8 (ref 5–15)
BUN: 14 mg/dL (ref 8–23)
CO2: 22 mmol/L (ref 22–32)
Calcium: 8.7 mg/dL — ABNORMAL LOW (ref 8.9–10.3)
Chloride: 106 mmol/L (ref 98–111)
Creatinine, Ser: 0.77 mg/dL (ref 0.61–1.24)
GFR, Estimated: >60 mL/min (ref >60)
Glucose, Bld: 95 mg/dL (ref 70–99)
Potassium: 3.7 mmol/L (ref 3.5–5.1)
Sodium: 136 mmol/L (ref 135–145)
Total Bilirubin: 0.7 mg/dL (ref 0.3–1.2)
Total Protein: 6.2 g/dL — ABNORMAL LOW (ref 6.5–8.1)

## 2022-10-07 LAB — MAGNESIUM: Magnesium: 1.9 mg/dL (ref 1.7–2.4)

## 2022-10-07 MED ORDER — IPRATROPIUM-ALBUTEROL 0.5-2.5 (3) MG/3ML IN SOLN
3.0000 mL | Freq: Two times a day (BID) | RESPIRATORY_TRACT | Status: DC
  Administered 2022-10-07 – 2022-10-14 (×14): 3 mL via RESPIRATORY_TRACT
  Filled 2022-10-07 (×14): qty 3

## 2022-10-07 NOTE — Plan of Care (Signed)

## 2022-10-07 NOTE — Plan of Care (Signed)

## 2022-10-07 NOTE — TOC Progression Note (Addendum)
Transition of Care [facility]) - Progression Note    Patient Details  Name: ROCK SOBOL MRN: [id number] Date of Birth: 05-19-1956  [facility]) CM/SW Contact  Joanne Chars, LCSW Phone Number: 10/07/2022, 8:36 AM  Clinical Narrative:  CSW spoke with daughter [name].  She has spoken to a contact at [facility], who said they would accept pt-asked that referral be resent, which was done.  If this is not option, next choice would be [facility].   Andrea/[place] reports she will need to check her bed situation on Monday before she will know if she can offer.     Expected Discharge Plan: [place] Barriers to Discharge: Continued Medical Work up, SNF Pending bed offer  Expected Discharge Plan and Services In-house Referral: Clinical Social Work   Post Acute Care Choice: [place] Living arrangements for the past 2 months: Single Family Home                                       Social Determinants of Health (SDOH) Interventions SDOH Screenings   Food Insecurity: No Food Insecurity (10/03/2022)  Housing: Low Risk  (10/03/2022)  Transportation Needs: No Transportation Needs (10/03/2022)  Utilities: Not At Risk (10/03/2022)  Depression (PHQ2-9): Low Risk  (01/27/2021)  Tobacco Use: Medium Risk (10/02/2022)    Readmission Risk Interventions     No data to display

## 2022-10-07 NOTE — Progress Notes (Signed)
Occupational Therapy Treatment Patient Details Name: [name] MRN: [id number] DOB: 03/04/1956 Today's Date: 10/07/2022   History of present illness 67 y.o. male presents to [facility] hospital on 10/02/2022 after fall. CT scan demonstrates new L1 compression fx. PMH: Chronic back pain, alcohol use, COPD, chronic hypoxic respiratory failure (on 2 L with CPAP and PRN), OSA on CPAP, HTN, CAD, macular degeneration, tracheostomy 07/2021.   OT comments  Pt demonstrates progress this session from initial evaluation. HR continues to fluctuate during exertion. Pt provided with continuous education on breathing technique to decrease HR  and pain level as pt frequently tenses up. Pt able to transfer from bed to chair this session while verbalizing relief from back pain versus laying in the bed.    Recommendations for follow up therapy are one component of a multi-disciplinary discharge planning process, led by the attending physician.  Recommendations may be updated based on patient status, additional functional criteria and insurance authorization.    Follow Up Recommendations  Skilled nursing-short term rehab (<3 hours/day)     Assistance Recommended at Discharge Frequent or constant Supervision/Assistance  Patient can return home with the following  Two people to help with walking and/or transfers;A lot of help with bathing/dressing/bathroom;Assistance with cooking/housework;Help with stairs or ramp for entrance;Assistance with feeding;Assist for transportation   Equipment Recommendations  Other (comment) (defer to nex venue)       Precautions / Restrictions Precautions Precautions: Back;Fall Precaution Booklet Issued: Yes (comment) Precaution Comments: back precautions Required Braces or Orthoses: Spinal Brace Spinal Brace: Thoracolumbosacral orthotic Restrictions Weight Bearing Restrictions: No       Mobility Bed Mobility Overal bed mobility: Needs Assistance Bed Mobility: Rolling,  Sidelying to Sit Rolling: Min guard       Sit to sidelying: Mod assist, +2 for physical assistance General bed mobility comments: VC provided for sequencing log roll in bed in order to maintain back precautions. Tactile cues provided along with VC to ensure carry over and better understanding. Patient Response: Anxious, Impulsive, Cooperative  Transfers Overall transfer level: Needs assistance Equipment used: Rolling walker (2 wheels) Transfers: Sit to/from Stand, Bed to chair/wheelchair/BSC Sit to Stand: Mod assist, +2 physical assistance     Step pivot transfers: Mod assist, +2 physical assistance     General transfer comment: VC for hand placement and sequencing prior to sit to stand. Pt was able to carry over instructions safely.     Balance Overall balance assessment: Needs assistance Sitting-balance support: Feet supported, Bilateral upper extremity supported Sitting balance-Leahy Scale: Fair Sitting balance - Comments: Pt relies heavily on BUE to maintain balance while seated on EOB and more so due to back pain. Unable to shift wieght equally over hips. Postural control: Left lateral lean Standing balance support: Bilateral upper extremity supported, During functional activity, Reliant on assistive device for balance Standing balance-Leahy Scale: Fair Standing balance comment: Need UE assist and Min A to remain upright.           ADL either performed or assessed with clinical judgement      Cognition Arousal/Alertness: Awake/alert Behavior During Therapy: Impulsive Overall Cognitive Status: No family/caregiver present to determine baseline cognitive functioning Area of Impairment: Safety/judgement, Awareness, Problem solving                 Orientation Level: Disoriented to, Place, Time   Memory: Decreased short-term memory, Decreased recall of precautions Following Commands: Follows one step commands with increased time Safety/Judgement: Decreased  awareness of safety, Decreased awareness of deficits  Problem Solving: Slow processing, Decreased initiation, Difficulty sequencing, Requires verbal cues, Requires tactile cues General Comments: Pt speaking randomly on topics that were not being addressed at the moment.                   Pertinent Vitals/ Pain       Pain Assessment Pain Assessment: 0-10 Pain Score: 9  Pain Location: back with movement and mobility, and while resting per pt Pain Descriptors / Indicators: Moaning, Guarding, Grimacing Pain Intervention(s): Limited activity within patient's tolerance, Monitored during session         Frequency  Min 2X/week        Progress Toward Goals  OT Goals(current goals can now be found in the care plan section)  Progress towards OT goals: Progressing toward goals     Plan Discharge plan remains appropriate;Frequency remains appropriate    Co-evaluation    PT/OT/SLP Co-Evaluation/Treatment: Yes Reason for Co-Treatment: To address functional/ADL transfers   OT goals addressed during session: Proper use of Adaptive equipment and DME;Strengthening/ROM      AM-PAC OT "6 Clicks" Daily Activity     Outcome Measure   Help from another person eating meals?: A Little Help from another person taking care of personal grooming?: A Lot Help from another person toileting, which includes using toliet, bedpan, or urinal?: Total Help from another person bathing (including washing, rinsing, drying)?: A Lot Help from another person to put on and taking off regular upper body clothing?: A Little Help from another person to put on and taking off regular lower body clothing?: Total 6 Click Score: 12    End of Session Equipment Utilized During Treatment: Gait belt;Rolling walker (2 wheels);Back brace  OT Visit Diagnosis: Unsteadiness on feet (R26.81);Muscle weakness (generalized) (M62.81)   Activity Tolerance Patient tolerated treatment well;Patient limited by pain    Patient Left in chair;with call bell/phone within reach;with chair alarm set   Nurse Communication Mobility status        Time: 9798-9211 OT Time Calculation (min): 30 min  Charges: OT General Charges $OT Visit: 1 Visit OT Treatments $Therapeutic Activity: 8-22 mins  Ailene Ravel, OTR/L,CBIS  Supplemental OT - [facility] and [facility] Secure Chat Preferred    Greydis Stlouis, Clarene Duke 10/07/2022, 4:38 PM

## 2022-10-07 NOTE — Progress Notes (Signed)
Progress Note    [name]   JKK:[id number]  DOB: 19-Jan-1956  DOA: 10/02/2022     4 PCP: Macarthur Critchley, MD  Initial CC: fall at home  Hospital Course: [name] is a 67 yo male with PMH chronic alcohol use, chronic back pain, COPD, chronic hypoxic respiratory failure (on 2 L with CPAP and PRN), OSA on CPAP, HTN, CAD, macular degeneration.  He presented after tripping over oxygen tubing at home and was brought in due to worsening back pain. He underwent imaging of T and L-spine which showed acute L1 compression fracture.  He also has known chronic thoracic compression fractures, chronic pubic rami fractures, right iliac bone fractures, and multiple old right rib fractures.  Case was discussed with neurosurgery and he was recommended for TLSO bracing for comfort when out of bed and outpatient follow-up. He began experiencing alcohol withdrawal soon after admission and was placed on CIWA protocol.  Interval History:  No events overnight.  Remains awake and alert.  Still some confusion appreciated this morning.  CIWA scores elevated a little more compared to yesterday this morning.  He does remain cooperative and calm however.  Encouraged him to continue working on diet intake. He also needs to continue working with PT.  Tentative plan will be for discharging to [facility] if accepted  Assessment and Plan:  Acute L1 compression fracture - due to mechanical fall at home (tripped over O2 tubing); patient not felt to be intoxicated upon initial evaluation by response teams -Discussed with neurosurgery, recommended for TLSO brace when out of bed for comfort - Outpatient follow-up with neurosurgery - Continue working with PT/OT - Preference is [facility] for rehab at discharge    Alcohol use disorder - Per daughter, patient drinks possibly up to 20 beers per day.  Patient does underreport usage - CIWA starting to downtrend -Continue thiamine, folate, multivitamin  Acute urinary  retention - Suspect due to overall deconditioning and worsening back pain with L1 compression fracture.  -Continue bladder scanning - s/p straight cath this afternoon, ~900 cc voided    Thrombocytopenia -Suspect bone marrow suppression due to chronic alcohol use   Hypertension -Continue lisinopril - PRN labetalol or hydralazine ordered as well   Hyperlipidemia -Continue statin   COPD Chronic respiratory failure with hypoxia -Uses 2 to 3 L oxygen at night normally at home.   -Some concern for exacerbation on admission and steroids were initiated.  No significant wheezing appreciated today - DC steroids and monitor clinically again - Continue breathing treatments and nebulizers   Old records reviewed in assessment of this patient  Antimicrobials:   DVT prophylaxis:  enoxaparin (LOVENOX) injection 40 mg Start: 10/03/22 1000   Code Status:   Code Status: Full Code  Mobility Assessment (last 72 hours)     Mobility Assessment     Row Name 10/07/22 0725 10/06/22 2028 10/06/22 1030 10/06/22 0745 10/05/22 2120   Does patient have an order for bedrest or is patient medically unstable No - Continue assessment No - Continue assessment -- No - Continue assessment No - Continue assessment   What is the highest level of mobility based on the progressive mobility assessment? Level 3 (Stands with assist) - Balance while standing  and cannot march in place Level 3 (Stands with assist) - Balance while standing  and cannot march in place Level 3 (Stands with assist) - Balance while standing  and cannot march in place Level 2 (Chairfast) - Balance while sitting  on edge of bed and cannot stand Level 3 (Stands with assist) - Balance while standing  and cannot march in place   Is the above level different from baseline mobility prior to current illness? Yes - Recommend PT order Yes - Recommend PT order -- Yes - Recommend PT order Yes - Recommend PT order    Row Name 10/05/22 1133 10/05/22 0900  10/04/22 1959       Does patient have an order for bedrest or is patient medically unstable -- No - Continue assessment No - Continue assessment     What is the highest level of mobility based on the progressive mobility assessment? Level 3 (Stands with assist) - Balance while standing  and cannot march in place Level 3 (Stands with assist) - Balance while standing  and cannot march in place Level 1 (Bedfast) - Unable to balance while sitting on edge of bed     Is the above level different from baseline mobility prior to current illness? -- Yes - Recommend PT order No - Consider discontinuing PT/OT              Barriers to discharge:  Disposition Plan:  SNF Status is: Inpt  Objective: Blood pressure 138/77, pulse 94, temperature 97.8 F (36.6 C), temperature source Oral, resp. rate 20, height 6\' 2"  (1.88 m), weight 104.3 kg, SpO2 96 %.  Examination:  Physical Exam Constitutional:      General: He is not in acute distress.    Appearance: He is obese. He is not ill-appearing.  HENT:     Head: Normocephalic and atraumatic.     Mouth/Throat:     Mouth: Mucous membranes are moist.  Eyes:     Extraocular Movements: Extraocular movements intact.  Cardiovascular:     Rate and Rhythm: Normal rate and regular rhythm.     Heart sounds: Normal heart sounds.  Pulmonary:     Effort: Pulmonary effort is normal. No respiratory distress.     Comments: Very faint scattered expiratory wheezing noted Abdominal:     General: Bowel sounds are normal. There is no distension.     Palpations: Abdomen is soft.     Tenderness: There is no abdominal tenderness.  Musculoskeletal:        General: Normal range of motion.     Cervical back: Normal range of motion and neck supple.  Skin:    General: Skin is warm and dry.  Neurological:     General: No focal deficit present.     Mental Status: He is oriented to person, place, and time.     Comments: Tremor appreciated in bilateral upper extremities   Psychiatric:        Mood and Affect: Mood normal.        Behavior: Behavior normal.      Consultants:    Procedures:    Data Reviewed: Results for orders placed or performed during the hospital encounter of 10/02/22 (from the past 24 hour(s))  CBC with Differential/Platelet     Status: Abnormal   Collection Time: 10/07/22  4:11 AM  Result Value Ref Range   WBC 5.2 4.0 - 10.5 K/uL   RBC 3.51 (L) 4.22 - 5.81 MIL/uL   Hemoglobin 11.9 (L) 13.0 - 17.0 g/dL   HCT 10/09/22 (L) 16.1 - 09.6 %   MCV 100.0 80.0 - 100.0 fL   MCH 33.9 26.0 - 34.0 pg   MCHC 33.9 30.0 - 36.0 g/dL   RDW 04.5 40.9 - 81.1 %  Platelets 153 150 - 400 K/uL   nRBC 0.0 0.0 - 0.2 %   Neutrophils Relative % 68 %   Neutro Abs 3.5 1.7 - 7.7 K/uL   Lymphocytes Relative 17 %   Lymphs Abs 0.9 0.7 - 4.0 K/uL   Monocytes Relative 13 %   Monocytes Absolute 0.7 0.1 - 1.0 K/uL   Eosinophils Relative 1 %   Eosinophils Absolute 0.1 0.0 - 0.5 K/uL   Basophils Relative 0 %   Basophils Absolute 0.0 0.0 - 0.1 K/uL   Immature Granulocytes 1 %   Abs Immature Granulocytes 0.03 0.00 - 0.07 K/uL  Comprehensive metabolic panel     Status: Abnormal   Collection Time: 10/07/22  4:11 AM  Result Value Ref Range   Sodium 136 135 - 145 mmol/L   Potassium 3.7 3.5 - 5.1 mmol/L   Chloride 106 98 - 111 mmol/L   CO2 22 22 - 32 mmol/L   Glucose, Bld 95 70 - 99 mg/dL   BUN 14 8 - 23 mg/dL   Creatinine, Ser 0.77 0.61 - 1.24 mg/dL   Calcium 8.7 (L) 8.9 - 10.3 mg/dL   Total Protein 6.2 (L) 6.5 - 8.1 g/dL   Albumin 3.4 (L) 3.5 - 5.0 g/dL   AST 33 15 - 41 U/L   ALT 28 0 - 44 U/L   Alkaline Phosphatase 88 38 - 126 U/L   Total Bilirubin 0.7 0.3 - 1.2 mg/dL   GFR, Estimated >60 >60 mL/min   Anion gap 8 5 - 15  Magnesium     Status: None   Collection Time: 10/07/22  4:11 AM  Result Value Ref Range   Magnesium 1.9 1.7 - 2.4 mg/dL    I have reviewed pertinent nursing notes, vitals, labs, and images as necessary. I have ordered labwork to  follow up on as indicated.  I have reviewed the last notes from staff over past 24 hours. I have discussed patient's care plan and test results with nursing staff, CM/SW, and other staff as appropriate.  Time spent: Greater than 50% of the 55 minute visit was spent in counseling/coordination of care for the patient as laid out in the A&P.   LOS: 4 days   Dwyane Dee, MD [facility] 10/07/2022, 1:02 PM

## 2022-10-07 NOTE — Progress Notes (Signed)
Physical Therapy Treatment Patient Details Name: [name] MRN: [id number] DOB: Sep 06, 1956 Today's Date: 10/07/2022   History of Present Illness 67 y.o. male presents to [facility] hospital on 10/02/2022 after fall. CT scan demonstrates new L1 compression fx. PMH: Chronic back pain, alcohol use, COPD, chronic hypoxic respiratory failure (on 2 L with CPAP and PRN), OSA on CPAP, HTN, CAD, macular degeneration, tracheostomy 07/2021.    PT Comments    Pt received in supine, agreeable to therapy session and eager to work on OOB transfer training, c/o pain in bed. Pt needing dense safety cues to initiate and perform all tasks and up to +2 modA for transfers, bed mobility and short gait trial at bedside with RW. Pt HR to 116 bpm (tele leads not staying in place) post-exertion and SpO2 WFL on 5L HF [place]. SpO2 desat to 88% briefly when O2 transitioned from wall to portable tank, so pt definitely still reliant on supplemental O2 at rest. Pt remains confused but with improved standing tolerance this date compared with previous sessions and able to re-orient him to task with frequent cues. Pt continues to benefit from PT services to progress toward functional mobility goals.    Recommendations for follow up therapy are one component of a multi-disciplinary discharge planning process, led by the attending physician.  Recommendations may be updated based on patient status, additional functional criteria and insurance authorization.  Follow Up Recommendations  Skilled nursing-short term rehab (<3 hours/day) Can patient physically be transported by private vehicle: No   Assistance Recommended at Discharge Frequent or constant Supervision/Assistance  Patient can return home with the following Two people to help with walking and/or transfers;Assistance with cooking/housework;Assist for transportation;Help with stairs or ramp for entrance;Direct supervision/assist for medications management   Equipment  Recommendations  Rolling walker (2 wheels);Other (comment) (defer to post-acute)    Recommendations for Other Services       Precautions / Restrictions Precautions Precautions: Back;Fall Precaution Booklet Issued: Yes (comment) Precaution Comments: back precautions Required Braces or Orthoses: Spinal Brace Spinal Brace: Thoracolumbosacral orthotic Restrictions Weight Bearing Restrictions: No     Mobility  Bed Mobility Overal bed mobility: Needs Assistance Bed Mobility: Rolling, Sidelying to Sit Rolling: Min guard Sidelying to sit: Mod assist, +2 for physical assistance       General bed mobility comments: VC provided for sequencing log roll in bed in order to maintain back precautions. Tactile cues provided along with VC to ensure carry over and better understanding. Pt restless/impulsive needs +2 safety due to poor motor planning/anxiety and pain. Patient Response: Anxious, Impulsive  Transfers Overall transfer level: Needs assistance Equipment used: Rolling walker (2 wheels) Transfers: Sit to/from Stand, Bed to chair/wheelchair/BSC Sit to Stand: Mod assist, +2 physical assistance   Step pivot transfers: Mod assist, +2 physical assistance       General transfer comment: VC for hand placement and sequencing prior to sit to stand. Pt was able to carry over instructions safely.    Ambulation/Gait Ambulation/Gait assistance: Mod assist, +2 safety/equipment Gait Distance (Feet): 10 Feet Assistive device: Rolling walker (2 wheels) Gait Pattern/deviations: Step-to pattern, Trunk flexed, Drifts right/left, Decreased stance time - right       General Gait Details: Pt very unsteady with L lean to offload sore R side, and with poor RW management, especially with backward stepping. ~71ft forward, then backward in front of chair. Pt fatigues quickly due to pain/weakness.     Balance Overall balance assessment: Needs assistance Sitting-balance support: Feet supported,  Bilateral  upper extremity supported Sitting balance-Leahy Scale: Fair Sitting balance - Comments: Pt relies heavily on BUE to maintain balance while seated on EOB and more so due to back pain. Unable to shift weight equally over hips. Pt able to release single UE briefly when cued while dressing/moving lines but quick to replace it on bed. Poor balance when unsupported. Postural control: Left lateral lean Standing balance support: Bilateral upper extremity supported, During functional activity, Reliant on assistive device for balance Standing balance-Leahy Scale: Poor Standing balance comment: Need UE assist and Min to modA to remain upright with dynamic standing tasks                            Cognition Arousal/Alertness: Awake/alert Behavior During Therapy: Impulsive, Restless, Anxious Overall Cognitive Status: No family/caregiver present to determine baseline cognitive functioning Area of Impairment: Safety/judgement, Memory, Orientation, Problem solving                 Orientation Level: Disoriented to, Place, Time Current Attention Level: Focused Memory: Decreased recall of precautions, Decreased short-term memory Following Commands: Follows one step commands with increased time, Follows multi-step commands inconsistently Safety/Judgement: Decreased awareness of safety, Decreased awareness of deficits Awareness: Intellectual Problem Solving: Slow processing, Difficulty sequencing, Requires verbal cues General Comments: At end of session, pt asking to be brought closer to the window so he can look out for his girlfriend Rosey Bath, but once reoriented to hospital, pt agreeable to PTA calling her (phone # on his tray table) and pt spoke with her, more calm after that. Pt keeping eyes closed often and needs reminders to open them frequently and forgets to breathe in through nose to get O2, needs reminders frequently.        Exercises Other Exercises Other Exercises:  encouraged supine BLE AROM ankle pumps, knee flex/ext in bed/recliner throughout the day (hourly APs)    General Comments General comments (skin integrity, edema, etc.): Blue geo mat pressure relief air cushion obtained for pt chair as pt has difficulty safely weight shifting to reduce pressure on sit bones.      Pertinent Vitals/Pain Pain Assessment Pain Assessment: 0-10 Pain Score: 9  (initially states 10/10 but agrees 9/10 with further discussion on pain scale scoring) Pain Location: back with movement and mobility, and while resting per pt Pain Descriptors / Indicators: Moaning, Guarding, Grimacing Pain Intervention(s): Limited activity within patient's tolerance, Monitored during session, Premedicated before session, Repositioned, Patient requesting pain meds-RN notified     PT Goals (current goals can now be found in the care plan section) Acute Rehab PT Goals Patient Stated Goal: return home PT Goal Formulation: With patient Time For Goal Achievement: 10/18/22 Progress towards PT goals: Progressing toward goals    Frequency    Min 3X/week      PT Plan Current plan remains appropriate    Co-evaluation PT/OT/SLP Co-Evaluation/Treatment: Yes Reason for Co-Treatment: Complexity of the patient's impairments (multi-system involvement);For patient/therapist safety;To address functional/ADL transfers   OT goals addressed during session: Proper use of Adaptive equipment and DME;Strengthening/ROM      AM-PAC PT "6 Clicks" Mobility   Outcome Measure  Help needed turning from your back to your side while in a flat bed without using bedrails?: A Little Help needed moving from lying on your back to sitting on the side of a flat bed without using bedrails?: A Lot Help needed moving to and from a bed to a chair (including a wheelchair)?: A Lot  Help needed standing up from a chair using your arms (e.g., wheelchair or bedside chair)?: A Lot Help needed to walk in hospital room?:  Total Help needed climbing 3-5 steps with a railing? : Total 6 Click Score: 11    End of Session Equipment Utilized During Treatment: Gait belt;Back brace;Oxygen Activity Tolerance: Patient tolerated treatment well;Patient limited by pain Patient left: in chair;with chair alarm set;with call bell/phone within reach Nurse Communication: Mobility status;Patient requests pain meds;Precautions;Other (comment) (safe technique for return pivot +2 with RW vs Stedy for safety back to bed.) PT Visit Diagnosis: Unsteadiness on feet (R26.81);Difficulty in walking, not elsewhere classified (R26.2);Pain Pain - Right/Left: Right Pain - part of body:  (lower back)     Time: 6759-1638 PT Time Calculation (min) (ACUTE ONLY): 31 min  Charges:  $Therapeutic Activity: 8-22 mins                     Romar Woodrick P., PTA Acute Rehabilitation Services Secure Chat Preferred 9a-5:30pm Office: [place] 10/07/2022, 6:00 PM

## 2022-10-08 ENCOUNTER — Inpatient Hospital Stay (HOSPITAL_COMMUNITY): Payer: Medicare Other

## 2022-10-08 DIAGNOSIS — J9621 Acute and chronic respiratory failure with hypoxia: Secondary | ICD-10-CM

## 2022-10-08 DIAGNOSIS — F10931 Alcohol use, unspecified with withdrawal delirium: Secondary | ICD-10-CM | POA: Diagnosis not present

## 2022-10-08 DIAGNOSIS — S32010A Wedge compression fracture of first lumbar vertebra, initial encounter for closed fracture: Secondary | ICD-10-CM | POA: Diagnosis not present

## 2022-10-08 DIAGNOSIS — F1090 Alcohol use, unspecified, uncomplicated: Secondary | ICD-10-CM | POA: Diagnosis not present

## 2022-10-08 DIAGNOSIS — S32018A Other fracture of first lumbar vertebra, initial encounter for closed fracture: Secondary | ICD-10-CM | POA: Diagnosis not present

## 2022-10-08 LAB — CBC WITH DIFFERENTIAL/PLATELET
Abs Immature Granulocytes: 0.02 10*3/uL (ref 0.00–0.07)
Basophils Absolute: 0 10*3/uL (ref 0.0–0.1)
Basophils Relative: 1 %
Eosinophils Absolute: 0.2 10*3/uL (ref 0.0–0.5)
Eosinophils Relative: 3 %
HCT: 34.4 % — ABNORMAL LOW (ref 39.0–52.0)
Hemoglobin: 12.4 g/dL — ABNORMAL LOW (ref 13.0–17.0)
Immature Granulocytes: 0 %
Lymphocytes Relative: 18 %
Lymphs Abs: 1.1 10*3/uL (ref 0.7–4.0)
MCH: 34.8 pg — ABNORMAL HIGH (ref 26.0–34.0)
MCHC: 36 g/dL (ref 30.0–36.0)
MCV: 96.6 fL (ref 80.0–100.0)
Monocytes Absolute: 0.7 10*3/uL (ref 0.1–1.0)
Monocytes Relative: 12 %
Neutro Abs: 4.1 10*3/uL (ref 1.7–7.7)
Neutrophils Relative %: 66 %
Platelets: 161 10*3/uL (ref 150–400)
RBC: 3.56 MIL/uL — ABNORMAL LOW (ref 4.22–5.81)
RDW: 12.3 % (ref 11.5–15.5)
WBC: 6.2 10*3/uL (ref 4.0–10.5)
nRBC: 0 % (ref 0.0–0.2)

## 2022-10-08 LAB — BLOOD GAS, ARTERIAL
Acid-Base Excess: 2.4 mmol/L — ABNORMAL HIGH (ref 0.0–2.0)
Bicarbonate: 26.4 mmol/L (ref 20.0–28.0)
Drawn by: 137461
O2 Saturation: 98.5 %
Patient temperature: 36.6
pCO2 arterial: 37 mmHg (ref 32–48)
pH, Arterial: 7.46 — ABNORMAL HIGH (ref 7.35–7.45)
pO2, Arterial: 109 mmHg — ABNORMAL HIGH (ref 83–108)

## 2022-10-08 LAB — COMPREHENSIVE METABOLIC PANEL
ALT: 29 U/L (ref 0–44)
AST: 29 U/L (ref 15–41)
Albumin: 3.4 g/dL — ABNORMAL LOW (ref 3.5–5.0)
Alkaline Phosphatase: 88 U/L (ref 38–126)
Anion gap: 9 (ref 5–15)
BUN: 12 mg/dL (ref 8–23)
CO2: 22 mmol/L (ref 22–32)
Calcium: 8.9 mg/dL (ref 8.9–10.3)
Chloride: 104 mmol/L (ref 98–111)
Creatinine, Ser: 0.75 mg/dL (ref 0.61–1.24)
GFR, Estimated: >60 mL/min (ref >60)
Glucose, Bld: 102 mg/dL — ABNORMAL HIGH (ref 70–99)
Potassium: 3.6 mmol/L (ref 3.5–5.1)
Sodium: 135 mmol/L (ref 135–145)
Total Bilirubin: 0.9 mg/dL (ref 0.3–1.2)
Total Protein: 6.2 g/dL — ABNORMAL LOW (ref 6.5–8.1)

## 2022-10-08 LAB — MAGNESIUM: Magnesium: 1.9 mg/dL (ref 1.7–2.4)

## 2022-10-08 MED ORDER — CHLORHEXIDINE GLUCONATE CLOTH 2 % EX PADS
6.0000 | MEDICATED_PAD | Freq: Every day | CUTANEOUS | Status: DC
  Administered 2022-10-08 – 2022-10-12 (×6): 6 via TOPICAL

## 2022-10-08 MED ORDER — METOPROLOL SUCCINATE ER 25 MG PO TB24
25.0000 mg | ORAL_TABLET | Freq: Every day | ORAL | Status: DC
  Administered 2022-10-08 – 2022-10-14 (×7): 25 mg via ORAL
  Filled 2022-10-08 (×7): qty 1

## 2022-10-08 MED ORDER — FUROSEMIDE 10 MG/ML IJ SOLN
60.0000 mg | Freq: Once | INTRAMUSCULAR | Status: AC
  Administered 2022-10-08: 60 mg via INTRAVENOUS
  Filled 2022-10-08: qty 6

## 2022-10-08 MED ORDER — METHYLPREDNISOLONE SODIUM SUCC 40 MG IJ SOLR
40.0000 mg | Freq: Once | INTRAMUSCULAR | Status: AC
  Administered 2022-10-08: 40 mg via INTRAVENOUS
  Filled 2022-10-08: qty 1

## 2022-10-08 NOTE — Plan of Care (Signed)
Overnight, patient has been restless, confused and tachycardic, tried to get out of the bed and pulling off tele, foley and IV. Soft mittens applied. CIWA scored up to 17, ativan IV was given. CPAP not able to wear due to fitting of the mask. Increased work of breathing and oxygen requirement noted. Notified TRH on call of pt's status.  (See orders)  Problem: Education: Goal: Knowledge of General Education information will improve Description: Including pain rating scale, medication(s)/side effects and non-pharmacologic comfort measures Outcome: Progressing   Problem: Health Behavior/Discharge Planning: Goal: Ability to manage health-related needs will improve Outcome: Progressing   Problem: Clinical Measurements: Goal: Ability to maintain clinical measurements within normal limits will improve Outcome: Not Progressing   Problem: Coping: Goal: Level of anxiety will decrease Outcome: Not Progressing

## 2022-10-08 NOTE — Progress Notes (Signed)
Physical Therapy Treatment Patient Details Name: [name] MRN: [id number] DOB: [birth year] Today's Date: 10/08/2022   History of Present Illness 67 y.o. male presents to [facility] on 10/02/2022 after fall. CT scan demonstrates new L1 compression fx. PMH: Chronic back pain, alcohol use, COPD, chronic hypoxic respiratory failure (on 2 L with CPAP and PRN), OSA on CPAP, HTN, CAD, macular degeneration, tracheostomy 07/2021.    PT Comments    Environmental services alerting this PTA, pt in need of assistance, upon arrival pt moaning out louding and pt at max L side of bed in sidelying with bil feet off bed and scrunched to foot of bed with gown, mitts, nasal canula and O2 monitor sticker off and telemetry disconected, called for RN for assist and upon RN arrival pt able to return to supine with max assist +2. Pt able to follow all commands however session limited this date secondary to pain and increased confusion/agitation. Further mobility deferred for pt safety. All lines/leads replaced and pt able to reposition self in supine to comfort with mod assist.  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 Pt continues to benefit from skilled PT services to progress toward functional mobility goals.    Recommendations for follow up therapy are one component of a multi-disciplinary discharge planning process, led by the attending physician.  Recommendations may be updated based on patient status, additional functional criteria and insurance authorization.  Follow Up Recommendations  Skilled nursing-short term rehab (<3 hours/day) Can patient physically be transported by private vehicle: No   Assistance Recommended at Discharge Frequent or constant Supervision/Assistance  Patient can return  home with the following Two people to help with walking and/or transfers;Assistance with cooking/housework;Assist for transportation;Help with stairs or ramp for entrance;Direct supervision/assist for medications management   Equipment Recommendations  Rolling walker (2 wheels);Other (comment) (defer to post-acute)    Recommendations for Other Services       Precautions / Restrictions Precautions Precautions: Back;Fall Precaution Booklet Issued: Yes (comment) Precaution Comments: back precautions Required Braces or Orthoses: Spinal Brace Spinal Brace: Thoracolumbosacral orthotic Restrictions Weight Bearing Restrictions: No     Mobility  Bed Mobility Overal bed mobility: Needs Assistance Bed Mobility: Rolling Rolling: Max assist, +2 for physical assistance         General bed mobility comments: pt at MAX L side of bed in sidelying and feet hanging off bed on arrival with pt scrunched down to foot of bed, moaning loudly, max a +2 to roll and reposition 2/2 pain Patient Response: Restless  Transfers                   General transfer comment: deferred for pt safety as pt with increased aggitation    Ambulation/Gait                   Stairs             Wheelchair Mobility    Modified Rankin (Stroke Patients Only)       Balance Overall balance assessment: Needs assistance     Sitting balance - Comments: deferred this date as pt with increased aggitation  Cognition Arousal/Alertness: Awake/alert Behavior During Therapy: Agitated, Restless, Impulsive Overall Cognitive Status: No family/caregiver present to determine baseline cognitive functioning Area of Impairment: Safety/judgement, Memory, Orientation, Problem solving                 Orientation Level: Disoriented to, Time (stating it is 1979, oriented somewhat to month, stating "I said 71 because Valentines day is coming up"  unable  to recall 2024 after re-orientation) Current Attention Level: Focused Memory: Decreased recall of precautions, Decreased short-term memory Following Commands: Follows one step commands with increased time, Follows multi-step commands inconsistently Safety/Judgement: Decreased awareness of safety, Decreased awareness of deficits Awareness: Intellectual Problem Solving: Slow processing, Difficulty sequencing, Requires verbal cues General Comments: Pt oriented to self and place, oriented to situation with prompts, however pt with tangential conversation and very internally distracted, 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 turned music on at end of session with pt more relaxed        Exercises      General Comments General comments (skin integrity, edema, etc.): environmental services alerting this PTA  when at ice machine that pt in need of assistance, on arrival pt moaning out louding and pt at max L side of bed in sidelying with bil feet off bed and scrunched to foot of bed, gown off, mitts off, tele disconected, nasal canula and O2 monitor sticker off, called for RN for assist and able to return pt to supine. OOB mobility deferred as pt with increased aggitation.      Pertinent Vitals/Pain Pain Assessment Pain Assessment: Faces Faces Pain Scale: Hurts even more Pain Location: back with movement and mobility Pain Descriptors / Indicators: Moaning, Guarding, Grimacing Pain Intervention(s): Monitored during session, Repositioned, Limited activity within patient's tolerance    Home Living                          Prior Function            PT Goals (current goals can now be found in the care plan section) Acute Rehab PT Goals PT Goal Formulation: With patient Time For Goal Achievement: 10/18/22 Progress towards PT goals: Not progressing toward  goals - comment (aggitation)    Frequency    Min 3X/week      PT Plan Current plan remains appropriate    Co-evaluation              AM-PAC PT "6 Clicks" Mobility   Outcome Measure  Help needed turning from your back to your side while in a flat bed without using bedrails?: A Little Help needed moving from lying on your back to sitting on the side of a flat bed without using bedrails?: A Lot Help needed moving to and from a bed to a chair (including a wheelchair)?: A Lot Help needed standing up from a chair using your arms (e.g., wheelchair or bedside chair)?: A Lot Help needed to walk in hospital room?: Total Help needed climbing 3-5 steps with a railing? : Total 6 Click Score: 11    End of Session Equipment Utilized During Treatment: Oxygen Activity Tolerance: Treatment limited secondary to agitation Patient left: with call bell/phone within reach;in bed;with bed alarm set Nurse Communication: Mobility status PT Visit Diagnosis: Unsteadiness on feet (R26.81);Difficulty in walking, not elsewhere classified (R26.2);Pain Pain - Right/Left: Right Pain - part of body:  (lower back)  Time: 5462-7035 PT Time Calculation (min) (ACUTE ONLY): 19 min  Charges:  $Therapeutic Activity: 8-22 mins                     Ruwayda Curet R. PTA Acute Rehabilitation Services Office: [place] 10/08/2022, 12:25 PM

## 2022-10-08 NOTE — Progress Notes (Signed)
CROSS COVER NOTE  NAME: [name] MRN: [id number] DOB : 1956/02/11    Date of Service   10/08/2022   HPI/Events of Note   Bedside RN reports patient experiencing some increased work of breathing, RR 20-30s on 7 L. + Wheezing.  Patient currently receiving albuterol neb treatment.  Patient does have a history of COPD, baseline 2-3 liters O2 at night.  Orders placed for chest x-ray and ABG.  Additionally IV Solu-Medrol dose added.   Interventions/ Plan   Neb treatment Chest x-ray ABG Solu-Medrol       Raenette Rover, DNP, [place]

## 2022-10-08 NOTE — Progress Notes (Signed)
Progress Note    [name]   NAT:[id number]  DOB: 10/24/1955  DOA: 10/02/2022     5 PCP: Macarthur Critchley, MD  Initial CC: fall at home  Hospital Course: [name] is a 67 yo male with PMH chronic alcohol use, chronic back pain, COPD, chronic hypoxic respiratory failure (on 2 L with CPAP and PRN), OSA on CPAP, HTN, CAD, macular degeneration.  He presented after tripping over oxygen tubing at home and was brought in due to worsening back pain. He underwent imaging of T and L-spine which showed acute L1 compression fracture.  He also has known chronic thoracic compression fractures, chronic pubic rami fractures, right iliac bone fractures, and multiple old right rib fractures.  Case was discussed with neurosurgery and he was recommended for TLSO bracing for comfort when out of bed and outpatient follow-up. He began experiencing alcohol withdrawal soon after admission and was placed on CIWA protocol.  Interval History:  Overnight patient became restless and agitated.  He also had increased work of breathing and O2 requirements increased.  He was given nebulizers and Solu-Medrol.  ABG and CXR were obtained. This morning he is still confused but resting comfortably in bed.  Oxygen has been weaned some. CIWA scores remain elevated and being treated with Ativan. Called and updated his daughter on the phone this morning as well.  Assessment and Plan:  Acute L1 compression fracture - due to mechanical fall at home (tripped over O2 tubing); patient not felt to be intoxicated upon initial evaluation by response teams -Discussed with neurosurgery, recommended for TLSO brace when out of bed for comfort - Outpatient follow-up with neurosurgery - Continue working with PT/OT - Preference is [facility] for rehab at discharge    Alcohol use disorder - Per daughter, patient drinks possibly up to 20 beers per day.  Patient does underreport usage - continue CIWA -Continue thiamine, folate,  multivitamin  Acute urinary retention - Suspect due to overall deconditioning and worsening back pain with L1 compression fracture.  -Had ongoing retention with bladder scans - Continue indwelling Foley for now until more stable  COPD Acute on chronic respiratory failure with hypoxia -Uses 2 to 3 L oxygen at night normally at home.   - resume steroid again on 1/20 - give lasix x 1 this morning - wean O2 as able   Thrombocytopenia -Suspect bone marrow suppression due to chronic alcohol use   Hypertension -Continue lisinopril - PRN labetalol or hydralazine ordered as well   Hyperlipidemia -Continue statin      Old records reviewed in assessment of this patient  Antimicrobials:   DVT prophylaxis:  enoxaparin (LOVENOX) injection 40 mg Start: 10/03/22 1000   Code Status:   Code Status: Full Code  Mobility Assessment (last 72 hours)     Mobility Assessment     Row Name 10/08/22 1200 10/07/22 2027 10/07/22 1700 10/07/22 1637 10/07/22 0725   Does patient have an order for bedrest or is patient medically unstable -- No - Continue assessment -- -- No - Continue assessment   What is the highest level of mobility based on the progressive mobility assessment? Level 1 (Bedfast) - Unable to balance while sitting on edge of bed Level 3 (Stands with assist) - Balance while standing  and cannot march in place Level 3 (Stands with assist) - Balance while standing  and cannot march in place Level 5 (Walks with assist in room/hall) - Balance while stepping forward/back and can walk in room  with assist - Complete Level 3 (Stands with assist) - Balance while standing  and cannot march in place   Is the above level different from baseline mobility prior to current illness? -- Yes - Recommend PT order -- -- Yes - Recommend PT order    Row Name 10/06/22 2028 10/06/22 1030 10/06/22 0745 10/05/22 2120     Does patient have an order for bedrest or is patient medically unstable No - Continue  assessment -- No - Continue assessment No - Continue assessment    What is the highest level of mobility based on the progressive mobility assessment? Level 3 (Stands with assist) - Balance while standing  and cannot march in place Level 3 (Stands with assist) - Balance while standing  and cannot march in place Level 2 (Chairfast) - Balance while sitting on edge of bed and cannot stand Level 3 (Stands with assist) - Balance while standing  and cannot march in place    Is the above level different from baseline mobility prior to current illness? Yes - Recommend PT order -- Yes - Recommend PT order Yes - Recommend PT order             Barriers to discharge:  Disposition Plan:  SNF Status is: Inpt  Objective: Blood pressure (!) 145/97, pulse (!) 123, temperature (S) 97.9 F (36.6 C), temperature source Oral, resp. rate (!) 22, height 6\' 2"  (1.88 m), weight 104.3 kg, SpO2 94 %.  Examination:  Physical Exam Constitutional:      General: He is not in acute distress.    Appearance: He is obese. He is not ill-appearing.     Comments: Ongoing confusion  HENT:     Head: Normocephalic and atraumatic.     Mouth/Throat:     Mouth: Mucous membranes are moist.  Eyes:     Extraocular Movements: Extraocular movements intact.  Cardiovascular:     Rate and Rhythm: Normal rate and regular rhythm.     Heart sounds: Normal heart sounds.  Pulmonary:     Effort: Pulmonary effort is normal. No respiratory distress.     Comments: Very faint scattered expiratory wheezing noted Abdominal:     General: Bowel sounds are normal. There is no distension.     Palpations: Abdomen is soft.     Tenderness: There is no abdominal tenderness.  Musculoskeletal:        General: Normal range of motion.     Cervical back: Normal range of motion and neck supple.  Skin:    General: Skin is warm and dry.  Neurological:     General: No focal deficit present.     Mental Status: He is oriented to person, place, and  time.     Comments: Tremor appreciated in bilateral upper extremities  Psychiatric:        Mood and Affect: Mood normal.        Behavior: Behavior normal.      Consultants:    Procedures:    Data Reviewed: Results for orders placed or performed during the hospital encounter of 10/02/22 (from the past 24 hour(s))  CBC with Differential/Platelet     Status: Abnormal   Collection Time: 10/08/22  3:40 AM  Result Value Ref Range   WBC 6.2 4.0 - 10.5 K/uL   RBC 3.56 (L) 4.22 - 5.81 MIL/uL   Hemoglobin 12.4 (L) 13.0 - 17.0 g/dL   HCT 34.4 (L) 39.0 - 52.0 %   MCV 96.6 80.0 - 100.0 fL  MCH 34.8 (H) 26.0 - 34.0 pg   MCHC 36.0 30.0 - 36.0 g/dL   RDW 40.9 81.1 - 91.4 %   Platelets 161 150 - 400 K/uL   nRBC 0.0 0.0 - 0.2 %   Neutrophils Relative % 66 %   Neutro Abs 4.1 1.7 - 7.7 K/uL   Lymphocytes Relative 18 %   Lymphs Abs 1.1 0.7 - 4.0 K/uL   Monocytes Relative 12 %   Monocytes Absolute 0.7 0.1 - 1.0 K/uL   Eosinophils Relative 3 %   Eosinophils Absolute 0.2 0.0 - 0.5 K/uL   Basophils Relative 1 %   Basophils Absolute 0.0 0.0 - 0.1 K/uL   Immature Granulocytes 0 %   Abs Immature Granulocytes 0.02 0.00 - 0.07 K/uL  Comprehensive metabolic panel     Status: Abnormal   Collection Time: 10/08/22  3:40 AM  Result Value Ref Range   Sodium 135 135 - 145 mmol/L   Potassium 3.6 3.5 - 5.1 mmol/L   Chloride 104 98 - 111 mmol/L   CO2 22 22 - 32 mmol/L   Glucose, Bld 102 (H) 70 - 99 mg/dL   BUN 12 8 - 23 mg/dL   Creatinine, Ser 7.82 0.61 - 1.24 mg/dL   Calcium 8.9 8.9 - 95.6 mg/dL   Total Protein 6.2 (L) 6.5 - 8.1 g/dL   Albumin 3.4 (L) 3.5 - 5.0 g/dL   AST 29 15 - 41 U/L   ALT 29 0 - 44 U/L   Alkaline Phosphatase 88 38 - 126 U/L   Total Bilirubin 0.9 0.3 - 1.2 mg/dL   GFR, Estimated >21 >30 mL/min   Anion gap 9 5 - 15  Magnesium     Status: None   Collection Time: 10/08/22  3:40 AM  Result Value Ref Range   Magnesium 1.9 1.7 - 2.4 mg/dL  Blood gas, arterial     Status:  Abnormal   Collection Time: 10/08/22  7:35 AM  Result Value Ref Range   pH, Arterial 7.46 (H) 7.35 - 7.45   pCO2 arterial 37 32 - 48 mmHg   pO2, Arterial 109 (H) 83 - 108 mmHg   Bicarbonate 26.4 20.0 - 28.0 mmol/L   Acid-Base Excess 2.4 (H) 0.0 - 2.0 mmol/L   O2 Saturation 98.5 %   Patient temperature 36.6    Collection site LEFT RADIAL    Drawn by [id number]    Allens test (pass/fail) PASS PASS    I have reviewed pertinent nursing notes, vitals, labs, and images as necessary. I have ordered labwork to follow up on as indicated.  I have reviewed the last notes from staff over past 24 hours. I have discussed patient's care plan and test results with nursing staff, CM/SW, and other staff as appropriate.  Time spent: Greater than 50% of the 55 minute visit was spent in counseling/coordination of care for the patient as laid out in the A&P.   LOS: 5 days   Lewie Chamber, MD [facility] 10/08/2022, 2:42 PM

## 2022-10-09 ENCOUNTER — Inpatient Hospital Stay (HOSPITAL_COMMUNITY): Payer: Medicare Other

## 2022-10-09 DIAGNOSIS — S32010A Wedge compression fracture of first lumbar vertebra, initial encounter for closed fracture: Secondary | ICD-10-CM

## 2022-10-09 DIAGNOSIS — F10931 Alcohol use, unspecified with withdrawal delirium: Secondary | ICD-10-CM

## 2022-10-09 DIAGNOSIS — R0603 Acute respiratory distress: Secondary | ICD-10-CM | POA: Diagnosis not present

## 2022-10-09 DIAGNOSIS — F1090 Alcohol use, unspecified, uncomplicated: Secondary | ICD-10-CM

## 2022-10-09 DIAGNOSIS — F1010 Alcohol abuse, uncomplicated: Secondary | ICD-10-CM

## 2022-10-09 DIAGNOSIS — G9340 Encephalopathy, unspecified: Secondary | ICD-10-CM

## 2022-10-09 DIAGNOSIS — I5031 Acute diastolic (congestive) heart failure: Secondary | ICD-10-CM | POA: Diagnosis not present

## 2022-10-09 LAB — ECHOCARDIOGRAM COMPLETE
AR max vel: 3.32 cm2
AV Area VTI: 3.62 cm2
AV Area mean vel: 3.04 cm2
AV Mean grad: 4 mmHg
AV Peak grad: 7.2 mmHg
Ao pk vel: 1.34 m/s
Height: 74 in
S' Lateral: 4.1 cm
Weight: 3680 oz

## 2022-10-09 LAB — COMPREHENSIVE METABOLIC PANEL
ALT: 38 U/L (ref 0–44)
AST: 37 U/L (ref 15–41)
Albumin: 3.9 g/dL (ref 3.5–5.0)
Alkaline Phosphatase: 98 U/L (ref 38–126)
Anion gap: 10 (ref 5–15)
BUN: 19 mg/dL (ref 8–23)
CO2: 24 mmol/L (ref 22–32)
Calcium: 9.2 mg/dL (ref 8.9–10.3)
Chloride: 101 mmol/L (ref 98–111)
Creatinine, Ser: 0.82 mg/dL (ref 0.61–1.24)
GFR, Estimated: >60 mL/min (ref >60)
Glucose, Bld: 118 mg/dL — ABNORMAL HIGH (ref 70–99)
Potassium: 3.5 mmol/L (ref 3.5–5.1)
Sodium: 135 mmol/L (ref 135–145)
Total Bilirubin: 0.9 mg/dL (ref 0.3–1.2)
Total Protein: 7.4 g/dL (ref 6.5–8.1)

## 2022-10-09 LAB — CBC WITH DIFFERENTIAL/PLATELET
Abs Immature Granulocytes: 0.03 10*3/uL (ref 0.00–0.07)
Basophils Absolute: 0 10*3/uL (ref 0.0–0.1)
Basophils Relative: 0 %
Eosinophils Absolute: 0 10*3/uL (ref 0.0–0.5)
Eosinophils Relative: 0 %
HCT: 40.7 % (ref 39.0–52.0)
Hemoglobin: 14.2 g/dL (ref 13.0–17.0)
Immature Granulocytes: 0 %
Lymphocytes Relative: 5 %
Lymphs Abs: 0.7 10*3/uL (ref 0.7–4.0)
MCH: 34.9 pg — ABNORMAL HIGH (ref 26.0–34.0)
MCHC: 34.9 g/dL (ref 30.0–36.0)
MCV: 100 fL (ref 80.0–100.0)
Monocytes Absolute: 1.2 10*3/uL — ABNORMAL HIGH (ref 0.1–1.0)
Monocytes Relative: 9 %
Neutro Abs: 11.1 10*3/uL — ABNORMAL HIGH (ref 1.7–7.7)
Neutrophils Relative %: 86 %
Platelets: 199 10*3/uL (ref 150–400)
RBC: 4.07 MIL/uL — ABNORMAL LOW (ref 4.22–5.81)
RDW: 12.4 % (ref 11.5–15.5)
WBC: 13 10*3/uL — ABNORMAL HIGH (ref 4.0–10.5)
nRBC: 0 % (ref 0.0–0.2)

## 2022-10-09 LAB — BLOOD GAS, ARTERIAL
Acid-Base Excess: 1.2 mmol/L (ref 0.0–2.0)
Bicarbonate: 26.6 mmol/L (ref 20.0–28.0)
O2 Saturation: 95.2 %
Patient temperature: 36.8
pCO2 arterial: 44 mmHg (ref 32–48)
pH, Arterial: 7.39 (ref 7.35–7.45)
pO2, Arterial: 68 mmHg — ABNORMAL LOW (ref 83–108)

## 2022-10-09 LAB — MAGNESIUM: Magnesium: 2 mg/dL (ref 1.7–2.4)

## 2022-10-09 LAB — HIV ANTIBODY (ROUTINE TESTING W REFLEX): HIV Screen 4th Generation wRfx: NONREACTIVE

## 2022-10-09 LAB — AMMONIA: Ammonia: 25 umol/L (ref 9–35)

## 2022-10-09 LAB — LACTIC ACID, PLASMA
Lactic Acid, Venous: 0.8 mmol/L (ref 0.5–1.9)
Lactic Acid, Venous: 0.8 mmol/L (ref 0.5–1.9)

## 2022-10-09 LAB — BRAIN NATRIURETIC PEPTIDE: B Natriuretic Peptide: 29.7 pg/mL (ref 0.0–100.0)

## 2022-10-09 LAB — PROCALCITONIN: Procalcitonin: <0.1 ng/mL

## 2022-10-09 MED ORDER — NALOXONE HCL 0.4 MG/ML IJ SOLN
INTRAMUSCULAR | Status: AC
  Filled 2022-10-09: qty 1

## 2022-10-09 MED ORDER — ALBUTEROL SULFATE (2.5 MG/3ML) 0.083% IN NEBU
2.5000 mg | INHALATION_SOLUTION | Freq: Once | RESPIRATORY_TRACT | Status: AC | PRN
  Administered 2022-10-09: 2.5 mg via RESPIRATORY_TRACT
  Filled 2022-10-09: qty 3

## 2022-10-09 MED ORDER — SODIUM CHLORIDE 0.9 % IV SOLN
2.0000 g | INTRAVENOUS | Status: AC
  Administered 2022-10-09 – 2022-10-13 (×5): 2 g via INTRAVENOUS
  Filled 2022-10-09 (×5): qty 20

## 2022-10-09 MED ORDER — HYDROMORPHONE HCL 1 MG/ML IJ SOLN
0.5000 mg | INTRAMUSCULAR | Status: DC | PRN
  Administered 2022-10-10: 1 mg via INTRAVENOUS
  Filled 2022-10-09: qty 1

## 2022-10-09 MED ORDER — FUROSEMIDE 10 MG/ML IJ SOLN
40.0000 mg | Freq: Once | INTRAMUSCULAR | Status: AC
  Administered 2022-10-09: 40 mg via INTRAVENOUS
  Filled 2022-10-09: qty 4

## 2022-10-09 MED ORDER — NALOXONE HCL 0.4 MG/ML IJ SOLN
0.1000 mg | INTRAMUSCULAR | Status: DC | PRN
  Administered 2022-10-09: 0.4 mg via INTRAVENOUS

## 2022-10-09 MED ORDER — PERFLUTREN LIPID MICROSPHERE
1.0000 mL | INTRAVENOUS | Status: AC | PRN
  Administered 2022-10-09: 4 mL via INTRAVENOUS

## 2022-10-09 MED ORDER — SODIUM CHLORIDE 0.9 % IV SOLN
500.0000 mg | INTRAVENOUS | Status: DC
  Administered 2022-10-09 – 2022-10-10 (×2): 500 mg via INTRAVENOUS
  Filled 2022-10-09 (×4): qty 5

## 2022-10-09 NOTE — Progress Notes (Signed)
Pt unable to perform CPT due to AMS

## 2022-10-09 NOTE — Consult Note (Signed)
NAME:  [name], MRN:  [id number], DOB:  February 19, 1956, LOS: 6 ADMISSION DATE:  10/02/2022, CONSULTATION DATE:  10/09/22 REFERRING MD:  Alcario Drought - [facility], CHIEF COMPLAINT:  AMS   History of Present Illness:  67 yo M PMH COPD chronic hypoxic resp failure on 2L, OSA on CPAP, HTN, Alcohol abuse, alpha-gal allergy admitted 1/14 after mechanical fall after tripping on his O2 tubing, sustaining L1 compression fx.  This admission he has also received tx for etoh withdrawal. On 1/21 PCCM was consulted with concern for possible respiratory distress.  Pertinent  Medical History  Chronic pain COPD Chronic hypoxia OSA HTN CAD Macular degeneration EtOH abuse  Significant Hospital Events: Including procedures, antibiotic start and stop dates in addition to other pertinent events   1/14 admit after mechanical fall, acute L1 fx, rec TLSO brace 1/15-1/20 etoh withdrawal 1/21 ccm consult to eval airway protection   Interim History / Subjective:  Rapid response for resp distress   O2 has been weaned to 6L from 10HFNC with spO2 still 100%  Objective   Blood pressure (!) 146/95, pulse (!) 118, temperature 98.2 F (36.8 C), temperature source Axillary, resp. rate (!) 27, height 6\' 2"  (1.88 m), weight 104.3 kg, SpO2 100 %.        Intake/Output Summary (Last 24 hours) at 10/09/2022 0708 Last data filed at 10/08/2022 2229 Gross per 24 hour  Intake 90 ml  Output 500 ml  Net -410 ml   Filed Weights   10/02/22 1800  Weight: 104.3 kg    Examination: General: Obese chronically ill older adult M NAD  HENT: NCAT. Pink tacky mm  Lungs: Near-snoring respirations, some intermittent gurgling, deep inspirations    Cardiovascular: tachycardic, regular  Abdomen: obese ndnt  Extremities: no acute joint deformity  Neuro: Asleep. Awakens to voice and follows commands, oriented. Drowsy   GU: defer  Resolved Hospital Problem list     Assessment & Plan:   Acute encephalopathy EtOH abuse with  withdrawal Acute on chronic hypoxic respiratory failure COPD OSA  Concern for HCAP L1 fx s/p mechanical fall  -think his AMS is withdrawal + medication (BZD and opiates) +/- fatigue (has OSA on CPAP, has not been wearing here, so probably some fatigue related to this) -Cxr looks like vasc congestion. He is not hypercarbic.   -don't think he is having respiratory distress. He wakes up easily, talks, doesn't feel like he is having any difficulty breathing, and abd use goes away when awake. He has a good cough. When he rests/sleeps, the concern is re his abdominal engagement + near-snoring and some gurgling.  His total picture gives me the impression that he may actually look like this at times at baseline when he sleeps without his CPAP P -fine to move to progressive  -I think its worth giving low dose PRN narcan. Ordered for 0.1-0.4 PRN -will change dilaudid to a  range of 0.5-1mg   -PRN BiPAP. Would benefit while asleep  -NPO, sips with meds for now while we see if he might require BiPAP during the day today.  [facility] and PRN suction -CPT, Is, flutter, pulm hygiene   -lasix, abx, PCT ordered by primary -mucinex  -wean O2  -TLSO, PRN analgesia, PT/OT  -Micronutrient support, CIWA -- could consider librium. Defer to primary      Best Practice (right click and "Reselect all SmartList Selections" daily)   Diet/type: NPO DVT prophylaxis: LMWH GI prophylaxis: PPI Lines: N/A Code Status:  full code Last date of  multidisciplinary goals of care discussion [per primary]  Labs   CBC: Recent Labs  Lab 10/05/22 0411 10/06/22 0557 10/07/22 0411 10/08/22 0340 10/09/22 0356  WBC 8.4 7.4 5.2 6.2 13.0*  NEUTROABS 7.6 5.7 3.5 4.1 11.1*  HGB 13.1 12.1* 11.9* 12.4* 14.2  HCT 38.4* 33.9* 35.1* 34.4* 40.7  MCV 99.5 98.3 100.0 96.6 100.0  PLT 153 152 153 161 696    Basic Metabolic Panel: Recent Labs  Lab 10/05/22 0411 10/06/22 0557 10/07/22 0411 10/08/22 0340 10/09/22 0356  NA 134*  137 136 135 135  K 3.7 3.8 3.7 3.6 3.5  CL 101 106 106 104 101  CO2 23 23 22 22 24   GLUCOSE 148* 111* 95 102* 118*  BUN 17 17 14 12 19   CREATININE 0.67 0.77 0.77 0.75 0.82  CALCIUM 9.0 8.9 8.7* 8.9 9.2  MG 2.1 2.0 1.9 1.9 2.0   GFR: Estimated Creatinine Clearance: 114.1 mL/min (by C-G formula based on SCr of 0.82 mg/dL). Recent Labs  Lab 10/06/22 0557 10/07/22 0411 10/08/22 0340 10/09/22 0356  WBC 7.4 5.2 6.2 13.0*    Liver Function Tests: Recent Labs  Lab 10/05/22 0411 10/06/22 0557 10/07/22 0411 10/08/22 0340 10/09/22 0356  AST 30 30 33 29 37  ALT 26 29 28 29  38  ALKPHOS 103 88 88 88 98  BILITOT 1.1 0.6 0.7 0.9 0.9  PROT 7.0 6.5 6.2* 6.2* 7.4  ALBUMIN 3.8 3.5 3.4* 3.4* 3.9   No results for input(s): "LIPASE", "AMYLASE" in the last 168 hours. No results for input(s): "AMMONIA" in the last 168 hours.  ABG    Component Value Date/Time   PHART 7.39 10/09/2022 0545   PCO2ART 44 10/09/2022 0545   PO2ART 68 (L) 10/09/2022 0545   HCO3 26.6 10/09/2022 0545   TCO2 29 08/10/2021 0418   ACIDBASEDEF 6.0 (H) 07/28/2021 1755   O2SAT 95.2 10/09/2022 0545     Coagulation Profile: No results for input(s): "INR", "PROTIME" in the last 168 hours.  Cardiac Enzymes: Recent Labs  Lab 10/02/22 1807  CKTOTAL 171    HbA1C: Hgb A1c MFr Bld  Date/Time Value Ref Range Status  07/28/2021 10:50 AM 5.4 4.8 - 5.6 % Final    Comment:    (NOTE) Pre diabetes:          5.7%-6.4%  Diabetes:              >6.4%  Glycemic control for   <7.0% adults with diabetes   08/31/2020 10:13 AM 5.4 4.6 - 6.5 % Final    Comment:    Glycemic Control Guidelines for People with Diabetes:Non Diabetic:  <6%Goal of Therapy: <7%Additional Action Suggested:  >8%     CBG: No results for input(s): "GLUCAP" in the last 168 hours.  Review of Systems:   Review of Systems  Constitutional: Negative.   HENT: Negative.    Eyes: Negative.   Respiratory:  Positive for cough and sputum production.  Negative for hemoptysis, shortness of breath and wheezing.   Cardiovascular: Negative.   Gastrointestinal: Negative.   Musculoskeletal:  Positive for back pain.  Skin: Negative.   Neurological: Negative.   Endo/Heme/Allergies: Negative.   Psychiatric/Behavioral:  Positive for substance abuse.      Past Medical History:  He,  has a past medical history of COPD (chronic obstructive pulmonary disease) ([place]), COPD (chronic obstructive pulmonary disease) ([place]), GERD (gastroesophageal reflux disease), History of hepatitis C, Hyperlipidemia, Hypertension, and OSA (obstructive sleep apnea).   Surgical History:   Past Surgical  History:  Procedure Laterality Date   CATARACT EXTRACTION, BILATERAL     EYE SURGERY Bilateral    IR THORACENTESIS ASP PLEURAL SPACE W/IMG GUIDE  09/14/2021   IR THORACENTESIS ASP PLEURAL SPACE W/IMG GUIDE  09/21/2021   ORIF PELVIC FRACTURE WITH PERCUTANEOUS SCREWS Right 07/29/2021   Procedure: SI SCREW FIXATION OF RIGHT PELVIC RING;  Surgeon: Myrene Galas, MD;  Location: [facility] OR;  Service: Orthopedics;  Laterality: Right;   ORTHOPEDIC SURGERY Bilateral    pt states fracture repairs to arms and legs.   REVERSE SHOULDER ARTHROPLASTY Left 09/23/2019   Procedure: REVERSE SHOULDER ARTHROPLASTY;  Surgeon: Jones Broom, MD;  Location: [facility] ORS;  Service: Orthopedics;  Laterality: Left;   TRACHEOSTOMY TUBE PLACEMENT N/A 08/10/2021   Procedure: TRACHEOSTOMY;  Surgeon: Violeta Gelinas, MD;  Location: [facility] OR;  Service: General;  Laterality: N/A;     Social History:   reports that he quit smoking about 12 years ago. His smoking use included cigarettes. He started smoking about 52 years ago. He has a 35.00 pack-year smoking history. He has never used smokeless tobacco. He reports current alcohol use of about 42.0 standard drinks of alcohol per week. He reports that he does not use drugs.   Family History:  His family history includes Heart disease in his mother; Hypertension in  his father.   Allergies No Known Allergies   Home Medications  Prior to Admission medications   Medication Sig Start Date End Date Taking? Authorizing Provider  albuterol (PROVENTIL) (2.5 MG/3ML) 0.083% nebulizer solution Take 3 mLs (2.5 mg total) by nebulization every 6 (six) hours as needed for wheezing or shortness of breath. 03/28/22  Yes Jetty Duhamel D, MD  aspirin EC 81 MG tablet Take 81 mg by mouth daily.   Yes [provider]  atorvastatin (LIPITOR) 40 MG tablet TAKE 1 TABLET BY MOUTH ONCE DAILY 05/06/22  Yes O'Neal, Ronnald Ramp, MD  cholecalciferol (VITAMIN D) 25 MCG tablet Place 2 tablets (2,000 Units total) into feeding tube daily. Patient taking differently: Take 2,000 Units by mouth daily. 08/31/21  Yes Meuth, Brooke A, PA-C  cyclobenzaprine (FLEXERIL) 5 MG tablet Take 5 mg by mouth 3 (three) times daily as needed for muscle spasms. 09/17/22  Yes [provider]  fluocinonide (LIDEX) 0.05 % external solution Apply 1 Application topically 2 (two) times daily as needed (skin irritation). 09/27/22  Yes [provider]  Fluticasone-Umeclidin-Vilant (TRELEGY ELLIPTA) 100-62.5-25 MCG/ACT AEPB INHALE 1 PUFF INTO THE LUNGS EVERY DAY AT THE SAME TIME EACH DAY Patient taking differently: Inhale 1 puff into the lungs daily. 06/16/22  Yes Young, Joni Fears D, MD  hydrochlorothiazide (MICROZIDE) 12.5 MG capsule Take 12.5 mg by mouth daily. 09/14/22  Yes [provider]  ibuprofen (ADVIL) 200 MG tablet Take 400-600 mg by mouth every 6 (six) hours as needed for mild pain.   Yes [provider]  ketoconazole (NIZORAL) 2 % cream Apply 1 Application topically 2 (two) times daily as needed for irritation.   Yes [provider]  ketoconazole (NIZORAL) 2 % shampoo Apply 1 Application topically 2 (two) times a week.   Yes [provider]  lisinopril (ZESTRIL) 5 MG tablet Take 5 mg by mouth daily. 07/04/22  Yes [provider]   metoprolol succinate (TOPROL-XL) 25 MG 24 hr tablet Take 25 mg by mouth daily. 09/30/22  Yes [provider]  Multiple Vitamins-Minerals (MULTI FOR HIM 50+ PO) Take 1 tablet by mouth daily.   Yes [provider]  OXYGEN  Inhale 3 L into the lungs at bedtime.   Yes [provider]  pantoprazole sodium (PROTONIX) 40 mg Take 40 mg by mouth daily.   Yes [provider]  tamsulosin (FLOMAX) 0.4 MG CAPS capsule Take 0.4 mg by mouth daily. 03/18/22  Yes [provider]  theophylline (THEO-24) 400 MG 24 hr capsule Take 200 mg by mouth 2 (two) times daily.   Yes [provider]  VENTOLIN HFA 108 (90 Base) MCG/ACT inhaler Inhale 2 puffs every 4-6 hours- rescue Patient taking differently: Inhale 2 puffs into the lungs every 4 (four) hours as needed for wheezing or shortness of breath. Inhale 2 puffs every 4-6 hours- rescue 08/23/22  Yes Young, Clinton D, MD  predniSONE (DELTASONE) 10 MG tablet Take 1 tablet (10 mg total) by mouth daily with breakfast. Patient not taking: Reported on 10/04/2022 08/12/22   Matsuoka, Ruby Cola, NP  roflumilast (DALIRESP) 500 MCG TABS tablet Take 0.5 tablets (250 mcg total) by mouth daily for 28 days, THEN 1 tablet (500 mcg total) daily. Patient not taking: Reported on 10/04/2022 07/15/22 08/12/23  Noemi Chapel, NP  theophylline (THEODUR) 200 MG 12 hr tablet 1 tab two times daily with food Patient not taking: Reported on 10/04/2022 09/13/22   Waymon Budge, MD     Critical care time: n/a      Tessie Fass MSN, AGACNP-BC [place] [facility] Amion for pager 10/09/2022, 8:09 AM

## 2022-10-09 NOTE — Significant Event (Addendum)
Rapid Response Event Note   Reason for Call :  Laboring breathing  Initial Focused Assessment:  Pt lying in bed with eyes closed. Breathing is tachypneic, labored, congested. He responds to pain. Once awake, he will follow simple commands, and move all extremities. He goes back to sleep very easily when not stimulated. Lungs with decreased air movement, rhonchi/rales in the bases. Skin hot to touch.   HR-116, BP-145/98, RR-32, SpO2-90% on 6L HFNC  Pt received 2mg  ativan at 0224 for CIWA-14 and dilaudid 1mg  at 0439 for 8/10 pain.   Interventions:  NTS-moderate amount thick tan secretions HFNC 10L  Albuterol tx PCXR-Pulmonary vascular congestion.  ABG-7.39/44/68/26.6 Pct, LA, BNP, Ammonia ABX Echo 40mg  lasix Tx to PCU Bipap Plan of Care:  Tx to 4E. Initiate bipap on arrival. Allow lasix time to work. PCCM formal consult to follow-await recs. Continue to monitor pt closely. Call RRT if further assistance needed.    Event Summary:   MD Notified: Olena Heckle [facility] NP, Dr. Alcario Drought to bedside, PCCM consulted and came to bedside.  Call [phone] Arrival [id number] End Time:0750   0735>Update: Shirlee Limerick, PCCM NP to bedside. Plan: 04.mg narcan, bipap PRN, chest PT q4hwa.   Dillard Essex, RN

## 2022-10-09 NOTE — Progress Notes (Signed)
[facility] Night coverage note:  S: Called to bedside by rapid response due to pt with respiratory distress.  Reportedly pt barely responsive, tachypnic, labored breathing, and congested.  On my arrival, pt is able to wake up to voice, will follow simple commands, answer questions.  Denies difficulty with breathing, falls back asleep when left alone.  Given 2mg  ativan at 0200, 1mg  dilaudid at 0400.  Vitals:   10/09/22 0545 10/09/22 0601  BP:  (!) 146/95  Pulse:  (!) 118  Resp: (!) 29 (!) 27  Temp: 98.2 F (36.8 C)   SpO2:  100%   Constitutional: Pt wakes up to voice. Eyes: PERRL, lids and conjunctivae normal ENMT: Mucous membranes are moist. Posterior pharynx clear of any exudate or lesions.Normal dentition.  Neck: normal, supple, no masses, no thyromegaly Respiratory: Coarse breath sounds throughout, no wheezing, has decent air movement, tachypnic with belly breathing. Cardiovascular: Tachycardic, regular rhythm Abdomen: no tenderness, no masses palpated. No hepatosplenomegaly. Bowel sounds positive.  Musculoskeletal: no clubbing / cyanosis. No joint deformity upper and lower extremities. Good ROM, no contractures. Normal muscle tone.  Skin: no rashes, lesions, ulcers. No induration Neurologic: MAE, grossly non-focal Psychiatric: Wakes up to voice, cooperates, falls back asleep when not stimulated.  CXR with pulm vascular congestion.  ABG    Component Value Date/Time   PHART 7.39 10/09/2022 0545   PCO2ART 44 10/09/2022 0545   PO2ART 68 (L) 10/09/2022 0545   HCO3 26.6 10/09/2022 0545   TCO2 29 08/10/2021 0418   ACIDBASEDEF 6.0 (H) 07/28/2021 1755   O2SAT 95.2 10/09/2022 0545   On 10L HFNC    A/P: DDx includes aspiration, CHF, PNA, or some combination of the 3. Seen at bedside by PCCM Dr. Duwayne Heck as well: Recommends starting BIPAP since pt able to wake up easily to voice now, no longer obtunded as originally described. Agrees with PNA ABx given WBC doubling in past 24h,  coarse BS, etc PCCM day team will formally consult. Ordering 40mg  IV lasix x1 Check BNP Check 2d echo CAP pathway: empiric rocephin and azithro BIPAP Check ammonia Check lactic acid Check procalcitonin Transfer to PCU  CRITICAL CARE Performed by: Etta Quill.   Total critical care time: 30 minutes  Critical care time was exclusive of separately billable procedures and treating other patients.  Critical care was necessary to treat or prevent imminent or life-threatening deterioration.  Critical care was time spent personally by me on the following activities: development of treatment plan with patient and/or surrogate as well as nursing, discussions with consultants, evaluation of patient's response to treatment, examination of patient, obtaining history from patient or surrogate, ordering and performing treatments and interventions, ordering and review of laboratory studies, ordering and review of radiographic studies, pulse oximetry and re-evaluation of patient's condition.

## 2022-10-09 NOTE — Plan of Care (Signed)
  Problem: Clinical Measurements: Goal: Ability to maintain clinical measurements within normal limits will improve Outcome: Progressing Goal: Will remain free from infection Outcome: Progressing Goal: Respiratory complications will improve Outcome: Progressing Goal: Cardiovascular complication will be avoided Outcome: Progressing   

## 2022-10-09 NOTE — Progress Notes (Signed)
Progress Note    KARY COLAIZZI III   VZD:[id number]  DOB: 03/23/1956  DOA: 10/02/2022     6 PCP: Macarthur Critchley, MD  Initial CC: fall at home  Hospital Course: Mr. Barcellos is a 67 yo male with PMH chronic alcohol use, chronic back pain, COPD, chronic hypoxic respiratory failure (on 2 L with CPAP and PRN), OSA on CPAP, HTN, CAD, macular degeneration.  He presented after tripping over oxygen tubing at home and was brought in due to worsening back pain. He underwent imaging of T and L-spine which showed acute L1 compression fracture.  He also has known chronic thoracic compression fractures, chronic pubic rami fractures, right iliac bone fractures, and multiple old right rib fractures.  Case was discussed with neurosurgery and he was recommended for TLSO bracing for comfort when out of bed and outpatient follow-up. He began experiencing alcohol withdrawal soon after admission and was placed on CIWA protocol.  Interval History:  Rapid response called overnight due to labored breathing.  Once he was awakened, he seems to revert back to his normal confused self.  Concern was for his tachypnea and decreased air movement. He has continued to receive Ativan for withdrawal and Dilaudid for pain. ABG, CXR, and other labs obtained. I called and spoke with daughter to update her this morning as well.  Assessment and Plan:  Acute L1 compression fracture - due to mechanical fall at home (tripped over O2 tubing); patient not felt to be intoxicated upon initial evaluation by response teams -Discussed with neurosurgery, recommended for TLSO brace when out of bed for comfort - Outpatient follow-up with neurosurgery - Continue working with PT/OT - Preference is [facility] for rehab at discharge  - dilaudid dose lowered in setting of the rapid responses   Alcohol use disorder Alcohol withdrawal - Per daughter, patient drinks possibly up to 20 beers per day.  Patient does underreport usage - continue  CIWA protocol -Continue thiamine, folate, multivitamin  Acute urinary retention - Suspect due to overall deconditioning and worsening back pain with L1 compression fracture.  -Had ongoing retention with bladder scans - Continue indwelling Foley for now until more stable  COPD Acute on chronic respiratory failure with hypoxia -Uses 2 to 3 L oxygen at night normally at home - suspect multifactorial but some volume s/p resuscitation on admission and atelectasis from being bedbound for several days since admission - s/p steroid dose and lasix dose on 1/20 - redosed with lasix on 1/21 during rapid response - negative PCT, negative BNP, no overt wheezing or volume overload; CXR stating "vascular congestion" on 1/21 - continue O2 - follow up echo - will give intermittent lasix as needed - would benefit from ongoing PT and OOB as well as incentive spirometry   Thrombocytopenia -Suspect bone marrow suppression due to chronic alcohol use   Hypertension -Continue lisinopril - PRN labetalol or hydralazine ordered as well   Hyperlipidemia -Continue statin     Old records reviewed in assessment of this patient  Antimicrobials:   DVT prophylaxis:  enoxaparin (LOVENOX) injection 40 mg Start: 10/03/22 1000   Code Status:   Code Status: Full Code  Mobility Assessment (last 72 hours)     Mobility Assessment     Row Name 10/08/22 2000 10/08/22 1200 10/07/22 2027 10/07/22 1700 10/07/22 1637   Does patient have an order for bedrest or is patient medically unstable -- -- No - Continue assessment -- --   What is the highest level of mobility  based on the progressive mobility assessment? Level 2 (Chairfast) - Balance while sitting on edge of bed and cannot stand Level 1 (Bedfast) - Unable to balance while sitting on edge of bed Level 3 (Stands with assist) - Balance while standing  and cannot march in place Level 3 (Stands with assist) - Balance while standing  and cannot march in place Level  5 (Walks with assist in room/hall) - Balance while stepping forward/back and can walk in room with assist - Complete   Is the above level different from baseline mobility prior to current illness? Yes - Recommend PT order -- Yes - Recommend PT order -- --    [place] Name 10/07/22 0725 10/06/22 2028         Does patient have an order for bedrest or is patient medically unstable No - Continue assessment No - Continue assessment      What is the highest level of mobility based on the progressive mobility assessment? Level 3 (Stands with assist) - Balance while standing  and cannot march in place Level 3 (Stands with assist) - Balance while standing  and cannot march in place      Is the above level different from baseline mobility prior to current illness? Yes - Recommend PT order Yes - Recommend PT order               Barriers to discharge:  Disposition Plan:  SNF Status is: Inpt  Objective: Blood pressure 132/87, pulse (!) 119, temperature 98.1 F (36.7 C), temperature source Tympanic, resp. rate (!) 24, height 6\' 2"  (1.88 m), weight 104.3 kg, SpO2 99 %.  Examination:  Physical Exam Constitutional:      General: He is not in acute distress.    Appearance: He is obese. He is not ill-appearing.     Comments: Ongoing confusion; awake and answers questions very easily and follows commands easily  HENT:     Head: Normocephalic and atraumatic.     Mouth/Throat:     Mouth: Mucous membranes are moist.  Eyes:     Extraocular Movements: Extraocular movements intact.  Cardiovascular:     Rate and Rhythm: Normal rate and regular rhythm.     Heart sounds: Normal heart sounds.  Pulmonary:     Effort: Pulmonary effort is normal. No respiratory distress.     Comments: No further wheezing Abdominal:     General: Bowel sounds are normal. There is no distension.     Palpations: Abdomen is soft.     Tenderness: There is no abdominal tenderness.  Musculoskeletal:        General: Normal range of  motion.     Cervical back: Normal range of motion and neck supple.  Skin:    General: Skin is warm and dry.  Neurological:     General: No focal deficit present.     Mental Status: He is oriented to person, place, and time.     Comments: Tremor appreciated in bilateral upper extremities  Psychiatric:        Mood and Affect: Mood normal.        Behavior: Behavior normal.      Consultants:    Procedures:    Data Reviewed: Results for orders placed or performed during the hospital encounter of 10/02/22 (from the past 24 hour(s))  CBC with Differential/Platelet     Status: Abnormal   Collection Time: 10/09/22  3:56 AM  Result Value Ref Range   WBC 13.0 (H) 4.0 - 10.5  K/uL   RBC 4.07 (L) 4.22 - 5.81 MIL/uL   Hemoglobin 14.2 13.0 - 17.0 g/dL   HCT 25.3 66.4 - 40.3 %   MCV 100.0 80.0 - 100.0 fL   MCH 34.9 (H) 26.0 - 34.0 pg   MCHC 34.9 30.0 - 36.0 g/dL   RDW 47.4 25.9 - 56.3 %   Platelets 199 150 - 400 K/uL   nRBC 0.0 0.0 - 0.2 %   Neutrophils Relative % 86 %   Neutro Abs 11.1 (H) 1.7 - 7.7 K/uL   Lymphocytes Relative 5 %   Lymphs Abs 0.7 0.7 - 4.0 K/uL   Monocytes Relative 9 %   Monocytes Absolute 1.2 (H) 0.1 - 1.0 K/uL   Eosinophils Relative 0 %   Eosinophils Absolute 0.0 0.0 - 0.5 K/uL   Basophils Relative 0 %   Basophils Absolute 0.0 0.0 - 0.1 K/uL   Immature Granulocytes 0 %   Abs Immature Granulocytes 0.03 0.00 - 0.07 K/uL  Comprehensive metabolic panel     Status: Abnormal   Collection Time: 10/09/22  3:56 AM  Result Value Ref Range   Sodium 135 135 - 145 mmol/L   Potassium 3.5 3.5 - 5.1 mmol/L   Chloride 101 98 - 111 mmol/L   CO2 24 22 - 32 mmol/L   Glucose, Bld 118 (H) 70 - 99 mg/dL   BUN 19 8 - 23 mg/dL   Creatinine, Ser 8.75 0.61 - 1.24 mg/dL   Calcium 9.2 8.9 - 64.3 mg/dL   Total Protein 7.4 6.5 - 8.1 g/dL   Albumin 3.9 3.5 - 5.0 g/dL   AST 37 15 - 41 U/L   ALT 38 0 - 44 U/L   Alkaline Phosphatase 98 38 - 126 U/L   Total Bilirubin 0.9 0.3 - 1.2  mg/dL   GFR, Estimated >32 >95 mL/min   Anion gap 10 5 - 15  Magnesium     Status: None   Collection Time: 10/09/22  3:56 AM  Result Value Ref Range   Magnesium 2.0 1.7 - 2.4 mg/dL  Blood gas, arterial     Status: Abnormal   Collection Time: 10/09/22  5:45 AM  Result Value Ref Range   pH, Arterial 7.39 7.35 - 7.45   pCO2 arterial 44 32 - 48 mmHg   pO2, Arterial 68 (L) 83 - 108 mmHg   Bicarbonate 26.6 20.0 - 28.0 mmol/L   Acid-Base Excess 1.2 0.0 - 2.0 mmol/L   O2 Saturation 95.2 %   Patient temperature 36.8    Collection site RIGHT RADIAL    Allens test (pass/fail) PASS PASS  Lactic acid, plasma     Status: None   Collection Time: 10/09/22  7:03 AM  Result Value Ref Range   Lactic Acid, Venous 0.8 0.5 - 1.9 mmol/L  Procalcitonin - Baseline     Status: None   Collection Time: 10/09/22  7:03 AM  Result Value Ref Range   Procalcitonin <0.10 ng/mL  Brain natriuretic peptide     Status: None   Collection Time: 10/09/22  7:03 AM  Result Value Ref Range   B Natriuretic Peptide 29.7 0.0 - 100.0 pg/mL  Ammonia     Status: None   Collection Time: 10/09/22  7:03 AM  Result Value Ref Range   Ammonia 25 9 - 35 umol/L  HIV Antibody (routine testing w rflx)     Status: None   Collection Time: 10/09/22  7:03 AM  Result Value Ref Range   HIV  Screen 4th Generation wRfx Non Reactive Non Reactive  Lactic acid, plasma     Status: None   Collection Time: 10/09/22  9:58 AM  Result Value Ref Range   Lactic Acid, Venous 0.8 0.5 - 1.9 mmol/L    I have reviewed pertinent nursing notes, vitals, labs, and images as necessary. I have ordered labwork to follow up on as indicated.  I have reviewed the last notes from staff over past 24 hours. I have discussed patient's care plan and test results with nursing staff, CM/SW, and other staff as appropriate.  Time spent: Greater than 50% of the 55 minute visit was spent in counseling/coordination of care for the patient as laid out in the A&P.   LOS: 6  days   Dwyane Dee, MD [facility] 10/09/2022, 11:21 AM

## 2022-10-09 NOTE — Progress Notes (Signed)
Pt unable to perform/tolerate CPT AMS

## 2022-10-09 NOTE — Progress Notes (Signed)
Pt assessed for PRN Bipap order. No distress noted. Pt resting comfortably. Vitals stable. No BIPAP needed at this time.   Pt was placed on ordered CPAP for OSA.

## 2022-10-09 NOTE — Progress Notes (Signed)
  Echocardiogram 2D Echocardiogram has been performed.  [name] 10/09/2022, 1:07 PM

## 2022-10-10 DIAGNOSIS — S32018A Other fracture of first lumbar vertebra, initial encounter for closed fracture: Secondary | ICD-10-CM | POA: Diagnosis not present

## 2022-10-10 DIAGNOSIS — J9621 Acute and chronic respiratory failure with hypoxia: Secondary | ICD-10-CM | POA: Diagnosis not present

## 2022-10-10 DIAGNOSIS — F1090 Alcohol use, unspecified, uncomplicated: Secondary | ICD-10-CM | POA: Diagnosis not present

## 2022-10-10 DIAGNOSIS — R338 Other retention of urine: Secondary | ICD-10-CM

## 2022-10-10 DIAGNOSIS — S32010A Wedge compression fracture of first lumbar vertebra, initial encounter for closed fracture: Secondary | ICD-10-CM | POA: Diagnosis not present

## 2022-10-10 LAB — CBC WITH DIFFERENTIAL/PLATELET
Abs Immature Granulocytes: 0.04 10*3/uL (ref 0.00–0.07)
Basophils Absolute: 0 10*3/uL (ref 0.0–0.1)
Basophils Relative: 0 %
Eosinophils Absolute: 0.1 10*3/uL (ref 0.0–0.5)
Eosinophils Relative: 1 %
HCT: 39.7 % (ref 39.0–52.0)
Hemoglobin: 13.9 g/dL (ref 13.0–17.0)
Immature Granulocytes: 0 %
Lymphocytes Relative: 12 %
Lymphs Abs: 1 10*3/uL (ref 0.7–4.0)
MCH: 34.4 pg — ABNORMAL HIGH (ref 26.0–34.0)
MCHC: 35 g/dL (ref 30.0–36.0)
MCV: 98.3 fL (ref 80.0–100.0)
Monocytes Absolute: 1 10*3/uL (ref 0.1–1.0)
Monocytes Relative: 12 %
Neutro Abs: 6.7 10*3/uL (ref 1.7–7.7)
Neutrophils Relative %: 75 %
Platelets: 197 10*3/uL (ref 150–400)
RBC: 4.04 MIL/uL — ABNORMAL LOW (ref 4.22–5.81)
RDW: 12.3 % (ref 11.5–15.5)
WBC: 8.9 10*3/uL (ref 4.0–10.5)
nRBC: 0 % (ref 0.0–0.2)

## 2022-10-10 LAB — MAGNESIUM: Magnesium: 2.2 mg/dL (ref 1.7–2.4)

## 2022-10-10 LAB — COMPREHENSIVE METABOLIC PANEL
ALT: 39 U/L (ref 0–44)
AST: 29 U/L (ref 15–41)
Albumin: 3.6 g/dL (ref 3.5–5.0)
Alkaline Phosphatase: 96 U/L (ref 38–126)
Anion gap: 7 (ref 5–15)
BUN: 24 mg/dL — ABNORMAL HIGH (ref 8–23)
CO2: 25 mmol/L (ref 22–32)
Calcium: 8.9 mg/dL (ref 8.9–10.3)
Chloride: 104 mmol/L (ref 98–111)
Creatinine, Ser: 0.74 mg/dL (ref 0.61–1.24)
GFR, Estimated: >60 mL/min (ref >60)
Glucose, Bld: 96 mg/dL (ref 70–99)
Potassium: 3.2 mmol/L — ABNORMAL LOW (ref 3.5–5.1)
Sodium: 136 mmol/L (ref 135–145)
Total Bilirubin: 0.9 mg/dL (ref 0.3–1.2)
Total Protein: 7 g/dL (ref 6.5–8.1)

## 2022-10-10 NOTE — Progress Notes (Signed)
Patient resting comfortably on [place] this AM with no respiratory distress noted.  Bipap ordered for PRN.  Currently not indicated at this time.  Will continue to monitor and assess for bipap needs.

## 2022-10-10 NOTE — Progress Notes (Addendum)
NAME:  [name], MRN:  [id number], DOB:  1956-02-10, LOS: 7 ADMISSION DATE:  10/02/2022, CONSULTATION DATE:  10/09/22 REFERRING MD:  Alcario Drought - TR[facility], CHIEF COMPLAINT:  AMS   History of Present Illness:  67 yo M PMH COPD chronic hypoxic resp failure on 2L, OSA on CPAP, HTN, Alcohol abuse, alpha-gal allergy admitted 1/14 after mechanical fall after tripping on his O2 tubing, sustaining L1 compression fx.  This admission he has also received tx for etoh withdrawal. On 1/21 PCCM was consulted with concern for possible respiratory distress.  Pertinent  Medical History  Chronic pain COPD Chronic hypoxia OSA HTN CAD Macular degeneration EtOH abuse  Significant Hospital Events: Including procedures, antibiotic start and stop dates in addition to other pertinent events   1/14 admit after mechanical fall, acute L1 fx, rec TLSO brace 1/15-1/20 etoh withdrawal 1/21 ccm consult to eval airway protection   Interim History / Subjective:  2 L [place], no respiratory distress Complaining of back pain Objective   Blood pressure 124/84, pulse (!) 108, temperature 97.8 F (36.6 C), temperature source Axillary, resp. rate 20, height 6\' 2"  (1.88 m), weight 98.5 kg, SpO2 92 %.        Intake/Output Summary (Last 24 hours) at 10/10/2022 1310 Last data filed at 10/10/2022 1009 Gross per 24 hour  Intake 386.25 ml  Output 1950 ml  Net -1563.75 ml    Filed Weights   10/02/22 1800 10/10/22 0500  Weight: 104.3 kg 98.5 kg    Examination: General: Obese chronically ill older adult M NAD  HENT: NCAT. Pink dry  MM, No LAD, No JVD, Thick neck Lungs: Bilateral chest excursion, Clear with few rhonchi, no wheeze, diminished per bases Cardiovascular: S1, S2, RRR, achycardic, regular  Abdomen: obese ndnt , BS +. Body mass index is 27.88 kg/m.  Extremities: no acute joint deformity, no appreciable edema  Neuro: Awake and asking for help getting his over bed table across the bed  , Following commands.  Answering and asking appropriate questions  GU: defer  Resolved Hospital Problem list     Assessment & Plan:   Acute encephalopathy EtOH abuse with withdrawal Acute on chronic hypoxic respiratory failure COPD OSA  Concern for HCAP L1 fx s/p mechanical fall  -think his AMS is withdrawal + medication (BZD and opiates) +/- fatigue (has OSA on CPAP, has not been wearing here, so probably some fatigue related to this) -Cxr looks like vasc congestion. He is not hypercarbic.   -don't think he is having respiratory distress. He wakes up easily, talks, doesn't feel like he is having any difficulty breathing, and abd use goes away when awake. He has a good cough. When he rests/sleeps, the concern is re his abdominal engagement + near-snoring and some gurgling.  His total picture gives me the impression that he may actually look like this at times at baseline when he sleeps without his CPAP P -fine to move to progressive  -I think its worth giving low dose PRN narcan. Ordered for 0.1-0.4 PRN -will change dilaudid to a  range of 0.5-1mg   -PRN BiPAP. Wears CPAP at home >> Would benefit from BiPAP or CPAP  while asleep  -Diet has been advanced by Primary team  [facility] and PRN suction -CPT, Is, flutter, pulm hygiene  >> Flutter valve in room, I have added IS and instructed on use - OOB as able , PT OT   -lasix, abx, PCT ordered by primary -mucinex  -wean O2 for sats ?  90% -TLSO, PRN analgesia, PT/OT  -Micronutrient support, CIWA -- could consider librium. Defer to primary   Pt appears better today. On 2 L and taking deep breaths , in NAD, no wheezing or productive cough. Instructed on IS, pulling > 1250 cc , understands to use every hour while awake.  Diuresis of 2475 last 24 hours with significant improvement in respiratory status. PCCM will sign off as patient is much improved, on 2 L with sats of 94%. Continues to need aggressive pulmonary toilet , IS, Flutter valve and mobilization.       Best Practice (right click and "Reselect all SmartList Selections" daily)   Diet/type: Per Primary Team>> NPO if on BiPAP DVT prophylaxis: LMWH GI prophylaxis: PPI Lines: N/A Code Status:  full code Last date of multidisciplinary goals of care discussion [per primary]  Labs   CBC: Recent Labs  Lab 10/06/22 0557 10/07/22 0411 10/08/22 0340 10/09/22 0356 10/10/22 0053  WBC 7.4 5.2 6.2 13.0* 8.9  NEUTROABS 5.7 3.5 4.1 11.1* 6.7  HGB 12.1* 11.9* 12.4* 14.2 13.9  HCT 33.9* 35.1* 34.4* 40.7 39.7  MCV 98.3 100.0 96.6 100.0 98.3  PLT 152 153 161 199 197     Basic Metabolic Panel: Recent Labs  Lab 10/06/22 0557 10/07/22 0411 10/08/22 0340 10/09/22 0356 10/10/22 0053  NA 137 136 135 135 136  K 3.8 3.7 3.6 3.5 3.2*  CL 106 106 104 101 104  CO2 23 22 22 24 25   GLUCOSE 111* 95 102* 118* 96  BUN 17 14 12 19  24*  CREATININE 0.77 0.77 0.75 0.82 0.74  CALCIUM 8.9 8.7* 8.9 9.2 8.9  MG 2.0 1.9 1.9 2.0 2.2    GFR: Estimated Creatinine Clearance: 105.6 mL/min (by C-G formula based on SCr of 0.74 mg/dL). Recent Labs  Lab 10/07/22 0411 10/08/22 0340 10/09/22 0356 10/09/22 0703 10/09/22 0958 10/10/22 0053  PROCALCITON  --   --   --  <0.10  --   --   WBC 5.2 6.2 13.0*  --   --  8.9  LATICACIDVEN  --   --   --  0.8 0.8  --      Liver Function Tests: Recent Labs  Lab 10/06/22 0557 10/07/22 0411 10/08/22 0340 10/09/22 0356 10/10/22 0053  AST 30 33 29 37 29  ALT 29 28 29  38 39  ALKPHOS 88 88 88 98 96  BILITOT 0.6 0.7 0.9 0.9 0.9  PROT 6.5 6.2* 6.2* 7.4 7.0  ALBUMIN 3.5 3.4* 3.4* 3.9 3.6    No results for input(s): "LIPASE", "AMYLASE" in the last 168 hours. Recent Labs  Lab 10/09/22 0703  AMMONIA 25    ABG    Component Value Date/Time   PHART 7.39 10/09/2022 0545   PCO2ART 44 10/09/2022 0545   PO2ART 68 (L) 10/09/2022 0545   HCO3 26.6 10/09/2022 0545   TCO2 29 08/10/2021 0418   ACIDBASEDEF 6.0 (H) 07/28/2021 1755   O2SAT 95.2 10/09/2022 0545      Coagulation Profile: No results for input(s): "INR", "PROTIME" in the last 168 hours.  Cardiac Enzymes: No results for input(s): "CKTOTAL", "CKMB", "CKMBINDEX", "TROPONINI" in the last 168 hours.   HbA1C: Hgb A1c MFr Bld  Date/Time Value Ref Range Status  07/28/2021 10:50 AM 5.4 4.8 - 5.6 % Final    Comment:    (NOTE) Pre diabetes:          5.7%-6.4%  Diabetes:              >6.4%  Glycemic control for   <7.0% adults with diabetes   08/31/2020 10:13 AM 5.4 4.6 - 6.5 % Final    Comment:    Glycemic Control Guidelines for People with Diabetes:Non Diabetic:  <6%Goal of Therapy: <7%Additional Action Suggested:  >8%     CBG: No results for input(s): "GLUCAP" in the last 168 hours.     Surgical History:   Past Surgical History:  Procedure Laterality Date   CATARACT EXTRACTION, BILATERAL     EYE SURGERY Bilateral    IR THORACENTESIS ASP PLEURAL SPACE W/IMG GUIDE  09/14/2021   IR THORACENTESIS ASP PLEURAL SPACE W/IMG GUIDE  09/21/2021   ORIF PELVIC FRACTURE WITH PERCUTANEOUS SCREWS Right 07/29/2021   Procedure: SI SCREW FIXATION OF RIGHT PELVIC RING;  Surgeon: Altamese Valencia West, MD;  Location: [place];  Service: Orthopedics;  Laterality: Right;   ORTHOPEDIC SURGERY Bilateral    pt states fracture repairs to arms and legs.   REVERSE SHOULDER ARTHROPLASTY Left 09/23/2019   Procedure: REVERSE SHOULDER ARTHROPLASTY;  Surgeon: Tania Ade, MD;  Location: [facility];  Service: Orthopedics;  Laterality: Left;   TRACHEOSTOMY TUBE PLACEMENT N/A 08/10/2021   Procedure: TRACHEOSTOMY;  Surgeon: Georganna Skeans, MD;  Location: [place];  Service: General;  Laterality: N/A;     Social History:   reports that he quit smoking about 12 years ago. His smoking use included cigarettes. He started smoking about 52 years ago. He has a 35.00 pack-year smoking history. He has never used smokeless tobacco. He reports current alcohol use of about 42.0 standard drinks of alcohol per week. He reports that  he does not use drugs.   Family History:  His family history includes Heart disease in his mother; Hypertension in his father.   Allergies No Known Allergies   Home Medications  Prior to Admission medications   Medication Sig Start Date End Date Taking? Authorizing Provider  albuterol (PROVENTIL) (2.5 MG/3ML) 0.083% nebulizer solution Take 3 mLs (2.5 mg total) by nebulization every 6 (six) hours as needed for wheezing or shortness of breath. 03/28/22  Yes Baird Lyons D, MD  aspirin EC 81 MG tablet Take 81 mg by mouth daily.   Yes [provider]  atorvastatin (LIPITOR) 40 MG tablet TAKE 1 TABLET BY MOUTH ONCE DAILY 05/06/22  Yes O'Neal, Cassie Freer, MD  cholecalciferol (VITAMIN D) 25 MCG tablet Place 2 tablets (2,000 Units total) into feeding tube daily. Patient taking differently: Take 2,000 Units by mouth daily. 08/31/21  Yes Meuth, Brooke A, PA-C  cyclobenzaprine (FLEXERIL) 5 MG tablet Take 5 mg by mouth 3 (three) times daily as needed for muscle spasms. 09/17/22  Yes [provider]  fluocinonide (LIDEX) 0.05 % external solution Apply 1 Application topically 2 (two) times daily as needed (skin irritation). 09/27/22  Yes [provider]  Fluticasone-Umeclidin-Vilant (TRELEGY ELLIPTA) 100-62.5-25 MCG/ACT AEPB INHALE 1 PUFF INTO THE LUNGS EVERY DAY AT [place] Patient taking differently: Inhale 1 puff into the lungs daily. 06/16/22  Yes Young, Tarri Fuller D, MD  hydrochlorothiazide (MICROZIDE) 12.5 MG capsule Take 12.5 mg by mouth daily. 09/14/22  Yes [provider]  ibuprofen (ADVIL) 200 MG tablet Take 400-600 mg by mouth every 6 (six) hours as needed for mild pain.   Yes [provider]  ketoconazole (NIZORAL) 2 % cream Apply 1 Application topically 2 (two) times daily as needed for irritation.   Yes [provider]  ketoconazole (NIZORAL) 2 % shampoo Apply 1 Application topically 2 (two) times a  week.   Yes [provider]  lisinopril (ZESTRIL) 5 MG tablet Take 5 mg by mouth daily. 07/04/22  Yes [provider]  metoprolol succinate (TOPROL-XL) 25 MG 24 hr tablet Take 25 mg by mouth daily. 09/30/22  Yes [provider]  Multiple Vitamins-Minerals (MULTI FOR HIM 50+ PO) Take 1 tablet by mouth daily.   Yes [provider]  OXYGEN Inhale 3 L into the lungs at bedtime.   Yes [provider]  pantoprazole sodium (PROTONIX) 40 mg Take 40 mg by mouth daily.   Yes [provider]  tamsulosin (FLOMAX) 0.4 MG CAPS capsule Take 0.4 mg by mouth daily. 03/18/22  Yes [provider]  theophylline (THEO-24) 400 MG 24 hr capsule Take 200 mg by mouth 2 (two) times daily.   Yes [provider]  VENTOLIN HFA 108 (90 Base) MCG/ACT inhaler Inhale 2 puffs every 4-6 hours- rescue Patient taking differently: Inhale 2 puffs into the lungs every 4 (four) hours as needed for wheezing or shortness of breath. Inhale 2 puffs every 4-6 hours- rescue 08/23/22  Yes Young, Clinton D, MD  predniSONE (DELTASONE) 10 MG tablet Take 1 tablet (10 mg total) by mouth daily with breakfast. Patient not taking: Reported on 10/04/2022 08/12/22   Stecklein, Ruby Cola, NP  roflumilast (DALIRESP) 500 MCG TABS tablet Take 0.5 tablets (250 mcg total) by mouth daily for 28 days, THEN 1 tablet (500 mcg total) daily. Patient not taking: Reported on 10/04/2022 07/15/22 08/12/23  Noemi Chapel, NP  theophylline (THEODUR) 200 MG 12 hr tablet 1 tab two times daily with food Patient not taking: Reported on 10/04/2022 09/13/22   Waymon Budge, MD     Critical care time: n/a   APP Time >> 25 minutes   Reesa Chew, MSN, AGACNP [place] [facility] Amion for pager 10/10/2022, 1:10 PM  Critical care attending attestation note:  Patient seen and examined and relevant ancillary tests reviewed.  I agree with the assessment and plan of care as outlined by Kandice Robinsons, NP.    67 year old man COPD O2 2 L at baseline being treated for alcohol withdrawal.  Doing okay.  Near baseline oxygen.  Likely needs rehab.  Synopsis of assessment and plan:  Acute on chronic hypoxemic respiratory failure: 2 L baseline.  Worsening oxygen likely volume overload after prolonged hospital stay. -- Continue Lasix, pulmonary hygiene -- Continue bronchodilators  Toxic encephalopathy: Likely oversedated, polypharmacy -- Narcan as needed  PCCM will sign off.  CRITICAL CARE N/a  Vilma Meckel, MD  ICU Physician [facility]  See Amion for pager info  10/10/2022, 6:09 PM

## 2022-10-10 NOTE — Progress Notes (Signed)
Progress Note    [name]   JIR:[id number]  DOB: 27-Nov-1955  DOA: 10/02/2022     7 PCP: Macarthur Critchley, MD  Initial CC: fall at home  Hospital Course: [name] is a 67 yo male with PMH chronic alcohol use, chronic back pain, COPD, chronic hypoxic respiratory failure (on 2 L with CPAP and PRN), OSA on CPAP, HTN, CAD, macular degeneration.  He presented after tripping over oxygen tubing at home and was brought in due to worsening back pain. He underwent imaging of T and L-spine which showed acute L1 compression fracture.  He also has known chronic thoracic compression fractures, chronic pubic rami fractures, right iliac bone fractures, and multiple old right rib fractures.  Case was discussed with neurosurgery and he was recommended for TLSO bracing for comfort when out of bed and outpatient follow-up. He began experiencing alcohol withdrawal soon after admission and was placed on CIWA protocol.  Interval History:  No events overnight.  His breathing is comfortable and no further respiratory distress.  He is still confused but easily following commands and awake.  He was asking for something to drink this morning.  He has been n.p.o. since yesterday.  Assessment and Plan:  Acute L1 compression fracture - due to mechanical fall at home (tripped over O2 tubing); patient not felt to be intoxicated upon initial evaluation by response teams -Discussed with neurosurgery, recommended for TLSO brace when out of bed for comfort - Outpatient follow-up with neurosurgery - Continue working with PT/OT - Preference is [facility] for rehab at discharge  - dilaudid dose lowered in setting of the rapid responses   Alcohol use disorder Alcohol withdrawal - Per daughter, patient drinks possibly up to 20 beers per day.  Patient does underreport usage - continue CIWA protocol -Continue thiamine, folate, multivitamin  Acute urinary retention - Suspect due to overall deconditioning and  worsening back pain with L1 compression fracture.  -Had ongoing retention with bladder scans - Continue indwelling Foley for now until more stable  COPD Acute on chronic respiratory failure with hypoxia -Uses 2 to 3 L oxygen at night normally at home - suspect multifactorial but some volume s/p resuscitation on admission and atelectasis from being bedbound for several days since admission - s/p steroid dose and lasix dose on 1/20 - redosed with lasix on 1/21 during rapid response - negative PCT, negative BNP, no overt wheezing or volume overload; CXR stating "vascular congestion" on 1/21 - continue O2 -Echo reassuring: EF 60 to 65%, no RWMA, indeterminate diastolic parameters -Diuresed well with Lasix dose given on 10/09/2022 - would benefit from ongoing PT and OOB as well as incentive spirometry   Thrombocytopenia -Suspect bone marrow suppression due to chronic alcohol use   Hypertension -Continue lisinopril - PRN labetalol or hydralazine ordered as well   Hyperlipidemia -Continue statin     Old records reviewed in assessment of this patient  Antimicrobials:   DVT prophylaxis:  enoxaparin (LOVENOX) injection 40 mg Start: 10/03/22 1000   Code Status:   Code Status: Full Code  Mobility Assessment (last 72 hours)     Mobility Assessment     Row Name 10/10/22 1145 10/09/22 2100 10/08/22 2000 10/08/22 1200 10/07/22 2027   Does patient have an order for bedrest or is patient medically unstable No - Continue assessment No - Continue assessment -- -- No - Continue assessment   What is the highest level of mobility based on the progressive mobility assessment? Level 1 (Bedfast) -  Unable to balance while sitting on edge of bed Level 2 (Chairfast) - Balance while sitting on edge of bed and cannot stand Level 2 (Chairfast) - Balance while sitting on edge of bed and cannot stand Level 1 (Bedfast) - Unable to balance while sitting on edge of bed Level 3 (Stands with assist) - Balance  while standing  and cannot march in place   Is the above level different from baseline mobility prior to current illness? Yes - Recommend PT order Yes - Recommend PT order Yes - Recommend PT order -- Yes - Recommend PT order    Row Name 10/07/22 1700 10/07/22 1637         What is the highest level of mobility based on the progressive mobility assessment? Level 3 (Stands with assist) - Balance while standing  and cannot march in place Level 5 (Walks with assist in room/hall) - Balance while stepping forward/back and can walk in room with assist - Complete               Barriers to discharge:  Disposition Plan:  SNF Status is: Inpt  Objective: Blood pressure 124/84, pulse (!) 108, temperature 97.8 F (36.6 C), temperature source Axillary, resp. rate 20, height 6\' 2"  (1.88 m), weight 98.5 kg, SpO2 92 %.  Examination:  Physical Exam Constitutional:      General: He is not in acute distress.    Appearance: He is obese. He is not ill-appearing.     Comments: Ongoing confusion; awake and answers questions very easily and follows commands easily  HENT:     Head: Normocephalic and atraumatic.     Mouth/Throat:     Mouth: Mucous membranes are moist.  Eyes:     Extraocular Movements: Extraocular movements intact.  Cardiovascular:     Rate and Rhythm: Normal rate and regular rhythm.     Heart sounds: Normal heart sounds.  Pulmonary:     Effort: Pulmonary effort is normal. No respiratory distress.     Comments: No further wheezing Abdominal:     General: Bowel sounds are normal. There is no distension.     Palpations: Abdomen is soft.     Tenderness: There is no abdominal tenderness.  Musculoskeletal:        General: Normal range of motion.     Cervical back: Normal range of motion and neck supple.  Skin:    General: Skin is warm and dry.  Neurological:     General: No focal deficit present.     Mental Status: He is oriented to person, place, and time.     Comments: Tremor  appreciated in bilateral upper extremities  Psychiatric:        Mood and Affect: Mood normal.        Behavior: Behavior normal.      Consultants:    Procedures:    Data Reviewed: Results for orders placed or performed during the hospital encounter of 10/02/22 (from the past 24 hour(s))  CBC with Differential/Platelet     Status: Abnormal   Collection Time: 10/10/22 12:53 AM  Result Value Ref Range   WBC 8.9 4.0 - 10.5 K/uL   RBC 4.04 (L) 4.22 - 5.81 MIL/uL   Hemoglobin 13.9 13.0 - 17.0 g/dL   HCT 10/12/22 74.2 - 59.5 %   MCV 98.3 80.0 - 100.0 fL   MCH 34.4 (H) 26.0 - 34.0 pg   MCHC 35.0 30.0 - 36.0 g/dL   RDW 63.8 75.6 - 43.3 %  Platelets 197 150 - 400 K/uL   nRBC 0.0 0.0 - 0.2 %   Neutrophils Relative % 75 %   Neutro Abs 6.7 1.7 - 7.7 K/uL   Lymphocytes Relative 12 %   Lymphs Abs 1.0 0.7 - 4.0 K/uL   Monocytes Relative 12 %   Monocytes Absolute 1.0 0.1 - 1.0 K/uL   Eosinophils Relative 1 %   Eosinophils Absolute 0.1 0.0 - 0.5 K/uL   Basophils Relative 0 %   Basophils Absolute 0.0 0.0 - 0.1 K/uL   Immature Granulocytes 0 %   Abs Immature Granulocytes 0.04 0.00 - 0.07 K/uL  Comprehensive metabolic panel     Status: Abnormal   Collection Time: 10/10/22 12:53 AM  Result Value Ref Range   Sodium 136 135 - 145 mmol/L   Potassium 3.2 (L) 3.5 - 5.1 mmol/L   Chloride 104 98 - 111 mmol/L   CO2 25 22 - 32 mmol/L   Glucose, Bld 96 70 - 99 mg/dL   BUN 24 (H) 8 - 23 mg/dL   Creatinine, Ser 0.74 0.61 - 1.24 mg/dL   Calcium 8.9 8.9 - 10.3 mg/dL   Total Protein 7.0 6.5 - 8.1 g/dL   Albumin 3.6 3.5 - 5.0 g/dL   AST 29 15 - 41 U/L   ALT 39 0 - 44 U/L   Alkaline Phosphatase 96 38 - 126 U/L   Total Bilirubin 0.9 0.3 - 1.2 mg/dL   GFR, Estimated >60 >60 mL/min   Anion gap 7 5 - 15  Magnesium     Status: None   Collection Time: 10/10/22 12:53 AM  Result Value Ref Range   Magnesium 2.2 1.7 - 2.4 mg/dL    I have reviewed pertinent nursing notes, vitals, labs, and images as  necessary. I have ordered labwork to follow up on as indicated.  I have reviewed the last notes from staff over past 24 hours. I have discussed patient's care plan and test results with nursing staff, CM/SW, and other staff as appropriate.  Time spent: Greater than 50% of the 55 minute visit was spent in counseling/coordination of care for the patient as laid out in the A&P.   LOS: 7 days   Dwyane Dee, MD [facility] 10/10/2022, 1:43 PM

## 2022-10-11 DIAGNOSIS — F1090 Alcohol use, unspecified, uncomplicated: Secondary | ICD-10-CM | POA: Diagnosis not present

## 2022-10-11 DIAGNOSIS — R338 Other retention of urine: Secondary | ICD-10-CM | POA: Diagnosis not present

## 2022-10-11 DIAGNOSIS — J9621 Acute and chronic respiratory failure with hypoxia: Secondary | ICD-10-CM | POA: Diagnosis not present

## 2022-10-11 DIAGNOSIS — S32010A Wedge compression fracture of first lumbar vertebra, initial encounter for closed fracture: Secondary | ICD-10-CM | POA: Diagnosis not present

## 2022-10-11 LAB — CBC WITH DIFFERENTIAL/PLATELET
Abs Immature Granulocytes: 0.05 10*3/uL (ref 0.00–0.07)
Basophils Absolute: 0.1 10*3/uL (ref 0.0–0.1)
Basophils Relative: 1 %
Eosinophils Absolute: 0.3 10*3/uL (ref 0.0–0.5)
Eosinophils Relative: 3 %
HCT: 37.5 % — ABNORMAL LOW (ref 39.0–52.0)
Hemoglobin: 13.3 g/dL (ref 13.0–17.0)
Immature Granulocytes: 1 %
Lymphocytes Relative: 14 %
Lymphs Abs: 1.2 10*3/uL (ref 0.7–4.0)
MCH: 34.6 pg — ABNORMAL HIGH (ref 26.0–34.0)
MCHC: 35.5 g/dL (ref 30.0–36.0)
MCV: 97.7 fL (ref 80.0–100.0)
Monocytes Absolute: 0.9 10*3/uL (ref 0.1–1.0)
Monocytes Relative: 10 %
Neutro Abs: 6.4 10*3/uL (ref 1.7–7.7)
Neutrophils Relative %: 71 %
Platelets: 220 10*3/uL (ref 150–400)
RBC: 3.84 MIL/uL — ABNORMAL LOW (ref 4.22–5.81)
RDW: 12.2 % (ref 11.5–15.5)
WBC: 8.9 10*3/uL (ref 4.0–10.5)
nRBC: 0 % (ref 0.0–0.2)

## 2022-10-11 LAB — BASIC METABOLIC PANEL
Anion gap: 10 (ref 5–15)
BUN: 18 mg/dL (ref 8–23)
CO2: 21 mmol/L — ABNORMAL LOW (ref 22–32)
Calcium: 8.7 mg/dL — ABNORMAL LOW (ref 8.9–10.3)
Chloride: 102 mmol/L (ref 98–111)
Creatinine, Ser: 0.75 mg/dL (ref 0.61–1.24)
GFR, Estimated: >60 mL/min (ref >60)
Glucose, Bld: 125 mg/dL — ABNORMAL HIGH (ref 70–99)
Potassium: 3.2 mmol/L — ABNORMAL LOW (ref 3.5–5.1)
Sodium: 133 mmol/L — ABNORMAL LOW (ref 135–145)

## 2022-10-11 LAB — MAGNESIUM: Magnesium: 1.8 mg/dL (ref 1.7–2.4)

## 2022-10-11 MED ORDER — AZITHROMYCIN 500 MG PO TABS
500.0000 mg | ORAL_TABLET | Freq: Every day | ORAL | Status: AC
  Administered 2022-10-11 – 2022-10-13 (×3): 500 mg via ORAL
  Filled 2022-10-11 (×3): qty 1

## 2022-10-11 MED ORDER — OXYCODONE HCL 5 MG PO TABS
5.0000 mg | ORAL_TABLET | ORAL | Status: DC | PRN
  Administered 2022-10-11 – 2022-10-14 (×10): 5 mg via ORAL
  Filled 2022-10-11 (×11): qty 1

## 2022-10-11 MED ORDER — HYDROMORPHONE HCL 1 MG/ML IJ SOLN
0.5000 mg | INTRAMUSCULAR | Status: DC | PRN
  Administered 2022-10-11 – 2022-10-12 (×3): 0.5 mg via INTRAVENOUS
  Filled 2022-10-11 (×3): qty 0.5

## 2022-10-11 MED ORDER — IBUPROFEN 400 MG PO TABS
800.0000 mg | ORAL_TABLET | Freq: Three times a day (TID) | ORAL | Status: DC | PRN
  Administered 2022-10-11 – 2022-10-14 (×6): 800 mg via ORAL
  Filled 2022-10-11 (×6): qty 2

## 2022-10-11 MED ORDER — POTASSIUM CHLORIDE CRYS ER 20 MEQ PO TBCR
40.0000 meq | EXTENDED_RELEASE_TABLET | Freq: Once | ORAL | Status: AC
  Administered 2022-10-11: 40 meq via ORAL
  Filled 2022-10-11: qty 2

## 2022-10-11 NOTE — Progress Notes (Signed)
Occupational Therapy Treatment Patient Details Name: [name] MRN: [id number] DOB: 06/23/1956 Today's Date: 10/11/2022   History of present illness 67 y.o. male presents to [facility] hospital on 10/02/2022 after fall. CT scan demonstrates new L1 compression fx. PMH: Chronic back pain, alcohol use, COPD, chronic hypoxic respiratory failure (on 2 L with CPAP and PRN), OSA on CPAP, HTN, CAD, macular degeneration, tracheostomy 07/2021.   OT comments  Pt seen in conjunction with PT to maximize pts activity tolerance and optimize pt participation. Pt continues to present with impaired cognition, decreased activity tolerance and generalized deconditioning. Pt currently requires MIN A +2 for safety for functional ambulation with RW and MAX A for UB ADLS. Pt unable to recall any back precautions during session despite max education. Pt oriented to place and self during session. Pt would continue to benefit from skilled occupational therapy while admitted and after d/c to address the below listed limitations in order to improve overall functional mobility and facilitate independence with BADL participation. DC plan remains appropriate, will follow acutely per POC.       Recommendations for follow up therapy are one component of a multi-disciplinary discharge planning process, led by the attending physician.  Recommendations may be updated based on patient status, additional functional criteria and insurance authorization.    Follow Up Recommendations  Skilled nursing-short term rehab (<3 hours/day)     Assistance Recommended at Discharge Frequent or constant Supervision/Assistance  Patient can return home with the following  Two people to help with walking and/or transfers;A lot of help with bathing/dressing/bathroom;Assistance with cooking/housework;Help with stairs or ramp for entrance;Assistance with feeding;Assist for transportation   Equipment Recommendations  Other (comment) (defer to next venue  of care)    Recommendations for Other Services      Precautions / Restrictions Precautions Precautions: Back;Fall Precaution Booklet Issued: Yes (comment) Precaution Comments: unable to recall precautions even with max education provided Required Braces or Orthoses: Spinal Brace Spinal Brace: Thoracolumbosacral orthotic Restrictions Weight Bearing Restrictions: No       Mobility Bed Mobility Overal bed mobility: Needs Assistance Bed Mobility: Rolling, Sidelying to Sit Rolling: Min guard, +2 for safety/equipment Sidelying to sit: Min guard, +2 for safety/equipment       General bed mobility comments: pt required step by step cues to sequence log roll but able to complete supine>sit with Min guard, +2 for safety and line mgmt    Transfers Overall transfer level: Needs assistance Equipment used: Rolling walker (2 wheels) Transfers: Sit to/from Stand Sit to Stand: Min assist, +2 physical assistance, +2 safety/equipment, From elevated surface           General transfer comment: MIN A +2 to rise from elevated EOB, pt preferred to pull on RW needing cues for hand placement     Balance Overall balance assessment: Needs assistance Sitting-balance support: Feet supported, Single extremity supported Sitting balance-Leahy Scale: Fair     Standing balance support: Bilateral upper extremity supported, During functional activity, Reliant on assistive device for balance Standing balance-Leahy Scale: Poor Standing balance comment: reliant on BUE support                           ADL either performed or assessed with clinical judgement   ADL Overall ADL's : Needs assistance/impaired                 Upper Body Dressing : Maximal assistance;Cueing for sequencing;Sitting Upper Body Dressing Details (indicate cue  type and reason): to don TLSO     Toilet Transfer: Minimal assistance;+2 for physical assistance;+2 for safety/equipment;Rolling walker (2  wheels);Ambulation Toilet Transfer Details (indicate cue type and reason): simulated via functional mobility         Functional mobility during ADLs: Minimal assistance;+2 for physical assistance;+2 for safety/equipment;Rolling walker (2 wheels);Cueing for safety General ADL Comments: ADL participation impacted by impaired cog, imparied balance, decreased activity tolerance and generalized deconditioning    Extremity/Trunk Assessment Upper Extremity Assessment Upper Extremity Assessment: Overall WFL for tasks assessed   Lower Extremity Assessment Lower Extremity Assessment: Defer to PT evaluation   Cervical / Trunk Assessment Cervical / Trunk Assessment: Back Surgery    Vision Baseline Vision/History: 1 Wears glasses Patient Visual Report: No change from baseline     Perception Perception Perception: Not tested   Praxis Praxis Praxis: Not tested    Cognition Arousal/Alertness: Awake/alert Behavior During Therapy: WFL for tasks assessed/performed Overall Cognitive Status: No family/caregiver present to determine baseline cognitive functioning Area of Impairment: Orientation, Attention, Memory, Following commands, Safety/judgement, Awareness, Problem solving                 Orientation Level: Disoriented to, Time (states its 2023 and unable to recall month) Current Attention Level: Focused Memory: Decreased recall of precautions, Decreased short-term memory Following Commands: Follows one step commands consistently, Follows multi-step commands with increased time Safety/Judgement: Decreased awareness of safety, Decreased awareness of deficits Awareness: Intellectual Problem Solving: Slow processing, Decreased initiation, Difficulty sequencing, Requires verbal cues General Comments: pt unable to recall back precautions even with max cues and education, pt oriented to self and place, following commands but needs cues for higher level tasks        Exercises       Shoulder Instructions       General Comments pt on 2 L [place] with SpO2 WFL during session    Pertinent Vitals/ Pain       Pain Assessment Pain Assessment: Faces Faces Pain Scale: Hurts little more Pain Location: back with movement and mobility Pain Descriptors / Indicators: Discomfort, Grimacing Pain Intervention(s): Limited activity within patient's tolerance, Monitored during session, Patient requesting pain meds-RN notified, Repositioned  Home Living                                          Prior Functioning/Environment              Frequency  Min 2X/week        Progress Toward Goals  OT Goals(current goals can now be found in the care plan section)  Progress towards OT goals: Progressing toward goals  Acute Rehab OT Goals Patient Stated Goal: none stated Time For Goal Achievement: 10/19/22 Potential to Achieve Goals: [place] Discharge plan remains appropriate;Frequency remains appropriate    Co-evaluation    PT/OT/SLP Co-Evaluation/Treatment: Yes Reason for Co-Treatment: To address functional/ADL transfers;Necessary to address cognition/behavior during functional activity   OT goals addressed during session: ADL's and self-care      AM-PAC OT "6 Clicks" Daily Activity     Outcome Measure   Help from another person eating meals?: A Little Help from another person taking care of personal grooming?: A Little Help from another person toileting, which includes using toliet, bedpan, or urinal?: A Lot Help from another person bathing (including washing, rinsing, drying)?: A Lot Help from another person to put on  and taking off regular upper body clothing?: A Lot Help from another person to put on and taking off regular lower body clothing?: A Lot 6 Click Score: 14    End of Session Equipment Utilized During Treatment: Gait belt;Rolling walker (2 wheels);Back brace  OT Visit Diagnosis: Unsteadiness on feet (R26.81);Muscle weakness  (generalized) (M62.81)   Activity Tolerance Patient tolerated treatment well   Patient Left in bed;with call bell/phone within reach;with bed alarm set;with nursing/sitter in room;Other (comment) (seated EOB eating breakfast, telesitter in place, RN aware)   Nurse Communication Mobility status        Time: 5784-6962 OT Time Calculation (min): 20 min  Charges: OT General Charges $OT Visit: 1 Visit Lenor Derrick., COTA/L Acute Rehabilitation Services [phone]   Barron Schmid 10/11/2022, 10:23 AM

## 2022-10-11 NOTE — Progress Notes (Signed)
Patient on bedpan CPT (flutter) held at this time.

## 2022-10-11 NOTE — Progress Notes (Signed)
Cpap on standby pt not ready to go on

## 2022-10-11 NOTE — TOC Progression Note (Signed)
Transition of Care [facility]) - Progression Note    Patient Details  Name: [name] MRN: [id number] Date of Birth: 01/14/1956  Transition of Care [facility] CM/SW [place], [place] Phone Number: 10/11/2022, 3:06 PM  Clinical Narrative:     CSW called [facility] to follow up on bed offer- SNF requested to call back tomorrow, possible bed availability Thursday or Friday.  TOC will continue to follow and assist with discharge planning.  Thurmond Butts, MSW, LCSW Clinical Social Worker    Expected Discharge Plan: Skilled Nursing Facility Barriers to Discharge: Continued Medical Work up, SNF Pending bed offer  Expected Discharge Plan and Services In-house Referral: Clinical Social Work   Post Acute Care Choice: [place] Living arrangements for the past 2 months: [place] Determinants of Health (SDOH) Interventions SDOH Screenings   Food Insecurity: No Food Insecurity (10/03/2022)  Housing: Low Risk  (10/03/2022)  Transportation Needs: No Transportation Needs (10/03/2022)  Utilities: Not At Risk (10/03/2022)  Depression (PHQ2-9): Low Risk  (01/27/2021)  Tobacco Use: Medium Risk (10/02/2022)    Readmission Risk Interventions     No data to display

## 2022-10-11 NOTE — Progress Notes (Signed)
Pt not available x 2 this morning for morning neb and flutter valve/cpt.  Currently pt is walking in hall w/ therapy, no distress noted.

## 2022-10-11 NOTE — Progress Notes (Signed)
Progress Note    [name]   QQV:[id number]  DOB: 13-May-1956  DOA: 10/02/2022     8 PCP: Macarthur Critchley, MD  Initial CC: fall at home  Hospital Course: [name] is a 67 yo male with PMH chronic alcohol use, chronic back pain, COPD, chronic hypoxic respiratory failure (on 2 L with CPAP and PRN), OSA on CPAP, HTN, CAD, macular degeneration.  He presented after tripping over oxygen tubing at home and was brought in due to worsening back pain. He underwent imaging of T and L-spine which showed acute L1 compression fracture.  He also has known chronic thoracic compression fractures, chronic pubic rami fractures, right iliac bone fractures, and multiple old right rib fractures.  Case was discussed with neurosurgery and he was recommended for TLSO bracing for comfort when out of bed and outpatient follow-up. He began experiencing alcohol withdrawal soon after admission and was placed on CIWA protocol.  Interval History:  No events overnight.  Mentation much improved this morning.  He is much less confused and speaking very clearly and easily.  No impulsive movements.  He did have significant back pain when trying to work with therapy this morning.  Otherwise finally seems to be showing good signs of improvement from his withdrawal.  Assessment and Plan:  Acute L1 compression fracture - due to mechanical fall at home (tripped over O2 tubing); patient not felt to be intoxicated upon initial evaluation by response teams -Discussed with neurosurgery, recommended for TLSO brace when out of bed for comfort - Outpatient follow-up with neurosurgery - Continue working with PT/OT - Preference is [facility] for rehab at discharge  - dilaudid dose lowered in setting of the rapid responses -If mentation remains stable and no further significant withdrawal, might be ready for discharge on Wednesday or Thursday   Alcohol use disorder Alcohol withdrawal - Per daughter, patient drinks possibly up  to 20 beers per day.  Patient does underreport usage - continue CIWA protocol; scores finally downtrending; hopefully he is towards the end of his withdrawal -Continue thiamine, folate, multivitamin  Acute urinary retention - Suspect due to overall deconditioning and worsening back pain with L1 compression fracture.  -Had ongoing retention with bladder scans - Continue indwelling Foley for now until more stable -Would perform trial of void on Wednesday as his withdrawal seems to finally be improved and close to resolving  COPD Acute on chronic respiratory failure with hypoxia -Uses 2 to 3 L oxygen at night normally at home - suspect multifactorial but some volume s/p resuscitation on admission and atelectasis from being bedbound for several days since admission - s/p steroid dose and lasix dose on 1/20 - redosed with lasix on 1/21 during rapid response - negative PCT, negative BNP, no overt wheezing or volume overload; CXR stating "vascular congestion" on 1/21 - continue O2 -Echo reassuring: EF 60 to 65%, no RWMA, indeterminate diastolic parameters -Diuresed well with Lasix dose given on 10/09/2022 - would benefit from ongoing PT and OOB as well as incentive spirometry  Thrombocytopenia -Suspect bone marrow suppression due to chronic alcohol use   Hypertension -Continue lisinopril - PRN labetalol or hydralazine ordered as well   Hyperlipidemia -Continue statin     Old records reviewed in assessment of this patient  Antimicrobials:   DVT prophylaxis:  enoxaparin (LOVENOX) injection 40 mg Start: 10/03/22 1000   Code Status:   Code Status: Full Code  Mobility Assessment (last 72 hours)     Mobility Assessment  [place] Name 10/11/22 1040 10/11/22 1022 10/10/22 1145 10/09/22 2100 10/08/22 2000   Does patient have an order for bedrest or is patient medically unstable -- -- No - Continue assessment No - Continue assessment --   What is the highest level of mobility based on  the progressive mobility assessment? Level 5 (Walks with assist in room/hall) - Balance while stepping forward/back and can walk in room with assist - Complete Level 5 (Walks with assist in room/hall) - Balance while stepping forward/back and can walk in room with assist - Complete Level 1 (Bedfast) - Unable to balance while sitting on edge of bed Level 2 (Chairfast) - Balance while sitting on edge of bed and cannot stand Level 2 (Chairfast) - Balance while sitting on edge of bed and cannot stand   Is the above level different from baseline mobility prior to current illness? -- -- Yes - Recommend PT order Yes - Recommend PT order Yes - Recommend PT order    Row Name 10/08/22 1200           What is the highest level of mobility based on the progressive mobility assessment? Level 1 (Bedfast) - Unable to balance while sitting on edge of bed                Barriers to discharge:  Disposition Plan:  SNF Status is: Inpt  Objective: Blood pressure (!) 144/87, pulse (!) 109, temperature 97.7 F (36.5 C), temperature source Oral, resp. rate 19, height 6\' 2"  (1.88 m), weight 99 kg, SpO2 92 %.  Examination:  Physical Exam Constitutional:      General: He is not in acute distress.    Appearance: He is obese. He is not ill-appearing.     Comments: Awake, alert, and mentating properly/appropriately with no further overt confusion  HENT:     Head: Normocephalic and atraumatic.     Mouth/Throat:     Mouth: Mucous membranes are moist.  Eyes:     Extraocular Movements: Extraocular movements intact.  Cardiovascular:     Rate and Rhythm: Normal rate and regular rhythm.     Heart sounds: Normal heart sounds.  Pulmonary:     Effort: Pulmonary effort is normal. No respiratory distress.     Comments: No further wheezing Abdominal:     General: Bowel sounds are normal. There is no distension.     Palpations: Abdomen is soft.     Tenderness: There is no abdominal tenderness.  Musculoskeletal:         General: Normal range of motion.     Cervical back: Normal range of motion and neck supple.  Skin:    General: Skin is warm and dry.  Neurological:     General: No focal deficit present.     Mental Status: He is oriented to person, place, and time.     Comments: Very mild tremor in bilateral upper extremities which patient states is chronic  Psychiatric:        Mood and Affect: Mood normal.        Behavior: Behavior normal.      Consultants:  Pulmonology - signed off  Procedures:    Data Reviewed: Results for orders placed or performed during the hospital encounter of 10/02/22 (from the past 24 hour(s))  Basic metabolic panel     Status: Abnormal   Collection Time: 10/11/22  3:03 AM  Result Value Ref Range   Sodium 133 (L) 135 - 145 mmol/L   Potassium 3.2 (L)  3.5 - 5.1 mmol/L   Chloride 102 98 - 111 mmol/L   CO2 21 (L) 22 - 32 mmol/L   Glucose, Bld 125 (H) 70 - 99 mg/dL   BUN 18 8 - 23 mg/dL   Creatinine, Ser 9.38 0.61 - 1.24 mg/dL   Calcium 8.7 (L) 8.9 - 10.3 mg/dL   GFR, Estimated >18 >29 mL/min   Anion gap 10 5 - 15  CBC with Differential/Platelet     Status: Abnormal   Collection Time: 10/11/22  3:03 AM  Result Value Ref Range   WBC 8.9 4.0 - 10.5 K/uL   RBC 3.84 (L) 4.22 - 5.81 MIL/uL   Hemoglobin 13.3 13.0 - 17.0 g/dL   HCT 93.7 (L) 16.9 - 67.8 %   MCV 97.7 80.0 - 100.0 fL   MCH 34.6 (H) 26.0 - 34.0 pg   MCHC 35.5 30.0 - 36.0 g/dL   RDW 93.8 10.1 - 75.1 %   Platelets 220 150 - 400 K/uL   nRBC 0.0 0.0 - 0.2 %   Neutrophils Relative % 71 %   Neutro Abs 6.4 1.7 - 7.7 K/uL   Lymphocytes Relative 14 %   Lymphs Abs 1.2 0.7 - 4.0 K/uL   Monocytes Relative 10 %   Monocytes Absolute 0.9 0.1 - 1.0 K/uL   Eosinophils Relative 3 %   Eosinophils Absolute 0.3 0.0 - 0.5 K/uL   Basophils Relative 1 %   Basophils Absolute 0.1 0.0 - 0.1 K/uL   Immature Granulocytes 1 %   Abs Immature Granulocytes 0.05 0.00 - 0.07 K/uL  Magnesium     Status: None   Collection  Time: 10/11/22  3:03 AM  Result Value Ref Range   Magnesium 1.8 1.7 - 2.4 mg/dL    I have reviewed pertinent nursing notes, vitals, labs, and images as necessary. I have ordered labwork to follow up on as indicated.  I have reviewed the last notes from staff over past 24 hours. I have discussed patient's care plan and test results with nursing staff, CM/SW, and other staff as appropriate.  Time spent: Greater than 50% of the 55 minute visit was spent in counseling/coordination of care for the patient as laid out in the A&P.   LOS: 8 days   Lewie Chamber, MD [facility] 10/11/2022, 11:28 AM

## 2022-10-11 NOTE — Progress Notes (Signed)
Physical Therapy Treatment Patient Details Name: [name] MRN: [id number] DOB: 1956/02/29 Today's Date: 10/11/2022   History of Present Illness 67 y.o. male presents to [facility] hospital on 10/02/2022 after fall. CT scan demonstrates new L1 compression fx. PMH: Chronic back pain, alcohol use, COPD, chronic hypoxic respiratory failure (on 2 L with CPAP and PRN), OSA on CPAP, HTN, CAD, macular degeneration, tracheostomy 07/2021.    PT Comments    Pt seen with OTA this morning with emphasis on functional participation with OOB mobility. Pt performed bed mobility with min guard and cues for adherence to back precautions, despite poor recall. Pt required max A to don TLSO and performed transfers from EOB with RW and min A +2 for equipment/safety. Pt progressed in ambulation up to 61ft with RW and min A +2 - limited by back pain and mild dizziness. Pt continues to demonstrate cognitive impairments, poor activity tolerance, and inability to recall back precautions. Continue to recommend SNF at this time. Acute PT to cont to follow.      Recommendations for follow up therapy are one component of a multi-disciplinary discharge planning process, led by the attending physician.  Recommendations may be updated based on patient status, additional functional criteria and insurance authorization.  Follow Up Recommendations  Skilled nursing-short term rehab (<3 hours/day) Can patient physically be transported by private vehicle: Yes   Assistance Recommended at Discharge Frequent or constant Supervision/Assistance  Patient can return home with the following Assistance with cooking/housework;Assist for transportation;Help with stairs or ramp for entrance;Direct supervision/assist for medications management;A lot of help with walking and/or transfers   Equipment Recommendations  Other (comment) (TBD in next venue)    Recommendations for Other Services       Precautions / Restrictions  Precautions Precautions: Back;Fall Precaution Booklet Issued: Yes (comment) Precaution Comments: unable to recall precautions even with max education provided Required Braces or Orthoses: Spinal Brace Spinal Brace: Thoracolumbosacral orthotic Restrictions Weight Bearing Restrictions: No     Mobility  Bed Mobility Overal bed mobility: Needs Assistance Bed Mobility: Rolling, Sidelying to Sit Rolling: Min guard, +2 for safety/equipment Sidelying to sit: Min guard, +2 for safety/equipment       General bed mobility comments: pt required step by step cues to sequence log roll but able to complete supine>sit with Min guard, +2 for safety and line mgmt Patient Response: Cooperative  Transfers Overall transfer level: Needs assistance Equipment used: Rolling walker (2 wheels) Transfers: Sit to/from Stand Sit to Stand: Min assist, +2 physical assistance, +2 safety/equipment, From elevated surface           General transfer comment: MIN A +2 to rise from elevated EOB, pt preferred to pull on RW needing cues for hand placement    Ambulation/Gait Ambulation/Gait assistance: +2 safety/equipment, Min assist Gait Distance (Feet): 40 Feet Assistive device: Rolling walker (2 wheels) Gait Pattern/deviations: Step-to pattern, Trunk flexed, Decreased stance time - right, Decreased step length - right, Decreased step length - left, Decreased stride length, Narrow base of support Gait velocity: decreased Gait velocity interpretation: <1.31 ft/sec, indicative of household ambulator   General Gait Details: pt reported mild dizziness while ambulating. However, upon returning to bed stated it was more back pain than dizziness.   Stairs             Wheelchair Mobility    Modified Rankin (Stroke Patients Only)       Balance Overall balance assessment: Needs assistance Sitting-balance support: Feet supported, Single extremity supported Sitting balance-Leahy  Scale: Fair Sitting  balance - Comments: able to maintain static sitting balance with supervision   Standing balance support: Bilateral upper extremity supported (RW) Standing balance-Leahy Scale: Poor Standing balance comment: reliant on BUE support and light min A for balance                            Cognition Arousal/Alertness: Awake/alert Behavior During Therapy: WFL for tasks assessed/performed Overall Cognitive Status: No family/caregiver present to determine baseline cognitive functioning Area of Impairment: Orientation, Attention, Memory, Following commands, Safety/judgement, Awareness, Problem solving                     Memory: Decreased recall of precautions, Decreased short-term memory Following Commands: Follows one step commands consistently, Follows multi-step commands with increased time Safety/Judgement: Decreased awareness of safety, Decreased awareness of deficits   Problem Solving: Slow processing, Decreased initiation, Difficulty sequencing, Requires verbal cues General Comments: pt unable to recall back precautions even with max cues and education.        Exercises      General Comments General comments (skin integrity, edema, etc.): SPO2 WFL during session      Pertinent Vitals/Pain Pain Assessment Pain Assessment: 0-10 Pain Score: 8  Pain Location: back with movement Pain Descriptors / Indicators: Discomfort, Grimacing Pain Intervention(s): Limited activity within patient's tolerance, Monitored during session, Repositioned    Home Living                          Prior Function            PT Goals (current goals can now be found in the care plan section) Acute Rehab PT Goals Patient Stated Goal: return home PT Goal Formulation: With patient Time For Goal Achievement: 10/18/22 Potential to Achieve Goals: Good Progress towards PT goals: Progressing toward goals    Frequency    Min 3X/week      PT Plan Current plan remains  appropriate    Co-evaluation   Reason for Co-Treatment: To address functional/ADL transfers;Necessary to address cognition/behavior during functional activity   OT goals addressed during session: ADL's and self-care      AM-PAC PT "6 Clicks" Mobility   Outcome Measure  Help needed turning from your back to your side while in a flat bed without using bedrails?: A Little Help needed moving from lying on your back to sitting on the side of a flat bed without using bedrails?: A Lot Help needed moving to and from a bed to a chair (including a wheelchair)?: A Little Help needed standing up from a chair using your arms (e.g., wheelchair or bedside chair)?: A Little Help needed to walk in hospital room?: A Lot Help needed climbing 3-5 steps with a railing? : A Lot 6 Click Score: 15    End of Session Equipment Utilized During Treatment: Oxygen Activity Tolerance: Patient limited by fatigue Patient left: in bed;with bed alarm set;Other (comment) (sitting EOB eating breakfast - RN aware) Nurse Communication: Mobility status PT Visit Diagnosis: Unsteadiness on feet (R26.81);Difficulty in walking, not elsewhere classified (R26.2);Pain;Muscle weakness (generalized) (M62.81);Dizziness and giddiness (R42) Pain - Right/Left:  (back) Pain - part of body:  (back)     Time: 8366-2947 PT Time Calculation (min) (ACUTE ONLY): 20 min  Charges:  $Gait Training: 8-22 mins  Becky Sax PT, DPT  Blenda Nicely 10/11/2022, 10:43 AM

## 2022-10-11 NOTE — Progress Notes (Signed)
PHARMACIST - PHYSICIAN COMMUNICATION   CONCERNING: IV to Oral Route Change Policy  RECOMMENDATION: This patient is receiving azithromycin by the intravenous route.  Based on criteria approved by the Pharmacy and Therapeutics Committee, the intravenous medication(s) is/are being converted to the equivalent oral dose form(s).   DESCRIPTION: These criteria include: The patient is eating (either orally or via tube) and/or has been taking other orally administered medications for a least 24 hours The patient has no evidence of active gastrointestinal bleeding or impaired GI absorption (gastrectomy, short bowel, patient on TNA or NPO).  If you have questions about this conversion, please contact the Pharmacy Department  []  ( [phone] )  [place] []  ( [phone] )  [facility] [x]  ( [phone] )  [place] []  ( [phone] )  [facility] []  ( [phone] )  [facility]   Lilliana Turner, PharmD Clinical Pharmacist **Pharmacist phone directory can now be found on [email or web address] ([email or web address]).  Listed under [facility].    

## 2022-10-12 ENCOUNTER — Other Ambulatory Visit: Payer: Self-pay

## 2022-10-12 DIAGNOSIS — G4733 Obstructive sleep apnea (adult) (pediatric): Secondary | ICD-10-CM

## 2022-10-12 DIAGNOSIS — G9340 Encephalopathy, unspecified: Secondary | ICD-10-CM

## 2022-10-12 DIAGNOSIS — S32010A Wedge compression fracture of first lumbar vertebra, initial encounter for closed fracture: Secondary | ICD-10-CM | POA: Diagnosis not present

## 2022-10-12 DIAGNOSIS — R338 Other retention of urine: Secondary | ICD-10-CM | POA: Diagnosis not present

## 2022-10-12 DIAGNOSIS — F1090 Alcohol use, unspecified, uncomplicated: Secondary | ICD-10-CM | POA: Diagnosis not present

## 2022-10-12 DIAGNOSIS — S32018A Other fracture of first lumbar vertebra, initial encounter for closed fracture: Secondary | ICD-10-CM | POA: Diagnosis not present

## 2022-10-12 DIAGNOSIS — F1010 Alcohol abuse, uncomplicated: Secondary | ICD-10-CM

## 2022-10-12 LAB — BASIC METABOLIC PANEL
Anion gap: 7 (ref 5–15)
BUN: 13 mg/dL (ref 8–23)
CO2: 23 mmol/L (ref 22–32)
Calcium: 8.9 mg/dL (ref 8.9–10.3)
Chloride: 106 mmol/L (ref 98–111)
Creatinine, Ser: 0.7 mg/dL (ref 0.61–1.24)
GFR, Estimated: >60 mL/min (ref >60)
Glucose, Bld: 110 mg/dL — ABNORMAL HIGH (ref 70–99)
Potassium: 3.2 mmol/L — ABNORMAL LOW (ref 3.5–5.1)
Sodium: 136 mmol/L (ref 135–145)

## 2022-10-12 LAB — CBC WITH DIFFERENTIAL/PLATELET
Abs Immature Granulocytes: 0.06 10*3/uL (ref 0.00–0.07)
Basophils Absolute: 0 10*3/uL (ref 0.0–0.1)
Basophils Relative: 1 %
Eosinophils Absolute: 0.2 10*3/uL (ref 0.0–0.5)
Eosinophils Relative: 3 %
HCT: 37.7 % — ABNORMAL LOW (ref 39.0–52.0)
Hemoglobin: 13.4 g/dL (ref 13.0–17.0)
Immature Granulocytes: 1 %
Lymphocytes Relative: 12 %
Lymphs Abs: 0.9 10*3/uL (ref 0.7–4.0)
MCH: 34.2 pg — ABNORMAL HIGH (ref 26.0–34.0)
MCHC: 35.5 g/dL (ref 30.0–36.0)
MCV: 96.2 fL (ref 80.0–100.0)
Monocytes Absolute: 0.7 10*3/uL (ref 0.1–1.0)
Monocytes Relative: 10 %
Neutro Abs: 5.7 10*3/uL (ref 1.7–7.7)
Neutrophils Relative %: 73 %
Platelets: 256 10*3/uL (ref 150–400)
RBC: 3.92 MIL/uL — ABNORMAL LOW (ref 4.22–5.81)
RDW: 12.1 % (ref 11.5–15.5)
WBC: 7.6 10*3/uL (ref 4.0–10.5)
nRBC: 0 % (ref 0.0–0.2)

## 2022-10-12 LAB — MAGNESIUM: Magnesium: 1.8 mg/dL (ref 1.7–2.4)

## 2022-10-12 MED ORDER — MAGNESIUM SULFATE 2 GM/50ML IV SOLN
2.0000 g | Freq: Once | INTRAVENOUS | Status: AC
  Administered 2022-10-12: 2 g via INTRAVENOUS
  Filled 2022-10-12: qty 50

## 2022-10-12 MED ORDER — POTASSIUM CHLORIDE CRYS ER 20 MEQ PO TBCR
40.0000 meq | EXTENDED_RELEASE_TABLET | Freq: Two times a day (BID) | ORAL | Status: AC
  Administered 2022-10-12 (×2): 40 meq via ORAL
  Filled 2022-10-12 (×2): qty 2

## 2022-10-12 NOTE — Progress Notes (Signed)
Pt's HR sustains at 117. Metoprolol 2.5 mg iv given. Rechecked HR=104

## 2022-10-12 NOTE — Progress Notes (Signed)
TR[facility] night cross cover note:   I was notified by RN of postvoid residual bladder scan of 390 cc associate with mild abdominal distention as well as associated with patient's report of mild abdominal discomfort and feeling like he needs to pass additional urine.  I subsequently placed order for straight cath x 1 now as well as nursing communication order for q6H post-void residual bladder scans, with prn straight cath for PVR bladder scan greater than 400 cc's, and communicated this plan to the patient's RN.    Babs Bertin, DO Hospitalist

## 2022-10-12 NOTE — Progress Notes (Signed)
Patient develop mild abdominal distension,bladder scan done showed 390 cc,MD paged,straight catheter placed,got 650 cc of urine output. Pt tolerated well.Will continue to monitor

## 2022-10-12 NOTE — Progress Notes (Signed)
Mobility Specialist Progress Note:   10/12/22 1629  Mobility  Activity Transferred to/from [facility]  Level of Assistance Minimal assist, patient does 75% or more  Assistive Device Front wheel walker  Distance Ambulated (ft) 4 ft  Activity Response Tolerated well  $Mobility charge 1 Mobility   Pt received in bed willing to participate in mobility. MinA to stand from EOB then contact guard to get to [facility], pt able to have BM. Left in bed with call bell in reach and all needs met.   Gareth Eagle Cylis Ayars Mobility Specialist Please contact via [employer] or  Rehab Office at [phone]

## 2022-10-12 NOTE — Progress Notes (Signed)
PROGRESS NOTE    [name] [name]  NIO:[id number] DOB: 1956-04-30 DOA: 10/02/2022 PCP: Charles Critchley, MD   Brief Narrative:  [name] is a 67 yo male with PMH chronic alcohol use, chronic back pain, COPD, chronic hypoxic respiratory failure (on 2 L with CPAP and PRN), OSA on CPAP, HTN, CAD, macular degeneration.  He presented after tripping over oxygen tubing at home and was brought in due to worsening back pain. He underwent imaging of T and L-spine which showed acute L1 compression fracture.  He also has known chronic thoracic compression fractures, chronic pubic rami fractures, right iliac bone fractures, and multiple old right rib fractures.  Case was discussed with neurosurgery and he was recommended for TLSO bracing for comfort when out of bed and outpatient follow-up. He began experiencing alcohol withdrawal soon after admission and was placed on CIWA protocol.   **Interim History  He is improving and getting close back to his baseline. PT/OT recommending SNF and awaiting bed availability.   Assessment and Plan:  Acute L1 compression fracture -Due to mechanical fall at home (tripped over O2 tubing); patient not felt to be intoxicated upon initial evaluation by response teams -My colleague discussed with neurosurgery, recommended for TLSO brace when out of bed for comfort -Outpatient follow-up with neurosurgery -Continue working with PT/OT -Preference is [facility] for rehab at discharge and per [facility] currently not bed availability and SNF requested to call back Thursday or Friday -Dilaudid dose lowered in setting of the rapid responses -If mentation remains stable and no further significant withdrawal anticipating discharge in next 24 to 48 hours -Continue with oxycodone 5 mg every 4 as needed severe pain as well as methocarbamol 5 mg p.o. every 6 as needed for muscle spasms as well as IV hydromorphone 0.5 mg every 4 as needed for severe pain -Also continuing lidocaine 1 patch  transdermally every 12 hours -He has a bowel regimen with bisacodyl 10 mg rectal daily as needed, lactulose 20 g p.o. twice daily as needed as well as MiraLAX 17 g p.o. daily as needed for moderate constipation and Senna-Docusate 1 tab po BID   Alcohol Use Disorder Alcohol Withdrawal -Per daughter, patient drinks possibly up to 20 beers per day and patient admits to drinking for over 50 years; patient does underreport usage -Continue CIWA protocol; scores finally downtrending; hopefully he is towards the end of his withdrawal with 1 to 4 mg p.o./IV every as needed for withdrawal symptoms -Continue thiamine 100 mg p.o. daily, folic acid 1 mg p.o. daily as well as multivitamin with minerals 1 tab p.o. daily   Acute urinary retention -Suspect due to overall deconditioning and worsening back pain with L1 compression fracture.  -Had ongoing retention with bladder scans -He had Foley catheter but will do a trial of void now and do bladder scans q8h  COPD Acute on chronic respiratory failure with hypoxia -Uses 2 to 3 L oxygen at night normally at home -Suspect multifactorial but some volume s/p resuscitation on admission and atelectasis from being bedbound for several days since admission -S/p steroid dose and lasix dose on 1/20 -Redosed with lasix on 1/21 during rapid response -Continue theophylline 400 mg p.o. daily -Negative PCT, negative BNP, no overt wheezing or volume overload;  -CXR stating "vascular congestion" on 1/21 -SpO2: 92 % O2 Flow Rate (L/min): 2 L/min FiO2 (%): 40 %; Currently wears 3 Liters all the time  -Echo reassuring: EF 60 to 65%, no RWMA, indeterminate diastolic parameters -Diuresed well  with Lasix dose given on 10/09/2022 -Would benefit from ongoing PT and OOB as well as incentive spirometry -Continuous pulse oximetry maintain O2 saturation greater 90% -Continue supplemental oxygen via nasal cannula wean O2 to home regimen -Will need an amatory home O2 screen as well  as repeat chest x-ray prior to discharge   Thrombocytopenia, improved  -Suspect bone marrow suppression due to chronic alcohol use -Platelet Count Trend: Recent Labs  Lab 10/06/22 0557 10/07/22 0411 10/08/22 0340 10/09/22 0356 10/10/22 0053 10/11/22 0303 10/12/22 0322  PLT 152 153 161 199 197 220 256  -Continue to Monitor for S/Sx of Bleeding; No overt bleeding noted -Repeat CBC in the AM   Essential Hypertension -Elevated secondary to pain, continue pain control -Continue Lisinopril 10 mg po Daily and Metoprolol Succinate 25 mg po Daily -C/w Hydralazine 25 mg po q4hprn for SBP >160 or DBP >100 and Labetalol 10 mg IV q4hprn SBP >160 or DBP >100  Hyperlipidemia -Continue Atorvastatin 40 mg po Daily   GERD/GI Prophylaxis -C/w Pantoprazole 40 mg po Daily  Hypokalemia -K+ Trend: Recent Labs  Lab 10/06/22 0557 10/07/22 0411 10/08/22 0340 10/09/22 0356 10/10/22 0053 10/11/22 0303 10/12/22 0322  K 3.8 3.7 3.6 3.5 3.2* 3.2* 3.2*  -Replete with po Kcl 40 mEQ BID x2 -Continue to Monitor and Replete as Necessary -Repeat CMP in the the AM   Normocytic Anemia -Hgb/Hct Trend: Recent Labs  Lab 10/06/22 0557 10/07/22 0411 10/08/22 0340 10/09/22 0356 10/10/22 0053 10/11/22 0303 10/12/22 0322  HGB 12.1* 11.9* 12.4* 14.2 13.9 13.3 13.4  HCT 33.9* 35.1* 34.4* 40.7 39.7 37.5* 37.7*  MCV 98.3 100.0 96.6 100.0 98.3 97.7 96.2  -Improved. And will need to Continue to Monitor for S/Sx of Active Bleeding; no overt bleeding noted -Repeat CBC in the AM  DVT prophylaxis: enoxaparin (LOVENOX) injection 40 mg Start: 10/03/22 1000    Code Status: Full Code Family Communication: No family present at bedside   Disposition Plan:  Level of care: Progressive Status is: Inpatient Remains inpatient appropriate because: Needs SNF and bed availability    Consultants:  PCCM   Procedures:  As delineated as above  Antimicrobials:  Anti-infectives (From admission, onward)    Start      Dose/Rate Route Frequency Ordered Stop   10/11/22 1000  azithromycin (ZITHROMAX) tablet 500 mg        500 mg Oral Daily 10/11/22 0751 10/14/22 0959   10/09/22 0800  cefTRIAXone (ROCEPHIN) 2 g in sodium chloride 0.9 % 100 mL IVPB        2 g 200 mL/hr over 30 Minutes Intravenous Every 24 hours 10/09/22 0654 10/14/22 0759   10/09/22 0800  azithromycin (ZITHROMAX) 500 mg in sodium chloride 0.9 % 250 mL IVPB  Status:  Discontinued        500 mg 250 mL/hr over 60 Minutes Intravenous Every 24 hours 10/09/22 0654 10/11/22 0751       Subjective: Seen and Examined at bedside and thinks he is doing better.  Thinks that he is back on his baseline oxygen.  Continues to have some pain.  Nursing states that she is extremely weak and needs more physical therapy.  Patient denies any other concerns or complaints at this time and feels okay.  Objective: Vitals:   10/11/22 2043 10/11/22 2319 10/12/22 0324 10/12/22 0545  BP: (!) 131/91 120/88 (!) 138/94   Pulse: (!) 110 98 (!) 101   Resp: 19 13 19    Temp:  98.1 F (36.7 C) 97.9 F (  36.6 C)   TempSrc:  Oral Oral   SpO2: 95% 95% 93%   Weight:    99.5 kg  Height:        Intake/Output Summary (Last 24 hours) at 10/12/2022 0752 Last data filed at 10/12/2022 0329 Gross per 24 hour  Intake 460 ml  Output 1050 ml  Net -590 ml   Filed Weights   10/10/22 0500 10/11/22 0358 10/12/22 0545  Weight: 98.5 kg 99 kg 99.5 kg   Examination: Physical Exam:  Constitutional: WN/WD overweight Caucasian male in NAD Respiratory: Diminished to auscultation bilaterally with coarse breath sounds, no wheezing, rales, rhonchi or crackles. Normal respiratory effort and patient is not tachypenic. No accessory muscle use. Unlabored breathing but wearing Supplemental O2 via [place] Cardiovascular: RRR, no murmurs / rubs / gallops. S1 and S2 auscultated. No extremity edema. Abdomen: Soft, non-tender, Distended 2/2 body habitus. Bowel sounds positive.  GU:  Deferred. Musculoskeletal: No clubbing / cyanosis of digits/nails. No joint deformity upper and lower extremities.  Skin: No rashes, lesions, ulcers on a limited skin evaluation. No induration; Warm and dry.  Neurologic: CN 2-12 grossly intact with no focal deficits. Romberg sign and cerebellar reflexes not assessed.  Psychiatric: Normal judgment and insight. Alert and oriented x 3. Normal mood and appropriate affect.   Data Reviewed: I have personally reviewed following labs and imaging studies  CBC: Recent Labs  Lab 10/08/22 0340 10/09/22 0356 10/10/22 0053 10/11/22 0303 10/12/22 0322  WBC 6.2 13.0* 8.9 8.9 7.6  NEUTROABS 4.1 11.1* 6.7 6.4 5.7  HGB 12.4* 14.2 13.9 13.3 13.4  HCT 34.4* 40.7 39.7 37.5* 37.7*  MCV 96.6 100.0 98.3 97.7 96.2  PLT 161 199 197 220 256   Basic Metabolic Panel: Recent Labs  Lab 10/08/22 0340 10/09/22 0356 10/10/22 0053 10/11/22 0303 10/12/22 0322  NA 135 135 136 133* 136  K 3.6 3.5 3.2* 3.2* 3.2*  CL 104 101 104 102 106  CO2 22 24 25  21* 23  GLUCOSE 102* 118* 96 125* 110*  BUN 12 19 24* 18 13  CREATININE 0.75 0.82 0.74 0.75 0.70  CALCIUM 8.9 9.2 8.9 8.7* 8.9  MG 1.9 2.0 2.2 1.8 1.8   GFR: Estimated Creatinine Clearance: 114.5 mL/min (by C-G formula based on SCr of 0.7 mg/dL). Liver Function Tests: Recent Labs  Lab 10/06/22 0557 10/07/22 0411 10/08/22 0340 10/09/22 0356 10/10/22 0053  AST 30 33 29 37 29  ALT 29 28 29  38 39  ALKPHOS 88 88 88 98 96  BILITOT 0.6 0.7 0.9 0.9 0.9  PROT 6.5 6.2* 6.2* 7.4 7.0  ALBUMIN 3.5 3.4* 3.4* 3.9 3.6   No results for input(s): "LIPASE", "AMYLASE" in the last 168 hours. Recent Labs  Lab 10/09/22 0703  AMMONIA 25   Coagulation Profile: No results for input(s): "INR", "PROTIME" in the last 168 hours. Cardiac Enzymes: No results for input(s): "CKTOTAL", "CKMB", "CKMBINDEX", "TROPONINI" in the last 168 hours. BNP (last 3 results) No results for input(s): "PROBNP" in the last 8760  hours. HbA1C: No results for input(s): "HGBA1C" in the last 72 hours. CBG: No results for input(s): "GLUCAP" in the last 168 hours. Lipid Profile: No results for input(s): "CHOL", "HDL", "LDLCALC", "TRIG", "CHOLHDL", "LDLDIRECT" in the last 72 hours. Thyroid Function Tests: No results for input(s): "TSH", "T4TOTAL", "FREET4", "T3FREE", "THYROIDAB" in the last 72 hours. Anemia Panel: No results for input(s): "VITAMINB12", "FOLATE", "FERRITIN", "TIBC", "IRON", "RETICCTPCT" in the last 72 hours. Sepsis Labs: Recent Labs  Lab 10/09/22 0703 10/09/22 10/11/22  PROCALCITON <0.10  --   LATICACIDVEN 0.8 0.8    No results found for this or any previous visit (from the past 240 hour(s)).   Radiology Studies: No results found.  Scheduled Meds:  atorvastatin  40 mg Oral Daily   azithromycin  500 mg Oral Daily   budesonide (PULMICORT) nebulizer solution  0.5 mg Nebulization BID   Chlorhexidine Gluconate Cloth  6 each Topical Daily   enoxaparin (LOVENOX) injection  40 mg Subcutaneous Daily   folic acid  1 mg Oral Daily   ipratropium-albuterol  3 mL Nebulization BID   lidocaine  1 patch Transdermal QHS   lisinopril  10 mg Oral Daily   metoprolol succinate  25 mg Oral Daily   multivitamin with minerals  1 tablet Oral Daily   pantoprazole  40 mg Oral Daily   roflumilast  500 mcg Oral Daily   senna-docusate  1 tablet Oral BID   tamsulosin  0.4 mg Oral Daily   theophylline  400 mg Oral Daily   thiamine  100 mg Oral Daily   Continuous Infusions:  cefTRIAXone (ROCEPHIN)  IV Stopped (10/11/22 1152)    LOS: 9 days   Raiford Noble, DO [facility] Available via Epic secure chat 7am-7pm After these hours, please refer to coverage provider listed on [email or web address] 10/12/2022, 7:52 AM

## 2022-10-12 NOTE — TOC Progression Note (Addendum)
Transition of Care [facility]) - Progression Note    Patient Details  Name: [name] MRN: [id number] Date of Birth: 1956/06/27  Transition of [facility]) CM/SW Contact  Vinie Sill, [place] Phone Number: 10/12/2022, 3:57 PM  Clinical Narrative:     4:31p- spoke with patient's daughter by phone- she confirmed family remains interested [facility] or [facility].  CSW will continue to follw and confirm placement .  3:57p-Called [facility] - left voice message   Expected Discharge Plan: [place] Barriers to Discharge: Continued Medical Work up, SNF Pending bed offer  Expected Discharge Plan and Services In-house Referral: Clinical Social Work   Post Acute Care Choice: [place] Living arrangements for the past 2 months: Single Family Home                                       Social Determinants of Health (SDOH) Interventions SDOH Screenings   Food Insecurity: No Food Insecurity (10/03/2022)  Housing: Low Risk  (10/03/2022)  Transportation Needs: No Transportation Needs (10/03/2022)  Utilities: Not At Risk (10/03/2022)  Depression (PHQ2-9): Low Risk  (01/27/2021)  Tobacco Use: Medium Risk (10/02/2022)    Readmission Risk Interventions     No data to display

## 2022-10-13 DIAGNOSIS — F1010 Alcohol abuse, uncomplicated: Secondary | ICD-10-CM

## 2022-10-13 DIAGNOSIS — S32018A Other fracture of first lumbar vertebra, initial encounter for closed fracture: Secondary | ICD-10-CM | POA: Diagnosis not present

## 2022-10-13 DIAGNOSIS — S32010A Wedge compression fracture of first lumbar vertebra, initial encounter for closed fracture: Secondary | ICD-10-CM | POA: Diagnosis not present

## 2022-10-13 DIAGNOSIS — R338 Other retention of urine: Secondary | ICD-10-CM

## 2022-10-13 DIAGNOSIS — F1090 Alcohol use, unspecified, uncomplicated: Secondary | ICD-10-CM | POA: Diagnosis not present

## 2022-10-13 LAB — CBC WITH DIFFERENTIAL/PLATELET
Abs Immature Granulocytes: 0.06 10*3/uL (ref 0.00–0.07)
Basophils Absolute: 0.1 10*3/uL (ref 0.0–0.1)
Basophils Relative: 1 %
Eosinophils Absolute: 0.2 10*3/uL (ref 0.0–0.5)
Eosinophils Relative: 2 %
HCT: 35.9 % — ABNORMAL LOW (ref 39.0–52.0)
Hemoglobin: 12.7 g/dL — ABNORMAL LOW (ref 13.0–17.0)
Immature Granulocytes: 1 %
Lymphocytes Relative: 16 %
Lymphs Abs: 1.1 10*3/uL (ref 0.7–4.0)
MCH: 34 pg (ref 26.0–34.0)
MCHC: 35.4 g/dL (ref 30.0–36.0)
MCV: 96.2 fL (ref 80.0–100.0)
Monocytes Absolute: 0.7 10*3/uL (ref 0.1–1.0)
Monocytes Relative: 10 %
Neutro Abs: 4.7 10*3/uL (ref 1.7–7.7)
Neutrophils Relative %: 70 %
Platelets: 254 10*3/uL (ref 150–400)
RBC: 3.73 MIL/uL — ABNORMAL LOW (ref 4.22–5.81)
RDW: 12 % (ref 11.5–15.5)
WBC: 6.8 10*3/uL (ref 4.0–10.5)
nRBC: 0 % (ref 0.0–0.2)

## 2022-10-13 LAB — COMPREHENSIVE METABOLIC PANEL
ALT: 36 U/L (ref 0–44)
AST: 32 U/L (ref 15–41)
Albumin: 3.2 g/dL — ABNORMAL LOW (ref 3.5–5.0)
Alkaline Phosphatase: 98 U/L (ref 38–126)
Anion gap: 7 (ref 5–15)
BUN: 9 mg/dL (ref 8–23)
CO2: 22 mmol/L (ref 22–32)
Calcium: 8.6 mg/dL — ABNORMAL LOW (ref 8.9–10.3)
Chloride: 104 mmol/L (ref 98–111)
Creatinine, Ser: 0.69 mg/dL (ref 0.61–1.24)
GFR, Estimated: >60 mL/min (ref >60)
Glucose, Bld: 108 mg/dL — ABNORMAL HIGH (ref 70–99)
Potassium: 3.6 mmol/L (ref 3.5–5.1)
Sodium: 133 mmol/L — ABNORMAL LOW (ref 135–145)
Total Bilirubin: 0.5 mg/dL (ref 0.3–1.2)
Total Protein: 6.5 g/dL (ref 6.5–8.1)

## 2022-10-13 LAB — MAGNESIUM: Magnesium: 2 mg/dL (ref 1.7–2.4)

## 2022-10-13 LAB — PHOSPHORUS: Phosphorus: 2.8 mg/dL (ref 2.5–4.6)

## 2022-10-13 MED ORDER — LISINOPRIL 10 MG PO TABS: 10.0000 mg | ORAL_TABLET | Freq: Every day | ORAL | 0 refills | Status: DC

## 2022-10-13 MED ORDER — SENNOSIDES-DOCUSATE SODIUM 8.6-50 MG PO TABS: 1.0000 | ORAL_TABLET | Freq: Every day | ORAL | Status: DC

## 2022-10-13 MED ORDER — FOLIC ACID 1 MG PO TABS: 1.0000 mg | ORAL_TABLET | Freq: Every day | ORAL | 0 refills | Status: DC

## 2022-10-13 MED ORDER — LIDOCAINE 5 % EX PTCH: 1.0000 | MEDICATED_PATCH | Freq: Every day | CUTANEOUS | 0 refills | Status: DC

## 2022-10-13 MED ORDER — VITAMIN B-1 100 MG PO TABS: 100.0000 mg | ORAL_TABLET | Freq: Every day | ORAL | 0 refills | Status: DC

## 2022-10-13 MED ORDER — POTASSIUM CHLORIDE CRYS ER 20 MEQ PO TBCR
40.0000 meq | EXTENDED_RELEASE_TABLET | Freq: Once | ORAL | Status: AC
  Administered 2022-10-13: 40 meq via ORAL
  Filled 2022-10-13: qty 2

## 2022-10-13 MED ORDER — ADULT MULTIVITAMIN W/MINERALS CH: 1.0000 | ORAL_TABLET | Freq: Every day | ORAL | 0 refills | Status: DC

## 2022-10-13 MED ORDER — HYDRALAZINE HCL 20 MG/ML IJ SOLN: 10.0000 mg | Freq: Four times a day (QID) | INTRAMUSCULAR | Status: DC | PRN

## 2022-10-13 MED ORDER — POLYETHYLENE GLYCOL 3350 17 G PO PACK: 17.0000 g | PACK | Freq: Every day | ORAL | 0 refills | Status: DC | PRN

## 2022-10-13 NOTE — Discharge Summary (Signed)
Physician Discharge Summary   Patient: [name] MRN: [id number] DOB: [birth year]  Admit date:     10/02/2022  Discharge date: 10/13/22  Discharge Physician: Charles Noble, DO    PCP: Charles Critchley, MD   Recommendations at discharge:   Follow up with PCP within 1-2 weeks and repeat CBC,CMP, Mag, Phos within 1 week Follow up with Neurosurgery within 1-2 weeks  Discharge Diagnoses: Principal Problem:   Closed compression fracture of body of L1 vertebra (HCC) Active Problems:   L1 vertebral fracture (HCC)   Alcohol use disorder   Chronic respiratory failure with hypoxia ([place])   Acute urinary retention   Hyperlipidemia   Essential hypertension   OSA on CPAP   Acute on chronic respiratory failure with hypoxia (HCC)   COPD with chronic bronchitis and emphysema (HCC)   Alcohol withdrawal (HCC)   Encephalopathy acute   Alcohol abuse  Resolved Problems:   * No resolved hospital problems. [facility] Course: [name] is a 66 yo male with PMH chronic alcohol use, chronic back pain, COPD, chronic hypoxic respiratory failure (on 2 L with CPAP and PRN), OSA on CPAP, HTN, CAD, macular degeneration.  He presented after tripping over oxygen tubing at home and was brought in due to worsening back pain. He underwent imaging of T and L-spine which showed acute L1 compression fracture.  He also has known chronic thoracic compression fractures, chronic pubic rami fractures, right iliac bone fractures, and multiple old right rib fractures.  Case was discussed with neurosurgery and he was recommended for TLSO bracing for comfort when out of bed and outpatient follow-up. He began experiencing alcohol withdrawal soon after admission and was placed on CIWA protocol.    **Interim History  He is improving and getting close back to his baseline. PT/OT recommending SNF and he is medically stable to D/C given that he is out of withdrawals. His Labs are stable and he feels well and will need to follow  up with PCP within 1-2 weeks and Neurosurgery within 1-2 weeks.   Assessment and Plan:  Acute L1 compression fracture -Due to mechanical fall at home (tripped over O2 tubing); patient not felt to be intoxicated upon initial evaluation by response teams -[name] discussed with neurosurgery, recommended for TLSO brace when out of bed for comfort -Outpatient follow-up with neurosurgery -Continue working with PT/OT -Preference is [facility] for rehab at discharge and per [facility] currently not bed availability and SNF requested to call back Thursday or Friday -Dilaudid dose lowered in setting of the rapid responses -If mentation remains stable and no further significant withdrawal anticipating discharge in next 24 to 48 hours -Continue with oxycodone 5 mg every 4 as needed severe pain as well as methocarbamol 5 mg p.o. every 6 as needed for muscle spasms as well as IV hydromorphone 0.5 mg every 4 as needed for severe pain -Also continuing lidocaine 1 patch transdermally every 12 hours -He has a bowel regimen with bisacodyl 10 mg rectal daily as needed, lactulose 20 g p.o. twice daily as needed as well as MiraLAX 17 g p.o. daily as needed for moderate constipation and Senna-Docusate 1 tab po BID    Alcohol Use Disorder Alcohol Withdrawal -Per daughter, patient drinks possibly up to 20 beers per day and patient admits to drinking for over 50 years; patient does underreport usage -Continue CIWA protocol; scores finally downtrending; hopefully he is towards the end of his withdrawal with 1 to 4 mg p.o./IV every as  needed for withdrawal symptoms -Continue thiamine 100 mg p.o. daily, folic acid 1 mg p.o. daily as well as multivitamin with minerals 1 tab p.o. daily   Acute Urinary Retention, improved  -Suspect due to overall deconditioning and worsening back pain with L1 compression fracture.  -Had ongoing retention with bladder scans -He had Foley catheter but will do a trial of void now and do  bladder scans q8h; overnight he had a bladder scan of 390 cc with mild abdominal distention and he had a INO cath improvement.  This morning he voided 600 mL and this afternoon his post void residual was 242 mL today in the afternoon.  He is voiding well but if he starts feeling to void properly will need outpatient follow-up with urology   COPD Acute on chronic respiratory failure with hypoxia -Uses 2 to 3 L oxygen at night normally at home -Suspect multifactorial but some volume s/p resuscitation on admission and atelectasis from being bedbound for several days since admission -S/p steroid dose and lasix dose on 1/20 -Redosed with lasix on 1/21 during rapid response -Continue theophylline 400 mg p.o. daily -Negative PCT, negative BNP, no overt wheezing or volume overload;  -CXR stating "vascular congestion" on 1/21 -SpO2: 93 % O2 Flow Rate (L/min): 2 L/min FiO2 (%): 40 %; Currently not on O2 but states he wears 3 Liters all the time  -Echo reassuring: EF 60 to 65%, no RWMA, indeterminate diastolic parameters -Diuresed well with Lasix dose given on 10/09/2022 -Would benefit from ongoing PT and OOB as well as incentive spirometry -Continuous pulse oximetry maintain O2 saturation greater 90% -Continue supplemental oxygen via nasal cannula wean O2 to home regimen -Will need an amatory home O2 screen as well as repeat chest x-ray prior to discharge   Thrombocytopenia, improved  -Suspect bone marrow suppression due to chronic alcohol use -Platelet Count Trend: Last Labs           Recent Labs  Lab 10/07/22 0411 10/08/22 0340 10/09/22 0356 10/10/22 0053 10/11/22 0303 10/12/22 0322 10/13/22 0201  PLT 153 161 199 197 220 256 254    -Continue to Monitor for S/Sx of Bleeding; No overt bleeding noted -Repeat CBC in the AM    Essential Hypertension -Elevated secondary to pain, continue pain control -Continue Lisinopril 10 mg po Daily and Metoprolol Succinate 25 mg po Daily -C/w  Hydralazine 25 mg po q4hprn for SBP >160 or DBP >100 and Labetalol 10 mg IV q4hprn SBP >160 or DBP >100 -Continue to Monitor BP per Protocol -Last BP reading was elevated at 158/107 on last check   Hyperlipidemia -Continue Atorvastatin 40 mg po Daily    Hyponatremia -Patient's Na+ Trend Last Labs           Recent Labs  Lab 10/07/22 0411 10/08/22 0340 10/09/22 0356 10/10/22 0053 10/11/22 0303 10/12/22 0322 10/13/22 0201  NA 136 135 135 136 133* 136 133*    -Continue to Monitor and Trend and Repeat CMP in the AM    Hypoalbuminemia -Patient's Albumin Level Trend: Last Labs           Recent Labs  Lab 10/05/22 0411 10/06/22 0557 10/07/22 0411 10/08/22 0340 10/09/22 0356 10/10/22 0053 10/13/22 0201  ALBUMIN 3.8 3.5 3.4* 3.4* 3.9 3.6 3.2*    -Continue to Monitor and Trend and repeat CMP in the AM    GERD/GI Prophylaxis -C/w Pantoprazole 40 mg po Daily   Hypokalemia -K+ Trend: Last Labs  Recent Labs  Lab 10/07/22 0411 10/08/22 0340 10/09/22 0356 10/10/22 0053 10/11/22 0303 10/12/22 0322 10/13/22 0201  K 3.7 3.6 3.5 3.2* 3.2* 3.2* 3.6    -Replete with po Kcl 40 mEQ BID x2 -Continue to Monitor and Replete as Necessary -Repeat CMP in the the AM    Normocytic Anemia -Hgb/Hct Trend: Last Labs           Recent Labs  Lab 10/07/22 0411 10/08/22 0340 10/09/22 0356 10/10/22 0053 10/11/22 0303 10/12/22 0322 10/13/22 0201  HGB 11.9* 12.4* 14.2 13.9 13.3 13.4 12.7*  HCT 35.1* 34.4* 40.7 39.7 37.5* 37.7* 35.9*  MCV 100.0 96.6 100.0 98.3 97.7 96.2 96.2    -Improved. And will need to Continue to Monitor for S/Sx of Active Bleeding; no overt bleeding noted -Repeat CBC in the AM   Consultants: PCCM Transfer Procedures performed: As delineated as above  Disposition: Skilled nursing facility Diet recommendation:  Cardiac diet DISCHARGE MEDICATION: Allergies as of 10/13/2022   No Known Allergies      Medication List     STOP taking these  medications    hydrochlorothiazide 12.5 MG capsule Commonly known as: MICROZIDE   predniSONE 10 MG tablet Commonly known as: DELTASONE       TAKE these medications    albuterol (2.5 MG/3ML) 0.083% nebulizer solution Commonly known as: PROVENTIL Take 3 mLs (2.5 mg total) by nebulization every 6 (six) hours as needed for wheezing or shortness of breath. What changed: Another medication with the same name was changed. Make sure you understand how and when to take each.   Ventolin HFA 108 (90 Base) MCG/ACT inhaler Generic drug: albuterol Inhale 2 puffs every 4-6 hours- rescue What changed:  how much to take how to take this when to take this reasons to take this   aspirin EC 81 MG tablet Take 81 mg by mouth daily.   atorvastatin 40 MG tablet Commonly known as: LIPITOR TAKE 1 TABLET BY MOUTH ONCE DAILY   cyclobenzaprine 5 MG tablet Commonly known as: FLEXERIL Take 5 mg by mouth 3 (three) times daily as needed for muscle spasms.   fluocinonide 0.05 % external solution Commonly known as: LIDEX Apply 1 Application topically 2 (two) times daily as needed (skin irritation).   folic acid 1 MG tablet Commonly known as: FOLVITE Take 1 tablet (1 mg total) by mouth daily. Start taking on: October 14, 2022   ibuprofen 200 MG tablet Commonly known as: ADVIL Take 400-600 mg by mouth every 6 (six) hours as needed for mild pain.   ketoconazole 2 % cream Commonly known as: NIZORAL Apply 1 Application topically 2 (two) times daily as needed for irritation.   ketoconazole 2 % shampoo Commonly known as: NIZORAL Apply 1 Application topically 2 (two) times a week.   lidocaine 5 % Commonly known as: LIDODERM Place 1 patch onto the skin at bedtime. Remove & Discard patch within 12 hours or as directed by MD   lisinopril 10 MG tablet Commonly known as: ZESTRIL Take 1 tablet (10 mg total) by mouth daily. Start taking on: October 14, 2022 What changed:  medication strength how  much to take   metoprolol succinate 25 MG 24 hr tablet Commonly known as: TOPROL-XL Take 25 mg by mouth daily.   MULTI FOR HIM 50+ PO Take 1 tablet by mouth daily.   multivitamin with minerals Tabs tablet Take 1 tablet by mouth daily. Start taking on: October 14, 2022   OXYGEN Inhale 3 L into the  lungs at bedtime.   pantoprazole sodium 40 mg Commonly known as: PROTONIX Take 40 mg by mouth daily.   polyethylene glycol 17 g packet Commonly known as: MIRALAX / GLYCOLAX Take 17 g by mouth daily as needed for moderate constipation.   roflumilast 500 MCG Tabs tablet Commonly known as: DALIRESP Take 0.5 tablets (250 mcg total) by mouth daily for 28 days, THEN 1 tablet (500 mcg total) daily. Start taking on: July 15, 2022   senna-docusate 8.6-50 MG tablet Commonly known as: Senokot-S Take 1 tablet by mouth at bedtime.   tamsulosin 0.4 MG Caps capsule Commonly known as: FLOMAX Take 0.4 mg by mouth daily.   theophylline 400 MG 24 hr capsule Commonly known as: THEO-24 Take 200 mg by mouth 2 (two) times daily. What changed: Another medication with the same name was removed. Continue taking this medication, and follow the directions you see here.   thiamine 100 MG tablet Commonly known as: Vitamin B-1 Take 1 tablet (100 mg total) by mouth daily. Start taking on: October 14, 2022   Trelegy Ellipta 100-62.5-25 MCG/ACT Aepb Generic drug: Fluticasone-Umeclidin-Vilant INHALE 1 PUFF INTO THE LUNGS EVERY DAY AT THE SAME TIME EACH DAY What changed: See the new instructions.   vitamin D3 25 MCG tablet Commonly known as: CHOLECALCIFEROL Place 2 tablets (2,000 Units total) into feeding tube daily. What changed: how to take this       Discharge Exam: Filed Weights   10/11/22 0358 10/12/22 0545 10/13/22 0500  Weight: 99 kg 99.5 kg 97 kg   Vitals:   10/13/22 0302 10/13/22 0736  BP: 125/87 (!) 158/107  Pulse: 88 95  Resp: 18 20  Temp: 98 F (36.7 C) 97.9 F (36.6 C)   SpO2: 94% 93%   Examination: Physical Exam:  Constitutional: WN/WD overweight Caucasian male in NAD  Respiratory: Diminished to auscultation bilaterally, no wheezing, rales, rhonchi or crackles. Normal respiratory effort and patient is not tachypenic. No accessory muscle use.  Cardiovascular: RRR, no murmurs / rubs / gallops. S1 and S2 auscultated. No extremity edema. 2+ pedal pulses. No carotid bruits.  Abdomen: Soft, non-tender, Distended 2/2 body habitus. Bowel sounds positive.  GU: Deferred. Musculoskeletal: No clubbing / cyanosis of digits/nails. No joint deformity upper and lower extremities.  Skin: No rashes, lesions, ulcers on a limited skin evaluation. No induration; Warm and dry.  Neurologic: CN 2-12 grossly intact with no focal deficits but has slight tremors. Romberg sign and cerebellar reflexes not assessed.  Psychiatric: Normal judgment and insight. Alert and oriented x 3. Normal mood and appropriate affect.   Condition at discharge: stable  The results of significant diagnostics from this hospitalization (including imaging, microbiology, ancillary and laboratory) are listed below for reference.   Imaging Studies: ECHOCARDIOGRAM COMPLETE  Result Date: 10/09/2022    ECHOCARDIOGRAM REPORT   Patient Name:   [name] Date of Exam: 10/09/2022 Medical Rec #:  [id number]        Height:       74.0 in Accession #:    [id number]       Weight:       230.0 lb Date of Birth:  09-Jun-1956       BSA:          2.306 m Patient Age:    50 years         BP:           117/83 mmHg Patient Gender: M  HR:           115 bpm. Exam Location:  Inpatient Procedure: 2D Echo, Cardiac Doppler, Color Doppler and Intracardiac            Opacification Agent Indications:    CHF-acute diastolic  History:        Patient has no prior history of Echocardiogram examinations.                 COPD; Risk Factors:Sleep Apnea, Hypertension and Former Smoker.                 ETOH abuse.  Sonographer:     Ross LudwigArthur Guy RDCS (AE) Referring Phys: Hillary BowJARED M GARDNER  Sonographer Comments: Technically challenging study due to limited acoustic windows, Technically difficult study due to poor echo windows, suboptimal parasternal window, suboptimal apical window and suboptimal subcostal window. Image acquisition challenging due to COPD and Image acquisition challenging due to respiratory motion. IMPRESSIONS  1. Technically difficult study. Left ventricular ejection fraction, by estimation, is 60 to 65%. The left ventricle has normal function. The left ventricle has no regional wall motion abnormalities. Left ventricular diastolic parameters are indeterminate.  2. Right ventricular systolic function is normal. The right ventricular size is mildly enlarged.  3. The mitral valve is normal in structure. No evidence of mitral valve regurgitation.  4. The aortic valve was not well visualized. Aortic valve regurgitation is not visualized. No aortic stenosis is present.  5. The inferior vena cava is normal in size with greater than 50% respiratory variability, suggesting right atrial pressure of 3 mmHg. FINDINGS  Left Ventricle: Left ventricular ejection fraction, by estimation, is 60 to 65%. The left ventricle has normal function. The left ventricle has no regional wall motion abnormalities. Definity contrast agent was given IV to delineate the left ventricular  endocardial borders. The left ventricular internal cavity size was normal in size. There is no left ventricular hypertrophy. Left ventricular diastolic parameters are indeterminate. Right Ventricle: The right ventricular size is mildly enlarged. Right vetricular wall thickness was not well visualized. Right ventricular systolic function is normal. Left Atrium: Left atrial size was normal in size. Right Atrium: Right atrial size was normal in size. Pericardium: There is no evidence of pericardial effusion. Mitral Valve: The mitral valve is normal in structure. No evidence of  mitral valve regurgitation. Tricuspid Valve: The tricuspid valve is normal in structure. Tricuspid valve regurgitation is not demonstrated. Aortic Valve: The aortic valve was not well visualized. Aortic valve regurgitation is not visualized. No aortic stenosis is present. Aortic valve mean gradient measures 4.0 mmHg. Aortic valve peak gradient measures 7.2 mmHg. Aortic valve area, by VTI measures 3.62 cm. Pulmonic Valve: The pulmonic valve was not well visualized. Pulmonic valve regurgitation is not visualized. Aorta: The aortic root is normal in size and structure. Venous: The inferior vena cava is normal in size with greater than 50% respiratory variability, suggesting right atrial pressure of 3 mmHg. IAS/Shunts: The interatrial septum was not well visualized.  LEFT VENTRICLE PLAX 2D LVIDd:         4.80 cm LVIDs:         4.10 cm LV PW:         1.00 cm LV IVS:        1.10 cm LVOT diam:     2.50 cm LV SV:         69 LV SV Index:   30 LVOT Area:     4.91 cm  RIGHT VENTRICLE  RV Basal diam:  4.30 cm RV Mid diam:    3.90 cm RV S prime:     17.50 cm/s TAPSE (M-mode): 2.4 cm LEFT ATRIUM             Index        RIGHT ATRIUM           Index LA diam:        2.40 cm 1.04 cm/m   RA Area:     14.60 cm LA Vol (A2C):   46.4 ml 20.12 ml/m  RA Volume:   35.20 ml  15.26 ml/m LA Vol (A4C):   27.5 ml 11.92 ml/m LA Biplane Vol: 37.3 ml 16.17 ml/m  AORTIC VALVE AV Area (Vmax):    3.32 cm AV Area (Vmean):   3.04 cm AV Area (VTI):     3.62 cm AV Vmax:           134.00 cm/s AV Vmean:          95.400 cm/s AV VTI:            0.191 m AV Peak Grad:      7.2 mmHg AV Mean Grad:      4.0 mmHg LVOT Vmax:         90.70 cm/s LVOT Vmean:        59.100 cm/s LVOT VTI:          0.141 m LVOT/AV VTI ratio: 0.74  AORTA Ao Root diam: 3.90 cm  SHUNTS Systemic VTI:  0.14 m Systemic Diam: 2.50 cm Epifanio Lescheshristopher Schumann MD Electronically signed by Epifanio Lescheshristopher Schumann MD Signature Date/Time: 10/09/2022/2:35:54 PM    Final    DG Chest Port 1  View  Result Date: 10/09/2022 CLINICAL DATA:  Acute respiratory distress. Evaluate for pleural effusion or edema. EXAM: PORTABLE CHEST 1 VIEW COMPARISON:  10/08/2022 FINDINGS: Enteric tube tip is below the level of the GE junction. Heart size is normal. Pulmonary vascular congestion, but no signs of pleural effusion or frank interstitial edema. No airspace opacities. Previous left shoulder arthroplasty. Remote right posterior rib deformities. IMPRESSION: Pulmonary vascular congestion. Electronically Signed   By: Signa Kellaylor  Stroud M.D.   On: 10/09/2022 06:41   DG CHEST PORT 1 VIEW  Result Date: 10/08/2022 CLINICAL DATA:  Shortness of breath. EXAM: PORTABLE CHEST 1 VIEW COMPARISON:  CT angio chest 10/02/2022 FINDINGS: Heart size appears normal. Increased pulmonary vascularity identified. No pleural effusion or edema. No airspace opacities identified. Multiple remote right posterior and lateral rib deformities are identified as seen on CT from 10/02/2022. Status post left shoulder arthroplasty. IMPRESSION: 1. No acute cardiopulmonary abnormalities. 2. Increased pulmonary vascularity. Electronically Signed   By: Signa Kellaylor  Stroud M.D.   On: 10/08/2022 07:52   CT L-SPINE NO CHARGE  Result Date: 10/02/2022 CLINICAL DATA:  Recent fall with L1 compression fracture on CTA of the abdomen and pelvis EXAM: CT THORACIC SPINE WITHOUT CONTRAST; CT LUMBAR SPINE WITHOUT CONTRAST TECHNIQUE: Multidetector CT images of the thoracic and lumbar spine. Were obtained using the standard protocol without intravenous contrast. RADIATION DOSE REDUCTION: This exam was performed according to the departmental dose-optimization program which includes automated exposure control, adjustment of the mA and/or kV according to patient size and/or use of iterative reconstruction technique. COMPARISON:  02/02/2022 FINDINGS: CT of the thoracic spine: Alignment: Normal sick kyphosis is noted. Vertebrae: 12 thoracic vertebra are noted. Chronic  compression fractures are noted at T1, T3 and T2 for. These are stable from prior CT from May of 2023.  Multilevel osteophytic changes are noted. No other fractures are noted. Multiple right-sided rib fractures are noted with varying degrees of healing similar to that seen on recent CTA of the chest. Paraspinal and other soft tissues: Paraspinal soft tissues are within normal limits. No focal hematoma is noted. Disc levels: No significant acute disc pathology is noted CT of lumbar spine: Alignment: Normal lordosis is noted. Vertebrae: 5 lumbar type vertebral bodies are well visualized. Vertebral body height is well maintained with the exception of an acute L1 compression fracture with approximately 30% height loss identified. No other compression fractures are seen. Changes of prior screw fixation of the sacroiliac joints are seen. Prior right iliac bone fracture adjacent to the SI joint is seen with callus formation consistent with healing. Paraspinal and other soft tissues: Paraspinal soft tissues are within normal limits. Disc levels: No specific disc pathology is noted. IMPRESSION: CT of the thoracic spine chronic compression deformities as described. Multiple old right rib fractures are seen. CT of the lumbar spine: Acute L1 compression fracture with 30% height loss identified. Changes of prior SI joint fixation and the right iliac bone fracture with healing are seen. Electronically Signed   By: Inez Catalina M.D.   On: 10/02/2022 21:31   CT T-SPINE NO CHARGE  Result Date: 10/02/2022 CLINICAL DATA:  Recent fall with L1 compression fracture on CTA of the abdomen and pelvis EXAM: CT THORACIC SPINE WITHOUT CONTRAST; CT LUMBAR SPINE WITHOUT CONTRAST TECHNIQUE: Multidetector CT images of the thoracic and lumbar spine. Were obtained using the standard protocol without intravenous contrast. RADIATION DOSE REDUCTION: This exam was performed according to the departmental dose-optimization program which includes  automated exposure control, adjustment of the mA and/or kV according to patient size and/or use of iterative reconstruction technique. COMPARISON:  02/02/2022 FINDINGS: CT of the thoracic spine: Alignment: Normal sick kyphosis is noted. Vertebrae: 12 thoracic vertebra are noted. Chronic compression fractures are noted at T1, T3 and T2 for. These are stable from prior CT from May of 2023. Multilevel osteophytic changes are noted. No other fractures are noted. Multiple right-sided rib fractures are noted with varying degrees of healing similar to that seen on recent CTA of the chest. Paraspinal and other soft tissues: Paraspinal soft tissues are within normal limits. No focal hematoma is noted. Disc levels: No significant acute disc pathology is noted CT of lumbar spine: Alignment: Normal lordosis is noted. Vertebrae: 5 lumbar type vertebral bodies are well visualized. Vertebral body height is well maintained with the exception of an acute L1 compression fracture with approximately 30% height loss identified. No other compression fractures are seen. Changes of prior screw fixation of the sacroiliac joints are seen. Prior right iliac bone fracture adjacent to the SI joint is seen with callus formation consistent with healing. Paraspinal and other soft tissues: Paraspinal soft tissues are within normal limits. Disc levels: No specific disc pathology is noted. IMPRESSION: CT of the thoracic spine chronic compression deformities as described. Multiple old right rib fractures are seen. CT of the lumbar spine: Acute L1 compression fracture with 30% height loss identified. Changes of prior SI joint fixation and the right iliac bone fracture with healing are seen. Electronically Signed   By: Inez Catalina M.D.   On: 10/02/2022 21:31   CT Angio Chest/Abd/Pel for Dissection W and/or Wo Contrast  Result Date: 10/02/2022 CLINICAL DATA:  Recent trip and fall with back pain, initial encounter EXAM: CT ANGIOGRAPHY CHEST, ABDOMEN  AND PELVIS TECHNIQUE: Non-contrast CT of the chest  was initially obtained. Multidetector CT imaging through the chest, abdomen and pelvis was performed using the standard protocol during bolus administration of intravenous contrast. Multiplanar reconstructed images and MIPs were obtained and reviewed to evaluate the vascular anatomy. RADIATION DOSE REDUCTION: This exam was performed according to the departmental dose-optimization program which includes automated exposure control, adjustment of the mA and/or kV according to patient size and/or use of iterative reconstruction technique. CONTRAST:  111mL OMNIPAQUE IOHEXOL 350 MG/ML SOLN COMPARISON:  08/12/2022 FINDINGS: CTA CHEST FINDINGS Cardiovascular: Initial noncontrast images demonstrate no significant atherosclerotic calcifications of the thoracic aorta. Post-contrast images demonstrate a normal branching pattern of the thoracic aorta. No evidence of aneurysmal dilatation or dissection is seen. No cardiac enlargement is noted. Pulmonary artery as visualized is within normal limits. No significant coronary calcifications are seen. Mediastinum/Nodes: Thoracic inlet is within normal limits. No hilar or mediastinal adenopathy is noted. The esophagus is within normal limits. Lungs/Pleura: Lungs are well aerated bilaterally. Emphysematous changes are seen. No focal infiltrate or sizable effusion is seen. No pneumothorax is noted. Musculoskeletal: Multiple old rib fractures with healing are noted on the right posteriorly. No acute rib fracture is seen. Prior shoulder replacement on the left is noted. Multiple chronic appearing compression deformities are noted involving T1, T3 and T4. Additionally, an acute appearing L1 fracture is noted with proximally 30% vertebral body height loss. Review of the MIP images confirms the above findings. CTA ABDOMEN AND PELVIS FINDINGS VASCULAR Aorta: Atherosclerotic calcifications are noted without aneurysmal dilatation or dissection.  Celiac: Patent without evidence of aneurysm, dissection, vasculitis or significant stenosis. SMA: Patent without evidence of aneurysm, dissection, vasculitis or significant stenosis. Renals: Single renal artery is noted on right with dual renal arteries on the left. Mild atherosclerotic changes are noted. No focal stenoses are seen. IMA: Patent without evidence of aneurysm, dissection, vasculitis or significant stenosis. Inflow: Iliacs show atherosclerotic calcification without focal abnormality. Veins: No specific venous abnormality is seen. Review of the MIP images confirms the above findings. NON-VASCULAR Hepatobiliary: No focal liver abnormality is seen. No gallstones, gallbladder wall thickening, or biliary dilatation. Pancreas: Unremarkable. No pancreatic ductal dilatation or surrounding inflammatory changes. Spleen: Normal in size without focal abnormality. Adrenals/Urinary Tract: Adrenal glands are within normal limits. Kidneys show a normal enhancement pattern bilaterally. Cysts are noted bilaterally the largest of which seen in the upper pole of the right kidney measuring 4.3 cm. These are simple in nature and no further follow-up is recommended. No renal calculi or obstructive changes are seen. The bladder is well distended. Stomach/Bowel: Scattered diverticular change of the colon is noted. No evidence of diverticulitis is seen. The appendix is air-filled and within normal limits. Small bowel and stomach are unremarkable. Lymphatic: No sizable adenopathy is noted. Reproductive: Prostate is unremarkable. Other: No abdominal wall hernia or abnormality. No abdominopelvic ascites. Musculoskeletal: Postsurgical changes are noted in the right hip. Prior pubic rami fractures with healing are noted on the right. 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 These changes are stable from the prior study. Prior screw fixation of the sacroiliac joints is seen. Fracture involving the right iliac bone  adjacent to the sacroiliac joint is again seen with some callus formation consistent with. The known L1 compression fracture is seen. Again approximately 30% height loss is noted. Review of the MIP images confirms the above findings. IMPRESSION: CTA of the chest: No evidence of aortic dissection or pulmonary emboli. Multiple old rib fractures are identified on the right. Additionally multiple chronic  appearing compression deformities are noted throughout the thoracic spine as described. These are stable from prior CT from May of 2023. CTA of the abdomen and pelvis: No arterial abnormality is noted. Acute L1 compression fracture. 30% height loss is noted. Diverticulosis without diverticulitis. Chronic pubic rami fractures and right iliac bone fractures as described Electronically Signed   By: Alcide Clever M.D.   On: 10/02/2022 21:24    Microbiology: Results for orders placed or performed during the hospital encounter of 08/30/21  Culture, Respiratory w Gram Stain     Status: None   Collection Time: 09/09/21  3:45 PM   Specimen: Tracheal Aspirate; Respiratory  Result Value Ref Range Status   Specimen Description TRACHEAL ASPIRATE  Final   Special Requests NONE  Final   Gram Stain   Final    FEW SQUAMOUS EPITHELIAL CELLS PRESENT MODERATE WBC PRESENT, PREDOMINANTLY PMN MODERATE GRAM NEGATIVE COCCOBACILLI FEW GRAM POSITIVE COCCI RARE GRAM POSITIVE RODS    Culture   Final    MODERATE Normal respiratory flora-no Staph aureus or Pseudomonas seen Performed at [facility], [address] [place], [place] [address]    Report Status 09/12/2021 FINAL  Final  Urine Culture     Status: Abnormal   Collection Time: 09/11/21  6:55 PM   Specimen: Urine, Clean Catch  Result Value Ref Range Status   Specimen Description URINE, CLEAN CATCH  Final   Special Requests   Final    NONE Performed at [facility], [address], [place], [place] [address]    Culture >=100,000 COLONIES/mL  ENTEROCOCCUS FAECALIS (A)  Final   Report Status 09/13/2021 FINAL  Final   Organism ID, Bacteria ENTEROCOCCUS FAECALIS (A)  Final      Susceptibility   Enterococcus faecalis - MIC*    AMPICILLIN <=2 SENSITIVE Sensitive     NITROFURANTOIN <=16 SENSITIVE Sensitive     VANCOMYCIN 1 SENSITIVE Sensitive     * >=100,000 COLONIES/mL ENTEROCOCCUS FAECALIS  Culture, body fluid w Gram Stain-bottle     Status: None   Collection Time: 09/21/21 11:26 AM   Specimen: Pleura  Result Value Ref Range Status   Specimen Description PLEURAL  Final   Special Requests NONE  Final   Gram Stain   Final    MODERATE WBC PRESENT, PREDOMINANTLY MONONUCLEAR NO ORGANISMS SEEN    Culture   Final    NO GROWTH 5 DAYS Performed at [facility], [address] [place], [place] [address]    Report Status 09/26/2021 FINAL  Final   Labs: CBC: Recent Labs  Lab 10/09/22 0356 10/10/22 0053 10/11/22 0303 10/12/22 0322 10/13/22 0201  WBC 13.0* 8.9 8.9 7.6 6.8  NEUTROABS 11.1* 6.7 6.4 5.7 4.7  HGB 14.2 13.9 13.3 13.4 12.7*  HCT 40.7 39.7 37.5* 37.7* 35.9*  MCV 100.0 98.3 97.7 96.2 96.2  PLT 199 197 220 256 254   Basic Metabolic Panel: Recent Labs  Lab 10/09/22 0356 10/10/22 0053 10/11/22 0303 10/12/22 0322 10/13/22 0201  NA 135 136 133* 136 133*  K 3.5 3.2* 3.2* 3.2* 3.6  CL 101 104 102 106 104  CO2 24 25 21* 23 22  GLUCOSE 118* 96 125* 110* 108*  BUN 19 24* 18 13 9   CREATININE 0.82 0.74 0.75 0.70 0.69  CALCIUM 9.2 8.9 8.7* 8.9 8.6*  MG 2.0 2.2 1.8 1.8 2.0  PHOS  --   --   --   --  2.8   Liver Function  Tests: Recent Labs  Lab 10/07/22 0411 10/08/22 0340 10/09/22 0356 10/10/22 0053 10/13/22 0201  AST 33 29 37 29 32  ALT 28 29 38 39 36  ALKPHOS 88 88 98 96 98  BILITOT 0.7 0.9 0.9 0.9 0.5  PROT 6.2* 6.2* 7.4 7.0 6.5  ALBUMIN 3.4* 3.4* 3.9 3.6 3.2*   CBG: No results for input(s): "GLUCAP" in the last 168 hours.  Discharge time spent: greater than 30 minutes.  Signed: Raiford Noble, DO [facility] 10/13/2022

## 2022-10-13 NOTE — Progress Notes (Incomplete)
PROGRESS NOTE    [name]  WUX:[id number] DOB: 1956-01-05 DOA: 10/02/2022 PCP: Alease Medina, MD   Brief Narrative:  [name] is a 67 yo male with PMH chronic alcohol use, chronic back pain, COPD, chronic hypoxic respiratory failure (on 2 L with CPAP and PRN), OSA on CPAP, HTN, CAD, macular degeneration.  He presented after tripping over oxygen tubing at home and was brought in due to worsening back pain. He underwent imaging of T and L-spine which showed acute L1 compression fracture.  He also has known chronic thoracic compression fractures, chronic pubic rami fractures, right iliac bone fractures, and multiple old right rib fractures.  Case was discussed with neurosurgery and he was recommended for TLSO bracing for comfort when out of bed and outpatient follow-up. He began experiencing alcohol withdrawal soon after admission and was placed on CIWA protocol.    **Interim History  He is improving and getting close back to his baseline. PT/OT recommending SNF and awaiting bed availability.   Assessment and Plan:  Acute L1 compression fracture -Due to mechanical fall at home (tripped over O2 tubing); patient not felt to be intoxicated upon initial evaluation by response teams -My colleague discussed with neurosurgery, recommended for TLSO brace when out of bed for comfort -Outpatient follow-up with neurosurgery -Continue working with PT/OT -Preference is [facility] for rehab at discharge and per [facility] currently not bed availability and SNF requested to call back Thursday or Friday -Dilaudid dose lowered in setting of the rapid responses -If mentation remains stable and no further significant withdrawal anticipating discharge in next 24 to 48 hours -Continue with oxycodone 5 mg every 4 as needed severe pain as well as methocarbamol 5 mg p.o. every 6 as needed for muscle spasms as well as IV hydromorphone 0.5 mg every 4 as needed for severe pain -Also continuing lidocaine 1 patch  transdermally every 12 hours -He has a bowel regimen with bisacodyl 10 mg rectal daily as needed, lactulose 20 g p.o. twice daily as needed as well as MiraLAX 17 g p.o. daily as needed for moderate constipation and Senna-Docusate 1 tab po BID    Alcohol Use Disorder Alcohol Withdrawal -Per daughter, patient drinks possibly up to 20 beers per day and patient admits to drinking for over 50 years; patient does underreport usage -Continue CIWA protocol; scores finally downtrending; hopefully he is towards the end of his withdrawal with 1 to 4 mg p.o./IV every as needed for withdrawal symptoms -Continue thiamine 100 mg p.o. daily, folic acid 1 mg p.o. daily as well as multivitamin with minerals 1 tab p.o. daily   Acute Urinary Retention -Suspect due to overall deconditioning and worsening back pain with L1 compression fracture.  -Had ongoing retention with bladder scans -He had Foley catheter but will do a trial of void now and do bladder scans q8h but seems he may be failing so will need replacement of Foley and outpatient Urology Follow Up   COPD Acute on chronic respiratory failure with hypoxia -Uses 2 to 3 L oxygen at night normally at home -Suspect multifactorial but some volume s/p resuscitation on admission and atelectasis from being bedbound for several days since admission -S/p steroid dose and lasix dose on 1/20 -Redosed with lasix on 1/21 during rapid response -Continue theophylline 400 mg p.o. daily -Negative PCT, negative BNP, no overt wheezing or volume overload;  -CXR stating "vascular congestion" on 1/21 -SpO2: 93 % O2 Flow Rate (L/min): 2 L/min FiO2 (%): 40 %; Currently  wears 3 Liters all the time  -Echo reassuring: EF 60 to 65%, no RWMA, indeterminate diastolic parameters -Diuresed well with Lasix dose given on 10/09/2022 -Would benefit from ongoing PT and OOB as well as incentive spirometry -Continuous pulse oximetry maintain O2 saturation greater 90% -Continue supplemental  oxygen via nasal cannula wean O2 to home regimen -Will need an amatory home O2 screen as well as repeat chest x-ray prior to discharge   Thrombocytopenia, improved  -Suspect bone marrow suppression due to chronic alcohol use -Platelet Count Trend: Recent Labs  Lab 10/07/22 0411 10/08/22 0340 10/09/22 0356 10/10/22 0053 10/11/22 0303 10/12/22 0322 10/13/22 0201  PLT 153 161 199 197 220 256 254  -Continue to Monitor for S/Sx of Bleeding; No overt bleeding noted -Repeat CBC in the AM    Essential Hypertension -Elevated secondary to pain, continue pain control -Continue Lisinopril 10 mg po Daily and Metoprolol Succinate 25 mg po Daily -C/w Hydralazine 25 mg po q4hprn for SBP >160 or DBP >100 and Labetalol 10 mg IV q4hprn SBP >160 or DBP >100 -Continue to Monitor BP per Protocol -Last BP reading was elevated at 158/107   Hyperlipidemia -Continue Atorvastatin 40 mg po Daily   Hyponatremia -Patient's Na+ Trend Recent Labs  Lab 10/07/22 0411 10/08/22 0340 10/09/22 0356 10/10/22 0053 10/11/22 0303 10/12/22 0322 10/13/22 0201  NA 136 135 135 136 133* 136 133*  -Continue to Monitor and Trend and Repeat CMP in the AM   Hypoalbuminemia -Patient's Albumin Level Trend: Recent Labs  Lab 10/05/22 0411 10/06/22 0557 10/07/22 0411 10/08/22 0340 10/09/22 0356 10/10/22 0053 10/13/22 0201  ALBUMIN 3.8 3.5 3.4* 3.4* 3.9 3.6 3.2*  -Continue to Monitor and Trend and repeat CMP in the AM   GERD/GI Prophylaxis -C/w Pantoprazole 40 mg po Daily   Hypokalemia -K+ Trend: Recent Labs  Lab 10/07/22 0411 10/08/22 0340 10/09/22 0356 10/10/22 0053 10/11/22 0303 10/12/22 0322 10/13/22 0201  K 3.7 3.6 3.5 3.2* 3.2* 3.2* 3.6  -Replete with po Kcl 40 mEQ BID x2 -Continue to Monitor and Replete as Necessary -Repeat CMP in the the AM    Normocytic Anemia -Hgb/Hct Trend: Recent Labs  Lab 10/07/22 0411 10/08/22 0340 10/09/22 0356 10/10/22 0053 10/11/22 0303 10/12/22 0322  10/13/22 0201  HGB 11.9* 12.4* 14.2 13.9 13.3 13.4 12.7*  HCT 35.1* 34.4* 40.7 39.7 37.5* 37.7* 35.9*  MCV 100.0 96.6 100.0 98.3 97.7 96.2 96.2  -Improved. And will need to Continue to Monitor for S/Sx of Active Bleeding; no overt bleeding noted -Repeat CBC in the AM  DVT prophylaxis: enoxaparin (LOVENOX) injection 40 mg Start: 10/03/22 1000    Code Status: Full Code Family Communication: ***  Disposition Plan:  Level of care: Progressive Status is: Inpatient {Inpatient:23812}    Consultants:  ***  Procedures:  ***  Antimicrobials:  Anti-infectives (From admission, onward)    Start     Dose/Rate Route Frequency Ordered Stop   10/11/22 1000  azithromycin (ZITHROMAX) tablet 500 mg        500 mg Oral Daily 10/11/22 0751 10/14/22 0959   10/09/22 0800  cefTRIAXone (ROCEPHIN) 2 g in sodium chloride 0.9 % 100 mL IVPB        2 g 200 mL/hr over 30 Minutes Intravenous Every 24 hours 10/09/22 0654 10/14/22 0759   10/09/22 0800  azithromycin (ZITHROMAX) 500 mg in sodium chloride 0.9 % 250 mL IVPB  Status:  Discontinued        500 mg 250 mL/hr over 60 Minutes Intravenous Every  24 hours 10/09/22 0654 10/11/22 0751        Subjective: ***  Objective: Vitals:   10/12/22 2318 10/13/22 0302 10/13/22 0500 10/13/22 0736  BP:  125/87  (!) 158/107  Pulse:  88  95  Resp:  18  20  Temp: 98.8 F (37.1 C) 98 F (36.7 C)  97.9 F (36.6 C)  TempSrc: Oral Oral  Oral  SpO2:  94%  93%  Weight:   97 kg   Height:        Intake/Output Summary (Last 24 hours) at 10/13/2022 0743 Last data filed at 10/13/2022 8469 Gross per 24 hour  Intake 630 ml  Output 1900 ml  Net -1270 ml   Filed Weights   10/11/22 0358 10/12/22 0545 10/13/22 0500  Weight: 99 kg 99.5 kg 97 kg    Examination: Physical Exam:  Constitutional: WN/WD, NAD and appears calm and comfortable Eyes: PERRL, lids and conjunctivae normal, sclerae anicteric  ENMT: External Ears, Nose appear normal. Grossly normal hearing.  Mucous membranes are moist. Posterior pharynx clear of any exudate or lesions. Normal dentition.  Neck: Appears normal, supple, no cervical masses, normal ROM, no appreciable thyromegaly Respiratory: Clear to auscultation bilaterally, no wheezing, rales, rhonchi or crackles. Normal respiratory effort and patient is not tachypenic. No accessory muscle use.  Cardiovascular: RRR, no murmurs / rubs / gallops. S1 and S2 auscultated. No extremity edema. 2+ pedal pulses. No carotid bruits.  Abdomen: Soft, non-tender, non-distended. No masses palpated. No appreciable hepatosplenomegaly. Bowel sounds positive.  GU: Deferred. Musculoskeletal: No clubbing / cyanosis of digits/nails. No joint deformity upper and lower extremities. Good ROM, no contractures. Normal strength and muscle tone.  Skin: No rashes, lesions, ulcers. No induration; Warm and dry.  Neurologic: CN 2-12 grossly intact with no focal deficits. Sensation intact in all 4 Extremities, DTR normal. Strength 5/5 in all 4. Romberg sign cerebellar reflexes not assessed.  Psychiatric: Normal judgment and insight. Alert and oriented x 3. Normal mood and appropriate affect.   Data Reviewed: I have personally reviewed following labs and imaging studies  CBC: Recent Labs  Lab 10/09/22 0356 10/10/22 0053 10/11/22 0303 10/12/22 0322 10/13/22 0201  WBC 13.0* 8.9 8.9 7.6 6.8  NEUTROABS 11.1* 6.7 6.4 5.7 4.7  HGB 14.2 13.9 13.3 13.4 12.7*  HCT 40.7 39.7 37.5* 37.7* 35.9*  MCV 100.0 98.3 97.7 96.2 96.2  PLT 199 197 220 256 629   Basic Metabolic Panel: Recent Labs  Lab 10/09/22 0356 10/10/22 0053 10/11/22 0303 10/12/22 0322 10/13/22 0201  NA 135 136 133* 136 133*  K 3.5 3.2* 3.2* 3.2* 3.6  CL 101 104 102 106 104  CO2 24 25 21* 23 22  GLUCOSE 118* 96 125* 110* 108*  BUN 19 24* 18 13 9   CREATININE 0.82 0.74 0.75 0.70 0.69  CALCIUM 9.2 8.9 8.7* 8.9 8.6*  MG 2.0 2.2 1.8 1.8 2.0  PHOS  --   --   --   --  2.8   GFR: Estimated Creatinine  Clearance: 105.6 mL/min (by C-G formula based on SCr of 0.69 mg/dL). Liver Function Tests: Recent Labs  Lab 10/07/22 0411 10/08/22 0340 10/09/22 0356 10/10/22 0053 10/13/22 0201  AST 33 29 37 29 32  ALT 28 29 38 39 36  ALKPHOS 88 88 98 96 98  BILITOT 0.7 0.9 0.9 0.9 0.5  PROT 6.2* 6.2* 7.4 7.0 6.5  ALBUMIN 3.4* 3.4* 3.9 3.6 3.2*   No results for input(s): "LIPASE", "AMYLASE" in the last 168 hours.  Recent Labs  Lab 10/09/22 0703  AMMONIA 25   Coagulation Profile: No results for input(s): "INR", "PROTIME" in the last 168 hours. Cardiac Enzymes: No results for input(s): "CKTOTAL", "CKMB", "CKMBINDEX", "TROPONINI" in the last 168 hours. BNP (last 3 results) No results for input(s): "PROBNP" in the last 8760 hours. HbA1C: No results for input(s): "HGBA1C" in the last 72 hours. CBG: No results for input(s): "GLUCAP" in the last 168 hours. Lipid Profile: No results for input(s): "CHOL", "HDL", "LDLCALC", "TRIG", "CHOLHDL", "LDLDIRECT" in the last 72 hours. Thyroid Function Tests: No results for input(s): "TSH", "T4TOTAL", "FREET4", "T3FREE", "THYROIDAB" in the last 72 hours. Anemia Panel: No results for input(s): "VITAMINB12", "FOLATE", "FERRITIN", "TIBC", "IRON", "RETICCTPCT" in the last 72 hours. Sepsis Labs: Recent Labs  Lab 10/09/22 0703 10/09/22 0958  PROCALCITON <0.10  --   LATICACIDVEN 0.8 0.8    No results found for this or any previous visit (from the past 240 hour(s)).   Radiology Studies: No results found.   Scheduled Meds:  atorvastatin  40 mg Oral Daily   azithromycin  500 mg Oral Daily   budesonide (PULMICORT) nebulizer solution  0.5 mg Nebulization BID   Chlorhexidine Gluconate Cloth  6 each Topical Daily   enoxaparin (LOVENOX) injection  40 mg Subcutaneous Daily   folic acid  1 mg Oral Daily   ipratropium-albuterol  3 mL Nebulization BID   lidocaine  1 patch Transdermal QHS   lisinopril  10 mg Oral Daily   metoprolol succinate  25 mg Oral Daily    multivitamin with minerals  1 tablet Oral Daily   pantoprazole  40 mg Oral Daily   roflumilast  500 mcg Oral Daily   senna-docusate  1 tablet Oral BID   tamsulosin  0.4 mg Oral Daily   theophylline  400 mg Oral Daily   thiamine  100 mg Oral Daily   Continuous Infusions:  cefTRIAXone (ROCEPHIN)  IV Stopped (10/12/22 1030)     LOS: 10 days    Time spent: *** minutes spent on chart review, discussion with nursing staff, consultants, updating family and interview/physical exam; more than 50% of that time was spent in counseling and/or coordination of care.   Kerney Elbe, DO [facility] Available via Epic secure chat 7am-7pm After these hours, please refer to coverage provider listed on [email or web address] 10/13/2022, 7:43 AM

## 2022-10-13 NOTE — Progress Notes (Signed)
Occupational Therapy Treatment Patient Details Name: [name] MRN: [id number] DOB: 14-Sep-1956 Today's Date: 10/13/2022   History of present illness 67 y.o. male presents to [facility] hospital on 10/02/2022 after fall. CT scan demonstrates new L1 compression fx. PMH: Chronic back pain, alcohol use, COPD, chronic hypoxic respiratory failure (on 2 L with CPAP and PRN), OSA on CPAP, HTN, CAD, macular degeneration, tracheostomy 07/2021.   OT comments  Patient received in supine and eager to participate. Patient able to recall 2/3 precautions, no bending or lifting, and continued to review with patient patient continues to require max assist to donn brace and mod assist of one from 2 for transfer to recliner. Patient making gains with grooming requiring setup only seated. Patient is making good progress and will continue to be followed by acute OT to address functional transfers and self care.    Recommendations for follow up therapy are one component of a multi-disciplinary discharge planning process, led by the attending physician.  Recommendations may be updated based on patient status, additional functional criteria and insurance authorization.    Follow Up Recommendations  Skilled nursing-short term rehab (<3 hours/day)     Assistance Recommended at Discharge Frequent or constant Supervision/Assistance  Patient can return home with the following  A lot of help with bathing/dressing/bathroom;Assistance with cooking/housework;Help with stairs or ramp for entrance;Assistance with feeding;Assist for transportation;A lot of help with walking and/or transfers   Equipment Recommendations  Other (comment) (defer to next venue of care)    Recommendations for Other Services      Precautions / Restrictions Precautions Precautions: Fall;Back Precaution Comments: education on back precautions with patient repeating throughout session Required Braces or Orthoses: Spinal Brace Spinal Brace:  Thoracolumbosacral orthotic;Applied in sitting position       Mobility Bed Mobility Overal bed mobility: Needs Assistance Bed Mobility: Sidelying to Sit, Rolling Rolling: Min assist Sidelying to sit: Min assist       General bed mobility comments: cues for log rolling technique and assistance to raise trunk    Transfers Overall transfer level: Needs assistance Equipment used: Rolling walker (2 wheels) Transfers: Sit to/from Stand, Bed to chair/wheelchair/BSC Sit to Stand: Min assist     Step pivot transfers: Mod assist     General transfer comment: cues for hand placement and cues throughtout for breathing     Balance Overall balance assessment: Needs assistance Sitting-balance support: Feet supported, Single extremity supported, No upper extremity supported Sitting balance-Leahy Scale: Fair Sitting balance - Comments: can sit without UE support   Standing balance support: Bilateral upper extremity supported Standing balance-Leahy Scale: Poor Standing balance comment: reliant on RW for support                           ADL either performed or assessed with clinical judgement   ADL Overall ADL's : Needs assistance/impaired     Grooming: Wash/dry hands;Wash/dry face;Oral care;Set up;Sitting           Upper Body Dressing : Maximal assistance;Cueing for sequencing;Sitting Upper Body Dressing Details (indicate cue type and reason): to don TLSO                        Extremity/Trunk Assessment              Vision       Perception     Praxis      Cognition Arousal/Alertness: Awake/alert Behavior During Therapy: Anxious Overall  Cognitive Status: Impaired/Different from baseline Area of Impairment: Attention, Safety/judgement, Memory, Problem solving                   Current Attention Level: Sustained Memory: Decreased short-term memory, Decreased recall of precautions   Safety/Judgement: Decreased awareness of safety    Problem Solving: Difficulty sequencing, Requires verbal cues General Comments: able to recall 2/3 back precautions        Exercises      Shoulder Instructions       General Comments HR to 123 with ambulation in the room, SpO2 on RA 90 or greater    Pertinent Vitals/ Pain       Pain Assessment Pain Assessment: Faces Faces Pain Scale: Hurts even more Pain Location: back with movement Pain Descriptors / Indicators: Discomfort, Grimacing, Moaning Pain Intervention(s): Limited activity within patient's tolerance, Monitored during session, Repositioned  Home Living                                          Prior Functioning/Environment              Frequency  Min 2X/week        Progress Toward Goals  OT Goals(current goals can now be found in the care plan section)  Progress towards OT goals: Progressing toward goals  Acute Rehab OT Goals Patient Stated Goal: get better OT Goal Formulation: With patient Time For Goal Achievement: 10/19/22 Potential to Achieve Goals: Fair ADL Goals Pt Will Perform Eating: with set-up;sitting Pt Will Perform Grooming: with set-up;sitting Pt Will Perform Upper Body Bathing: with set-up;sitting Pt Will Transfer to Toilet: with max assist;stand pivot transfer;bedside commode;ambulating Pt Will Perform Toileting - Clothing Manipulation and hygiene: with min guard assist;sitting/lateral leans;sit to/from stand Additional ADL Goal #1: Pt will increase bed mobility to Min A in order to transition to sittin on EOB with VC to maintain back precautions while performing self care task.  Plan Discharge plan remains appropriate;Frequency remains appropriate    Co-evaluation                 AM-PAC OT "6 Clicks" Daily Activity     Outcome Measure   Help from another person eating meals?: A Little Help from another person taking care of personal grooming?: A Little Help from another person toileting, which includes  using toliet, bedpan, or urinal?: A Lot Help from another person bathing (including washing, rinsing, drying)?: A Lot Help from another person to put on and taking off regular upper body clothing?: A Lot Help from another person to put on and taking off regular lower body clothing?: A Lot 6 Click Score: 14    End of Session Equipment Utilized During Treatment: Gait belt;Rolling walker (2 wheels);Back brace  OT Visit Diagnosis: Unsteadiness on feet (R26.81);Muscle weakness (generalized) (M62.81)   Activity Tolerance Patient tolerated treatment well   Patient Left in chair;with call bell/phone within reach;with chair alarm set   Nurse Communication Mobility status        Time: 2836-6294 OT Time Calculation (min): 33 min  Charges:    Lodema Hong, Stephen  Office [place] 10/13/2022, 2:48 PM

## 2022-10-13 NOTE — TOC Progression Note (Signed)
Transition of Care [facility]) - Progression Note    Patient Details  Name: [name] MRN: [id number] Date of Birth: 1955/12/24  Transition of Care [facility]) CM/SW [place], [place] Phone Number: 10/13/2022, 1:09 PM  Clinical Narrative:     Received message from  [facility]) SNF, they can admit patient tomorrow instead of today. [place] if family preferred SNF.   TOC will continue to follow and assist with discharge planning.  Thurmond Butts, MSW, LCSW Clinical Social Worker    Expected Discharge Plan: Skilled Nursing Facility Barriers to Discharge:  [facility] states they can admit tomorrw)  Expected Discharge Plan and Services In-house Referral: Clinical Social Work   Post Acute Care Choice: [place] Living arrangements for the past 2 months: [place] Expected Discharge Date: 10/13/22                                     Social Determinants of Health (SDOH) Interventions SDOH Screenings   Food Insecurity: No Food Insecurity (10/03/2022)  Housing: Low Risk  (10/03/2022)  Transportation Needs: No Transportation Needs (10/03/2022)  Utilities: Not At Risk (10/03/2022)  Depression (PHQ2-9): Low Risk  (01/27/2021)  Tobacco Use: Medium Risk (10/02/2022)    Readmission Risk Interventions     No data to display

## 2022-10-13 NOTE — Progress Notes (Signed)
Physical Therapy Treatment Patient Details Name: [name] MRN: [id number] DOB: [birth year] Today's Date: 10/13/2022   History of Present Illness 67 y.o. male presents to [facility] hospital on 10/02/2022 after fall. CT scan demonstrates new L1 compression fx. PMH: Chronic back pain, alcohol use, COPD, chronic hypoxic respiratory failure (on 2 L with CPAP and PRN), OSA on CPAP, HTN, CAD, macular degeneration, tracheostomy 07/2021.    PT Comments    Patient progressing slowly still limited by pain and anxiety.  HR up to 123 with ambulation in the room.  Family in the room and very supportive.  Report plans for transition to rehab today.  PT will follow up if not d/c.    Recommendations for follow up therapy are one component of a multi-disciplinary discharge planning process, led by the attending physician.  Recommendations may be updated based on patient status, additional functional criteria and insurance authorization.  Follow Up Recommendations  Skilled nursing-short term rehab (<3 hours/day) Can patient physically be transported by private vehicle: Yes   Assistance Recommended at Discharge Frequent or constant Supervision/Assistance  Patient can return home with the following Assistance with cooking/housework;Assist for transportation;Help with stairs or ramp for entrance;Direct supervision/assist for medications management;A lot of help with walking and/or transfers   Equipment Recommendations  Other (comment) (TBA)    Recommendations for Other Services       Precautions / Restrictions Precautions Precautions: Fall;Back Precaution Comments: repeating precautions randomly throughout session Required Braces or Orthoses: Spinal Brace Spinal Brace: Thoracolumbosacral orthotic;Applied in sitting position     Mobility  Bed Mobility Overal bed mobility: Needs Assistance Bed Mobility: Sit to Sidelying         Sit to sidelying: Min assist General bed mobility comments: cues for  techique and increased time    Transfers Overall transfer level: Needs assistance Equipment used: Rolling walker (2 wheels) Transfers: Sit to/from Stand Sit to Stand: Min assist           General transfer comment: cures for hand placement from recliner to RW and adjusted RW for height    Ambulation/Gait Ambulation/Gait assistance: Min Web designer (Feet): 40 Feet Assistive device: Rolling walker (2 wheels) Gait Pattern/deviations: Step-through pattern, Step-to pattern, Decreased stride length, Wide base of support       General Gait Details: heavy breathing throughout with c/o pain, but more due to pain, slow short suffling steps & shoulder elevation throughout   Stairs             Wheelchair Mobility    Modified Rankin (Stroke Patients Only)       Balance Overall balance assessment: Needs assistance Sitting-balance support: Feet supported, Single extremity supported, No upper extremity supported Sitting balance-Leahy Scale: Fair Sitting balance - Comments: can sit without UE support   Standing balance support: Bilateral upper extremity supported Standing balance-Leahy Scale: Poor Standing balance comment: reliant on BUE support and light min A for balance                            Cognition Arousal/Alertness: Awake/alert Behavior During Therapy: Anxious Overall Cognitive Status: Impaired/Different from baseline Area of Impairment: Attention, Safety/judgement, Memory, Problem solving                   Current Attention Level: Sustained Memory: Decreased short-term memory, Decreased recall of precautions   Safety/Judgement: Decreased awareness of safety   Problem Solving: Difficulty sequencing, Requires verbal cues  Exercises      General Comments General comments (skin integrity, edema, etc.): HR to 123 with ambulation in the room, SpO2 on RA 90 or greater      Pertinent Vitals/Pain Pain Assessment Pain  Assessment: Faces Faces Pain Scale: Hurts whole lot Pain Location: back with movement Pain Descriptors / Indicators: Discomfort, Grimacing, Moaning Pain Intervention(s): Monitored during session, Repositioned, Limited activity within patient's tolerance    Home Living                          Prior Function            PT Goals (current goals can now be found in the care plan section) Progress towards PT goals: Progressing toward goals    Frequency    Min 3X/week      PT Plan Current plan remains appropriate    Co-evaluation              AM-PAC PT "6 Clicks" Mobility   Outcome Measure  Help needed turning from your back to your side while in a flat bed without using bedrails?: A Little Help needed moving from lying on your back to sitting on the side of a flat bed without using bedrails?: A Little Help needed moving to and from a bed to a chair (including a wheelchair)?: A Little Help needed standing up from a chair using your arms (e.g., wheelchair or bedside chair)?: A Little Help needed to walk in hospital room?: A Little Help needed climbing 3-5 steps with a railing? : Total 6 Click Score: 16    End of Session Equipment Utilized During Treatment: Gait belt;Back brace Activity Tolerance: Patient limited by pain Patient left: in bed;with call bell/phone within reach;with family/visitor present   PT Visit Diagnosis: Unsteadiness on feet (R26.81);Difficulty in walking, not elsewhere classified (R26.2);Pain;Muscle weakness (generalized) (M62.81);Dizziness and giddiness (R42) Pain - Right/Left:  (back) Pain - part of body:  (back)     Time: 4536-4680 PT Time Calculation (min) (ACUTE ONLY): 36 min  Charges:  $Gait Training: 8-22 mins $Therapeutic Activity: 8-22 mins                     Magda Kiel, PT Acute Rehabilitation Services [phone] 10/13/2022    Reginia Naas 10/13/2022, 12:53 PM

## 2022-10-14 ENCOUNTER — Telehealth: Payer: Self-pay | Admitting: Student

## 2022-10-14 DIAGNOSIS — S32010A Wedge compression fracture of first lumbar vertebra, initial encounter for closed fracture: Secondary | ICD-10-CM | POA: Diagnosis not present

## 2022-10-14 DIAGNOSIS — J9611 Chronic respiratory failure with hypoxia: Secondary | ICD-10-CM | POA: Diagnosis not present

## 2022-10-14 DIAGNOSIS — F1090 Alcohol use, unspecified, uncomplicated: Secondary | ICD-10-CM | POA: Diagnosis not present

## 2022-10-14 DIAGNOSIS — S32018A Other fracture of first lumbar vertebra, initial encounter for closed fracture: Secondary | ICD-10-CM

## 2022-10-14 LAB — BASIC METABOLIC PANEL
Anion gap: 8 (ref 5–15)
BUN: 7 mg/dL — ABNORMAL LOW (ref 8–23)
CO2: 21 mmol/L — ABNORMAL LOW (ref 22–32)
Calcium: 9 mg/dL (ref 8.9–10.3)
Chloride: 106 mmol/L (ref 98–111)
Creatinine, Ser: 0.68 mg/dL (ref 0.61–1.24)
GFR, Estimated: >60 mL/min (ref >60)
Glucose, Bld: 104 mg/dL — ABNORMAL HIGH (ref 70–99)
Potassium: 3.7 mmol/L (ref 3.5–5.1)
Sodium: 135 mmol/L (ref 135–145)

## 2022-10-14 LAB — CBC WITH DIFFERENTIAL/PLATELET
Abs Immature Granulocytes: 0.07 10*3/uL (ref 0.00–0.07)
Basophils Absolute: 0.1 10*3/uL (ref 0.0–0.1)
Basophils Relative: 1 %
Eosinophils Absolute: 0.1 10*3/uL (ref 0.0–0.5)
Eosinophils Relative: 2 %
HCT: 38 % — ABNORMAL LOW (ref 39.0–52.0)
Hemoglobin: 13.2 g/dL (ref 13.0–17.0)
Immature Granulocytes: 1 %
Lymphocytes Relative: 17 %
Lymphs Abs: 1.1 10*3/uL (ref 0.7–4.0)
MCH: 33.7 pg (ref 26.0–34.0)
MCHC: 34.7 g/dL (ref 30.0–36.0)
MCV: 96.9 fL (ref 80.0–100.0)
Monocytes Absolute: 0.7 10*3/uL (ref 0.1–1.0)
Monocytes Relative: 11 %
Neutro Abs: 4.5 10*3/uL (ref 1.7–7.7)
Neutrophils Relative %: 68 %
Platelets: 245 10*3/uL (ref 150–400)
RBC: 3.92 MIL/uL — ABNORMAL LOW (ref 4.22–5.81)
RDW: 12.1 % (ref 11.5–15.5)
WBC: 6.6 10*3/uL (ref 4.0–10.5)
nRBC: 0 % (ref 0.0–0.2)

## 2022-10-14 LAB — MAGNESIUM: Magnesium: 2 mg/dL (ref 1.7–2.4)

## 2022-10-14 MED ORDER — OXYCODONE HCL 5 MG PO TABS: 5.0000 mg | ORAL_TABLET | ORAL | 0 refills | Status: DC | PRN

## 2022-10-14 MED ORDER — POTASSIUM CHLORIDE CRYS ER 20 MEQ PO TBCR
40.0000 meq | EXTENDED_RELEASE_TABLET | Freq: Once | ORAL | Status: AC
  Administered 2022-10-14: 40 meq via ORAL
  Filled 2022-10-14: qty 2

## 2022-10-14 NOTE — Telephone Encounter (Signed)
Closed compression fracture of L-1 fracture. Continued back pain. Plan to refill oxycodone 5 mg q4 hours PRN. Patient was not receiving Daliresp during hospitalization, will continue to hold at this time.

## 2022-10-14 NOTE — Discharge Summary (Signed)
Physician Discharge Summary   Patient: [name] MRN: [id number] DOB: 1956-03-17  Admit date:     10/02/2022  Discharge date: 10/14/22  Discharge Physician: Marguerita Merlesmair Jezabel Lecker, DO    PCP: Alease MedinaZiglar, Susan K, MD   Recommendations at discharge:   Follow up with PCP within 1-2 weeks and repeat CBC,CMP, Mag, Phos within 1 week Follow up with Neurosurgery within 1-2 weeks  Discharge Diagnoses: Principal Problem:   Closed compression fracture of body of L1 vertebra (HCC) Active Problems:   L1 vertebral fracture (HCC)   Alcohol use disorder   Chronic respiratory failure with hypoxia (HCC)   Acute urinary retention   Hyperlipidemia   Essential hypertension   OSA on CPAP   Acute on chronic respiratory failure with hypoxia (HCC)   COPD with chronic bronchitis and emphysema (HCC)   Alcohol withdrawal (HCC)   Encephalopathy acute   Alcohol abuse  Resolved Problems:   * No resolved hospital problems. [facility]*  Hospital Course: [name] is a 67 yo male with PMH chronic alcohol use, chronic back pain, COPD, chronic hypoxic respiratory failure (on 2 L with CPAP and PRN), OSA on CPAP, HTN, CAD, macular degeneration.  He presented after tripping over oxygen tubing at home and was brought in due to worsening back pain. He underwent imaging of T and L-spine which showed acute L1 compression fracture.  He also has known chronic thoracic compression fractures, chronic pubic rami fractures, right iliac bone fractures, and multiple old right rib fractures.  Case was discussed with neurosurgery and he was recommended for TLSO bracing for comfort when out of bed and outpatient follow-up. He began experiencing alcohol withdrawal soon after admission and was placed on CIWA protocol.    **Interim History  He is improving and getting close back to his baseline. PT/OT recommending SNF and he is medically stable to D/C given that he is out of withdrawals. His Labs are stable and he feels well and will need to follow  up with PCP within 1-2 weeks and Neurosurgery within 1-2 weeks.   ADDENDUM 10/14/22: Patient was medically stable to D/C to SNF as of 10/13/22 but SNF could not admit unitl today. No acute changes overnight and patient remains medically stable to D/C with outpatient follow up with the specialists as above  Assessment and Plan:  Acute L1 compression fracture -Due to mechanical fall at home (tripped over O2 tubing); patient not felt to be intoxicated upon initial evaluation by response teams -My colleague discussed with neurosurgery, recommended for TLSO brace when out of bed for comfort -Outpatient follow-up with neurosurgery -Continue working with PT/OT -Pre[facility] and [facility] requested to call back Thursday or Friday -Dilaudid dose lowered in setting of the rapid responses -If mentation remains stable and no further significant withdrawal anticipating discharge in next 24 to 48 hours -Continue with oxycodone 5 mg every 4 as needed severe pain as well as methocarbamol 5 mg p.o. every 6 as needed for muscle spasms as well as IV hydromorphone 0.5 mg every 4 as needed for severe pain -Also continuing lidocaine 1 patch transdermally every 12 hours -He has a bowel regimen with bisacodyl 10 mg rectal daily as needed, lactulose 20 g p.o. twice daily as needed as well as MiraLAX 17 g p.o. daily as needed for moderate constipation and Senna-Docusate 1 tab po BID    Alcohol Use Disorder Alcohol Withdrawal -Per daughter, patient drinks possibly up  to 20 beers per day and patient admits to drinking for over 50 years; patient does underreport usage -Continue CIWA protocol; scores finally downtrending; hopefully he is towards the end of his withdrawal with 1 to 4 mg p.o./IV every as needed for withdrawal symptoms -Continue thiamine 100 mg p.o. daily, folic acid 1 mg p.o. daily as well as multivitamin with minerals 1 tab p.o. daily    Acute Urinary Retention, improved  -Suspect due to overall deconditioning and worsening back pain with L1 compression fracture.  -Had ongoing retention with bladder scans -He had Foley catheter but will do a trial of void now and do bladder scans q8h; overnight he had a bladder scan of 390 cc with mild abdominal distention and he had a INO cath improvement.  This morning he voided 600 mL and this afternoon his post void residual was 242 mL today in the afternoon.  He is voiding well but if he starts feeling to void properly will need outpatient follow-up with urology   COPD Acute on chronic respiratory failure with hypoxia -Uses 2 to 3 L oxygen at night normally at home -Suspect multifactorial but some volume s/p resuscitation on admission and atelectasis from being bedbound for several days since admission -S/p steroid dose and lasix dose on 1/20 -Redosed with lasix on 1/21 during rapid response -Continue theophylline 400 mg p.o. daily -Negative PCT, negative BNP, no overt wheezing or volume overload;  -CXR stating "vascular congestion" on 1/21 -SpO2: 94 % O2 Flow Rate (L/min): 2 L/min FiO2 (%): 40 %; Currently not on O2 but states he wears 3 Liters all the time  -Echo reassuring: EF 60 to 65%, no RWMA, indeterminate diastolic parameters -Diuresed well with Lasix dose given on 10/09/2022 -Would benefit from ongoing PT and OOB as well as incentive spirometry -Continuous pulse oximetry maintain O2 saturation greater 90% -Continue supplemental oxygen via nasal cannula wean O2 to home regimen -Will need an amatory home O2 screen as well as repeat chest x-ray prior to discharge   Thrombocytopenia, improved  -Suspect bone marrow suppression due to chronic alcohol use -Platelet Count Trend: Recent Labs  Lab 10/08/22 0340 10/09/22 0356 10/10/22 0053 10/11/22 0303 10/12/22 0322 10/13/22 0201 10/14/22 0201  PLT 161 199 197 220 256 254 245  -Continue to Monitor for S/Sx of Bleeding; No  overt bleeding noted -Repeat CBC in the AM    Essential Hypertension -Elevated secondary to pain, continue pain control -Continue Lisinopril 10 mg po Daily and Metoprolol Succinate 25 mg po Daily -C/w Hydralazine 25 mg po q4hprn for SBP >160 or DBP >100 and Labetalol 10 mg IV q4hprn SBP >160 or DBP >100 -Continue to Monitor BP per Protocol -Last BP reading was elevated at 150/102 on last check   Hyperlipidemia -Continue Atorvastatin 40 mg po Daily    Hyponatremia, improved  -Patient's Na+ Trend Recent Labs  Lab 10/08/22 0340 10/09/22 0356 10/10/22 0053 10/11/22 0303 10/12/22 0322 10/13/22 0201 10/14/22 0201  NA 135 135 136 133* 136 133* 135  -Continue to Monitor and Trend and Repeat CMP in the AM    Hypoalbuminemia -Patient's Albumin Level Trend: Recent Labs  Lab 10/05/22 0411 10/06/22 0557 10/07/22 0411 10/08/22 0340 10/09/22 0356 10/10/22 0053 10/13/22 0201  ALBUMIN 3.8 3.5 3.4* 3.4* 3.9 3.6 3.2*  -Continue to Monitor and Trend and repeat CMP in the AM    GERD/GI Prophylaxis -C/w Pantoprazole 40 mg po Daily  Metabolic Acidosis -Mild. CO2 is now 21, AG is now 8, Chloride is now  106 -Continue to Monitor and Trend and repeat CMP within 1 week    Hypokalemia -K+ Trend: Recent Labs  Lab 10/08/22 0340 10/09/22 0356 10/10/22 0053 10/11/22 0303 10/12/22 0322 10/13/22 0201 10/14/22 0201  K 3.6 3.5 3.2* 3.2* 3.2* 3.6 3.7  -Replete with po Kcl 40 mEQ x1 -Continue to Monitor and Replete as Necessary -Repeat CMP in the the AM    Normocytic Anemia -Hgb/Hct Trend: Recent Labs  Lab 10/08/22 0340 10/09/22 0356 10/10/22 0053 10/11/22 0303 10/12/22 0322 10/13/22 0201 10/14/22 0201  HGB 12.4* 14.2 13.9 13.3 13.4 12.7* 13.2  HCT 34.4* 40.7 39.7 37.5* 37.7* 35.9* 38.0*  MCV 96.6 100.0 98.3 97.7 96.2 96.2 96.9  -Improved. And will need to Continue to Monitor for S/Sx of Active Bleeding; no overt bleeding noted -Repeat CBC in the AM   Consultants: PCCM  Transfer Procedures performed: As delineated as above  Disposition: Skilled nursing facility Diet recommendation:  Cardiac diet DISCHARGE MEDICATION: Allergies as of 10/14/2022   No Known Allergies      Medication List     STOP taking these medications    hydrochlorothiazide 12.5 MG capsule Commonly known as: MICROZIDE   predniSONE 10 MG tablet Commonly known as: DELTASONE       TAKE these medications    albuterol (2.5 MG/3ML) 0.083% nebulizer solution Commonly known as: PROVENTIL Take 3 mLs (2.5 mg total) by nebulization every 6 (six) hours as needed for wheezing or shortness of breath. What changed: Another medication with the same name was changed. Make sure you understand how and when to take each.   Ventolin HFA 108 (90 Base) MCG/ACT inhaler Generic drug: albuterol Inhale 2 puffs every 4-6 hours- rescue What changed:  how much to take how to take this when to take this reasons to take this   aspirin EC 81 MG tablet Take 81 mg by mouth daily.   atorvastatin 40 MG tablet Commonly known as: LIPITOR TAKE 1 TABLET BY MOUTH ONCE DAILY   cyclobenzaprine 5 MG tablet Commonly known as: FLEXERIL Take 5 mg by mouth 3 (three) times daily as needed for muscle spasms.   fluocinonide 0.05 % external solution Commonly known as: LIDEX Apply 1 Application topically 2 (two) times daily as needed (skin irritation).   folic acid 1 MG tablet Commonly known as: FOLVITE Take 1 tablet (1 mg total) by mouth daily.   ibuprofen 200 MG tablet Commonly known as: ADVIL Take 400-600 mg by mouth every 6 (six) hours as needed for mild pain.   ketoconazole 2 % cream Commonly known as: NIZORAL Apply 1 Application topically 2 (two) times daily as needed for irritation.   ketoconazole 2 % shampoo Commonly known as: NIZORAL Apply 1 Application topically 2 (two) times a week.   lidocaine 5 % Commonly known as: LIDODERM Place 1 patch onto the skin at bedtime. Remove & Discard  patch within 12 hours or as directed by MD   lisinopril 10 MG tablet Commonly known as: ZESTRIL Take 1 tablet (10 mg total) by mouth daily. What changed:  medication strength how much to take   metoprolol succinate 25 MG 24 hr tablet Commonly known as: TOPROL-XL Take 25 mg by mouth daily.   MULTI FOR HIM 50+ PO Take 1 tablet by mouth daily.   multivitamin with minerals Tabs tablet Take 1 tablet by mouth daily.   OXYGEN Inhale 3 L into the lungs at bedtime.   pantoprazole sodium 40 mg Commonly known as: PROTONIX Take 40 mg by  mouth daily.   polyethylene glycol 17 g packet Commonly known as: MIRALAX / GLYCOLAX Take 17 g by mouth daily as needed for moderate constipation.   roflumilast 500 MCG Tabs tablet Commonly known as: DALIRESP Take 0.5 tablets (250 mcg total) by mouth daily for 28 days, THEN 1 tablet (500 mcg total) daily. Start taking on: July 15, 2022 Notes to patient: Follow up with Dr. Maple Hudson as to whether he wants you to continue taking this medcation   senna-docusate 8.6-50 MG tablet Commonly known as: Senokot-S Take 1 tablet by mouth at bedtime.   tamsulosin 0.4 MG Caps capsule Commonly known as: FLOMAX Take 0.4 mg by mouth daily.   theophylline 400 MG 24 hr capsule Commonly known as: THEO-24 Take 200 mg by mouth 2 (two) times daily. What changed: Another medication with the same name was removed. Continue taking this medication, and follow the directions you see here.   thiamine 100 MG tablet Commonly known as: Vitamin B-1 Take 1 tablet (100 mg total) by mouth daily.   Trelegy Ellipta 100-62.5-25 MCG/ACT Aepb Generic drug: Fluticasone-Umeclidin-Vilant INHALE 1 PUFF INTO THE LUNGS EVERY DAY AT THE SAME TIME EACH DAY What changed: See the new instructions.   vitamin D3 25 MCG tablet Commonly known as: CHOLECALCIFEROL Place 2 tablets (2,000 Units total) into feeding tube daily. What changed: how to take this       Discharge Exam: Filed  Weights   10/12/22 0545 10/13/22 0500 10/14/22 0514  Weight: 99.5 kg 97 kg 100.2 kg   Vitals:   10/14/22 0023 10/14/22 0520  BP: (!) 147/93 (!) 150/102  Pulse: 83 84  Resp: 15 16  Temp: 98.9 F (37.2 C) 98.3 F (36.8 C)  SpO2: 94% 94%   Examination: Physical Exam:  Constitutional: WN/WD overweight Caucasian male in NAD  Respiratory: Diminished to auscultation bilaterally, no wheezing, rales, rhonchi or crackles. Normal respiratory effort and patient is not tachypenic. No accessory muscle use.  Cardiovascular: RRR, no murmurs / rubs / gallops. S1 and S2 auscultated. No extremity edema. 2+ pedal pulses. No carotid bruits.  Abdomen: Soft, non-tender, Distended 2/2 body habitus. Bowel sounds positive.  GU: Deferred. Musculoskeletal: No clubbing / cyanosis of digits/nails. No joint deformity upper and lower extremities.  Skin: No rashes, lesions, ulcers on a limited skin evaluation. No induration; Warm and dry.  Neurologic: CN 2-12 grossly intact with no focal deficits but has slight tremors. Romberg sign and cerebellar reflexes not assessed.  Psychiatric: Normal judgment and insight. Alert and oriented x 3. Normal mood and appropriate affect.   Condition at discharge: stable  The results of significant diagnostics from this hospitalization (including imaging, microbiology, ancillary and laboratory) are listed below for reference.   Imaging Studies: ECHOCARDIOGRAM COMPLETE  Result Date: 10/09/2022    ECHOCARDIOGRAM REPORT   Patient Name:   DENYS SALINGER Brewer Date of Exam: 10/09/2022 Medical Rec #:  [id number]        Height:       74.0 in Accession #:    [id number]       Weight:       230.0 lb Date of Birth:  07/03/1956       BSA:          2.306 m Patient Age:    66 years         BP:           117/83 mmHg Patient Gender: M  HR:           115 bpm. Exam Location:  Inpatient Procedure: 2D Echo, Cardiac Doppler, Color Doppler and Intracardiac            Opacification Agent  Indications:    CHF-acute diastolic  History:        Patient has no prior history of Echocardiogram examinations.                 COPD; Risk Factors:Sleep Apnea, Hypertension and Former Smoker.                 ETOH abuse.  Sonographer:    Ross LudwigArthur Guy RDCS (AE) Referring Phys: Hillary BowJARED M GARDNER  Sonographer Comments: Technically challenging study due to limited acoustic windows, Technically difficult study due to poor echo windows, suboptimal parasternal window, suboptimal apical window and suboptimal subcostal window. Image acquisition challenging due to COPD and Image acquisition challenging due to respiratory motion. IMPRESSIONS  1. Technically difficult study. Left ventricular ejection fraction, by estimation, is 60 to 65%. The left ventricle has normal function. The left ventricle has no regional wall motion abnormalities. Left ventricular diastolic parameters are indeterminate.  2. Right ventricular systolic function is normal. The right ventricular size is mildly enlarged.  3. The mitral valve is normal in structure. No evidence of mitral valve regurgitation.  4. The aortic valve was not well visualized. Aortic valve regurgitation is not visualized. No aortic stenosis is present.  5. The inferior vena cava is normal in size with greater than 50% respiratory variability, suggesting right atrial pressure of 3 mmHg. FINDINGS  Left Ventricle: Left ventricular ejection fraction, by estimation, is 60 to 65%. The left ventricle has normal function. The left ventricle has no regional wall motion abnormalities. Definity contrast agent was given IV to delineate the left ventricular  endocardial borders. The left ventricular internal cavity size was normal in size. There is no left ventricular hypertrophy. Left ventricular diastolic parameters are indeterminate. Right Ventricle: The right ventricular size is mildly enlarged. Right vetricular wall thickness was not well visualized. Right ventricular systolic function is  normal. Left Atrium: Left atrial size was normal in size. Right Atrium: Right atrial size was normal in size. Pericardium: There is no evidence of pericardial effusion. Mitral Valve: The mitral valve is normal in structure. No evidence of mitral valve regurgitation. Tricuspid Valve: The tricuspid valve is normal in structure. Tricuspid valve regurgitation is not demonstrated. Aortic Valve: The aortic valve was not well visualized. Aortic valve regurgitation is not visualized. No aortic stenosis is present. Aortic valve mean gradient measures 4.0 mmHg. Aortic valve peak gradient measures 7.2 mmHg. Aortic valve area, by VTI measures 3.62 cm. Pulmonic Valve: The pulmonic valve was not well visualized. Pulmonic valve regurgitation is not visualized. Aorta: The aortic root is normal in size and structure. Venous: The inferior vena cava is normal in size with greater than 50% respiratory variability, suggesting right atrial pressure of 3 mmHg. IAS/Shunts: The interatrial septum was not well visualized.  LEFT VENTRICLE PLAX 2D LVIDd:         4.80 cm LVIDs:         4.10 cm LV PW:         1.00 cm LV IVS:        1.10 cm LVOT diam:     2.50 cm LV SV:         69 LV SV Index:   30 LVOT Area:     4.91 cm  RIGHT VENTRICLE  RV Basal diam:  4.30 cm RV Mid diam:    3.90 cm RV S prime:     17.50 cm/s TAPSE (M-mode): 2.4 cm LEFT ATRIUM             Index        RIGHT ATRIUM           Index LA diam:        2.40 cm 1.04 cm/m   RA Area:     14.60 cm LA Vol (A2C):   46.4 ml 20.12 ml/m  RA Volume:   35.20 ml  15.26 ml/m LA Vol (A4C):   27.5 ml 11.92 ml/m LA Biplane Vol: 37.3 ml 16.17 ml/m  AORTIC VALVE AV Area (Vmax):    3.32 cm AV Area (Vmean):   3.04 cm AV Area (VTI):     3.62 cm AV Vmax:           134.00 cm/s AV Vmean:          95.400 cm/s AV VTI:            0.191 m AV Peak Grad:      7.2 mmHg AV Mean Grad:      4.0 mmHg LVOT Vmax:         90.70 cm/s LVOT Vmean:        59.100 cm/s LVOT VTI:          0.141 m LVOT/AV VTI ratio:  0.74  AORTA Ao Root diam: 3.90 cm  SHUNTS Systemic VTI:  0.14 m Systemic Diam: 2.50 cm Oswaldo Milian MD Electronically signed by Oswaldo Milian MD Signature Date/Time: 10/09/2022/2:35:54 PM    Final    DG Chest Port 1 View  Result Date: 10/09/2022 CLINICAL DATA:  Acute respiratory distress. Evaluate for pleural effusion or edema. EXAM: PORTABLE CHEST 1 VIEW COMPARISON:  10/08/2022 FINDINGS: Enteric tube tip is below the level of the GE junction. Heart size is normal. Pulmonary vascular congestion, but no signs of pleural effusion or frank interstitial edema. No airspace opacities. Previous left shoulder arthroplasty. Remote right posterior rib deformities. IMPRESSION: Pulmonary vascular congestion. Electronically Signed   By: Kerby Moors M.D.   On: 10/09/2022 06:41   DG CHEST PORT 1 VIEW  Result Date: 10/08/2022 CLINICAL DATA:  Shortness of breath. EXAM: PORTABLE CHEST 1 VIEW COMPARISON:  CT angio chest 10/02/2022 FINDINGS: Heart size appears normal. Increased pulmonary vascularity identified. No pleural effusion or edema. No airspace opacities identified. Multiple remote right posterior and lateral rib deformities are identified as seen on CT from 10/02/2022. Status post left shoulder arthroplasty. IMPRESSION: 1. No acute cardiopulmonary abnormalities. 2. Increased pulmonary vascularity. Electronically Signed   By: Kerby Moors M.D.   On: 10/08/2022 07:52   CT L-SPINE NO CHARGE  Result Date: 10/02/2022 CLINICAL DATA:  Recent fall with L1 compression fracture on CTA of the abdomen and pelvis EXAM: CT THORACIC SPINE WITHOUT CONTRAST; CT LUMBAR SPINE WITHOUT CONTRAST TECHNIQUE: Multidetector CT images of the thoracic and lumbar spine. Were obtained using the standard protocol without intravenous contrast. RADIATION DOSE REDUCTION: This exam was performed according to the departmental dose-optimization program which includes automated exposure control, adjustment of the mA and/or kV  according to patient size and/or use of iterative reconstruction technique. COMPARISON:  02/02/2022 FINDINGS: CT of the thoracic spine: Alignment: Normal sick kyphosis is noted. Vertebrae: 12 thoracic vertebra are noted. Chronic compression fractures are noted at T1, T3 and T2 for. These are stable from prior CT from May of 2023.  Multilevel osteophytic changes are noted. No other fractures are noted. Multiple right-sided rib fractures are noted with varying degrees of healing similar to that seen on recent CTA of the chest. Paraspinal and other soft tissues: Paraspinal soft tissues are within normal limits. No focal hematoma is noted. Disc levels: No significant acute disc pathology is noted CT of lumbar spine: Alignment: Normal lordosis is noted. Vertebrae: 5 lumbar type vertebral bodies are well visualized. Vertebral body height is well maintained with the exception of an acute L1 compression fracture with approximately 30% height loss identified. No other compression fractures are seen. Changes of prior screw fixation of the sacroiliac joints are seen. Prior right iliac bone fracture adjacent to the SI joint is seen with callus formation consistent with healing. Paraspinal and other soft tissues: Paraspinal soft tissues are within normal limits. Disc levels: No specific disc pathology is noted. IMPRESSION: CT of the thoracic spine chronic compression deformities as described. Multiple old right rib fractures are seen. CT of the lumbar spine: Acute L1 compression fracture with 30% height loss identified. Changes of prior SI joint fixation and the right iliac bone fracture with healing are seen. Electronically Signed   By: Alcide Clever M.D.   On: 10/02/2022 21:31   CT T-SPINE NO CHARGE  Result Date: 10/02/2022 CLINICAL DATA:  Recent fall with L1 compression fracture on CTA of the abdomen and pelvis EXAM: CT THORACIC SPINE WITHOUT CONTRAST; CT LUMBAR SPINE WITHOUT CONTRAST TECHNIQUE: Multidetector CT images of  the thoracic and lumbar spine. Were obtained using the standard protocol without intravenous contrast. RADIATION DOSE REDUCTION: This exam was performed according to the departmental dose-optimization program which includes automated exposure control, adjustment of the mA and/or kV according to patient size and/or use of iterative reconstruction technique. COMPARISON:  02/02/2022 FINDINGS: CT of the thoracic spine: Alignment: Normal sick kyphosis is noted. Vertebrae: 12 thoracic vertebra are noted. Chronic compression fractures are noted at T1, T3 and T2 for. These are stable from prior CT from May of 2023. Multilevel osteophytic changes are noted. No other fractures are noted. Multiple right-sided rib fractures are noted with varying degrees of healing similar to that seen on recent CTA of the chest. Paraspinal and other soft tissues: Paraspinal soft tissues are within normal limits. No focal hematoma is noted. Disc levels: No significant acute disc pathology is noted CT of lumbar spine: Alignment: Normal lordosis is noted. Vertebrae: 5 lumbar type vertebral bodies are well visualized. Vertebral body height is well maintained with the exception of an acute L1 compression fracture with approximately 30% height loss identified. No other compression fractures are seen. Changes of prior screw fixation of the sacroiliac joints are seen. Prior right iliac bone fracture adjacent to the SI joint is seen with callus formation consistent with healing. Paraspinal and other soft tissues: Paraspinal soft tissues are within normal limits. Disc levels: No specific disc pathology is noted. IMPRESSION: CT of the thoracic spine chronic compression deformities as described. Multiple old right rib fractures are seen. CT of the lumbar spine: Acute L1 compression fracture with 30% height loss identified. Changes of prior SI joint fixation and the right iliac bone fracture with healing are seen. Electronically Signed   By: Alcide Clever  M.D.   On: 10/02/2022 21:31   CT Angio Chest/Abd/Pel for Dissection W and/or Wo Contrast  Result Date: 10/02/2022 CLINICAL DATA:  Recent trip and fall with back pain, initial encounter EXAM: CT ANGIOGRAPHY CHEST, ABDOMEN AND PELVIS TECHNIQUE: Non-contrast CT of the chest  was initially obtained. Multidetector CT imaging through the chest, abdomen and pelvis was performed using the standard protocol during bolus administration of intravenous contrast. Multiplanar reconstructed images and MIPs were obtained and reviewed to evaluate the vascular anatomy. RADIATION DOSE REDUCTION: This exam was performed according to the departmental dose-optimization program which includes automated exposure control, adjustment of the mA and/or kV according to patient size and/or use of iterative reconstruction technique. CONTRAST:  OMNIPAQUE IOHEXOL 350 MG/ML [facility] COMPARISON:  08/12/2022 FINDINGS: CTA CHEST FINDINGS Cardiovascular: Initial noncontrast images demonstrate no significant atherosclerotic calcifications of the thoracic aorta. Post-contrast images demonstrate a normal branching pattern of the thoracic aorta. No evidence of aneurysmal dilatation or dissection is seen. No cardiac enlargement is noted. Pulmonary artery as visualized is within normal limits. No significant coronary calcifications are seen. Mediastinum/Nodes: Thoracic inlet is within normal limits. No hilar or mediastinal adenopathy is noted. The esophagus is within normal limits. Lungs/Pleura: Lungs are well aerated bilaterally. Emphysematous changes are seen. No focal infiltrate or sizable effusion is seen. No pneumothorax is noted. Musculoskeletal: Multiple old rib fractures with healing are noted on the right posteriorly. No acute rib fracture is seen. Prior shoulder replacement on the left is noted. Multiple chronic appearing compression deformities are noted involving T1, T3 and T4. Additionally, an acute appearing L1 fracture is noted with  proximally 30% vertebral body height loss. Review of the MIP images confirms the above findings. CTA ABDOMEN AND PELVIS FINDINGS VASCULAR Aorta: Atherosclerotic calcifications are noted without aneurysmal dilatation or dissection. Celiac: Patent without evidence of aneurysm, dissection, vasculitis or significant stenosis. SMA: Patent without evidence of aneurysm, dissection, vasculitis or significant stenosis. Renals: Single renal artery is noted on right with dual renal arteries on the left. Mild atherosclerotic changes are noted. No focal stenoses are seen. IMA: Patent without evidence of aneurysm, dissection, vasculitis or significant stenosis. Inflow: Iliacs show atherosclerotic calcification without focal abnormality. Veins: No specific venous abnormality is seen. Review of the MIP images confirms the above findings. NON-VASCULAR Hepatobiliary: No focal liver abnormality is seen. No gallstones, gallbladder wall thickening, or biliary dilatation. Pancreas: Unremarkable. No pancreatic ductal dilatation or surrounding inflammatory changes. Spleen: Normal in size without focal abnormality. Adrenals/Urinary Tract: Adrenal glands are within normal limits. Kidneys show a normal enhancement pattern bilaterally. Cysts are noted bilaterally the largest of which seen in the upper pole of the right kidney measuring 4.3 cm. These are simple in nature and no further follow-up is recommended. No renal calculi or obstructive changes are seen. The bladder is well distended. Stomach/Bowel: Scattered diverticular change of the colon is noted. No evidence of diverticulitis is seen. The appendix is air-filled and within normal limits. Small bowel and stomach are unremarkable. Lymphatic: No sizable adenopathy is noted. Reproductive: Prostate is unremarkable. Other: No abdominal wall hernia or abnormality. No abdominopelvic ascites. Musculoskeletal: Postsurgical changes are noted in the right hip. Prior pubic rami fractures with  healing are noted on the right. 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 These changes are stable from the prior study. Prior screw fixation of the sacroiliac joints is seen. Fracture involving the right iliac bone adjacent to the sacroiliac joint is again seen with some callus formation consistent with. The known L1 compression fracture is seen. Again approximately 30% height loss is noted. Review of the MIP images confirms the above findings. IMPRESSION: CTA of the chest: No evidence of aortic dissection or pulmonary emboli. Multiple old rib fractures are identified on the right. Additionally multiple chronic  appearing compression deformities are noted throughout the thoracic spine as described. These are stable from prior CT from May of 2023. CTA of the abdomen and pelvis: No arterial abnormality is noted. Acute L1 compression fracture. 30% height loss is noted. Diverticulosis without diverticulitis. Chronic pubic rami fractures and right iliac bone fractures as described Electronically Signed   By: Alcide Clever M.D.   On: 10/02/2022 21:24    Microbiology: Results for orders placed or performed during the hospital encounter of 08/30/21  Culture, Respiratory w Gram Stain     Status: None   Collection Time: 09/09/21  3:45 PM   Specimen: Tracheal Aspirate; Respiratory  Result Value Ref Range Status   Specimen Description TRACHEAL ASPIRATE  Final   Special Requests NONE  Final   Gram Stain   Final    FEW SQUAMOUS EPITHELIAL CELLS PRESENT MODERATE WBC PRESENT, PREDOMINANTLY PMN MODERATE GRAM NEGATIVE COCCOBACILLI FEW GRAM POSITIVE COCCI RARE GRAM POSITIVE RODS    Culture   Final    MODERATE Normal respiratory flora-no Staph aureus or Pseudomonas seen Performed at [facility], [address], [place], [place] [address]    Report Status 09/12/2021 FINAL  Final  Urine Culture     Status: Abnormal   Collection Time: 09/11/21  6:55 PM   Specimen: Urine, Clean Catch  Result  Value Ref Range Status   Specimen Description URINE, CLEAN CATCH  Final   Special Requests   Final    NONE Performed at [facility], [address] [place], [place] [address]    Culture >=100,000 COLONIES/mL ENTEROCOCCUS FAECALIS (A)  Final   Report Status 09/13/2021 FINAL  Final   Organism ID, Bacteria ENTEROCOCCUS FAECALIS (A)  Final      Susceptibility   Enterococcus faecalis - MIC*    AMPICILLIN <=2 SENSITIVE Sensitive     NITROFURANTOIN <=16 SENSITIVE Sensitive     VANCOMYCIN 1 SENSITIVE Sensitive     * >=100,000 COLONIES/mL ENTEROCOCCUS FAECALIS  Culture, body fluid w Gram Stain-bottle     Status: None   Collection Time: 09/21/21 11:26 AM   Specimen: Pleura  Result Value Ref Range Status   Specimen Description PLEURAL  Final   Special Requests NONE  Final   Gram Stain   Final    MODERATE WBC PRESENT, PREDOMINANTLY MONONUCLEAR NO ORGANISMS SEEN    Culture   Final    NO GROWTH 5 DAYS Performed at [facility], [address] [place], [place] [address]    Report Status 09/26/2021 FINAL  Final   Labs: CBC: Recent Labs  Lab 10/10/22 0053 10/11/22 0303 10/12/22 0322 10/13/22 0201 10/14/22 0201  WBC 8.9 8.9 7.6 6.8 6.6  NEUTROABS 6.7 6.4 5.7 4.7 4.5  HGB 13.9 13.3 13.4 12.7* 13.2  HCT 39.7 37.5* 37.7* 35.9* 38.0*  MCV 98.3 97.7 96.2 96.2 96.9  PLT 197 220 256 254 245    Basic Metabolic Panel: Recent Labs  Lab 10/10/22 0053 10/11/22 0303 10/12/22 0322 10/13/22 0201 10/14/22 0201  NA 136 133* 136 133* 135  K 3.2* 3.2* 3.2* 3.6 3.7  CL 104 102 106 104 106  CO2 25 21* 23 22 21*  GLUCOSE 96 125* 110* 108* 104*  BUN 24* 7*  CREATININE 0.74 0.75 0.70 0.69 0.68  CALCIUM 8.9 8.7* 8.9 8.6* 9.0  MG 2.2 1.8 1.8 2.0 2.0  PHOS  --   --   --  2.8  --  Liver Function Tests: Recent Labs  Lab 10/08/22 0340 10/09/22 0356 10/10/22 0053 10/13/22 0201  AST 29 37 29 32  ALT 29 38 39 36  ALKPHOS 88 98 96 98  BILITOT 0.9 0.9 0.9 0.5  PROT  6.2* 7.4 7.0 6.5  ALBUMIN 3.4* 3.9 3.6 3.2*    CBG: No results for input(s): "GLUCAP" in the last 168 hours.  Discharge time spent: greater than 30 minutes.  Signed: Raiford Noble, DO [facility] 10/14/2022

## 2022-10-14 NOTE — Progress Notes (Signed)
Attempted to call report to number provided.  Call went to vm, left message with call back number.

## 2022-10-14 NOTE — TOC Transition Note (Signed)
Transition of Care [facility]) - CM/SW Discharge Note   Patient Details  Name: [name] MRN: [id number] Date of Birth: [birth year]  [facility] CM/SW Contact:  Vinie Sill, LCSW Phone Number: 10/14/2022, 12:52 PM   Clinical Narrative:     Patient will Discharge to: [facility]  Discharge Date: 10/14/2022 Family Notified: daughter Transport By: Corey Harold  Per MD patient is ready for discharge. RN, patient, and facility notified of discharge. Discharge Summary sent to facility. RN given number for [phone]. Ambulance transport requested for patient.   Clinical Social Worker signing off.  Thurmond Butts, MSW, LCSW Clinical Social Worker     Final next level of care: Skilled Nursing Facility Barriers to Discharge: Barriers Resolved   Patient Goals and CMS Choice CMS Medicare.gov Compare Post Acute Care list provided to:: Patient Represenative (must comment) (daughter [name]) Choice offered to / list presented to : Adult Children (daughter [name])  Discharge Placement                Patient chooses bed at:  [facility]) Patient to be transferred to facility by: [place] Name of family member notified: daughter Patient and family notified of of transfer: 10/14/22  Discharge Plan and Services Additional resources added to the After Visit Summary for   In-house Referral: Clinical Social Work   Post Acute Care Choice: [place]                               Social Determinants of Health (SDOH) Interventions SDOH Screenings   Food Insecurity: No Food Insecurity (10/03/2022)  Housing: Low Risk  (10/03/2022)  Transportation Needs: No Transportation Needs (10/03/2022)  Utilities: Not At Risk (10/03/2022)  Depression (PHQ2-9): Low Risk  (01/27/2021)  Tobacco Use: Medium Risk (10/02/2022)     Readmission Risk Interventions     No data to display

## 2022-10-14 NOTE — Care Management Important Message (Signed)
Important Message  Patient Details  Name: [name] MRN: [id number] Date of Birth: 1956/04/14   Medicare Important Message Given:  Yes     Shelda Altes 10/14/2022, 8:57 AM

## 2022-10-17 ENCOUNTER — Encounter: Payer: Self-pay | Admitting: Student

## 2022-10-17 ENCOUNTER — Non-Acute Institutional Stay (SKILLED_NURSING_FACILITY): Payer: 59 | Admitting: Student

## 2022-10-17 DIAGNOSIS — H353211 Exudative age-related macular degeneration, right eye, with active choroidal neovascularization: Secondary | ICD-10-CM | POA: Diagnosis not present

## 2022-10-17 DIAGNOSIS — J4489 Other specified chronic obstructive pulmonary disease: Secondary | ICD-10-CM | POA: Diagnosis not present

## 2022-10-17 DIAGNOSIS — G4733 Obstructive sleep apnea (adult) (pediatric): Secondary | ICD-10-CM | POA: Diagnosis not present

## 2022-10-17 DIAGNOSIS — F1090 Alcohol use, unspecified, uncomplicated: Secondary | ICD-10-CM

## 2022-10-17 DIAGNOSIS — J439 Emphysema, unspecified: Secondary | ICD-10-CM

## 2022-10-17 DIAGNOSIS — S32010A Wedge compression fracture of first lumbar vertebra, initial encounter for closed fracture: Secondary | ICD-10-CM

## 2022-10-17 DIAGNOSIS — I10 Essential (primary) hypertension: Secondary | ICD-10-CM

## 2022-10-17 DIAGNOSIS — H353221 Exudative age-related macular degeneration, left eye, with active choroidal neovascularization: Secondary | ICD-10-CM

## 2022-10-17 NOTE — Progress Notes (Signed)
Provider:  Dr. Earnestine Mealing Location:  Other [place].  Nursing Home Room Number: [facility] 105A Place of Service:  SNF ([id number])  PCP: [name], MD Patient Care Team: Ziglar, Eli Phillips, MD as PCP - General (Family Medicine) Charles Budge, MD as Consulting Physician (Pulmonary Disease) Luciana Axe Alford Highland, MD as Consulting Physician (Ophthalmology)  Extended Emergency Contact Information Primary Emergency Contact: Brewer,Charles Mobile Phone: [phone] Relation: Daughter Secondary Emergency Contact: [name] States of [place] Work Phone: [phone] Relation: Significant other  Code Status: Full Code Goals of Care: Advanced Directive information    10/17/2022    9:42 AM  Advanced Directives  Does Patient Have a Medical Advance Directive? No  Would patient like information on creating a medical advance directive? No - Patient declined      Chief Complaint  Patient presents with   New Admit To SNF    Admission.     HPI: Patient is a 67 y.o. male seen today for admission to [facility]  He fell trying to get out of bed to the bathroom and tripped over the cord to his breathing machine. Fell to the closet. He was down 12-14 hours and his girlfirend found him. He didn't remmber any of it. He got a lot of medication for pain control. He doesn't remember most of the hospitalization.   Today he just finished exercise with physical therapy. He was having some trouble getting the back brace on. He was able to walk all the way to the exercise room this mornning and got on the machine. He did 60-70 of those and walked back to the room. His daughter is supposed to come at 23.   He has been eating okay and drinking okay. He has urinated fine. He has been taking pain medicaiton that has made him constipated. His pain has been controlled with the medication. He has been able to have bowel movments with the use of   HE quit smoking 15 years ago for 30 years he  smoked.   We talked about treatment for alcohol use. Interested in quitting. His partner quit 3 years ago. He is interested in no longer drinking alcohol when he gets home. He thinks that contribute dot the fall and his confusion in the hospital.   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 He has two children.   Past Medical History:  Diagnosis Date   COPD (chronic obstructive pulmonary disease) (HCC)    COPD (chronic obstructive pulmonary disease) (HCC)    GERD (gastroesophageal reflux disease)    History of hepatitis C    treated several years ago through [facility]   Hyperlipidemia    Hypertension    OSA (obstructive sleep apnea)    Past Surgical History:  Procedure Laterality Date   CATARACT EXTRACTION, BILATERAL     EYE SURGERY Bilateral    IR THORACENTESIS ASP PLEURAL SPACE W/IMG GUIDE  09/14/2021   IR THORACENTESIS ASP PLEURAL SPACE W/IMG GUIDE  09/21/2021   ORIF PELVIC FRACTURE WITH PERCUTANEOUS SCREWS Right 07/29/2021   Procedure: SI SCREW FIXATION OF RIGHT PELVIC RING;  Surgeon: Myrene Galas, MD;  Location: [facility] OR;  Service: Orthopedics;  Laterality: Right;   ORTHOPEDIC SURGERY Bilateral    pt states fracture repairs to arms and legs.   REVERSE SHOULDER ARTHROPLASTY Left 09/23/2019   Procedure: REVERSE SHOULDER ARTHROPLASTY;  Surgeon: Jones Broom, MD;  Location: [facility] ORS;  Service: Orthopedics;  Laterality: Left;   TRACHEOSTOMY  TUBE PLACEMENT N/A 08/10/2021   Procedure: TRACHEOSTOMY;  Surgeon: Violeta Gelinas, MD;  Location: [facility] OR;  Service: General;  Laterality: N/A;    reports that he quit smoking about 12 years ago. His smoking use included cigarettes. He started smoking about 52 years ago. He has a 35.00 pack-year smoking history. He has never used smokeless tobacco. He reports current alcohol use of about 42.0 standard drinks of alcohol per week. He reports that he does not use drugs. Social History   Socioeconomic History   Marital status: Single     Spouse name: Not on file   Number of children: 2   Years of education: Not on file   Highest education level: Not on file  Occupational History   Occupation: Holiday representative  Tobacco Use   Smoking status: Former    Packs/day: 1.00    Years: 35.00    Total pack years: 35.00    Types: Cigarettes    Start date: 89    Quit date: 10/01/2010    Years since quitting: 12.0   Smokeless tobacco: Never  Vaping Use   Vaping Use: Never used  Substance and Sexual Activity   Alcohol use: Yes    Alcohol/week: 42.0 standard drinks of alcohol    Types: 42 Cans of beer per week    Comment: daily - 6 drinks a day   Drug use: Never    Comment: last marijuana 2-3 weeks ago   Sexual activity: Not Currently  Other Topics Concern   Not on file  Social History Narrative   Not on file   Social Determinants of Health   Financial Resource Strain: Not on file  Food Insecurity: No Food Insecurity (10/03/2022)   Hunger Vital Sign    Worried About Running Out of Food in the Last Year: Never true    Ran Out of Food in the Last Year: Never true  Transportation Needs: No Transportation Needs (10/03/2022)   PRAPARE - Administrator, Civil Service (Medical): No    Lack of Transportation (Non-Medical): No  Physical Activity: Not on file  Stress: Not on file  Social Connections: Not on file  Intimate Partner Violence: Not At Risk (10/03/2022)   Humiliation, Afraid, Rape, and Kick questionnaire    Fear of Current or Ex-Partner: No    Emotionally Abused: No    Physically Abused: No    Sexually Abused: No    Functional Status Survey:    Family History  Problem Relation Age of Onset   Heart disease Mother    Hypertension Father     Health Maintenance  Topic Date Due   Medicare Annual Wellness (AWV)  Never done   Zoster Vaccines- Shingrix (1 of 2) Never done   COVID-19 Vaccine (5 - 2023-24 season) 05/20/2022   Pneumonia Vaccine 81+ Years old (3 - PPSV23 or PCV20) 01/11/2023   Lung  Cancer Screening  10/03/2023   COLONOSCOPY (Pts 45-54yrs Insurance coverage will need to be confirmed)  09/20/2027   DTaP/Tdap/Td (2 - Td or Tdap) 08/08/2029   INFLUENZA VACCINE  Completed   Hepatitis C Screening  Completed   HPV VACCINES  Aged Out    No Known Allergies  Outpatient Encounter Medications as of 10/17/2022  Medication Sig   albuterol (PROVENTIL) (2.5 MG/3ML) 0.083% nebulizer solution Take 3 mLs (2.5 mg total) by nebulization every 6 (six) hours as needed for wheezing or shortness of breath.   albuterol (VENTOLIN HFA) 108 (90 Base) MCG/ACT inhaler Inhale 2 puffs  into the lungs every 4 (four) hours as needed for wheezing or shortness of breath.   aspirin EC 81 MG tablet Take 81 mg by mouth daily.   atorvastatin (LIPITOR) 40 MG tablet TAKE 1 TABLET BY MOUTH ONCE DAILY   Cholecalciferol (VITAMIN D3) 25 MCG (1000 UT) CAPS Give 2 by mouth once daily.   cyclobenzaprine (FLEXERIL) 5 MG tablet Take 5 mg by mouth 3 (three) times daily as needed for muscle spasms.   fluocinonide (LIDEX) 0.05 % external solution Apply 1 Application topically 2 (two) times daily as needed (skin irritation).   Fluticasone-Umeclidin-Vilant (TRELEGY ELLIPTA) 100-62.5-25 MCG/ACT AEPB INHALE 1 PUFF INTO THE LUNGS EVERY DAY AT THE SAME TIME EACH DAY   folic acid (FOLVITE) 1 MG tablet Take 1 tablet (1 mg total) by mouth daily.   ibuprofen (ADVIL) 200 MG tablet Take 600 mg by mouth every 6 (six) hours as needed for mild pain.   ketoconazole (NIZORAL) 2 % cream Apply 1 Application topically 2 (two) times daily as needed for irritation.   ketoconazole (NIZORAL) 2 % shampoo Apply 1 Application topically 2 (two) times a week.   lidocaine (LIDODERM) 5 % Place 1 patch onto the skin at bedtime. Remove & Discard patch within 12 hours or as directed by MD   lisinopril (ZESTRIL) 10 MG tablet Take 1 tablet (10 mg total) by mouth daily.   metoprolol succinate (TOPROL-XL) 25 MG 24 hr tablet Take 25 mg by mouth daily.    Multiple Vitamin (MULTIVITAMIN WITH MINERALS) TABS tablet Take 1 tablet by mouth daily.   Multiple Vitamins-Minerals (MULTI FOR HIM 50+ PO) Take 1 tablet by mouth daily.   oxyCODONE (ROXICODONE) 5 MG immediate release tablet Take 1 tablet (5 mg total) by mouth every 4 (four) hours as needed for up to 7 days for severe pain.   OXYGEN Inhale 3 L into the lungs at bedtime.   pantoprazole sodium (PROTONIX) 40 mg Take 40 mg by mouth daily.   polyethylene glycol (MIRALAX / GLYCOLAX) 17 g packet Take 17 g by mouth daily as needed for moderate constipation.   senna-docusate (SENOKOT-S) 8.6-50 MG tablet Take 1 tablet by mouth at bedtime.   tamsulosin (FLOMAX) 0.4 MG CAPS capsule Take 0.4 mg by mouth daily.   theophylline (THEO-24) 400 MG 24 hr capsule Take 200 mg by mouth 2 (two) times daily.   thiamine (VITAMIN B-1) 100 MG tablet Take 1 tablet (100 mg total) by mouth daily.   [DISCONTINUED] cholecalciferol (VITAMIN D) 25 MCG tablet Place 2 tablets (2,000 Units total) into feeding tube daily. (Patient taking differently: Take 2,000 Units by mouth daily.)   [DISCONTINUED] roflumilast (DALIRESP) 500 MCG TABS tablet Take 0.5 tablets (250 mcg total) by mouth daily for 28 days, THEN 1 tablet (500 mcg total) daily. (Patient not taking: Reported on 10/04/2022)   [DISCONTINUED] VENTOLIN HFA 108 (90 Base) MCG/ACT inhaler Inhale 2 puffs every 4-6 hours- rescue (Patient taking differently: Inhale 2 puffs every 4 hours- rescue)   No facility-administered encounter medications on file as of 10/17/2022.    Review of Systems  Vitals:   10/17/22 0927  BP: 129/84  Pulse: 90  Resp: 18  Temp: 98.1 F (36.7 C)  SpO2: 91%  Weight: 224 lb (101.6 kg)  Height: 6\' 2"  (1.88 m)   Body mass index is 28.76 kg/m. Physical Exam Constitutional:      Appearance: Normal appearance.  Cardiovascular:     Rate and Rhythm: Normal rate.     Pulses: Normal  pulses.  Pulmonary:     Effort: Pulmonary effort is normal.     Breath  sounds: No wheezing.  Abdominal:     Palpations: Abdomen is soft.  Skin:    General: Skin is warm and dry.  Neurological:     General: No focal deficit present.     Mental Status: He is alert and oriented to person, place, and time.  Psychiatric:        Mood and Affect: Mood normal.        Behavior: Behavior normal.        Thought Content: Thought content normal.        Judgment: Judgment normal.     Labs reviewed: Basic Metabolic Panel: Recent Labs    10/12/22 0322 10/13/22 0201 10/14/22 0201  NA 136 133* 135  K 3.2* 3.6 3.7  CL 106 104 106  CO2 23 22 21*  GLUCOSE 110* 108* 104*  BUN 13 9 7*  CREATININE 0.70 0.69 0.68  CALCIUM 8.9 8.6* 9.0  MG 1.8 2.0 2.0  PHOS  --  2.8  --    Liver Function Tests: Recent Labs    10/09/22 0356 10/10/22 0053 10/13/22 0201  AST 37 29 32  ALT 38 39 36  ALKPHOS 98 96 98  BILITOT 0.9 0.9 0.5  PROT 7.4 7.0 6.5  ALBUMIN 3.9 3.6 3.2*   No results for input(s): "LIPASE", "AMYLASE" in the last 8760 hours. Recent Labs    10/09/22 0703  AMMONIA 25   CBC: Recent Labs    10/12/22 0322 10/13/22 0201 10/14/22 0201  WBC 7.6 6.8 6.6  NEUTROABS 5.7 4.7 4.5  HGB 13.4 12.7* 13.2  HCT 37.7* 35.9* 38.0*  MCV 96.2 96.2 96.9  PLT 256 254 245   Cardiac Enzymes: Recent Labs    10/02/22 1807  CKTOTAL 171   BNP: Invalid input(s): "POCBNP" Lab Results  Component Value Date   HGBA1C 5.4 07/28/2021   Lab Results  Component Value Date   TSH 10.767 (H) 08/04/2021   Lab Results  Component Value Date   VITAMINB12 610 08/25/2021   Lab Results  Component Value Date   FOLATE 21.3 08/25/2021   No results found for: "IRON", "TIBC", "FERRITIN"  Imaging and Procedures obtained prior to SNF admission: CT L-SPINE NO CHARGE  Result Date: 10/02/2022 CLINICAL DATA:  Recent fall with L1 compression fracture on CTA of the abdomen and pelvis EXAM: CT THORACIC SPINE WITHOUT CONTRAST; CT LUMBAR SPINE WITHOUT CONTRAST TECHNIQUE:  Multidetector CT images of the thoracic and lumbar spine. Were obtained using the standard protocol without intravenous contrast. RADIATION DOSE REDUCTION: This exam was performed according to the departmental dose-optimization program which includes automated exposure control, adjustment of the mA and/or kV according to patient size and/or use of iterative reconstruction technique. COMPARISON:  02/02/2022 FINDINGS: CT of the thoracic spine: Alignment: Normal sick kyphosis is noted. Vertebrae: 12 thoracic vertebra are noted. Chronic compression fractures are noted at T1, T3 and T2 for. These are stable from prior CT from May of 2023. Multilevel osteophytic changes are noted. No other fractures are noted. Multiple right-sided rib fractures are noted with varying degrees of healing similar to that seen on recent CTA of the chest. Paraspinal and other soft tissues: Paraspinal soft tissues are within normal limits. No focal hematoma is noted. Disc levels: No significant acute disc pathology is noted CT of lumbar spine: Alignment: Normal lordosis is noted. Vertebrae: 5 lumbar type vertebral bodies are well visualized. Vertebral body height  is well maintained with the exception of an acute L1 compression fracture with approximately 30% height loss identified. No other compression fractures are seen. Changes of prior screw fixation of the sacroiliac joints are seen. Prior right iliac bone fracture adjacent to the SI joint is seen with callus formation consistent with healing. Paraspinal and other soft tissues: Paraspinal soft tissues are within normal limits. Disc levels: No specific disc pathology is noted. IMPRESSION: CT of the thoracic spine chronic compression deformities as described. Multiple old right rib fractures are seen. CT of the lumbar spine: Acute L1 compression fracture with 30% height loss identified. Changes of prior SI joint fixation and the right iliac bone fracture with healing are seen. Electronically  Signed   By: Alcide Clever M.D.   On: 10/02/2022 21:31   CT T-SPINE NO CHARGE  Result Date: 10/02/2022 CLINICAL DATA:  Recent fall with L1 compression fracture on CTA of the abdomen and pelvis EXAM: CT THORACIC SPINE WITHOUT CONTRAST; CT LUMBAR SPINE WITHOUT CONTRAST TECHNIQUE: Multidetector CT images of the thoracic and lumbar spine. Were obtained using the standard protocol without intravenous contrast. RADIATION DOSE REDUCTION: This exam was performed according to the departmental dose-optimization program which includes automated exposure control, adjustment of the mA and/or kV according to patient size and/or use of iterative reconstruction technique. COMPARISON:  02/02/2022 FINDINGS: CT of the thoracic spine: Alignment: Normal sick kyphosis is noted. Vertebrae: 12 thoracic vertebra are noted. Chronic compression fractures are noted at T1, T3 and T2 for. These are stable from prior CT from May of 2023. Multilevel osteophytic changes are noted. No other fractures are noted. Multiple right-sided rib fractures are noted with varying degrees of healing similar to that seen on recent CTA of the chest. Paraspinal and other soft tissues: Paraspinal soft tissues are within normal limits. No focal hematoma is noted. Disc levels: No significant acute disc pathology is noted CT of lumbar spine: Alignment: Normal lordosis is noted. Vertebrae: 5 lumbar type vertebral bodies are well visualized. Vertebral body height is well maintained with the exception of an acute L1 compression fracture with approximately 30% height loss identified. No other compression fractures are seen. Changes of prior screw fixation of the sacroiliac joints are seen. Prior right iliac bone fracture adjacent to the SI joint is seen with callus formation consistent with healing. Paraspinal and other soft tissues: Paraspinal soft tissues are within normal limits. Disc levels: No specific disc pathology is noted. IMPRESSION: CT of the thoracic spine  chronic compression deformities as described. Multiple old right rib fractures are seen. CT of the lumbar spine: Acute L1 compression fracture with 30% height loss identified. Changes of prior SI joint fixation and the right iliac bone fracture with healing are seen. Electronically Signed   By: Alcide Clever M.D.   On: 10/02/2022 21:31   CT Angio Chest/Abd/Pel for Dissection W and/or Wo Contrast  Result Date: 10/02/2022 CLINICAL DATA:  Recent trip and fall with back pain, initial encounter EXAM: CT ANGIOGRAPHY CHEST, ABDOMEN AND PELVIS TECHNIQUE: Non-contrast CT of the chest was initially obtained. Multidetector CT imaging through the chest, abdomen and pelvis was performed using the standard protocol during bolus administration of intravenous contrast. Multiplanar reconstructed images and MIPs were obtained and reviewed to evaluate the vascular anatomy. RADIATION DOSE REDUCTION: This exam was performed according to the departmental dose-optimization program which includes automated exposure control, adjustment of the mA and/or kV according to patient size and/or use of iterative reconstruction technique. CONTRAST:  OMNIPAQUE IOHEXOL 350  MG/ML SOLN COMPARISON:  08/12/2022 FINDINGS: CTA CHEST FINDINGS Cardiovascular: Initial noncontrast images demonstrate no significant atherosclerotic calcifications of the thoracic aorta. Post-contrast images demonstrate a normal branching pattern of the thoracic aorta. No evidence of aneurysmal dilatation or dissection is seen. No cardiac enlargement is noted. Pulmonary artery as visualized is within normal limits. No significant coronary calcifications are seen. Mediastinum/Nodes: Thoracic inlet is within normal limits. No hilar or mediastinal adenopathy is noted. The esophagus is within normal limits. Lungs/Pleura: Lungs are well aerated bilaterally. Emphysematous changes are seen. No focal infiltrate or sizable effusion is seen. No pneumothorax is noted.  Musculoskeletal: Multiple old rib fractures with healing are noted on the right posteriorly. No acute rib fracture is seen. Prior shoulder replacement on the left is noted. Multiple chronic appearing compression deformities are noted involving T1, T3 and T4. Additionally, an acute appearing L1 fracture is noted with proximally 30% vertebral body height loss. Review of the MIP images confirms the above findings. CTA ABDOMEN AND PELVIS FINDINGS VASCULAR Aorta: Atherosclerotic calcifications are noted without aneurysmal dilatation or dissection. Celiac: Patent without evidence of aneurysm, dissection, vasculitis or significant stenosis. SMA: Patent without evidence of aneurysm, dissection, vasculitis or significant stenosis. Renals: Single renal artery is noted on right with dual renal arteries on the left. Mild atherosclerotic changes are noted. No focal stenoses are seen. IMA: Patent without evidence of aneurysm, dissection, vasculitis or significant stenosis. Inflow: Iliacs show atherosclerotic calcification without focal abnormality. Veins: No specific venous abnormality is seen. Review of the MIP images confirms the above findings. NON-VASCULAR Hepatobiliary: No focal liver abnormality is seen. No gallstones, gallbladder wall thickening, or biliary dilatation. Pancreas: Unremarkable. No pancreatic ductal dilatation or surrounding inflammatory changes. Spleen: Normal in size without focal abnormality. Adrenals/Urinary Tract: Adrenal glands are within normal limits. Kidneys show a normal enhancement pattern bilaterally. Cysts are noted bilaterally the largest of which seen in the upper pole of the right kidney measuring 4.3 cm. These are simple in nature and no further follow-up is recommended. No renal calculi or obstructive changes are seen. The bladder is well distended. Stomach/Bowel: Scattered diverticular change of the colon is noted. No evidence of diverticulitis is seen. The appendix is air-filled and within  normal limits. Small bowel and stomach are unremarkable. Lymphatic: No sizable adenopathy is noted. Reproductive: Prostate is unremarkable. Other: No abdominal wall hernia or abnormality. No abdominopelvic ascites. Musculoskeletal: Postsurgical changes are noted in the right hip. Prior pubic rami fractures with healing are noted on the right. 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 These changes are stable from the prior study. Prior screw fixation of the sacroiliac joints is seen. Fracture involving the right iliac bone adjacent to the sacroiliac joint is again seen with some callus formation consistent with. The known L1 compression fracture is seen. Again approximately 30% height loss is noted. Review of the MIP images confirms the above findings. IMPRESSION: CTA of the chest: No evidence of aortic dissection or pulmonary emboli. Multiple old rib fractures are identified on the right. Additionally multiple chronic appearing compression deformities are noted throughout the thoracic spine as described. These are stable from prior CT from May of 2023. CTA of the abdomen and pelvis: No arterial abnormality is noted. Acute L1 compression fracture. 30% height loss is noted. Diverticulosis without diverticulitis. Chronic pubic rami fractures and right iliac bone fractures as described Electronically Signed   By: Alcide Clever M.D.   On: 10/02/2022 21:24    Assessment/Plan 1. Closed compression fracture of  body of L1 vertebra (HCC) 10/02/2022 Patient sustained an unwitnessed fall with back fracture. Down for 12 hours. Since improving. Hospitlaizaiton complicated by alcohol withdrawal and he is outside of the acute phase at this time. Alert and orientedx4. Plan to continue oxycodone 5 mg q4 hr prn for back pain. Multimodal pain control with lidoderm patch, tylenol as well. D/c Ibuprofen given alcohol use and risk of ulcer/bleed.   2. COPD with chronic bronchitis and emphysema (HCC) Breathing well at  this time. On home regimen. Wears 3LNC at night to sleep. Continue Theophylline 200 mg BID. Continue  3. OSA on CPAP CPAP here from home. Continue nightly use  4. Exudative age-related macular degeneration of right eye with active choroidal neovascularization (HCC) 5. Exudative age-related macular degeneration of left eye with active choroidal neovascularization [facility]) Patient receives treatment q7 weeks. He is due and will plan to go to appointment with support of TL transport  6. Essential hypertension Bp well-controlled at this time. Continue lisinopril 10 mg. Continue atorvastatin.   7. Alcohol use disorder Discussed at length patient's current alcohol use and concern for long term impact given this incident. Patient states he would like to stop drinking since he has not had a drink since the fall on 10/02/2022. He has support of his family. Discussed the use of Naltrexone in the future when he no longer has acute pain and on narcotic pain medication for treatment. He would like to discuss further with his PCP at discharge. Continue Folic Acid and Thiamine supplementation    Family/ staff Communication: Daughter updated  Labs/tests ordered: CBC, BMP

## 2022-10-20 ENCOUNTER — Non-Acute Institutional Stay (SKILLED_NURSING_FACILITY): Payer: 59 | Admitting: Nurse Practitioner

## 2022-10-20 ENCOUNTER — Encounter: Payer: Self-pay | Admitting: Nurse Practitioner

## 2022-10-20 DIAGNOSIS — S32018D Other fracture of first lumbar vertebra, subsequent encounter for fracture with routine healing: Secondary | ICD-10-CM

## 2022-10-20 DIAGNOSIS — K5903 Drug induced constipation: Secondary | ICD-10-CM | POA: Diagnosis not present

## 2022-10-20 MED ORDER — OXYCODONE HCL 5 MG PO TABS: 5.0000 mg | ORAL_TABLET | ORAL | 0 refills | Status: DC | PRN

## 2022-10-20 NOTE — Progress Notes (Signed)
Location:  [facility] Room Number: 105A Place of Service:  SNF (31)  Ziglar, Eli Phillips, MD  Patient Care Team: Ziglar, Eli Phillips, MD as PCP - General (Family Medicine) Waymon Budge, MD as Consulting Physician (Pulmonary Disease) Luciana Axe Alford Highland, MD as Consulting Physician (Ophthalmology)  Extended Emergency Contact Information Primary Emergency Contact: Murray,Jade Mobile Phone: [phone] Relation: Daughter Secondary Emergency Contact: [name] [place] Work Phone: [phone] Relation: Significant other  Goals of care: Advanced Directive information    10/17/2022    9:42 AM  Advanced Directives  Does Patient Have a Medical Advance Directive? No  Would patient like information on creating a medical advance directive? No - Patient declined     Chief Complaint  Patient presents with   Acute Visit    Uncontrolled pain.    HPI:  Pt is a 67 y.o. male seen today for acute visit. Pt reports pain got worse a few days after he left the hospital. He feels like pain is so intense he is not able to do his workouts. He is having pain in back and sides. Movement like extending his arms is very painful. He has been sitting and laying in the bed a lot which he knows is not helping.  Reports pain level is at a 9, he just took an oxycodone reports this will bring pain to a 5 or 6 and that is taking oxycodone with muscle relaxer He is not having any numbness or tingling.  Having a lot sharp stabbing pain- feels like there is an ice pick in his back.    Past Medical History:  Diagnosis Date   COPD (chronic obstructive pulmonary disease) (HCC)    COPD (chronic obstructive pulmonary disease) (HCC)    GERD (gastroesophageal reflux disease)    History of hepatitis C    treated several years ago through [facility]   Hyperlipidemia    Hypertension    OSA (obstructive sleep apnea)    Past Surgical History:  Procedure Laterality Date   CATARACT EXTRACTION,  BILATERAL     EYE SURGERY Bilateral    IR THORACENTESIS ASP PLEURAL SPACE W/IMG GUIDE  09/14/2021   IR THORACENTESIS ASP PLEURAL SPACE W/IMG GUIDE  09/21/2021   ORIF PELVIC FRACTURE WITH PERCUTANEOUS SCREWS Right 07/29/2021   Procedure: SI SCREW FIXATION OF RIGHT PELVIC RING;  Surgeon: Myrene Galas, MD;  Location: [facility] OR;  Service: Orthopedics;  Laterality: Right;   ORTHOPEDIC SURGERY Bilateral    pt states fracture repairs to arms and legs.   REVERSE SHOULDER ARTHROPLASTY Left 09/23/2019   Procedure: REVERSE SHOULDER ARTHROPLASTY;  Surgeon: Jones Broom, MD;  Location: [facility] ORS;  Service: Orthopedics;  Laterality: Left;   TRACHEOSTOMY TUBE PLACEMENT N/A 08/10/2021   Procedure: TRACHEOSTOMY;  Surgeon: Violeta Gelinas, MD;  Location: [facility] OR;  Service: General;  Laterality: N/A;    No Known Allergies  Outpatient Encounter Medications as of 10/20/2022  Medication Sig   albuterol (VENTOLIN HFA) 108 (90 Base) MCG/ACT inhaler Inhale 2 puffs into the lungs every 4 (four) hours as needed for wheezing or shortness of breath.   aspirin EC 81 MG tablet Take 81 mg by mouth daily.   atorvastatin (LIPITOR) 40 MG tablet TAKE 1 TABLET BY MOUTH ONCE DAILY   Cholecalciferol (VITAMIN D3) 25 MCG (1000 UT) CAPS Give 2 by mouth once daily.   cyclobenzaprine (FLEXERIL) 5 MG tablet Take 5 mg by mouth 3 (three) times daily as needed for muscle spasms.   fluocinonide (LIDEX)  0.05 % external solution Apply 1 Application topically 2 (two) times daily as needed (skin irritation).   Fluticasone-Umeclidin-Vilant (TRELEGY ELLIPTA) 100-62.5-25 MCG/ACT AEPB INHALE 1 PUFF INTO THE LUNGS EVERY DAY AT THE SAME TIME EACH DAY   folic acid (FOLVITE) 1 MG tablet Take 1 tablet (1 mg total) by mouth daily.   ketoconazole (NIZORAL) 2 % cream Apply 1 Application topically 2 (two) times daily as needed for irritation.   ketoconazole (NIZORAL) 2 % shampoo Apply 1 Application topically 2 (two) times a week.   lidocaine (LIDODERM) 5 %  Place 1 patch onto the skin at bedtime. Remove & Discard patch within 12 hours or as directed by MD   lisinopril (ZESTRIL) 10 MG tablet Take 1 tablet (10 mg total) by mouth daily.   metoprolol succinate (TOPROL-XL) 25 MG 24 hr tablet Take 25 mg by mouth daily.   Multiple Vitamins-Minerals (MULTI FOR HIM 50+ PO) Take 1 tablet by mouth daily.   oxyCODONE (ROXICODONE) 5 MG immediate release tablet Take 1-2 tablets (5-10 mg total) by mouth every 4 (four) hours as needed for severe pain or moderate pain. (Patient taking differently: Take 5 mg by mouth every 4 (four) hours as needed for severe pain or moderate pain.)   OXYGEN Inhale 3 L into the lungs at bedtime.   pantoprazole sodium (PROTONIX) 40 mg Take 40 mg by mouth daily.   polyethylene glycol (MIRALAX / GLYCOLAX) 17 g packet Take 17 g by mouth daily as needed for moderate constipation.   roflumilast (DALIRESP) 500 MCG TABS tablet Take 500 mcg by mouth daily.   senna-docusate (SENOKOT-S) 8.6-50 MG tablet Take 1 tablet by mouth at bedtime.   tamsulosin (FLOMAX) 0.4 MG CAPS capsule Take 0.4 mg by mouth daily.   theophylline (THEO-24) 200 MG 24 hr capsule Take 200 mg by mouth 2 (two) times daily.   thiamine (VITAMIN B-1) 100 MG tablet Take 1 tablet (100 mg total) by mouth daily.   [DISCONTINUED] Multiple Vitamin (MULTIVITAMIN WITH MINERALS) TABS tablet Take 1 tablet by mouth daily.   [DISCONTINUED] ibuprofen (ADVIL) 200 MG tablet Take 600 mg by mouth every 6 (six) hours as needed for mild pain.   [DISCONTINUED] oxyCODONE (ROXICODONE) 5 MG immediate release tablet Take 1 tablet (5 mg total) by mouth every 4 (four) hours as needed for up to 7 days for severe pain.   No facility-administered encounter medications on file as of 10/20/2022.    Review of Systems  Constitutional:  Negative for activity change, appetite change, fatigue and unexpected weight change.  HENT:  Negative for congestion and hearing loss.   Eyes: Negative.   Respiratory:   Negative for cough and shortness of breath.        Copd at baseline  Cardiovascular:  Negative for chest pain, palpitations and leg swelling.  Gastrointestinal:  Positive for constipation. Negative for abdominal pain and diarrhea.  Genitourinary:  Negative for difficulty urinating and dysuria.  Musculoskeletal:  Positive for arthralgias, back pain and myalgias.  Skin:  Negative for color change and wound.  Neurological:  Negative for dizziness and weakness.  Psychiatric/Behavioral:  Negative for agitation, behavioral problems and confusion.      Immunization History  Administered Date(s) Administered   Influenza Split 06/19/2017   Influenza,inj,Quad PF,6+ Mos 10/01/2014, 07/27/2015, 08/20/2018, 06/21/2019, 06/21/2021   Influenza-Unspecified 06/19/2020, 07/18/2022   Moderna Sars-Covid-2 Vaccination 11/13/2019, 12/04/2019, 12/09/2019, 12/30/2019   PPD Test 08/19/2015   Pneumococcal Conjugate-13 10/01/2014   Pneumococcal Polysaccharide-23 01/10/2018   Tdap 08/09/2019  Pertinent  Health Maintenance Due  Topic Date Due   COLONOSCOPY (Pts 45-12yrs Insurance coverage will need to be confirmed)  09/20/2027   INFLUENZA VACCINE  Completed      08/29/2021    8:00 AM 08/29/2021    8:00 PM 08/30/2021    8:00 AM 10/02/2022    6:01 PM 10/03/2022    6:01 AM  Fall Risk  (RETIRED) Patient Fall Risk Level High fall risk High fall risk High fall risk High fall risk High fall risk   Functional Status Survey:    Vitals:   10/20/22 1342  BP: 134/86  Pulse: (!) 107  Resp: 18  Temp: (!) 97 F (36.1 C)  SpO2: 92%  Weight: 224 lb 12.8 oz (102 kg)  Height: 6\' 2"  (1.88 m)   Body mass index is 28.86 kg/m. Physical Exam Constitutional:      General: He is not in acute distress.    Appearance: He is well-developed. He is not diaphoretic.  HENT:     Head: Normocephalic and atraumatic.     Right Ear: External ear normal.     Left Ear: External ear normal.     Mouth/Throat:     Pharynx: No  oropharyngeal exudate.  Eyes:     Conjunctiva/sclera: Conjunctivae normal.     Pupils: Pupils are equal, round, and reactive to light.  Cardiovascular:     Rate and Rhythm: Normal rate and regular rhythm.     Heart sounds: Normal heart sounds.  Pulmonary:     Effort: Pulmonary effort is normal.     Breath sounds: Decreased breath sounds (throughout) present.  Abdominal:     General: Bowel sounds are normal.     Palpations: Abdomen is soft.  Musculoskeletal:     Cervical back: Normal range of motion and neck supple.     Thoracic back: Tenderness present. Decreased range of motion.     Right lower leg: No edema.     Left lower leg: No edema.     Comments: Guarded movement  Skin:    General: Skin is warm and dry.  Neurological:     Mental Status: He is alert and oriented to person, place, and time.     Labs reviewed: Recent Labs    10/12/22 0322 10/13/22 0201 10/14/22 0201  NA 136 133* 135  K 3.2* 3.6 3.7  CL 106 104 106  CO2 23 22 21*  GLUCOSE 110* 108* 104*  BUN 13 9 7*  CREATININE 0.70 0.69 0.68  CALCIUM 8.9 8.6* 9.0  MG 1.8 2.0 2.0  PHOS  --  2.8  --    Recent Labs    10/09/22 0356 10/10/22 0053 10/13/22 0201  AST 37 29 32  ALT 38 39 36  ALKPHOS 98 96 98  BILITOT 0.9 0.9 0.5  PROT 7.4 7.0 6.5  ALBUMIN 3.9 3.6 3.2*   Recent Labs    10/12/22 0322 10/13/22 0201 10/14/22 0201  WBC 7.6 6.8 6.6  NEUTROABS 5.7 4.7 4.5  HGB 13.4 12.7* 13.2  HCT 37.7* 35.9* 38.0*  MCV 96.2 96.2 96.9  PLT 256 254 245   Lab Results  Component Value Date   TSH 10.767 (H) 08/04/2021   Lab Results  Component Value Date   HGBA1C 5.4 07/28/2021   Lab Results  Component Value Date   CHOL 185 02/09/2022   HDL 61 02/09/2022   LDLCALC 85 02/09/2022   LDLDIRECT 106.0 01/29/2021   TRIG 235 (H) 02/09/2022   CHOLHDL 3.0  02/09/2022    Significant Diagnostic Results in last 30 days:  ECHOCARDIOGRAM COMPLETE  Result Date: 10/09/2022    ECHOCARDIOGRAM REPORT   Patient  Name:   PRUITT TABOADA III Date of Exam: 10/09/2022 Medical Rec #:  [id number]        Height:       74.0 in Accession #:    [id number]       Weight:       230.0 lb Date of Birth:  May 23, 1956       BSA:          2.306 m Patient Age:    74 years         BP:           117/83 mmHg Patient Gender: M                HR:           115 bpm. Exam Location:  Inpatient Procedure: 2D Echo, Cardiac Doppler, Color Doppler and Intracardiac            Opacification Agent Indications:    CHF-acute diastolic  History:        Patient has no prior history of Echocardiogram examinations.                 COPD; Risk Factors:Sleep Apnea, Hypertension and Former Smoker.                 ETOH abuse.  Sonographer:    Clayton Lefort RDCS (AE) Referring Phys: Etta Quill  Sonographer Comments: Technically challenging study due to limited acoustic windows, Technically difficult study due to poor echo windows, suboptimal parasternal window, suboptimal apical window and suboptimal subcostal window. Image acquisition challenging due to COPD and Image acquisition challenging due to respiratory motion. IMPRESSIONS  1. Technically difficult study. Left ventricular ejection fraction, by estimation, is 60 to 65%. The left ventricle has normal function. The left ventricle has no regional wall motion abnormalities. Left ventricular diastolic parameters are indeterminate.  2. Right ventricular systolic function is normal. The right ventricular size is mildly enlarged.  3. The mitral valve is normal in structure. No evidence of mitral valve regurgitation.  4. The aortic valve was not well visualized. Aortic valve regurgitation is not visualized. No aortic stenosis is present.  5. The inferior vena cava is normal in size with greater than 50% respiratory variability, suggesting right atrial pressure of 3 mmHg. FINDINGS  Left Ventricle: Left ventricular ejection fraction, by estimation, is 60 to 65%. The left ventricle has normal function. The left ventricle has  no regional wall motion abnormalities. Definity contrast agent was given IV to delineate the left ventricular  endocardial borders. The left ventricular internal cavity size was normal in size. There is no left ventricular hypertrophy. Left ventricular diastolic parameters are indeterminate. Right Ventricle: The right ventricular size is mildly enlarged. Right vetricular wall thickness was not well visualized. Right ventricular systolic function is normal. Left Atrium: Left atrial size was normal in size. Right Atrium: Right atrial size was normal in size. Pericardium: There is no evidence of pericardial effusion. Mitral Valve: The mitral valve is normal in structure. No evidence of mitral valve regurgitation. Tricuspid Valve: The tricuspid valve is normal in structure. Tricuspid valve regurgitation is not demonstrated. Aortic Valve: The aortic valve was not well visualized. Aortic valve regurgitation is not visualized. No aortic stenosis is present. Aortic valve mean gradient measures 4.0 mmHg. Aortic valve peak gradient measures 7.2 mmHg. Aortic  valve area, by VTI measures 3.62 cm. Pulmonic Valve: The pulmonic valve was not well visualized. Pulmonic valve regurgitation is not visualized. Aorta: The aortic root is normal in size and structure. Venous: The inferior vena cava is normal in size with greater than 50% respiratory variability, suggesting right atrial pressure of 3 mmHg. IAS/Shunts: The interatrial septum was not well visualized.  LEFT VENTRICLE PLAX 2D LVIDd:         4.80 cm LVIDs:         4.10 cm LV PW:         1.00 cm LV IVS:        1.10 cm LVOT diam:     2.50 cm LV SV:         69 LV SV Index:   30 LVOT Area:     4.91 cm  RIGHT VENTRICLE RV Basal diam:  4.30 cm RV Mid diam:    3.90 cm RV S prime:     17.50 cm/s TAPSE (M-mode): 2.4 cm LEFT ATRIUM             Index        RIGHT ATRIUM           Index LA diam:        2.40 cm 1.04 cm/m   RA Area:     14.60 cm LA Vol (A2C):   46.4 ml 20.12 ml/m  RA  Volume:   35.20 ml  15.26 ml/m LA Vol (A4C):   27.5 ml 11.92 ml/m LA Biplane Vol: 37.3 ml 16.17 ml/m  AORTIC VALVE AV Area (Vmax):    3.32 cm AV Area (Vmean):   3.04 cm AV Area (VTI):     3.62 cm AV Vmax:           134.00 cm/s AV Vmean:          95.400 cm/s AV VTI:            0.191 m AV Peak Grad:      7.2 mmHg AV Mean Grad:      4.0 mmHg LVOT Vmax:         90.70 cm/s LVOT Vmean:        59.100 cm/s LVOT VTI:          0.141 m LVOT/AV VTI ratio: 0.74  AORTA Ao Root diam: 3.90 cm  SHUNTS Systemic VTI:  0.14 m Systemic Diam: 2.50 cm Epifanio Lescheshristopher Schumann MD Electronically signed by Epifanio Lescheshristopher Schumann MD Signature Date/Time: 10/09/2022/2:35:54 PM    Final    DG Chest Port 1 View  Result Date: 10/09/2022 CLINICAL DATA:  Acute respiratory distress. Evaluate for pleural effusion or edema. EXAM: PORTABLE CHEST 1 VIEW COMPARISON:  10/08/2022 FINDINGS: Enteric tube tip is below the level of the GE junction. Heart size is normal. Pulmonary vascular congestion, but no signs of pleural effusion or frank interstitial edema. No airspace opacities. Previous left shoulder arthroplasty. Remote right posterior rib deformities. IMPRESSION: Pulmonary vascular congestion. Electronically Signed   By: Signa Kellaylor  Stroud M.D.   On: 10/09/2022 06:41   DG CHEST PORT 1 VIEW  Result Date: 10/08/2022 CLINICAL DATA:  Shortness of breath. EXAM: PORTABLE CHEST 1 VIEW COMPARISON:  CT angio chest 10/02/2022 FINDINGS: Heart size appears normal. Increased pulmonary vascularity identified. No pleural effusion or edema. No airspace opacities identified. Multiple remote right posterior and lateral rib deformities are identified as seen on CT from 10/02/2022. Status post left shoulder arthroplasty. IMPRESSION: 1. No acute cardiopulmonary abnormalities. 2. Increased pulmonary  vascularity. Electronically Signed   By: Kerby Moors M.D.   On: 10/08/2022 07:52   CT L-SPINE NO CHARGE  Result Date: 10/02/2022 CLINICAL DATA:  Recent fall with L1  compression fracture on CTA of the abdomen and pelvis EXAM: CT THORACIC SPINE WITHOUT CONTRAST; CT LUMBAR SPINE WITHOUT CONTRAST TECHNIQUE: Multidetector CT images of the thoracic and lumbar spine. Were obtained using the standard protocol without intravenous contrast. RADIATION DOSE REDUCTION: This exam was performed according to the departmental dose-optimization program which includes automated exposure control, adjustment of the mA and/or kV according to patient size and/or use of iterative reconstruction technique. COMPARISON:  02/02/2022 FINDINGS: CT of the thoracic spine: Alignment: Normal sick kyphosis is noted. Vertebrae: 12 thoracic vertebra are noted. Chronic compression fractures are noted at T1, T3 and T2 for. These are stable from prior CT from May of 2023. Multilevel osteophytic changes are noted. No other fractures are noted. Multiple right-sided rib fractures are noted with varying degrees of healing similar to that seen on recent CTA of the chest. Paraspinal and other soft tissues: Paraspinal soft tissues are within normal limits. No focal hematoma is noted. Disc levels: No significant acute disc pathology is noted CT of lumbar spine: Alignment: Normal lordosis is noted. Vertebrae: 5 lumbar type vertebral bodies are well visualized. Vertebral body height is well maintained with the exception of an acute L1 compression fracture with approximately 30% height loss identified. No other compression fractures are seen. Changes of prior screw fixation of the sacroiliac joints are seen. Prior right iliac bone fracture adjacent to the SI joint is seen with callus formation consistent with healing. Paraspinal and other soft tissues: Paraspinal soft tissues are within normal limits. Disc levels: No specific disc pathology is noted. IMPRESSION: CT of the thoracic spine chronic compression deformities as described. Multiple old right rib fractures are seen. CT of the lumbar spine: Acute L1 compression fracture  with 30% height loss identified. Changes of prior SI joint fixation and the right iliac bone fracture with healing are seen. Electronically Signed   By: Inez Catalina M.D.   On: 10/02/2022 21:31   CT T-SPINE NO CHARGE  Result Date: 10/02/2022 CLINICAL DATA:  Recent fall with L1 compression fracture on CTA of the abdomen and pelvis EXAM: CT THORACIC SPINE WITHOUT CONTRAST; CT LUMBAR SPINE WITHOUT CONTRAST TECHNIQUE: Multidetector CT images of the thoracic and lumbar spine. Were obtained using the standard protocol without intravenous contrast. RADIATION DOSE REDUCTION: This exam was performed according to the departmental dose-optimization program which includes automated exposure control, adjustment of the mA and/or kV according to patient size and/or use of iterative reconstruction technique. COMPARISON:  02/02/2022 FINDINGS: CT of the thoracic spine: Alignment: Normal sick kyphosis is noted. Vertebrae: 12 thoracic vertebra are noted. Chronic compression fractures are noted at T1, T3 and T2 for. These are stable from prior CT from May of 2023. Multilevel osteophytic changes are noted. No other fractures are noted. Multiple right-sided rib fractures are noted with varying degrees of healing similar to that seen on recent CTA of the chest. Paraspinal and other soft tissues: Paraspinal soft tissues are within normal limits. No focal hematoma is noted. Disc levels: No significant acute disc pathology is noted CT of lumbar spine: Alignment: Normal lordosis is noted. Vertebrae: 5 lumbar type vertebral bodies are well visualized. Vertebral body height is well maintained with the exception of an acute L1 compression fracture with approximately 30% height loss identified. No other compression fractures are seen. Changes of prior screw  fixation of the sacroiliac joints are seen. Prior right iliac bone fracture adjacent to the SI joint is seen with callus formation consistent with healing. Paraspinal and other soft  tissues: Paraspinal soft tissues are within normal limits. Disc levels: No specific disc pathology is noted. IMPRESSION: CT of the thoracic spine chronic compression deformities as described. Multiple old right rib fractures are seen. CT of the lumbar spine: Acute L1 compression fracture with 30% height loss identified. Changes of prior SI joint fixation and the right iliac bone fracture with healing are seen. Electronically Signed   By: Alcide CleverMark  Lukens M.D.   On: 10/02/2022 21:31   CT Angio Chest/Abd/Pel for Dissection W and/or Wo Contrast  Result Date: 10/02/2022 CLINICAL DATA:  Recent trip and fall with back pain, initial encounter EXAM: CT ANGIOGRAPHY CHEST, ABDOMEN AND PELVIS TECHNIQUE: Non-contrast CT of the chest was initially obtained. Multidetector CT imaging through the chest, abdomen and pelvis was performed using the standard protocol during bolus administration of intravenous contrast. Multiplanar reconstructed images and MIPs were obtained and reviewed to evaluate the vascular anatomy. RADIATION DOSE REDUCTION: This exam was performed according to the departmental dose-optimization program which includes automated exposure control, adjustment of the mA and/or kV according to patient size and/or use of iterative reconstruction technique. CONTRAST:  100mL OMNIPAQUE IOHEXOL 350 MG/ML SOLN COMPARISON:  08/12/2022 FINDINGS: CTA CHEST FINDINGS Cardiovascular: Initial noncontrast images demonstrate no significant atherosclerotic calcifications of the thoracic aorta. Post-contrast images demonstrate a normal branching pattern of the thoracic aorta. No evidence of aneurysmal dilatation or dissection is seen. No cardiac enlargement is noted. Pulmonary artery as visualized is within normal limits. No significant coronary calcifications are seen. Mediastinum/Nodes: Thoracic inlet is within normal limits. No hilar or mediastinal adenopathy is noted. The esophagus is within normal limits. Lungs/Pleura: Lungs are  well aerated bilaterally. Emphysematous changes are seen. No focal infiltrate or sizable effusion is seen. No pneumothorax is noted. Musculoskeletal: Multiple old rib fractures with healing are noted on the right posteriorly. No acute rib fracture is seen. Prior shoulder replacement on the left is noted. Multiple chronic appearing compression deformities are noted involving T1, T3 and T4. Additionally, an acute appearing L1 fracture is noted with proximally 30% vertebral body height loss. Review of the MIP images confirms the above findings. CTA ABDOMEN AND PELVIS FINDINGS VASCULAR Aorta: Atherosclerotic calcifications are noted without aneurysmal dilatation or dissection. Celiac: Patent without evidence of aneurysm, dissection, vasculitis or significant stenosis. SMA: Patent without evidence of aneurysm, dissection, vasculitis or significant stenosis. Renals: Single renal artery is noted on right with dual renal arteries on the left. Mild atherosclerotic changes are noted. No focal stenoses are seen. IMA: Patent without evidence of aneurysm, dissection, vasculitis or significant stenosis. Inflow: Iliacs show atherosclerotic calcification without focal abnormality. Veins: No specific venous abnormality is seen. Review of the MIP images confirms the above findings. NON-VASCULAR Hepatobiliary: No focal liver abnormality is seen. No gallstones, gallbladder wall thickening, or biliary dilatation. Pancreas: Unremarkable. No pancreatic ductal dilatation or surrounding inflammatory changes. Spleen: Normal in size without focal abnormality. Adrenals/Urinary Tract: Adrenal glands are within normal limits. Kidneys show a normal enhancement pattern bilaterally. Cysts are noted bilaterally the largest of which seen in the upper pole of the right kidney measuring 4.3 cm. These are simple in nature and no further follow-up is recommended. No renal calculi or obstructive changes are seen. The bladder is well distended.  Stomach/Bowel: Scattered diverticular change of the colon is noted. No evidence of diverticulitis is seen. The  appendix is air-filled and within normal limits. Small bowel and stomach are unremarkable. Lymphatic: No sizable adenopathy is noted. Reproductive: Prostate is unremarkable. Other: No abdominal wall hernia or abnormality. No abdominopelvic ascites. Musculoskeletal: Postsurgical changes are noted in the right hip. Prior pubic rami fractures with healing are noted on the right. 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 These changes are stable from the prior study. Prior screw fixation of the sacroiliac joints is seen. Fracture involving the right iliac bone adjacent to the sacroiliac joint is again seen with some callus formation consistent with. The known L1 compression fracture is seen. Again approximately 30% height loss is noted. Review of the MIP images confirms the above findings. IMPRESSION: CTA of the chest: No evidence of aortic dissection or pulmonary emboli. Multiple old rib fractures are identified on the right. Additionally multiple chronic appearing compression deformities are noted throughout the thoracic spine as described. These are stable from prior CT from May of 2023. CTA of the abdomen and pelvis: No arterial abnormality is noted. Acute L1 compression fracture. 30% height loss is noted. Diverticulosis without diverticulitis. Chronic pubic rami fractures and right iliac bone fractures as described Electronically Signed   By: Alcide Clever M.D.   On: 10/02/2022 21:24    Assessment/Plan 1. Other closed fracture of first lumbar vertebra with routine healing, subsequent encounter -pain not controlled on current regimen -schedule tylenol 650 mg TID and increase oxycodone for 7 days  - oxyCODONE (ROXICODONE) 5 MG immediate release tablet; Take 1-2 tablets (5-10 mg total) by mouth every 4 (four) hours as needed for severe pain or moderate pain.  Dispense: 90 tablet; Refill:  0  2. Drug-induced constipation -increase miralax to BID, continue other medication regimen    Brieanne Mignone K. [facility] [phone]

## 2022-10-27 ENCOUNTER — Encounter: Payer: Self-pay | Admitting: Nurse Practitioner

## 2022-10-27 ENCOUNTER — Non-Acute Institutional Stay (SKILLED_NURSING_FACILITY): Payer: Medicare Other | Admitting: Nurse Practitioner

## 2022-10-27 DIAGNOSIS — S32010A Wedge compression fracture of first lumbar vertebra, initial encounter for closed fracture: Secondary | ICD-10-CM

## 2022-10-27 DIAGNOSIS — K5903 Drug induced constipation: Secondary | ICD-10-CM | POA: Diagnosis not present

## 2022-10-27 MED ORDER — OXYCODONE HCL 5 MG PO TABS: 10.0000 mg | ORAL_TABLET | ORAL | 0 refills | Status: DC | PRN

## 2022-10-27 MED ORDER — PREGABALIN 50 MG PO CAPS: 50.0000 mg | ORAL_CAPSULE | Freq: Two times a day (BID) | ORAL | 1 refills | Status: DC

## 2022-10-27 MED ORDER — LACTULOSE 20 GM/30ML PO SOLN: 15.0000 mL | Freq: Every day | ORAL | Status: DC

## 2022-10-27 NOTE — Progress Notes (Signed)
Location:   [facility] Room Number: 105A Place of Service:  SNF ([phone]) Provider:  Sherrie Mustache, NP  Ziglar, Lincoln Brigham, MD  Patient Care Team: Macarthur Critchley, MD as PCP - General (Family Medicine) Deneise Lever, MD as Consulting Physician (Pulmonary Disease) Zadie Rhine Clent Demark, MD as Consulting Physician (Ophthalmology)  Extended Emergency Contact Information Primary Emergency Contact: [place] Mobile Phone: [phone] Relation: Daughter Secondary Emergency Contact: [place] Work Phone: [phone] Relation: Significant other  Code Status:  FULL CODE Goals of care: Advanced Directive information    10/27/2022    2:37 PM  Advanced Directives  Does Patient Have a Medical Advance Directive? No  Would patient like information on creating a medical advance directive? No - Patient declined     Chief Complaint  Patient presents with   Acute Visit    Pain    HPI:  Pt is a 67 y.o. male seen today for an acute visit for pain.  Daughter is here today and very concern over the amount of pain.  He is getting worse with his PT and concern over this as well.  He has not follow up with neurosurgery but has made an appt for next week. His fall was on 10/02/22 and his pain has not improved at all. Daughter reports when he fractured his pelvis he was up and moving around "in no time"  When he takes the oxycodone 2 tablets the pain is more controled but if he only takes 1 tablet pain is not tolerable.  Unable to describe how pain feels but very intense.   Continues to have constipation, reports this has become an issue- he is on multiple medications for constipation.    Past Medical History:  Diagnosis Date   COPD (chronic obstructive pulmonary disease) (HCC)    COPD (chronic obstructive pulmonary disease) (HCC)    GERD (gastroesophageal reflux disease)    History of hepatitis C    treated several years ago  through [facility]   Hyperlipidemia    Hypertension    OSA (obstructive sleep apnea)    Past Surgical History:  Procedure Laterality Date   CATARACT EXTRACTION, BILATERAL     EYE SURGERY Bilateral    IR THORACENTESIS ASP PLEURAL SPACE W/IMG GUIDE  09/14/2021   IR THORACENTESIS ASP PLEURAL SPACE W/IMG GUIDE  09/21/2021   ORIF PELVIC FRACTURE WITH PERCUTANEOUS SCREWS Right 07/29/2021   Procedure: SI SCREW FIXATION OF RIGHT PELVIC RING;  Surgeon: Altamese Franklin, MD;  Location: [facility];  Service: Orthopedics;  Laterality: Right;   ORTHOPEDIC SURGERY Bilateral    pt states fracture repairs to arms and legs.   REVERSE SHOULDER ARTHROPLASTY Left 09/23/2019   Procedure: REVERSE SHOULDER ARTHROPLASTY;  Surgeon: Tania Ade, MD;  Location: [facility];  Service: Orthopedics;  Laterality: Left;   TRACHEOSTOMY TUBE PLACEMENT N/A 08/10/2021   Procedure: TRACHEOSTOMY;  Surgeon: Georganna Skeans, MD;  Location: [facility];  Service: General;  Laterality: N/A;    No Known Allergies  Allergies as of 10/27/2022   No Known Allergies      Medication List        Accurate as of October 27, 2022  2:51 PM. If you have any questions, ask your nurse or doctor.          acetaminophen 325 MG tablet Commonly known as: TYLENOL Take 650 mg by mouth every 8 (eight) hours as needed.   albuterol 108 (90 Base) MCG/ACT inhaler Commonly known as:  VENTOLIN HFA Inhale 2 puffs into the lungs every 4 (four) hours as needed for wheezing or shortness of breath.   albuterol (2.5 MG/3ML) 0.083% nebulizer solution Commonly known as: PROVENTIL Take 2.5 mg by nebulization every 6 (six) hours as needed for wheezing or shortness of breath.   aspirin EC 81 MG tablet Take 81 mg by mouth daily.   atorvastatin 40 MG tablet Commonly known as: LIPITOR TAKE 1 TABLET BY MOUTH ONCE DAILY   cyclobenzaprine 5 MG tablet Commonly known as: FLEXERIL Take 5 mg by mouth every 8 (eight) hours as needed for muscle spasms.   fluocinonide  0.05 % external solution Commonly known as: LIDEX Apply 1 Application topically 2 (two) times daily as needed (skin irritation).   folic acid 1 MG tablet Commonly known as: FOLVITE Take 1 tablet (1 mg total) by mouth daily.   ketoconazole 2 % cream Commonly known as: NIZORAL Apply 1 Application topically 2 (two) times daily as needed for irritation.   ketoconazole 2 % shampoo Commonly known as: NIZORAL Apply 1 Application topically 2 (two) times a week.   lidocaine 5 % Commonly known as: LIDODERM Place 1 patch onto the skin at bedtime. Remove & Discard patch within 12 hours or as directed by MD   lisinopril 10 MG tablet Commonly known as: ZESTRIL Take 1 tablet (10 mg total) by mouth daily.   metoprolol succinate 25 MG 24 hr tablet Commonly known as: TOPROL-XL Take 25 mg by mouth daily.   MULTI FOR HIM 50+ PO Take 1 tablet by mouth daily.   oxyCODONE 5 MG immediate release tablet Commonly known as: Oxy IR/ROXICODONE Take 5 mg by mouth every 6 (six) hours as needed for severe pain. Pain scale 0-5 What changed: Another medication with the same name was removed. Continue taking this medication, and follow the directions you see here. Changed by: Lauree Chandler, NP   oxyCODONE 5 MG immediate release tablet Commonly known as: Oxy IR/ROXICODONE Take 5 mg by mouth every 4 (four) hours as needed for severe pain. Pain scale 6-10 for 7 days What changed: Another medication with the same name was removed. Continue taking this medication, and follow the directions you see here. Changed by: Lauree Chandler, NP   OXYGEN Inhale 3 L into the lungs at bedtime.   pantoprazole sodium 40 mg Commonly known as: PROTONIX Take 40 mg by mouth daily.   polyethylene glycol 17 g packet Commonly known as: MIRALAX / GLYCOLAX Take 17 g by mouth daily as needed for moderate constipation.   roflumilast 500 MCG Tabs tablet Commonly known as: DALIRESP Take 500 mcg by mouth daily.    senna-docusate 8.6-50 MG tablet Commonly known as: Senokot-S Take 1 tablet by mouth at bedtime.   tamsulosin 0.4 MG Caps capsule Commonly known as: FLOMAX Take 0.4 mg by mouth daily.   theophylline 200 MG 24 hr capsule Commonly known as: THEO-24 Take 200 mg by mouth 2 (two) times daily.   thiamine 100 MG tablet Commonly known as: Vitamin B-1 Take 1 tablet (100 mg total) by mouth daily.   Trelegy Ellipta 100-62.5-25 MCG/ACT Aepb Generic drug: Fluticasone-Umeclidin-Vilant INHALE 1 PUFF INTO THE LUNGS EVERY DAY AT THE SAME TIME EACH DAY   Vitamin D3 25 MCG (1000 UT) Caps Give 2 by mouth once daily.        Review of Systems  Constitutional:  Negative for activity change, appetite change, fatigue and unexpected weight change.  HENT:  Negative for congestion and hearing  loss.   Eyes: Negative.   Respiratory:  Negative for cough and shortness of breath.   Cardiovascular:  Negative for chest pain, palpitations and leg swelling.  Gastrointestinal:  Positive for constipation. Negative for abdominal pain and diarrhea.  Genitourinary:  Negative for difficulty urinating and dysuria.  Musculoskeletal:  Positive for back pain and myalgias. Negative for arthralgias.  Skin:  Negative for color change and wound.  Neurological:  Negative for dizziness and weakness.  Psychiatric/Behavioral:  Negative for agitation, behavioral problems and confusion.     Immunization History  Administered Date(s) Administered   Influenza Split 06/19/2017   Influenza,inj,Quad PF,6+ Mos 10/01/2014, 07/27/2015, 08/20/2018, 06/21/2019, 06/21/2021   Influenza-Unspecified 06/19/2020, 07/18/2022   Moderna Sars-Covid-2 Vaccination 11/13/2019, 12/04/2019, 12/09/2019, 12/30/2019   PPD Test 08/19/2015   Pneumococcal Conjugate-13 10/01/2014   Pneumococcal Polysaccharide-23 01/10/2018   Tdap 08/09/2019   Pertinent  Health Maintenance Due  Topic Date Due   COLONOSCOPY (Pts 45-42yr Insurance coverage will need  to be confirmed)  09/20/2027   INFLUENZA VACCINE  Completed      08/29/2021    8:00 AM 08/29/2021    8:00 PM 08/30/2021    8:00 AM 10/02/2022    6:01 PM 10/03/2022    6:01 AM  Fall Risk  (RETIRED) Patient Fall Risk Level High fall risk High fall risk High fall risk High fall risk High fall risk   Functional Status Survey:    Vitals:   10/27/22 1436  BP: 132/89  Pulse: (!) 112  Resp: 20  Temp: (!) 97.4 F (36.3 C)  SpO2: 91%  Weight: 222 lb 6.4 oz (100.9 kg)  Height: 6' 2"$  (1.88 m)   Body mass index is 28.55 kg/m. Physical Exam Constitutional:      General: He is not in acute distress.    Appearance: He is well-developed. He is not diaphoretic.  HENT:     Head: Normocephalic and atraumatic.     Right Ear: External ear normal.     Left Ear: External ear normal.     Mouth/Throat:     Pharynx: No oropharyngeal exudate.  Eyes:     Conjunctiva/sclera: Conjunctivae normal.     Pupils: Pupils are equal, round, and reactive to light.  Cardiovascular:     Rate and Rhythm: Normal rate and regular rhythm.     Heart sounds: Normal heart sounds.  Pulmonary:     Effort: Pulmonary effort is normal.     Breath sounds: Normal breath sounds.  Abdominal:     General: Bowel sounds are normal.     Palpations: Abdomen is soft.  Musculoskeletal:        General: No tenderness.     Cervical back: Normal range of motion and neck supple.     Right lower leg: No edema.     Left lower leg: No edema.  Skin:    General: Skin is warm and dry.  Neurological:     Mental Status: He is alert and oriented to person, place, and time.     Gait: Gait abnormal.     Labs reviewed: Recent Labs    10/12/22 0322 10/13/22 0201 10/14/22 0201  NA 136 133* 135  K 3.2* 3.6 3.7  CL 106 104 106  CO2 23 22 21*  GLUCOSE 110* 108* 104*  BUN 13 9 7*  CREATININE 0.70 0.69 0.68  CALCIUM 8.9 8.6* 9.0  MG 1.8 2.0 2.0  PHOS  --  2.8  --    Recent Labs  10/09/22 0356 10/10/22 0053 10/13/22 0201   AST 37 29 32  ALT 38 39 36  ALKPHOS 98 96 98  BILITOT 0.9 0.9 0.5  PROT 7.4 7.0 6.5  ALBUMIN 3.9 3.6 3.2*   Recent Labs    10/12/22 0322 10/13/22 0201 10/14/22 0201  WBC 7.6 6.8 6.6  NEUTROABS 5.7 4.7 4.5  HGB 13.4 12.7* 13.2  HCT 37.7* 35.9* 38.0*  MCV 96.2 96.2 96.9  PLT 256 254 245   Lab Results  Component Value Date   TSH 10.767 (H) 08/04/2021   Lab Results  Component Value Date   HGBA1C 5.4 07/28/2021   Lab Results  Component Value Date   CHOL 185 02/09/2022   HDL 61 02/09/2022   LDLCALC 85 02/09/2022   LDLDIRECT 106.0 01/29/2021   TRIG 235 (H) 02/09/2022   CHOLHDL 3.0 02/09/2022    Significant Diagnostic Results in last 30 days:  ECHOCARDIOGRAM COMPLETE  Result Date: 10/09/2022    ECHOCARDIOGRAM REPORT   Patient Name:   [name] Date of Exam: 10/09/2022 Medical Rec #:  [id number]        Height:       74.0 in Accession #:    [id number]       Weight:       230.0 lb Date of Birth:  [birth year]       BSA:          2.306 m Patient Age:    11 years         BP:           117/83 mmHg Patient Gender: M                HR:           115 bpm. Exam Location:  Inpatient Procedure: 2D Echo, Cardiac Doppler, Color Doppler and Intracardiac            Opacification Agent Indications:    CHF-acute diastolic  History:        Patient has no prior history of Echocardiogram examinations.                 COPD; Risk Factors:Sleep Apnea, Hypertension and Former Smoker.                 ETOH abuse.  Sonographer:    Clayton Lefort RDCS (AE) Referring Phys: Etta Quill  Sonographer Comments: Technically challenging study due to limited acoustic windows, Technically difficult study due to poor echo windows, suboptimal parasternal window, suboptimal apical window and suboptimal subcostal window. Image acquisition challenging due to COPD and Image acquisition challenging due to respiratory motion. IMPRESSIONS  1. Technically difficult study. Left ventricular ejection fraction, by estimation, is  60 to 65%. The left ventricle has normal function. The left ventricle has no regional wall motion abnormalities. Left ventricular diastolic parameters are indeterminate.  2. Right ventricular systolic function is normal. The right ventricular size is mildly enlarged.  3. The mitral valve is normal in structure. No evidence of mitral valve regurgitation.  4. The aortic valve was not well visualized. Aortic valve regurgitation is not visualized. No aortic stenosis is present.  5. The inferior vena cava is normal in size with greater than 50% respiratory variability, suggesting right atrial pressure of 3 mmHg. FINDINGS  Left Ventricle: Left ventricular ejection fraction, by estimation, is 60 to 65%. The left ventricle has normal function. The left ventricle has no regional wall motion abnormalities. Definity contrast agent was given IV  to delineate the left ventricular  endocardial borders. The left ventricular internal cavity size was normal in size. There is no left ventricular hypertrophy. Left ventricular diastolic parameters are indeterminate. Right Ventricle: The right ventricular size is mildly enlarged. Right vetricular wall thickness was not well visualized. Right ventricular systolic function is normal. Left Atrium: Left atrial size was normal in size. Right Atrium: Right atrial size was normal in size. Pericardium: There is no evidence of pericardial effusion. Mitral Valve: The mitral valve is normal in structure. No evidence of mitral valve regurgitation. Tricuspid Valve: The tricuspid valve is normal in structure. Tricuspid valve regurgitation is not demonstrated. Aortic Valve: The aortic valve was not well visualized. Aortic valve regurgitation is not visualized. No aortic stenosis is present. Aortic valve mean gradient measures 4.0 mmHg. Aortic valve peak gradient measures 7.2 mmHg. Aortic valve area, by VTI measures 3.62 cm. Pulmonic Valve: The pulmonic valve was not well visualized. Pulmonic valve  regurgitation is not visualized. Aorta: The aortic root is normal in size and structure. Venous: The inferior vena cava is normal in size with greater than 50% respiratory variability, suggesting right atrial pressure of 3 mmHg. IAS/Shunts: The interatrial septum was not well visualized.  LEFT VENTRICLE PLAX 2D LVIDd:         4.80 cm LVIDs:         4.10 cm LV PW:         1.00 cm LV IVS:        1.10 cm LVOT diam:     2.50 cm LV SV:         69 LV SV Index:   30 LVOT Area:     4.91 cm  RIGHT VENTRICLE RV Basal diam:  4.30 cm RV Mid diam:    3.90 cm RV S prime:     17.50 cm/s TAPSE (M-mode): 2.4 cm LEFT ATRIUM             Index        RIGHT ATRIUM           Index LA diam:        2.40 cm 1.04 cm/m   RA Area:     14.60 cm LA Vol (A2C):   46.4 ml 20.12 ml/m  RA Volume:   35.20 ml  15.26 ml/m LA Vol (A4C):   27.5 ml 11.92 ml/m LA Biplane Vol: 37.3 ml 16.17 ml/m  AORTIC VALVE AV Area (Vmax):    3.32 cm AV Area (Vmean):   3.04 cm AV Area (VTI):     3.62 cm AV Vmax:           134.00 cm/s AV Vmean:          95.400 cm/s AV VTI:            0.191 m AV Peak Grad:      7.2 mmHg AV Mean Grad:      4.0 mmHg LVOT Vmax:         90.70 cm/s LVOT Vmean:        59.100 cm/s LVOT VTI:          0.141 m LVOT/AV VTI ratio: 0.74  AORTA Ao Root diam: 3.90 cm  SHUNTS Systemic VTI:  0.14 m Systemic Diam: 2.50 cm Oswaldo Milian MD Electronically signed by Oswaldo Milian MD Signature Date/Time: 10/09/2022/2:35:54 PM    Final    DG Chest Port 1 View  Result Date: 10/09/2022 CLINICAL DATA:  Acute respiratory distress. Evaluate for pleural effusion or  edema. EXAM: PORTABLE CHEST 1 VIEW COMPARISON:  10/08/2022 FINDINGS: Enteric tube tip is below the level of the GE junction. Heart size is normal. Pulmonary vascular congestion, but no signs of pleural effusion or frank interstitial edema. No airspace opacities. Previous left shoulder arthroplasty. Remote right posterior rib deformities. IMPRESSION: Pulmonary vascular congestion.  Electronically Signed   By: Kerby Moors M.D.   On: 10/09/2022 06:41   DG CHEST PORT 1 VIEW  Result Date: 10/08/2022 CLINICAL DATA:  Shortness of breath. EXAM: PORTABLE CHEST 1 VIEW COMPARISON:  CT angio chest 10/02/2022 FINDINGS: Heart size appears normal. Increased pulmonary vascularity identified. No pleural effusion or edema. No airspace opacities identified. Multiple remote right posterior and lateral rib deformities are identified as seen on CT from 10/02/2022. Status post left shoulder arthroplasty. IMPRESSION: 1. No acute cardiopulmonary abnormalities. 2. Increased pulmonary vascularity. Electronically Signed   By: Kerby Moors M.D.   On: 10/08/2022 07:52   CT L-SPINE NO CHARGE  Result Date: 10/02/2022 CLINICAL DATA:  Recent fall with L1 compression fracture on CTA of the abdomen and pelvis EXAM: CT THORACIC SPINE WITHOUT CONTRAST; CT LUMBAR SPINE WITHOUT CONTRAST TECHNIQUE: Multidetector CT images of the thoracic and lumbar spine. Were obtained using the standard protocol without intravenous contrast. RADIATION DOSE REDUCTION: This exam was performed according to the departmental dose-optimization program which includes automated exposure control, adjustment of the mA and/or kV according to patient size and/or use of iterative reconstruction technique. COMPARISON:  02/02/2022 FINDINGS: CT of the thoracic spine: Alignment: Normal sick kyphosis is noted. Vertebrae: 12 thoracic vertebra are noted. Chronic compression fractures are noted at T1, T3 and T2 for. These are stable from prior CT from May of 2023. Multilevel osteophytic changes are noted. No other fractures are noted. Multiple right-sided rib fractures are noted with varying degrees of healing similar to that seen on recent CTA of the chest. Paraspinal and other soft tissues: Paraspinal soft tissues are within normal limits. No focal hematoma is noted. Disc levels: No significant acute disc pathology is noted CT of lumbar spine: Alignment:  Normal lordosis is noted. Vertebrae: 5 lumbar type vertebral bodies are well visualized. Vertebral body height is well maintained with the exception of an acute L1 compression fracture with approximately 30% height loss identified. No other compression fractures are seen. Changes of prior screw fixation of the sacroiliac joints are seen. Prior right iliac bone fracture adjacent to the SI joint is seen with callus formation consistent with healing. Paraspinal and other soft tissues: Paraspinal soft tissues are within normal limits. Disc levels: No specific disc pathology is noted. IMPRESSION: CT of the thoracic spine chronic compression deformities as described. Multiple old right rib fractures are seen. CT of the lumbar spine: Acute L1 compression fracture with 30% height loss identified. Changes of prior SI joint fixation and the right iliac bone fracture with healing are seen. Electronically Signed   By: Inez Catalina M.D.   On: 10/02/2022 21:31   CT T-SPINE NO CHARGE  Result Date: 10/02/2022 CLINICAL DATA:  Recent fall with L1 compression fracture on CTA of the abdomen and pelvis EXAM: CT THORACIC SPINE WITHOUT CONTRAST; CT LUMBAR SPINE WITHOUT CONTRAST TECHNIQUE: Multidetector CT images of the thoracic and lumbar spine. Were obtained using the standard protocol without intravenous contrast. RADIATION DOSE REDUCTION: This exam was performed according to the departmental dose-optimization program which includes automated exposure control, adjustment of the mA and/or kV according to patient size and/or use of iterative reconstruction technique. COMPARISON:  02/02/2022 FINDINGS:  CT of the thoracic spine: Alignment: Normal sick kyphosis is noted. Vertebrae: 12 thoracic vertebra are noted. Chronic compression fractures are noted at T1, T3 and T2 for. These are stable from prior CT from May of 2023. Multilevel osteophytic changes are noted. No other fractures are noted. Multiple right-sided rib fractures are noted  with varying degrees of healing similar to that seen on recent CTA of the chest. Paraspinal and other soft tissues: Paraspinal soft tissues are within normal limits. No focal hematoma is noted. Disc levels: No significant acute disc pathology is noted CT of lumbar spine: Alignment: Normal lordosis is noted. Vertebrae: 5 lumbar type vertebral bodies are well visualized. Vertebral body height is well maintained with the exception of an acute L1 compression fracture with approximately 30% height loss identified. No other compression fractures are seen. Changes of prior screw fixation of the sacroiliac joints are seen. Prior right iliac bone fracture adjacent to the SI joint is seen with callus formation consistent with healing. Paraspinal and other soft tissues: Paraspinal soft tissues are within normal limits. Disc levels: No specific disc pathology is noted. IMPRESSION: CT of the thoracic spine chronic compression deformities as described. Multiple old right rib fractures are seen. CT of the lumbar spine: Acute L1 compression fracture with 30% height loss identified. Changes of prior SI joint fixation and the right iliac bone fracture with healing are seen. Electronically Signed   By: Inez Catalina M.D.   On: 10/02/2022 21:31   CT Angio Chest/Abd/Pel for Dissection W and/or Wo Contrast  Result Date: 10/02/2022 CLINICAL DATA:  Recent trip and fall with back pain, initial encounter EXAM: CT ANGIOGRAPHY CHEST, ABDOMEN AND PELVIS TECHNIQUE: Non-contrast CT of the chest was initially obtained. Multidetector CT imaging through the chest, abdomen and pelvis was performed using the standard protocol during bolus administration of intravenous contrast. Multiplanar reconstructed images and MIPs were obtained and reviewed to evaluate the vascular anatomy. RADIATION DOSE REDUCTION: This exam was performed according to the departmental dose-optimization program which includes automated exposure control, adjustment of the mA  and/or kV according to patient size and/or use of iterative reconstruction technique. CONTRAST:  145m OMNIPAQUE IOHEXOL 350 MG/ML SOLN COMPARISON:  08/12/2022 FINDINGS: CTA CHEST FINDINGS Cardiovascular: Initial noncontrast images demonstrate no significant atherosclerotic calcifications of the thoracic aorta. Post-contrast images demonstrate a normal branching pattern of the thoracic aorta. No evidence of aneurysmal dilatation or dissection is seen. No cardiac enlargement is noted. Pulmonary artery as visualized is within normal limits. No significant coronary calcifications are seen. Mediastinum/Nodes: Thoracic inlet is within normal limits. No hilar or mediastinal adenopathy is noted. The esophagus is within normal limits. Lungs/Pleura: Lungs are well aerated bilaterally. Emphysematous changes are seen. No focal infiltrate or sizable effusion is seen. No pneumothorax is noted. Musculoskeletal: Multiple old rib fractures with healing are noted on the right posteriorly. No acute rib fracture is seen. Prior shoulder replacement on the left is noted. Multiple chronic appearing compression deformities are noted involving T1, T3 and T4. Additionally, an acute appearing L1 fracture is noted with proximally 30% vertebral body height loss. Review of the MIP images confirms the above findings. CTA ABDOMEN AND PELVIS FINDINGS VASCULAR Aorta: Atherosclerotic calcifications are noted without aneurysmal dilatation or dissection. Celiac: Patent without evidence of aneurysm, dissection, vasculitis or significant stenosis. SMA: Patent without evidence of aneurysm, dissection, vasculitis or significant stenosis. Renals: Single renal artery is noted on right with dual renal arteries on the left. Mild atherosclerotic changes are noted. No focal stenoses are seen.  IMA: Patent without evidence of aneurysm, dissection, vasculitis or significant stenosis. Inflow: Iliacs show atherosclerotic calcification without focal abnormality.  Veins: No specific venous abnormality is seen. Review of the MIP images confirms the above findings. NON-VASCULAR Hepatobiliary: No focal liver abnormality is seen. No gallstones, gallbladder wall thickening, or biliary dilatation. Pancreas: Unremarkable. No pancreatic ductal dilatation or surrounding inflammatory changes. Spleen: Normal in size without focal abnormality. Adrenals/Urinary Tract: Adrenal glands are within normal limits. Kidneys show a normal enhancement pattern bilaterally. Cysts are noted bilaterally the largest of which seen in the upper pole of the right kidney measuring 4.3 cm. These are simple in nature and no further follow-up is recommended. No renal calculi or obstructive changes are seen. The bladder is well distended. Stomach/Bowel: Scattered diverticular change of the colon is noted. No evidence of diverticulitis is seen. The appendix is air-filled and within normal limits. Small bowel and stomach are unremarkable. Lymphatic: No sizable adenopathy is noted. Reproductive: Prostate is unremarkable. Other: No abdominal wall hernia or abnormality. No abdominopelvic ascites. Musculoskeletal: Postsurgical changes are noted in the right hip. Prior pubic rami fractures with healing are noted on the right. 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 These changes are stable from the prior study. Prior screw fixation of the sacroiliac joints is seen. Fracture involving the right iliac bone adjacent to the sacroiliac joint is again seen with some callus formation consistent with. The known L1 compression fracture is seen. Again approximately 30% height loss is noted. Review of the MIP images confirms the above findings. IMPRESSION: CTA of the chest: No evidence of aortic dissection or pulmonary emboli. Multiple old rib fractures are identified on the right. Additionally multiple chronic appearing compression deformities are noted throughout the thoracic spine as described. These are  stable from prior CT from May of 2023. CTA of the abdomen and pelvis: No arterial abnormality is noted. Acute L1 compression fracture. 30% height loss is noted. Diverticulosis without diverticulitis. Chronic pubic rami fractures and right iliac bone fractures as described Electronically Signed   By: Inez Catalina M.D.   On: 10/02/2022 21:24    Assessment/Plan 1. Closed compression fracture of body of L1 vertebra (HCC) -has appt with neurosurgery for ongoing evaluation in 1 week due to ongoing intense pain.  -will continue oxycodone 2 tablets at this time and add lyrica to see if this helps with pain management.  - oxyCODONE (OXY IR/ROXICODONE) 5 MG immediate release tablet; Take 2 tablets (10 mg total) by mouth every 4 (four) hours as needed for severe pain. Pain scale 0-5  Dispense: 120 tablet; Refill: 0 - pregabalin (LYRICA) 50 MG capsule; Take 1 capsule (50 mg total) by mouth 2 (two) times daily.  Dispense: 60 capsule; Refill: 1  2. Drug-induced constipation -ADD lactulose to current regimen to help manage  constipation  - Lactulose 20 GM/30ML SOLN; Take 15 mLs (10 g total) by mouth daily.  Dispense: 450 mL   Andie Mortimer K. [place], [facility] [phone]

## 2022-11-02 ENCOUNTER — Other Ambulatory Visit: Payer: Self-pay

## 2022-11-02 DIAGNOSIS — S32010A Wedge compression fracture of first lumbar vertebra, initial encounter for closed fracture: Secondary | ICD-10-CM

## 2022-11-02 NOTE — Progress Notes (Unsigned)
Referring Physician:  Kerney Elbe, DO [address] [place],  [place] [address]  Primary Physician:  Macarthur Critchley, MD  History of Present Illness: 11/03/2022 [name] has a history of HTN, hyperlipidemia, COPD, hep C, alcohol use disorder (drinks 4-20 beers per day), DM, macular degeneration, GERD, CAD, OSA, and anxiety.   History of chronic thoracic compression fractures, chronic pubic rami fractures, right iliac bone fracture, and multiple old right rib fractures.   Seen in ED on 10/02/22, he had a fall the prior evening when he tripped over oxygen tubing. He crawled back into bed and woke up with severe pain. Found to have L1 compression fracture. Looks like this was discussed with neurosurgery and he was given a TLSO brace. He was admitted and discharged on 10/14/22.   He is here for follow up.   He has constant mid to lower back pain. No leg pain. No numbness, tingling, or weakness in his legs. Pain is better with his brace and worse when he is not wearing the brace (sleeping). He has seen improvement since starting lyrica. He is also taking robaxin prn.   History of pelvic fracture with fixation.   No bowel or bladder issues.   Conservative measures:  Physical therapy: is in PT at SNF for mobilization  Multimodal medical therapy including regular antiinflammatories: flexeril, motrin, lidoderm patches,  lyrica Injections: No epidural steroid injections  Past Surgery: no spinal surgery, previous pelvic fixation 2 years ago   Review of Systems:  A 10 point review of systems is negative, except for the pertinent positives and negatives detailed in the HPI.  Past Medical History: Past Medical History:  Diagnosis Date   COPD (chronic obstructive pulmonary disease) (HCC)    COPD (chronic obstructive pulmonary disease) (HCC)    GERD (gastroesophageal reflux disease)    History of hepatitis C    treated several years ago through [facility]   Hyperlipidemia     Hypertension    OSA (obstructive sleep apnea)     Past Surgical History: Past Surgical History:  Procedure Laterality Date   CATARACT EXTRACTION, BILATERAL     EYE SURGERY Bilateral    IR THORACENTESIS ASP PLEURAL SPACE W/IMG GUIDE  09/14/2021   IR THORACENTESIS ASP PLEURAL SPACE W/IMG GUIDE  09/21/2021   ORIF PELVIC FRACTURE WITH PERCUTANEOUS SCREWS Right 07/29/2021   Procedure: SI SCREW FIXATION OF RIGHT PELVIC RING;  Surgeon: Altamese [place], MD;  Location: [place];  Service: Orthopedics;  Laterality: Right;   ORTHOPEDIC SURGERY Bilateral    pt states fracture repairs to arms and legs.   REVERSE SHOULDER ARTHROPLASTY Left 09/23/2019   Procedure: REVERSE SHOULDER ARTHROPLASTY;  Surgeon: Tania Ade, MD;  Location: [facility];  Service: Orthopedics;  Laterality: Left;   TRACHEOSTOMY TUBE PLACEMENT N/A 08/10/2021   Procedure: TRACHEOSTOMY;  Surgeon: Georganna Skeans, MD;  Location: [place];  Service: General;  Laterality: N/A;    Allergies: Allergies as of 11/03/2022   (No Known Allergies)    Medications: Outpatient Encounter Medications as of 11/03/2022  Medication Sig   acetaminophen (TYLENOL) 325 MG tablet Take 650 mg by mouth every 8 (eight) hours as needed.   albuterol (PROVENTIL) (2.5 MG/3ML) 0.083% nebulizer solution Take 2.5 mg by nebulization every 6 (six) hours as needed for wheezing or shortness of breath.   albuterol (VENTOLIN HFA) 108 (90 Base) MCG/ACT inhaler Inhale 2 puffs into the lungs every 4 (four) hours as needed for wheezing or shortness of breath.  aspirin EC 81 MG tablet Take 81 mg by mouth daily.   atorvastatin (LIPITOR) 40 MG tablet TAKE 1 TABLET BY MOUTH ONCE DAILY   Cholecalciferol (VITAMIN D3) 25 MCG (1000 UT) CAPS Give 2 by mouth once daily.   cyclobenzaprine (FLEXERIL) 5 MG tablet Take 5 mg by mouth every 8 (eight) hours as needed for muscle spasms.   fluocinonide (LIDEX) 0.05 % external solution Apply 1 Application topically 2 (two) times daily as needed  (skin irritation).   Fluticasone-Umeclidin-Vilant (TRELEGY ELLIPTA) 100-62.5-25 MCG/ACT AEPB INHALE 1 PUFF INTO THE LUNGS EVERY DAY AT THE SAME TIME EACH DAY   folic acid (FOLVITE) 1 MG tablet Take 1 tablet (1 mg total) by mouth daily.   ketoconazole (NIZORAL) 2 % cream Apply 1 Application topically 2 (two) times daily as needed for irritation.   ketoconazole (NIZORAL) 2 % shampoo Apply 1 Application topically 2 (two) times a week.   Lactulose 20 GM/30ML SOLN Take 15 mLs (10 g total) by mouth daily.   lidocaine (LIDODERM) 5 % Place 1 patch onto the skin at bedtime. Remove & Discard patch within 12 hours or as directed by MD   lisinopril (ZESTRIL) 10 MG tablet Take 1 tablet (10 mg total) by mouth daily.   metoprolol succinate (TOPROL-XL) 25 MG 24 hr tablet Take 25 mg by mouth daily.   Multiple Vitamins-Minerals (MULTI FOR HIM 50+ PO) Take 1 tablet by mouth daily.   oxyCODONE (OXY IR/ROXICODONE) 5 MG immediate release tablet Take 2 tablets (10 mg total) by mouth every 4 (four) hours as needed for severe pain. Pain scale 0-5   OXYGEN Inhale 3 L into the lungs at bedtime.   pantoprazole sodium (PROTONIX) 40 mg Take 40 mg by mouth daily.   polyethylene glycol (MIRALAX / GLYCOLAX) 17 g packet Take 17 g by mouth daily as needed for moderate constipation.   pregabalin (LYRICA) 50 MG capsule Take 1 capsule (50 mg total) by mouth 2 (two) times daily.   roflumilast (DALIRESP) 500 MCG TABS tablet Take 500 mcg by mouth daily.   senna-docusate (SENOKOT-S) 8.6-50 MG tablet Take 1 tablet by mouth at bedtime.   tamsulosin (FLOMAX) 0.4 MG CAPS capsule Take 0.4 mg by mouth daily.   theophylline (THEO-24) 200 MG 24 hr capsule Take 200 mg by mouth 2 (two) times daily.   thiamine (VITAMIN B-1) 100 MG tablet Take 1 tablet (100 mg total) by mouth daily.   No facility-administered encounter medications on file as of 11/03/2022.    Social History: Social History   Tobacco Use   Smoking status: Former    Packs/day:  1.00    Years: 35.00    Total pack years: 35.00    Types: Cigarettes    Start date: 41    Quit date: 10/01/2010    Years since quitting: 12.0   Smokeless tobacco: Never  Vaping Use   Vaping Use: Never used  Substance Use Topics   Alcohol use: Yes    Alcohol/week: 42.0 standard drinks of alcohol    Types: 42 Cans of beer per week    Comment: daily - 6 drinks a day   Drug use: Never    Comment: last marijuana 2-3 weeks ago    Family Medical History: Family History  Problem Relation Age of Onset   Heart disease Mother    Hypertension Father     Physical Examination: Vitals:   11/03/22 1059  BP: 115/70    General: Patient is well developed, well nourished, calm,  collected, and in no apparent distress. Attention to examination is appropriate.  Respiratory: Patient is breathing without any difficulty.   NEUROLOGICAL:     Awake, alert, oriented to person, place, and time.  Speech is clear and fluent. Fund of knowledge is appropriate.   Cranial Nerves: Pupils equal round and reactive to light.  Facial tone is symmetric.    He is wearing TLSO brace. He has tenderness over L1 area.  ROM of lumbar spine not tested.   No abnormal lesions on exposed skin.   Strength: Side Biceps Triceps Deltoid Interossei Grip Wrist Ext. Wrist Flex.  R 5 5 5 5 5 5 5  $ L 5 5 5 5 5 5 5   $ Side Iliopsoas Quads Hamstring PF DF EHL  R 5 5 5 5 5 5  $ L 5 5 5 5 5 5   $ Reflexes are 2+ and symmetric at the biceps, triceps, brachioradialis, patella and achilles.   Hoffman's is absent.  Clonus is not present.   Bilateral upper and lower extremity sensation is intact to light touch.     Gait is not tested. He is in a WC.   Medical Decision Making  Imaging: Lumbar xrays dated 11/03/22:  L1 compression fracture that has progressed from lumbar CT on 10/02/22.   CT of thoracic and lumbar spine dated 10/02/22:  FINDINGS: CT of the thoracic spine:   Alignment: Normal sick kyphosis is noted.    Vertebrae: 12 thoracic vertebra are noted. Chronic compression fractures are noted at T1, T3 and T2 for. These are stable from prior CT from May of 2023. Multilevel osteophytic changes are noted. No other fractures are noted. Multiple right-sided rib fractures are noted with varying degrees of healing similar to that seen on recent CTA of the chest.   Paraspinal and other soft tissues: Paraspinal soft tissues are within normal limits. No focal hematoma is noted.   Disc levels: No significant acute disc pathology is noted   CT of lumbar spine:   Alignment: Normal lordosis is noted.   Vertebrae: 5 lumbar type vertebral bodies are well visualized. Vertebral body height is well maintained with the exception of an acute L1 compression fracture with approximately 30% height loss identified. No other compression fractures are seen. Changes of prior screw fixation of the sacroiliac joints are seen. Prior right iliac bone fracture adjacent to the SI joint is seen with callus formation consistent with healing.   Paraspinal and other soft tissues: Paraspinal soft tissues are within normal limits.   Disc levels: No specific disc pathology is noted.   IMPRESSION: CT of the thoracic spine chronic compression deformities as described.   Multiple old right rib fractures are seen.   CT of the lumbar spine: Acute L1 compression fracture with 30% height loss identified.   Changes of prior SI joint fixation and the right iliac bone fracture with healing are seen.     Electronically Signed   By: Inez Catalina M.D.   On: 10/02/2022 21:31    I have personally reviewed the images and agree with the above interpretation.  Assessment and Plan: Mr. Newborn is a pleasant 67 y.o. male has L1 compression fracture s/p fall on 10/01/22. He has constant mid to low back pain. Brace helps. No leg pain. No numbness, tingling, or weakness in his legs.   Xrays from today show progression of L1 fracture  from previous CT scan.   Treatment options discussed with patient and following plan made:   - We  discussed that fracture has progressed since his last imaging. He has no radicular leg pain or symptoms so will hold on further imaging.  - Continue with TLSO brace. No bending, twisting, or lifting.  - Recommend he continue on lyrica and oxycodone prn.  - Okay to continue with PT as long as he is wearing TLSO brace.  - Will follow up with me in 4 weeks with repeat xrays.   I spent a total of 20 minutes in face-to-face and non-face-to-face activities related to this patient's care today including review of outside records, review of imaging, review of symptoms, physical exam, discussion of differential diagnosis, discussion of treatment options, and documentation.   Thank you for involving me in the care of this patient.   Geronimo Boot PA-C Dept. of Neurosurgery

## 2022-11-03 ENCOUNTER — Ambulatory Visit
Admission: RE | Admit: 2022-11-03 | Discharge: 2022-11-03 | Disposition: A | Discharge status: Home / Self Care | Payer: No Typology Code available for payment source | Source: Ambulatory Visit | Attending: Orthopedic Surgery | Admitting: Orthopedic Surgery

## 2022-11-03 ENCOUNTER — Ambulatory Visit: Payer: Medicare Other | Admitting: Orthopedic Surgery

## 2022-11-03 ENCOUNTER — Ambulatory Visit
Admission: RE | Admit: 2022-11-03 | Discharge: 2022-11-03 | Disposition: A | Discharge status: Home / Self Care | Payer: No Typology Code available for payment source | Attending: Orthopedic Surgery | Admitting: Orthopedic Surgery

## 2022-11-03 ENCOUNTER — Encounter: Payer: Self-pay | Admitting: Orthopedic Surgery

## 2022-11-03 VITALS — BP 115/70 | Ht 74.0 in | Wt 222.0 lb

## 2022-11-03 DIAGNOSIS — W19XXXA Unspecified fall, initial encounter: Secondary | ICD-10-CM | POA: Diagnosis not present

## 2022-11-03 DIAGNOSIS — S32010A Wedge compression fracture of first lumbar vertebra, initial encounter for closed fracture: Secondary | ICD-10-CM

## 2022-11-03 NOTE — Patient Instructions (Signed)
It was so nice to see you today. Thank you so much for coming in.    Your lower back xrays showed a compression fracture at L1 (broken bone) and I think this is causing your pain.   Fracture has progressed since your last images. I still recommend treating conservatively. If you develop leg pain, numbness, tingling, or weakness then let me know.   Keep wearing brace. Can remove to sleep.   No bending, twisting, or lifting.   I will see you back in 4 weeks. Come at 12:30 for repeat xrays  Please do not hesitate to call if you have any questions or concerns. You can also message me in [facility].   Geronimo Boot PA-C [phone]

## 2022-11-06 IMAGING — CT CT CHEST LUNG CANCER SCREENING LOW DOSE W/O CM
2 of 5 series · 15 of 40 positions shown, 18 images · non-contrast
Comparison: 09/11/2021 chest CT.  02/03/2021 screening chest CT.

CLINICAL DATA: 65-year-old asymptomatic male former smoker with 35
pack-year smoking history, quit smoking 11 years prior.



[Series 4: lung 1.00 br44 cor · coronal · 0.73mm/px · 3 of 379 slices shown]
[im 76/379  lung]
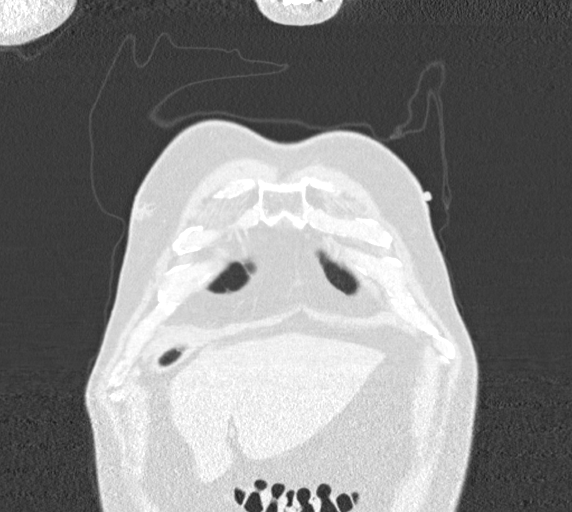
[im 152/379  lung]
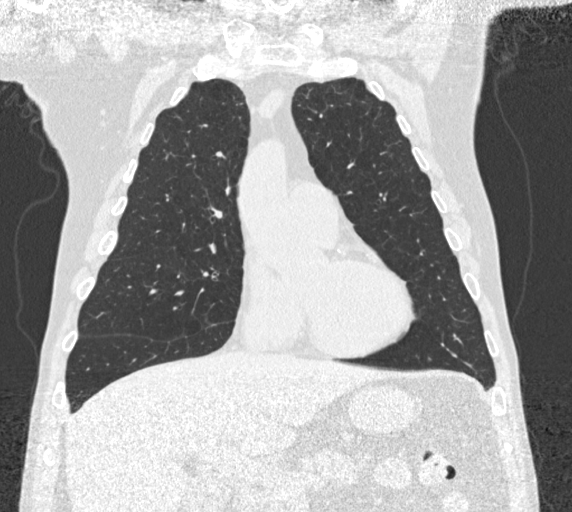
[im 227/379  lung]
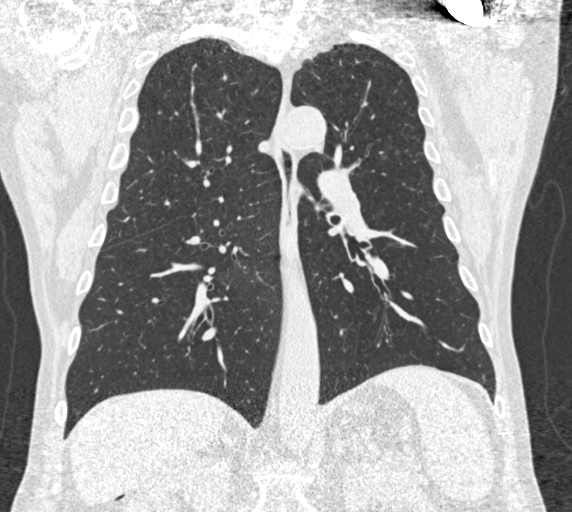

[Series 9: lung 1.00 br60 axial · axial · 0.80mm/px · z∈[-1299,-960]mm · 12 of 373 slices shown, 15 images]
[im 17/373  mediastinal]
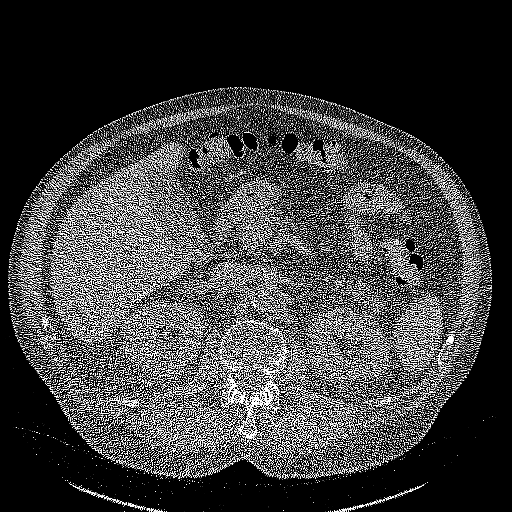
[im 17/373  lung]
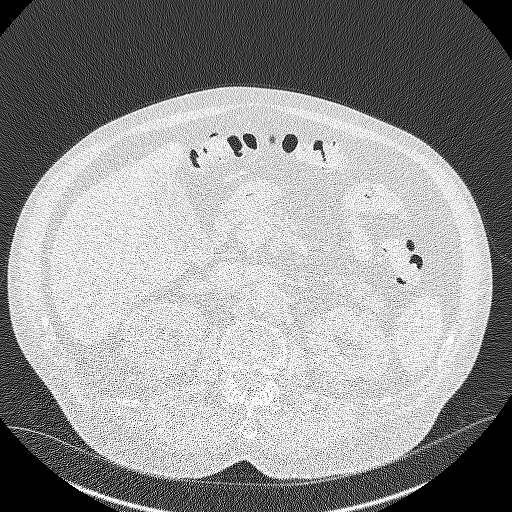
[im 51/373  lung]
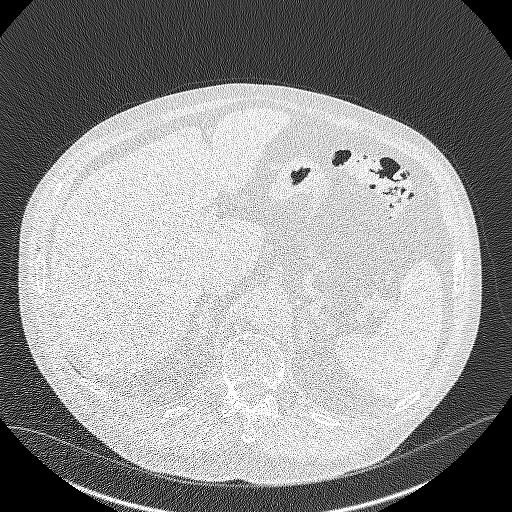
[im 85/373  lung]
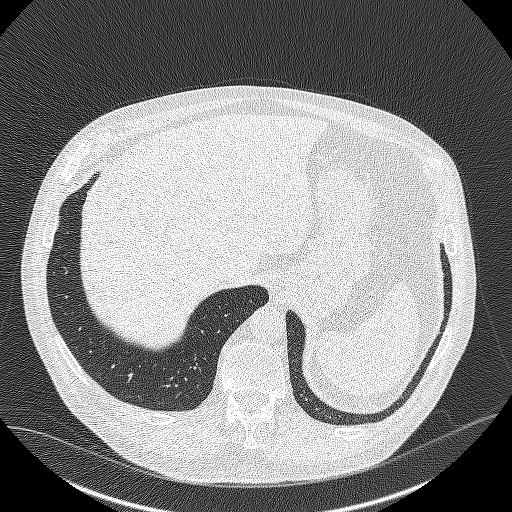
[im 119/373  lung]
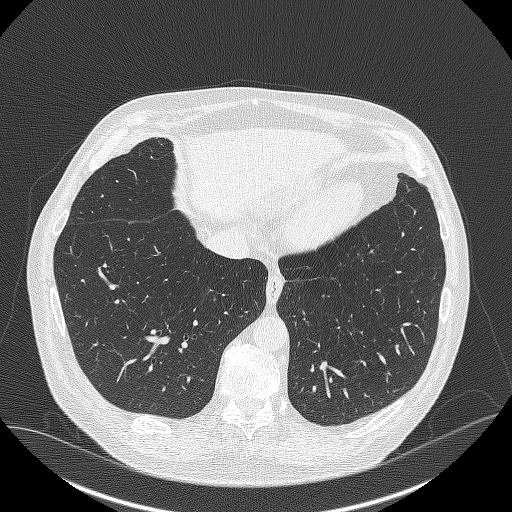
[im 136/373  mediastinal]
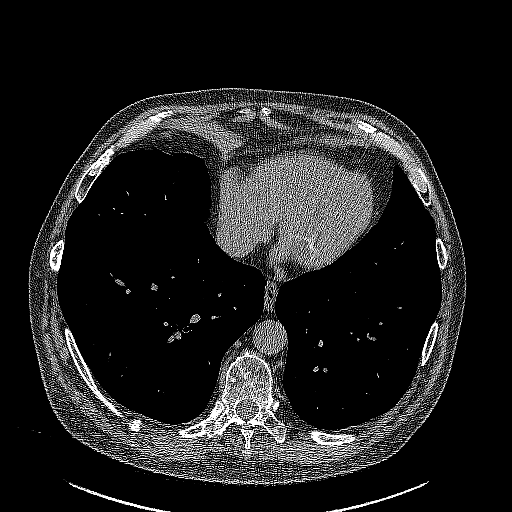
[im 136/373  lung]
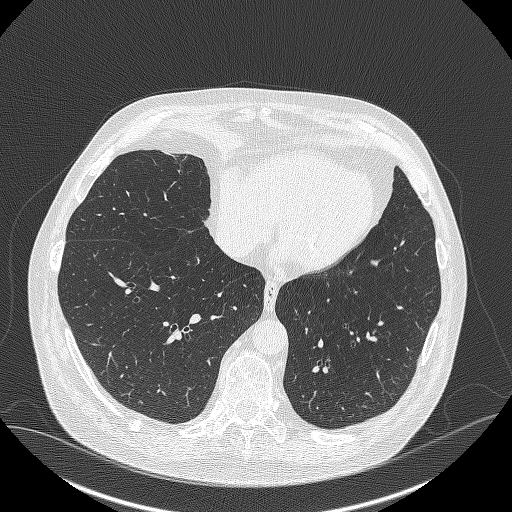
[im 170/373  lung]
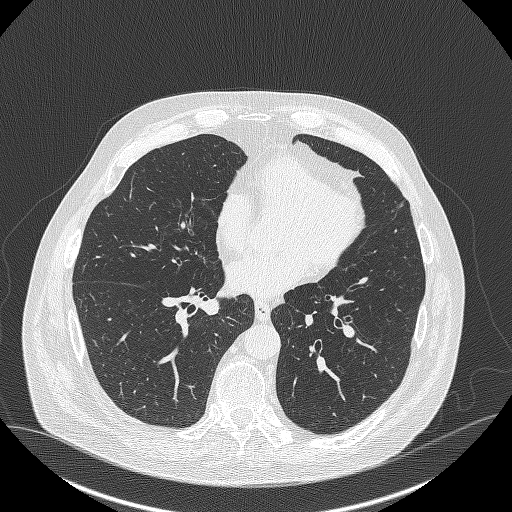
[im 203/373  lung]
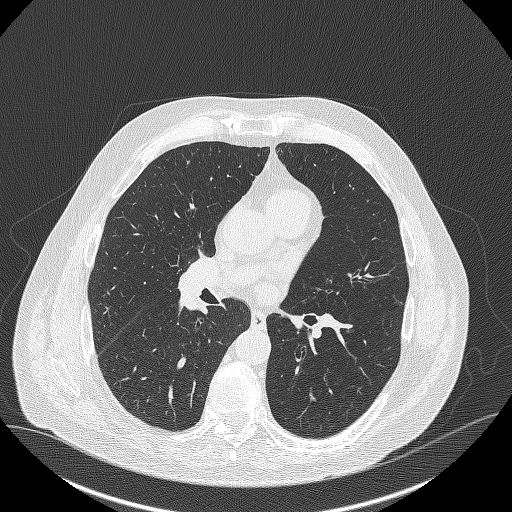
[im 237/373  lung]
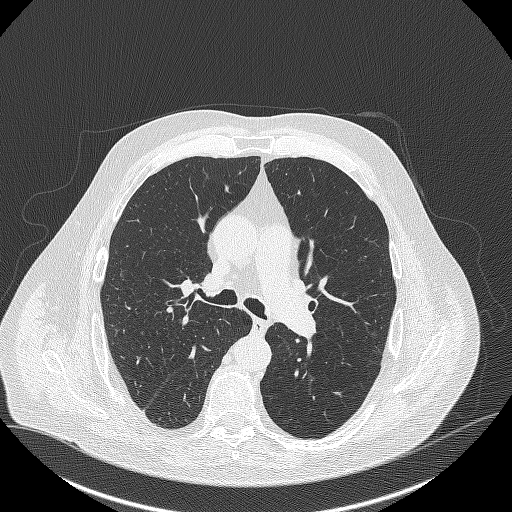
[im 254/373  mediastinal]
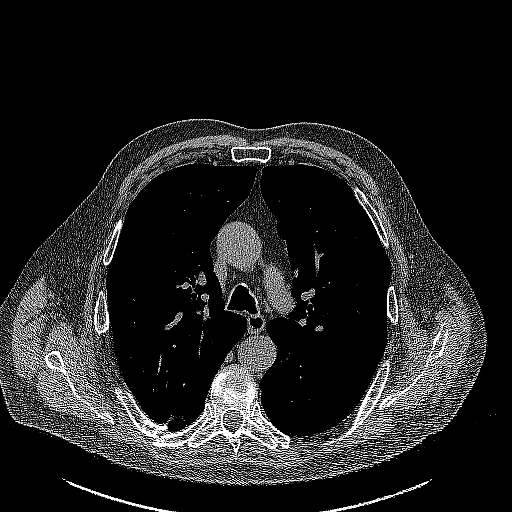
[im 254/373  lung]
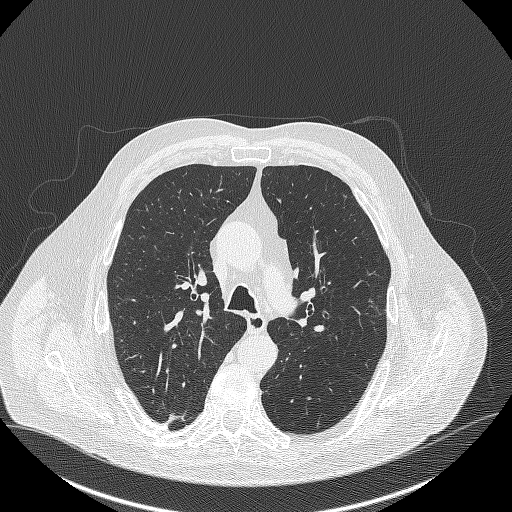
[im 288/373  lung]
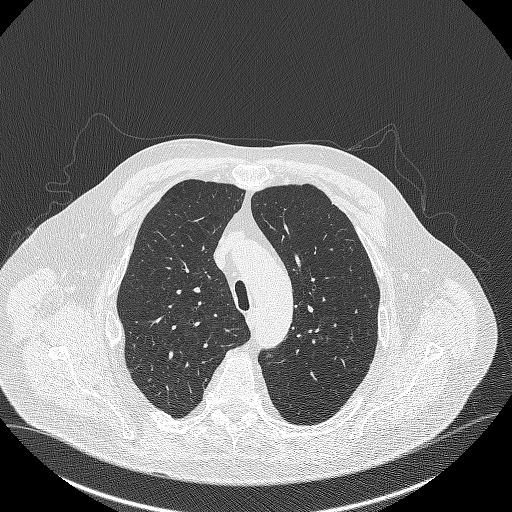
[im 322/373  lung]
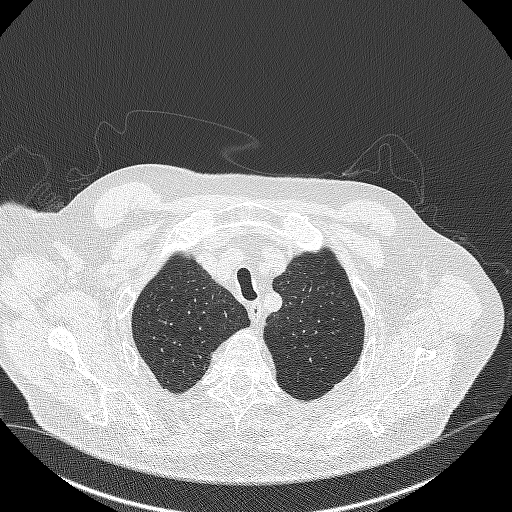
[im 356/373  lung]
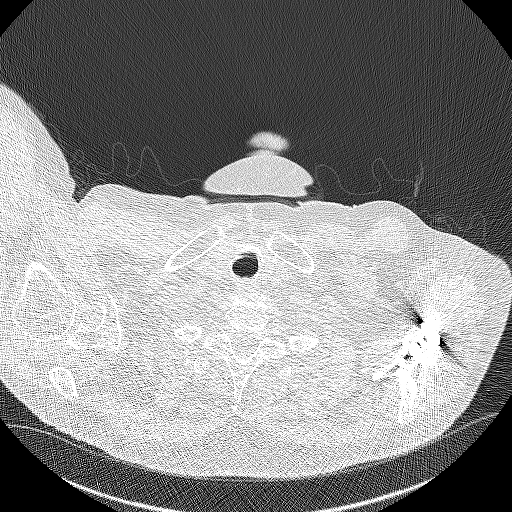

[15 of 40 positions shown; findings below may reference images not displayed]

FINDINGS: Cardiovascular: Normal heart size. No significant pericardial
effusion/thickening. Left anterior descending and left circumflex
coronary atherosclerosis. Mildly atherosclerotic nonaneurysmal
thoracic aorta. Normal caliber pulmonary arteries.

Mediastinum/Nodes: No discrete thyroid nodules. Unremarkable
esophagus. No pathologically enlarged axillary, mediastinal or hilar
lymph nodes, noting limited sensitivity for the detection of hilar
adenopathy on this noncontrast study.

Lungs/Pleura: No pneumothorax. No pleural effusion. Moderate
centrilobular emphysema with diffuse bronchial wall thickening. No
acute consolidative airspace disease or lung masses. No significant
pulmonary nodules.

Upper abdomen: Simple 4.1 cm upper right renal cyst, for which no
follow-up is recommended.

Musculoskeletal: No aggressive appearing focal osseous lesions.
Moderate thoracic spondylosis. Chronic mild to moderate T3 and T4
vertebral compression fractures. Left shoulder arthroplasty.
IMPRESSION: 1. Lung-RADS 1, negative. Continue annual screening with low-dose
chest CT without contrast in 12 months.
2. Two-vessel coronary atherosclerosis.
3. Aortic Atherosclerosis (A6H30-TGU.U) and Emphysema (A6H30-1JU.2).

## 2022-11-11 ENCOUNTER — Other Ambulatory Visit: Payer: Self-pay | Admitting: Adult Health

## 2022-11-11 ENCOUNTER — Non-Acute Institutional Stay (SKILLED_NURSING_FACILITY): Payer: Medicare Other | Admitting: Adult Health

## 2022-11-11 ENCOUNTER — Encounter: Payer: Self-pay | Admitting: Adult Health

## 2022-11-11 DIAGNOSIS — I10 Essential (primary) hypertension: Secondary | ICD-10-CM

## 2022-11-11 DIAGNOSIS — J439 Emphysema, unspecified: Secondary | ICD-10-CM

## 2022-11-11 DIAGNOSIS — F1090 Alcohol use, unspecified, uncomplicated: Secondary | ICD-10-CM | POA: Diagnosis not present

## 2022-11-11 DIAGNOSIS — J4489 Other specified chronic obstructive pulmonary disease: Secondary | ICD-10-CM | POA: Diagnosis not present

## 2022-11-11 DIAGNOSIS — S32018S Other fracture of first lumbar vertebra, sequela: Secondary | ICD-10-CM | POA: Diagnosis not present

## 2022-11-11 DIAGNOSIS — D649 Anemia, unspecified: Secondary | ICD-10-CM

## 2022-11-11 DIAGNOSIS — S2241XA Multiple fractures of ribs, right side, initial encounter for closed fracture: Secondary | ICD-10-CM

## 2022-11-11 DIAGNOSIS — S32010A Wedge compression fracture of first lumbar vertebra, initial encounter for closed fracture: Secondary | ICD-10-CM

## 2022-11-11 MED ORDER — OXYCODONE HCL 5 MG PO TABS: 10.0000 mg | ORAL_TABLET | ORAL | 0 refills | Status: DC | PRN

## 2022-11-11 NOTE — Progress Notes (Unsigned)
Location:  Other Nursing Home Room Number: 105A Place of Service:  SNF (31)  Provider: Durenda Age, NP   PCP: Macarthur Critchley, MD Patient Care Team: Macarthur Critchley, MD as PCP - General (Family Medicine) Deneise Lever, MD as Consulting Physician (Pulmonary Disease) Zadie Rhine Clent Demark, MD as Consulting Physician (Ophthalmology)  Extended Emergency Contact Information Primary Emergency Contact: [place] Mobile Phone: [phone] Relation: Daughter Secondary Emergency Contact: [place] Work Phone: [phone] Relation: Significant other  Code Status: Full code Goals of care:  Advanced Directive information    11/11/2022   10:48 AM  Advanced Directives  Does Patient Have a Medical Advance Directive? No  Would patient like information on creating a medical advance directive? No - Patient declined     No Known Allergies  Chief Complaint  Patient presents with   Acute Visit    For discharge home on  11/14/22    HPI:  67 y.o. male      Past Medical History:  Diagnosis Date   COPD (chronic obstructive pulmonary disease) ([place])    COPD (chronic obstructive pulmonary disease) ([place])    GERD (gastroesophageal reflux disease)    History of hepatitis C    treated several years ago through [facility]   Hyperlipidemia    Hypertension    OSA (obstructive sleep apnea)     Past Surgical History:  Procedure Laterality Date   CATARACT EXTRACTION, BILATERAL     EYE SURGERY Bilateral    IR THORACENTESIS ASP PLEURAL SPACE W/IMG GUIDE  09/14/2021   IR THORACENTESIS ASP PLEURAL SPACE W/IMG GUIDE  09/21/2021   ORIF PELVIC FRACTURE WITH PERCUTANEOUS SCREWS Right 07/29/2021   Procedure: SI SCREW FIXATION OF RIGHT PELVIC RING;  Surgeon: Altamese Langston, MD;  Location: [place];  Service: Orthopedics;  Laterality: Right;   ORTHOPEDIC SURGERY Bilateral    pt states fracture repairs to arms and legs.   REVERSE SHOULDER ARTHROPLASTY Left 09/23/2019    Procedure: REVERSE SHOULDER ARTHROPLASTY;  Surgeon: Tania Ade, MD;  Location: [facility];  Service: Orthopedics;  Laterality: Left;   TRACHEOSTOMY TUBE PLACEMENT N/A 08/10/2021   Procedure: TRACHEOSTOMY;  Surgeon: Georganna Skeans, MD;  Location: [place];  Service: General;  Laterality: N/A;      reports that he quit smoking about 12 years ago. His smoking use included cigarettes. He started smoking about 52 years ago. He has a 35.00 pack-year smoking history. He has never used smokeless tobacco. He reports current alcohol use of about 42.0 standard drinks of alcohol per week. He reports that he does not use drugs. Social History   Socioeconomic History   Marital status: Single    Spouse name: Not on file   Number of children: 2   Years of education: Not on file   Highest education level: Not on file  Occupational History   Occupation: Architect  Tobacco Use   Smoking status: Former    Packs/day: 1.00    Years: 35.00    Total pack years: 35.00    Types: Cigarettes    Start date: 2    Quit date: 10/01/2010    Years since quitting: 12.1   Smokeless tobacco: Never  Vaping Use   Vaping Use: Never used  Substance and Sexual Activity   Alcohol use: Yes    Alcohol/week: 42.0 standard drinks of alcohol    Types: 42 Cans of beer per week    Comment: daily - 6 drinks a day   Drug use:  Never    Comment: last marijuana 2-3 weeks ago   Sexual activity: Not Currently  Other Topics Concern   Not on file  Social History Narrative   Not on file   Social Determinants of Health   Financial Resource Strain: Not on file  Food Insecurity: No Food Insecurity (10/03/2022)   Hunger Vital Sign    Worried About Running Out of Food in the Last Year: Never true    Ran Out of Food in the Last Year: Never true  Transportation Needs: No Transportation Needs (10/03/2022)   PRAPARE - Hydrologist (Medical): No    Lack of Transportation (Non-Medical): No  Physical  Activity: Not on file  Stress: Not on file  Social Connections: Not on file  Intimate Partner Violence: Not At Risk (10/03/2022)   Humiliation, Afraid, Rape, and Kick questionnaire    Fear of Current or Ex-Partner: No    Emotionally Abused: No    Physically Abused: No    Sexually Abused: No   Functional Status Survey:    No Known Allergies  Pertinent  Health Maintenance Due  Topic Date Due   COLONOSCOPY (Pts 45-4yr Insurance coverage will need to be confirmed)  09/20/2027   INFLUENZA VACCINE  Completed    Medications: Outpatient Encounter Medications as of 11/11/2022  Medication Sig   acetaminophen (TYLENOL) 325 MG tablet Take 650 mg by mouth every 8 (eight) hours as needed.   albuterol (PROVENTIL) (2.5 MG/3ML) 0.083% nebulizer solution Take 2.5 mg by nebulization every 6 (six) hours as needed for wheezing or shortness of breath.   albuterol (VENTOLIN HFA) 108 (90 Base) MCG/ACT inhaler Inhale 2 puffs into the lungs every 4 (four) hours as needed for wheezing or shortness of breath.   aspirin EC 81 MG tablet Take 81 mg by mouth daily.   atorvastatin (LIPITOR) 40 MG tablet TAKE 1 TABLET BY MOUTH ONCE DAILY   Cholecalciferol (VITAMIN D3) 25 MCG (1000 UT) CAPS Give 2 by mouth once daily.   cyclobenzaprine (FLEXERIL) 5 MG tablet Take 5 mg by mouth every 8 (eight) hours as needed for muscle spasms.   fluocinonide (LIDEX) 0.05 % external solution Apply 1 Application topically 2 (two) times daily as needed (skin irritation).   Fluticasone-Umeclidin-Vilant (TRELEGY ELLIPTA) 100-62.5-25 MCG/ACT AEPB INHALE 1 PUFF INTO THE LUNGS EVERY DAY AT THE SAME TIME EACH DAY   folic acid (FOLVITE) 1 MG tablet Take 1 tablet (1 mg total) by mouth daily.   ketoconazole (NIZORAL) 2 % cream Apply 1 Application topically 2 (two) times daily as needed for irritation.   ketoconazole (NIZORAL) 2 % shampoo Apply 1 Application topically 2 (two) times a week.   Lactulose 20 GM/30ML SOLN Take 15 mLs (10 g total) by  mouth daily.   lidocaine (LIDODERM) 5 % Place 1 patch onto the skin at bedtime. Remove & Discard patch within 12 hours or as directed by MD   lisinopril (ZESTRIL) 10 MG tablet Take 1 tablet (10 mg total) by mouth daily.   metoprolol succinate (TOPROL-XL) 25 MG 24 hr tablet Take 25 mg by mouth daily.   Multiple Vitamins-Minerals (MULTI FOR HIM 50+ PO) Take 1 tablet by mouth daily.   oxyCODONE (OXY IR/ROXICODONE) 5 MG immediate release tablet Take 2 tablets (10 mg total) by mouth every 4 (four) hours as needed for severe pain. Pain scale 0-5   OXYGEN Inhale 3 L into the lungs at bedtime.   pantoprazole sodium (PROTONIX) 40 mg Take 40  mg by mouth daily.   polyethylene glycol (MIRALAX / GLYCOLAX) 17 g packet Take 17 g by mouth daily as needed for moderate constipation.   pregabalin (LYRICA) 50 MG capsule Take 1 capsule (50 mg total) by mouth 2 (two) times daily.   roflumilast (DALIRESP) 500 MCG TABS tablet Take 500 mcg by mouth daily.   senna-docusate (SENOKOT-S) 8.6-50 MG tablet Take 1 tablet by mouth at bedtime.   tamsulosin (FLOMAX) 0.4 MG CAPS capsule Take 0.4 mg by mouth daily.   theophylline (THEO-24) 200 MG 24 hr capsule Take 200 mg by mouth 2 (two) times daily.   thiamine (VITAMIN B-1) 100 MG tablet Take 1 tablet (100 mg total) by mouth daily.   No facility-administered encounter medications on file as of 11/11/2022.    Review of Systems  Vitals:   11/11/22 1037  BP: 121/77  Pulse: 99  Resp: 20  Temp: (!) 97.4 F (36.3 C)  SpO2: 92%  Weight: 221 lb (100.2 kg)  Height: '6\' 2"'$  (1.88 m)   Body mass index is 28.37 kg/m. Physical Exam  Labs reviewed: Basic Metabolic Panel: Recent Labs    10/12/22 0322 10/13/22 0201 10/14/22 0201  NA 136 133* 135  K 3.2* 3.6 3.7  CL 106 104 106  CO2 23 22 21*  GLUCOSE 110* 108* 104*  BUN 13 9 7*  CREATININE 0.70 0.69 0.68  CALCIUM 8.9 8.6* 9.0  MG 1.8 2.0 2.0  PHOS  --  2.8  --    Liver Function Tests: Recent Labs    10/09/22 0356  10/10/22 0053 10/13/22 0201  AST 37 29 32  ALT 38 39 36  ALKPHOS 98 96 98  BILITOT 0.9 0.9 0.5  PROT 7.4 7.0 6.5  ALBUMIN 3.9 3.6 3.2*   No results for input(s): "LIPASE", "AMYLASE" in the last 8760 hours. Recent Labs    10/09/22 0703  AMMONIA 25   CBC: Recent Labs    10/12/22 0322 10/13/22 0201 10/14/22 0201  WBC 7.6 6.8 6.6  NEUTROABS 5.7 4.7 4.5  HGB 13.4 12.7* 13.2  HCT 37.7* 35.9* 38.0*  MCV 96.2 96.2 96.9  PLT 256 254 245   Cardiac Enzymes: Recent Labs    10/02/22 1807  CKTOTAL 171   BNP: Invalid input(s): "POCBNP" CBG: No results for input(s): "GLUCAP" in the last 8760 hours.  Procedures and Imaging Studies During Stay: DG Lumbar Spine 2-3 Views  Result Date: 11/06/2022 CLINICAL DATA:  History of L1 compression fracture EXAM: LUMBAR SPINE - 2-3 VIEW COMPARISON:  CT C AP 10/02/2022 FINDINGS: Screw fixation of the SI joints. Lower lumbar spine degenerative disc disease. Interval progression of height loss of the L1 vertebral body, now nearly completely collapsed. Mild height loss of the superior L2 vertebral body. Multilevel degenerative facet changes. IMPRESSION: Interval progression of height loss of the L1 vertebral body, now nearly completely collapsed. Mild height loss of the superior L2 vertebral body. Electronically Signed   By: Lovey Newcomer M.D.   On: 11/06/2022 20:30    Assessment/Plan:   1. Traumatic fracture of ribs of right side with pneumothorax ***  2. Anemia, unspecified type ***    Patient is being discharged with the following home health services:    Patient is being discharged with the following durable medical equipment:    Patient has been advised to f/u with their PCP in 1-2 weeks to for a transitions of care visit.  Social services at their facility was responsible for arranging this appointment.  Pt  was provided with adequate prescriptions of noncontrolled medications to reach the scheduled appointment .  For controlled substances,  a limited supply was provided as appropriate for the individual patient.  If the pt normally receives these medications from a pain clinic or has a contract with another physician, these medications should be received from that clinic or physician only).    Future labs/tests needed:  ***

## 2022-11-17 ENCOUNTER — Telehealth: Payer: Self-pay

## 2022-11-17 DIAGNOSIS — S32010A Wedge compression fracture of first lumbar vertebra, initial encounter for closed fracture: Secondary | ICD-10-CM

## 2022-11-17 NOTE — Telephone Encounter (Signed)
[facility] [address]  [employer]

## 2022-11-17 NOTE — Telephone Encounter (Signed)
Patient requesting a refill on Oxycodone 10 mg. Please call patient.

## 2022-11-17 NOTE — Telephone Encounter (Signed)
L1 compression fracture s/p fall on 10/01/22  PMP reviewed and is appropriate.   Recommend he cut down to oxycodone '5mg'$  only 1 every 4 hours as needed for severe pain.   [facility] does not accept electronic prescriptions. Please find out what pharmacy he wants it sent to.   I can do tomorrow or send to [facility] if needs done today.

## 2022-11-18 MED ORDER — OXYCODONE HCL 5 MG PO TABS: 5.0000 mg | ORAL_TABLET | ORAL | 0 refills | Status: DC | PRN

## 2022-11-18 NOTE — Telephone Encounter (Signed)
[facility] let [facility]   Note change in directions to 1 every 4 hours.   Last script not seen in PMP.

## 2022-11-18 NOTE — Addendum Note (Signed)
Addended byGeronimo Boot on: 11/18/2022 08:36 AM   Modules accepted: Orders

## 2022-11-18 NOTE — Telephone Encounter (Signed)
Patient is aware of medication refill

## 2022-11-21 ENCOUNTER — Telehealth: Payer: Self-pay

## 2022-11-21 DIAGNOSIS — S32010A Wedge compression fracture of first lumbar vertebra, initial encounter for closed fracture: Secondary | ICD-10-CM

## 2022-11-21 NOTE — Telephone Encounter (Signed)
-----   Message from Peggyann Shoals sent at 11/21/2022 11:11 AM EST ----- Regarding: med change Contact: [phone] Oxycodone '5mg'$  every 4 hours is not touching his pain. Would you consider going back to '10mg'$ , that did help keep the pain under control. [facility]

## 2022-11-21 NOTE — Telephone Encounter (Signed)
I would prefer keeping the oxycodone at 1 every 4 hours since he is further out from fracture.   Is he still taking robaxin? Does it help? Did flexeril help more?   Can also considering adding an anti-inflammatory medication. Looks like he's been on motrin previously. Did that help? Has he ever taken meloxicam?

## 2022-11-22 MED ORDER — MELOXICAM 15 MG PO TABS: 15.0000 mg | ORAL_TABLET | Freq: Every day | ORAL | 1 refills | Status: DC

## 2022-11-22 NOTE — Telephone Encounter (Signed)
Patient notified

## 2022-11-22 NOTE — Telephone Encounter (Signed)
Sent mobic to his pharmacy. Please let him know.   He should not take any over the counter motrin when he is on the mobic.

## 2022-11-22 NOTE — Telephone Encounter (Signed)
I spoke with the patient and he stated that he takes Flexeril and was not sure that he has ever taken robaxin. He stated that he is taking the flexeril every 8 hours. He would also like to try the meloxicam, he doesn't recall every being on this before.

## 2022-11-22 NOTE — Addendum Note (Signed)
Addended byGeronimo Boot on: 11/22/2022 01:09 PM   Modules accepted: Orders

## 2022-11-29 ENCOUNTER — Other Ambulatory Visit: Payer: Self-pay | Admitting: Orthopedic Surgery

## 2022-11-29 DIAGNOSIS — S32010D Wedge compression fracture of first lumbar vertebra, subsequent encounter for fracture with routine healing: Secondary | ICD-10-CM

## 2022-11-29 NOTE — Progress Notes (Unsigned)
Referring Physician:  Macarthur Critchley, MD [address] [place],  [place] [address]  Primary Physician:  Macarthur Critchley, MD  History of Present Illness: 11/29/2022 [name] has a history of HTN, hyperlipidemia, COPD, hep C, alcohol use disorder (drinks 4-20 beers per day), DM, macular degeneration, GERD, CAD, OSA, and anxiety.   History of chronic thoracic compression fractures, chronic pubic rami fractures, right iliac bone fracture, and multiple old right rib fractures.   Last seen by me on 11/03/22 for L1 compression fracture s/p fall on 10/01/22. Xrays showed some progression of fracture from previous CT. We discussed and he wanted to continue with conservative treatment.   He was to continue with TLSO brace and avoid bending, twisting, or lifting.   He is here for follow up.   He noted increased pain starting yesterday. No injury noted. He continues with constant mid to lower back pain. No leg pain. No numbness, tingling, or weakness in his legs. Overall, he continues with persistent pain in his lower back that is affecting his daily activities.   He is taking lyrica, oxycodone, mobic, and flexeril.   History of pelvic fracture with fixation.   No bowel or bladder issues.   Conservative measures:  Physical therapy: did PT at SNF. Is back home.  Multimodal medical therapy including regular antiinflammatories: flexeril, motrin, lidoderm patches,  lyrica Injections: No epidural steroid injections  Past Surgery: no spinal surgery, previous pelvic fixation 2 years ago   Review of Systems:  A 10 point review of systems is negative, except for the pertinent positives and negatives detailed in the HPI.  Past Medical History: Past Medical History:  Diagnosis Date   COPD (chronic obstructive pulmonary disease) ([facility])    COPD (chronic obstructive pulmonary disease) ([facility])    GERD (gastroesophageal reflux disease)    History of hepatitis C    treated several years ago through  [facility]   Hyperlipidemia    Hypertension    OSA (obstructive sleep apnea)     Past Surgical History: Past Surgical History:  Procedure Laterality Date   CATARACT EXTRACTION, BILATERAL     EYE SURGERY Bilateral    IR THORACENTESIS ASP PLEURAL SPACE W/IMG GUIDE  09/14/2021   IR THORACENTESIS ASP PLEURAL SPACE W/IMG GUIDE  09/21/2021   ORIF PELVIC FRACTURE WITH PERCUTANEOUS SCREWS Right 07/29/2021   Procedure: SI SCREW FIXATION OF RIGHT PELVIC RING;  Surgeon: Altamese Power, MD;  Location: [place];  Service: Orthopedics;  Laterality: Right;   ORTHOPEDIC SURGERY Bilateral    pt states fracture repairs to arms and legs.   REVERSE SHOULDER ARTHROPLASTY Left 09/23/2019   Procedure: REVERSE SHOULDER ARTHROPLASTY;  Surgeon: Tania Ade, MD;  Location: [facility];  Service: Orthopedics;  Laterality: Left;   TRACHEOSTOMY TUBE PLACEMENT N/A 08/10/2021   Procedure: TRACHEOSTOMY;  Surgeon: Georganna Skeans, MD;  Location: [facility];  Service: General;  Laterality: N/A;    Allergies: Allergies as of 12/01/2022   (No Known Allergies)    Medications: Outpatient Encounter Medications as of 12/01/2022  Medication Sig   acetaminophen (TYLENOL) 325 MG tablet Take 650 mg by mouth every 8 (eight) hours as needed.   albuterol (PROVENTIL) (2.5 MG/3ML) 0.083% nebulizer solution Take 2.5 mg by nebulization every 6 (six) hours as needed for wheezing or shortness of breath.   albuterol (VENTOLIN HFA) 108 (90 Base) MCG/ACT inhaler Inhale 2 puffs into the lungs every 4 (four) hours as needed for wheezing or shortness of breath.   aspirin EC  81 MG tablet Take 81 mg by mouth daily.   atorvastatin (LIPITOR) 40 MG tablet TAKE 1 TABLET BY MOUTH ONCE DAILY   Cholecalciferol (VITAMIN D3) 25 MCG (1000 UT) CAPS Give 2 by mouth once daily.   cyclobenzaprine (FLEXERIL) 5 MG tablet Take 5 mg by mouth every 8 (eight) hours as needed for muscle spasms.   fluocinonide (LIDEX) 0.05 % external solution Apply 1 Application topically 2  (two) times daily as needed (skin irritation).   Fluticasone-Umeclidin-Vilant (TRELEGY ELLIPTA) 100-62.5-25 MCG/ACT AEPB INHALE 1 PUFF INTO THE LUNGS EVERY DAY AT THE SAME TIME EACH DAY   folic acid (FOLVITE) 1 MG tablet Take 1 tablet (1 mg total) by mouth daily.   ketoconazole (NIZORAL) 2 % cream Apply 1 Application topically 2 (two) times daily as needed for irritation.   ketoconazole (NIZORAL) 2 % shampoo Apply 1 Application topically 2 (two) times a week.   Lactulose 20 GM/30ML SOLN Take 15 mLs (10 g total) by mouth daily.   lidocaine (LIDODERM) 5 % Place 1 patch onto the skin at bedtime. Remove & Discard patch within 12 hours or as directed by MD   lisinopril (ZESTRIL) 10 MG tablet Take 1 tablet (10 mg total) by mouth daily.   meloxicam (MOBIC) 15 MG tablet Take 1 tablet (15 mg total) by mouth daily. Take with food.   metoprolol succinate (TOPROL-XL) 25 MG 24 hr tablet Take 25 mg by mouth daily.   Multiple Vitamins-Minerals (MULTI FOR HIM 50+ PO) Take 1 tablet by mouth daily.   oxyCODONE (OXY IR/ROXICODONE) 5 MG immediate release tablet Take 1 tablet (5 mg total) by mouth every 4 (four) hours as needed for severe pain. Pain scale 0-5   OXYGEN Inhale 3 L into the lungs at bedtime.   pantoprazole sodium (PROTONIX) 40 mg Take 40 mg by mouth daily.   polyethylene glycol (MIRALAX / GLYCOLAX) 17 g packet Take 17 g by mouth daily as needed for moderate constipation.   pregabalin (LYRICA) 50 MG capsule Take 1 capsule (50 mg total) by mouth 2 (two) times daily.   roflumilast (DALIRESP) 500 MCG TABS tablet Take 500 mcg by mouth daily.   senna-docusate (SENOKOT-S) 8.6-50 MG tablet Take 1 tablet by mouth at bedtime.   tamsulosin (FLOMAX) 0.4 MG CAPS capsule Take 0.4 mg by mouth daily.   theophylline (THEO-24) 200 MG 24 hr capsule Take 200 mg by mouth 2 (two) times daily.   thiamine (VITAMIN B-1) 100 MG tablet Take 1 tablet (100 mg total) by mouth daily.   No facility-administered encounter medications  on file as of 12/01/2022.    Social History: Social History   Tobacco Use   Smoking status: Former    Packs/day: 1.00    Years: 35.00    Total pack years: 35.00    Types: Cigarettes    Start date: 46    Quit date: 10/01/2010    Years since quitting: 12.1   Smokeless tobacco: Never  Vaping Use   Vaping Use: Never used  Substance Use Topics   Alcohol use: Yes    Alcohol/week: 42.0 standard drinks of alcohol    Types: 42 Cans of beer per week    Comment: daily - 6 drinks a day   Drug use: Never    Comment: last marijuana 2-3 weeks ago    Family Medical History: Family History  Problem Relation Age of Onset   Heart disease Mother    Hypertension Father     Physical Examination: There were  no vitals filed for this visit.    Awake, alert, oriented to person, place, and time.  Speech is clear and fluent. Fund of knowledge is appropriate.   Cranial Nerves: Pupils equal round and reactive to light.  Facial tone is symmetric.    He is wearing TLSO brace. He has tenderness over L1 area.  ROM of lumbar spine not tested.   No abnormal lesions on exposed skin.   Strength:  Side Iliopsoas Quads Hamstring PF DF EHL  R '5 5 5 5 5 5  '$ L '5 5 5 5 5 5   '$ Clonus is not present.   Bilateral upper and lower extremity sensation is intact to light touch.     Slow gait with a cane.   Medical Decision Making  Imaging: Lumbar xrays dated 12/01/22:  L1 compression fracture that is stable from previous xrays.   Radiology report not yet available for above xrays.   Assessment and Plan: [name] is a pleasant 67 y.o. male who has L1 compression fracture s/p fall on 10/01/22. He has constant mid to low back pain that is getting worse. No leg pain.   Xrays from today show L1 fracture is stable from previous xrays.   Treatment options discussed with patient and following plan made:   - Due to increasing pain in his lower back, I recommend MRI of lumbar spine to further evaluate L1  compression fracture.  - He was a Building control surveyor. Will need orbits to clear him for MRI. These were ordered.  - Depending on results of MRI, may consider referral to IR for kyphoplasty.  - Continue with TLSO brace. No bending, twisting, or lifting.  - Recommend he continue on lyrica and oxycodone prn.  - Given refill of oxycodone. Directions changed to q 6 hours prn. Reviewed dosing and side effects. PMP reviewed and is appropriate.  - Will do phone visit to review MRI results once I have them back.   I spent a total of 20 minutes in face-to-face and non-face-to-face activities related to this patient's care today including review of outside records, review of imaging, review of symptoms, physical exam, discussion of differential diagnosis, discussion of treatment options, and documentation.   Geronimo Boot PA-C Dept. of Neurosurgery

## 2022-12-01 ENCOUNTER — Ambulatory Visit
Admission: RE | Admit: 2022-12-01 | Discharge: 2022-12-01 | Disposition: A | Discharge status: Home / Self Care | Payer: Medicare Other | Attending: Orthopedic Surgery | Admitting: Orthopedic Surgery

## 2022-12-01 ENCOUNTER — Ambulatory Visit
Admission: RE | Admit: 2022-12-01 | Discharge: 2022-12-01 | Disposition: A | Discharge status: Home / Self Care | Payer: Medicare Other | Source: Ambulatory Visit | Attending: Orthopedic Surgery | Admitting: Orthopedic Surgery

## 2022-12-01 ENCOUNTER — Encounter: Payer: Self-pay | Admitting: Orthopedic Surgery

## 2022-12-01 ENCOUNTER — Ambulatory Visit: Payer: Medicare Other | Admitting: Orthopedic Surgery

## 2022-12-01 DIAGNOSIS — S32010D Wedge compression fracture of first lumbar vertebra, subsequent encounter for fracture with routine healing: Secondary | ICD-10-CM

## 2022-12-01 DIAGNOSIS — S32010A Wedge compression fracture of first lumbar vertebra, initial encounter for closed fracture: Secondary | ICD-10-CM

## 2022-12-01 MED ORDER — OXYCODONE HCL 5 MG PO TABS: 5.0000 mg | ORAL_TABLET | Freq: Four times a day (QID) | ORAL | 0 refills | Status: DC | PRN

## 2022-12-01 NOTE — Patient Instructions (Signed)
It was so nice to see you today. Thank you so much for coming in.    I want to get an MRI of your lower back to look into things further. We will get this approved through your insurance and [facility] will call you to schedule the appointment.   Prior to the MRI, we need to get xrays of your eyes to be sure there are no metal fragments. Please get these at [place] ([facility] with the [facility]). You do not need any appointment.   Continue to wear brace. No bending, twisting, or lifting.   Once I have MRI results, we will call to schedule a phone visit to review them.   I sent a refill of the oxycodone to your pharmacy. Continue to only take as needed for severe pain. Remember, this can make you sleepy and/or constipated.   Please do not hesitate to call if you have any questions or concerns. You can also message me in [place].   Geronimo Boot PA-C [phone]

## 2022-12-02 ENCOUNTER — Ambulatory Visit
Admission: RE | Admit: 2022-12-02 | Discharge: 2022-12-02 | Disposition: A | Discharge status: Home / Self Care | Payer: Medicare Other | Source: Ambulatory Visit | Attending: Orthopedic Surgery | Admitting: Orthopedic Surgery

## 2022-12-02 ENCOUNTER — Ambulatory Visit
Admission: RE | Admit: 2022-12-02 | Discharge: 2022-12-02 | Disposition: A | Discharge status: Home / Self Care | Payer: Medicare Other | Attending: Orthopedic Surgery | Admitting: Orthopedic Surgery

## 2022-12-02 DIAGNOSIS — S32010A Wedge compression fracture of first lumbar vertebra, initial encounter for closed fracture: Secondary | ICD-10-CM | POA: Diagnosis present

## 2022-12-05 ENCOUNTER — Other Ambulatory Visit: Payer: Self-pay | Admitting: Orthopedic Surgery

## 2022-12-05 DIAGNOSIS — S32010A Wedge compression fracture of first lumbar vertebra, initial encounter for closed fracture: Secondary | ICD-10-CM

## 2022-12-05 NOTE — Telephone Encounter (Signed)
Patient notified that rx was declined. He said that he would check on it.

## 2022-12-05 NOTE — Telephone Encounter (Signed)
Got a request from pharmacy for mobic refill. This was just filled on 3/5.   I denied the request. Please call and make sure he has prescription from 3/5.   Thanks.

## 2022-12-07 ENCOUNTER — Telehealth: Payer: Self-pay | Admitting: Orthopedic Surgery

## 2022-12-07 DIAGNOSIS — S32010A Wedge compression fracture of first lumbar vertebra, initial encounter for closed fracture: Secondary | ICD-10-CM

## 2022-12-07 MED ORDER — METHOCARBAMOL 500 MG PO TABS: 500.0000 mg | ORAL_TABLET | Freq: Three times a day (TID) | ORAL | 0 refills | Status: DC | PRN

## 2022-12-07 NOTE — Telephone Encounter (Signed)
His xrays did not show any metal foreign bodies.   I ordered the MRI when I saw him, so he should be contacted soon.   Is he still taking the flexeril? If he is and it's not helping, then I would recommend trying a different muscle relaxer.   Let me know if he wants me to call in robaxin to replace the flexeril.   Thanks.

## 2022-12-07 NOTE — Telephone Encounter (Signed)
Pt also wanted to know the results from his xrays on his eyes. He knows that was going to determine whether or not he could get his MRI.

## 2022-12-07 NOTE — Telephone Encounter (Signed)
I spoke with Mr [name]. I informed him that his xray did not show any metal foreign bodies. I provided him with the number to [facility] radiology scheduling so he can schedule his MRI at his convenience, rather than having to wait on scheduling to call him.   He states he is still taking the flexeril (and everything else that Marzetta Board gave him). He is interested in trying a different muscle relaxer. I advised him to stop the flexeril. He would like the new rx sent to [facility]. I informed him that we will send that in today.

## 2022-12-07 NOTE — Telephone Encounter (Signed)
Robaxin sent to pharmacy. Flexeril stopped.

## 2022-12-07 NOTE — Telephone Encounter (Signed)
Pt called in today stating he is having a lot of pain in his lower back. Says he is taking oxycodone as prescribed but it does not seem to be easing his pain at all. Having difficulty getting out of bed due to the pain.   CB: [phone]

## 2022-12-07 NOTE — Addendum Note (Signed)
Addended byGeronimo Boot on: 12/07/2022 12:35 PM   Modules accepted: Orders

## 2022-12-10 NOTE — Progress Notes (Unsigned)
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 Despite repetitive coaching he did not generate acceptable curves.  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 After 2 laps sat 88%, HR 118. With POC RA 87%, on 2L pulse sat 93%. ----------------------------------------------------------------------------------------------------.   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 Mainly for sleep.   Can't take O2 to his very physical job CPAP 10/ [facility] with O2 2L   Download compliance- has new card but hasn't installed in his machine Body weight today- 240 lbs LOV 08/12/22- Bjorkman, NP CAP RML. Dyspnea gradually worse.Retired but back working a Designer, multimedia. Wears O2 through CPAP at night. Usually no productive cough. Walk test 09/13/22- room air baseline O2 sat 93%/ HR 102. After 2 laps sat 88%, HR 118. With POC RA 87%, on 2L pulse sat 93%._ ordering POC through [facility] We will recheck CXR after pneumonia, and contact [facility] about POC on 2L pulse. CXR 08/12/22-  IMPRESSION: Slight interval improvement of the previously described right lower lung opacity, likely resolving pneumonia. Recommend 1 additional follow-up chest  radiograph in 6-8 weeks to ensure complete resolution.  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 Mainly for sleep.   CPAP 10/ [facility] with O2 2L   Download compliance-  Body weight today-   CTa Chest 10/02/22- IMPRESSION: CTA of the chest: No evidence of aortic dissection or pulmonary emboli.   Multiple old rib fractures are identified on the right.   ROS-see HPI + = positive Constitutional:   No-   weight loss, night sweats, fevers, chills, fatigue, lassitude. HEENT:   No-  headaches, difficulty swallowing, tooth/dental problems, sore throat,       No-  sneezing, itching, ear ache, nasal congestion, post nasal drip,  CV:  No-   chest pain, orthopnea, PND, swelling in lower extremities, anasarca,                   dizziness, palpitations Resp: +  shortness of breath with exertion or at rest.               productive cough,  + non-productive cough,  No- coughing up of blood.          change in color of mucus.  No- wheezing.  +snores Skin: No-   rash or lesions. GI:  No-   heartburn, indigestion, abdominal pain, nausea, vomiting,  GU:  MS:  No-   joint pain or swelling.  . Neuro-     nothing unusual Psych:  No- change in mood or affect. No depression or anxiety.  No memory loss.  OBJ- Physical Exam General- Alert, Oriented, Affect-appropriate, Distress- none acute Skin-  Lymphadenopathy- none Head- atraumatic            Eyes- Gross vision intact, PERRLA, conjunctivae and secretions clear            Ears- Hearing, canals-normal            Nose- Clear, no-Septal dev, mucus, polyps, erosion, perforation             Throat- Mallampati III-IV , mucosa clear , drainage-  none, tonsils- atrophic Neck- flexible , trachea midline, no stridor , thyroid nl, carotid no bruit Chest - symmetrical excursion , unlabored           Heart/CV- RRR , no murmur , no gallop  , no rub, nl s1 s2                           - JVD- none , edema- none, stasis changes- none, varices- none           Lung-  distant, wheeze- none, cough+congested, dullness-none, rub- none           Chest wall-  Abd- tender-no, distended-no, bowel sounds-present, HSM- no Br/ Gen/ Rectal- Not done, not indicated Extrem- cyanosis- none, clubbing, none, atrophy- none, strength- nl,             Neuro-+ tremor hands and mild head-bob

## 2022-12-13 ENCOUNTER — Ambulatory Visit (INDEPENDENT_AMBULATORY_CARE_PROVIDER_SITE_OTHER): Payer: Medicare Other | Admitting: Internal Medicine

## 2022-12-13 ENCOUNTER — Encounter: Payer: Self-pay | Admitting: Internal Medicine

## 2022-12-13 VITALS — BP 118/88 | HR 86 | Ht 74.0 in | Wt 209.2 lb

## 2022-12-13 DIAGNOSIS — J9611 Chronic respiratory failure with hypoxia: Secondary | ICD-10-CM | POA: Diagnosis not present

## 2022-12-13 DIAGNOSIS — J441 Chronic obstructive pulmonary disease with (acute) exacerbation: Secondary | ICD-10-CM

## 2022-12-13 NOTE — Assessment & Plan Note (Signed)
He continues to need oxygen for sleep Plan-continue O2 3 L for sleep.  Portable O2 if needed, especially as he gets more active after vertebral fracture is resolved.

## 2022-12-13 NOTE — Patient Instructions (Signed)
We can continue same breathing meds.  We can reconsider CPAP once your fracture issues are fixed.  Please call if we can help

## 2022-12-13 NOTE — Assessment & Plan Note (Signed)
He feels current medications including Trelegy and his nebulizer machine are adequate currently.  Medications reviewed.

## 2022-12-14 ENCOUNTER — Other Ambulatory Visit: Payer: Self-pay | Admitting: Orthopedic Surgery

## 2022-12-14 DIAGNOSIS — S32010A Wedge compression fracture of first lumbar vertebra, initial encounter for closed fracture: Secondary | ICD-10-CM

## 2022-12-15 ENCOUNTER — Ambulatory Visit
Admission: RE | Admit: 2022-12-15 | Discharge: 2022-12-15 | Disposition: A | Discharge status: Home / Self Care | Payer: Medicare Other | Source: Ambulatory Visit | Attending: Orthopedic Surgery | Admitting: Orthopedic Surgery

## 2022-12-15 DIAGNOSIS — S32010A Wedge compression fracture of first lumbar vertebra, initial encounter for closed fracture: Secondary | ICD-10-CM | POA: Insufficient documentation

## 2022-12-20 ENCOUNTER — Other Ambulatory Visit: Payer: Self-pay | Admitting: Orthopedic Surgery

## 2022-12-20 DIAGNOSIS — S32010A Wedge compression fracture of first lumbar vertebra, initial encounter for closed fracture: Secondary | ICD-10-CM

## 2022-12-20 NOTE — Progress Notes (Signed)
Please schedule a telephone visit with [name] to review results.

## 2022-12-27 ENCOUNTER — Encounter: Payer: Self-pay | Admitting: Orthopedic Surgery

## 2022-12-27 ENCOUNTER — Telehealth: Payer: Self-pay | Admitting: Orthopedic Surgery

## 2022-12-27 DIAGNOSIS — S32010A Wedge compression fracture of first lumbar vertebra, initial encounter for closed fracture: Secondary | ICD-10-CM

## 2022-12-27 DIAGNOSIS — S22080A Wedge compression fracture of T11-T12 vertebra, initial encounter for closed fracture: Secondary | ICD-10-CM

## 2022-12-27 NOTE — Telephone Encounter (Signed)
Lumbar MRI dated 12/15/22:  FINDINGS: Segmentation: Conventional numbering is assumed with 5 non-rib-bearing, lumbar type vertebral bodies.   Alignment:  Normal.   Vertebrae: Severe compression deformity of the L1 vertebral body with 7 mm retropulsion, edema and hypointense cleft. Mild compression deformity of the T12 superior endplate with edema and hypointense cleft. No significant retropulsion at T12.   Conus medullaris and cauda equina: Conus extends to the L2 level. Conus and cauda equina appear normal.   Paraspinal and other soft tissues: Unremarkable.   Disc levels:   Metal artifact from prior percutaneous sacroiliac joint fixation screws.   T12-L1: Small disc bulge without spinal canal stenosis or neural foraminal narrowing.   L1-L2: Retropulsion results in mild spinal canal stenosis. Vertebral body height loss contributes to mild bilateral neural foraminal narrowing.   L2-L3: Small left foraminal disc protrusion. No spinal canal stenosis or neural foraminal narrowing.   L3-L4: Left eccentric disc bulge and epidural lipomatosis contribute to mild narrowing of the thecal sac and mild left neural foraminal narrowing.   L4-L5: Left eccentric disc bulge and facet arthropathy results in mild spinal canal stenosis, moderate left and mild right neural foraminal narrowing.   L5-S1: Small disc bulge and mild bilateral facet arthropathy. No spinal canal stenosis or significant neural foraminal narrowing.   IMPRESSION: 1. Acute to subacute severe compression deformity of the L1 vertebral body with 7 mm of retropulsion, resulting in mild spinal canal stenosis at L1-L2. 2. Acute to subacute mild compression deformity of the superior endplate of T12. No significant retropulsion at this level. 3. Multilevel lumbar spondylosis, worst at L4-5, where there is mild spinal canal stenosis and moderate left neural foraminal narrowing.     Electronically Signed   By: Orvan Falconer M.D.   On: 12/16/2022 16:30  I have personally reviewed the images and agree with the above interpretation.   Above MRI reviewed with Dr. Myer Haff. Okay to send to IR to see if kyphoplasty is an option.   Spoke with patient. He is still having significant back pain. He would like to see IR to discuss possible kyphoplasty.   Orders done.

## 2022-12-28 ENCOUNTER — Encounter: Payer: Self-pay | Admitting: Internal Medicine

## 2022-12-29 ENCOUNTER — Ambulatory Visit
Admission: RE | Admit: 2022-12-29 | Discharge: 2022-12-29 | Disposition: A | Discharge status: Home / Self Care | Payer: Medicare Other | Source: Ambulatory Visit | Attending: Orthopedic Surgery | Admitting: Orthopedic Surgery

## 2022-12-29 ENCOUNTER — Other Ambulatory Visit (HOSPITAL_COMMUNITY): Payer: Self-pay | Admitting: Interventional Radiology

## 2022-12-29 ENCOUNTER — Other Ambulatory Visit: Payer: Self-pay | Admitting: Orthopedic Surgery

## 2022-12-29 DIAGNOSIS — S22080A Wedge compression fracture of T11-T12 vertebra, initial encounter for closed fracture: Secondary | ICD-10-CM

## 2022-12-29 DIAGNOSIS — S32010A Wedge compression fracture of first lumbar vertebra, initial encounter for closed fracture: Secondary | ICD-10-CM

## 2022-12-29 DIAGNOSIS — M8008XA Age-related osteoporosis with current pathological fracture, vertebra(e), initial encounter for fracture: Secondary | ICD-10-CM

## 2022-12-29 HISTORY — PX: IR RADIOLOGIST EVAL & MGMT: IMG5224

## 2022-12-29 MED ORDER — AMOXICILLIN-POT CLAVULANATE 875-125 MG PO TABS: 1.0000 | ORAL_TABLET | Freq: Two times a day (BID) | ORAL | 0 refills | Status: DC

## 2022-12-29 MED ORDER — PREDNISONE 10 MG PO TABS: ORAL_TABLET | ORAL | 0 refills | Status: DC

## 2022-12-29 NOTE — Consult Note (Signed)
Chief Complaint: Patient was seen in consultation today for thoracic and lumbar compression fractures at the request of Luna,Stacy  Referring Physician(s): Luna,Stacy  History of Present Illness: [name] is a 67 y.o. male Who presents at the kind request of Drake Leach to discuss possible cement augmentation for 2 nonhealing and progressive vertebral compression fractures.  [name] issues started in January when he tripped on the oxygen hosing in his home falling onto the hardwood floor.  At that time, he sustained a mild compression fracture of the L1 vertebral body as identified on CT imaging from October 02, 2022.  He was taken to the hospital and treated conservatively and subsequently discharged to rehab.  He began wearing a TLSO brace.  Unfortunately, he has had continued progressive back pain throughout his time at rehab and was ultimately discharged with no significant improvement.  Repeat imaging confirms progression of the L1 compression fracture with near vertebral plana appearance as well as a new spontaneous osteoporotic compression fracture involving T12.  His pain is quite severe and stays consistent between a 5 and a 7 throughout the day.  It is worse with movement, activity and when laying down.  He has to wear his TLSO brace continuously although it feels like it constricts his chest and affects his breathing and underlying COPD.  His pain and symptoms are fairly debilitating.  He scored a 14 out of 24 on the [employer] disability questionnaire.  He denies lower extremity weakness, numbness, paresthesias or other symptoms of cord compression.  Past Medical History:  Diagnosis Date   COPD (chronic obstructive pulmonary disease)    COPD (chronic obstructive pulmonary disease)    GERD (gastroesophageal reflux disease)    History of hepatitis C    treated several years ago through [facility]   Hyperlipidemia    Hypertension    OSA (obstructive sleep apnea)      Past Surgical History:  Procedure Laterality Date   CATARACT EXTRACTION, BILATERAL     EYE SURGERY Bilateral    IR RADIOLOGIST EVAL & MGMT  12/29/2022   IR THORACENTESIS ASP PLEURAL SPACE W/IMG GUIDE  09/14/2021   IR THORACENTESIS ASP PLEURAL SPACE W/IMG GUIDE  09/21/2021   ORIF PELVIC FRACTURE WITH PERCUTANEOUS SCREWS Right 07/29/2021   Procedure: SI SCREW FIXATION OF RIGHT PELVIC RING;  Surgeon: Myrene Galas, MD;  Location: [facility] OR;  Service: Orthopedics;  Laterality: Right;   ORTHOPEDIC SURGERY Bilateral    pt states fracture repairs to arms and legs.   REVERSE SHOULDER ARTHROPLASTY Left 09/23/2019   Procedure: REVERSE SHOULDER ARTHROPLASTY;  Surgeon: Jones Broom, MD;  Location: [facility] ORS;  Service: Orthopedics;  Laterality: Left;   TRACHEOSTOMY TUBE PLACEMENT N/A 08/10/2021   Procedure: TRACHEOSTOMY;  Surgeon: Violeta Gelinas, MD;  Location: [facility] OR;  Service: General;  Laterality: N/A;    Allergies: Patient has no known allergies.  Medications: Prior to Admission medications   Medication Sig Start Date End Date Taking? Authorizing Provider  acetaminophen (TYLENOL) 325 MG tablet Take 650 mg by mouth every 8 (eight) hours as needed.    [provider]  albuterol (PROVENTIL) (2.5 MG/3ML) 0.083% nebulizer solution Take 2.5 mg by nebulization every 6 (six) hours as needed for wheezing or shortness of breath.    [provider]  albuterol (VENTOLIN HFA) 108 (90 Base) MCG/ACT inhaler Inhale 2 puffs into the lungs every 4 (four) hours as needed for wheezing or shortness of breath.    [provider]  aspirin EC 81 MG tablet Take 81 mg by mouth daily.    [provider]  atorvastatin (LIPITOR) 40 MG tablet TAKE 1 TABLET BY MOUTH ONCE DAILY 05/06/22   O'Neal, Ronnald RampWesley Thomas, MD  Cholecalciferol (VITAMIN D3) 25 MCG (1000 UT) CAPS Give 2 by mouth once daily.    [provider]  fluocinonide (LIDEX) 0.05 % external solution Apply 1 Application  topically 2 (two) times daily as needed (skin irritation). 09/27/22   [provider]  Fluticasone-Umeclidin-Vilant (TRELEGY ELLIPTA) 100-62.5-25 MCG/ACT AEPB INHALE 1 PUFF INTO THE LUNGS EVERY DAY AT THE SAME TIME EACH DAY 06/16/22   Jetty DuhamelYoung, Clinton D, MD  folic acid (FOLVITE) 1 MG tablet Take 1 tablet (1 mg total) by mouth daily. 10/14/22   Marguerita MerlesSheikh, Omair Latif, DO  ketoconazole (NIZORAL) 2 % cream Apply 1 Application topically 2 (two) times daily as needed for irritation.    [provider]  ketoconazole (NIZORAL) 2 % shampoo Apply 1 Application topically 2 (two) times a week.    [provider]  Lactulose 20 GM/30ML SOLN Take 15 mLs (10 g total) by mouth daily. 10/27/22   Sharon SellerEubanks, Jessica K, NP  lidocaine (LIDODERM) 5 % Place 1 patch onto the skin at bedtime. Remove & Discard patch within 12 hours or as directed by MD 10/13/22   Marguerita MerlesSheikh, Omair Latif, DO  lisinopril (ZESTRIL) 10 MG tablet Take 1 tablet (10 mg total) by mouth daily. 10/14/22   Sheikh, Omair Latif, DO  meloxicam (MOBIC) 15 MG tablet TAKE ONE TABLET (15 MG TOTAL) BY MOUTH DAILY. TAKE WITH FOOD. 12/14/22   Venetia NightYarbrough, Chester, MD  methocarbamol (ROBAXIN) 500 MG tablet Take 1 tablet (500 mg total) by mouth every 8 (eight) hours as needed for muscle spasms. Stop the flexeril. This replaces it. 12/07/22   Drake LeachLuna, Stacy, PA-C  metoprolol succinate (TOPROL-XL) 25 MG 24 hr tablet Take 25 mg by mouth daily. 09/30/22   [provider]  Multiple Vitamins-Minerals (MULTI FOR HIM 50+ PO) Take 1 tablet by mouth daily.    [provider]  oxyCODONE (OXY IR/ROXICODONE) 5 MG immediate release tablet Take 1 tablet (5 mg total) by mouth every 6 (six) hours as needed for severe pain. Pain scale 0-5 12/01/22   Drake LeachLuna, Stacy, PA-C  OXYGEN Inhale 3 L into the lungs at bedtime.    [provider]  pantoprazole sodium (PROTONIX) 40 mg Take 40 mg by mouth daily.    [provider]  polyethylene glycol (MIRALAX /  GLYCOLAX) 17 g packet Take 17 g by mouth daily as needed for moderate constipation. 10/13/22   Marguerita MerlesSheikh, Omair Latif, DO  pregabalin (LYRICA) 50 MG capsule Take 1 capsule (50 mg total) by mouth 2 (two) times daily. 10/27/22   Sharon SellerEubanks, Jessica K, NP  roflumilast (DALIRESP) 500 MCG TABS tablet Take 500 mcg by mouth daily.    [provider]  senna-docusate (SENOKOT-S) 8.6-50 MG tablet Take 1 tablet by mouth at bedtime. 10/13/22   Marguerita MerlesSheikh, Omair Latif, DO  tamsulosin (FLOMAX) 0.4 MG CAPS capsule Take 0.4 mg by mouth daily. 03/18/22   [provider]  theophylline (THEO-24) 200 MG 24 hr capsule Take 200 mg by mouth 2 (two) times daily.    [provider]  thiamine (VITAMIN B-1) 100 MG tablet Take 1 tablet (100 mg total) by mouth daily. 10/14/22   Merlene LaughterSheikh, Omair Latif, DO     Family History  Problem Relation Age of Onset   Heart disease Mother  Hypertension Father     Social History   Socioeconomic History   Marital status: Single    Spouse name: Not on file   Number of children: 2   Years of education: Not on file   Highest education level: Not on file  Occupational History   Occupation: Holiday representative  Tobacco Use   Smoking status: Former    Packs/day: 1.00    Years: 35.00    Additional pack years: 0.00    Total pack years: 35.00    Types: Cigarettes    Start date: 55    Quit date: 10/01/2010    Years since quitting: 12.2   Smokeless tobacco: Never  Vaping Use   Vaping Use: Never used  Substance and Sexual Activity   Alcohol use: Yes    Alcohol/week: 42.0 standard drinks of alcohol    Types: 42 Cans of beer per week    Comment: daily - 6 drinks a day   Drug use: Never    Comment: last marijuana 2-3 weeks ago   Sexual activity: Not Currently  Other Topics Concern   Not on file  Social History Narrative   Not on file   Social Determinants of Health   Financial Resource Strain: Not on file  Food Insecurity: No Food Insecurity (10/03/2022)   Hunger  Vital Sign    Worried About Running Out of Food in the Last Year: Never true    Ran Out of Food in the Last Year: Never true  Transportation Needs: No Transportation Needs (10/03/2022)   PRAPARE - Administrator, Civil Service (Medical): No    Lack of Transportation (Non-Medical): No  Physical Activity: Not on file  Stress: Not on file  Social Connections: Not on file    Review of Systems: A 12 point ROS discussed and pertinent positives are indicated in the HPI above.  All other systems are negative.  Review of Systems  Vital Signs: BP (!) 153/98   Pulse (!) 119   Temp 97.8 F (36.6 C)   Resp 16   SpO2 97%   Physical Exam Constitutional:      General: He is not in acute distress.    Appearance: Normal appearance. He is normal weight.  HENT:     Head: Normocephalic and atraumatic.  Eyes:     General: No scleral icterus. Cardiovascular:     Rate and Rhythm: Normal rate.  Pulmonary:     Effort: Pulmonary effort is normal.  Abdominal:     General: Abdomen is flat.  Musculoskeletal:       Back:     Comments: Focal TTP at the T12 and L1 spinous processes  Skin:    General: Skin is warm and dry.  Neurological:     Mental Status: He is alert and oriented to person, place, and time.  Psychiatric:        Mood and Affect: Mood normal.        Behavior: Behavior normal.       Imaging: IR Radiologist Eval & Mgmt  Result Date: 12/29/2022 EXAM: NEW PATIENT OFFICE VISIT CHIEF COMPLAINT: SEE EPIC NOTE HISTORY OF PRESENT ILLNESS: SEE EPIC NOTE REVIEW OF SYSTEMS: SEE EPIC NOTE PHYSICAL EXAMINATION: SEE EPIC NOTE ASSESSMENT AND PLAN: SEE EPIC NOTE Electronically Signed   By: Malachy Moan M.D.   On: 12/29/2022 10:14   MR LUMBAR SPINE WO CONTRAST  Result Date: 12/16/2022 CLINICAL DATA:  Compression fracture, lumbar. EXAM: MRI LUMBAR SPINE WITHOUT CONTRAST TECHNIQUE: Multiplanar,  multisequence MR imaging of the lumbar spine was performed. No intravenous contrast  was administered. COMPARISON:  Lumbar spine radiographs 12/01/2022. FINDINGS: Segmentation: Conventional numbering is assumed with 5 non-rib-bearing, lumbar type vertebral bodies. Alignment:  Normal. Vertebrae: Severe compression deformity of the L1 vertebral body with 7 mm retropulsion, edema and hypointense cleft. Mild compression deformity of the T12 superior endplate with edema and hypointense cleft. No significant retropulsion at T12. Conus medullaris and cauda equina: Conus extends to the L2 level. Conus and cauda equina appear normal. Paraspinal and other soft tissues: Unremarkable. Disc levels: Metal artifact from prior percutaneous sacroiliac joint fixation screws. T12-L1: Small disc bulge without spinal canal stenosis or neural foraminal narrowing. L1-L2: Retropulsion results in mild spinal canal stenosis. Vertebral body height loss contributes to mild bilateral neural foraminal narrowing. L2-L3: Small left foraminal disc protrusion. No spinal canal stenosis or neural foraminal narrowing. L3-L4: Left eccentric disc bulge and epidural lipomatosis contribute to mild narrowing of the thecal sac and mild left neural foraminal narrowing. L4-L5: Left eccentric disc bulge and facet arthropathy results in mild spinal canal stenosis, moderate left and mild right neural foraminal narrowing. L5-S1: Small disc bulge and mild bilateral facet arthropathy. No spinal canal stenosis or significant neural foraminal narrowing. IMPRESSION: 1. Acute to subacute severe compression deformity of the L1 vertebral body with 7 mm of retropulsion, resulting in mild spinal canal stenosis at L1-L2. 2. Acute to subacute mild compression deformity of the superior endplate of T12. No significant retropulsion at this level. 3. Multilevel lumbar spondylosis, worst at L4-5, where there is mild spinal canal stenosis and moderate left neural foraminal narrowing. Electronically Signed   By: Orvan Falconer M.D.   On: 12/16/2022 16:30   DG Eye  Foreign Body  Result Date: 12/04/2022 CLINICAL DATA:  MRI clearance EXAM: ORBITS FOR FOREIGN BODY - 2 VIEW FINDINGS: There is no evidence of metallic foreign body within the orbits. Bilateral maxillary plate and screw fixation. Mandibular plate and screw fixation. Dental restorations. IMPRESSION: No evidence of metallic foreign body within the orbits. Electronically Signed   By: Corlis Leak M.D.   On: 12/04/2022 19:53   DG Lumbar Spine 2-3 Views  Result Date: 12/04/2022 CLINICAL DATA:  L1 compression fracture, persistent pain EXAM: LUMBAR SPINE - 2-3 VIEW COMPARISON:  11/03/2022 FINDINGS: stable vertebral compression deformity of L1 with greater than 80% loss of height anteriorly, stable mild posterior retropulsion. No acute fracture. Normal alignment. Fixation hardware across bilateral sacroiliac joints. Aortic Atherosclerosis (ICD10-170.0). Right femoral neck fixation hardware partially visualized. IMPRESSION: Stable L1 compression deformity. No acute findings. Electronically Signed   By: Corlis Leak M.D.   On: 12/04/2022 19:52    Labs:  CBC: Recent Labs    10/11/22 0303 10/12/22 0322 10/13/22 0201 10/14/22 0201  WBC 8.9 7.6 6.8 6.6  HGB 13.3 13.4 12.7* 13.2  HCT 37.5* 37.7* 35.9* 38.0*  PLT 220 256 254 245    COAGS: No results for input(s): "INR", "APTT" in the last 8760 hours.  BMP: Recent Labs    10/11/22 0303 10/12/22 0322 10/13/22 0201 10/14/22 0201  NA 133* 136 133* 135  K 3.2* 3.2* 3.6 3.7  CL 102 106 104 106  CO2 21* 23 22 21*  GLUCOSE 125* 110* 108* 104*  BUN 18 13 9  7*  CALCIUM 8.7* 8.9 8.6* 9.0  CREATININE 0.75 0.70 0.69 0.68  GFRNONAA >60 >60 >60 >60    LIVER FUNCTION TESTS: Recent Labs    10/08/22 0340 10/09/22 0356 10/10/22 0053 10/13/22  0201  BILITOT 0.9 0.9 0.9 0.5  AST 29 37 29 32  ALT 29 38 39 36  ALKPHOS 88 98 96 98  PROT 6.2* 7.4 7.0 6.5  ALBUMIN 3.4* 3.9 3.6 3.2*    TUMOR MARKERS: No results for input(s): "AFPTM", "CEA", "CA199",  "CHROMGRNA" in the last 8760 hours.  Assessment & Plan:   Patient has suffered subacute osteoporotic fracture of the T12 and L1 vertebra.   History and exam have demonstrated the following:  Acute/Subacute fracture by imaging dated 12/16/22, Pain on exam concordant with level of fracture, Failure of conservative therapy and pain refractory to narcotic pain mediation, and Significant disability on the [employer] Disability Questionnaire with 15/24 positive symptoms, reflecting significant impact/impairment of (ADLs)   ICD-10-CM Codes that Support Medical Necessity (W[employer]Blog.at.[email or web address]?[email or web address])  M80.08XA    Age-related osteoporosis with current pathological fracture, vertebra(e), initial encounter for fracture, S22.080A    Wedge compression fracture of T11-T12 vertebra, initial encounter for closed fracture , and S32.010A    Wedge compression fracture of first lumbar vertebra, initial encounter for closed fracture    Plan:  T12 and L1 vertebral body augmentation with balloon kyphoplasty  Post-procedure disposition: outpatient at [facility]  Medication holds: Aspirin  The patient has suffered a fracture of the T12 and L1 vertebral bodies. It is recommended that patients aged 87 years or older be evaluated for possible testing or treatment of osteoporosis. A copy of this consult report is sent to the patient's referring physician.    Total time spent on today's visit was over  40 Minutes including both face-to-face time and non face-to-face time, personally spent on review of chart (including labs and relevant imaging), discussing further workup and treatment options, referral to specialist if needed, reviewing outside records if pertinent, answering patient questions, and coordinating care regarding T12 and L1 fractures as well as management strategy.    Thank you for this interesting consult.  I greatly enjoyed meeting [name] and  look forward to participating in their care.  A copy of this report was sent to the requesting provider on this date.  Electronically Signed: Kandis Cocking Naquan Garman 12/29/2022, 10:35 AM

## 2022-12-29 NOTE — Telephone Encounter (Signed)
Received the following msg from the pt vita mychart:    [name]  P [facility] Pool (supporting Waymon Budge, MD)19 hours ago (4:18 PM)    As you know from my last visit I broke my back. During my hospital stay and rehab I did not get many breathing treatments. I am in need of a strong prednisone taper and some antibiotics I just wondered if you  could call that in for me. Thank-you    12/29/22 11:50 AM- called and spoke with the pt.  He states he is still having to wear a back brace, and that this is making him feel more SOB bc his breathing is constricted. He states that he is coughing more over the past few wks, producing large amounts of white sputum. He has minimal using. Denies fever. He is using his trelegy inhaler ,albuterol inhaler and albuterol neb daily. Asking for abx and pred. Please advise, thanks!  No Known Allergies

## 2022-12-29 NOTE — Telephone Encounter (Signed)
Please send prednisone 10 mg, # 42     6 x 2 days, 5 x 2 days, 4 x 2 days, 3 x 2 days, 2 x 2 days, 1 x 2 day                       Augmentin 875 mg, # 14, 1 twice dailyt

## 2022-12-30 LAB — CBC
HCT: 44.7 % (ref 38.5–50.0)
Hemoglobin: 15.1 g/dL (ref 13.2–17.1)
MCH: 31.1 pg (ref 27.0–33.0)
MCHC: 33.8 g/dL (ref 32.0–36.0)
MCV: 92 fL (ref 80.0–100.0)
MPV: 11 fL (ref 7.5–12.5)
Platelets: 245 10*3/uL (ref 140–400)
RBC: 4.86 10*6/uL (ref 4.20–5.80)
RDW: 13.2 % (ref 11.0–15.0)
WBC: 9 10*3/uL (ref 3.8–10.8)

## 2022-12-30 LAB — COMPLETE METABOLIC PANEL WITH GFR
AG Ratio: 1.6 (calc) (ref 1.0–2.5)
ALT: 16 U/L (ref 9–46)
AST: 25 U/L (ref 10–35)
Albumin: 4.7 g/dL (ref 3.6–5.1)
Alkaline phosphatase (APISO): 113 U/L (ref 35–144)
BUN/Creatinine Ratio: 15 (calc) (ref 6–22)
BUN: 9 mg/dL (ref 7–25)
CO2: 16 mmol/L — ABNORMAL LOW (ref 20–32)
Calcium: 10.1 mg/dL (ref 8.6–10.3)
Chloride: 104 mmol/L (ref 98–110)
Creat: 0.61 mg/dL — ABNORMAL LOW (ref 0.70–1.35)
Globulin: 2.9 g/dL (calc) (ref 1.9–3.7)
Glucose, Bld: 108 mg/dL (ref 65–139)
Potassium: 3.9 mmol/L (ref 3.5–5.3)
Sodium: 136 mmol/L (ref 135–146)
Total Bilirubin: 0.4 mg/dL (ref 0.2–1.2)
Total Protein: 7.6 g/dL (ref 6.1–8.1)
eGFR: 106 mL/min/{1.73_m2} (ref >=60)

## 2023-01-04 ENCOUNTER — Telehealth: Payer: PRIVATE HEALTH INSURANCE | Admitting: Orthopedic Surgery

## 2023-01-04 NOTE — Telephone Encounter (Signed)
Powers, Margarito Liner, CMA  [facility] - [place] - [address] [phone] months ago    Per dpr lvm advising patient of albuterol refill  Outgoing call

## 2023-01-09 ENCOUNTER — Other Ambulatory Visit: Payer: Medicare Other

## 2023-01-09 ENCOUNTER — Ambulatory Visit (INDEPENDENT_AMBULATORY_CARE_PROVIDER_SITE_OTHER): Payer: Medicare Other | Admitting: Family Medicine

## 2023-01-09 ENCOUNTER — Other Ambulatory Visit: Payer: Self-pay

## 2023-01-09 ENCOUNTER — Encounter: Payer: Self-pay | Admitting: Family Medicine

## 2023-01-09 VITALS — BP 132/92 | HR 98 | Temp 98.3°F | Ht 74.0 in | Wt 214.0 lb

## 2023-01-09 DIAGNOSIS — J439 Emphysema, unspecified: Secondary | ICD-10-CM

## 2023-01-09 DIAGNOSIS — Z125 Encounter for screening for malignant neoplasm of prostate: Secondary | ICD-10-CM | POA: Diagnosis not present

## 2023-01-09 DIAGNOSIS — I10 Essential (primary) hypertension: Secondary | ICD-10-CM | POA: Diagnosis not present

## 2023-01-09 DIAGNOSIS — E782 Mixed hyperlipidemia: Secondary | ICD-10-CM | POA: Diagnosis not present

## 2023-01-09 DIAGNOSIS — K219 Gastro-esophageal reflux disease without esophagitis: Secondary | ICD-10-CM

## 2023-01-09 DIAGNOSIS — E663 Overweight: Secondary | ICD-10-CM

## 2023-01-09 DIAGNOSIS — E038 Other specified hypothyroidism: Secondary | ICD-10-CM

## 2023-01-09 DIAGNOSIS — J4489 Other specified chronic obstructive pulmonary disease: Secondary | ICD-10-CM

## 2023-01-09 NOTE — Discharge Instructions (Signed)
Kyphoplasty Post Procedure Discharge Instructions  May resume a regular diet and any medications that you routinely take (including pain medications). However, if you are taking Aspirin or an anticoagulant/blood thinner you will be told when you can resume taking these by the healthcare provider. No driving day of procedure. The day of your procedure take it easy. You may use an ice pack as needed to injection sites on back.  Ice to back 30 minutes on and 30 minutes off, as needed. May remove bandaids tomorrow after taking a shower. Replace daily with a clean bandaid until healed.  Do not lift anything heavier than a milk jug for 1-2 weeks or determined by your physician.  Follow up with your physician in 2 weeks.    Please contact our office at [phone] for the following symptoms or if you have any questions:  Fever greater than 100 degrees Increased swelling, pain, or redness at injection site. Increased back and/or leg pain New numbness or change in symptoms from before the procedure.    May resume aspirin immediately after procedure.  Thank you for visiting [facility]. 

## 2023-01-09 NOTE — Assessment & Plan Note (Signed)
Currently taking Atorvastatin  daily. Fasting labs this afternoon.

## 2023-01-09 NOTE — Patient Instructions (Signed)
It was great to meet you today and I'm excited to have you join the [facility] practice. I hope you had a positive experience today! If you feel so inclined, please feel free to recommend our practice to friends and family. Hildagarde Holleran, FNP-C  

## 2023-01-09 NOTE — Assessment & Plan Note (Signed)
Followed by Pulmonology. Well controlled with Albuterol BID and Trelegy-Ellipta daily.

## 2023-01-09 NOTE — Assessment & Plan Note (Signed)
Well controlled on Protonix 40 mg daily.

## 2023-01-09 NOTE — Progress Notes (Signed)
New Patient Office Visit  Subjective    Patient ID: [name], male    DOB: 02-Apr-1956  Age: 67 y.o. MRN: [id number]  CC: No chief complaint on file.   HPI [name] presents to establish care. Oriented to practice routines and expectations. PMH includes COPD, OSA on CPAP, GERD, HTN, HLD, hepatitis C. He does see cardiology, pulmonology, neurology, and neurosurgery.  HYPERTENSION without Chronic Kidney Disease Hypertension status: controlled  Satisfied with current treatment? yes Duration of hypertension: chronic BP monitoring frequency:  a few times a week BP range: 120-140/<90 BP medication side effects:  no Medication compliance: excellent compliance Previous BP meds:HCTZ and lisinopril Aspirin: yes Recurrent headaches: no Visual changes: no Palpitations: no Dyspnea: yes Chest pain: no Lower extremity edema: no Dizzy/lightheaded: no    Outpatient Encounter Medications as of 01/09/2023  Medication Sig   albuterol (PROVENTIL) (2.5 MG/3ML) 0.083% nebulizer solution Take 2.5 mg by nebulization every 6 (six) hours as needed for wheezing or shortness of breath.   albuterol (VENTOLIN HFA) 108 (90 Base) MCG/ACT inhaler Inhale 2 puffs into the lungs every 4 (four) hours as needed for wheezing or shortness of breath.   aspirin EC 81 MG tablet Take 81 mg by mouth daily.   atorvastatin (LIPITOR) 40 MG tablet TAKE 1 TABLET BY MOUTH ONCE DAILY   Cholecalciferol (VITAMIN D3) 25 MCG (1000 UT) CAPS Give 2 by mouth once daily.   fluocinonide (LIDEX) 0.05 % external solution Apply 1 Application topically 2 (two) times daily as needed (skin irritation).   Fluticasone-Umeclidin-Vilant (TRELEGY ELLIPTA) 100-62.5-25 MCG/ACT AEPB INHALE 1 PUFF INTO THE LUNGS EVERY DAY AT THE SAME TIME EACH DAY   hydrochlorothiazide (HYDRODIURIL) 12.5 MG tablet Take 12.5 mg by mouth daily.   lisinopril (ZESTRIL) 10 MG tablet Take 1 tablet (10 mg total) by mouth daily.   meloxicam (MOBIC) 15 MG  tablet TAKE ONE TABLET (15 MG TOTAL) BY MOUTH DAILY. TAKE WITH FOOD.   metoprolol succinate (TOPROL-XL) 25 MG 24 hr tablet Take 25 mg by mouth daily.   Multiple Vitamins-Minerals (MULTI FOR HIM 50+ PO) Take 1 tablet by mouth daily.   OXYGEN Inhale 3 L into the lungs at bedtime.   pantoprazole sodium (PROTONIX) 40 mg Take 40 mg by mouth daily.   tamsulosin (FLOMAX) 0.4 MG CAPS capsule Take 0.4 mg by mouth daily.   theophylline (THEO-24) 200 MG 24 hr capsule Take 200 mg by mouth 2 (two) times daily.   [DISCONTINUED] acetaminophen (TYLENOL) 325 MG tablet Take 650 mg by mouth every 8 (eight) hours as needed.   [DISCONTINUED] cyclobenzaprine (FLEXERIL) 5 MG tablet Take 5 mg by mouth 3 (three) times daily as needed for muscle spasms.   [DISCONTINUED] amoxicillin-clavulanate (AUGMENTIN) 875-125 MG tablet Take 1 tablet by mouth 2 (two) times daily. (Patient not taking: Reported on 01/09/2023)   [DISCONTINUED] folic acid (FOLVITE) 1 MG tablet Take 1 tablet (1 mg total) by mouth daily. (Patient not taking: Reported on 01/09/2023)   [DISCONTINUED] ketoconazole (NIZORAL) 2 % cream Apply 1 Application topically 2 (two) times daily as needed for irritation. (Patient not taking: Reported on 01/09/2023)   [DISCONTINUED] ketoconazole (NIZORAL) 2 % shampoo Apply 1 Application topically 2 (two) times a week. (Patient not taking: Reported on 01/09/2023)   [DISCONTINUED] Lactulose 20 GM/30ML SOLN Take 15 mLs (10 g total) by mouth daily. (Patient not taking: Reported on 01/09/2023)   [DISCONTINUED] lidocaine (LIDODERM) 5 % Place 1 patch onto the skin at bedtime. Remove &  Discard patch within 12 hours or as directed by MD (Patient not taking: Reported on 01/09/2023)   [DISCONTINUED] methocarbamol (ROBAXIN) 500 MG tablet Take 1 tablet (500 mg total) by mouth every 8 (eight) hours as needed for muscle spasms. Stop the flexeril. This replaces it. (Patient not taking: Reported on 01/09/2023)   [DISCONTINUED] oxyCODONE (OXY  IR/ROXICODONE) 5 MG immediate release tablet Take 1 tablet (5 mg total) by mouth every 6 (six) hours as needed for severe pain. Pain scale 0-5 (Patient not taking: Reported on 01/09/2023)   [DISCONTINUED] polyethylene glycol (MIRALAX / GLYCOLAX) 17 g packet Take 17 g by mouth daily as needed for moderate constipation. (Patient not taking: Reported on 01/09/2023)   [DISCONTINUED] predniSONE (DELTASONE) 10 MG tablet 6 x 2 days, 5 x 2 days, 4 x 2 days, 3 x 2 days, 2 x 2 days, 1 x 2 days, then stop (Patient not taking: Reported on 01/09/2023)   [DISCONTINUED] pregabalin (LYRICA) 50 MG capsule Take 1 capsule (50 mg total) by mouth 2 (two) times daily. (Patient not taking: Reported on 01/09/2023)   [DISCONTINUED] roflumilast (DALIRESP) 500 MCG TABS tablet Take 500 mcg by mouth daily. (Patient not taking: Reported on 01/09/2023)   [DISCONTINUED] senna-docusate (SENOKOT-S) 8.6-50 MG tablet Take 1 tablet by mouth at bedtime. (Patient not taking: Reported on 01/09/2023)   [DISCONTINUED] thiamine (VITAMIN B-1) 100 MG tablet Take 1 tablet (100 mg total) by mouth daily. (Patient not taking: Reported on 01/09/2023)   No facility-administered encounter medications on file as of 01/09/2023.    Past Medical History:  Diagnosis Date   COPD (chronic obstructive pulmonary disease)    COPD (chronic obstructive pulmonary disease)    GERD (gastroesophageal reflux disease)    History of hepatitis C    treated several years ago through [facility]   Hyperlipidemia    Hypertension    OSA (obstructive sleep apnea)     Past Surgical History:  Procedure Laterality Date   CATARACT EXTRACTION, BILATERAL     EYE SURGERY Bilateral    IR RADIOLOGIST EVAL & MGMT  12/29/2022   IR THORACENTESIS ASP PLEURAL SPACE W/IMG GUIDE  09/14/2021   IR THORACENTESIS ASP PLEURAL SPACE W/IMG GUIDE  09/21/2021   ORIF PELVIC FRACTURE WITH PERCUTANEOUS SCREWS Right 07/29/2021   Procedure: SI SCREW FIXATION OF RIGHT PELVIC RING;  Surgeon: Myrene Galas,  MD;  Location: [facility] OR;  Service: Orthopedics;  Laterality: Right;   ORTHOPEDIC SURGERY Bilateral    pt states fracture repairs to arms and legs.   REVERSE SHOULDER ARTHROPLASTY Left 09/23/2019   Procedure: REVERSE SHOULDER ARTHROPLASTY;  Surgeon: Jones Broom, MD;  Location: [facility] ORS;  Service: Orthopedics;  Laterality: Left;   TRACHEOSTOMY TUBE PLACEMENT N/A 08/10/2021   Procedure: TRACHEOSTOMY;  Surgeon: Violeta Gelinas, MD;  Location: [facility] OR;  Service: General;  Laterality: N/A;    Family History  Problem Relation Age of Onset   Heart disease Mother    Hypertension Father     Social History   Socioeconomic History   Marital status: Single    Spouse name: Not on file   Number of children: 2   Years of education: Not on file   Highest education level: Not on file  Occupational History   Occupation: Holiday representative  Tobacco Use   Smoking status: Former    Packs/day: 1.00    Years: 35.00    Additional pack years: 0.00    Total pack years: 35.00    Types: Cigarettes    Start date: 64  Quit date: 10/01/2010    Years since quitting: 12.2   Smokeless tobacco: Never  Vaping Use   Vaping Use: Never used  Substance and Sexual Activity   Alcohol use: Yes    Alcohol/week: 42.0 standard drinks of alcohol    Types: 42 Cans of beer per week    Comment: daily - 6 drinks a day   Drug use: Never    Comment: last marijuana 2-3 weeks ago   Sexual activity: Not Currently  Other Topics Concern   Not on file  Social History Narrative   Not on file   Social Determinants of Health   Financial Resource Strain: Not on file  Food Insecurity: No Food Insecurity (10/03/2022)   Hunger Vital Sign    Worried About Running Out of Food in the Last Year: Never true    Ran Out of Food in the Last Year: Never true  Transportation Needs: No Transportation Needs (10/03/2022)   PRAPARE - Administrator, Civil Service (Medical): No    Lack of Transportation (Non-Medical): No  Physical  Activity: Not on file  Stress: Not on file  Social Connections: Not on file  Intimate Partner Violence: Not At Risk (10/03/2022)   Humiliation, Afraid, Rape, and Kick questionnaire    Fear of Current or Ex-Partner: No    Emotionally Abused: No    Physically Abused: No    Sexually Abused: No    Review of Systems  All other systems reviewed and are negative.       Objective    BP (!) 132/92   Pulse 98   Temp 98.3 F (36.8 C) (Oral)   Ht 6\' 2"  (1.88 m)   Wt 214 lb (97.1 kg)   SpO2 94%   BMI 27.48 kg/m   Physical Exam Vitals and nursing note reviewed.  Constitutional:      Appearance: Normal appearance. He is normal weight.  HENT:     Head: Normocephalic and atraumatic.  Eyes:     Extraocular Movements: Extraocular movements intact.     Right eye: Normal extraocular motion and no nystagmus.     Left eye: Normal extraocular motion and no nystagmus.     Conjunctiva/sclera: Conjunctivae normal.     Pupils: Pupils are equal, round, and reactive to light.  Cardiovascular:     Rate and Rhythm: Normal rate and regular rhythm.     Pulses: Normal pulses.     Heart sounds: Normal heart sounds.  Pulmonary:     Effort: Pulmonary effort is normal.     Breath sounds: Normal breath sounds.  Abdominal:     General: Bowel sounds are normal.     Palpations: Abdomen is soft.  Genitourinary:    Comments: Deferred using shared decision making Musculoskeletal:        General: Normal range of motion.     Cervical back: Normal range of motion and neck supple.  Skin:    General: Skin is warm and dry.     Capillary Refill: Capillary refill takes less than 2 seconds.  Neurological:     General: No focal deficit present.     Mental Status: He is alert. Mental status is at baseline.     Motor: Tremor present.  Psychiatric:        Mood and Affect: Mood normal.        Speech: Speech normal.        Behavior: Behavior normal.        Thought Content: Thought  content normal.         Cognition and Memory: Cognition and memory normal.        Judgment: Judgment normal.     Last CBC Lab Results  Component Value Date   WBC 9.0 12/29/2022   HGB 15.1 12/29/2022   HCT 44.7 12/29/2022   MCV 92.0 12/29/2022   MCH 31.1 12/29/2022   RDW 13.2 12/29/2022   PLT 245 12/29/2022   Last metabolic panel Lab Results  Component Value Date   GLUCOSE 108 12/29/2022   NA 136 12/29/2022   K 3.9 12/29/2022   CL 104 12/29/2022   CO2 16 (L) 12/29/2022   BUN 9 12/29/2022   CREATININE 0.61 (L) 12/29/2022   EGFR 106 12/29/2022   CALCIUM 10.1 12/29/2022   PHOS 2.8 10/13/2022   PROT 7.6 12/29/2022   ALBUMIN 3.2 (L) 10/13/2022   BILITOT 0.4 12/29/2022   ALKPHOS 98 10/13/2022   AST 25 12/29/2022   ALT 16 12/29/2022   ANIONGAP 8 10/14/2022   Last lipids Lab Results  Component Value Date   CHOL 185 02/09/2022   HDL 61 02/09/2022   LDLCALC 85 02/09/2022   LDLDIRECT 106.0 01/29/2021   TRIG 235 (H) 02/09/2022   CHOLHDL 3.0 02/09/2022   Last hemoglobin A1c Lab Results  Component Value Date   HGBA1C 5.4 07/28/2021   Last thyroid functions Lab Results  Component Value Date   TSH 10.767 (H) 08/04/2021        Assessment & Plan:   Problem List Items Addressed This Visit     Overweight (BMI 25.0-29.9)   Relevant Orders   Lipid panel   TSH   Hyperlipidemia    Currently taking Atorvastatin  daily. Fasting labs this afternoon.      Relevant Medications   hydrochlorothiazide (HYDRODIURIL) 12.5 MG tablet   Other Relevant Orders   Lipid panel   Essential hypertension - Primary    Chronic. Followed by Cardiology twice a year. Well controlled on HCTZ 12.5mg  daily and Lisinopril  daily. He is on statin and ASA.      Relevant Medications   hydrochlorothiazide (HYDRODIURIL) 12.5 MG tablet   Other Relevant Orders   Lipid panel   Gastroesophageal reflux disease    Well controlled on Protonix  daily.      COPD with chronic bronchitis and emphysema     Followed by Pulmonology. Well controlled with Albuterol BID and Trelegy-Ellipta daily.      Prostate cancer screening   Other Visit Diagnoses     Subclinical hypothyroidism       Relevant Orders   TSH       Return in about 6 months (around 07/11/2023).   Park Meo, FNP

## 2023-01-09 NOTE — Assessment & Plan Note (Addendum)
Chronic. Followed by Cardiology twice a year. Well controlled on HCTZ 12.5mg  daily and Lisinopril  daily. He is on statin and ASA.

## 2023-01-10 ENCOUNTER — Other Ambulatory Visit: Payer: Self-pay | Admitting: Orthopedic Surgery

## 2023-01-10 ENCOUNTER — Ambulatory Visit
Admission: RE | Admit: 2023-01-10 | Discharge: 2023-01-10 | Disposition: A | Discharge status: Home / Self Care | Payer: Medicare Other | Source: Ambulatory Visit | Attending: Orthopedic Surgery | Admitting: Orthopedic Surgery

## 2023-01-10 DIAGNOSIS — S22080A Wedge compression fracture of T11-T12 vertebra, initial encounter for closed fracture: Secondary | ICD-10-CM

## 2023-01-10 DIAGNOSIS — M8008XA Age-related osteoporosis with current pathological fracture, vertebra(e), initial encounter for fracture: Secondary | ICD-10-CM

## 2023-01-10 DIAGNOSIS — S32010A Wedge compression fracture of first lumbar vertebra, initial encounter for closed fracture: Secondary | ICD-10-CM

## 2023-01-10 HISTORY — PX: IR KYPHO LUMBAR INC FX REDUCE BONE BX UNI/BIL CANNULATION INC/IMAGING: IMG5519

## 2023-01-10 HISTORY — PX: IR KYPHO THORACIC WITH BONE BIOPSY: IMG5518

## 2023-01-10 LAB — LIPID PANEL
Cholesterol: 164 mg/dL (ref <200)
HDL: 90 mg/dL (ref >=40)
LDL Cholesterol (Calc): 60 mg/dL (calc)
Non-HDL Cholesterol (Calc): 74 mg/dL (calc) (ref <130)
Total CHOL/HDL Ratio: 1.8 (calc) (ref <5.0)
Triglycerides: 63 mg/dL (ref <150)

## 2023-01-10 LAB — TSH: TSH: 1.55 mIU/L (ref 0.40–4.50)

## 2023-01-10 MED ORDER — CEFAZOLIN SODIUM-DEXTROSE 2-4 GM/100ML-% IV SOLN
2.0000 g | INTRAVENOUS | Status: AC
  Administered 2023-01-10: 2 g via INTRAVENOUS

## 2023-01-10 MED ORDER — KETOROLAC TROMETHAMINE 30 MG/ML IJ SOLN: 30.0000 mg | Freq: Once | INTRAMUSCULAR | Status: DC

## 2023-01-10 MED ORDER — MIDAZOLAM HCL 2 MG/2ML IJ SOLN
1.0000 mg | INTRAMUSCULAR | Status: DC | PRN
  Administered 2023-01-10 (×4): 1 mg via INTRAVENOUS

## 2023-01-10 MED ORDER — SODIUM CHLORIDE 0.9 % IV SOLN: INTRAVENOUS | Status: DC

## 2023-01-10 MED ORDER — FENTANYL CITRATE PF 50 MCG/ML IJ SOSY
25.0000 ug | PREFILLED_SYRINGE | INTRAMUSCULAR | Status: DC | PRN
  Administered 2023-01-10 (×4): 50 ug via INTRAVENOUS

## 2023-01-11 NOTE — Addendum Note (Signed)
Addended by: Park Meo on: 01/11/2023 03:57 PM   Modules accepted: Level of Service

## 2023-01-15 ENCOUNTER — Other Ambulatory Visit: Payer: Self-pay | Admitting: Acute Care

## 2023-01-15 DIAGNOSIS — Z122 Encounter for screening for malignant neoplasm of respiratory organs: Secondary | ICD-10-CM

## 2023-01-15 DIAGNOSIS — Z87891 Personal history of nicotine dependence: Secondary | ICD-10-CM

## 2023-01-17 ENCOUNTER — Telehealth: Payer: Self-pay

## 2023-01-17 ENCOUNTER — Other Ambulatory Visit: Payer: Self-pay | Admitting: Interventional Radiology

## 2023-01-17 DIAGNOSIS — Z09 Encounter for follow-up examination after completed treatment for conditions other than malignant neoplasm: Secondary | ICD-10-CM

## 2023-01-17 NOTE — Telephone Encounter (Signed)
Phone call to pt to follow up from her kyphoplasty on 08/25/21. Pt reports her pain is completely gone post procedure but is still having "a little soreness". Pt reports she is able to move around a little better. Pt denies any signs of infection, redness at the site, draining or fever. Pt has no complaints at this time and will be scheduled for a telephone follow up with Dr. McCullough next week. Pt advised to call back if anything were to change or any concerns arise and we will arrange an in person appointment. Pt verbalized understanding.  

## 2023-01-18 ENCOUNTER — Telehealth: Payer: Self-pay | Admitting: Orthopedic Surgery

## 2023-01-18 NOTE — Telephone Encounter (Signed)
Patient had kyphoplasty on 4/23/204. He is having muscle tightness/stiffness more in the morning when he is getting up. He is asking for a muscle relaxer. He called [facility] yesterday and he was told by the nurse to call [facility] because [facility] does not prescribe medication.  [facility]

## 2023-01-19 MED ORDER — METHOCARBAMOL 500 MG PO TABS: 500.0000 mg | ORAL_TABLET | Freq: Three times a day (TID) | ORAL | 0 refills | Status: DC | PRN

## 2023-01-19 NOTE — Telephone Encounter (Signed)
Patient has been notified

## 2023-01-24 ENCOUNTER — Other Ambulatory Visit: Payer: Self-pay | Admitting: Family Medicine

## 2023-01-25 ENCOUNTER — Other Ambulatory Visit: Payer: Self-pay | Admitting: Internal Medicine

## 2023-01-26 ENCOUNTER — Ambulatory Visit
Admission: RE | Admit: 2023-01-26 | Discharge: 2023-01-26 | Disposition: A | Discharge status: Home / Self Care | Payer: Medicare Other | Source: Ambulatory Visit | Attending: Interventional Radiology | Admitting: Interventional Radiology

## 2023-01-26 DIAGNOSIS — Z09 Encounter for follow-up examination after completed treatment for conditions other than malignant neoplasm: Secondary | ICD-10-CM

## 2023-01-26 NOTE — Telephone Encounter (Signed)
Theophylline refilled [place]

## 2023-01-26 NOTE — Progress Notes (Signed)
Chief Complaint: Patient was consulted remotely today (TeleHealth) for T12 and L1 fractures at the request of Marsha Gundlach K.    Referring Physician(s): Luna,Stacy   History of Present Illness: [name] is a 67 y.o. male Who presented with 2 nonhealing and progressive vertebral compression fractures at T12 and L1.  Due to persistent significant symptoms and failure of conservative therapy, he underwent percutaneous cement augmentation with balloon kyphoplasty on 4/23.  The procedure went well and he was discharged home the same day in good condition.  We spoke over the phone today for his 2-week follow-up.  He is extremely pleased to report that he is essentially pain-free in his back for the first time in nearly 20 years.  His improvement is remarkable and he is very happy.  He was having some issues last week with muscle spasms in the paraspinal muscles bilaterally.  Dr. Doy Mince prescribed him muscle relaxers which resolved the spasming.  Unfortunately, he has continued muscular soreness in the areas that were spasming previously although this is significantly better this week compared to last.  He has no other active complaints.  Past Medical History:  Diagnosis Date   COPD (chronic obstructive pulmonary disease) (HCC)    COPD (chronic obstructive pulmonary disease) (HCC)    GERD (gastroesophageal reflux disease)    History of hepatitis C    treated several years ago through [facility]   Hyperlipidemia    Hypertension    OSA (obstructive sleep apnea)     Past Surgical History:  Procedure Laterality Date   CATARACT EXTRACTION, BILATERAL     EYE SURGERY Bilateral    IR KYPHO LUMBAR INC FX REDUCE BONE BX UNI/BIL CANNULATION INC/IMAGING  01/10/2023   IR KYPHO THORACIC WITH BONE BIOPSY  01/10/2023   IR RADIOLOGIST EVAL & MGMT  12/29/2022   IR THORACENTESIS ASP PLEURAL SPACE W/IMG GUIDE  09/14/2021   IR THORACENTESIS ASP PLEURAL SPACE W/IMG GUIDE  09/21/2021   ORIF PELVIC  FRACTURE WITH PERCUTANEOUS SCREWS Right 07/29/2021   Procedure: SI SCREW FIXATION OF RIGHT PELVIC RING;  Surgeon: Myrene Galas, MD;  Location: [facility] OR;  Service: Orthopedics;  Laterality: Right;   ORTHOPEDIC SURGERY Bilateral    pt states fracture repairs to arms and legs.   REVERSE SHOULDER ARTHROPLASTY Left 09/23/2019   Procedure: REVERSE SHOULDER ARTHROPLASTY;  Surgeon: Jones Broom, MD;  Location: [facility] ORS;  Service: Orthopedics;  Laterality: Left;   TRACHEOSTOMY TUBE PLACEMENT N/A 08/10/2021   Procedure: TRACHEOSTOMY;  Surgeon: Violeta Gelinas, MD;  Location: [facility] OR;  Service: General;  Laterality: N/A;    Allergies: Patient has no known allergies.  Medications: Prior to Admission medications   Medication Sig Start Date End Date Taking? Authorizing Provider  methocarbamol (ROBAXIN) 500 MG tablet Take 1 tablet (500 mg total) by mouth every 8 (eight) hours as needed for muscle spasms. 01/19/23   Susanne Borders, PA  theophylline (UNIPHYL) 400 MG 24 hr tablet TAKE ONE HALF (1/2) TABLET BY MOUTH TWICE A DAY WITH FOOD. 01/26/23   Jetty Duhamel D, MD  albuterol (PROVENTIL) (2.5 MG/3ML) 0.083% nebulizer solution Take 2.5 mg by nebulization every 6 (six) hours as needed for wheezing or shortness of breath.    [provider]  albuterol (VENTOLIN HFA) 108 (90 Base) MCG/ACT inhaler Inhale 2 puffs into the lungs every 4 (four) hours as needed for wheezing or shortness of breath.    [provider]  aspirin EC 81 MG tablet Take 81 mg by mouth  daily.    [provider]  atorvastatin (LIPITOR) 40 MG tablet TAKE 1 TABLET BY MOUTH ONCE DAILY 05/06/22   O'Neal, Ronnald Ramp, MD  Cholecalciferol (VITAMIN D3) 25 MCG (1000 UT) CAPS Give 2 by mouth once daily.    [provider]  fluocinonide (LIDEX) 0.05 % external solution Apply 1 Application topically 2 (two) times daily as needed (skin irritation). 09/27/22   [provider]  Fluticasone-Umeclidin-Vilant  (TRELEGY ELLIPTA) 100-62.5-25 MCG/ACT AEPB INHALE 1 PUFF INTO THE LUNGS EVERY DAY AT THE SAME TIME EACH DAY 06/16/22   Jetty Duhamel D, MD  hydrochlorothiazide (HYDRODIURIL) 12.5 MG tablet Take 12.5 mg by mouth daily.    [provider]  lisinopril (ZESTRIL) 10 MG tablet Take 1 tablet (10 mg total) by mouth daily. 10/14/22   Sheikh, Omair Latif, DO  meloxicam (MOBIC) 15 MG tablet TAKE ONE TABLET (15 MG TOTAL) BY MOUTH DAILY. TAKE WITH FOOD. 12/14/22   Venetia Night, MD  metoprolol succinate (TOPROL-XL) 25 MG 24 hr tablet Take 25 mg by mouth daily. 09/30/22   [provider]  Multiple Vitamins-Minerals (MULTI FOR HIM 50+ PO) Take 1 tablet by mouth daily.    [provider]  OXYGEN Inhale 3 L into the lungs at bedtime.    [provider]  pantoprazole sodium (PROTONIX) 40 mg Take 40 mg by mouth daily.    [provider]  tamsulosin (FLOMAX) 0.4 MG CAPS capsule Take 0.4 mg by mouth daily. 03/18/22   [provider]  theophylline (THEO-24) 200 MG 24 hr capsule Take 200 mg by mouth 2 (two) times daily.    [provider]     Family History  Problem Relation Age of Onset   Heart disease Mother    Hypertension Father     Social History   Socioeconomic History   Marital status: Single    Spouse name: Not on file   Number of children: 2   Years of education: Not on file   Highest education level: Not on file  Occupational History   Occupation: Holiday representative  Tobacco Use   Smoking status: Former    Packs/day: 1.00    Years: 35.00    Additional pack years: 0.00    Total pack years: 35.00    Types: Cigarettes    Start date: 77    Quit date: 10/01/2010    Years since quitting: 12.3   Smokeless tobacco: Never  Vaping Use   Vaping Use: Never used  Substance and Sexual Activity   Alcohol use: Yes    Alcohol/week: 42.0 standard drinks of alcohol    Types: 42 Cans of beer per week    Comment: daily - 6 drinks a day   Drug  use: Never    Comment: last marijuana 2-3 weeks ago   Sexual activity: Not Currently  Other Topics Concern   Not on file  Social History Narrative   Not on file   Social Determinants of Health   Financial Resource Strain: Not on file  Food Insecurity: No Food Insecurity (10/03/2022)   Hunger Vital Sign    Worried About Running Out of Food in the Last Year: Never true    Ran Out of Food in the Last Year: Never true  Transportation Needs: No Transportation Needs (10/03/2022)   PRAPARE - Administrator, Civil Service (Medical): No    Lack of Transportation (Non-Medical): No  Physical Activity: Not on file  Stress: Not on file  Social  Connections: Not on file     Review of Systems  Review of Systems: A 12 point ROS discussed and pertinent positives are indicated in the HPI above.  All other systems are negative.   Physical Exam No direct physical exam was performed (except for noted visual exam findings with Video Visits).   Vital Signs: There were no vitals taken for this visit.  Imaging: DG Radiologist Eval And Mgmt  Result Date: 01/26/2023 EXAM: ESTABLISHED PATIENT OFFICE VISIT CHIEF COMPLAINT: SEE EPIC NOTE HISTORY OF PRESENT ILLNESS: SEE EPIC NOTE REVIEW OF SYSTEMS: SEE EPIC NOTE PHYSICAL EXAMINATION: SEE EPIC NOTE ASSESSMENT AND PLAN: SEE EPIC NOTE Electronically Signed   By: Malachy Moan M.D.   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 He presents for percutaneous cement augmentation with balloon kyphoplasty. EXAM: FLUOROSCOPIC GUIDED KYPHOPLASTY OF THE T12 VERTEBRAL BODY FLUOROSCOPIC GUIDED KYPHOPLASTY OF THE L1 VERTEBRAL BODY COMPARISON:  MRI of the lumbar spine 12/15/2022 MEDICATIONS: As antibiotic prophylaxis, Ancef 2 g was ordered pre-procedure and administered intravenously within 1 hour of  incision. 30 mg Toradol administered intramuscularly. ANESTHESIA/SEDATION: Moderate (conscious) sedation was employed during this procedure. A total of Versed 4 mg and Fentanyl 200 mcg was administered intravenously. Moderate Sedation Time: 42 minutes. The patient's level of consciousness and vital signs were monitored continuously by radiology nursing throughout the procedure under my direct supervision. FLUOROSCOPY TIME:  Radiation exposure index: 118.5 mGy reference air kerma COMPLICATIONS: None immediate. PROCEDURE: The procedure, risks (including but not limited to bleeding, infection, organ damage), benefits, and alternatives were explained to the patient. Questions regarding the procedure were encouraged and answered. The patient understands and consents to the procedure. The patient has suffered fractures of the T12 and L1 vertebral bodies. L1 The patient was placed prone on the fluoroscopic table. The skin overlying the thoracolumbar region was then prepped and draped in the usual sterile fashion. Maximal barrier sterile technique was utilized including caps, mask, sterile gowns, sterile gloves, sterile drape, hand hygiene and skin antiseptic. Intravenous Fentanyl and Versed were administered as conscious sedation during continuous cardiorespiratory monitoring by the radiology RN. The left pedicle at L1 was then infiltrated with 1% lidocaine followed by the advancement of a Kyphon trocar needle through the left pedicle into the posterior one-third of the vertebral body. Subsequently, the osteo drill was advanced to the anterior third of the vertebral body. The osteo drill was retracted. Through the working cannula, a Kyphon inflatable bone tamp 15 x 2.5 was advanced and positioned with the distal marker approximately 5 mm from the anterior aspect of the cortex. Appropriate positioning was confirmed on the AP projection. At this time, the balloon was expanded using contrast via a Kyphon inflation syringe  device via micro tubing. In similar fashion, the right L1 pedicle was infiltrated with 1% lidocaine followed by the advancement of a second Kyphon trocar needle through the right pedicle into the posterior third of the vertebral body. Subsequently, the osteo drill was coaxially advanced to the anterior right third. The osteo drill was exchanged for a Kyphon inflatable bone tamp 15 x 2.5, advanced to the 5 mm of the anterior aspect of the cortex. The balloon was then expanded using contrast as above. Inflations were continued until there was near apposition with the superior end plate. At this time, methylmethacrylate mixture was reconstituted in the Kyphon bone mixing  device system. This was then loaded into the delivery mechanism and attached to Kyphon bone fillers. The balloons were deflated and removed followed by the instillation of methylmethacrylate mixture with excellent filling in the AP and lateral projections. No extravasation was noted in the disk spaces or posteriorly into the spinal canal. No epidural venous contamination was seen. T12 The left pedicle at T12 was then infiltrated with 1% lidocaine followed by the advancement of a Kyphon trocar needle through the left pedicle into the posterior one-third of the vertebral body. Subsequently, the osteo drill was advanced to the anterior third of the vertebral body. The osteo drill was retracted. Through the working cannula, a Kyphon inflatable bone tamp 15 x 2.5 was advanced and positioned with the distal marker approximately 5 mm from the anterior aspect of the cortex. Appropriate positioning was confirmed on the AP projection. At this time, the balloon was expanded using contrast via a Kyphon inflation syringe device via micro tubing. Inflations were continued until there was near apposition with the superior end plate. At this time, methylmethacrylate mixture was reconstituted in the Kyphon bone mixing device system. This was then loaded into the  delivery mechanism and attached to Kyphon bone fillers. The balloons were deflated and removed followed by the instillation of methylmethacrylate mixture with excellent filling in the AP and lateral projections. No extravasation was noted in the disk spaces or posteriorly into the spinal canal. No epidural venous contamination was seen. The working cannulae and the bone filler were then retrieved and removed. Hemostasis was achieved with manual compression. The patient tolerated the procedure well without immediate postprocedural complication. IMPRESSION: 1. Technically successful T12 vertebral body augmentation using balloon kyphoplasty. 2. Technically successful L1 vertebral body augmentation using balloon kyphoplasty. 3. Per CMS PQRS reporting requirements (PQRS Measure 24): Given the patient's age of greater than 50 and the fracture site (hip, distal radius, or spine), the patient should be tested for osteoporosis using DXA, and the appropriate treatment considered based on the DXA results. Electronically Signed   By: Malachy Moan M.D.   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 He presents for percutaneous cement augmentation with balloon kyphoplasty. EXAM: FLUOROSCOPIC GUIDED KYPHOPLASTY OF THE T12 VERTEBRAL BODY FLUOROSCOPIC GUIDED KYPHOPLASTY OF THE L1 VERTEBRAL BODY COMPARISON:  MRI of the lumbar spine 12/15/2022 MEDICATIONS: As antibiotic prophylaxis, Ancef 2 g was ordered pre-procedure and administered intravenously within 1 hour of incision. 30 mg Toradol administered intramuscularly. ANESTHESIA/SEDATION: Moderate (conscious) sedation was employed during this procedure. A total of Versed 4 mg and Fentanyl 200 mcg was administered intravenously. Moderate Sedation Time: 42 minutes. The patient's level of  consciousness and vital signs were monitored continuously by radiology nursing throughout the procedure under my direct supervision. FLUOROSCOPY TIME:  Radiation exposure index: 118.5 mGy reference air kerma COMPLICATIONS: None immediate. PROCEDURE: The procedure, risks (including but not limited to bleeding, infection, organ damage), benefits, and alternatives were explained to the patient. Questions regarding the procedure were encouraged and answered. The patient understands and consents to the procedure. The patient has suffered fractures of the T12 and L1 vertebral bodies. L1 The patient was placed prone on the fluoroscopic table. The skin overlying the thoracolumbar region was then prepped and draped in the usual sterile fashion. Maximal barrier sterile technique was utilized including caps, mask, sterile gowns, sterile gloves,  sterile drape, hand hygiene and skin antiseptic. Intravenous Fentanyl and Versed were administered as conscious sedation during continuous cardiorespiratory monitoring by the radiology RN. The left pedicle at L1 was then infiltrated with 1% lidocaine followed by the advancement of a Kyphon trocar needle through the left pedicle into the posterior one-third of the vertebral body. Subsequently, the osteo drill was advanced to the anterior third of the vertebral body. The osteo drill was retracted. Through the working cannula, a Kyphon inflatable bone tamp 15 x 2.5 was advanced and positioned with the distal marker approximately 5 mm from the anterior aspect of the cortex. Appropriate positioning was confirmed on the AP projection. At this time, the balloon was expanded using contrast via a Kyphon inflation syringe device via micro tubing. In similar fashion, the right L1 pedicle was infiltrated with 1% lidocaine followed by the advancement of a second Kyphon trocar needle through the right pedicle into the posterior third of the vertebral body. Subsequently, the osteo drill was coaxially  advanced to the anterior right third. The osteo drill was exchanged for a Kyphon inflatable bone tamp 15 x 2.5, advanced to the 5 mm of the anterior aspect of the cortex. The balloon was then expanded using contrast as above. Inflations were continued until there was near apposition with the superior end plate. At this time, methylmethacrylate mixture was reconstituted in the Kyphon bone mixing device system. This was then loaded into the delivery mechanism and attached to Kyphon bone fillers. The balloons were deflated and removed followed by the instillation of methylmethacrylate mixture with excellent filling in the AP and lateral projections. No extravasation was noted in the disk spaces or posteriorly into the spinal canal. No epidural venous contamination was seen. T12 The left pedicle at T12 was then infiltrated with 1% lidocaine followed by the advancement of a Kyphon trocar needle through the left pedicle into the posterior one-third of the vertebral body. Subsequently, the osteo drill was advanced to the anterior third of the vertebral body. The osteo drill was retracted. Through the working cannula, a Kyphon inflatable bone tamp 15 x 2.5 was advanced and positioned with the distal marker approximately 5 mm from the anterior aspect of the cortex. Appropriate positioning was confirmed on the AP projection. At this time, the balloon was expanded using contrast via a Kyphon inflation syringe device via micro tubing. Inflations were continued until there was near apposition with the superior end plate. At this time, methylmethacrylate mixture was reconstituted in the Kyphon bone mixing device system. This was then loaded into the delivery mechanism and attached to Kyphon bone fillers. The balloons were deflated and removed followed by the instillation of methylmethacrylate mixture with excellent filling in the AP and lateral projections. No extravasation was noted in the disk spaces or posteriorly into the  spinal canal. No epidural venous contamination was seen. The working cannulae and the bone filler were then retrieved and removed. Hemostasis was achieved with manual compression. The patient tolerated the procedure well without immediate postprocedural complication. IMPRESSION: 1. Technically successful T12 vertebral body augmentation using balloon kyphoplasty. 2. Technically successful L1 vertebral body augmentation using balloon kyphoplasty. 3. Per CMS PQRS reporting requirements (PQRS Measure 24): Given the patient's age of greater than 50 and the fracture site (hip, distal radius, or spine), the patient should be tested for osteoporosis using DXA, and the appropriate treatment considered based on the DXA results. Electronically Signed   By: Malachy Moan M.D.   On: 01/10/2023 11:35   IR Radiologist  Eval & Mgmt  Result Date: 12/29/2022 EXAM: NEW PATIENT OFFICE VISIT CHIEF COMPLAINT: SEE EPIC NOTE HISTORY OF PRESENT ILLNESS: SEE EPIC NOTE REVIEW OF SYSTEMS: SEE EPIC NOTE PHYSICAL EXAMINATION: SEE EPIC NOTE ASSESSMENT AND PLAN: SEE EPIC NOTE Electronically Signed   By: Malachy Moan M.D.   On: 12/29/2022 10:14    Labs:  CBC: Recent Labs    10/12/22 0322 10/13/22 0201 10/14/22 0201 12/29/22 0845  WBC 7.6 6.8 6.6 9.0  HGB 13.4 12.7* 13.2 15.1  HCT 37.7* 35.9* 38.0* 44.7  PLT 256 254 245 245    COAGS: No results for input(s): "INR", "APTT" in the last 8760 hours.  BMP: Recent Labs    10/11/22 0303 10/12/22 0322 10/13/22 0201 10/14/22 0201 12/29/22 0845  NA 133* 136 133* 135 136  K 3.2* 3.2* 3.6 3.7 3.9  CL 102 106 104 106 104  CO2 21* 23 22 21* 16*  GLUCOSE 125* 110* 108* 104* 108  BUN 18 13 9  7* 9  CALCIUM 8.7* 8.9 8.6* 9.0 10.1  CREATININE 0.75 0.70 0.69 0.68 0.61*  GFRNONAA >60 >60 >60 >60  --     LIVER FUNCTION TESTS: Recent Labs    10/08/22 0340 10/09/22 0356 10/10/22 0053 10/13/22 0201 12/29/22 0845  BILITOT 0.9 0.9 0.9 0.5 0.4  AST 29 37 29 32 25   ALT 29 38 39 36 16  ALKPHOS 88 98 96 98  --   PROT 6.2* 7.4 7.0 6.5 7.6  ALBUMIN 3.4* 3.9 3.6 3.2*  --     TUMOR MARKERS: No results for input(s): "AFPTM", "CEA", "CA199", "CHROMGRNA" in the last 8760 hours.  Assessment and Plan:  Pleasant 67 year old gentleman now 2 weeks status post T12 and L1 vertebral augmentation with balloon kyphoplasty.  He is pleased to report that his back pain is essentially completely resolved.  He is nearly pain-free for the first time in 20 years.  He was having some persistent issues with muscle spasming for which he took muscle relaxers prescribed by his neurosurgery office.  The spasms have since resolved but he has some persistent muscle soreness where the muscles were spasming.  This appears to be improving on its own.  1.) Heating pad or hot water bottle to sore muscles.  Also consider gentle stretching and massage.   2.) No further scheduled follow-up.      Electronically Signed: Sterling Big 01/26/2023, 4:29 PM   I spent a total of  15 Minutes in remote  clinical consultation, greater than 50% of which was counseling/coordinating care for T12 and L1 fractures.    Visit type: Audio only (telephone). Audio (no video) only due to patient preference. Alternative for in-person consultation at [facility], [address], [place], [place]. This visit type was conducted due to national recommendations for restrictions regarding the COVID-19 Pandemic (e.g. social distancing).  This format is felt to be most appropriate for this patient at this time.  All issues noted in this document were discussed and addressed.

## 2023-01-27 ENCOUNTER — Telehealth: Payer: PRIVATE HEALTH INSURANCE | Admitting: Orthopedic Surgery

## 2023-01-30 ENCOUNTER — Telehealth: Payer: Self-pay | Admitting: Orthopedic Surgery

## 2023-01-30 DIAGNOSIS — S22080S Wedge compression fracture of T11-T12 vertebra, sequela: Secondary | ICD-10-CM

## 2023-01-30 DIAGNOSIS — S32010A Wedge compression fracture of first lumbar vertebra, initial encounter for closed fracture: Secondary | ICD-10-CM

## 2023-01-30 MED ORDER — CYCLOBENZAPRINE HCL 10 MG PO TABS
10.0000 mg | ORAL_TABLET | Freq: Three times a day (TID) | ORAL | 0 refills | Status: DC | PRN
Start: 2023-01-30 — End: 2023-02-27

## 2023-01-30 NOTE — Telephone Encounter (Signed)
If robaxin is not helping, we can try flexeril. Does he want to do that?

## 2023-01-30 NOTE — Addendum Note (Signed)
Addended byDrake Leach on: 01/30/2023 05:28 PM   Modules accepted: Orders

## 2023-01-30 NOTE — Telephone Encounter (Signed)
I spoke with [name] and relayed Stacy's recommendations. Appt made for 02/16/23 with Stacy.

## 2023-01-30 NOTE — Telephone Encounter (Signed)
Have him stop the robaxin and take the flexeril as needed for muscle spasms.   Let him know that this can make him sleepy.   Please make him a f/u with me in clinic in 2-3 weeks for recheck.

## 2023-01-30 NOTE — Telephone Encounter (Signed)
Pt called today, complaining of back spasm. Was prescribed Robaxin 05/01 but says it is not working at all. Pain 10/10. Says it makes it hard to breath when the spasm occurs. Muscles on either side of the site where the kyphoplasty was done are feeling tight.   CB: [phone]

## 2023-01-31 ENCOUNTER — Telehealth: Payer: Self-pay | Admitting: Family Medicine

## 2023-01-31 ENCOUNTER — Other Ambulatory Visit: Payer: Self-pay | Admitting: Family Medicine

## 2023-01-31 NOTE — Telephone Encounter (Signed)
Contacted [name] to schedule their annual wellness visit. Welcome to Medicare visit Due by 04/20/2023.  Thank you,  [facility] Support [facility] Direct dial  [phone]

## 2023-02-09 ENCOUNTER — Ambulatory Visit
Admission: RE | Admit: 2023-02-09 | Discharge: 2023-02-09 | Disposition: A | Discharge status: Home / Self Care | Payer: Medicare Other | Source: Ambulatory Visit | Attending: Family Medicine | Admitting: Family Medicine

## 2023-02-09 DIAGNOSIS — Z122 Encounter for screening for malignant neoplasm of respiratory organs: Secondary | ICD-10-CM

## 2023-02-09 DIAGNOSIS — Z87891 Personal history of nicotine dependence: Secondary | ICD-10-CM

## 2023-02-14 ENCOUNTER — Encounter: Payer: Self-pay | Admitting: Internal Medicine

## 2023-02-14 ENCOUNTER — Other Ambulatory Visit: Payer: Self-pay | Admitting: Neurosurgery

## 2023-02-15 ENCOUNTER — Other Ambulatory Visit: Payer: Self-pay

## 2023-02-15 MED ORDER — PREDNISONE 10 MG PO TABS: ORAL_TABLET | ORAL | 0 refills | Status: DC

## 2023-02-15 MED ORDER — AZITHROMYCIN 250 MG PO TABS: ORAL_TABLET | ORAL | 0 refills | Status: DC

## 2023-02-15 NOTE — Progress Notes (Deleted)
Referring Physician:  Park Meo, FNP [id number] [place],  [place] [address]  Primary Physician:  Park Meo, FNP  History of Present Illness: 02/15/2023 [name] has a history of HTN, hyperlipidemia, COPD, hep C, alcohol use disorder (drinks 4-20 beers per day), DM, macular degeneration, GERD, CAD, OSA, and anxiety.   History of chronic thoracic compression fractures, chronic pubic rami fractures, right iliac bone fracture, and multiple old right rib fractures.   Last seen by me on 12/01/22 for L1 compression fracture s/p fall on 10/01/22. He was referred to IR and had kyphoplasty of T12 and L1 on 01/10/23 with Dr. Archer Asa.   He called with muscle spasms in his back and was given robaxin. This was changed to flexeril.   He is here for follow up.      Get updated lumbar xrays?***        He is taking lyrica, oxycodone, mobic, and flexeril.   History of pelvic fracture with fixation.   No bowel or bladder issues.   Conservative measures:  Physical therapy: did PT at SNF. Is back home.  Multimodal medical therapy including regular antiinflammatories: flexeril, motrin, lidoderm patches,  lyrica Injections: No epidural steroid injections  Past Surgery:  T12 and L1 kyphoplasty on 01/10/23 previous pelvic fixation 2 years ago   Review of Systems:  A 10 point review of systems is negative, except for the pertinent positives and negatives detailed in the HPI.  Past Medical History: Past Medical History:  Diagnosis Date   COPD (chronic obstructive pulmonary disease) (HCC)    COPD (chronic obstructive pulmonary disease) (HCC)    GERD (gastroesophageal reflux disease)    History of hepatitis C    treated several years ago through [facility]   Hyperlipidemia    Hypertension    OSA (obstructive sleep apnea)     Past Surgical History: Past Surgical History:  Procedure Laterality Date   CATARACT EXTRACTION, BILATERAL     EYE SURGERY Bilateral    IR  KYPHO LUMBAR INC FX REDUCE BONE BX UNI/BIL CANNULATION INC/IMAGING  01/10/2023   IR KYPHO THORACIC WITH BONE BIOPSY  01/10/2023   IR RADIOLOGIST EVAL & MGMT  12/29/2022   IR THORACENTESIS ASP PLEURAL SPACE W/IMG GUIDE  09/14/2021   IR THORACENTESIS ASP PLEURAL SPACE W/IMG GUIDE  09/21/2021   ORIF PELVIC FRACTURE WITH PERCUTANEOUS SCREWS Right 07/29/2021   Procedure: SI SCREW FIXATION OF RIGHT PELVIC RING;  Surgeon: Myrene Galas, MD;  Location: [facility] OR;  Service: Orthopedics;  Laterality: Right;   ORTHOPEDIC SURGERY Bilateral    pt states fracture repairs to arms and legs.   REVERSE SHOULDER ARTHROPLASTY Left 09/23/2019   Procedure: REVERSE SHOULDER ARTHROPLASTY;  Surgeon: Jones Broom, MD;  Location: [facility] ORS;  Service: Orthopedics;  Laterality: Left;   TRACHEOSTOMY TUBE PLACEMENT N/A 08/10/2021   Procedure: TRACHEOSTOMY;  Surgeon: Violeta Gelinas, MD;  Location: [facility] OR;  Service: General;  Laterality: N/A;    Allergies: Allergies as of 02/16/2023   (No Known Allergies)    Medications: Outpatient Encounter Medications as of 02/16/2023  Medication Sig   theophylline (UNIPHYL) 400 MG 24 hr tablet TAKE ONE HALF (1/2) TABLET BY MOUTH TWICE A DAY WITH FOOD.   albuterol (PROVENTIL) (2.5 MG/3ML) 0.083% nebulizer solution Take 2.5 mg by nebulization every 6 (six) hours as needed for wheezing or shortness of breath.   albuterol (VENTOLIN HFA) 108 (90 Base) MCG/ACT inhaler Inhale 2 puffs into the lungs every 4 (four) hours  as needed for wheezing or shortness of breath.   aspirin EC 81 MG tablet Take 81 mg by mouth daily.   atorvastatin (LIPITOR) 40 MG tablet TAKE 1 TABLET BY MOUTH ONCE DAILY   Cholecalciferol (VITAMIN D3) 25 MCG (1000 UT) CAPS Give 2 by mouth once daily.   cyclobenzaprine (FLEXERIL) 10 MG tablet Take 1 tablet (10 mg total) by mouth 3 (three) times daily as needed for muscle spasms. This can make you sleepy.   fluocinonide (LIDEX) 0.05 % external solution Apply 1 Application  topically 2 (two) times daily as needed (skin irritation).   Fluticasone-Umeclidin-Vilant (TRELEGY ELLIPTA) 100-62.5-25 MCG/ACT AEPB INHALE 1 PUFF INTO THE LUNGS EVERY DAY AT THE SAME TIME EACH DAY   hydrochlorothiazide (HYDRODIURIL) 12.5 MG tablet Take 12.5 mg by mouth daily.   lisinopril (ZESTRIL) 10 MG tablet Take 1 tablet (10 mg total) by mouth daily.   meloxicam (MOBIC) 15 MG tablet TAKE ONE TABLET (15 MG TOTAL) BY MOUTH DAILY. TAKE WITH FOOD.   metoprolol succinate (TOPROL-XL) 25 MG 24 hr tablet Take 25 mg by mouth daily.   Multiple Vitamins-Minerals (MULTI FOR HIM 50+ PO) Take 1 tablet by mouth daily.   OXYGEN Inhale 3 L into the lungs at bedtime.   pantoprazole sodium (PROTONIX) 40 mg Take 40 mg by mouth daily.   tamsulosin (FLOMAX) 0.4 MG CAPS capsule Take 0.4 mg by mouth daily.   theophylline (THEO-24) 200 MG 24 hr capsule Take 200 mg by mouth 2 (two) times daily.   No facility-administered encounter medications on file as of 02/16/2023.    Social History: Social History   Tobacco Use   Smoking status: Former    Packs/day: 1.00    Years: 35.00    Additional pack years: 0.00    Total pack years: 35.00    Types: Cigarettes    Start date: 23    Quit date: 10/01/2010    Years since quitting: 12.3   Smokeless tobacco: Never  Vaping Use   Vaping Use: Never used  Substance Use Topics   Alcohol use: Yes    Alcohol/week: 42.0 standard drinks of alcohol    Types: 42 Cans of beer per week    Comment: daily - 6 drinks a day   Drug use: Never    Comment: last marijuana 2-3 weeks ago    Family Medical History: Family History  Problem Relation Age of Onset   Heart disease Mother    Hypertension Father     Physical Examination: There were no vitals filed for this visit.    Awake, alert, oriented to person, place, and time.  Speech is clear and fluent. Fund of knowledge is appropriate.   Cranial Nerves: Pupils equal round and reactive to light.  Facial tone is symmetric.     ROM of lumbar spine not tested. ***  No abnormal lesions on exposed skin.   Strength:  Side Iliopsoas Quads Hamstring PF DF EHL  R 5 5 5 5 5 5   L 5 5 5 5 5 5    Clonus is not present.   Bilateral upper and lower extremity sensation is intact to light touch.     Slow gait with a cane.   Medical Decision Making  Imaging: None   Assessment and Plan: [name] is a pleasant 67 y.o. male who has L1 compression fracture s/p fall on 10/01/22. He has constant mid to low back pain that is getting worse. No leg pain.   Xrays from today show L1  fracture is stable from previous xrays.   Treatment options discussed with patient and following plan made:   - Due to increasing pain in his lower back, I recommend MRI of lumbar spine to further evaluate L1 compression fracture.  - He was a Psychologist, occupational. Will need orbits to clear him for MRI. These were ordered.  - Depending on results of MRI, may consider referral to IR for kyphoplasty.  - Continue with TLSO brace. No bending, twisting, or lifting.  - Recommend he continue on lyrica and oxycodone prn.  - Given refill of oxycodone. Directions changed to q 6 hours prn. Reviewed dosing and side effects. PMP reviewed and is appropriate.  - Will do phone visit to review MRI results once I have them back.   I spent a total of *** minutes in face-to-face and non-face-to-face activities related to this patient's care today including review of outside records, review of imaging, review of symptoms, physical exam, discussion of differential diagnosis, discussion of treatment options, and documentation.   Drake Leach PA-C Dept. of Neurosurgery

## 2023-02-15 NOTE — Telephone Encounter (Signed)
Prednisone 10 mg, # 20, 4 X 2 DAYS, 3 X 2 DAYS, 2 X 2 DAYS, 1 X 2 DAYS  Zpak   250 mg, # 6, 2 today then one daily 

## 2023-02-16 ENCOUNTER — Other Ambulatory Visit: Payer: Self-pay | Admitting: Orthopedic Surgery

## 2023-02-16 ENCOUNTER — Ambulatory Visit: Payer: Medicare Other | Admitting: Orthopedic Surgery

## 2023-02-16 ENCOUNTER — Telehealth: Payer: Self-pay | Admitting: *Deleted

## 2023-02-16 DIAGNOSIS — S22080S Wedge compression fracture of T11-T12 vertebra, sequela: Secondary | ICD-10-CM

## 2023-02-16 DIAGNOSIS — R918 Other nonspecific abnormal finding of lung field: Secondary | ICD-10-CM

## 2023-02-16 DIAGNOSIS — S32010A Wedge compression fracture of first lumbar vertebra, initial encounter for closed fracture: Secondary | ICD-10-CM

## 2023-02-16 DIAGNOSIS — Z87891 Personal history of nicotine dependence: Secondary | ICD-10-CM

## 2023-02-16 NOTE — Telephone Encounter (Signed)
Called and spoke with patient regarding CT results. New small nodules were noted. Pt does report being sick with respiratory symptoms and is getting ready to start antibiotic and prednisone that was sent to the pharmacy. I encouraged pt to go ahead and start the medication. Pt advised that we will repeat Chest CT in 6 months. Pt verbalized understanding. Results/plans faxed to PCP. Order placed for 6 month nodule f/u CT.

## 2023-02-16 NOTE — Telephone Encounter (Signed)
Refill for flexeril received from pharmacy.   Please call him to see if he actually needs the refill. Also, he had appointment today and did not show up. Please reschedule apppointment with me.

## 2023-02-16 NOTE — Telephone Encounter (Signed)
Left message to call back  

## 2023-02-18 DEATH — deceased

## 2023-06-15 ENCOUNTER — Ambulatory Visit: Payer: PRIVATE HEALTH INSURANCE | Admitting: Internal Medicine

## 2023-08-30 ENCOUNTER — Ambulatory Visit: Payer: PRIVATE HEALTH INSURANCE | Admitting: Dermatology
